# Patient Record
Sex: Female | Born: 1935 | Race: White | Hispanic: No | Smoking: Never smoker
Health system: Southern US, Community
[De-identification: ages and names within clinical notes are randomized; demographics above are authoritative.]

## PROBLEM LIST (undated history)

## (undated) DIAGNOSIS — D649 Anemia, unspecified: Secondary | ICD-10-CM

## (undated) DIAGNOSIS — J189 Pneumonia, unspecified organism: Secondary | ICD-10-CM

## (undated) DIAGNOSIS — N390 Urinary tract infection, site not specified: Secondary | ICD-10-CM

## (undated) DIAGNOSIS — F0280 Dementia in other diseases classified elsewhere without behavioral disturbance: Secondary | ICD-10-CM

## (undated) DIAGNOSIS — E785 Hyperlipidemia, unspecified: Secondary | ICD-10-CM

## (undated) DIAGNOSIS — I4821 Permanent atrial fibrillation: Secondary | ICD-10-CM

## (undated) DIAGNOSIS — K635 Polyp of colon: Secondary | ICD-10-CM

## (undated) DIAGNOSIS — I10 Essential (primary) hypertension: Secondary | ICD-10-CM

## (undated) DIAGNOSIS — J90 Pleural effusion, not elsewhere classified: Secondary | ICD-10-CM

## (undated) DIAGNOSIS — H269 Unspecified cataract: Secondary | ICD-10-CM

## (undated) DIAGNOSIS — I509 Heart failure, unspecified: Secondary | ICD-10-CM

## (undated) DIAGNOSIS — R413 Other amnesia: Secondary | ICD-10-CM

## (undated) DIAGNOSIS — I471 Supraventricular tachycardia: Secondary | ICD-10-CM

## (undated) DIAGNOSIS — I499 Cardiac arrhythmia, unspecified: Secondary | ICD-10-CM

## (undated) HISTORY — PX: UPPER GASTROINTESTINAL ENDOSCOPY: SHX188

## (undated) HISTORY — DX: Cardiac arrhythmia, unspecified: I49.9

## (undated) HISTORY — DX: Hyperlipidemia, unspecified: E78.5

## (undated) HISTORY — DX: Pneumonia, unspecified organism: J18.9

## (undated) HISTORY — DX: Dementia in other diseases classified elsewhere, unspecified severity, without behavioral disturbance, psychotic disturbance, mood disturbance, and anxiety: F02.80

## (undated) HISTORY — DX: Heart failure, unspecified: I50.9

## (undated) HISTORY — DX: Unspecified cataract: H26.9

## (undated) HISTORY — DX: Other amnesia: R41.3

## (undated) HISTORY — DX: Anemia, unspecified: D64.9

## (undated) HISTORY — DX: Supraventricular tachycardia: I47.1

## (undated) HISTORY — DX: Urinary tract infection, site not specified: N39.0

## (undated) HISTORY — DX: Essential (primary) hypertension: I10

## (undated) HISTORY — DX: Polyp of colon: K63.5

## (undated) HISTORY — PX: COLONOSCOPY: SHX174

## (undated) HISTORY — DX: Pleural effusion, not elsewhere classified: J90

---

## 1981-12-29 HISTORY — PX: APPENDECTOMY: SHX54

## 1981-12-29 HISTORY — PX: ABDOMINAL HYSTERECTOMY: SHX81

## 1998-09-26 ENCOUNTER — Ambulatory Visit (HOSPITAL_COMMUNITY): Admission: RE | Admit: 1998-09-26 | Discharge: 1998-09-26 | Payer: Self-pay | Admitting: Obstetrics & Gynecology

## 1999-04-22 ENCOUNTER — Other Ambulatory Visit: Admission: RE | Admit: 1999-04-22 | Discharge: 1999-04-22 | Payer: Self-pay | Admitting: Gynecology

## 2002-12-13 ENCOUNTER — Other Ambulatory Visit: Admission: RE | Admit: 2002-12-13 | Discharge: 2002-12-13 | Payer: Self-pay | Admitting: Gynecology

## 2003-04-10 ENCOUNTER — Encounter: Admission: RE | Admit: 2003-04-10 | Discharge: 2003-05-19 | Payer: Self-pay | Admitting: Orthopedic Surgery

## 2005-04-24 ENCOUNTER — Other Ambulatory Visit: Admission: RE | Admit: 2005-04-24 | Discharge: 2005-04-24 | Payer: Self-pay | Admitting: Gynecology

## 2009-12-29 HISTORY — PX: HEMIARTHROPLASTY HIP: SUR652

## 2010-12-11 ENCOUNTER — Inpatient Hospital Stay (HOSPITAL_COMMUNITY)
Admission: EM | Admit: 2010-12-11 | Discharge: 2010-12-16 | Payer: Self-pay | Source: Home / Self Care | Attending: Orthopedic Surgery | Admitting: Orthopedic Surgery

## 2011-03-10 LAB — PROTIME-INR
INR: 1.11 (ref 0.00–1.49)
INR: 1.19 (ref 0.00–1.49)
INR: 1.34 (ref 0.00–1.49)
INR: 1.35 (ref 0.00–1.49)
INR: 1.41 (ref 0.00–1.49)
Prothrombin Time: 14.5 seconds (ref 11.6–15.2)
Prothrombin Time: 15.3 seconds — ABNORMAL HIGH (ref 11.6–15.2)
Prothrombin Time: 16.8 seconds — ABNORMAL HIGH (ref 11.6–15.2)
Prothrombin Time: 16.9 seconds — ABNORMAL HIGH (ref 11.6–15.2)
Prothrombin Time: 17.5 seconds — ABNORMAL HIGH (ref 11.6–15.2)

## 2011-03-10 LAB — CBC
HCT: 28.3 % — ABNORMAL LOW (ref 36.0–46.0)
HCT: 29.6 % — ABNORMAL LOW (ref 36.0–46.0)
HCT: 30.5 % — ABNORMAL LOW (ref 36.0–46.0)
HCT: 32.3 % — ABNORMAL LOW (ref 36.0–46.0)
HCT: 33.9 % — ABNORMAL LOW (ref 36.0–46.0)
Hemoglobin: 10.5 g/dL — ABNORMAL LOW (ref 12.0–15.0)
Hemoglobin: 10.9 g/dL — ABNORMAL LOW (ref 12.0–15.0)
Hemoglobin: 9.2 g/dL — ABNORMAL LOW (ref 12.0–15.0)
Hemoglobin: 9.6 g/dL — ABNORMAL LOW (ref 12.0–15.0)
Hemoglobin: 9.8 g/dL — ABNORMAL LOW (ref 12.0–15.0)
MCH: 30.5 pg (ref 26.0–34.0)
MCH: 30.9 pg (ref 26.0–34.0)
MCH: 31 pg (ref 26.0–34.0)
MCH: 31.1 pg (ref 26.0–34.0)
MCH: 31.1 pg (ref 26.0–34.0)
MCHC: 32.1 g/dL (ref 30.0–36.0)
MCHC: 32.2 g/dL (ref 30.0–36.0)
MCHC: 32.4 g/dL (ref 30.0–36.0)
MCHC: 32.5 g/dL (ref 30.0–36.0)
MCHC: 32.5 g/dL (ref 30.0–36.0)
MCV: 95 fL (ref 78.0–100.0)
MCV: 95 fL (ref 78.0–100.0)
MCV: 95.3 fL (ref 78.0–100.0)
MCV: 95.8 fL (ref 78.0–100.0)
MCV: 96.6 fL (ref 78.0–100.0)
Platelets: 221 10*3/uL (ref 150–400)
Platelets: 256 10*3/uL (ref 150–400)
Platelets: 63 10*3/uL — ABNORMAL LOW (ref 150–400)
Platelets: 92 10*3/uL — ABNORMAL LOW (ref 150–400)
Platelets: ADEQUATE 10*3/uL (ref 150–400)
RBC: 2.98 MIL/uL — ABNORMAL LOW (ref 3.87–5.11)
RBC: 3.09 MIL/uL — ABNORMAL LOW (ref 3.87–5.11)
RBC: 3.21 MIL/uL — ABNORMAL LOW (ref 3.87–5.11)
RBC: 3.39 MIL/uL — ABNORMAL LOW (ref 3.87–5.11)
RBC: 3.51 MIL/uL — ABNORMAL LOW (ref 3.87–5.11)
RDW: 13 % (ref 11.5–15.5)
RDW: 13 % (ref 11.5–15.5)
RDW: 13.1 % (ref 11.5–15.5)
RDW: 13.1 % (ref 11.5–15.5)
RDW: 13.1 % (ref 11.5–15.5)
WBC: 11.9 10*3/uL — ABNORMAL HIGH (ref 4.0–10.5)
WBC: 13.2 10*3/uL — ABNORMAL HIGH (ref 4.0–10.5)
WBC: 8.4 10*3/uL (ref 4.0–10.5)
WBC: 9.8 10*3/uL (ref 4.0–10.5)
WBC: 9.9 10*3/uL (ref 4.0–10.5)

## 2011-03-10 LAB — BASIC METABOLIC PANEL
BUN: 6 mg/dL (ref 6–23)
BUN: 6 mg/dL (ref 6–23)
BUN: 8 mg/dL (ref 6–23)
CO2: 22 mEq/L (ref 19–32)
CO2: 23 mEq/L (ref 19–32)
CO2: 27 mEq/L (ref 19–32)
Calcium: 7.4 mg/dL — ABNORMAL LOW (ref 8.4–10.5)
Calcium: 7.5 mg/dL — ABNORMAL LOW (ref 8.4–10.5)
Calcium: 7.8 mg/dL — ABNORMAL LOW (ref 8.4–10.5)
Chloride: 106 mEq/L (ref 96–112)
Chloride: 108 mEq/L (ref 96–112)
Chloride: 111 mEq/L (ref 96–112)
Creatinine, Ser: 0.62 mg/dL (ref 0.4–1.2)
Creatinine, Ser: 0.66 mg/dL (ref 0.4–1.2)
Creatinine, Ser: 0.7 mg/dL (ref 0.4–1.2)
GFR calc Af Amer: >60 mL/min (ref >60)
GFR calc Af Amer: >60 mL/min (ref >60)
GFR calc Af Amer: >60 mL/min (ref >60)
GFR calc non Af Amer: >60 mL/min (ref >60)
GFR calc non Af Amer: >60 mL/min (ref >60)
GFR calc non Af Amer: >60 mL/min (ref >60)
Glucose, Bld: 109 mg/dL — ABNORMAL HIGH (ref 70–99)
Glucose, Bld: 113 mg/dL — ABNORMAL HIGH (ref 70–99)
Glucose, Bld: 114 mg/dL — ABNORMAL HIGH (ref 70–99)
Potassium: 3.7 mEq/L (ref 3.5–5.1)
Potassium: 3.9 mEq/L (ref 3.5–5.1)
Potassium: 4.1 mEq/L (ref 3.5–5.1)
Sodium: 137 mEq/L (ref 135–145)
Sodium: 139 mEq/L (ref 135–145)
Sodium: 140 mEq/L (ref 135–145)

## 2011-03-10 LAB — DIFFERENTIAL
Basophils Absolute: 0.1 10*3/uL (ref 0.0–0.1)
Basophils Relative: 1 % (ref 0–1)
Eosinophils Absolute: 0.4 10*3/uL (ref 0.0–0.7)
Eosinophils Relative: 5 % (ref 0–5)
Lymphocytes Relative: 19 % (ref 12–46)
Lymphs Abs: 1.6 10*3/uL (ref 0.7–4.0)
Monocytes Absolute: 0.6 10*3/uL (ref 0.1–1.0)
Monocytes Relative: 7 % (ref 3–12)
Neutro Abs: 5.7 10*3/uL (ref 1.7–7.7)
Neutrophils Relative %: 68 % (ref 43–77)

## 2011-03-10 LAB — SURGICAL PCR SCREEN
MRSA, PCR: NEGATIVE
Staphylococcus aureus: NEGATIVE

## 2011-03-10 LAB — CROSSMATCH
ABO/RH(D): B POS
Antibody Screen: NEGATIVE
Unit division: 0
Unit division: 0

## 2011-03-10 LAB — ABO/RH: ABO/RH(D): B POS

## 2011-03-11 LAB — CBC
HCT: 40.3 % (ref 36.0–46.0)
Hemoglobin: 13.3 g/dL (ref 12.0–15.0)
MCH: 31.3 pg (ref 26.0–34.0)
MCHC: 33 g/dL (ref 30.0–36.0)
MCV: 94.8 fL (ref 78.0–100.0)
RBC: 4.25 MIL/uL (ref 3.87–5.11)
RDW: 12.7 % (ref 11.5–15.5)
WBC: 20.4 10*3/uL — ABNORMAL HIGH (ref 4.0–10.5)

## 2011-03-11 LAB — DIFFERENTIAL
Basophils Absolute: 0 10*3/uL (ref 0.0–0.1)
Basophils Relative: 0 % (ref 0–1)
Eosinophils Absolute: 0 10*3/uL (ref 0.0–0.7)
Eosinophils Relative: 0 % (ref 0–5)
Lymphocytes Relative: 5 % — ABNORMAL LOW (ref 12–46)
Lymphs Abs: 1 10*3/uL (ref 0.7–4.0)
Monocytes Absolute: 0.8 10*3/uL (ref 0.1–1.0)
Monocytes Relative: 4 % (ref 3–12)
Neutro Abs: 18.6 10*3/uL — ABNORMAL HIGH (ref 1.7–7.7)
Neutrophils Relative %: 91 % — ABNORMAL HIGH (ref 43–77)
Smear Review: ADEQUATE

## 2011-03-11 LAB — COMPREHENSIVE METABOLIC PANEL
ALT: 21 U/L (ref 0–35)
AST: 27 U/L (ref 0–37)
Albumin: 3.8 g/dL (ref 3.5–5.2)
Alkaline Phosphatase: 55 U/L (ref 39–117)
BUN: 17 mg/dL (ref 6–23)
CO2: 26 mEq/L (ref 19–32)
Calcium: 8.9 mg/dL (ref 8.4–10.5)
Chloride: 104 mEq/L (ref 96–112)
Creatinine, Ser: 0.81 mg/dL (ref 0.4–1.2)
GFR calc Af Amer: >60 mL/min (ref >60)
GFR calc non Af Amer: >60 mL/min (ref >60)
Glucose, Bld: 141 mg/dL — ABNORMAL HIGH (ref 70–99)
Potassium: 4.3 mEq/L (ref 3.5–5.1)
Sodium: 139 mEq/L (ref 135–145)
Total Bilirubin: 0.7 mg/dL (ref 0.3–1.2)
Total Protein: 7.2 g/dL (ref 6.0–8.3)

## 2012-01-30 DIAGNOSIS — I471 Supraventricular tachycardia, unspecified: Secondary | ICD-10-CM

## 2012-01-30 DIAGNOSIS — J189 Pneumonia, unspecified organism: Secondary | ICD-10-CM

## 2012-01-30 DIAGNOSIS — J90 Pleural effusion, not elsewhere classified: Secondary | ICD-10-CM

## 2012-01-30 HISTORY — DX: Supraventricular tachycardia: I47.1

## 2012-01-30 HISTORY — DX: Pleural effusion, not elsewhere classified: J90

## 2012-01-30 HISTORY — DX: Pneumonia, unspecified organism: J18.9

## 2012-01-30 HISTORY — DX: Supraventricular tachycardia, unspecified: I47.10

## 2012-02-06 DIAGNOSIS — D649 Anemia, unspecified: Secondary | ICD-10-CM | POA: Diagnosis not present

## 2012-02-06 DIAGNOSIS — Z86010 Personal history of colonic polyps: Secondary | ICD-10-CM | POA: Diagnosis not present

## 2012-02-06 DIAGNOSIS — I739 Peripheral vascular disease, unspecified: Secondary | ICD-10-CM | POA: Diagnosis not present

## 2012-02-06 DIAGNOSIS — K625 Hemorrhage of anus and rectum: Secondary | ICD-10-CM | POA: Diagnosis not present

## 2012-02-08 DIAGNOSIS — J690 Pneumonitis due to inhalation of food and vomit: Secondary | ICD-10-CM | POA: Diagnosis not present

## 2012-02-08 DIAGNOSIS — I4891 Unspecified atrial fibrillation: Secondary | ICD-10-CM | POA: Diagnosis not present

## 2012-02-08 DIAGNOSIS — E7800 Pure hypercholesterolemia, unspecified: Secondary | ICD-10-CM | POA: Diagnosis not present

## 2012-02-08 DIAGNOSIS — M79609 Pain in unspecified limb: Secondary | ICD-10-CM | POA: Diagnosis not present

## 2012-02-08 DIAGNOSIS — R112 Nausea with vomiting, unspecified: Secondary | ICD-10-CM | POA: Diagnosis present

## 2012-02-08 DIAGNOSIS — R7402 Elevation of levels of lactic acid dehydrogenase (LDH): Secondary | ICD-10-CM | POA: Diagnosis not present

## 2012-02-08 DIAGNOSIS — I471 Supraventricular tachycardia, unspecified: Secondary | ICD-10-CM | POA: Diagnosis not present

## 2012-02-08 DIAGNOSIS — R197 Diarrhea, unspecified: Secondary | ICD-10-CM | POA: Diagnosis not present

## 2012-02-08 DIAGNOSIS — R079 Chest pain, unspecified: Secondary | ICD-10-CM | POA: Diagnosis not present

## 2012-02-08 DIAGNOSIS — I219 Acute myocardial infarction, unspecified: Secondary | ICD-10-CM | POA: Diagnosis not present

## 2012-02-08 DIAGNOSIS — J189 Pneumonia, unspecified organism: Secondary | ICD-10-CM | POA: Diagnosis not present

## 2012-02-08 DIAGNOSIS — K769 Liver disease, unspecified: Secondary | ICD-10-CM | POA: Diagnosis present

## 2012-02-08 DIAGNOSIS — I5033 Acute on chronic diastolic (congestive) heart failure: Secondary | ICD-10-CM | POA: Diagnosis not present

## 2012-02-08 DIAGNOSIS — I10 Essential (primary) hypertension: Secondary | ICD-10-CM | POA: Diagnosis not present

## 2012-02-08 DIAGNOSIS — I509 Heart failure, unspecified: Secondary | ICD-10-CM | POA: Diagnosis present

## 2012-02-08 DIAGNOSIS — D649 Anemia, unspecified: Secondary | ICD-10-CM | POA: Diagnosis not present

## 2012-02-08 DIAGNOSIS — Z8249 Family history of ischemic heart disease and other diseases of the circulatory system: Secondary | ICD-10-CM | POA: Diagnosis not present

## 2012-02-08 DIAGNOSIS — E871 Hypo-osmolality and hyponatremia: Secondary | ICD-10-CM | POA: Diagnosis not present

## 2012-02-08 DIAGNOSIS — K5289 Other specified noninfective gastroenteritis and colitis: Secondary | ICD-10-CM | POA: Diagnosis not present

## 2012-02-08 DIAGNOSIS — D509 Iron deficiency anemia, unspecified: Secondary | ICD-10-CM | POA: Diagnosis not present

## 2012-02-08 DIAGNOSIS — I959 Hypotension, unspecified: Secondary | ICD-10-CM | POA: Diagnosis not present

## 2012-02-08 DIAGNOSIS — I5031 Acute diastolic (congestive) heart failure: Secondary | ICD-10-CM | POA: Diagnosis not present

## 2012-02-08 DIAGNOSIS — J90 Pleural effusion, not elsewhere classified: Secondary | ICD-10-CM | POA: Diagnosis present

## 2012-02-08 DIAGNOSIS — R7401 Elevation of levels of liver transaminase levels: Secondary | ICD-10-CM | POA: Diagnosis not present

## 2012-02-08 DIAGNOSIS — R0602 Shortness of breath: Secondary | ICD-10-CM | POA: Diagnosis not present

## 2012-02-08 DIAGNOSIS — D638 Anemia in other chronic diseases classified elsewhere: Secondary | ICD-10-CM | POA: Diagnosis not present

## 2012-02-08 DIAGNOSIS — I501 Left ventricular failure: Secondary | ICD-10-CM | POA: Diagnosis not present

## 2012-02-08 DIAGNOSIS — J45909 Unspecified asthma, uncomplicated: Secondary | ICD-10-CM | POA: Diagnosis not present

## 2012-02-08 DIAGNOSIS — D72829 Elevated white blood cell count, unspecified: Secondary | ICD-10-CM | POA: Diagnosis not present

## 2012-02-08 DIAGNOSIS — E782 Mixed hyperlipidemia: Secondary | ICD-10-CM | POA: Diagnosis present

## 2012-02-08 DIAGNOSIS — R109 Unspecified abdominal pain: Secondary | ICD-10-CM | POA: Diagnosis not present

## 2012-02-08 DIAGNOSIS — K5730 Diverticulosis of large intestine without perforation or abscess without bleeding: Secondary | ICD-10-CM | POA: Diagnosis present

## 2012-02-08 DIAGNOSIS — R945 Abnormal results of liver function studies: Secondary | ICD-10-CM | POA: Diagnosis not present

## 2012-02-08 DIAGNOSIS — K625 Hemorrhage of anus and rectum: Secondary | ICD-10-CM | POA: Diagnosis present

## 2012-02-17 DIAGNOSIS — I509 Heart failure, unspecified: Secondary | ICD-10-CM | POA: Diagnosis not present

## 2012-02-17 DIAGNOSIS — J189 Pneumonia, unspecified organism: Secondary | ICD-10-CM | POA: Diagnosis not present

## 2012-02-23 DIAGNOSIS — I471 Supraventricular tachycardia: Secondary | ICD-10-CM | POA: Diagnosis not present

## 2012-02-26 DIAGNOSIS — I471 Supraventricular tachycardia: Secondary | ICD-10-CM | POA: Diagnosis not present

## 2012-02-26 DIAGNOSIS — I10 Essential (primary) hypertension: Secondary | ICD-10-CM | POA: Diagnosis not present

## 2012-02-26 DIAGNOSIS — I2510 Atherosclerotic heart disease of native coronary artery without angina pectoris: Secondary | ICD-10-CM | POA: Diagnosis not present

## 2012-02-27 DIAGNOSIS — I2510 Atherosclerotic heart disease of native coronary artery without angina pectoris: Secondary | ICD-10-CM | POA: Diagnosis not present

## 2012-03-02 DIAGNOSIS — K922 Gastrointestinal hemorrhage, unspecified: Secondary | ICD-10-CM | POA: Diagnosis not present

## 2012-03-02 DIAGNOSIS — I509 Heart failure, unspecified: Secondary | ICD-10-CM | POA: Diagnosis not present

## 2012-03-03 DIAGNOSIS — E785 Hyperlipidemia, unspecified: Secondary | ICD-10-CM | POA: Diagnosis not present

## 2012-03-03 DIAGNOSIS — R7989 Other specified abnormal findings of blood chemistry: Secondary | ICD-10-CM | POA: Diagnosis not present

## 2012-03-05 DIAGNOSIS — I10 Essential (primary) hypertension: Secondary | ICD-10-CM | POA: Diagnosis not present

## 2012-03-05 DIAGNOSIS — I2510 Atherosclerotic heart disease of native coronary artery without angina pectoris: Secondary | ICD-10-CM | POA: Diagnosis not present

## 2012-03-05 DIAGNOSIS — I471 Supraventricular tachycardia: Secondary | ICD-10-CM | POA: Diagnosis not present

## 2012-03-05 DIAGNOSIS — K922 Gastrointestinal hemorrhage, unspecified: Secondary | ICD-10-CM | POA: Diagnosis not present

## 2012-03-12 DIAGNOSIS — M5430 Sciatica, unspecified side: Secondary | ICD-10-CM | POA: Diagnosis not present

## 2012-03-16 DIAGNOSIS — J189 Pneumonia, unspecified organism: Secondary | ICD-10-CM | POA: Diagnosis not present

## 2012-04-05 ENCOUNTER — Encounter: Payer: Self-pay | Admitting: Internal Medicine

## 2012-04-05 DIAGNOSIS — Z1289 Encounter for screening for malignant neoplasm of other sites: Secondary | ICD-10-CM | POA: Diagnosis not present

## 2012-04-05 DIAGNOSIS — Z1212 Encounter for screening for malignant neoplasm of rectum: Secondary | ICD-10-CM | POA: Diagnosis not present

## 2012-04-13 DIAGNOSIS — I471 Supraventricular tachycardia: Secondary | ICD-10-CM | POA: Diagnosis not present

## 2012-04-13 DIAGNOSIS — M810 Age-related osteoporosis without current pathological fracture: Secondary | ICD-10-CM | POA: Diagnosis not present

## 2012-04-13 DIAGNOSIS — E785 Hyperlipidemia, unspecified: Secondary | ICD-10-CM | POA: Diagnosis not present

## 2012-04-13 DIAGNOSIS — I10 Essential (primary) hypertension: Secondary | ICD-10-CM | POA: Diagnosis not present

## 2012-04-27 ENCOUNTER — Encounter: Payer: Self-pay | Admitting: Internal Medicine

## 2012-04-27 ENCOUNTER — Ambulatory Visit (INDEPENDENT_AMBULATORY_CARE_PROVIDER_SITE_OTHER): Payer: Medicare Other | Admitting: Internal Medicine

## 2012-04-27 VITALS — BP 138/58 | HR 64 | Ht 64.0 in | Wt 136.0 lb

## 2012-04-27 DIAGNOSIS — K625 Hemorrhage of anus and rectum: Secondary | ICD-10-CM | POA: Diagnosis not present

## 2012-04-27 DIAGNOSIS — D649 Anemia, unspecified: Secondary | ICD-10-CM | POA: Diagnosis not present

## 2012-04-27 DIAGNOSIS — K648 Other hemorrhoids: Secondary | ICD-10-CM | POA: Diagnosis not present

## 2012-04-27 NOTE — Progress Notes (Addendum)
Subjective:    Patient ID: [name] Dyer, female    DOB: February 01, 1932, 78 y.o.   MRN: [id number] Referred by: Thressa Sheller, MD  HPI This is a pleasant, married elderly white woman who had problems with pneumonia and also had rectal bleeding while hospitalized in [place] in February.Hospitalized from February 10 -15 , [facility] in [place] She goes there for the winter. She actually noticed some intermittent right red blood on the toilet paper in in to the toilet along with a normal bowel movement before she was admitted to the hospital in February. . She says her bowel movements have not changed, she had not been having any straining or large bowel movements or constipation. She then developed a respiratory disturbance and was admitted and had a left lower lobe community-acquired pneumonia with a right sided pleural effusion status post thoracentesis 150 cc. There was some transient diarrhea and she had rectal bleeding while there. Her heparin was discontinued. She had supraventricular tachycardia and congestive heart failure problems while there. A GI physician saw her. I have reviewed hospital admission notes discharge summaries as well as consultation notes in labs. She had a hemoglobin of 12.4 that went to 10.7 and was stable. The bleeding stopped. She had iron studies and celiac studies, TTG antibody was negative. Her TIBC was 3:15 which is in the mid range, iron was 16 and saturation 5% with low, transferrin 225. B12 and folate were normal.  She has not had any bleeding since hospitalization she says she feels fine and is back to baseline. She is not having any abdominal pain.  She had previously had a colonoscopy because of melena, also had an EGD. This was in 2011, on March 10. I reviewed those records though I do not have pathology. She had a suspected healing ulcer in the antrum of the stomach and some cobblestoning and erythema in the body of the stomach then. A  colonoscopy demonstrated large internal and external hemorrhoids, small sessile polyps of the rectum and the descending colon that were removed. She also had left-sided diverticulosis.  All other GI review of systems negative.  Her son is a patient of mine.  Review of Systems This is positive for recent supraventricular tachycardia, pedal edema, urination increase and hot flashes. There's been some insomnia as well.    Objective:   Physical Exam General:  Well-developed, well-nourished and in no acute distress Eyes:  anicteric. ENT:   Mouth and posterior pharynx free of lesions.  Neck:   supple w/o thyromegaly or mass.  Lungs: Clear to auscultation bilaterally. Heart:  S1S2, no rubs, murmurs, gallops. Abdomen:  soft, non-tender, no hepatosplenomegaly, hernia, or mass and BS+.  Rectal: Female staff present - one tag, no mass or stool. Nontender. Anoscopy - swollen-inflamed hemorrhoids in anal canal Lymph:  no cervical or supraclavicular adenopathy. Extremities:   no edema Skin   no rash. Neuro:  A&O x 3.  Psych:  appropriate mood and  Affect.   Data Reviewed: Hospital records as outlined in the history of present illness. This includes admission notes, consult notes, discharge summaries, laboratory reports. She is also given the EGD and colonoscopy reports from 2011.       Assessment & Plan:   1. Rectal bleeding   2. Bleeding internal hemorrhoids   It certainly sounds like the rectal bleeding is from internal hemorrhoids. This is the most likely scenario, and has stopped. She is not inclined to pursue colonoscopy to evaluate this. She had  one in 2011 and I reviewed the images and the report. I am somewhat concerned about the anemia, it could be iron deficiency though there may be a mixed picture based upon the iron studies i.e. she could S. and chronic disease-type features. I want to see what her ferritin is. If it is low, she could need to have additional testing, make sure that  she hasn't had a missed neoplastic lesion causing the bleeding.   3. Anemia, unspecified   Normocytic, hemoglobin went from 12 to10 range while hospitalized. She had rectal bleeding. Iron studies show a low saturation with a TIBC that is in the middle range. I don't have a ferritin. She had labs checked with Dr. Noah Delaine recently so we will request those. If she is iron deficient, repeating an endoscopic workup is reasonable and I will discuss that with her.    CC: [name], MD  Did not have labs at April visit. Hgb 13 MCV 93 in 10/2011  Will order CBC and ferritin

## 2012-04-27 NOTE — Patient Instructions (Signed)
We have given you some information on hemorrhoids.  We will be obtaining your most recent labs from Dr. Noah Delaine.  If you have not heard back from [place] with an update within 2 weeks, please give [place] a call.

## 2012-05-03 ENCOUNTER — Telehealth: Payer: Self-pay

## 2012-05-03 ENCOUNTER — Other Ambulatory Visit (INDEPENDENT_AMBULATORY_CARE_PROVIDER_SITE_OTHER): Payer: Medicare Other

## 2012-05-03 DIAGNOSIS — D649 Anemia, unspecified: Secondary | ICD-10-CM | POA: Diagnosis not present

## 2012-05-03 LAB — CBC WITH DIFFERENTIAL/PLATELET
Basophils Absolute: 0.1 10*3/uL (ref 0.0–0.1)
Basophils Relative: 0.8 % (ref 0.0–3.0)
Eosinophils Absolute: 0.2 10*3/uL (ref 0.0–0.7)
Eosinophils Relative: 2.8 % (ref 0.0–5.0)
HCT: 38.3 % (ref 36.0–46.0)
Hemoglobin: 12.7 g/dL (ref 12.0–15.0)
Lymphocytes Relative: 26.3 % (ref 12.0–46.0)
Lymphs Abs: 1.8 10*3/uL (ref 0.7–4.0)
MCHC: 33.1 g/dL (ref 30.0–36.0)
MCV: 92.1 fl (ref 78.0–100.0)
Monocytes Absolute: 0.7 10*3/uL (ref 0.1–1.0)
Monocytes Relative: 10.5 % (ref 3.0–12.0)
Neutro Abs: 4 10*3/uL (ref 1.4–7.7)
Neutrophils Relative %: 59.6 % (ref 43.0–77.0)
Platelets: 398 10*3/uL (ref 150.0–400.0)
RBC: 4.16 Mil/uL (ref 3.87–5.11)
RDW: 14.6 % (ref 11.5–14.6)
WBC: 6.8 10*3/uL (ref 4.5–10.5)

## 2012-05-03 LAB — FERRITIN: Ferritin: 46.7 ng/mL (ref 10.0–291.0)

## 2012-05-03 NOTE — Telephone Encounter (Signed)
Message copied by [place], Rhina Kramme E on Mon May 03, 2012 10:03 AM ------      Message from: Gatha Mayer      Created: Fri Apr 30, 2012  5:15 PM      Regarding: needs labs       She needs a CBC and ferritin re: anemia 285.9      Tell her we received records and last labs at PCP were November

## 2012-05-04 NOTE — Progress Notes (Signed)
Quick Note:  Hemoglobin and iron levels are normal though the iron is in the low normal range. I don't think further testing is needed. Taking a daily iron supplement for the next 3 months is reasonable, ferrous sulfate 325 mg daily is recommended. Please notify the patient.  Please route a faxed copy to her PCP, [name], MD, MD when completed.  ______

## 2012-05-05 NOTE — Progress Notes (Signed)
Faxed CBC, Ferritin results to Dr. Thressa Sheller at [phone] from 05-03-12 per Dr. Celesta Aver  Instructions.

## 2012-08-26 DIAGNOSIS — M949 Disorder of cartilage, unspecified: Secondary | ICD-10-CM | POA: Diagnosis not present

## 2012-08-26 DIAGNOSIS — M899 Disorder of bone, unspecified: Secondary | ICD-10-CM | POA: Diagnosis not present

## 2012-08-26 DIAGNOSIS — Z803 Family history of malignant neoplasm of breast: Secondary | ICD-10-CM | POA: Diagnosis not present

## 2012-08-26 DIAGNOSIS — Z1231 Encounter for screening mammogram for malignant neoplasm of breast: Secondary | ICD-10-CM | POA: Diagnosis not present

## 2012-09-15 DIAGNOSIS — Z23 Encounter for immunization: Secondary | ICD-10-CM | POA: Diagnosis not present

## 2012-10-25 DIAGNOSIS — N8110 Cystocele, unspecified: Secondary | ICD-10-CM | POA: Diagnosis not present

## 2012-11-02 DIAGNOSIS — I471 Supraventricular tachycardia: Secondary | ICD-10-CM | POA: Diagnosis not present

## 2012-11-02 DIAGNOSIS — E785 Hyperlipidemia, unspecified: Secondary | ICD-10-CM | POA: Diagnosis not present

## 2012-11-02 DIAGNOSIS — I10 Essential (primary) hypertension: Secondary | ICD-10-CM | POA: Diagnosis not present

## 2012-11-02 DIAGNOSIS — Z0000 Encounter for general adult medical examination without abnormal findings: Secondary | ICD-10-CM | POA: Diagnosis not present

## 2012-11-02 DIAGNOSIS — M810 Age-related osteoporosis without current pathological fracture: Secondary | ICD-10-CM | POA: Diagnosis not present

## 2012-11-02 DIAGNOSIS — Z1331 Encounter for screening for depression: Secondary | ICD-10-CM | POA: Diagnosis not present

## 2012-11-09 DIAGNOSIS — J309 Allergic rhinitis, unspecified: Secondary | ICD-10-CM | POA: Diagnosis not present

## 2012-11-09 DIAGNOSIS — M810 Age-related osteoporosis without current pathological fracture: Secondary | ICD-10-CM | POA: Diagnosis not present

## 2012-11-09 DIAGNOSIS — E785 Hyperlipidemia, unspecified: Secondary | ICD-10-CM | POA: Diagnosis not present

## 2012-11-09 DIAGNOSIS — I10 Essential (primary) hypertension: Secondary | ICD-10-CM | POA: Diagnosis not present

## 2012-12-17 DIAGNOSIS — E785 Hyperlipidemia, unspecified: Secondary | ICD-10-CM | POA: Diagnosis not present

## 2012-12-17 DIAGNOSIS — I471 Supraventricular tachycardia: Secondary | ICD-10-CM | POA: Diagnosis not present

## 2012-12-17 DIAGNOSIS — I10 Essential (primary) hypertension: Secondary | ICD-10-CM | POA: Diagnosis not present

## 2012-12-17 DIAGNOSIS — J209 Acute bronchitis, unspecified: Secondary | ICD-10-CM | POA: Diagnosis not present

## 2013-04-26 DIAGNOSIS — H43819 Vitreous degeneration, unspecified eye: Secondary | ICD-10-CM | POA: Diagnosis not present

## 2013-04-26 DIAGNOSIS — H35369 Drusen (degenerative) of macula, unspecified eye: Secondary | ICD-10-CM | POA: Diagnosis not present

## 2013-04-26 DIAGNOSIS — H2510 Age-related nuclear cataract, unspecified eye: Secondary | ICD-10-CM | POA: Diagnosis not present

## 2013-05-02 DIAGNOSIS — E785 Hyperlipidemia, unspecified: Secondary | ICD-10-CM | POA: Diagnosis not present

## 2013-05-02 DIAGNOSIS — I10 Essential (primary) hypertension: Secondary | ICD-10-CM | POA: Diagnosis not present

## 2013-05-02 DIAGNOSIS — M810 Age-related osteoporosis without current pathological fracture: Secondary | ICD-10-CM | POA: Diagnosis not present

## 2013-05-04 DIAGNOSIS — D485 Neoplasm of uncertain behavior of skin: Secondary | ICD-10-CM | POA: Diagnosis not present

## 2013-05-04 DIAGNOSIS — D235 Other benign neoplasm of skin of trunk: Secondary | ICD-10-CM | POA: Diagnosis not present

## 2013-05-16 DIAGNOSIS — E785 Hyperlipidemia, unspecified: Secondary | ICD-10-CM | POA: Diagnosis not present

## 2013-05-16 DIAGNOSIS — M810 Age-related osteoporosis without current pathological fracture: Secondary | ICD-10-CM | POA: Diagnosis not present

## 2013-05-16 DIAGNOSIS — J309 Allergic rhinitis, unspecified: Secondary | ICD-10-CM | POA: Diagnosis not present

## 2013-05-16 DIAGNOSIS — I10 Essential (primary) hypertension: Secondary | ICD-10-CM | POA: Diagnosis not present

## 2013-07-18 DIAGNOSIS — N8110 Cystocele, unspecified: Secondary | ICD-10-CM | POA: Diagnosis not present

## 2013-07-18 DIAGNOSIS — N951 Menopausal and female climacteric states: Secondary | ICD-10-CM | POA: Diagnosis not present

## 2013-07-18 DIAGNOSIS — M810 Age-related osteoporosis without current pathological fracture: Secondary | ICD-10-CM | POA: Diagnosis not present

## 2013-07-18 DIAGNOSIS — Z1212 Encounter for screening for malignant neoplasm of rectum: Secondary | ICD-10-CM | POA: Diagnosis not present

## 2013-08-30 DIAGNOSIS — Z1231 Encounter for screening mammogram for malignant neoplasm of breast: Secondary | ICD-10-CM | POA: Diagnosis not present

## 2013-09-20 DIAGNOSIS — Z23 Encounter for immunization: Secondary | ICD-10-CM | POA: Diagnosis not present

## 2013-11-16 DIAGNOSIS — Z1331 Encounter for screening for depression: Secondary | ICD-10-CM | POA: Diagnosis not present

## 2013-11-16 DIAGNOSIS — M810 Age-related osteoporosis without current pathological fracture: Secondary | ICD-10-CM | POA: Diagnosis not present

## 2013-11-16 DIAGNOSIS — D509 Iron deficiency anemia, unspecified: Secondary | ICD-10-CM | POA: Diagnosis not present

## 2013-11-16 DIAGNOSIS — I10 Essential (primary) hypertension: Secondary | ICD-10-CM | POA: Diagnosis not present

## 2013-11-16 DIAGNOSIS — Z0000 Encounter for general adult medical examination without abnormal findings: Secondary | ICD-10-CM | POA: Diagnosis not present

## 2013-11-16 DIAGNOSIS — E785 Hyperlipidemia, unspecified: Secondary | ICD-10-CM | POA: Diagnosis not present

## 2013-11-23 DIAGNOSIS — D696 Thrombocytopenia, unspecified: Secondary | ICD-10-CM | POA: Diagnosis not present

## 2013-11-23 DIAGNOSIS — I10 Essential (primary) hypertension: Secondary | ICD-10-CM | POA: Diagnosis not present

## 2013-11-23 DIAGNOSIS — E785 Hyperlipidemia, unspecified: Secondary | ICD-10-CM | POA: Diagnosis not present

## 2013-11-23 DIAGNOSIS — M810 Age-related osteoporosis without current pathological fracture: Secondary | ICD-10-CM | POA: Diagnosis not present

## 2013-11-23 DIAGNOSIS — J309 Allergic rhinitis, unspecified: Secondary | ICD-10-CM | POA: Diagnosis not present

## 2014-05-08 DIAGNOSIS — D235 Other benign neoplasm of skin of trunk: Secondary | ICD-10-CM | POA: Diagnosis not present

## 2014-05-15 DIAGNOSIS — M810 Age-related osteoporosis without current pathological fracture: Secondary | ICD-10-CM | POA: Diagnosis not present

## 2014-05-15 DIAGNOSIS — I10 Essential (primary) hypertension: Secondary | ICD-10-CM | POA: Diagnosis not present

## 2014-05-29 DIAGNOSIS — D696 Thrombocytopenia, unspecified: Secondary | ICD-10-CM | POA: Diagnosis not present

## 2014-05-29 DIAGNOSIS — I10 Essential (primary) hypertension: Secondary | ICD-10-CM | POA: Diagnosis not present

## 2014-05-29 DIAGNOSIS — E785 Hyperlipidemia, unspecified: Secondary | ICD-10-CM | POA: Diagnosis not present

## 2014-05-29 DIAGNOSIS — I471 Supraventricular tachycardia: Secondary | ICD-10-CM | POA: Diagnosis not present

## 2014-05-29 DIAGNOSIS — M810 Age-related osteoporosis without current pathological fracture: Secondary | ICD-10-CM | POA: Diagnosis not present

## 2014-09-08 DIAGNOSIS — M949 Disorder of cartilage, unspecified: Secondary | ICD-10-CM | POA: Diagnosis not present

## 2014-09-08 DIAGNOSIS — Z803 Family history of malignant neoplasm of breast: Secondary | ICD-10-CM | POA: Diagnosis not present

## 2014-09-08 DIAGNOSIS — Z1231 Encounter for screening mammogram for malignant neoplasm of breast: Secondary | ICD-10-CM | POA: Diagnosis not present

## 2014-09-08 DIAGNOSIS — M899 Disorder of bone, unspecified: Secondary | ICD-10-CM | POA: Diagnosis not present

## 2014-09-28 DIAGNOSIS — Z1212 Encounter for screening for malignant neoplasm of rectum: Secondary | ICD-10-CM | POA: Diagnosis not present

## 2014-09-28 DIAGNOSIS — Z1289 Encounter for screening for malignant neoplasm of other sites: Secondary | ICD-10-CM | POA: Diagnosis not present

## 2014-10-19 DIAGNOSIS — Z23 Encounter for immunization: Secondary | ICD-10-CM | POA: Diagnosis not present

## 2014-11-14 DIAGNOSIS — H25813 Combined forms of age-related cataract, bilateral: Secondary | ICD-10-CM | POA: Diagnosis not present

## 2014-11-28 DIAGNOSIS — Z1389 Encounter for screening for other disorder: Secondary | ICD-10-CM | POA: Diagnosis not present

## 2014-11-28 DIAGNOSIS — I10 Essential (primary) hypertension: Secondary | ICD-10-CM | POA: Diagnosis not present

## 2014-11-28 DIAGNOSIS — M810 Age-related osteoporosis without current pathological fracture: Secondary | ICD-10-CM | POA: Diagnosis not present

## 2014-11-28 DIAGNOSIS — Z0000 Encounter for general adult medical examination without abnormal findings: Secondary | ICD-10-CM | POA: Diagnosis not present

## 2014-12-05 DIAGNOSIS — N390 Urinary tract infection, site not specified: Secondary | ICD-10-CM | POA: Diagnosis not present

## 2014-12-05 DIAGNOSIS — M810 Age-related osteoporosis without current pathological fracture: Secondary | ICD-10-CM | POA: Diagnosis not present

## 2014-12-05 DIAGNOSIS — E785 Hyperlipidemia, unspecified: Secondary | ICD-10-CM | POA: Diagnosis not present

## 2014-12-05 DIAGNOSIS — N3281 Overactive bladder: Secondary | ICD-10-CM | POA: Diagnosis not present

## 2014-12-05 DIAGNOSIS — I10 Essential (primary) hypertension: Secondary | ICD-10-CM | POA: Diagnosis not present

## 2014-12-05 DIAGNOSIS — I471 Supraventricular tachycardia: Secondary | ICD-10-CM | POA: Diagnosis not present

## 2014-12-16 ENCOUNTER — Ambulatory Visit (INDEPENDENT_AMBULATORY_CARE_PROVIDER_SITE_OTHER): Payer: Medicare Other | Admitting: Family Medicine

## 2014-12-16 VITALS — BP 106/32 | HR 71 | Temp 97.6°F | Resp 16 | Ht 64.25 in | Wt 133.6 lb

## 2014-12-16 DIAGNOSIS — T490X5A Adverse effect of local antifungal, anti-infective and anti-inflammatory drugs, initial encounter: Secondary | ICD-10-CM

## 2014-12-16 MED ORDER — METHYLPREDNISOLONE ACETATE 80 MG/ML IJ SUSP
80.0000 mg | Freq: Once | INTRAMUSCULAR | Status: AC
  Administered 2014-12-16: 80 mg via INTRAMUSCULAR

## 2014-12-16 MED ORDER — PREDNISONE 20 MG PO TABS: 40.0000 mg | ORAL_TABLET | Freq: Every day | ORAL | Status: DC

## 2014-12-16 NOTE — Progress Notes (Signed)
This is an 80 year old woman who just completed a seven-day course of Septra. Last night she developed some itching and by this morning she had a total body rash with a sore throat and ulcerations in her posterior pharynx. She thinks she is less itchy now.  Objective: Patient has ulcerations in her posterior pharynx but her conjunctiva are normal She has a total body maculopapular rash  [name] in no acute distress  Assessment: Acute allergic reaction bordering on a Stevens-Johnson reaction  Plan: Depo-Medrol 80 IM followed by prednisone 40 mg daily for 3 days  Signed, Carola Frost.D.

## 2014-12-16 NOTE — Patient Instructions (Signed)

## 2014-12-19 DIAGNOSIS — Z882 Allergy status to sulfonamides status: Secondary | ICD-10-CM | POA: Diagnosis not present

## 2014-12-19 DIAGNOSIS — R21 Rash and other nonspecific skin eruption: Secondary | ICD-10-CM | POA: Diagnosis not present

## 2014-12-19 DIAGNOSIS — L299 Pruritus, unspecified: Secondary | ICD-10-CM | POA: Diagnosis not present

## 2015-01-09 DIAGNOSIS — H25813 Combined forms of age-related cataract, bilateral: Secondary | ICD-10-CM | POA: Diagnosis not present

## 2015-01-15 DIAGNOSIS — H25812 Combined forms of age-related cataract, left eye: Secondary | ICD-10-CM | POA: Diagnosis not present

## 2015-01-15 DIAGNOSIS — H2512 Age-related nuclear cataract, left eye: Secondary | ICD-10-CM | POA: Diagnosis not present

## 2015-01-23 DIAGNOSIS — H25811 Combined forms of age-related cataract, right eye: Secondary | ICD-10-CM | POA: Diagnosis not present

## 2015-01-29 DIAGNOSIS — H2511 Age-related nuclear cataract, right eye: Secondary | ICD-10-CM | POA: Diagnosis not present

## 2015-01-29 DIAGNOSIS — H25811 Combined forms of age-related cataract, right eye: Secondary | ICD-10-CM | POA: Diagnosis not present

## 2015-02-19 DIAGNOSIS — N760 Acute vaginitis: Secondary | ICD-10-CM | POA: Diagnosis not present

## 2015-02-19 DIAGNOSIS — R8290 Unspecified abnormal findings in urine: Secondary | ICD-10-CM | POA: Diagnosis not present

## 2015-03-08 DIAGNOSIS — R635 Abnormal weight gain: Secondary | ICD-10-CM | POA: Diagnosis not present

## 2015-03-08 DIAGNOSIS — I10 Essential (primary) hypertension: Secondary | ICD-10-CM | POA: Diagnosis not present

## 2015-03-08 DIAGNOSIS — E785 Hyperlipidemia, unspecified: Secondary | ICD-10-CM | POA: Diagnosis not present

## 2015-03-08 DIAGNOSIS — R5383 Other fatigue: Secondary | ICD-10-CM | POA: Diagnosis not present

## 2015-03-08 DIAGNOSIS — R609 Edema, unspecified: Secondary | ICD-10-CM | POA: Diagnosis not present

## 2015-03-08 DIAGNOSIS — R0789 Other chest pain: Secondary | ICD-10-CM | POA: Diagnosis not present

## 2015-03-08 DIAGNOSIS — J811 Chronic pulmonary edema: Secondary | ICD-10-CM | POA: Diagnosis not present

## 2015-03-08 DIAGNOSIS — J90 Pleural effusion, not elsewhere classified: Secondary | ICD-10-CM | POA: Diagnosis not present

## 2015-03-12 ENCOUNTER — Other Ambulatory Visit: Payer: Self-pay | Admitting: Internal Medicine

## 2015-03-12 DIAGNOSIS — R7989 Other specified abnormal findings of blood chemistry: Secondary | ICD-10-CM | POA: Diagnosis not present

## 2015-03-12 DIAGNOSIS — I509 Heart failure, unspecified: Secondary | ICD-10-CM | POA: Diagnosis not present

## 2015-03-12 DIAGNOSIS — R609 Edema, unspecified: Secondary | ICD-10-CM | POA: Diagnosis not present

## 2015-03-12 DIAGNOSIS — R945 Abnormal results of liver function studies: Principal | ICD-10-CM

## 2015-03-14 ENCOUNTER — Encounter: Payer: Self-pay | Admitting: Internal Medicine

## 2015-03-16 ENCOUNTER — Ambulatory Visit (INDEPENDENT_AMBULATORY_CARE_PROVIDER_SITE_OTHER): Payer: Medicare Other | Admitting: Internal Medicine

## 2015-03-16 ENCOUNTER — Encounter: Payer: Self-pay | Admitting: Internal Medicine

## 2015-03-16 ENCOUNTER — Ambulatory Visit
Admission: RE | Admit: 2015-03-16 | Discharge: 2015-03-16 | Disposition: A | Discharge status: Home / Self Care | Payer: Medicare Other | Source: Ambulatory Visit | Attending: Internal Medicine | Admitting: Internal Medicine

## 2015-03-16 VITALS — BP 122/56 | HR 60 | Ht 64.25 in | Wt 132.4 lb

## 2015-03-16 DIAGNOSIS — Z8679 Personal history of other diseases of the circulatory system: Secondary | ICD-10-CM

## 2015-03-16 DIAGNOSIS — R7989 Other specified abnormal findings of blood chemistry: Secondary | ICD-10-CM | POA: Diagnosis not present

## 2015-03-16 DIAGNOSIS — R0602 Shortness of breath: Secondary | ICD-10-CM

## 2015-03-16 DIAGNOSIS — R609 Edema, unspecified: Secondary | ICD-10-CM | POA: Diagnosis not present

## 2015-03-16 DIAGNOSIS — R945 Abnormal results of liver function studies: Principal | ICD-10-CM

## 2015-03-16 LAB — CBC
HCT: 37.4 % (ref 36.0–46.0)
Hemoglobin: 12.4 g/dL (ref 12.0–15.0)
MCH: 30.3 pg (ref 26.0–34.0)
MCHC: 33.2 g/dL (ref 30.0–36.0)
MCV: 91.4 fL (ref 78.0–100.0)
MPV: 11.4 fL (ref 8.6–12.4)
Platelets: 187 10*3/uL (ref 150–400)
RBC: 4.09 MIL/uL (ref 3.87–5.11)
RDW: 14.6 % (ref 11.5–15.5)
WBC: 10 10*3/uL (ref 4.0–10.5)

## 2015-03-16 LAB — BASIC METABOLIC PANEL
BUN: 21 mg/dL (ref 6–23)
CO2: 27 mEq/L (ref 19–32)
Calcium: 8.9 mg/dL (ref 8.4–10.5)
Chloride: 102 mEq/L (ref 96–112)
Creat: 0.72 mg/dL (ref 0.50–1.10)
Glucose, Bld: 84 mg/dL (ref 70–99)
Potassium: 4 mEq/L (ref 3.5–5.3)
Sodium: 138 mEq/L (ref 135–145)

## 2015-03-16 LAB — BRAIN NATRIURETIC PEPTIDE: Brain Natriuretic Peptide: 201.3 pg/mL — ABNORMAL HIGH (ref 0.0–100.0)

## 2015-03-16 NOTE — Patient Instructions (Signed)
Your physician has requested that you have an echocardiogram. Echocardiography is a painless test that uses sound waves to create images of your heart. It provides your doctor with information about the size and shape of your heart and how well your heart's chambers and valves are working. This procedure takes approximately one hour. There are no restrictions for this procedure.  Your physician recommends that you return for lab work in: today (bnp, bmet, cbc)

## 2015-03-18 NOTE — Progress Notes (Signed)
Cardiology Office Note   Date:  03/18/2015   ID:  [name] Dyer, DOB 1932/10/13, MRN [id number]  PCP:  [name], MD  Cardiologist:   [name], MD   No chief complaint on file. Patient referred for SOB, edema    History of Present Illness: [name] Dyer is a 81 y.o. female with a history of CHF  She spends part of year in [place]  Was seen in [place] a few years ago  [place] for 1 week  Had rapid HR at time  (? SVT)  [name] says she had 1 spell heart racing similar to in past in Dec and one on 3/1   Pateint was seen on 3/0 by Dr Noah Delaine  Complained of chest tightness and LE swelling   Felt rattling in chest.  Said similar to how felt in [place]   CXR showed mild edema  Given Lasxix Patient  Has been doing better the last 2 to 3 days  No CP  breahtig OK  Sleeping OK  On edema.  Cough gone      Current Outpatient Prescriptions  Medication Sig Dispense Refill  . amLODipine (NORVASC) 5 MG tablet Take 2.5 mg by mouth daily.    Marland Kitchen aspirin 81 MG tablet Take 81 mg by mouth daily.    Marland Kitchen atenolol (TENORMIN) 25 MG tablet Take 25 mg by mouth daily.    . Calcium Carbonate 1500 MG TABS Take 2 tablets by mouth daily.     . Cholecalciferol (VITAMIN D3) 3000 UNITS TABS Take 1 capsule by mouth daily.    . Misc Natural Products (ALLERGY RELEAF SYSTEM PO) Take 1 capsule by mouth daily.    . Nutritional Supplements (ESTROVEN PO) Take 0.5 tablets by mouth every other day.    . simvastatin (ZOCOR) 20 MG tablet Take 20 mg by mouth daily.     No current facility-administered medications for this visit.    Allergies:   Sulfa antibiotics   Past Medical History  Diagnosis Date  . Anemia   . Arrhythmia   . Colon polyps   . Congestive heart failure   . Hyperlipidemia   . Hypertension   . UTI (lower urinary tract infection)   . Community acquired pneumonia Feb 2013    LLL  . Pleural effusion, right Feb 2013  . Osteoporosis   . SVT (supraventricular tachycardia) feb  2013  . Cataract     Past Surgical History  Procedure Laterality Date  . Abdominal hysterectomy  1983  . Appendectomy  1983  . Colonoscopy    . Upper gastrointestinal endoscopy    . Hemiarthroplasty hip  2011    Left femoral neck fracture     Social History:  The patient  reports that she has never smoked. She has never used smokeless tobacco. She reports that she does not drink alcohol or use illicit drugs.   Family History:  The patient's family history includes Colon polyps in her son; Stroke in her father.    ROS:  Please see the history of present illness. All other systems are reviewed and  Negative to the above problem except as noted.    PHYSICAL EXAM: VS:  BP 122/56 mmHg  Pulse 60  Ht 5' 4.25" (1.632 m)  Wt 132 lb 6.4 oz (60.056 kg)  BMI 22.55 kg/m2  GEN: Well nourished, well developed, in no acute distress HEENT: normal Neck: no JVD, carotid bruits, or masses Cardiac: RRR; no murmurs, rubs, or gallops,no edema  Respiratory:  clear to auscultation bilaterally, normal work of breathing GI: soft, nontender, nondistended, + BS  No hepatomegaly  MS: no deformity Moving all extremities   Skin: warm and dry, no rash Neuro:  Strength and sensation are intact Psych: euthymic mood, full affect   EKG:  EKG is ordered today.  SR 60 bpm     Lipid Panel No results found for: CHOL, TRIG, HDL, CHOLHDL, VLDL, LDLCALC, LDLDIRECT    Wt Readings from Last 3 Encounters:  03/16/15 132 lb 6.4 oz (60.056 kg)  12/16/14 133 lb 9.6 oz (60.601 kg)  04/27/12 136 lb (61.689 kg)      ASSESSMENT AND PLAN:  1.  Palpitations  Need t oget records from [facility]  Consider event monitor based on results or recurrence  2.  ? CHF  Will need to get records  Set up for echo       Current medicines are reviewed at length with the patient today.  The patient does not have concerns regarding medicines.  The following changes have been made:   Labs/ tests ordered today include: Orders  Placed This Encounter  Procedures  . B Nat Peptide  . CBC  . Basic Metabolic Panel (BMET)  . EKG 12-Lead  . 2D Echocardiogram without contrast     Disposition:   FU with  in   Signed, [name], MD  03/18/2015 1:09 PM    [address], [place],   [address] Phone: [phone]; Fax: [phone]

## 2015-03-20 ENCOUNTER — Ambulatory Visit (HOSPITAL_COMMUNITY): Payer: Medicare Other | Attending: Cardiology | Admitting: Radiology

## 2015-03-20 DIAGNOSIS — I10 Essential (primary) hypertension: Secondary | ICD-10-CM | POA: Insufficient documentation

## 2015-03-20 DIAGNOSIS — E785 Hyperlipidemia, unspecified: Secondary | ICD-10-CM | POA: Diagnosis not present

## 2015-03-20 DIAGNOSIS — Z8679 Personal history of other diseases of the circulatory system: Secondary | ICD-10-CM

## 2015-03-20 DIAGNOSIS — R0602 Shortness of breath: Secondary | ICD-10-CM | POA: Diagnosis not present

## 2015-03-20 NOTE — Progress Notes (Signed)
Echocardiogram performed.  

## 2015-03-22 ENCOUNTER — Telehealth: Payer: Self-pay | Admitting: *Deleted

## 2015-03-22 ENCOUNTER — Emergency Department (HOSPITAL_COMMUNITY): Payer: Medicare Other

## 2015-03-22 ENCOUNTER — Emergency Department (HOSPITAL_COMMUNITY)
Admission: EM | Admit: 2015-03-22 | Discharge: 2015-03-22 | Disposition: A | Discharge status: Home / Self Care | Payer: Medicare Other | Attending: Emergency Medicine | Admitting: Emergency Medicine

## 2015-03-22 ENCOUNTER — Encounter (HOSPITAL_COMMUNITY): Payer: Self-pay | Admitting: Emergency Medicine

## 2015-03-22 DIAGNOSIS — Z8701 Personal history of pneumonia (recurrent): Secondary | ICD-10-CM | POA: Insufficient documentation

## 2015-03-22 DIAGNOSIS — Z79899 Other long term (current) drug therapy: Secondary | ICD-10-CM | POA: Insufficient documentation

## 2015-03-22 DIAGNOSIS — A047 Enterocolitis due to Clostridium difficile: Secondary | ICD-10-CM | POA: Insufficient documentation

## 2015-03-22 DIAGNOSIS — R112 Nausea with vomiting, unspecified: Secondary | ICD-10-CM | POA: Diagnosis not present

## 2015-03-22 DIAGNOSIS — R10814 Left lower quadrant abdominal tenderness: Secondary | ICD-10-CM | POA: Diagnosis not present

## 2015-03-22 DIAGNOSIS — I10 Essential (primary) hypertension: Secondary | ICD-10-CM | POA: Diagnosis not present

## 2015-03-22 DIAGNOSIS — Z87440 Personal history of urinary (tract) infections: Secondary | ICD-10-CM | POA: Diagnosis not present

## 2015-03-22 DIAGNOSIS — M810 Age-related osteoporosis without current pathological fracture: Secondary | ICD-10-CM | POA: Diagnosis not present

## 2015-03-22 DIAGNOSIS — R1031 Right lower quadrant pain: Secondary | ICD-10-CM | POA: Diagnosis not present

## 2015-03-22 DIAGNOSIS — Z7982 Long term (current) use of aspirin: Secondary | ICD-10-CM | POA: Diagnosis not present

## 2015-03-22 DIAGNOSIS — K529 Noninfective gastroenteritis and colitis, unspecified: Secondary | ICD-10-CM

## 2015-03-22 DIAGNOSIS — K5669 Other intestinal obstruction: Secondary | ICD-10-CM | POA: Diagnosis not present

## 2015-03-22 DIAGNOSIS — Z8669 Personal history of other diseases of the nervous system and sense organs: Secondary | ICD-10-CM | POA: Diagnosis not present

## 2015-03-22 DIAGNOSIS — I509 Heart failure, unspecified: Secondary | ICD-10-CM | POA: Diagnosis not present

## 2015-03-22 DIAGNOSIS — Z86010 Personal history of colonic polyps: Secondary | ICD-10-CM | POA: Diagnosis not present

## 2015-03-22 DIAGNOSIS — E785 Hyperlipidemia, unspecified: Secondary | ICD-10-CM | POA: Diagnosis not present

## 2015-03-22 DIAGNOSIS — Z8709 Personal history of other diseases of the respiratory system: Secondary | ICD-10-CM | POA: Insufficient documentation

## 2015-03-22 DIAGNOSIS — R10813 Right lower quadrant abdominal tenderness: Secondary | ICD-10-CM | POA: Insufficient documentation

## 2015-03-22 DIAGNOSIS — N3289 Other specified disorders of bladder: Secondary | ICD-10-CM | POA: Diagnosis not present

## 2015-03-22 DIAGNOSIS — Z862 Personal history of diseases of the blood and blood-forming organs and certain disorders involving the immune mechanism: Secondary | ICD-10-CM | POA: Insufficient documentation

## 2015-03-22 DIAGNOSIS — A0472 Enterocolitis due to Clostridium difficile, not specified as recurrent: Secondary | ICD-10-CM

## 2015-03-22 DIAGNOSIS — R1032 Left lower quadrant pain: Secondary | ICD-10-CM | POA: Diagnosis not present

## 2015-03-22 DIAGNOSIS — R531 Weakness: Secondary | ICD-10-CM | POA: Diagnosis not present

## 2015-03-22 DIAGNOSIS — R5383 Other fatigue: Secondary | ICD-10-CM | POA: Diagnosis not present

## 2015-03-22 DIAGNOSIS — R197 Diarrhea, unspecified: Secondary | ICD-10-CM | POA: Diagnosis not present

## 2015-03-22 DIAGNOSIS — R1030 Lower abdominal pain, unspecified: Secondary | ICD-10-CM

## 2015-03-22 LAB — URINALYSIS, ROUTINE W REFLEX MICROSCOPIC
Bilirubin Urine: NEGATIVE
Glucose, UA: NEGATIVE mg/dL
Ketones, ur: NEGATIVE mg/dL
Nitrite: NEGATIVE
Protein, ur: NEGATIVE mg/dL
Specific Gravity, Urine: 1.01 (ref 1.005–1.030)
Urobilinogen, UA: 0.2 mg/dL (ref 0.0–1.0)
pH: 6 (ref 5.0–8.0)

## 2015-03-22 LAB — CBC WITH DIFFERENTIAL/PLATELET
Basophils Absolute: 0 10*3/uL (ref 0.0–0.1)
Basophils Relative: 0 % (ref 0–1)
Eosinophils Absolute: 0 10*3/uL (ref 0.0–0.7)
Eosinophils Relative: 0 % (ref 0–5)
HCT: 41.9 % (ref 36.0–46.0)
Hemoglobin: 13.7 g/dL (ref 12.0–15.0)
Lymphocytes Relative: 5 % — ABNORMAL LOW (ref 12–46)
Lymphs Abs: 0.7 10*3/uL (ref 0.7–4.0)
MCH: 30.2 pg (ref 26.0–34.0)
MCHC: 32.7 g/dL (ref 30.0–36.0)
MCV: 92.5 fL (ref 78.0–100.0)
Monocytes Absolute: 0.9 10*3/uL (ref 0.1–1.0)
Monocytes Relative: 6 % (ref 3–12)
Neutro Abs: 13.6 10*3/uL — ABNORMAL HIGH (ref 1.7–7.7)
Neutrophils Relative %: 89 % — ABNORMAL HIGH (ref 43–77)
Platelets: 127 10*3/uL — ABNORMAL LOW (ref 150–400)
RBC: 4.53 MIL/uL (ref 3.87–5.11)
RDW: 14 % (ref 11.5–15.5)
WBC: 15.2 10*3/uL — ABNORMAL HIGH (ref 4.0–10.5)

## 2015-03-22 LAB — URINE MICROSCOPIC-ADD ON

## 2015-03-22 LAB — COMPREHENSIVE METABOLIC PANEL
ALT: 22 U/L (ref 0–35)
AST: 32 U/L (ref 0–37)
Albumin: 3.8 g/dL (ref 3.5–5.2)
Alkaline Phosphatase: 85 U/L (ref 39–117)
Anion gap: 13 (ref 5–15)
BUN: 15 mg/dL (ref 6–23)
CO2: 26 mmol/L (ref 19–32)
Calcium: 8.8 mg/dL (ref 8.4–10.5)
Chloride: 92 mmol/L — ABNORMAL LOW (ref 96–112)
Creatinine, Ser: 0.9 mg/dL (ref 0.50–1.10)
GFR calc Af Amer: 68 mL/min — ABNORMAL LOW (ref >90)
GFR calc non Af Amer: 58 mL/min — ABNORMAL LOW (ref >90)
Glucose, Bld: 132 mg/dL — ABNORMAL HIGH (ref 70–99)
Potassium: 3.9 mmol/L (ref 3.5–5.1)
Sodium: 131 mmol/L — ABNORMAL LOW (ref 135–145)
Total Bilirubin: 1.1 mg/dL (ref 0.3–1.2)
Total Protein: 7.7 g/dL (ref 6.0–8.3)

## 2015-03-22 LAB — LIPASE, BLOOD: Lipase: 18 U/L (ref 11–59)

## 2015-03-22 MED ORDER — IOHEXOL 300 MG/ML  SOLN
100.0000 mL | Freq: Once | INTRAMUSCULAR | Status: AC | PRN
  Administered 2015-03-22: 100 mL via INTRAVENOUS

## 2015-03-22 MED ORDER — ONDANSETRON HCL 4 MG/2ML IJ SOLN
4.0000 mg | Freq: Once | INTRAMUSCULAR | Status: AC
  Administered 2015-03-22: 4 mg via INTRAVENOUS
  Filled 2015-03-22: qty 2

## 2015-03-22 MED ORDER — SODIUM CHLORIDE 0.9 % IV BOLUS (SEPSIS)
500.0000 mL | Freq: Once | INTRAVENOUS | Status: AC
  Administered 2015-03-22: 500 mL via INTRAVENOUS

## 2015-03-22 MED ORDER — METRONIDAZOLE 500 MG PO TABS: 500.0000 mg | ORAL_TABLET | Freq: Two times a day (BID) | ORAL | Status: DC

## 2015-03-22 MED ORDER — IOHEXOL 300 MG/ML  SOLN
50.0000 mL | Freq: Once | INTRAMUSCULAR | Status: AC | PRN
  Administered 2015-03-22: 50 mL via ORAL

## 2015-03-22 NOTE — ED Notes (Signed)
Bed: PN36 Expected date:  Expected time:  Means of arrival:  Comments: Case management with pt

## 2015-03-22 NOTE — Telephone Encounter (Signed)
Notes Recorded by [name], MD on 03/20/2015 at 11:09 PM Echo shows normal pumping function of the heart There does appear to be higher filling pressures in the heart, that the heart may be more stiff, not relax as well I would recomm 20 Lasix daily with 10 KCL  F?U BMET and BNP in 10 days.  Still waiting on records from [place]

## 2015-03-22 NOTE — ED Notes (Signed)
Pt c/o abdominal pain, nausea, emesis, and diarrhea x 3 weeks, was seen at [facility] today, sent here.

## 2015-03-22 NOTE — Telephone Encounter (Signed)
Left message for patient to call back. Records received today, dropped off by patient, placed on Dr. Alan Ripper desk for review.

## 2015-03-22 NOTE — Telephone Encounter (Signed)
-----  Message from [name] V, MD sent at 03/18/2015 11:48 PM EDT ----- CBC and electrolytes are normal  Kidney function is normal Fluid is up minimally  Await echo   No changes for now.

## 2015-03-22 NOTE — ED Provider Notes (Signed)
CSN: [id number]     Arrival date & time 03/22/15  1019 History   First MD Initiated Contact with Patient 03/22/15 1055     Chief Complaint  Patient presents with  . Diarrhea     (Consider location/radiation/quality/duration/timing/severity/associated sxs/prior Treatment) Patient is a 81 y.o. female presenting with diarrhea. The history is provided by the patient.  Diarrhea Quality:  Watery Severity:  Moderate Onset quality:  Gradual Number of episodes:  Numerous Duration:  3 weeks Timing:  Constant Progression:  Unchanged Relieved by:  Nothing Exacerbated by: eating. Ineffective treatments:  Change in diet (Probiotic) Associated symptoms: abdominal pain and vomiting   Associated symptoms: no fever   Associated symptoms comment:  Nausea Risk factors: recent antibiotic use   Risk factors: no sick contacts, no suspicious food intake and no travel to endemic areas   Risk factors comment:  Patient was taking amoxicillin when the diarrhea started   Past Medical History  Diagnosis Date  . Anemia   . Arrhythmia   . Colon polyps   . Congestive heart failure   . Hyperlipidemia   . Hypertension   . UTI (lower urinary tract infection)   . Community acquired pneumonia Feb 2013    LLL  . Pleural effusion, right Feb 2013  . Osteoporosis   . SVT (supraventricular tachycardia) feb 2013  . Cataract    Past Surgical History  Procedure Laterality Date  . Abdominal hysterectomy  1983  . Appendectomy  1983  . Colonoscopy    . Upper gastrointestinal endoscopy    . Hemiarthroplasty hip  2011    Left femoral neck fracture   Family History  Problem Relation Age of Onset  . Colon polyps Son   . Stroke Father    History  Substance Use Topics  . Smoking status: Never Smoker   . Smokeless tobacco: Never Used  . Alcohol Use: No   OB History    No data available     Review of Systems  Constitutional: Negative for fever.  Gastrointestinal: Positive for vomiting, abdominal pain  and diarrhea.  All other systems reviewed and are negative.     Allergies  Sulfa antibiotics  Home Medications   Prior to Admission medications   Medication Sig Start Date End Date Taking? Authorizing Provider  amLODipine (NORVASC) 5 MG tablet Take 2.5 mg by mouth daily.   Yes Historical Provider, MD  aspirin 81 MG tablet Take 81 mg by mouth daily.    Historical Provider, MD  atenolol (TENORMIN) 25 MG tablet Take 25 mg by mouth daily.    Historical Provider, MD  Calcium Carbonate 1500 MG TABS Take 2 tablets by mouth daily.     Historical Provider, MD  Cholecalciferol (VITAMIN D3) 3000 UNITS TABS Take 1 capsule by mouth daily.    Historical Provider, MD  Misc Natural Products (ALLERGY RELEAF SYSTEM PO) Take 1 capsule by mouth daily.    Historical Provider, MD  Nutritional Supplements (ESTROVEN PO) Take 0.5 tablets by mouth every other day.    Historical Provider, MD  simvastatin (ZOCOR) 20 MG tablet Take 20 mg by mouth daily.    Historical Provider, MD   BP 130/52 mmHg  Pulse 76  Temp(Src) 98 F (36.7 C) (Oral)  Resp 20  SpO2 98% Physical Exam  Constitutional: She is oriented to person, place, and time. She appears well-developed and well-nourished. No distress.  HENT:  Head: Normocephalic and atraumatic.  Mouth/Throat: Oropharynx is clear and moist.  Eyes: Conjunctivae and EOM are  normal. Pupils are equal, round, and reactive to light.  Neck: Normal range of motion. Neck supple.  Cardiovascular: Normal rate, regular rhythm and intact distal pulses.   No murmur heard. Pulmonary/Chest: Effort normal and breath sounds normal. No respiratory distress. She has no wheezes. She has no rales.  Abdominal: Soft. She exhibits no distension. There is tenderness in the right lower quadrant and left lower quadrant. There is no rebound and no guarding.    Musculoskeletal: Normal range of motion. She exhibits no edema or tenderness.  Neurological: She is alert and oriented to person, place,  and time.  Skin: Skin is warm and dry. No rash noted. No erythema.  Psychiatric: She has a normal mood and affect. Her behavior is normal.  Nursing note and vitals reviewed.   ED Course  Procedures (including critical care time) Labs Review Labs Reviewed  CBC WITH DIFFERENTIAL/PLATELET - Abnormal; Notable for the following:    WBC 15.2 (*)    Platelets 127 (*)    Neutrophils Relative % 89 (*)    Neutro Abs 13.6 (*)    Lymphocytes Relative 5 (*)    All other components within normal limits  COMPREHENSIVE METABOLIC PANEL - Abnormal; Notable for the following:    Sodium 131 (*)    Chloride 92 (*)    Glucose, Bld 132 (*)    GFR calc non Af Amer 58 (*)    GFR calc Af Amer 68 (*)    All other components within normal limits  CLOSTRIDIUM DIFFICILE BY PCR  LIPASE, BLOOD  URINALYSIS, ROUTINE W REFLEX MICROSCOPIC    Imaging Review Ct Abdomen Pelvis W Contrast  03/22/2015   CLINICAL DATA:  Abdominal pain, nausea, vomiting, diarrhea for 3 weeks, lower abdominal pain  EXAM: CT ABDOMEN AND PELVIS WITH CONTRAST  TECHNIQUE: Multidetector CT imaging of the abdomen and pelvis was performed using the standard protocol following bolus administration of intravenous contrast.  CONTRAST:  183m OMNIPAQUE IOHEXOL 300 MG/ML SOLN, 566mOMNIPAQUE IOHEXOL 300 MG/ML SOLN  COMPARISON:  None.  FINDINGS: The lung bases are unremarkable. Sagittal images of the spine shows degenerative changes lumbar spine. There is disc space flattening with endplate sclerotic changes and vacuum disc phenomenon at L2-L3-L3-L4 and L4-L5 level. Enhanced liver is unremarkable. The pancreas, spleen and adrenal glands are unremarkable. No calcified gallstones are noted within gallbladder.  Extensive atherosclerotic calcifications of abdominal aorta and iliac arteries. No aortic aneurysm.  No small bowel obstruction. Moderate stool noted within cecum. There is no pericecal inflammation. The patient is status post appendectomy. The terminal  ileum is unremarkable. There is some stool noted within transverse colon. There is mild thickening of the wall in distal transverse colon see axial image 46. There is thickening of the wall in splenic flexure of the colon and descending colon. Minimal stranding of pericolonic fat in left colon. Mild thickening of sigmoid colon wall. Findings are consistent with segmental colitis. There is no definite evidence of small bowel obstruction.  This is confirmed in coronal images 31 and 36.  Evaluation of the pelvis is limited by extensive metallic artifacts from left hip prosthesis.  Enhanced kidneys are symmetrical in size. No hydronephrosis or hydroureter. Enhanced pancreas, spleen and adrenal glands are unremarkable. Delayed renal images shows bilateral renal symmetrical excretion. There is a cyst in midpole of the left kidney measures 7.4 mm. Bilateral visualized proximal ureter is unremarkable.  There is a cystocele in posterior inferior aspect of the urinary bladder. Measures 2.3 cm. This is best  seen in sagittal image 69.  The patient is status post hysterectomy.  IMPRESSION: 1. There is abnormal mild thickening of colonic wall in distal transverse colon, splenic flexure of the colon descending colon and sigmoid colon. Subtle mild stranding of pericolonic fat. Findings are suspicious for long segment colitis. Clinical correlation is necessary. 2. No pericecal inflammation.  Status post appendectomy. 3. Status post hysterectomy. Evaluation of the pelvis is limited by metallic artifacts from left hip prosthesis. 4. There is a cystocele posterior inferior aspect of the urinary bladder measures 2.3 cm. This is best visualized in sagittal image 69. 5. No hydronephrosis or hydroureter. 6. No small bowel obstruction.   Electronically Signed   By: Lahoma Crocker M.D.   On: 03/22/2015 13:44     EKG Interpretation None      MDM   Final diagnoses:  Lower abdominal pain  Colitis  Clostridium difficile colitis    Patient presenting with diarrhea for 3 weeks that occurred when she was taking amoxicillin. She has seen her doctor twice for this with lab work that was unrevealing and an ultrasound that was normal. However given the persistent diarrhea nausea and inability she came here for further evaluation. On exam patient also has lower abdominal pain. She denies any respiratory or cardiac complaints at this time. She had negative done yesterday which was within normal limits. Concern for possible diverticulitis versus C. difficile colitis. CBC with a leukocytosis of 15,000, CMP without acute findings. Lipase and UA pending. CT abdomen and pelvis pending.  2:12 PM Labs consistent with a leukocytosis of 15,000 with normal CMP. CT scan shows long segment colitis which would be consistent with patient's symptoms. Concern for C. difficile colitis. Will treat patient with Flagyl. Gave pt the option to stay or go home and at this time pt chooses to go home with flagyl.  Blanchie Dessert, MD 03/22/15 1534

## 2015-03-22 NOTE — ED Notes (Signed)
Patient transported to CT 

## 2015-03-26 ENCOUNTER — Telehealth: Payer: Self-pay | Admitting: Internal Medicine

## 2015-03-26 ENCOUNTER — Telehealth: Payer: Self-pay | Admitting: *Deleted

## 2015-03-26 ENCOUNTER — Encounter: Payer: Self-pay | Admitting: *Deleted

## 2015-03-26 DIAGNOSIS — R0602 Shortness of breath: Secondary | ICD-10-CM

## 2015-03-26 DIAGNOSIS — Z8679 Personal history of other diseases of the circulatory system: Secondary | ICD-10-CM

## 2015-03-26 MED ORDER — FUROSEMIDE 20 MG PO TABS: 20.0000 mg | ORAL_TABLET | Freq: Every day | ORAL | Status: DC

## 2015-03-26 MED ORDER — POTASSIUM CHLORIDE ER 10 MEQ PO TBCR: 10.0000 meq | EXTENDED_RELEASE_TABLET | Freq: Every day | ORAL | Status: DC

## 2015-03-26 NOTE — Telephone Encounter (Signed)
Patient notified of Dr. [place] Challenger' review of her ECHO and advisement on Lasix 49m daily and KCl 161m daily. Ordered. Patient states the diarrhea has mostly resolved. She is still weak and recovering but states she feels "some better".

## 2015-03-26 NOTE — Telephone Encounter (Signed)
New message       Pt returning nurse call

## 2015-03-26 NOTE — Telephone Encounter (Signed)
Patient notified of Dr. [place] Challenger' review of her ECHO and labs and advisement on Lasix 82m daily and KCl 123m daily. Ordered. Patient states the diarrhea has mostly resolved. She is still weak and recovering but states she feels "some better".  Letter sent to remind her of upcoming labs due BMET and BNP.

## 2015-03-29 NOTE — Telephone Encounter (Signed)
Results provided by triage RN

## 2015-03-30 ENCOUNTER — Telehealth: Payer: Self-pay | Admitting: Internal Medicine

## 2015-03-30 NOTE — Telephone Encounter (Signed)
New message     Received a letter from Paula---pt needs lab work. She states she just had labs drawn.  Will she need more labs?

## 2015-03-30 NOTE — Telephone Encounter (Signed)
Pt will come in for lab work on Monday. She is still on antibiotics. Diarrhea imiproved, tolerating foods.  She would like her papers back ( if Dr. [place] Challenger has finished reviewing them) from [place]. If, so, she would like to pick those up Monday.   Forwarded to Dr. [place] Challenger & Job Founds RN

## 2015-04-02 ENCOUNTER — Other Ambulatory Visit (INDEPENDENT_AMBULATORY_CARE_PROVIDER_SITE_OTHER): Payer: Medicare Other | Admitting: *Deleted

## 2015-04-02 DIAGNOSIS — Z8679 Personal history of other diseases of the circulatory system: Secondary | ICD-10-CM

## 2015-04-02 DIAGNOSIS — R0602 Shortness of breath: Secondary | ICD-10-CM

## 2015-04-02 LAB — BASIC METABOLIC PANEL WITH GFR
BUN: 25 mg/dL — ABNORMAL HIGH (ref 6–23)
CO2: 27 meq/L (ref 19–32)
Calcium: 9 mg/dL (ref 8.4–10.5)
Chloride: 102 meq/L (ref 96–112)
Creatinine, Ser: 1.07 mg/dL (ref 0.40–1.20)
GFR: 52.3 mL/min — ABNORMAL LOW
Glucose, Bld: 100 mg/dL — ABNORMAL HIGH (ref 70–99)
Potassium: 4.3 meq/L (ref 3.5–5.1)
Sodium: 136 meq/L (ref 135–145)

## 2015-04-02 LAB — BRAIN NATRIURETIC PEPTIDE: Pro B Natriuretic peptide (BNP): 668 pg/mL — ABNORMAL HIGH (ref 0.0–100.0)

## 2015-04-04 ENCOUNTER — Telehealth: Payer: Self-pay | Admitting: Internal Medicine

## 2015-04-04 DIAGNOSIS — L821 Other seborrheic keratosis: Secondary | ICD-10-CM | POA: Diagnosis not present

## 2015-04-04 DIAGNOSIS — D485 Neoplasm of uncertain behavior of skin: Secondary | ICD-10-CM | POA: Diagnosis not present

## 2015-04-04 DIAGNOSIS — B351 Tinea unguium: Secondary | ICD-10-CM | POA: Diagnosis not present

## 2015-04-04 NOTE — Telephone Encounter (Signed)
Spoke with patient who states she has hx of CHF and she didn't know this stuff wouldn't go away; c/o bilateral ankle swelling.  Patient asks what she is supposed to do.  She denies SOB; states she wanted to call us before that happened.  Patient states she is taking Lasix 20 mg daily along with K+ supplement.  States she thinks her weight is creeping up but does not give me specific numbers so I cannot assess how much, if any, weight she has gained.  I advised her to record her daily weight tomorrow.  She states she does not think she is urinating more than she did before starting the Lasix - states she has also had to urinate frequently.  Patient states she is currently elevating her legs and I discussed making certain to keep them elevated above her heart; patient confirmed that she is doing this.  I discussed patient's diet with her and she admits to eating some canned soup and or meat.  I advised her to avoid canned and processed foods and that I will route message to Dr. [place] Challenger for additional advice.   I advised her someone would call her back tomorrow with Dr. Alan Ripper advice.  Patient verbalized understanding and agreement with plan of care.

## 2015-04-04 NOTE — Telephone Encounter (Signed)
Pt c/o swelling: STAT is pt has developed SOB within 24 hours  1. How long have you been experiencing swelling? 1 month ago  2. Where is the swelling located? Both ankles  3.  Are you currently taking a "fluid pill"?Yes  4.  Are you currently SOB? No  5.  Have you traveled recently?No

## 2015-04-05 ENCOUNTER — Telehealth: Payer: Self-pay | Admitting: *Deleted

## 2015-04-05 DIAGNOSIS — I10 Essential (primary) hypertension: Secondary | ICD-10-CM

## 2015-04-05 DIAGNOSIS — I499 Cardiac arrhythmia, unspecified: Secondary | ICD-10-CM

## 2015-04-05 DIAGNOSIS — I509 Heart failure, unspecified: Secondary | ICD-10-CM

## 2015-04-05 MED ORDER — FUROSEMIDE 20 MG PO TABS: ORAL_TABLET | ORAL | Status: DC

## 2015-04-05 MED ORDER — POTASSIUM CHLORIDE ER 10 MEQ PO TBCR: EXTENDED_RELEASE_TABLET | ORAL | Status: DC

## 2015-04-05 NOTE — Telephone Encounter (Signed)
Patient notified of lab results recommendations and medication changes by Dr. Harrington Challenger. Patient verbalized understanding and agreement to changes/advisements. She will come in next week for follow up lab work. Office will contact her regarding future appointment with Dr. Harrington Challenger once scheduled. Patient will start new Lasix/KCl regimen tomorrow on 4/8, as well as, tracking daily weights. See orders below from Dr. Harrington Challenger. Will route to Dr. Harrington Challenger to see if she prefers to see patient back prior to Richardson Dopp, [place], appointment on May 11th.   Result Note     Would increase lasix to 40 mg with 20 KCL every 3rd day.     BMET and BNP in 1 wk     Keep track of wts     Try to put in appt with me when I am in clinic again.     ----- Message -----     From: Thompson Grayer, RN     Sent: 04/04/2015  5:53 PM      To: [name], MD, Rodman Key, RN        Pt notified. She reports swelling in feet/ankles. No shortness of breath. 5 lb weight gain since 4/1. On 4/1 was 125 lbs. Today is 130 lbs. Follow up appt made with Richardson Dopp, PA on May 09, 2015 at 11:30. Will forward to Dr. Harrington Challenger regarding weight gain and swelling (also see phone note dated 04/04/15)

## 2015-04-05 NOTE — Telephone Encounter (Signed)
Notes Recorded by Thompson Grayer, RN on 04/04/2015 at 5:53 PM Pt notified. She reports swelling in feet/ankles. No shortness of breath. 5 lb weight gain since 4/1. On 4/1 was 125 lbs. Today is 130 lbs. Follow up appt made with Richardson Dopp, PA on May 09, 2015 at 11:30. Will forward to Dr. Harrington Challenger regarding weight gain and swelling (also see phone note dated 04/04/15)  Notes Recorded by [name], MD on 04/04/2015 at 9:04 AM Electrolytes are OK Fluid is up a little I would keep on same meds Should have f/u in several wks to f/u volume

## 2015-04-06 NOTE — Telephone Encounter (Signed)
Put her in on Monday.4/11

## 2015-04-06 NOTE — Telephone Encounter (Signed)
Patient scheduled to see Dr. Harrington Challenger on Monday, 4/11 _0 . Notified patient and she stated that she definitely would like that appointment. She has taken the extra Lasix and KCl today, as per our conversation yesterday. No worsening of sx this morning.

## 2015-04-09 ENCOUNTER — Encounter: Payer: Self-pay | Admitting: Internal Medicine

## 2015-04-09 ENCOUNTER — Ambulatory Visit (INDEPENDENT_AMBULATORY_CARE_PROVIDER_SITE_OTHER): Payer: Medicare Other | Admitting: Internal Medicine

## 2015-04-09 DIAGNOSIS — I499 Cardiac arrhythmia, unspecified: Secondary | ICD-10-CM

## 2015-04-09 DIAGNOSIS — I509 Heart failure, unspecified: Secondary | ICD-10-CM | POA: Diagnosis not present

## 2015-04-09 DIAGNOSIS — I10 Essential (primary) hypertension: Secondary | ICD-10-CM | POA: Diagnosis not present

## 2015-04-09 LAB — BASIC METABOLIC PANEL
BUN: 23 mg/dL (ref 6–23)
CO2: 30 mEq/L (ref 19–32)
Calcium: 9.9 mg/dL (ref 8.4–10.5)
Chloride: 100 mEq/L (ref 96–112)
Creatinine, Ser: 0.89 mg/dL (ref 0.40–1.20)
GFR: 64.68 mL/min (ref >60.00)
Glucose, Bld: 97 mg/dL (ref 70–99)
Potassium: 4.4 mEq/L (ref 3.5–5.1)
Sodium: 135 mEq/L (ref 135–145)

## 2015-04-09 LAB — BRAIN NATRIURETIC PEPTIDE: Pro B Natriuretic peptide (BNP): 559 pg/mL — ABNORMAL HIGH (ref 0.0–100.0)

## 2015-04-09 NOTE — Patient Instructions (Signed)
Your physician recommends that you return for lab work TODAY (BMET, BNP)  Your physician recommends that you continue on your current medications as directed. Please refer to the Current Medication list given to you today.  Your physician recommends that you schedule a follow-up appointment in: 2 [place].

## 2015-04-09 NOTE — Progress Notes (Signed)
Cardiology Office Note   Date:  04/09/2015   ID:  [name] Dyer, DOB 1932-11-20, MRN [id number]  PCP:  [name], MD  Cardiologist:   [name], MD   No chief complaint on file.     History of Present Illness: [name] Dyer is a 81 y.o. female with a history of reported CHF  I saw her earlier this spring  She had moved from [facility] a few years ago  She was referred by Dr Noah Delaine for mild edema on Xray  Treated with lasix    When I saw her I ordered and echo  This showed normal LVEF increased filling pressures  I recomm KCL and 20 mg lasix daily    Review of records received from [facility]:  IN 2013 the patiet was admitted with fever, pneumonia, diarrhea  HR elevated  Mild increase in trop felt due to high HR and low BP She had SVT that converted.  Echo done that showed mod MR, mild TR.  Normal LVEF Patient Has been doing better the last 2 to 3 days No CP breahtig OK Sleeping OK On edema. Cough gone   Since visit she was seen in ER for diarrhea once  She continues to have intermitt swelling  She is taking 20 mg lasix day with 40 mg on 3rd day  Wants to know why she needs lasix.    Denies CP Breahting is pretty good         Current Outpatient Prescriptions  Medication Sig Dispense Refill  . amLODipine (NORVASC) 5 MG tablet Take 2.5 mg by mouth daily.    Marland Kitchen aspirin 81 MG tablet Take 81 mg by mouth daily.    Marland Kitchen atenolol (TENORMIN) 25 MG tablet Take 25 mg by mouth daily.    . Calcium Carbonate 1500 MG TABS Take 2 tablets by mouth daily.     . Cholecalciferol (VITAMIN D3) 3000 UNITS TABS Take 5,000 mg by mouth daily.     . furosemide (LASIX) 20 MG tablet Furosemide 20 mg by mouth daily. In addition, every third (3rd) day add an extra 20 mg for a total daily dose of 40 mg. 120 tablet 3  . Misc Natural Products (ALLERGY RELEAF SYSTEM PO) Take 1 capsule by mouth daily.    . Nutritional Supplements (ESTROVEN PO) Take 0.5 tablets by mouth every other day.    . potassium  chloride (K-DUR) 10 MEQ tablet Potassium Chloride 10 meq by mouth daily. In addition, every third (3rd) day add an extra 10 meq for a total daily dose of 20 meq. This should be the same day that the extra dose of Furosemide is taken by patient. 120 tablet 3  . simvastatin (ZOCOR) 20 MG tablet Take 20 mg by mouth daily.     No current facility-administered medications for this visit.    Allergies:   Sulfa antibiotics   Past Medical History  Diagnosis Date  . Anemia   . Arrhythmia   . Colon polyps   . Congestive heart failure   . Hyperlipidemia   . Hypertension   . UTI (lower urinary tract infection)   . Community acquired pneumonia Feb 2013    LLL  . Pleural effusion, right Feb 2013  . Osteoporosis   . SVT (supraventricular tachycardia) feb 2013  . Cataract     Past Surgical History  Procedure Laterality Date  . Abdominal hysterectomy  1983  . Appendectomy  1983  . Colonoscopy    . Upper gastrointestinal  endoscopy    . Hemiarthroplasty hip  2011    Left femoral neck fracture     Social History:  The patient  reports that she has never smoked. She has never used smokeless tobacco. She reports that she does not drink alcohol or use illicit drugs.   Family History:  The patient's family history includes Colon polyps in her son; Stroke in her father.    ROS:  Please see the history of present illness. All other systems are reviewed and  Negative to the above problem except as noted.    PHYSICAL EXAM: VS:  BP 120/64 mmHg  Pulse 96  Ht _0  (1.626 m)  Wt 132 lb 6.4 oz (60.056 kg)  BMI 22.72 kg/m2  GEN: Well nourished, well developed, in no acute distress HEENT: normal Neck: no JVD, carotid bruits, or masses Cardiac: RRR; no murmurs, rubs, or gallops,no edema  Respiratory:  clear to auscultation bilaterally, normal work of breathing GI: soft, nontender, nondistended, + BS  No hepatomegaly  MS: no deformity Moving all extremities   Skin: warm and dry, no rash Neuro:   Strength and sensation are intact Psych: euthymic mood, full affect   EKG:  EKG is not ordered today.   Lipid Panel No results found for: CHOL, TRIG, HDL, CHOLHDL, VLDL, LDLCALC, LDLDIRECT    Wt Readings from Last 3 Encounters:  04/09/15 132 lb 6.4 oz (60.056 kg)  03/16/15 132 lb 6.4 oz (60.056 kg)  12/16/14 133 lb 9.6 oz (60.601 kg)      ASSESSMENT AND PLAN: 1.  Chronic diastolic CHF  Volume looks pretty good today  She says LE are edematous at times   I would recomm checking labs again today  I would keep on same regimen for now I am not convinced of ischemia  2.  HTN  Continue amlodipine.    3.  HL  On statin  4.  SVT  No definte recurrence   Signed, [name], MD  04/09/2015 2:58 PM    [address], [place], [place]  [address] Phone: [phone]; Fax: [phone]

## 2015-04-19 ENCOUNTER — Other Ambulatory Visit: Payer: Self-pay

## 2015-04-19 ENCOUNTER — Telehealth: Payer: Self-pay

## 2015-04-19 MED ORDER — POTASSIUM CHLORIDE ER 10 MEQ PO TBCR: EXTENDED_RELEASE_TABLET | ORAL | Status: DC

## 2015-04-21 NOTE — Telephone Encounter (Signed)
She can stop  Follow diarrhea  Could resume a few time sa week and see if recur   If still problem discuss with Dr Alyson Ingles

## 2015-04-23 ENCOUNTER — Telehealth: Payer: Self-pay

## 2015-04-23 DIAGNOSIS — E785 Hyperlipidemia, unspecified: Secondary | ICD-10-CM

## 2015-04-23 DIAGNOSIS — I5032 Chronic diastolic (congestive) heart failure: Secondary | ICD-10-CM

## 2015-04-23 DIAGNOSIS — I10 Essential (primary) hypertension: Secondary | ICD-10-CM

## 2015-04-23 NOTE — Telephone Encounter (Signed)
Pt calling Dr Harrington Challenger and nurse to inform them that within the past week or so, she has noticed some mild LEE.  Pt reports the edema is just in her lower extremities.  Pt states that her weight in early March was 137 lbs, and now her weight has significantly improved at 128 lbs. Pt states that one day her weight increased to 129 lbs, but has returned back to 128 lbs as of today.  Pt would like to keep her weight there, for she states "in March she felt horrible, due to fluid retention.  Pt states she is being compliant with taking all her meds prescribed, including her diuretic and K.  Pt denies any sob, DOE, cp, palpitations, dizziness, pre-syncopal or syncopal episodes at this time.  Pt states she has not been so compliant with her diet over the past week.  Pt states her sodium intake has increased, for she has been eating out a lot.  Pt reports her water intake is not so well either.  Pt just wanted to inform Dr Harrington Challenger of this, incase she had any other suggestions.  Informed the pt that Dr Harrington Challenger is here in the office, and I will forward this message to her for further review and recommendation and follow-up thereafter.  Advised the pt to keep her sodium intake to a healthy minimum.  Advised the pt to drink more water.  Advised the pt to elevated her legs when resting.  Advised the pt to wear her support stockings to reduce edema.  Advised the pt to continue current med regimen until further advised.  Advised the pt to continue monitoring and logging her weight daily.  Advised the pt to monitor her BP and HR daily, if she has a cuff.  Pt verbalized understanding and agrees with this plan.

## 2015-04-23 NOTE — Telephone Encounter (Signed)
Called patient to let her know what Dr Harrington Challenger said about the probiotic and to talk to Dr Alyson Ingles about it

## 2015-04-25 NOTE — Telephone Encounter (Signed)
I want to see lab results first before changing lasix dosing

## 2015-04-25 NOTE — Telephone Encounter (Signed)
Set patient up for BMET and BNP.   Needs to cut back on salt and prob ramp up on lasix.

## 2015-04-25 NOTE — Telephone Encounter (Signed)
Labs (BMET & BNP) ordered and scheduled to be done prior to 05/05/15. Attempted to call patient but line busy. Will try again this afternoon so that she can be informed of labs needed.

## 2015-04-25 NOTE — Telephone Encounter (Signed)
Patient has started checking her BP daily.  BP "okay - 118/81" and HR 107. She has been rechecking her HR and it has ranged from 99-117, at rest. She  121/75, HR 107.

## 2015-04-25 NOTE — Telephone Encounter (Signed)
Routed to Dr. Harrington Challenger to clarify new dosing for Lasix.

## 2015-04-25 NOTE — Telephone Encounter (Signed)
Unfinished note below due to computer issue.   Called patient. She has started checking her BP (daily) as of today. BP this morning "okay - 118/81, HR 105" She rechecked the HR several times and it ranged from 99-117, at rest. BP this afternoon 121/75, HR 107.  Patient concerned that the HR is over 100, as she states her normal HR is in the 60-70 range. States she feels "somewhat a little jittery at times". Denies CP, SOB or swelling. She states she doesn't feel any unusual heart rhythm changes. Encouraged her to continue taking her BP, HR daily and to call Friday if she is still experiencing elevated HR. Also encouraged her to call if she notes any other changes in her status or new symptoms. Informed her that I would route message to Dr. Harrington Challenger to review, however it is important to make sure she checks her BP and HR while at rest. She states she has been doing more activity outside, has recently been ill with a GI disturbance and tries to limit salt intake. Provided her with information on how to watch for extra salt in foods, importance of not missing medication doses and resting between periods of increased activity especially in the sun/warm weather. Patient verbalized understanding and appreciation for all the information. Also reviewed medications with her, including frequency. Patient will call back Friday to provide BP and HR numbers. Routing to Dr. Harrington Challenger.

## 2015-04-26 ENCOUNTER — Other Ambulatory Visit (INDEPENDENT_AMBULATORY_CARE_PROVIDER_SITE_OTHER): Payer: Medicare Other | Admitting: *Deleted

## 2015-04-26 ENCOUNTER — Ambulatory Visit (INDEPENDENT_AMBULATORY_CARE_PROVIDER_SITE_OTHER): Payer: Medicare Other | Admitting: *Deleted

## 2015-04-26 VITALS — BP 116/71 | HR 107 | Resp 18

## 2015-04-26 DIAGNOSIS — E785 Hyperlipidemia, unspecified: Secondary | ICD-10-CM | POA: Diagnosis not present

## 2015-04-26 DIAGNOSIS — I10 Essential (primary) hypertension: Secondary | ICD-10-CM

## 2015-04-26 DIAGNOSIS — I5032 Chronic diastolic (congestive) heart failure: Secondary | ICD-10-CM | POA: Diagnosis not present

## 2015-04-26 DIAGNOSIS — I499 Cardiac arrhythmia, unspecified: Secondary | ICD-10-CM | POA: Diagnosis not present

## 2015-04-26 LAB — BASIC METABOLIC PANEL
BUN: 22 mg/dL (ref 6–23)
CO2: 31 mEq/L (ref 19–32)
Calcium: 9.1 mg/dL (ref 8.4–10.5)
Chloride: 101 mEq/L (ref 96–112)
Creatinine, Ser: 0.91 mg/dL (ref 0.40–1.20)
GFR: 63.04 mL/min (ref >60.00)
Glucose, Bld: 97 mg/dL (ref 70–99)
Potassium: 4.3 mEq/L (ref 3.5–5.1)
Sodium: 136 mEq/L (ref 135–145)

## 2015-04-26 LAB — BRAIN NATRIURETIC PEPTIDE: Pro B Natriuretic peptide (BNP): 433 pg/mL — ABNORMAL HIGH (ref 0.0–100.0)

## 2015-04-26 MED ORDER — DILTIAZEM HCL ER COATED BEADS 120 MG PO CP24: 120.0000 mg | ORAL_CAPSULE | Freq: Every day | ORAL | Status: DC

## 2015-04-26 NOTE — Patient Instructions (Signed)
Your physician has recommended you make the following change in your medication:   1.) stop amlodipine 2.) start diltizem 120 mg daily  Dr. Harrington Challenger will call you later tonight to discuss plan of care/review your blood work results.

## 2015-04-26 NOTE — Addendum Note (Signed)
Addended by: Eulis Foster on: 04/26/2015 03:56 PM   Modules accepted: Orders

## 2015-04-26 NOTE — Telephone Encounter (Signed)
Have pt scheduled for BMET and BNP with HR up and swelling [name] should have BP and P taken   Please call with results of vitals

## 2015-04-26 NOTE — Addendum Note (Signed)
Addended by: Kalli Greenfield K on: 04/26/2015 04:29 PM   Modules accepted: Orders  

## 2015-04-26 NOTE — Progress Notes (Signed)
1.) Reason for visit: BP, HR check, patient has been getting pulse rates of 120s at home, feeling tired and c/o feet/ankles swelling  2.) Name of MD requesting visit: Dr. Harrington Challenger  3.)  H&P: taking lasix and potassium for swelling to feet/ankles, here for f/u bmet, bnp today, but reporting elevated heart rate for " a couple weeks"  3.) ROS related to problem: 1+pedal edema present, instructed patient on decreased sodium intake and support hose  4.) Assessment and plan per MD: ekg reviewed by Kerin Ransom (FLEX DOD ASSIST)--pt found to be in aflutter 107 BPM; obtain additional labs--TSH AND CBC, dc amlodipine; start diltiazem 120 mg daily             Dr. Harrington Challenger informed.  She will review and will contact patient at home later tonight.

## 2015-04-26 NOTE — Telephone Encounter (Signed)
Patient coming today for labs.  This am at home her BP was 135/92, HR was 123. Will check vitals when pt arrives.

## 2015-04-26 NOTE — Addendum Note (Signed)
Addended by: Eulis Foster on: 04/26/2015 04:29 PM   Modules accepted: Orders

## 2015-04-27 ENCOUNTER — Telehealth: Payer: Self-pay | Admitting: Internal Medicine

## 2015-04-27 ENCOUNTER — Telehealth: Payer: Self-pay | Admitting: *Deleted

## 2015-04-27 LAB — CBC
HCT: 38.2 % (ref 36.0–46.0)
Hemoglobin: 12.7 g/dL (ref 12.0–15.0)
MCHC: 33.2 g/dL (ref 30.0–36.0)
MCV: 91.1 fl (ref 78.0–100.0)
Platelets: 206 10*3/uL (ref 150.0–400.0)
RBC: 4.2 Mil/uL (ref 3.87–5.11)
RDW: 14.3 % (ref 11.5–15.5)
WBC: 7.4 10*3/uL (ref 4.0–10.5)

## 2015-04-27 LAB — TSH: TSH: 3.39 u[IU]/mL (ref 0.35–4.50)

## 2015-04-27 MED ORDER — APIXABAN 5 MG PO TABS
5.0000 mg | ORAL_TABLET | Freq: Two times a day (BID) | ORAL | Status: DC
Start: 2015-04-27 — End: 2015-06-08

## 2015-04-27 NOTE — Progress Notes (Signed)
REivewed EKG  [name] is in atrial fib/flutter  Should be on anticoagulation Would recomm Eliquis 5 mg po bid Stop aspirin.   See that patinet fhas f/u with me or PA next wk.

## 2015-04-27 NOTE — Telephone Encounter (Signed)
New message      Talk to [facility] regarding the appt your made; question about a new presc that was called in---do not know name of medication; and  Question about a xray taken on 03-22-15

## 2015-04-27 NOTE — Telephone Encounter (Signed)
dc'd asa, ordered Eliquis 5 mg bid Added to Dr. Alan Ripper schedule Monday 5/9 10:45 am appointment. Informed patient.  Verbalizes understanding.  She has not picked up the diltiazem yet.  Did not take any amlodipine this am. Advised her to go to pharmacy today to pick up new medications.

## 2015-04-27 NOTE — Telephone Encounter (Signed)
[name], MD at 04/27/2015 11:11 AM     Status: Signed       Expand All Collapse All   REivewed EKG [name] is in atrial fib/flutter Should be on anticoagulation Would recomm Eliquis 5 mg po bid Stop aspirin.  See that patinet fhas f/u with me or PA next wk.

## 2015-04-27 NOTE — Telephone Encounter (Signed)
Patient wanted to change her appointment on 5/9 due to dental appointment that day but I encouraged her to keep it and try to reschedule her dental cleaning due to limited availability of appointments with Dr. Harrington Challenger or physician extenders. --Dr. Harrington Challenger would like her to be seen next week.   She agreed.  Also, she said the pharmacy does not have both diltiazem and eliquis ready for her--they need more information before filling it.   [place]. Insurance requiring prior authorization. After numerous attempts and 1/2 hour of time spent--called patient and informed of need for prior auth. Samples and 30 day free trial card placed at front desk.  She will come today to pick this up.

## 2015-05-07 ENCOUNTER — Ambulatory Visit (INDEPENDENT_AMBULATORY_CARE_PROVIDER_SITE_OTHER): Payer: Medicare Other | Admitting: Internal Medicine

## 2015-05-07 ENCOUNTER — Encounter: Payer: Self-pay | Admitting: Internal Medicine

## 2015-05-07 VITALS — BP 120/50 | HR 88 | Ht 64.0 in | Wt 130.8 lb

## 2015-05-07 DIAGNOSIS — I509 Heart failure, unspecified: Secondary | ICD-10-CM

## 2015-05-07 LAB — BASIC METABOLIC PANEL
BUN: 27 mg/dL — ABNORMAL HIGH (ref 6–23)
CO2: 31 mEq/L (ref 19–32)
Calcium: 9.4 mg/dL (ref 8.4–10.5)
Chloride: 102 mEq/L (ref 96–112)
Creatinine, Ser: 0.83 mg/dL (ref 0.40–1.20)
GFR: 70.1 mL/min (ref >60.00)
Glucose, Bld: 94 mg/dL (ref 70–99)
Potassium: 4 mEq/L (ref 3.5–5.1)
Sodium: 138 mEq/L (ref 135–145)

## 2015-05-07 LAB — BRAIN NATRIURETIC PEPTIDE: Pro B Natriuretic peptide (BNP): 379 pg/mL — ABNORMAL HIGH (ref 0.0–100.0)

## 2015-05-07 NOTE — Patient Instructions (Signed)
Medication Instructions:  Your physician recommends that you continue on your current medications as directed. Please refer to the Current Medication list given to you today.   Labwork: Your physician recommends that you return for lab work in: today (bmet, bnp).   Testing/Procedures:   Follow-Up: Your physician recommends that you schedule a follow-up appointment in: August 2016 with Dr. Harrington Challenger.

## 2015-05-07 NOTE — Progress Notes (Signed)
Cardiology Office Note   Date:  05/07/2015   ID:  [name] Dyer, DOB 1932-08-17, MRN [id number]  PCP:  [name] Shelter, MD  Cardiologist:   [name] Carnes, MD   No chief complaint on file.     History of Present Illness: [name] Dyer is a 81 y.o. female with a history of [name] with afib   Dneis dizziness  No CP  NO SOB  No palpitations    Has BM every time she goes to bathroom  Not diarrhea       Current Outpatient Prescriptions  Medication Sig Dispense Refill  . apixaban (ELIQUIS) 5 MG TABS tablet Take 1 tablet (5 mg total) by mouth 2 (two) times daily. 60 tablet 11  . atenolol (TENORMIN) 25 MG tablet Take 25 mg by mouth daily.    . Calcium Carbonate 1500 MG TABS Take 2 tablets by mouth daily.     . Cholecalciferol (VITAMIN D3) 3000 UNITS TABS Take 5,000 mg by mouth daily.     Marland Kitchen diltiazem (CARDIZEM CD) 120 MG 24 hr capsule Take 1 capsule (120 mg total) by mouth daily. 30 capsule 6  . furosemide (LASIX) 20 MG tablet Furosemide 20 mg by mouth daily. In addition, every third (3rd) day add an extra 20 mg for a total daily dose of 40 mg. 120 tablet 3  . Misc Natural Products (ALLERGY RELEAF SYSTEM PO) Take 1 capsule by mouth daily.    . Nutritional Supplements (ESTROVEN PO) Take 0.5 tablets by mouth every other day.    . potassium chloride (K-DUR) 10 MEQ tablet Take one tablet by mouth daily, except on every third (3rd) day take two tablets for 20 meq. the same day that you take extra dose of Lasix 120 tablet 3  . simvastatin (ZOCOR) 20 MG tablet Take 20 mg by mouth daily. Pt. Currently not taking was told to hold until she she Dr. Noah Dyer     No current facility-administered medications for this visit.    Allergies:   Sulfa antibiotics   Past Medical History  Diagnosis Date  . Anemia   . Arrhythmia   . Colon polyps   . Congestive heart failure   . Hyperlipidemia   . Hypertension   . UTI (lower urinary tract infection)   . Community acquired pneumonia Feb  2013    LLL  . Pleural effusion, right Feb 2013  . Osteoporosis   . SVT (supraventricular tachycardia) feb 2013  . Cataract     Past Surgical History  Procedure Laterality Date  . Abdominal hysterectomy  1983  . Appendectomy  1983  . Colonoscopy    . Upper gastrointestinal endoscopy    . Hemiarthroplasty hip  2011    Left femoral neck fracture     Social History:  The patient  reports that she has never smoked. She has never used smokeless tobacco. She reports that she does not drink alcohol or use illicit drugs.   Family History:  The patient's family history includes Colon polyps in her son; Stroke in her father.    ROS:  Please see the history of present illness. All other systems are reviewed and  Negative to the above problem except as noted.    PHYSICAL EXAM: VS:  BP 120/50 mmHg  Pulse 88  Ht 5' 4" (1.626 m)  Wt 130 lb 12.8 oz (59.33 kg)  BMI 22.44 kg/m2  GEN: Well nourished, well developed, in no acute distress HEENT: normal Neck: no JVD, carotid bruits,  or masses Cardiac: Irreg  ; no murmurs, rubs, or gallops,no edema  Respiratory:  clear to auscultation bilaterally, normal work of breathing GI: soft, nontender, nondistended, + BS  No hepatomegaly  MS: no deformity Moving all extremities   Skin: warm and dry, no rash Neuro:  Strength and sensation are intact Psych: euthymic mood, full affect   EKG:  EKG is ordered today.  Afib  88 bpm     Lipid Panel No results found for: CHOL, TRIG, HDL, CHOLHDL, VLDL, LDLCALC, LDLDIRECT    Wt Readings from Last 3 Encounters:  05/07/15 130 lb 12.8 oz (59.33 kg)  04/09/15 132 lb 6.4 oz (60.056 kg)  03/16/15 132 lb 6.4 oz (60.056 kg)      ASSESSMENT AND PLAN: 1.  afib rates are good  Check BMET and BNP  Keep on meds  2.  HTn Good control  3  HL  Off of statin until talks to primary MD    3. GI  Discuss with primary      Signed, [name] Carnes, MD  05/07/2015 11:17 AM    [address], [place], [place]  [address] Phone: [phone]; Fax: [phone]

## 2015-05-09 ENCOUNTER — Ambulatory Visit: Payer: Medicare Other | Admitting: Physician Assistant

## 2015-05-10 ENCOUNTER — Telehealth: Payer: Self-pay

## 2015-05-14 DIAGNOSIS — H26493 Other secondary cataract, bilateral: Secondary | ICD-10-CM | POA: Diagnosis not present

## 2015-05-14 DIAGNOSIS — H26492 Other secondary cataract, left eye: Secondary | ICD-10-CM | POA: Diagnosis not present

## 2015-05-15 NOTE — Telephone Encounter (Signed)
She needs to stay on Eliquis not Plavix   Has atrial fibrillation   Please see if she can qualify fr tier coverage

## 2015-05-17 ENCOUNTER — Telehealth: Payer: Self-pay | Admitting: Internal Medicine

## 2015-05-17 NOTE — Telephone Encounter (Signed)
Pt called because she said the Eliquis 5 mg twice a day medication needs to be pre authorized before it is dispense by [facility] at [place].  Pt states the pharmacy sent the pre-authorization for to this office three weeks ago. Pt called today because he is running out of the samples. A 21 days/ 5 mg  Eliquis placed at the front desk.pt is aware, also she is aware  that I will let pre-authorized nurse to check into this.

## 2015-05-17 NOTE — Telephone Encounter (Signed)
New Message   Pt requesting to speak w/ Rn about alternative generic meds: possibly xarelto. Pt wanted to speak w/ RN abouit what to do going forward. Please call back and discuss.

## 2015-05-18 ENCOUNTER — Telehealth: Payer: Self-pay

## 2015-05-18 NOTE — Telephone Encounter (Signed)
Called and got a prior auth for patient eliquis it will cost 42.00 dollars for 30 day supply it was approved until 05/17/16  and patient states that is too high she would like a tier exceptation done

## 2015-06-01 NOTE — Telephone Encounter (Signed)
Faxed a request for a tier exception for Eliquis

## 2015-06-01 NOTE — Telephone Encounter (Signed)
[name] Burkitt Reiland, LPN at 05/03/6482 0:32 AM     Status: Signed       Expand All Collapse All   Faxed a request for a tier exception for Eliquis            [name] Dyer at 05/18/2015 1:05 PM     Status: Signed       Expand All Collapse All   Called and got a prior auth for patient eliquis it will cost 42.00 dollars for 30 day supply it was approved until 05/17/16 and patient states that is too high she would like a tier exceptation done

## 2015-06-04 ENCOUNTER — Ambulatory Visit: Payer: Medicare Other | Admitting: Internal Medicine

## 2015-06-04 DIAGNOSIS — I10 Essential (primary) hypertension: Secondary | ICD-10-CM | POA: Diagnosis not present

## 2015-06-04 DIAGNOSIS — M810 Age-related osteoporosis without current pathological fracture: Secondary | ICD-10-CM | POA: Diagnosis not present

## 2015-06-04 DIAGNOSIS — E785 Hyperlipidemia, unspecified: Secondary | ICD-10-CM | POA: Diagnosis not present

## 2015-06-06 ENCOUNTER — Telehealth: Payer: Self-pay | Admitting: Internal Medicine

## 2015-06-06 NOTE — Telephone Encounter (Signed)
Tried several times in a row to reach patient.  Number is busy. Called (W) [phone].--not in service.

## 2015-06-06 NOTE — Telephone Encounter (Signed)
New message       Talk to a nurse. Pt has several questions

## 2015-06-08 ENCOUNTER — Telehealth: Payer: Self-pay

## 2015-06-08 MED ORDER — APIXABAN 5 MG PO TABS: 5.0000 mg | ORAL_TABLET | Freq: Two times a day (BID) | ORAL | Status: DC

## 2015-06-08 NOTE — Telephone Encounter (Signed)
Samples of Eliquis 64m 2 boxes given to patient. Also sent new Rx to [facility] for [facility] days. Still working on Tier exception for her.

## 2015-06-11 DIAGNOSIS — E785 Hyperlipidemia, unspecified: Secondary | ICD-10-CM | POA: Diagnosis not present

## 2015-06-11 DIAGNOSIS — I10 Essential (primary) hypertension: Secondary | ICD-10-CM | POA: Diagnosis not present

## 2015-06-11 DIAGNOSIS — M810 Age-related osteoporosis without current pathological fracture: Secondary | ICD-10-CM | POA: Diagnosis not present

## 2015-06-11 DIAGNOSIS — N183 Chronic kidney disease, stage 3 (moderate): Secondary | ICD-10-CM | POA: Diagnosis not present

## 2015-06-12 ENCOUNTER — Telehealth: Payer: Self-pay | Admitting: Internal Medicine

## 2015-06-12 NOTE — Telephone Encounter (Signed)
New Message    Pt has questions :  What should she take for pain  She needs prescription for something to help her sleep  [facility]  She said she has been waiting for over a week for a call back, please call pt

## 2015-06-13 NOTE — Telephone Encounter (Signed)
[facility] called patient (automated) and she might have told them no she did not want the medication. Advised her to call back and speak with a representative to tell them she would like the eliquis sent to her. Adv she should also inquire about the cost due to the Tier exception process.  Advised her to use tylenol as needed for pain, no NSAIDs.  She verbalizes understanding.  Advised her Dr. Harrington Challenger does not prescribe sleep aids. She uses melatonin and it works but she does not want "to get hooked" on it. Her PCP advises her to exercise daily and she thinks that helps some.  Pt is satisfied with information provided. She is going out of town on Sunday for a week and states she has enough eliquis so that she will not run out or miss any doses while she is away.

## 2015-06-13 NOTE — Telephone Encounter (Signed)
Spoke with patient today.  See encounter 06/13/15 for details.

## 2015-06-13 NOTE — Telephone Encounter (Signed)
New Prob   Pt will be receiving an Eli[facility] soon. However, medication costs $105. Pt requested an appeal but is needing a letter from Dr. Harrington Challenger stating other medications have previously been attempted prior to going this Eliquis.   Fax to [phone] Attn: Appeal Department

## 2015-06-14 NOTE — Telephone Encounter (Signed)
Yes I am still working on it

## 2015-06-15 ENCOUNTER — Telehealth: Payer: Self-pay

## 2015-06-15 NOTE — Telephone Encounter (Signed)
Tier exception denied. It will cost her $150.00 per 90 days.

## 2015-06-15 NOTE — Telephone Encounter (Signed)
Spoke with [name] Dyer at [facility] about a tier reduction for Eliquis. Patient states she pays about $150.00 for a 90 day supply. No lower co-pay will be granted, and I will inform patient of this.

## 2015-06-15 NOTE — Telephone Encounter (Signed)
Follow Up    Pt is calling following up on previous mess. Notified pt that our office is still working on this per note below. States she would still like to speak to someone.

## 2015-06-20 ENCOUNTER — Telehealth: Payer: Self-pay

## 2015-06-20 NOTE — Telephone Encounter (Signed)
Left message for patient to call back.

## 2015-06-22 ENCOUNTER — Encounter: Payer: Self-pay | Admitting: *Deleted

## 2015-06-22 NOTE — Telephone Encounter (Signed)
Aware.  Will send letter

## 2015-06-22 NOTE — Telephone Encounter (Signed)
Letter saved in EPIC, hard copy to Guinevere Ferrari to forward.

## 2015-06-25 ENCOUNTER — Telehealth: Payer: Self-pay

## 2015-06-25 NOTE — Telephone Encounter (Signed)
Faxed an appeal request for a tier exception for hr eliquis

## 2015-07-09 IMAGING — CT CT ABD-PELV W/ CM
1 of 3 series · 13 of 32 positions shown, 18 images · IV contrast (OMNIPAQUE 300)
Comparison: None.

CLINICAL DATA: Abdominal pain, nausea, vomiting, diarrhea for 3
weeks, lower abdominal pain

EXAM:
CT ABDOMEN AND PELVIS WITH CONTRAST
TECHNIQUE: Multidetector CT imaging of the abdomen and pelvis was performed
using the standard protocol following bolus administration of
intravenous contrast.
CONTRAST:  100mL OMNIPAQUE IOHEXOL 300 MG/ML SOLN, 50mL OMNIPAQUE
IOHEXOL 300 MG/ML SOLN

[Series 2: abd/pel with · axial · 0.67mm/px · z∈[-491,-106]mm · 13 of 87 slices shown, 18 images]
[im 5/87  soft-tissue]
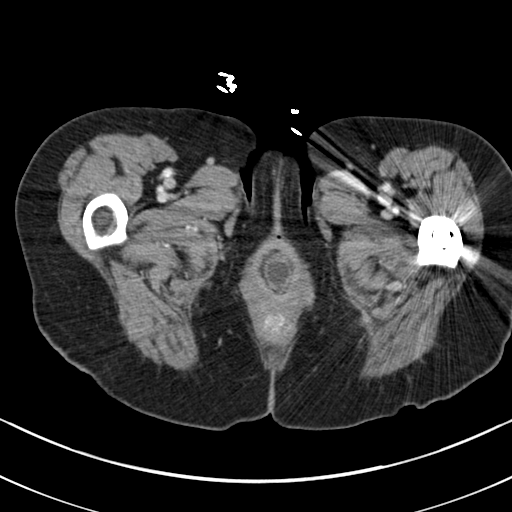
[im 5/87  bone]
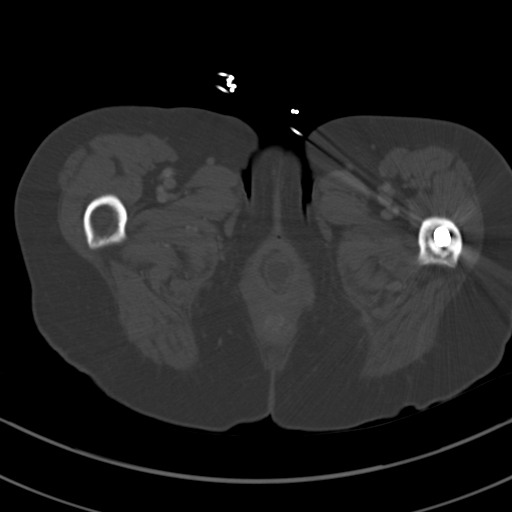
[im 13/87  soft-tissue]
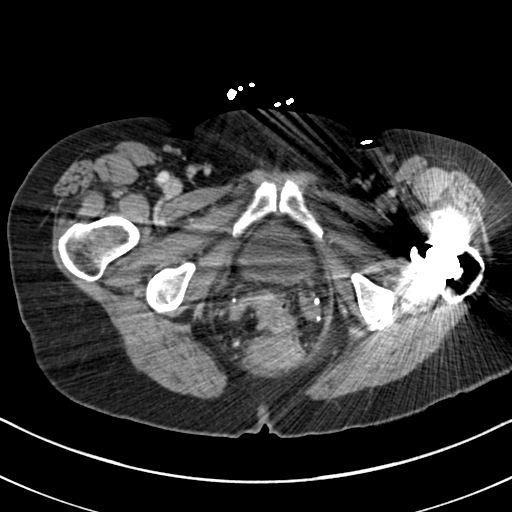
[im 18/87  soft-tissue]
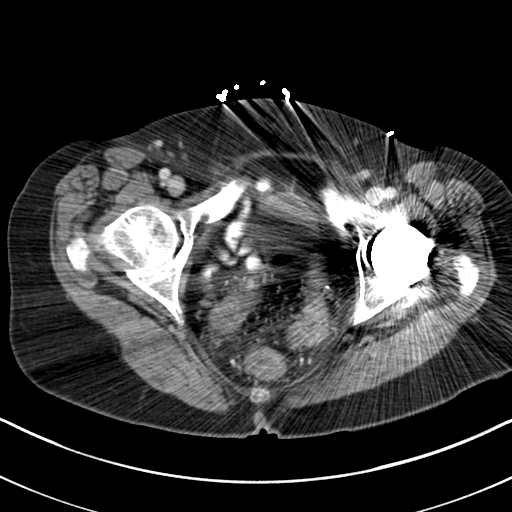
[im 26/87  soft-tissue]
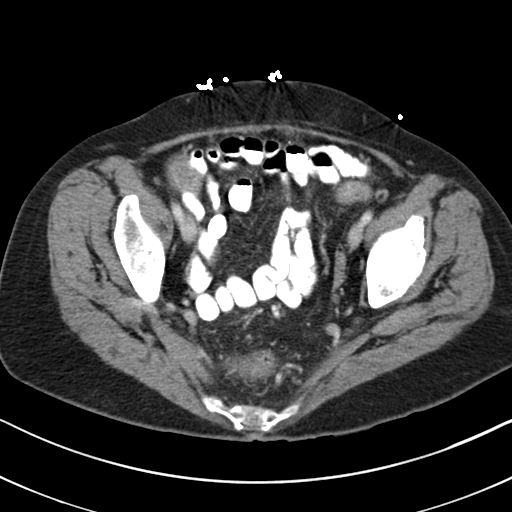
[im 35/87  soft-tissue]
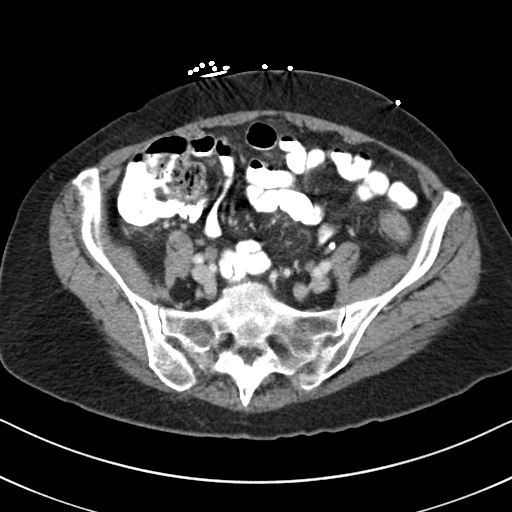
[im 39/87  soft-tissue]
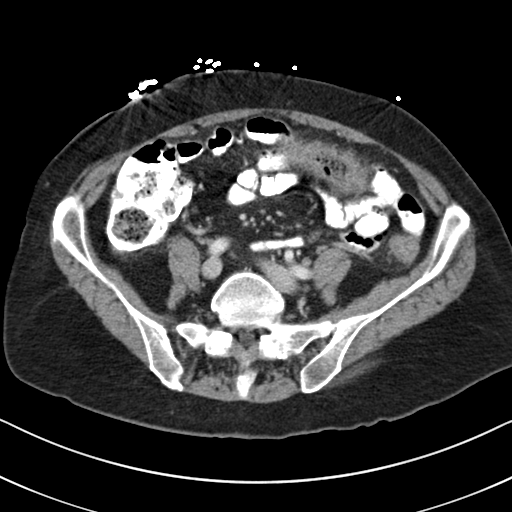
[im 48/87  soft-tissue]
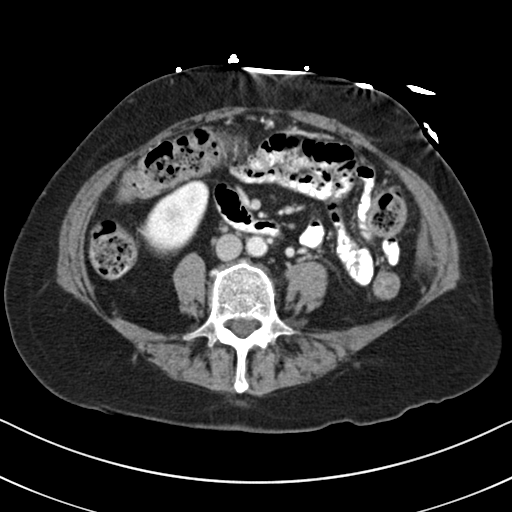
[im 52/87  soft-tissue]
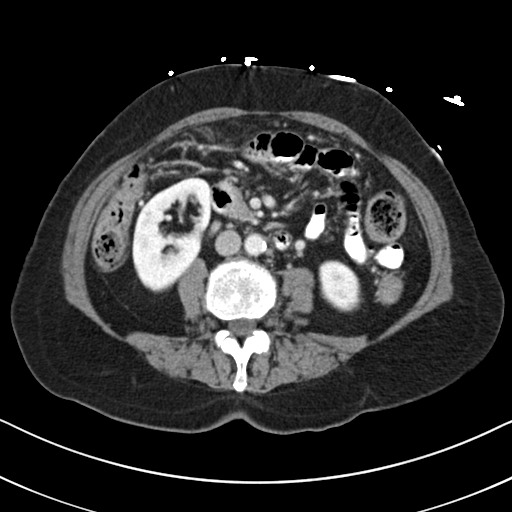
[im 61/87  soft-tissue]
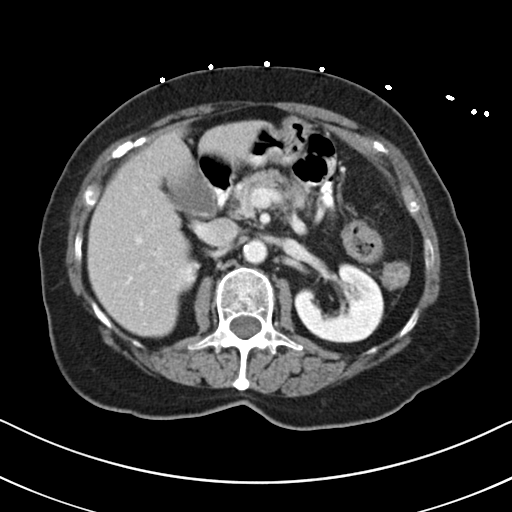
[im 61/87  bone]
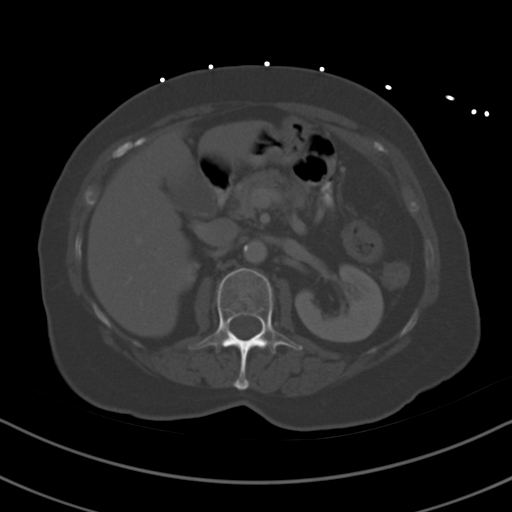
[im 69/87  soft-tissue]
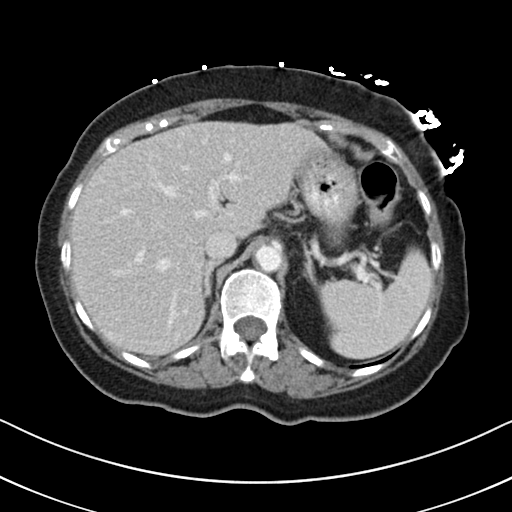
[im 69/87  lung]
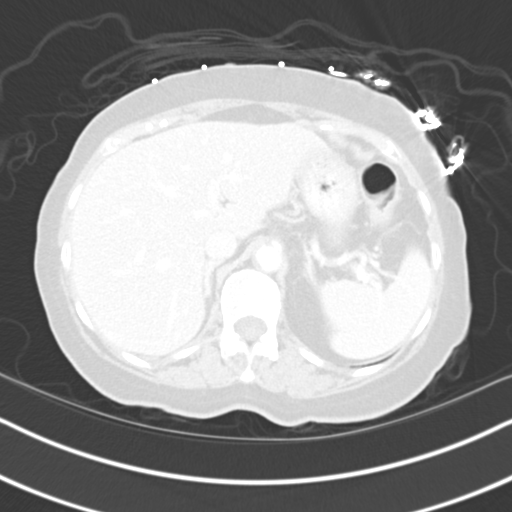
[im 74/87  soft-tissue]
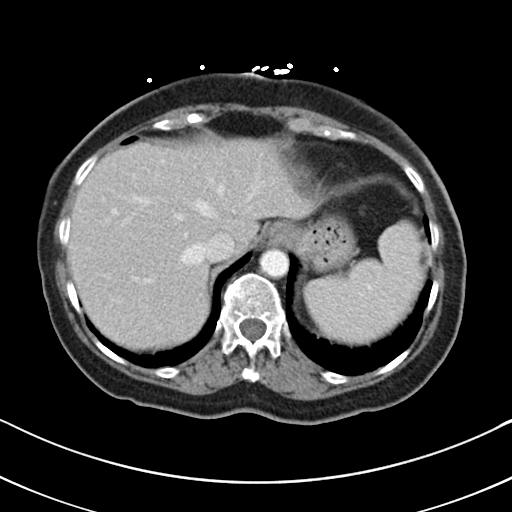
[im 74/87  lung]
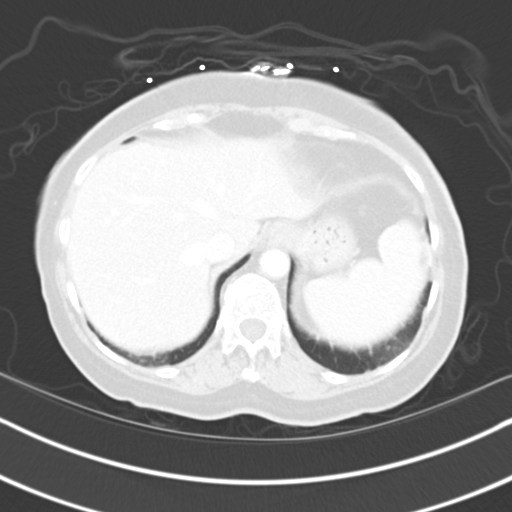
[im 78/87  lung]
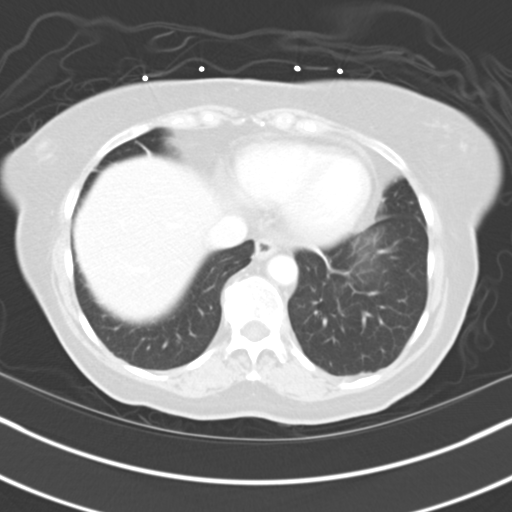
[im 82/87  soft-tissue]
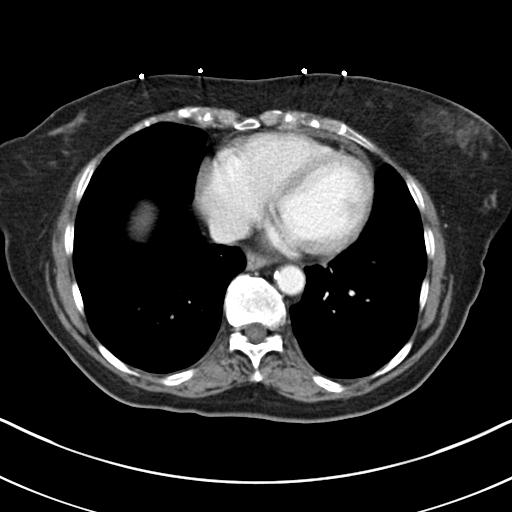
[im 82/87  lung]
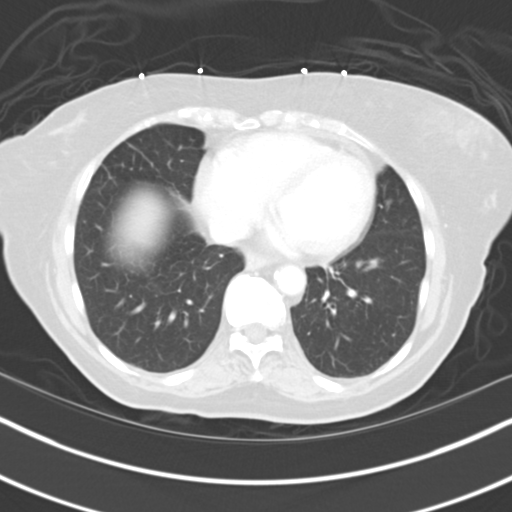

[13 of 32 positions shown; findings below may reference images not displayed]

FINDINGS: The lung bases are unremarkable. Sagittal images of the spine shows
degenerative changes lumbar spine. There is disc space flattening
with endplate sclerotic changes and vacuum disc phenomenon at
L2-L3-L3-L4 and L4-L5 level. Enhanced liver is unremarkable. The
pancreas, spleen and adrenal glands are unremarkable. No calcified
gallstones are noted within gallbladder.

Extensive atherosclerotic calcifications of abdominal aorta and
iliac arteries. No aortic aneurysm.

No small bowel obstruction. Moderate stool noted within cecum. There
is no pericecal inflammation. The patient is status post
appendectomy. The terminal ileum is unremarkable. There is some
stool noted within transverse colon. There is mild thickening of the
wall in distal transverse colon see axial image 46. There is
thickening of the wall in splenic flexure of the colon and
descending colon. Minimal stranding of pericolonic fat in left
colon. Mild thickening of sigmoid colon wall. Findings are
consistent with segmental colitis. There is no definite evidence of
small bowel obstruction.

This is confirmed in coronal images 31 and 36.

Evaluation of the pelvis is limited by extensive metallic artifacts
from left hip prosthesis.

Enhanced kidneys are symmetrical in size. No hydronephrosis or
hydroureter. Enhanced pancreas, spleen and adrenal glands are
unremarkable. Delayed renal images shows bilateral renal symmetrical
excretion. There is a cyst in midpole of the left kidney measures
7.4 mm. Bilateral visualized proximal ureter is unremarkable.

There is a cystocele in posterior inferior aspect of the urinary
bladder. Measures 2.3 cm. This is best seen in sagittal image 69.

The patient is status post hysterectomy.
IMPRESSION: 1. There is abnormal mild thickening of colonic wall in distal
transverse colon, splenic flexure of the colon descending colon and
sigmoid colon. Subtle mild stranding of pericolonic fat. Findings
are suspicious for long segment colitis. Clinical correlation is
necessary.
2. No pericecal inflammation.  Status post appendectomy.
3. Status post hysterectomy. Evaluation of the pelvis is limited by
metallic artifacts from left hip prosthesis.
4. There is a cystocele posterior inferior aspect of the urinary
bladder measures 2.3 cm. This is best visualized in sagittal image
69.
5. No hydronephrosis or hydroureter.
6. No small bowel obstruction.

## 2015-08-13 DIAGNOSIS — H02831 Dermatochalasis of right upper eyelid: Secondary | ICD-10-CM | POA: Diagnosis not present

## 2015-08-13 DIAGNOSIS — H02834 Dermatochalasis of left upper eyelid: Secondary | ICD-10-CM | POA: Diagnosis not present

## 2015-08-27 ENCOUNTER — Ambulatory Visit (INDEPENDENT_AMBULATORY_CARE_PROVIDER_SITE_OTHER): Payer: Medicare Other | Admitting: Internal Medicine

## 2015-08-27 ENCOUNTER — Encounter: Payer: Self-pay | Admitting: Internal Medicine

## 2015-08-27 VITALS — BP 110/60 | HR 89 | Ht 65.0 in | Wt 130.2 lb

## 2015-08-27 DIAGNOSIS — Z79899 Other long term (current) drug therapy: Secondary | ICD-10-CM | POA: Diagnosis not present

## 2015-08-27 DIAGNOSIS — R0602 Shortness of breath: Secondary | ICD-10-CM

## 2015-08-27 LAB — BASIC METABOLIC PANEL
BUN: 20 mg/dL (ref 6–23)
CO2: 28 mEq/L (ref 19–32)
Calcium: 9.4 mg/dL (ref 8.4–10.5)
Chloride: 105 mEq/L (ref 96–112)
Creatinine, Ser: 0.93 mg/dL (ref 0.40–1.20)
GFR: 61.43 mL/min (ref >60.00)
Glucose, Bld: 74 mg/dL (ref 70–99)
Potassium: 4.4 mEq/L (ref 3.5–5.1)
Sodium: 140 mEq/L (ref 135–145)

## 2015-08-27 LAB — CBC
HCT: 41.3 % (ref 36.0–46.0)
Hemoglobin: 13.5 g/dL (ref 12.0–15.0)
MCHC: 32.7 g/dL (ref 30.0–36.0)
MCV: 90.4 fl (ref 78.0–100.0)
Platelets: 177 10*3/uL (ref 150.0–400.0)
RBC: 4.57 Mil/uL (ref 3.87–5.11)
RDW: 15 % (ref 11.5–15.5)
WBC: 7.8 10*3/uL (ref 4.0–10.5)

## 2015-08-27 LAB — BRAIN NATRIURETIC PEPTIDE: Pro B Natriuretic peptide (BNP): 318 pg/mL — ABNORMAL HIGH (ref 0.0–100.0)

## 2015-08-27 NOTE — Patient Instructions (Signed)
Your physician wants you to follow-up in: 6 months with Dr Theressa Stamps will receive a reminder letter in the mail two months in advance. If you don't receive a letter, please call our office to schedule the follow-up appointment.   Lab work today     STOP Diltiazem ,then in 2 weeks wear 24 hr holter monitor to check heart rate   Your physician has recommended that you wear a holter monitor in 2 WEEKS for 24 hrs. Holter monitors are medical devices that record the heart's electrical activity. Doctors most often use these monitors to diagnose arrhythmias. Arrhythmias are problems with the speed or rhythm of the heartbeat. The monitor is a small, portable device. You can wear one while you do your normal daily activities. This is usually used to diagnose what is causing palpitations/syncope (passing out).

## 2015-08-27 NOTE — Progress Notes (Signed)
Cardiology Office Note   Date:  08/27/2015   ID:  AIVY AKTER, DOB 1932/05/03, MRN [id number]  PCP:  [name], MD  Cardiologist:   [name], MD   No chief complaint on file.  F/U of atrial fib     History of Present Illness: [name] Dyer is a 81 y.o. female with a history of atrial fib, HTN, HL  I saw her in [place]   Feels bad in morning  Tired  Not usually a tired person Feels good in afternoon   Cant do any work that is exertion because runs out of energy   Has done some exercise classes  Seems this may be giving her a little more energy     Current Outpatient Prescriptions  Medication Sig Dispense Refill  . apixaban (ELIQUIS) 5 MG TABS tablet Take 1 tablet (5 mg total) by mouth 2 (two) times daily. 180 tablet 3  . atenolol (TENORMIN) 25 MG tablet Take 25 mg by mouth daily.    . Calcium Carbonate 1500 MG TABS Take 2 tablets by mouth daily.     . Cholecalciferol (VITAMIN D3) 3000 UNITS TABS Take 5,000 mg by mouth daily.     Marland Kitchen diltiazem (CARDIZEM CD) 120 MG 24 hr capsule Take 1 capsule (120 mg total) by mouth daily. 30 capsule 6  . estradiol (ESTRACE) 1 MG tablet     . furosemide (LASIX) 20 MG tablet Furosemide 20 mg by mouth daily. In addition, every third (3rd) day add an extra 20 mg for a total daily dose of 40 mg. 120 tablet 3  . Glucosamine-Chondroit-Vit C-Mn (GLUCOSAMINE CHONDR 1500 COMPLX PO) Take 1 tablet by mouth 2 (two) times daily.    . Melatonin 5 MG CAPS Take 1 capsule by mouth daily.    . Misc Natural Products (ALLERGY RELEAF SYSTEM PO) Take 1 capsule by mouth daily.    . Nutritional Supplements (ESTROVEN PO) Take 0.5 tablets by mouth every other day.    . potassium chloride (K-DUR) 10 MEQ tablet Take one tablet by mouth daily, except on every third (3rd) day take two tablets for 20 meq. the same day that you take extra dose of Lasix 120 tablet 3  . simvastatin (ZOCOR) 20 MG tablet Take 20 mg by mouth daily. Pt. Currently not taking was told to  hold until she she Dr. Noah Delaine     No current facility-administered medications for this visit.    Allergies:   Sulfa antibiotics   Past Medical History  Diagnosis Date  . Anemia   . Arrhythmia   . Colon polyps   . Congestive heart failure   . Hyperlipidemia   . Hypertension   . UTI (lower urinary tract infection)   . Community acquired pneumonia Feb 2013    LLL  . Pleural effusion, right Feb 2013  . Osteoporosis   . SVT (supraventricular tachycardia) feb 2013  . Cataract     Past Surgical History  Procedure Laterality Date  . Abdominal hysterectomy  1983  . Appendectomy  1983  . Colonoscopy    . Upper gastrointestinal endoscopy    . Hemiarthroplasty hip  2011    Left femoral neck fracture     Social History:  The patient  reports that she has never smoked. She has never used smokeless tobacco. She reports that she does not drink alcohol or use illicit drugs.   Family History:  The patient's family history includes Colon polyps in her son; Stroke in  her father.    ROS:  Please see the history of present illness. All other systems are reviewed and  Negative to the above problem except as noted.    PHYSICAL EXAM: VS:  BP 110/60 mmHg  Pulse 89  Ht _0  (1.651 m)  Wt 130 lb 4 oz (59.081 kg)  BMI 21.67 kg/m2  SpO2 97%  GEN: Well nourished, well developed, in no acute distress HEENT: normal Neck: no JVD, carotid bruits, or masses Cardiac: Irreg irreg; no murmurs, rubs, or gallops,no edema  Respiratory:  clear to auscultation bilaterally, normal work of breathing GI: soft, nontender, nondistended, + BS  No hepatomegaly  MS: no deformity Moving all extremities   Skin: warm and dry, no rash Neuro:  Strength and sensation are intact Psych: euthymic mood, full affect   EKG:  EKG is not ordered today.   Lipid Panel No results found for: CHOL, TRIG, HDL, CHOLHDL, VLDL, LDLCALC, LDLDIRECT    Wt Readings from Last 3 Encounters:  08/27/15 130 lb 4 oz (59.081  kg)  05/07/15 130 lb 12.8 oz (59.33 kg)  04/09/15 132 lb 6.4 oz (60.056 kg)      ASSESSMENT AND PLAN:  1.  Atrial fib  WIll set up for 24 hour holter.  I am not convinced she would stay in SR or would feel different  Would stop cardiazem and continue attenolol  Set up for 24 hour holter.  Continue Eliquis.    2.  HTN  BP good  Check electrolytes.    3.  HL  Keep on statin.       Signed, [name], MD  08/27/2015 9:49 AM    [facility] [address], [place], [place]  [address] Phone: [phone]; Fax: [phone]

## 2015-08-29 ENCOUNTER — Other Ambulatory Visit: Payer: Self-pay | Admitting: *Deleted

## 2015-08-29 MED ORDER — FUROSEMIDE 20 MG PO TABS: 20.0000 mg | ORAL_TABLET | Freq: Every day | ORAL | Status: DC

## 2015-08-29 MED ORDER — POTASSIUM CHLORIDE ER 10 MEQ PO TBCR: 10.0000 meq | EXTENDED_RELEASE_TABLET | Freq: Every day | ORAL | Status: DC

## 2015-09-10 ENCOUNTER — Other Ambulatory Visit: Payer: Self-pay | Admitting: Internal Medicine

## 2015-09-10 ENCOUNTER — Ambulatory Visit (INDEPENDENT_AMBULATORY_CARE_PROVIDER_SITE_OTHER): Payer: Medicare Other

## 2015-09-10 DIAGNOSIS — I4891 Unspecified atrial fibrillation: Secondary | ICD-10-CM

## 2015-09-10 DIAGNOSIS — Z79899 Other long term (current) drug therapy: Secondary | ICD-10-CM

## 2015-09-12 ENCOUNTER — Telehealth: Payer: Self-pay | Admitting: Internal Medicine

## 2015-09-12 NOTE — Telephone Encounter (Signed)
Walk in pt form-paper dropped off for Dr.Ross-gave to [facility]

## 2015-09-13 DIAGNOSIS — Z23 Encounter for immunization: Secondary | ICD-10-CM | POA: Diagnosis not present

## 2015-09-18 ENCOUNTER — Telehealth: Payer: Self-pay | Admitting: *Deleted

## 2015-09-18 MED ORDER — ATENOLOL 25 MG PO TABS: 25.0000 mg | ORAL_TABLET | Freq: Two times a day (BID) | ORAL | Status: DC

## 2015-09-18 NOTE — Telephone Encounter (Signed)
PT AWARE NEW SCRIPT  SENT  TO [facility]  .[name] Dyer

## 2015-09-18 NOTE — Telephone Encounter (Signed)
-----  Message from [name] V, MD sent at 09/14/2015 11:10 AM EDT ----- Holter shows atrial fibrillation  Average HR is around 100  I would recomm taht she increase atenolol to 25 2x epr day for better control   Follow BP if can at hoome

## 2015-10-01 ENCOUNTER — Telehealth: Payer: Self-pay | Admitting: *Deleted

## 2015-10-01 NOTE — Telephone Encounter (Signed)
Received blood pressure readings patient dropped off. Pt recently started new medication, taking BID. Placed in folder for Dr. Harrington Challenger to review BP readings.

## 2015-10-10 ENCOUNTER — Telehealth: Payer: Self-pay | Admitting: *Deleted

## 2015-10-10 NOTE — Telephone Encounter (Signed)
Rodman Key, RN at 10/01/2015 1:56 PM     Status: Signed       Expand All Collapse All   Received blood pressure readings patient dropped off. Pt recently started new medication, taking BID. Placed in folder for Dr. Harrington Challenger to review BP readings       [name], MD   Sent: Fri October 05, 2015 1:00 PM    To: Rodman Key, RN        Message     BP readings are OK 100s to 120s  HR 70s to 100s  Call if problems with dizziness.

## 2015-10-10 NOTE — Telephone Encounter (Signed)
PATIENT INFORMED. SHE HAS NOT NOTICED ANY DIZZINESS. SHE DOES NOTE HER IRREGULAR HEARTBEAT MORE SO AT NIGHT WHEN SHE LIES DOWN.  ADVISED TO CHECK BP, HR AND CONTACT us IF SHE NOTICES DIZZINESS.

## 2015-10-16 DIAGNOSIS — J40 Bronchitis, not specified as acute or chronic: Secondary | ICD-10-CM | POA: Diagnosis not present

## 2015-10-24 ENCOUNTER — Telehealth: Payer: Self-pay | Admitting: *Deleted

## 2015-10-24 NOTE — Telephone Encounter (Signed)
Received request to hold eliquis for procedure from [facility], Dr. Dema Severin on 11/05/15. Placed in folder for Dr. Harrington Challenger to review.

## 2015-10-25 DIAGNOSIS — Z803 Family history of malignant neoplasm of breast: Secondary | ICD-10-CM | POA: Diagnosis not present

## 2015-10-25 DIAGNOSIS — Z1231 Encounter for screening mammogram for malignant neoplasm of breast: Secondary | ICD-10-CM | POA: Diagnosis not present

## 2015-10-26 ENCOUNTER — Telehealth: Payer: Self-pay | Admitting: *Deleted

## 2015-10-26 ENCOUNTER — Other Ambulatory Visit: Payer: Self-pay | Admitting: *Deleted

## 2015-10-26 MED ORDER — FUROSEMIDE 20 MG PO TABS: 20.0000 mg | ORAL_TABLET | Freq: Every day | ORAL | Status: DC

## 2015-10-26 MED ORDER — POTASSIUM CHLORIDE ER 10 MEQ PO TBCR: 10.0000 meq | EXTENDED_RELEASE_TABLET | Freq: Every day | ORAL | Status: DC

## 2015-10-26 NOTE — Telephone Encounter (Signed)
Follow Up   Pt is calling following up on call from earlier. Please call.

## 2015-10-26 NOTE — Telephone Encounter (Signed)
Was a no print script from 07/2015, has to redo the refill.

## 2015-10-26 NOTE — Telephone Encounter (Signed)
LVM FOR PT TO CALL BACK REGARDING SCRIPT REQUEST, WANTS SCRIPTS MOVED TO [facility], PT WILL HAVE TO GET [facility] TO REQUEST THE MOVE.

## 2015-10-26 NOTE — Telephone Encounter (Signed)
called to let [facility] know that [facility] have request the refills remaining on her medication. pt expressed understanding

## 2015-10-29 ENCOUNTER — Telehealth: Payer: Self-pay | Admitting: Internal Medicine

## 2015-10-29 NOTE — Telephone Encounter (Signed)
° ° °  FOLLOW UP     Request for surgical clearance:  1. What type of surgery is being performed? Eye lid surgery  2. When is this surgery scheduled? 11.7.16  3. Are there any medications that need to be held prior to surgery and how long?Eliquis 2-3 day  4. Name of physician performing surgery? Dr Dema Severin  5. What is your office phone and fax number? [phone] fax #.. Phone [phone]  Office stated they have faxed over numerous request and haven't gotten back a phone call. Please call.

## 2015-10-29 NOTE — Telephone Encounter (Signed)
Returned call to [place] with [facility] no answer.Left message on personal voice mail will send message to Dr.Ross for advice.

## 2015-10-29 NOTE — Telephone Encounter (Signed)
Please call patient back today.  She has surgery scheduled for 11/05/15 and she needs to know which meds to stop taking a week before.  She thinks it is Eliquis only??  Also she is very confused about her Lasix.  Please clarify how she should be taking it...only one a day  OR  one a day and an extra pill every 3 days.  Thank you!

## 2015-10-29 NOTE — Telephone Encounter (Signed)
Request for surgical clearance:  1. What type of surgery is being performed? Eye lid surgery  2. When is this surgery scheduled? 11.7.16  3. Are there any medications that need to be held prior to surgery and how long?Eliquis 2-3 day  4. Name of physician performing surgery? Dr Dema Severin   5. What is your office phone and fax number? [phone] fax #.. Phone [phone]  Office stated they have faxed over numerous request and haven't gotten back a phone call. Please call.

## 2015-10-29 NOTE — Telephone Encounter (Signed)
Will forward to Dr. Harrington Challenger for clearance.

## 2015-10-29 NOTE — Telephone Encounter (Signed)
Agree to hold lasix 2 days preceding surgery

## 2015-10-30 NOTE — Telephone Encounter (Signed)
Faxed signed request to hold Eliquis to [facility].  Called office and spoke with Elroy Channel there who will inform the patient.  Also called patient to clarify lasix dose. She has been taking 20 mg daily and that is the correct dose.  Advised the eye doctor will call to give directions on eliquis, she states they told her last week to hold starting next Saturday.

## 2015-11-02 ENCOUNTER — Other Ambulatory Visit: Payer: Self-pay

## 2015-11-02 MED ORDER — POTASSIUM CHLORIDE ER 10 MEQ PO TBCR: 10.0000 meq | EXTENDED_RELEASE_TABLET | Freq: Every day | ORAL | Status: DC

## 2015-11-02 MED ORDER — FUROSEMIDE 20 MG PO TABS: 20.0000 mg | ORAL_TABLET | Freq: Every day | ORAL | Status: DC

## 2015-11-02 MED ORDER — ATENOLOL 25 MG PO TABS: 25.0000 mg | ORAL_TABLET | Freq: Two times a day (BID) | ORAL | Status: DC

## 2015-11-05 DIAGNOSIS — H02831 Dermatochalasis of right upper eyelid: Secondary | ICD-10-CM | POA: Diagnosis not present

## 2015-11-05 DIAGNOSIS — H02834 Dermatochalasis of left upper eyelid: Secondary | ICD-10-CM | POA: Diagnosis not present

## 2015-12-04 DIAGNOSIS — N390 Urinary tract infection, site not specified: Secondary | ICD-10-CM | POA: Diagnosis not present

## 2015-12-04 DIAGNOSIS — I10 Essential (primary) hypertension: Secondary | ICD-10-CM | POA: Diagnosis not present

## 2015-12-04 DIAGNOSIS — E559 Vitamin D deficiency, unspecified: Secondary | ICD-10-CM | POA: Diagnosis not present

## 2015-12-04 DIAGNOSIS — M810 Age-related osteoporosis without current pathological fracture: Secondary | ICD-10-CM | POA: Diagnosis not present

## 2015-12-04 DIAGNOSIS — Z0000 Encounter for general adult medical examination without abnormal findings: Secondary | ICD-10-CM | POA: Diagnosis not present

## 2015-12-04 DIAGNOSIS — E785 Hyperlipidemia, unspecified: Secondary | ICD-10-CM | POA: Diagnosis not present

## 2015-12-04 DIAGNOSIS — Z23 Encounter for immunization: Secondary | ICD-10-CM | POA: Diagnosis not present

## 2015-12-04 DIAGNOSIS — Z1389 Encounter for screening for other disorder: Secondary | ICD-10-CM | POA: Diagnosis not present

## 2015-12-12 DIAGNOSIS — R300 Dysuria: Secondary | ICD-10-CM | POA: Diagnosis not present

## 2015-12-21 DIAGNOSIS — Z1272 Encounter for screening for malignant neoplasm of vagina: Secondary | ICD-10-CM | POA: Diagnosis not present

## 2016-02-20 DIAGNOSIS — I129 Hypertensive chronic kidney disease with stage 1 through stage 4 chronic kidney disease, or unspecified chronic kidney disease: Secondary | ICD-10-CM | POA: Diagnosis not present

## 2016-02-20 DIAGNOSIS — N183 Chronic kidney disease, stage 3 (moderate): Secondary | ICD-10-CM | POA: Diagnosis not present

## 2016-02-20 DIAGNOSIS — E785 Hyperlipidemia, unspecified: Secondary | ICD-10-CM | POA: Diagnosis not present

## 2016-02-20 DIAGNOSIS — M810 Age-related osteoporosis without current pathological fracture: Secondary | ICD-10-CM | POA: Diagnosis not present

## 2016-02-28 NOTE — Progress Notes (Signed)
Cardiology Office Note   Date:  02/29/2016   ID:  [name] Dyer, DOB August 15, 1932, MRN [id number]  PCP:  [name], MD  Cardiologist:   [name], MD   No chief complaint on file.  F/U of atrial fib and HTN     History of Present Illness: [name] Dyer is a 82 y.o. female with a history of atrial fib, HTN and HL  I saw her in Aug 2016  Set up for 24 hour holter  No SOB  Rare ededma in legs  No CP  Some palpitations  No dizziness    Outpatient Prescriptions Prior to Visit  Medication Sig Dispense Refill  . apixaban (ELIQUIS) 5 MG TABS tablet Take 1 tablet (5 mg total) by mouth 2 (two) times daily. 180 tablet 3  . atenolol (TENORMIN) 25 MG tablet Take 1 tablet (25 mg total) by mouth 2 (two) times daily. 60 tablet 11  . Calcium Carbonate 1500 MG TABS Take 2 tablets by mouth daily.     . Cholecalciferol (VITAMIN D3) 3000 UNITS TABS Take 5,000 mg by mouth every 30 (thirty) days.     Marland Kitchen estradiol (ESTRACE) 1 MG tablet Take 1 mg by mouth daily.     . furosemide (LASIX) 20 MG tablet Take 1 tablet (20 mg total) by mouth daily. . 90 tablet 3  . Glucosamine-Chondroit-Vit C-Mn (GLUCOSAMINE CHONDR 1500 COMPLX PO) Take 1 tablet by mouth 2 (two) times daily.    . Melatonin 5 MG CAPS Take 1 capsule by mouth daily.    . potassium chloride (K-DUR) 10 MEQ tablet Take 1 tablet (10 mEq total) by mouth daily. 90 tablet 3  . simvastatin (ZOCOR) 20 MG tablet Take 10 mg by mouth daily. Pt. Currently not taking was told to hold until she she Dr. Noah Delaine    . Misc Natural Products (ALLERGY RELEAF SYSTEM PO) Take 1 capsule by mouth daily. Reported on 02/29/2016    . Nutritional Supplements (ESTROVEN PO) Take 0.5 tablets by mouth every other day. Reported on 02/29/2016     No facility-administered medications prior to visit.     Allergies:   Sulfa antibiotics   Past Medical History  Diagnosis Date  . Anemia   . Arrhythmia   . Colon polyps   . Congestive heart failure ([place])   .  Hyperlipidemia   . Hypertension   . UTI (lower urinary tract infection)   . Community acquired pneumonia Feb 2013    LLL  . Pleural effusion, right Feb 2013  . Osteoporosis   . SVT (supraventricular tachycardia) ([place]) feb 2013  . Cataract     Past Surgical History  Procedure Laterality Date  . Abdominal hysterectomy  1983  . Appendectomy  1983  . Colonoscopy    . Upper gastrointestinal endoscopy    . Hemiarthroplasty hip  2011    Left femoral neck fracture     Social History:  The patient  reports that she has never smoked. She has never used smokeless tobacco. She reports that she does not drink alcohol or use illicit drugs.   Family History:  The patient's family history includes Colon polyps in her son; Stroke in her father.    ROS:  Please see the history of present illness. All other systems are reviewed and  Negative to the above problem except as noted.    PHYSICAL EXAM: VS:  BP 118/58 mmHg  Pulse 78  Ht 5' (1.524 m)  Wt 131 lb 12.8 oz (  59.784 kg)  BMI 25.74 kg/m2  SpO2 98%  GEN: Well nourished, well developed, in no acute distress HEENT: normal Neck: no JVD, carotid bruits, or masses Cardiac: RRR; no murmurs, rubs, or gallops,no edema  Respiratory:  clear to auscultation bilaterally, normal work of breathing GI: soft, nontender, nondistended, + BS  No hepatomegaly  MS: no deformity Moving all extremities   Skin: warm and dry, no rash Neuro:  Strength and sensation are intact Psych: euthymic mood, full affect   EKG:  EKG is not ordered today.   Lipid Panel No results found for: CHOL, TRIG, HDL, CHOLHDL, VLDL, LDLCALC, LDLDIRECT    Wt Readings from Last 3 Encounters:  02/29/16 131 lb 12.8 oz (59.784 kg)  08/27/15 130 lb 4 oz (59.081 kg)  05/07/15 130 lb 12.8 oz (59.33 kg)      ASSESSMENT AND PLAN:  1  Atrial fib  Keep on same meds  CBC and BMET are OK in December  2.  HTN  Good control  3.  HL  LDL is very good at 47  Keeep on simvistatin      Current medicines are reviewed at length with the patient today.  The patient does not have concerns regarding medicines.   Disposition:   FU with  In November/December   Signed, [name], MD  02/29/2016 8:22 AM    [facility] [address], [place], [place]  [address] Phone: [phone]; Fax: [phone]

## 2016-02-29 ENCOUNTER — Ambulatory Visit (INDEPENDENT_AMBULATORY_CARE_PROVIDER_SITE_OTHER): Payer: Medicare Other | Admitting: Internal Medicine

## 2016-02-29 ENCOUNTER — Encounter: Payer: Self-pay | Admitting: Internal Medicine

## 2016-02-29 VITALS — BP 118/58 | HR 78 | Ht 60.0 in | Wt 131.8 lb

## 2016-02-29 DIAGNOSIS — I480 Paroxysmal atrial fibrillation: Secondary | ICD-10-CM | POA: Diagnosis not present

## 2016-02-29 DIAGNOSIS — I10 Essential (primary) hypertension: Secondary | ICD-10-CM | POA: Diagnosis not present

## 2016-02-29 DIAGNOSIS — E785 Hyperlipidemia, unspecified: Secondary | ICD-10-CM

## 2016-02-29 NOTE — Patient Instructions (Signed)
Your physician recommends that you continue on your current medications as directed. Please refer to the Current Medication list given to you today. Your physician wants you to follow-up in: November, 2017 with Dr. Harrington Challenger.  You will receive a reminder letter in the mail two months in advance. If you don't receive a letter, please call our office to schedule the follow-up appointment.

## 2016-04-02 DIAGNOSIS — B351 Tinea unguium: Secondary | ICD-10-CM | POA: Diagnosis not present

## 2016-06-02 ENCOUNTER — Encounter (HOSPITAL_COMMUNITY): Payer: Self-pay | Admitting: Emergency Medicine

## 2016-06-02 ENCOUNTER — Emergency Department (HOSPITAL_COMMUNITY)
Admission: EM | Admit: 2016-06-02 | Discharge: 2016-06-03 | Disposition: A | Discharge status: Home / Self Care | Payer: Medicare Other | Attending: Emergency Medicine | Admitting: Emergency Medicine

## 2016-06-02 ENCOUNTER — Emergency Department (HOSPITAL_COMMUNITY): Payer: Medicare Other

## 2016-06-02 DIAGNOSIS — I509 Heart failure, unspecified: Secondary | ICD-10-CM | POA: Insufficient documentation

## 2016-06-02 DIAGNOSIS — I110 Hypertensive heart disease with heart failure: Secondary | ICD-10-CM | POA: Insufficient documentation

## 2016-06-02 DIAGNOSIS — K921 Melena: Secondary | ICD-10-CM | POA: Insufficient documentation

## 2016-06-02 DIAGNOSIS — Z7901 Long term (current) use of anticoagulants: Secondary | ICD-10-CM | POA: Insufficient documentation

## 2016-06-02 DIAGNOSIS — K922 Gastrointestinal hemorrhage, unspecified: Secondary | ICD-10-CM | POA: Diagnosis not present

## 2016-06-02 DIAGNOSIS — R109 Unspecified abdominal pain: Secondary | ICD-10-CM | POA: Diagnosis not present

## 2016-06-02 LAB — COMPREHENSIVE METABOLIC PANEL
ALT: 23 U/L (ref 14–54)
AST: 25 U/L (ref 15–41)
Albumin: 3.8 g/dL (ref 3.5–5.0)
Alkaline Phosphatase: 69 U/L (ref 38–126)
Anion gap: 6 (ref 5–15)
BUN: 13 mg/dL (ref 6–20)
CO2: 24 mmol/L (ref 22–32)
Calcium: 8.7 mg/dL — ABNORMAL LOW (ref 8.9–10.3)
Chloride: 106 mmol/L (ref 101–111)
Creatinine, Ser: 0.94 mg/dL (ref 0.44–1.00)
GFR calc Af Amer: >60 mL/min (ref >60)
GFR calc non Af Amer: 55 mL/min — ABNORMAL LOW (ref >60)
Glucose, Bld: 102 mg/dL — ABNORMAL HIGH (ref 65–99)
Potassium: 4.1 mmol/L (ref 3.5–5.1)
Sodium: 136 mmol/L (ref 135–145)
Total Bilirubin: 1.2 mg/dL (ref 0.3–1.2)
Total Protein: 6.8 g/dL (ref 6.5–8.1)

## 2016-06-02 LAB — CBC
HCT: 38.7 % (ref 36.0–46.0)
Hemoglobin: 12.9 g/dL (ref 12.0–15.0)
MCH: 29.8 pg (ref 26.0–34.0)
MCHC: 33.3 g/dL (ref 30.0–36.0)
MCV: 89.4 fL (ref 78.0–100.0)
Platelets: UNDETERMINED 10*3/uL (ref 150–400)
RBC: 4.33 MIL/uL (ref 3.87–5.11)
RDW: 14 % (ref 11.5–15.5)
WBC: 8.6 10*3/uL (ref 4.0–10.5)

## 2016-06-02 LAB — TYPE AND SCREEN
ABO/RH(D): B POS
Antibody Screen: NEGATIVE

## 2016-06-02 NOTE — ED Provider Notes (Signed)
CSN: [id number]     Arrival date & time 06/02/16  1827 History   First MD Initiated Contact with Patient 06/02/16 2201     Chief Complaint  Patient presents with  . Blood In Stools     (Consider location/radiation/quality/duration/timing/severity/associated sxs/prior Treatment) HPI Patient presents with concern of abdominal discomfort, diarrhea. Symptoms of been present for about one week, but notably, the patient has had diarrhea for about 10 hours, notes that over the past days her bowel movements have diminished after initially having multiple episodes each day, probably early in the morning. Patient has had a red content in stool. Patient claims of intermittent lower abdominal discomfort, denies nausea, vomiting, anorexia, fever. Patient has a notable history of atrial fibrillation, uses eliquis.  Last c-scope ~5y pta.   Past Medical History  Diagnosis Date  . Anemia   . Arrhythmia   . Colon polyps   . Congestive heart failure ([place])   . Hyperlipidemia   . Hypertension   . UTI (lower urinary tract infection)   . Community acquired pneumonia Feb 2013    LLL  . Pleural effusion, right Feb 2013  . Osteoporosis   . SVT (supraventricular tachycardia) ([place]) feb 2013  . Cataract    Past Surgical History  Procedure Laterality Date  . Abdominal hysterectomy  1983  . Appendectomy  1983  . Colonoscopy    . Upper gastrointestinal endoscopy    . Hemiarthroplasty hip  2011    Left femoral neck fracture   Family History  Problem Relation Age of Onset  . Colon polyps Son   . Stroke Father    Social History  Substance Use Topics  . Smoking status: Never Smoker   . Smokeless tobacco: Never Used  . Alcohol Use: No   OB History    No data available     Review of Systems  Constitutional:       Per HPI, otherwise negative  HENT:       Per HPI, otherwise negative  Respiratory:       Per HPI, otherwise negative  Cardiovascular:       Per HPI, otherwise negative   Gastrointestinal: Positive for abdominal pain and blood in stool. Negative for vomiting.  Endocrine:       Negative aside from HPI  Genitourinary:       Neg aside from HPI   Musculoskeletal:       Per HPI, otherwise negative  Skin: Negative.   Neurological: Negative for syncope and weakness.      Allergies  Sulfa antibiotics  Home Medications   Prior to Admission medications   Medication Sig Start Date End Date Taking? Authorizing Provider  apixaban (ELIQUIS) 5 MG TABS tablet Take 1 tablet (5 mg total) by mouth 2 (two) times daily. 06/08/15  Yes [name], MD  atenolol (TENORMIN) 25 MG tablet Take 1 tablet (25 mg total) by mouth 2 (two) times daily. 11/02/15  Yes [name], MD  calcium-vitamin D (OSCAL WITH D) 500-200 MG-UNIT tablet Take 1 tablet by mouth 2 (two) times daily.   Yes Historical Provider, MD  estradiol (ESTRACE) 1 MG tablet Take 0.5 mg by mouth every other day.  07/24/15  Yes Historical Provider, MD  furosemide (LASIX) 20 MG tablet Take 1 tablet (20 mg total) by mouth daily. . 11/02/15  Yes [name], MD  Glucosamine-Chondroit-Vit C-Mn (GLUCOSAMINE CHONDR 1500 COMPLX PO) Take 1 tablet by mouth 2 (two) times daily.   Yes Historical Provider,  MD  loperamide (IMODIUM A-D) 2 MG tablet Take 2 mg by mouth 4 (four) times daily as needed for diarrhea or loose stools.   Yes Historical Provider, MD  Melatonin 5 MG CAPS Take 1 capsule by mouth at bedtime.    Yes Historical Provider, MD  potassium chloride (K-DUR) 10 MEQ tablet Take 1 tablet (10 mEq total) by mouth daily. 11/02/15  Yes [name], MD  simvastatin (ZOCOR) 20 MG tablet Take 10 mg by mouth daily.    Yes Historical Provider, MD   BP 137/77 mmHg  Pulse 94  Temp(Src) 98.6 F (37 C) (Oral)  Resp 16  SpO2 96% Physical Exam  Constitutional: She is oriented to person, place, and time. She appears well-developed and well-nourished. No distress.  HENT:  Head: Normocephalic and atraumatic.  Eyes: Conjunctivae  and EOM are normal.  Cardiovascular: Normal rate and regular rhythm.   Pulmonary/Chest: Effort normal and breath sounds normal. No stridor. No respiratory distress.  Abdominal: She exhibits no distension.  Minimal lower abd pain, no guarding, rebound.  Genitourinary: Guaiac negative stool.  Musculoskeletal: She exhibits no edema.  Neurological: She is alert and oriented to person, place, and time. No cranial nerve deficit.  Skin: Skin is warm and dry.  Psychiatric: She has a normal mood and affect.  Nursing note and vitals reviewed.   ED Course  Procedures (including critical care time) Labs Review Labs Reviewed  COMPREHENSIVE METABOLIC PANEL - Abnormal; Notable for the following:    Glucose, Bld 102 (*)    Calcium 8.7 (*)    GFR calc non Af Amer 55 (*)    All other components within normal limits  CBC  POC OCCULT BLOOD, ED  TYPE AND SCREEN  ABO/RH    Imaging Review Dg Abd Acute W/chest  06/02/2016  CLINICAL DATA:  82 year old female with abdominal pain and diarrhea. EXAM: DG ABDOMEN ACUTE W/ 1V CHEST COMPARISON:  CT dated 03/22/2015 FINDINGS: The lungs are clear. There is no pleural effusion or pneumothorax. The cardiac silhouette is within normal limits. Moderate stool throughout the colon. There is no bowel dilatation or evidence of obstruction. No free air or radiopaque calculi. There is osteopenia with degenerative changes of spine. Left hip arthroplasty. No acute fracture. IMPRESSION: Negative abdominal radiographs.  No acute cardiopulmonary disease. Electronically Signed   By: Anner Crete M.D.   On: 06/02/2016 23:03   I have personally reviewed and evaluated these images and lab results as part of my medical decision-making.  12:24 AM Patient sitting upright, no ongoing complaints, no additional bowel movements. She is eating a sandwich. We discussed all findings, need to follow-up with both primary care and to GI.   MDM  Patient presents with bloody stool. Here  she is awake and alert, with mild abdominal pain, but no evidence for peritonitis, sepsis or bacteremia. [name] hemoglobin value, vital signs, and consistent with substantial hemorrhage. Patient may have had mild colitis, but no evidence for ongoing infectious pathology. Patient was tolerant of oral intake here, with no ongoing bowel movements, bleeding, she was discharged in stable condition with primary care and GI follow-up.  Carmin Muskrat, MD 06/03/16 Laureen Abrahams

## 2016-06-02 NOTE — ED Notes (Signed)
Pt states that she has had 1 week of diarrhea and it looks like 'red jello' but has not seen copious amounts of blood. Sent in for eval by doctor since she's on eliquis. Alert and oriented.

## 2016-06-02 NOTE — ED Notes (Signed)
Pt on Eliquis sent to ED for bloody, gelatinous diarrhea x 1 week, lower abdominal pain.

## 2016-06-03 ENCOUNTER — Telehealth: Payer: Self-pay | Admitting: Internal Medicine

## 2016-06-03 LAB — ABO/RH: ABO/RH(D): B POS

## 2016-06-03 NOTE — Telephone Encounter (Signed)
Post ED visit yesterday for rectal bleeding.  She will come in and see see Alonza Bogus, PA at 2:30 tomorrow.

## 2016-06-03 NOTE — Discharge Instructions (Signed)
As discussed, your evaluation today has been largely reassuring.  But, it is important that you monitor your condition carefully, and do not hesitate to return to the ED if you develop new, or concerning changes in your condition.  Otherwise, please follow-up with your physician for appropriate ongoing care.   Gastrointestinal Bleeding Gastrointestinal (GI) bleeding means there is bleeding somewhere along the digestive tract, between the mouth and anus. CAUSES  There are many different problems that can cause GI bleeding. Possible causes include:  Esophagitis. This is inflammation, irritation, or swelling of the esophagus.  Hemorrhoids.These are veins that are full of blood (engorged) in the rectum. They cause pain, inflammation, and may bleed.  Anal fissures.These are areas of painful tearing which may bleed. They are often caused by passing hard stool.  Diverticulosis.These are pouches that form on the colon over time, with age, and may bleed significantly.  Diverticulitis.This is inflammation in areas with diverticulosis. It can cause pain, fever, and bloody stools, although bleeding is rare.  Polyps and cancer. Colon cancer often starts out as precancerous polyps.  Gastritis and ulcers.Bleeding from the upper gastrointestinal tract (near the stomach) may travel through the intestines and produce black, sometimes tarry, often bad smelling stools. In certain cases, if the bleeding is fast enough, the stools may not be black, but red. This condition may be life-threatening. SYMPTOMS   Vomiting bright red blood or material that looks like coffee grounds.  Bloody, black, or tarry stools. DIAGNOSIS  Your caregiver may diagnose your condition by taking your history and performing a physical exam. More tests may be needed, including:  X-rays and other imaging tests.  Esophagogastroduodenoscopy (EGD). This test uses a flexible, lighted tube to look at your esophagus, stomach, and  small intestine.  Colonoscopy. This test uses a flexible, lighted tube to look at your colon. TREATMENT  Treatment depends on the cause of your bleeding.   For bleeding from the esophagus, stomach, small intestine, or colon, the caregiver doing your EGD or colonoscopy may be able to stop the bleeding as part of the procedure.  Inflammation or infection of the colon can be treated with medicines.  Many rectal problems can be treated with creams, suppositories, or warm baths.  Surgery is sometimes needed.  Blood transfusions are sometimes needed if you have lost a lot of blood. If bleeding is slow, you may be allowed to go home. If there is a lot of bleeding, you will need to stay in the hospital for observation. HOME CARE INSTRUCTIONS   Take any medicines exactly as prescribed.  Keep your stools soft by eating foods that are high in fiber. These foods include whole grains, legumes, fruits, and vegetables. Prunes (1 to 3 a day) work well for many people.  Drink enough fluids to keep your urine clear or pale yellow. SEEK IMMEDIATE MEDICAL CARE IF:   Your bleeding increases.  You feel lightheaded, weak, or you faint.  You have severe cramps in your back or abdomen.  You pass large blood clots in your stool.  Your problems are getting worse. MAKE SURE YOU:   Understand these instructions.  Will watch your condition.  Will get help right away if you are not doing well or get worse.   This information is not intended to replace advice given to you by your health care provider. Make sure you discuss any questions you have with your health care provider.   Document Released: 12/12/2000 Document Revised: 12/01/2012 Document Reviewed: 06/04/2015 Elsevier Interactive  Patient Education 2016 [employer].

## 2016-06-04 ENCOUNTER — Other Ambulatory Visit: Payer: Medicare Other

## 2016-06-04 ENCOUNTER — Encounter: Payer: Self-pay | Admitting: Gastroenterology

## 2016-06-04 ENCOUNTER — Ambulatory Visit (INDEPENDENT_AMBULATORY_CARE_PROVIDER_SITE_OTHER): Payer: Medicare Other | Admitting: Gastroenterology

## 2016-06-04 VITALS — BP 108/50 | HR 62 | Ht 60.0 in | Wt 131.0 lb

## 2016-06-04 DIAGNOSIS — R1084 Generalized abdominal pain: Secondary | ICD-10-CM | POA: Diagnosis not present

## 2016-06-04 DIAGNOSIS — R197 Diarrhea, unspecified: Secondary | ICD-10-CM | POA: Diagnosis not present

## 2016-06-04 MED ORDER — METRONIDAZOLE 500 MG PO TABS: 500.0000 mg | ORAL_TABLET | Freq: Two times a day (BID) | ORAL | Status: DC

## 2016-06-04 NOTE — Progress Notes (Signed)
06/04/2016 [name] Dyer [id number] 08/05/1932   HISTORY OF PRESENT ILLNESS:  This is a pleasant 82 year old female who is known to Dr. Carlean Purl in April 2013 for complaints of rectal bleeding, suspected to be from hemorrhoids. Her last colonoscopy was in March 2011 in [place] at which time she was found have diverticulosis, hemorrhoids, and a couple of polyps that were removed.  She comes to our office today with complaints of diarrhea and abdominal pain. She tells me that this began 2 weeks ago and prior to that she was feeling well and moving her bowels normally. She says that most days she is having 10-15 bowel movements a day.  She saw a small amount of bright red blood on the toilet paper on a couple of occasions. She has some diffuse lower abdominal discomfort. She's had nausea but no vomiting. She denies any recent travel or antibiotic use. She was in the emergency department 2 days ago where CBC and CMP were normal.  She felt better after some initial treatment in the emergency room so was discharged and told to follow up with GI. Interestingly, a year ago she had some "colitis" on CT scan when she presented with symptoms of diarrhea to the emergency department at that time. She was treated with a course of Flagyl empirically and never had GI follow-up. She tells me that apparently her symptoms resolved because she has not had any issues again until recently.  He was referred here by Dr. Vanita Panda, [place].   Past Medical History  Diagnosis Date  . Anemia   . Arrhythmia   . Colon polyps   . Congestive heart failure ([place])   . Hyperlipidemia   . Hypertension   . UTI (lower urinary tract infection)   . Community acquired pneumonia Feb 2013    LLL  . Pleural effusion, right Feb 2013  . Osteoporosis   . SVT (supraventricular tachycardia) ([place]) feb 2013  . Cataract    Past Surgical History  Procedure Laterality Date  . Abdominal hysterectomy  1983  . Appendectomy  1983  .  Colonoscopy    . Upper gastrointestinal endoscopy    . Hemiarthroplasty hip  2011    Left femoral neck fracture    reports that she has never smoked. She has never used smokeless tobacco. She reports that she does not drink alcohol or use illicit drugs. family history includes Colon polyps in her son; Stroke in her father. Allergies  Allergen Reactions  . Sulfa Antibiotics Hives    Severe Rash      Outpatient Encounter Prescriptions as of 06/04/2016  Medication Sig  . apixaban (ELIQUIS) 5 MG TABS tablet Take 1 tablet (5 mg total) by mouth 2 (two) times daily.  Marland Kitchen atenolol (TENORMIN) 25 MG tablet Take 1 tablet (25 mg total) by mouth 2 (two) times daily.  . calcium-vitamin D (OSCAL WITH D) 500-200 MG-UNIT tablet Take 1 tablet by mouth 2 (two) times daily.  Marland Kitchen estradiol (ESTRACE) 1 MG tablet Take 0.5 mg by mouth every other day.   . furosemide (LASIX) 20 MG tablet Take 1 tablet (20 mg total) by mouth daily. .  . Glucosamine-Chondroit-Vit C-Mn (GLUCOSAMINE CHONDR 1500 COMPLX PO) Take 1 tablet by mouth 2 (two) times daily.  . Melatonin 5 MG CAPS Take 1 capsule by mouth at bedtime.   . potassium chloride (K-DUR) 10 MEQ tablet Take 1 tablet (10 mEq total) by mouth daily.  . simvastatin (ZOCOR) 20 MG tablet Take 10 mg  by mouth daily.   Marland Kitchen loperamide (IMODIUM A-D) 2 MG tablet Take 2 mg by mouth 4 (four) times daily as needed for diarrhea or loose stools. Reported on 06/04/2016  . metroNIDAZOLE (FLAGYL) 500 MG tablet Take 1 tablet (500 mg total) by mouth 2 (two) times daily.   No facility-administered encounter medications on file as of 06/04/2016.     REVIEW OF SYSTEMS  : All other systems reviewed and negative except where noted in the History of Present Illness.   PHYSICAL EXAM: BP 108/50 mmHg  Pulse 62  Ht 5' (1.524 m)  Wt 131 lb (59.421 kg)  BMI 25.58 kg/m2 General: Well developed white female in no acute distress Head: Normocephalic and atraumatic Eyes:  Sclerae anicteric, conjunctiva  pink. Ears: Normal auditory acuity Lungs: Clear throughout to auscultation Heart: Regular rate and rhythm Abdomen: Soft, non-distended.  Normal bowel sounds.  Mild diffuse TTP. Musculoskeletal: Symmetrical with no gross deformities  Skin: No lesions on visible extremities Extremities: No edema  Neurological: Alert oriented x 4, grossly non-focal Psychological:  Alert and cooperative. Normal mood and affect  ASSESSMENT AND PLAN: -82 year old female with 2 weeks of diarrhea and abdominal pain:  I suspect that this is likely infectious in origin. Interestingly, she has a history of "colitis" on a CT scan 1 year ago that was treated empirically with Flagyl. Symptoms obviously resolved and she was feeling well until 2 weeks ago. We'll start by checking some stool studies, GI pathogen panel and O&P. I will empirically treat her with another course of Flagyl 500 mg twice a day for 10 days. I have advised her that even though this is considered an acute diarrhea that if her symptoms continue, we may have to consider colonoscopy especially in light of similar symptoms previously. She clearly is not a fan of performing colonoscopy unless necessary.  CC:  No ref. provider found

## 2016-06-04 NOTE — Patient Instructions (Signed)
We have sent the following medications to your pharmacy for you to pick up at your convenience: Metronidazole 522m   Your physician has requested that you go to the basement for the following lab work before leaving today: Stool studies

## 2016-06-06 ENCOUNTER — Telehealth: Payer: Self-pay | Admitting: *Deleted

## 2016-06-06 ENCOUNTER — Encounter (HOSPITAL_COMMUNITY): Payer: Self-pay | Admitting: *Deleted

## 2016-06-06 ENCOUNTER — Inpatient Hospital Stay (HOSPITAL_COMMUNITY)
Admission: EM | Admit: 2016-06-06 | Discharge: 2016-06-12 | DRG: 371 | Disposition: A | Discharge status: Home / Self Care | Payer: Medicare Other | Attending: Internal Medicine | Admitting: Internal Medicine

## 2016-06-06 ENCOUNTER — Other Ambulatory Visit: Payer: Medicare Other

## 2016-06-06 ENCOUNTER — Emergency Department (HOSPITAL_COMMUNITY): Payer: Medicare Other

## 2016-06-06 DIAGNOSIS — K529 Noninfective gastroenteritis and colitis, unspecified: Secondary | ICD-10-CM | POA: Diagnosis not present

## 2016-06-06 DIAGNOSIS — R1084 Generalized abdominal pain: Secondary | ICD-10-CM | POA: Diagnosis not present

## 2016-06-06 DIAGNOSIS — A047 Enterocolitis due to Clostridium difficile: Principal | ICD-10-CM | POA: Diagnosis present

## 2016-06-06 DIAGNOSIS — Z8371 Family history of colonic polyps: Secondary | ICD-10-CM

## 2016-06-06 DIAGNOSIS — E860 Dehydration: Secondary | ICD-10-CM | POA: Diagnosis present

## 2016-06-06 DIAGNOSIS — R109 Unspecified abdominal pain: Secondary | ICD-10-CM | POA: Diagnosis not present

## 2016-06-06 DIAGNOSIS — I272 Other secondary pulmonary hypertension: Secondary | ICD-10-CM | POA: Diagnosis not present

## 2016-06-06 DIAGNOSIS — G9340 Encephalopathy, unspecified: Secondary | ICD-10-CM | POA: Diagnosis not present

## 2016-06-06 DIAGNOSIS — K761 Chronic passive congestion of liver: Secondary | ICD-10-CM | POA: Diagnosis present

## 2016-06-06 DIAGNOSIS — D696 Thrombocytopenia, unspecified: Secondary | ICD-10-CM | POA: Diagnosis present

## 2016-06-06 DIAGNOSIS — E871 Hypo-osmolality and hyponatremia: Secondary | ICD-10-CM | POA: Diagnosis present

## 2016-06-06 DIAGNOSIS — Z789 Other specified health status: Secondary | ICD-10-CM

## 2016-06-06 DIAGNOSIS — A09 Infectious gastroenteritis and colitis, unspecified: Secondary | ICD-10-CM | POA: Diagnosis not present

## 2016-06-06 DIAGNOSIS — K648 Other hemorrhoids: Secondary | ICD-10-CM | POA: Diagnosis present

## 2016-06-06 DIAGNOSIS — Z90710 Acquired absence of both cervix and uterus: Secondary | ICD-10-CM

## 2016-06-06 DIAGNOSIS — M810 Age-related osteoporosis without current pathological fracture: Secondary | ICD-10-CM | POA: Diagnosis present

## 2016-06-06 DIAGNOSIS — I480 Paroxysmal atrial fibrillation: Secondary | ICD-10-CM | POA: Diagnosis present

## 2016-06-06 DIAGNOSIS — R197 Diarrhea, unspecified: Secondary | ICD-10-CM

## 2016-06-06 DIAGNOSIS — G9341 Metabolic encephalopathy: Secondary | ICD-10-CM | POA: Diagnosis not present

## 2016-06-06 DIAGNOSIS — I110 Hypertensive heart disease with heart failure: Secondary | ICD-10-CM | POA: Diagnosis present

## 2016-06-06 DIAGNOSIS — K644 Residual hemorrhoidal skin tags: Secondary | ICD-10-CM | POA: Diagnosis present

## 2016-06-06 DIAGNOSIS — Z882 Allergy status to sulfonamides status: Secondary | ICD-10-CM

## 2016-06-06 DIAGNOSIS — E876 Hypokalemia: Secondary | ICD-10-CM | POA: Diagnosis present

## 2016-06-06 DIAGNOSIS — I4821 Permanent atrial fibrillation: Secondary | ICD-10-CM | POA: Diagnosis present

## 2016-06-06 DIAGNOSIS — Z7901 Long term (current) use of anticoagulants: Secondary | ICD-10-CM

## 2016-06-06 DIAGNOSIS — D649 Anemia, unspecified: Secondary | ICD-10-CM | POA: Diagnosis present

## 2016-06-06 DIAGNOSIS — D122 Benign neoplasm of ascending colon: Secondary | ICD-10-CM | POA: Diagnosis present

## 2016-06-06 DIAGNOSIS — Z823 Family history of stroke: Secondary | ICD-10-CM

## 2016-06-06 DIAGNOSIS — Z96642 Presence of left artificial hip joint: Secondary | ICD-10-CM | POA: Diagnosis present

## 2016-06-06 DIAGNOSIS — I5032 Chronic diastolic (congestive) heart failure: Secondary | ICD-10-CM | POA: Diagnosis not present

## 2016-06-06 DIAGNOSIS — D123 Benign neoplasm of transverse colon: Secondary | ICD-10-CM | POA: Diagnosis present

## 2016-06-06 DIAGNOSIS — Z79899 Other long term (current) drug therapy: Secondary | ICD-10-CM

## 2016-06-06 DIAGNOSIS — K626 Ulcer of anus and rectum: Secondary | ICD-10-CM | POA: Diagnosis present

## 2016-06-06 DIAGNOSIS — K5730 Diverticulosis of large intestine without perforation or abscess without bleeding: Secondary | ICD-10-CM | POA: Diagnosis present

## 2016-06-06 DIAGNOSIS — E785 Hyperlipidemia, unspecified: Secondary | ICD-10-CM | POA: Diagnosis present

## 2016-06-06 DIAGNOSIS — I5033 Acute on chronic diastolic (congestive) heart failure: Secondary | ICD-10-CM | POA: Diagnosis present

## 2016-06-06 DIAGNOSIS — Z86010 Personal history of colonic polyps: Secondary | ICD-10-CM

## 2016-06-06 DIAGNOSIS — K635 Polyp of colon: Secondary | ICD-10-CM | POA: Insufficient documentation

## 2016-06-06 DIAGNOSIS — I10 Essential (primary) hypertension: Secondary | ICD-10-CM | POA: Diagnosis present

## 2016-06-06 LAB — URINE MICROSCOPIC-ADD ON

## 2016-06-06 LAB — URINALYSIS, ROUTINE W REFLEX MICROSCOPIC
Bilirubin Urine: NEGATIVE
Glucose, UA: NEGATIVE mg/dL
Ketones, ur: NEGATIVE mg/dL
Nitrite: NEGATIVE
Protein, ur: NEGATIVE mg/dL
Specific Gravity, Urine: 1.015 (ref 1.005–1.030)
pH: 5.5 (ref 5.0–8.0)

## 2016-06-06 LAB — CBC WITH DIFFERENTIAL/PLATELET
Basophils Absolute: 0 10*3/uL (ref 0.0–0.1)
Basophils Relative: 0 %
Eosinophils Absolute: 0.1 10*3/uL (ref 0.0–0.7)
Eosinophils Relative: 1 %
HCT: 39.9 % (ref 36.0–46.0)
Hemoglobin: 13.1 g/dL (ref 12.0–15.0)
Lymphocytes Relative: 12 %
Lymphs Abs: 1.3 10*3/uL (ref 0.7–4.0)
MCH: 29.2 pg (ref 26.0–34.0)
MCHC: 32.8 g/dL (ref 30.0–36.0)
MCV: 89.1 fL (ref 78.0–100.0)
Monocytes Absolute: 0.8 10*3/uL (ref 0.1–1.0)
Monocytes Relative: 7 %
Neutro Abs: 9 10*3/uL — ABNORMAL HIGH (ref 1.7–7.7)
Neutrophils Relative %: 80 %
Platelets: ADEQUATE 10*3/uL (ref 150–400)
RBC: 4.48 MIL/uL (ref 3.87–5.11)
RDW: 14 % (ref 11.5–15.5)
WBC: 11.2 10*3/uL — ABNORMAL HIGH (ref 4.0–10.5)

## 2016-06-06 LAB — COMPREHENSIVE METABOLIC PANEL
ALT: 23 U/L (ref 14–54)
AST: 27 U/L (ref 15–41)
Albumin: 3.6 g/dL (ref 3.5–5.0)
Alkaline Phosphatase: 65 U/L (ref 38–126)
Anion gap: 8 (ref 5–15)
BUN: 16 mg/dL (ref 6–20)
CO2: 26 mmol/L (ref 22–32)
Calcium: 8.3 mg/dL — ABNORMAL LOW (ref 8.9–10.3)
Chloride: 99 mmol/L — ABNORMAL LOW (ref 101–111)
Creatinine, Ser: 0.92 mg/dL (ref 0.44–1.00)
GFR calc Af Amer: >60 mL/min (ref >60)
GFR calc non Af Amer: 56 mL/min — ABNORMAL LOW (ref >60)
Glucose, Bld: 112 mg/dL — ABNORMAL HIGH (ref 65–99)
Potassium: 3.4 mmol/L — ABNORMAL LOW (ref 3.5–5.1)
Sodium: 133 mmol/L — ABNORMAL LOW (ref 135–145)
Total Bilirubin: 1.5 mg/dL — ABNORMAL HIGH (ref 0.3–1.2)
Total Protein: 6.7 g/dL (ref 6.5–8.1)

## 2016-06-06 LAB — PLATELET COUNT: Platelets: 193 10*3/uL (ref 150–400)

## 2016-06-06 LAB — AMMONIA: Ammonia: 20 umol/L (ref 9–35)

## 2016-06-06 LAB — MAGNESIUM: Magnesium: 1.8 mg/dL (ref 1.7–2.4)

## 2016-06-06 LAB — PROCALCITONIN: Procalcitonin: 0.66 ng/mL

## 2016-06-06 MED ORDER — SODIUM CHLORIDE 0.9 % IV SOLN
Freq: Once | INTRAVENOUS | Status: AC
  Administered 2016-06-06: 20:00:00 via INTRAVENOUS

## 2016-06-06 MED ORDER — IOPAMIDOL (ISOVUE-300) INJECTION 61%
100.0000 mL | Freq: Once | INTRAVENOUS | Status: AC | PRN
  Administered 2016-06-06: 100 mL via INTRAVENOUS

## 2016-06-06 MED ORDER — CALCIUM CARBONATE-VITAMIN D 500-200 MG-UNIT PO TABS
1.0000 | ORAL_TABLET | Freq: Two times a day (BID) | ORAL | Status: DC
  Administered 2016-06-06 – 2016-06-12 (×12): 1 via ORAL
  Filled 2016-06-06 (×13): qty 1

## 2016-06-06 MED ORDER — GLUCOSAMINE CHONDR 1500 COMPLX PO CAPS: 1.0000 | ORAL_CAPSULE | Freq: Every day | ORAL | Status: DC

## 2016-06-06 MED ORDER — APIXABAN 5 MG PO TABS
5.0000 mg | ORAL_TABLET | Freq: Two times a day (BID) | ORAL | Status: DC
  Administered 2016-06-06 – 2016-06-07 (×3): 5 mg via ORAL
  Filled 2016-06-06 (×5): qty 1

## 2016-06-06 MED ORDER — POTASSIUM CHLORIDE IN NACL 20-0.9 MEQ/L-% IV SOLN
INTRAVENOUS | Status: DC
  Administered 2016-06-06: 22:00:00 via INTRAVENOUS
  Filled 2016-06-06: qty 1000

## 2016-06-06 MED ORDER — ONDANSETRON HCL 4 MG/2ML IJ SOLN
4.0000 mg | Freq: Four times a day (QID) | INTRAMUSCULAR | Status: DC | PRN
  Administered 2016-06-07: 4 mg via INTRAVENOUS
  Filled 2016-06-06: qty 2

## 2016-06-06 MED ORDER — SIMVASTATIN 10 MG PO TABS
10.0000 mg | ORAL_TABLET | Freq: Every day | ORAL | Status: DC
  Administered 2016-06-06 – 2016-06-12 (×7): 10 mg via ORAL
  Filled 2016-06-06 (×7): qty 1

## 2016-06-06 MED ORDER — METRONIDAZOLE 500 MG PO TABS
500.0000 mg | ORAL_TABLET | Freq: Once | ORAL | Status: AC
  Administered 2016-06-06: 500 mg via ORAL
  Filled 2016-06-06: qty 1

## 2016-06-06 MED ORDER — CIPROFLOXACIN HCL 500 MG PO TABS
500.0000 mg | ORAL_TABLET | Freq: Once | ORAL | Status: AC
  Administered 2016-06-06: 500 mg via ORAL
  Filled 2016-06-06: qty 1

## 2016-06-06 MED ORDER — CIPROFLOXACIN IN D5W 400 MG/200ML IV SOLN
400.0000 mg | Freq: Two times a day (BID) | INTRAVENOUS | Status: DC
  Administered 2016-06-07 – 2016-06-08 (×3): 400 mg via INTRAVENOUS
  Filled 2016-06-06 (×3): qty 200

## 2016-06-06 MED ORDER — SODIUM CHLORIDE 0.9 % IV BOLUS (SEPSIS)
1000.0000 mL | Freq: Once | INTRAVENOUS | Status: AC
  Administered 2016-06-06: 1000 mL via INTRAVENOUS

## 2016-06-06 MED ORDER — ONDANSETRON HCL 4 MG PO TABS: 4.0000 mg | ORAL_TABLET | Freq: Four times a day (QID) | ORAL | Status: DC | PRN

## 2016-06-06 MED ORDER — METRONIDAZOLE IN NACL 5-0.79 MG/ML-% IV SOLN
500.0000 mg | Freq: Three times a day (TID) | INTRAVENOUS | Status: DC
  Administered 2016-06-07 – 2016-06-08 (×4): 500 mg via INTRAVENOUS
  Filled 2016-06-06 (×5): qty 100

## 2016-06-06 MED ORDER — ACETAMINOPHEN 650 MG RE SUPP: 650.0000 mg | Freq: Four times a day (QID) | RECTAL | Status: DC | PRN

## 2016-06-06 MED ORDER — HYDROCODONE-ACETAMINOPHEN 5-325 MG PO TABS: 1.0000 | ORAL_TABLET | ORAL | Status: DC | PRN

## 2016-06-06 MED ORDER — ACETAMINOPHEN 325 MG PO TABS: 650.0000 mg | ORAL_TABLET | Freq: Four times a day (QID) | ORAL | Status: DC | PRN

## 2016-06-06 MED ORDER — ATENOLOL 25 MG PO TABS
25.0000 mg | ORAL_TABLET | Freq: Two times a day (BID) | ORAL | Status: DC
  Administered 2016-06-06 – 2016-06-09 (×6): 25 mg via ORAL
  Filled 2016-06-06 (×7): qty 1

## 2016-06-06 NOTE — ED Provider Notes (Signed)
CSN: [id number]     Arrival date & time 06/06/16  1505 History   First MD Initiated Contact with Patient 06/06/16 1702     Chief Complaint  Patient presents with  . Diarrhea  . Medication Reaction   PT WAS HERE ON 6/5 WITH DIARRHEA AND BLOOD IN STOOLS.  THE PT WAS REFERRED TO [facility] WHOM SHE SAW ON 6/7.  THE PT WAS PLACED ON FLAGYL FOR POSSIBLE COLITIS.  SHE SAID THAT SHE IS STILL HAVING DIARRHEA AND FEELS CONFUSED.  SHE IS NOT SURE IF IT IS FROM THE FLAGYL OR NOT.  (Consider location/radiation/quality/duration/timing/severity/associated sxs/prior Treatment) Patient is a 82 y.o. female presenting with diarrhea. The history is provided by the patient.  Diarrhea Quality:  Bloody Severity:  Mild Onset quality:  Gradual Timing:  Intermittent Progression:  Unchanged   Past Medical History  Diagnosis Date  . Anemia   . Arrhythmia   . Colon polyps   . Congestive heart failure ([place])   . Hyperlipidemia   . Hypertension   . UTI (lower urinary tract infection)   . Community acquired pneumonia Feb 2013    LLL  . Pleural effusion, right Feb 2013  . Osteoporosis   . SVT (supraventricular tachycardia) ([place]) feb 2013  . Cataract    Past Surgical History  Procedure Laterality Date  . Abdominal hysterectomy  1983  . Appendectomy  1983  . Colonoscopy    . Upper gastrointestinal endoscopy    . Hemiarthroplasty hip  2011    Left femoral neck fracture   Family History  Problem Relation Age of Onset  . Colon polyps Son   . Stroke Father    Social History  Substance Use Topics  . Smoking status: Never Smoker   . Smokeless tobacco: Never Used  . Alcohol Use: No   OB History    No data available     Review of Systems  Gastrointestinal: Positive for diarrhea.  All other systems reviewed and are negative.     Allergies  Sulfa antibiotics  Home Medications   Prior to Admission medications   Medication Sig Start Date End Date Taking? Authorizing Provider  apixaban  (ELIQUIS) 5 MG TABS tablet Take 1 tablet (5 mg total) by mouth 2 (two) times daily. 06/08/15  Yes [name], MD  atenolol (TENORMIN) 25 MG tablet Take 1 tablet (25 mg total) by mouth 2 (two) times daily. 11/02/15  Yes [name], MD  calcium-vitamin D (OSCAL WITH D) 500-200 MG-UNIT tablet Take 1 tablet by mouth 2 (two) times daily.   Yes Historical Provider, MD  estradiol (ESTRACE) 1 MG tablet Take 0.5 mg by mouth every other day.  07/24/15  Yes Historical Provider, MD  furosemide (LASIX) 20 MG tablet Take 1 tablet (20 mg total) by mouth daily. . 11/02/15  Yes [name], MD  Glucosamine-Chondroit-Vit C-Mn (GLUCOSAMINE CHONDR 1500 COMPLX PO) Take 1 tablet by mouth 2 (two) times daily.   Yes Historical Provider, MD  loperamide (IMODIUM A-D) 2 MG tablet Take 2 mg by mouth 4 (four) times daily as needed for diarrhea or loose stools. Reported on 06/04/2016   Yes Historical Provider, MD  Melatonin 5 MG CAPS Take 1 capsule by mouth at bedtime.    Yes Historical Provider, MD  metroNIDAZOLE (FLAGYL) 500 MG tablet Take 1 tablet (500 mg total) by mouth 2 (two) times daily. 06/04/16  Yes Jessica D Zehr, PA-C  potassium chloride (K-DUR) 10 MEQ tablet Take 1 tablet (10 mEq  total) by mouth daily. 11/02/15  Yes [name], MD  simvastatin (ZOCOR) 20 MG tablet Take 10 mg by mouth daily.    Yes Historical Provider, MD   BP 121/70 mmHg  Pulse 96  Temp(Src) 97.9 F (36.6 C) (Oral)  Resp 16  SpO2 97% Physical Exam  Constitutional: She is oriented to person, place, and time. She appears well-developed and well-nourished.  HENT:  Head: Normocephalic and atraumatic.  Right Ear: External ear normal.  Left Ear: External ear normal.  Nose: Nose normal.  Mouth/Throat: Oropharynx is clear and moist.  Eyes: Conjunctivae and EOM are normal. Pupils are equal, round, and reactive to light.  Neck: Normal range of motion. Neck supple.  Cardiovascular: Normal rate, regular rhythm, normal heart sounds and intact distal  pulses.   Pulmonary/Chest: Effort normal and breath sounds normal.  Abdominal: Soft. Bowel sounds are normal.  Musculoskeletal: Normal range of motion.  Neurological: She is alert and oriented to person, place, and time.  Skin: Skin is warm and dry.  Psychiatric: She has a normal mood and affect. Her behavior is normal. Judgment and thought content normal.  Nursing note and vitals reviewed.   ED Course  Procedures (including critical care time) Labs Review Labs Reviewed  COMPREHENSIVE METABOLIC PANEL - Abnormal; Notable for the following:    Sodium 133 (*)    Potassium 3.4 (*)    Chloride 99 (*)    Glucose, Bld 112 (*)    Calcium 8.3 (*)    Total Bilirubin 1.5 (*)    GFR calc non Af Amer 56 (*)    All other components within normal limits  CBC WITH DIFFERENTIAL/PLATELET - Abnormal; Notable for the following:    WBC 11.2 (*)    Neutro Abs 9.0 (*)    All other components within normal limits  URINALYSIS, ROUTINE W REFLEX MICROSCOPIC (NOT AT [facility]) - Abnormal; Notable for the following:    APPearance CLOUDY (*)    Hgb urine dipstick MODERATE (*)    Leukocytes, UA LARGE (*)    All other components within normal limits  URINE MICROSCOPIC-ADD ON - Abnormal; Notable for the following:    Squamous Epithelial / LPF 6-30 (*)    Bacteria, UA FEW (*)    Casts HYALINE CASTS (*)    All other components within normal limits  AMMONIA  PLATELET COUNT  MAGNESIUM    Imaging Review Ct Abdomen Pelvis W Contrast  06/06/2016  CLINICAL DATA:  82 year old female with abdominal pain and diarrhea. EXAM: CT ABDOMEN AND PELVIS WITH CONTRAST TECHNIQUE: Multidetector CT imaging of the abdomen and pelvis was performed using the standard protocol following bolus administration of intravenous contrast. CONTRAST:  119m ISOVUE-300 IOPAMIDOL (ISOVUE-300) INJECTION 61% COMPARISON:  CT dated 03/22/2015 FINDINGS: The visualized lung bases are clear. There is dilatation of the right cardiac chambers primarily  involving the right atrium. Correlation with echocardiogram recommended. No intra-abdominal free air or free fluid identified There is mottled pattern of contrast enhancement of the liver In the arterial phase compatible with nutmeg liver and secondary to hepatic venous congestion possibly as a result of right cardiac dysfunction. The gallbladder pancreas, spleen, adrenal glands, kidneys, visualized ureters, and urinary bladder appear unremarkable. Hysterectomy. Evaluation of the pelvic structures is limited due to streak artifact caused by left hip arthroplasty. There is thickened appearance of the distal colon and rectosigmoid most compatible with colitis. Correlation with clinical exam and stool cultures recommended. There is no evidence of bowel obstruction. Appendectomy. There is mild  aortoiliac atherosclerotic disease. The origins of the celiac axis, SMA, and IMA appear patent. No portal venous gas identified. The SMV, splenic vein, and main portal veins are patent. There is no adenopathy. The abdominal wall soft tissues appear unremarkable. There is degenerative changes of the spine. Left hip arthroplasty. No acute fracture. IMPRESSION: Findings most compatible with colitis. Correlation with clinical exam and stool cultures recommended. No bowel obstruction. Mottled enhancement pattern of the liver compatible with hepatic venous congestion likely secondary to right cardiac dysfunction. Electronically Signed   By: Anner Crete M.D.   On: 06/06/2016 18:50   I have personally reviewed and evaluated these images and lab results as part of my medical decision-making.   EKG Interpretation None      MDM  THIS IS [name] 2ND VISIT TO THE ED THIS WEEK.  SHE HAS FAILED OUTPATIENT TREATMENT.  I SPOKE WITH DR. OPYD ([facility]) WHO WILL ADMIT FOR OBS. Final diagnoses:  Colitis  Failure of outpatient treatment       Isla Pence, MD 06/06/16 1940

## 2016-06-06 NOTE — H&P (Signed)
History and Physical    [name] Dyer ZOX:[id number] DOB: 31-Jul-1932 DOA: 06/06/2016  PCP: [name], MD   Patient coming from: Home   Chief Complaint: Diarrhea, confusion  HPI: [name] Dyer is a 82 y.o. female with medical history significant for paroxysmal atrial fibrillation on liquids, hypertension, hyperlipidemia, and chronic diastolic CHF who presents to the ED with 2 weeks of diarrhea and now confusion. Patient reports being in her usual state of health until approximately 2 days ago when she developed diarrhea with approximately 6 loose stools daily. She reports that on a couple of occasions, she has noted a small amount of blood on the toilet paper. She denies any fevers associated with this illness, but endorses some mild to moderate, intermittent, nonradiating pain across the lower abdomen. There is been no vomiting, recent long distance travel, or sick contacts. She was evaluated in the gastroenterology office 2 days ago and prescribed an empiric course of Flagyl. Unfortunately, there was no improvement in her diarrhea with this, and the patient has since developed confusion. She reports waking this morning uncertain where she was and describes feelings of dissociation. This was reported to her physician's office and she was directed to the emergency department for further evaluation.  ED Course: Upon arrival to the ED, patient is found to be afebrile, saturating well on room air, and with vital signs stable. Chemistry panel features a mild hyponatremia and hypokalemia, as well as elevation in total bilirubin 1.5. CBC is notable for a leukocytosis of 11,200 with toxic granulation and clumped platelets. CT of the abdomen and pelvis was obtained and findings are consistent with colitis. Also noted on CT is a mottled appearance to the liver suggestive of congestive hepatopathy. These liver findings were not present on CT abdomen from March 2016 and bilirubin had not been elevated  previously either. A 1 L bolus of normal saline was administered in the emergency department and empiric treatment with ciprofloxacin and Flagyl was given. Patient remained hemodynamically stable in the emergency department and will be admitted to the hospital for ongoing evaluation and management of colitis, worsening despite outpatient treatment and with the new development of confusion.  Review of Systems:  All other systems reviewed and apart from HPI, are negative.  Past Medical History  Diagnosis Date  . Anemia   . Arrhythmia   . Colon polyps   . Congestive heart failure ([place])   . Hyperlipidemia   . Hypertension   . UTI (lower urinary tract infection)   . Community acquired pneumonia Feb 2013    LLL  . Pleural effusion, right Feb 2013  . Osteoporosis   . SVT (supraventricular tachycardia[place] ([place] feb 2013  . Cataract     Past Surgical History  Procedure Laterality Date  . Abdominal hysterectomy  1983  . Appendectomy  1983  . Colonoscopy    . Upper gastrointestinal endoscopy    . Hemiarthroplasty hip  2011    Left femoral neck fracture     reports that she has never smoked. She has never used smokeless tobacco. She reports that she does not drink alcohol or use illicit drugs.  Allergies  Allergen Reactions  . Sulfa Antibiotics Hives    Severe Rash    Family History  Problem Relation Age of Onset  . Colon polyps Son   . Stroke Father      Prior to Admission medications   Medication Sig Start Date End Date Taking? Authorizing Provider  apixaban (ELIQUIS) 5 MG TABS  tablet Take 1 tablet (5 mg total) by mouth 2 (two) times daily. 06/08/15  Yes [name], MD  atenolol (TENORMIN) 25 MG tablet Take 1 tablet (25 mg total) by mouth 2 (two) times daily. 11/02/15  Yes [name], MD  calcium-vitamin D (OSCAL WITH D) 500-200 MG-UNIT tablet Take 1 tablet by mouth 2 (two) times daily.   Yes Historical Provider, MD  estradiol (ESTRACE) 1 MG tablet Take 0.5 mg by mouth every  other day.  07/24/15  Yes Historical Provider, MD  furosemide (LASIX) 20 MG tablet Take 1 tablet (20 mg total) by mouth daily. . 11/02/15  Yes [name], MD  Glucosamine-Chondroit-Vit C-Mn (GLUCOSAMINE CHONDR 1500 COMPLX PO) Take 1 tablet by mouth 2 (two) times daily.   Yes Historical Provider, MD  loperamide (IMODIUM A-D) 2 MG tablet Take 2 mg by mouth 4 (four) times daily as needed for diarrhea or loose stools. Reported on 06/04/2016   Yes Historical Provider, MD  Melatonin 5 MG CAPS Take 1 capsule by mouth at bedtime.    Yes Historical Provider, MD  metroNIDAZOLE (FLAGYL) 500 MG tablet Take 1 tablet (500 mg total) by mouth 2 (two) times daily. 06/04/16  Yes Jessica D Zehr, PA-C  potassium chloride (K-DUR) 10 MEQ tablet Take 1 tablet (10 mEq total) by mouth daily. 11/02/15  Yes [name], MD  simvastatin (ZOCOR) 20 MG tablet Take 10 mg by mouth daily.    Yes Historical Provider, MD    Physical Exam: Filed Vitals:   06/06/16 1514 06/06/16 1723  BP: 120/71 121/70  Pulse: 92 96  Temp: 98.6 F (37 C) 97.9 F (36.6 C)  TempSrc: Oral Oral  Resp: 18 16  SpO2: 100% 97%      Constitutional: NAD, calm, comfortable Eyes: PERTLA, lids and conjunctivae normal ENMT: Mucous membranes are moist. Posterior pharynx clear of any exudate or lesions.   Neck: normal, supple, no masses, no thyromegaly Respiratory: clear to auscultation bilaterally, no wheezing, no crackles. Normal respiratory effort.    Cardiovascular: S1 & S2 heard. 2+ pedal pulses. No carotid bruits. No significant JVD. Abdomen: No distension, mild tenderness across lower quadrants, no rebound pain or guarding, no masses palpated. Bowel sounds active.  Musculoskeletal: no clubbing / cyanosis. No joint deformity upper and lower extremities. Normal muscle tone.  Skin: no significant rashes, lesions, ulcers. Warm, dry, well-perfused. Neurologic: CN 2-12 grossly intact. Sensation intact, DTR normal. Strength 5/5 in all 4 limbs.    Psychiatric: Normal judgment and insight. Alert and oriented x 3. Normal mood and affect.     Labs on Admission: I have personally reviewed following labs and imaging studies  CBC:  Recent Labs Lab 06/02/16 1956 06/06/16 1716  WBC 8.6 11.2*  NEUTROABS  --  9.0*  HGB 12.9 13.1  HCT 38.7 39.9  MCV 89.4 89.1  PLT PLATELET CLUMPS NOTED ON SMEAR, UNABLE TO ESTIMATE PLATELET CLUMPS NOTED ON SMEAR, COUNT APPEARS ADEQUATE   Basic Metabolic Panel:  Recent Labs Lab 06/02/16 1956 06/06/16 1716 06/06/16 2017  NA 136 133*  --   K 4.1 3.4*  --   CL 106 99*  --   CO2 24 26  --   GLUCOSE 102* 112*  --   BUN 13 16  --   CREATININE 0.94 0.92  --   CALCIUM 8.7* 8.3*  --   MG  --   --  1.8   GFR: Estimated Creatinine Clearance: 38 mL/min (by C-G formula based on  Cr of 0.92). Liver Function Tests:  Recent Labs Lab 06/02/16 1956 06/06/16 1716  AST 25 27  ALT 23 23  ALKPHOS 69 65  BILITOT 1.2 1.5*  PROT 6.8 6.7  ALBUMIN 3.8 3.6   No results for input(s): LIPASE, AMYLASE in the last 168 hours. No results for input(s): AMMONIA in the last 168 hours. Coagulation Profile: No results for input(s): INR, PROTIME in the last 168 hours. Cardiac Enzymes: No results for input(s): CKTOTAL, CKMB, CKMBINDEX, TROPONINI in the last 168 hours. BNP (last 3 results)  Recent Labs  08/27/15 1021  PROBNP 318.0*   HbA1C: No results for input(s): HGBA1C in the last 72 hours. CBG: No results for input(s): GLUCAP in the last 168 hours. Lipid Profile: No results for input(s): CHOL, HDL, LDLCALC, TRIG, CHOLHDL, LDLDIRECT in the last 72 hours. Thyroid Function Tests: No results for input(s): TSH, T4TOTAL, FREET4, T3FREE, THYROIDAB in the last 72 hours. Anemia Panel: No results for input(s): VITAMINB12, FOLATE, FERRITIN, TIBC, IRON, RETICCTPCT in the last 72 hours. Urine analysis:    Component Value Date/Time   COLORURINE YELLOW 06/06/2016 1810   APPEARANCEUR CLOUDY* 06/06/2016 1810    LABSPEC 1.015 06/06/2016 1810   PHURINE 5.5 06/06/2016 1810   GLUCOSEU NEGATIVE 06/06/2016 1810   HGBUR MODERATE* 06/06/2016 1810   BILIRUBINUR NEGATIVE 06/06/2016 1810   KETONESUR NEGATIVE 06/06/2016 1810   PROTEINUR NEGATIVE 06/06/2016 1810   UROBILINOGEN 0.2 03/22/2015 1302   NITRITE NEGATIVE 06/06/2016 1810   LEUKOCYTESUR LARGE* 06/06/2016 1810   Sepsis Labs: _0 (procalcitonin:4,lacticidven:4) )No results found for this or any previous visit (from the past 240 hour(s)).   Radiological Exams on Admission: Ct Abdomen Pelvis W Contrast  06/06/2016  CLINICAL DATA:  82 year old female with abdominal pain and diarrhea. EXAM: CT ABDOMEN AND PELVIS WITH CONTRAST TECHNIQUE: Multidetector CT imaging of the abdomen and pelvis was performed using the standard protocol following bolus administration of intravenous contrast. CONTRAST:  183m ISOVUE-300 IOPAMIDOL (ISOVUE-300) INJECTION 61% COMPARISON:  CT dated 03/22/2015 FINDINGS: The visualized lung bases are clear. There is dilatation of the right cardiac chambers primarily involving the right atrium. Correlation with echocardiogram recommended. No intra-abdominal free air or free fluid identified There is mottled pattern of contrast enhancement of the liver In the arterial phase compatible with nutmeg liver and secondary to hepatic venous congestion possibly as a result of right cardiac dysfunction. The gallbladder pancreas, spleen, adrenal glands, kidneys, visualized ureters, and urinary bladder appear unremarkable. Hysterectomy. Evaluation of the pelvic structures is limited due to streak artifact caused by left hip arthroplasty. There is thickened appearance of the distal colon and rectosigmoid most compatible with colitis. Correlation with clinical exam and stool cultures recommended. There is no evidence of bowel obstruction. Appendectomy. There is mild aortoiliac atherosclerotic disease. The origins of the celiac axis, SMA, and IMA appear  patent. No portal venous gas identified. The SMV, splenic vein, and main portal veins are patent. There is no adenopathy. The abdominal wall soft tissues appear unremarkable. There is degenerative changes of the spine. Left hip arthroplasty. No acute fracture. IMPRESSION: Findings most compatible with colitis. Correlation with clinical exam and stool cultures recommended. No bowel obstruction. Mottled enhancement pattern of the liver compatible with hepatic venous congestion likely secondary to right cardiac dysfunction. Electronically Signed   By: AAnner CreteM.D.   On: 06/06/2016 18:50    EKG: Not performed, will obtain as appropriate  Assessment/Plan  1. Colitis, presumed infectious  - CT findings suggest colitis, presumed infectious with leukocytosis and  reported fever at home  - GI pathogen panel pending  - On empiric Cipro and Flagyl for now  - Infection-control measures with enteric precautions   2. Acute encephalopathy  - Resolved by time of admission  - Likely toxic-metabolic in setting of acute infection   3. Chronic diastolic CHF  - No suggestion of acute decompensation  - TTE (03/20/15) with EF 76-72%, diastolic dysfunction (not quantified), mild-mod LAE, mild AR, mod elevation in pulmonary pressures  - Managed with Lasix at home, currently holding given poor PO intake and mild dehydration on admission  - Follow I/Os, daily wts - Resume diuretic as appropriate  - Update echocardiogram as below   4. Congestive hepatopathy  - Incidental notation made of hepatic venous congestion on CT, not seen on abd CT from March 2016  - Bilirubin is mildly elevated, 1.5, which appears to be new  - Concerning for interval development of right heart failure  - Plan to further evaluate with TTE    5. Paroxysmal atrial fibrillation  - In atrial fibrillation with good rate-control on admission  - CHADS-VASc is 21 (age x2, gender, HTN, CHF)  - Anticoagulated with Eliquis, will continue  -  Continue atenolol    6. Hypertension - Managed with atenolol and Lasix at home  - Holding Lasix as above while hydrating - Continue atenolol   7. Hyperlipidemia  - Managed with Zocor, will continue    8. Hyponatremia, hypokalemia  - Mild; serum sodium 133 and potassium 3.5 at time of admission  - Replacing with NS+K  - Mag level wnl  - Repeat chem panel in am    DVT prophylaxis: On Eliquis  Code Status: Full  Family Communication: Updated children at bedside   Disposition Plan: Observe on med/surg  Consults called: None   Admission status: Observation     [name], MD [facility] Pager [phone]  If 7PM-7AM, please contact night-coverage [email or web address] Password [facility]  06/06/2016, 8:56 PM

## 2016-06-06 NOTE — ED Notes (Signed)
Pt complains of confusion since taking flagyl on Tuesday. Pt states "my house doesn't feel like my house". Pt was seen at [facility] for diarrhea on Tuesday and prescribed flagyl. Pt states her diarrhea has not resolved and still has 5-6 bowel movements a day.

## 2016-06-06 NOTE — Telephone Encounter (Signed)
Patient arrived in waiting room asking for nurse. She reports she brought her stool studies back today. She states she got up during the night and "my refrigerator was on the wrong side of the kitchen." States she was not sure where she was when she woke up either. She is still having stools and unable to eat. She thinks the Flagyl is causing her symptoms. Patient states she did have vomiting x 1 today. She is aware of self, time and place. Patient instructed to go to ED or urgent care for evaluation.

## 2016-06-06 NOTE — Progress Notes (Signed)
PHARMACIST - PHYSICIAN ORDER COMMUNICATION  CONCERNING: P&T Medication Policy on Herbal Medications  DESCRIPTION:  This patient's order for:  Glucosamine  has been noted.  This product(s) is classified as an "herbal" or natural product. Due to a lack of definitive safety studies or FDA approval, nonstandard manufacturing practices, plus the potential risk of unknown drug-drug interactions while on inpatient medications, the Pharmacy and Therapeutics Committee does not permit the use of "herbal" or natural products of this type within [facility].   ACTION TAKEN: The pharmacy department is unable to verify this order at this time and your patient has been informed of this safety policy. Please reevaluate patient's clinical condition at discharge and address if the herbal or natural product(s) should be resumed at that time.  Dia Sitter, PharmD, BCPS 06/06/2016 9:35 PM

## 2016-06-07 ENCOUNTER — Observation Stay (HOSPITAL_BASED_OUTPATIENT_CLINIC_OR_DEPARTMENT_OTHER): Payer: Medicare Other

## 2016-06-07 DIAGNOSIS — A09 Infectious gastroenteritis and colitis, unspecified: Secondary | ICD-10-CM | POA: Diagnosis not present

## 2016-06-07 DIAGNOSIS — R0600 Dyspnea, unspecified: Secondary | ICD-10-CM

## 2016-06-07 DIAGNOSIS — Z79899 Other long term (current) drug therapy: Secondary | ICD-10-CM | POA: Diagnosis not present

## 2016-06-07 DIAGNOSIS — Z90710 Acquired absence of both cervix and uterus: Secondary | ICD-10-CM | POA: Diagnosis not present

## 2016-06-07 DIAGNOSIS — K5190 Ulcerative colitis, unspecified, without complications: Secondary | ICD-10-CM | POA: Diagnosis not present

## 2016-06-07 DIAGNOSIS — G9340 Encephalopathy, unspecified: Secondary | ICD-10-CM | POA: Diagnosis not present

## 2016-06-07 DIAGNOSIS — Z96642 Presence of left artificial hip joint: Secondary | ICD-10-CM | POA: Diagnosis present

## 2016-06-07 DIAGNOSIS — Z86010 Personal history of colonic polyps: Secondary | ICD-10-CM | POA: Diagnosis not present

## 2016-06-07 DIAGNOSIS — K626 Ulcer of anus and rectum: Secondary | ICD-10-CM | POA: Diagnosis present

## 2016-06-07 DIAGNOSIS — I110 Hypertensive heart disease with heart failure: Secondary | ICD-10-CM | POA: Diagnosis present

## 2016-06-07 DIAGNOSIS — Z882 Allergy status to sulfonamides status: Secondary | ICD-10-CM | POA: Diagnosis not present

## 2016-06-07 DIAGNOSIS — Z823 Family history of stroke: Secondary | ICD-10-CM | POA: Diagnosis not present

## 2016-06-07 DIAGNOSIS — D649 Anemia, unspecified: Secondary | ICD-10-CM | POA: Diagnosis present

## 2016-06-07 DIAGNOSIS — I272 Other secondary pulmonary hypertension: Secondary | ICD-10-CM | POA: Diagnosis not present

## 2016-06-07 DIAGNOSIS — I10 Essential (primary) hypertension: Secondary | ICD-10-CM | POA: Diagnosis not present

## 2016-06-07 DIAGNOSIS — K5730 Diverticulosis of large intestine without perforation or abscess without bleeding: Secondary | ICD-10-CM | POA: Diagnosis present

## 2016-06-07 DIAGNOSIS — D122 Benign neoplasm of ascending colon: Secondary | ICD-10-CM | POA: Diagnosis present

## 2016-06-07 DIAGNOSIS — M810 Age-related osteoporosis without current pathological fracture: Secondary | ICD-10-CM | POA: Diagnosis present

## 2016-06-07 DIAGNOSIS — E860 Dehydration: Secondary | ICD-10-CM | POA: Diagnosis present

## 2016-06-07 DIAGNOSIS — Z7901 Long term (current) use of anticoagulants: Secondary | ICD-10-CM | POA: Diagnosis not present

## 2016-06-07 DIAGNOSIS — G9341 Metabolic encephalopathy: Secondary | ICD-10-CM | POA: Diagnosis not present

## 2016-06-07 DIAGNOSIS — A047 Enterocolitis due to Clostridium difficile: Secondary | ICD-10-CM | POA: Diagnosis not present

## 2016-06-07 DIAGNOSIS — K635 Polyp of colon: Secondary | ICD-10-CM | POA: Diagnosis not present

## 2016-06-07 DIAGNOSIS — K644 Residual hemorrhoidal skin tags: Secondary | ICD-10-CM | POA: Diagnosis present

## 2016-06-07 DIAGNOSIS — Z8371 Family history of colonic polyps: Secondary | ICD-10-CM | POA: Diagnosis not present

## 2016-06-07 DIAGNOSIS — K529 Noninfective gastroenteritis and colitis, unspecified: Secondary | ICD-10-CM

## 2016-06-07 DIAGNOSIS — I480 Paroxysmal atrial fibrillation: Secondary | ICD-10-CM | POA: Diagnosis not present

## 2016-06-07 DIAGNOSIS — K648 Other hemorrhoids: Secondary | ICD-10-CM | POA: Diagnosis present

## 2016-06-07 DIAGNOSIS — K921 Melena: Secondary | ICD-10-CM | POA: Diagnosis not present

## 2016-06-07 DIAGNOSIS — K761 Chronic passive congestion of liver: Secondary | ICD-10-CM | POA: Diagnosis present

## 2016-06-07 DIAGNOSIS — E871 Hypo-osmolality and hyponatremia: Secondary | ICD-10-CM | POA: Diagnosis present

## 2016-06-07 DIAGNOSIS — D696 Thrombocytopenia, unspecified: Secondary | ICD-10-CM | POA: Diagnosis not present

## 2016-06-07 DIAGNOSIS — D123 Benign neoplasm of transverse colon: Secondary | ICD-10-CM | POA: Diagnosis present

## 2016-06-07 DIAGNOSIS — E876 Hypokalemia: Secondary | ICD-10-CM | POA: Diagnosis present

## 2016-06-07 DIAGNOSIS — E785 Hyperlipidemia, unspecified: Secondary | ICD-10-CM | POA: Diagnosis present

## 2016-06-07 DIAGNOSIS — I5032 Chronic diastolic (congestive) heart failure: Secondary | ICD-10-CM | POA: Diagnosis not present

## 2016-06-07 DIAGNOSIS — R197 Diarrhea, unspecified: Secondary | ICD-10-CM | POA: Diagnosis not present

## 2016-06-07 LAB — GASTROINTESTINAL PANEL BY PCR, STOOL (REPLACES STOOL CULTURE)

## 2016-06-07 LAB — COMPREHENSIVE METABOLIC PANEL
ALT: 18 U/L (ref 14–54)
AST: 24 U/L (ref 15–41)
Albumin: 2.7 g/dL — ABNORMAL LOW (ref 3.5–5.0)
Alkaline Phosphatase: 53 U/L (ref 38–126)
Anion gap: 6 (ref 5–15)
BUN: 16 mg/dL (ref 6–20)
CO2: 22 mmol/L (ref 22–32)
Calcium: 7.5 mg/dL — ABNORMAL LOW (ref 8.9–10.3)
Chloride: 108 mmol/L (ref 101–111)
Creatinine, Ser: 0.8 mg/dL (ref 0.44–1.00)
GFR calc Af Amer: >60 mL/min (ref >60)
GFR calc non Af Amer: >60 mL/min (ref >60)
Glucose, Bld: 126 mg/dL — ABNORMAL HIGH (ref 65–99)
Potassium: 3.5 mmol/L (ref 3.5–5.1)
Sodium: 136 mmol/L (ref 135–145)
Total Bilirubin: 0.7 mg/dL (ref 0.3–1.2)
Total Protein: 5.3 g/dL — ABNORMAL LOW (ref 6.5–8.1)

## 2016-06-07 LAB — ECHOCARDIOGRAM COMPLETE
E decel time: 137 msec
E/e' ratio: 15.24
FS: 31 % (ref 28–44)
IVS/LV PW RATIO, ED: 0.89
LA ID, A-P, ES: 40 mm
LA diam end sys: 40 mm
LA vol A4C: 45.2 ml
LA vol: 56.1 mL
LV E/e' medial: 15.24
LV E/e'average: 15.24
LV PW d: 9.55 mm — AB (ref 0.6–1.1)
LV e' LATERAL: 7.94 cm/s
LVOT area: 3.14 cm2
LVOT diameter: 20 mm
MV Dec: 137
MV Peak grad: 6 mmHg
MV pk E vel: 121 m/s
TDI e' lateral: 7.94
TDI e' medial: 6.31

## 2016-06-07 LAB — C DIFFICILE QUICK SCREEN W PCR REFLEX
C Diff antigen: NEGATIVE
C Diff interpretation: NEGATIVE
C Diff toxin: NEGATIVE

## 2016-06-07 MED ORDER — DIPHENHYDRAMINE HCL 25 MG PO CAPS
ORAL_CAPSULE | ORAL | Status: AC
  Filled 2016-06-07: qty 1

## 2016-06-07 MED ORDER — LOPERAMIDE HCL 2 MG PO CAPS
2.0000 mg | ORAL_CAPSULE | ORAL | Status: DC | PRN
  Administered 2016-06-07: 2 mg via ORAL
  Filled 2016-06-07: qty 1

## 2016-06-07 MED ORDER — SODIUM CHLORIDE 0.9 % IV SOLN
INTRAVENOUS | Status: DC
  Administered 2016-06-07 – 2016-06-08 (×2): via INTRAVENOUS
  Administered 2016-06-09: 1000 mL via INTRAVENOUS
  Administered 2016-06-09 – 2016-06-10 (×2): via INTRAVENOUS

## 2016-06-07 MED ORDER — DIPHENHYDRAMINE HCL 25 MG PO CAPS
25.0000 mg | ORAL_CAPSULE | Freq: Once | ORAL | Status: AC
  Administered 2016-06-07: 25 mg via ORAL

## 2016-06-07 NOTE — Progress Notes (Signed)
PROGRESS NOTE        PATIENT DETAILS Name: [name] Dyer Age: 82 y.o. Sex: female Date of Birth: 1932/10/20 Admit Date: 06/06/2016 Admitting Physician [name], MD [id number], MD  Brief Narrative: Patient is a 82 y.o. female with history of PAF on anticoagulation, chronic diastolic heart failure, hypertension who presented with 2 week history of diarrhea and confusion. Had seen GI M.D. a few days prior to this admission, and was empirically started on Flagyl.  Subjective: Continues to have loose watery stools (7-8 episodes in the past 24 hours). Confusion has resolved-she is awake and alert and seems very frustrated.  Assessment/Plan: Principal Problem: Colitis presumed infectious: Continues to have diarrhea, stool GI pathogen panel negative, C. difficile PCR pending. Continue empiric ciprofloxacin Flagyl. A stool workup continues to be negative, we can try a trial of Imodium. She's apparently had a few episodes of diarrhea in the past, may need a updated colonoscopy at some point.  Active Problems: Acute encephalopathy: Suspect secondary to dehydration due to significant amount of diarrhea.Resolved with IV fluids.  Chronic diastolic CHF (congestive heart failure): Clinically compensated, decrease IV fluids-follow volume status closely.  Paroxysmal atrial fibrillation: Rate controlled with atenolol, continue anticoagulation with Eliquis.CHADS2VASC of atleast 3  Hypertension: Controlled with atenolol, resume Lasix in the next 1-2 days  Dyslipidemia: Continue Zocor  Hyponatremia: Secondary to dehydration, resolved with IV fluids.  Hypokalemia:Repleted, likely secondary to diarrhea.  DVT Prophylaxis: Full dose anticoagulation with Eliquis  Code Status: Full code   Family Communication: None at bedside-patient is completely awake and alert, and understanding of above noted plan.  Disposition Plan: Remain inpatient-home tomorrow or  day after tomorrow  Antimicrobial agents: IV ciprofloxacin 6/9>> IV Flagyl 6/9>>  Procedures: None  CONSULTS:  None  Time spent: 25 minutes-Greater than 50% of this time was spent in counseling, explanation of diagnosis, planning of further management, and coordination of care.  MEDICATIONS: Anti-infectives    Start     Dose/Rate Route Frequency Ordered Stop   06/07/16 0800  ciprofloxacin (CIPRO) IVPB 400 mg     400 mg 200 mL/hr over 60 Minutes Intravenous Every 12 hours 06/06/16 2056     06/07/16 0400  metroNIDAZOLE (FLAGYL) IVPB 500 mg     500 mg 100 mL/hr over 60 Minutes Intravenous Every 8 hours 06/06/16 2056     06/06/16 1930  ciprofloxacin (CIPRO) tablet 500 mg     500 mg Oral  Once 06/06/16 1924 06/06/16 2018   06/06/16 1930  metroNIDAZOLE (FLAGYL) tablet 500 mg     500 mg Oral  Once 06/06/16 1924 06/06/16 2018      Scheduled Meds: . apixaban  5 mg Oral BID  . atenolol  25 mg Oral BID  . calcium-vitamin D  1 tablet Oral BID  . ciprofloxacin  400 mg Intravenous Q12H  . metronidazole  500 mg Intravenous Q8H  . simvastatin  10 mg Oral Daily   Continuous Infusions: . sodium chloride 75 mL/hr at 06/07/16 1154   PRN Meds:.acetaminophen **OR** acetaminophen, HYDROcodone-acetaminophen, ondansetron **OR** ondansetron (ZOFRAN) IV   PHYSICAL EXAM: Vital signs: Filed Vitals:   06/06/16 1723 06/06/16 2108 06/06/16 2132 06/07/16 0609  BP: 121/70 104/62 117/103 120/58  Pulse: 96 90 94 94  Temp: 97.9 F (36.6 C)  97.6 F (36.4 C) 99.1 F (37.3 C)  TempSrc: Oral  Oral  Resp: _0 SpO2: 97% 99% 100% 100%   There were no vitals filed for this visit. There is no weight on file to calculate BMI.   Gen Exam: Awake and alert with clear speech. Not in any distress  Neck: Supple, No JVD.  Chest: B/L Clear.   CVS: S1 S2 Regular, no murmurs. Abdomen: soft, BS +, non tender, non distended.  Extremities: no edema, lower extremities warm to touch. Neurologic: Non  Focal.   Skin: No Rash or lesions   Wounds: N/A.    LABORATORY DATA: CBC:  Recent Labs Lab 06/02/16 1956 06/06/16 1716 06/06/16 2017  WBC 8.6 11.2*  --   NEUTROABS  --  9.0*  --   HGB 12.9 13.1  --   HCT 38.7 39.9  --   MCV 89.4 89.1  --   PLT PLATELET CLUMPS NOTED ON SMEAR, UNABLE TO ESTIMATE PLATELET CLUMPS NOTED ON SMEAR, COUNT APPEARS ADEQUATE 448    Basic Metabolic Panel:  Recent Labs Lab 06/02/16 1956 06/06/16 1716 06/06/16 2017 06/07/16 0529  NA 136 133*  --  136  K 4.1 3.4*  --  3.5  CL 106 99*  --  108  CO2 24 26  --  22  GLUCOSE 102* 112*  --  126*  BUN 13 16  --  16  CREATININE 0.94 0.92  --  0.80  CALCIUM 8.7* 8.3*  --  7.5*  MG  --   --  1.8  --     GFR: Estimated Creatinine Clearance: 43.7 mL/min (by C-G formula based on Cr of 0.8).  Liver Function Tests:  Recent Labs Lab 06/02/16 1956 06/06/16 1716 06/07/16 0529  AST _1 ALT _2 ALKPHOS 69 65 53  BILITOT 1.2 1.5* 0.7  PROT 6.8 6.7 5.3*  ALBUMIN 3.8 3.6 2.7*   No results for input(s): LIPASE, AMYLASE in the last 168 hours.  Recent Labs Lab 06/06/16 2017  AMMONIA 20    Coagulation Profile: No results for input(s): INR, PROTIME in the last 168 hours.  Cardiac Enzymes: No results for input(s): CKTOTAL, CKMB, CKMBINDEX, TROPONINI in the last 168 hours.  BNP (last 3 results)  Recent Labs  08/27/15 1021  PROBNP 318.0*    HbA1C: No results for input(s): HGBA1C in the last 72 hours.  CBG: No results for input(s): GLUCAP in the last 168 hours.  Lipid Profile: No results for input(s): CHOL, HDL, LDLCALC, TRIG, CHOLHDL, LDLDIRECT in the last 72 hours.  Thyroid Function Tests: No results for input(s): TSH, T4TOTAL, FREET4, T3FREE, THYROIDAB in the last 72 hours.  Anemia Panel: No results for input(s): VITAMINB12, FOLATE, FERRITIN, TIBC, IRON, RETICCTPCT in the last 72 hours.  Urine analysis:    Component Value Date/Time   COLORURINE YELLOW 06/06/2016 1810    APPEARANCEUR CLOUDY* 06/06/2016 1810   LABSPEC 1.015 06/06/2016 1810   PHURINE 5.5 06/06/2016 1810   GLUCOSEU NEGATIVE 06/06/2016 1810   HGBUR MODERATE* 06/06/2016 1810   BILIRUBINUR NEGATIVE 06/06/2016 1810   KETONESUR NEGATIVE 06/06/2016 1810   PROTEINUR NEGATIVE 06/06/2016 1810   UROBILINOGEN 0.2 03/22/2015 1302   NITRITE NEGATIVE 06/06/2016 1810   LEUKOCYTESUR LARGE* 06/06/2016 1810    Sepsis Labs: Lactic Acid, Venous No results found for: LATICACIDVEN  MICROBIOLOGY: Recent Results (from the past 240 hour(s))  Gastrointestinal Panel by PCR , Stool     Status: None   Collection Time: 06/07/16  1:06 AM  Result Value Ref Range  Status   Campylobacter species NOT DETECTED NOT DETECTED Final   Plesimonas shigelloides NOT DETECTED NOT DETECTED Final   Salmonella species NOT DETECTED NOT DETECTED Final   Yersinia enterocolitica NOT DETECTED NOT DETECTED Final   Vibrio species NOT DETECTED NOT DETECTED Final   Vibrio cholerae NOT DETECTED NOT DETECTED Final   Enteroaggregative E coli (EAEC) NOT DETECTED NOT DETECTED Final   Enteropathogenic E coli (EPEC) NOT DETECTED NOT DETECTED Final   Enterotoxigenic E coli (ETEC) NOT DETECTED NOT DETECTED Final   Shiga like toxin producing E coli (STEC) NOT DETECTED NOT DETECTED Final   E. coli O157 NOT DETECTED NOT DETECTED Final   Shigella/Enteroinvasive E coli (EIEC) NOT DETECTED NOT DETECTED Final   Cryptosporidium NOT DETECTED NOT DETECTED Final   Cyclospora cayetanensis NOT DETECTED NOT DETECTED Final   Entamoeba histolytica NOT DETECTED NOT DETECTED Final   Giardia lamblia NOT DETECTED NOT DETECTED Final   Adenovirus F40/41 NOT DETECTED NOT DETECTED Final   Astrovirus NOT DETECTED NOT DETECTED Final   Norovirus GI/GII NOT DETECTED NOT DETECTED Final   Rotavirus A NOT DETECTED NOT DETECTED Final   Sapovirus (I, II, IV, and V) NOT DETECTED NOT DETECTED Final    RADIOLOGY STUDIES/RESULTS: Ct Abdomen Pelvis W Contrast  06/06/2016   CLINICAL DATA:  82 year old female with abdominal pain and diarrhea. EXAM: CT ABDOMEN AND PELVIS WITH CONTRAST TECHNIQUE: Multidetector CT imaging of the abdomen and pelvis was performed using the standard protocol following bolus administration of intravenous contrast. CONTRAST:  119m ISOVUE-300 IOPAMIDOL (ISOVUE-300) INJECTION 61% COMPARISON:  CT dated 03/22/2015 FINDINGS: The visualized lung bases are clear. There is dilatation of the right cardiac chambers primarily involving the right atrium. Correlation with echocardiogram recommended. No intra-abdominal free air or free fluid identified There is mottled pattern of contrast enhancement of the liver In the arterial phase compatible with nutmeg liver and secondary to hepatic venous congestion possibly as a result of right cardiac dysfunction. The gallbladder pancreas, spleen, adrenal glands, kidneys, visualized ureters, and urinary bladder appear unremarkable. Hysterectomy. Evaluation of the pelvic structures is limited due to streak artifact caused by left hip arthroplasty. There is thickened appearance of the distal colon and rectosigmoid most compatible with colitis. Correlation with clinical exam and stool cultures recommended. There is no evidence of bowel obstruction. Appendectomy. There is mild aortoiliac atherosclerotic disease. The origins of the celiac axis, SMA, and IMA appear patent. No portal venous gas identified. The SMV, splenic vein, and main portal veins are patent. There is no adenopathy. The abdominal wall soft tissues appear unremarkable. There is degenerative changes of the spine. Left hip arthroplasty. No acute fracture. IMPRESSION: Findings most compatible with colitis. Correlation with clinical exam and stool cultures recommended. No bowel obstruction. Mottled enhancement pattern of the liver compatible with hepatic venous congestion likely secondary to right cardiac dysfunction. Electronically Signed   By: AAnner CreteM.D.   On:  06/06/2016 18:50   Dg Abd Acute W/chest  06/02/2016  CLINICAL DATA:  [age]year old female with abdominal pain and diarrhea. EXAM: DG ABDOMEN ACUTE W/ 1V CHEST COMPARISON:  CT dated 03/22/2015 FINDINGS: The lungs are clear. There is no pleural effusion or pneumothorax. The cardiac silhouette is within normal limits. Moderate stool throughout the colon. There is no bowel dilatation or evidence of obstruction. No free air or radiopaque calculi. There is osteopenia with degenerative changes of spine. Left hip arthroplasty. No acute fracture. IMPRESSION: Negative abdominal radiographs.  No acute cardiopulmonary disease. Electronically Signed   By: AMilas Hock  Radparvar M.D.   On: 06/02/2016 23:03       Oren Binet, MD  [facility] Pager:[phone]  If 7PM-7AM, please contact night-coverage [email or web address] Password [email or web address] 06/07/2016, 12:16 PM

## 2016-06-07 NOTE — Progress Notes (Signed)
  Echocardiogram 2D Echocardiogram has been performed.  Tresa Res 06/07/2016, 11:05 AM

## 2016-06-07 NOTE — Progress Notes (Signed)
Pt states that she needs something to help her sleep. Requests antihistamine. Message sent to on call. Will continue to monitor.

## 2016-06-08 LAB — BASIC METABOLIC PANEL
Anion gap: 6 (ref 5–15)
BUN: 9 mg/dL (ref 6–20)
CO2: 21 mmol/L — ABNORMAL LOW (ref 22–32)
Calcium: 8 mg/dL — ABNORMAL LOW (ref 8.9–10.3)
Chloride: 112 mmol/L — ABNORMAL HIGH (ref 101–111)
Creatinine, Ser: 0.77 mg/dL (ref 0.44–1.00)
GFR calc Af Amer: >60 mL/min (ref >60)
GFR calc non Af Amer: >60 mL/min (ref >60)
Glucose, Bld: 107 mg/dL — ABNORMAL HIGH (ref 65–99)
Potassium: 3.6 mmol/L (ref 3.5–5.1)
Sodium: 139 mmol/L (ref 135–145)

## 2016-06-08 LAB — HEPARIN LEVEL (UNFRACTIONATED): Heparin Unfractionated: >2.2 IU/mL — ABNORMAL HIGH (ref 0.30–0.70)

## 2016-06-08 LAB — PROCALCITONIN: Procalcitonin: 0.38 ng/mL

## 2016-06-08 LAB — APTT
aPTT: 38 seconds — ABNORMAL HIGH (ref 24–37)
aPTT: 69 seconds — ABNORMAL HIGH (ref 24–37)

## 2016-06-08 MED ORDER — HEPARIN (PORCINE) IN NACL 100-0.45 UNIT/ML-% IJ SOLN
700.0000 [IU]/h | INTRAMUSCULAR | Status: DC
  Administered 2016-06-08: 700 [IU]/h via INTRAVENOUS
  Filled 2016-06-08: qty 250

## 2016-06-08 MED ORDER — DIPHENOXYLATE-ATROPINE 2.5-0.025 MG PO TABS
1.0000 | ORAL_TABLET | Freq: Three times a day (TID) | ORAL | Status: DC
  Administered 2016-06-08 – 2016-06-09 (×4): 1 via ORAL
  Filled 2016-06-08 (×4): qty 1

## 2016-06-08 NOTE — Progress Notes (Signed)
ANTICOAGULATION CONSULT NOTE - Initial Consult  Pharmacy Consult for Heparin Indication: atrial fibrillation  Allergies  Allergen Reactions  . Sulfa Antibiotics Hives    Severe Rash    Patient Measurements: Height: 5' 5" (165.1 cm) Weight: 130 lb (58.968 kg) IBW/kg (Calculated) : 57  Vital Signs: Temp: 97.6 F (36.4 C) (06/11 1450) Temp Source: Oral (06/11 1450) BP: 115/69 mmHg (06/11 1450) Pulse Rate: 92 (06/11 1450)  Labs:  Recent Labs  06/06/16 1716 06/06/16 2017 06/07/16 0529 06/08/16 0554 06/08/16 1029 06/08/16 2010  HGB 13.1  --   --   --   --   --   HCT 39.9  --   --   --   --   --   PLT PLATELET CLUMPS NOTED ON SMEAR, COUNT APPEARS ADEQUATE 193  --   --   --   --   APTT  --   --   --   --  38* 69*  HEPARINUNFRC  --   --   --   --  >2.20*  --   CREATININE 0.92  --  0.80 0.77  --   --     Estimated Creatinine Clearance: 48.8 mL/min (by C-G formula based on Cr of 0.77).   Medical History: Past Medical History  Diagnosis Date  . Anemia   . Arrhythmia   . Colon polyps   . Congestive heart failure ([place])   . Hyperlipidemia   . Hypertension   . UTI (lower urinary tract infection)   . Community acquired pneumonia Feb 2013    LLL  . Pleural effusion, right Feb 2013  . Osteoporosis   . SVT (supraventricular tachycardia) ([place]) feb 2013  . Cataract     Medications:  . sodium chloride 50 mL/hr at 06/08/16 0952  . heparin 700 Units/hr (06/08/16 1155)   Assessment: 82 yo F on chronic Eliquis for Afib.  She was admitted with persistent diarrhea and plans for colonoscopy.   Pharmacy is now consulted to change to IV heparin peri-procedure.  Last dose Eliquis @ 2141 on 06/07/16.   Baseline heparin level elevated (>2.2) due to administration of Eliquis.  APTT =38sec at baseline which as at upper end of normal.  Will monitor heparin using aPTT levels until Eliquis no longer influencing heparin levels.    No bleeding noted at baseline.  CBC wnl.  Goal of  Therapy:  APTT 66-102 sec  Heparin level 0.3-0.7 units/ml Monitor platelets by anticoagulation protocol: Yes   Plan:  Start heparin 700 units/hr (no bolus) Check 8hr aPTT Daily aPTT, heparin level, CBC while on heparin F/U plans for procedure  Netta Cedars, PharmD, BCPS Pager: [phone] 06/08/2016,8:44 PM  Initial aPTT therapeutic (=69sec). Continue heparin 700 units/hr F/U am aPTT, HL, CBC  Netta Cedars, PharmD, BCPS Pager: [phone] 06/08/2016_0 :45 PM

## 2016-06-08 NOTE — Progress Notes (Signed)
ANTICOAGULATION CONSULT NOTE - Initial Consult  Pharmacy Consult for Heparin Indication: atrial fibrillation  Allergies  Allergen Reactions  . Sulfa Antibiotics Hives    Severe Rash    Patient Measurements: Height: _0  (165.1 cm) Weight: 130 lb (58.968 kg) IBW/kg (Calculated) : 57  Vital Signs: Temp: 97.8 F (36.6 C) (06/11 0613) Temp Source: Oral (06/11 9861) BP: 131/63 mmHg (06/11 0613) Pulse Rate: 86 (06/11 0613)  Labs:  Recent Labs  06/06/16 1716 06/06/16 2017 06/07/16 0529 06/08/16 0554  HGB 13.1  --   --   --   HCT 39.9  --   --   --   PLT PLATELET CLUMPS NOTED ON SMEAR, COUNT APPEARS ADEQUATE 193  --   --   CREATININE 0.92  --  0.80 0.77    Estimated Creatinine Clearance: 48.8 mL/min (by C-G formula based on Cr of 0.77).   Medical History: Past Medical History  Diagnosis Date  . Anemia   . Arrhythmia   . Colon polyps   . Congestive heart failure ([place])   . Hyperlipidemia   . Hypertension   . UTI (lower urinary tract infection)   . Community acquired pneumonia Feb 2013    LLL  . Pleural effusion, right Feb 2013  . Osteoporosis   . SVT (supraventricular tachycardia) ([place]) feb 2013  . Cataract     Medications:  . sodium chloride 50 mL/hr at 06/08/16 4830   Assessment: 82 yo F on chronic Eliquis for Afib.  She was admitted with persistent diarrhea and plans for colonoscopy.   Pharmacy is now consulted to change to IV heparin peri-procedure.  Last dose Eliquis @ 2141 on 06/07/16.   Baseline heparin level elevated (>2.2) due to administration of Eliquis.  APTT =38sec at baseline which as at upper end of normal.  Will monitor heparin using aPTT levels until Eliquis no longer influencing heparin levels.    No bleeding noted at baseline.  CBC wnl.  Goal of Therapy:  APTT 66-102 sec  Heparin level 0.3-0.7 units/ml Monitor platelets by anticoagulation protocol: Yes   Plan:  Start heparin 700 units/hr (no bolus) Check 8hr aPTT Daily aPTT, heparin  level, CBC while on heparin F/U plans for procedure  Netta Cedars, PharmD, BCPS Pager: [phone] 06/08/2016,10:09 AM

## 2016-06-08 NOTE — Progress Notes (Addendum)
PROGRESS NOTE        PATIENT DETAILS Name: [name] Dyer Age: 82 y.o. Sex: female Date of Birth: [birth year] Admit Date: 06/06/2016 Admitting Physician [name], MD PTE:[email or web address],DUPBDH B, MD  Brief Narrative: Patient is a 82 y.o. female with history of PAF on anticoagulation, chronic diastolic heart failure, hypertension who presented with 2 week history of diarrhea and confusion. Had seen GI M.D. a few days prior to this admission, and was empirically started on Flagyl. CT scan on admission was suggestive of colitis involving the distal colon, and rectosigmoid area. Stool GI pathogen panel, and C. difficile PCR are negative. She interestingly had similar symptoms in March 2016, CT scan abdomen and then also showed colitis involving the descending colon and sigmoid colon. Hospital course has been complicated by persistent diarrhea, GI consultation has been obtained-GI will formally see patient on 6/12.  Subjective: Continues to have loose watery stools (close to 10 episodes in the past 24 hours). Confusion has resolved-she is awake and alert   Assessment/Plan: Principal Problem: Colitis: Initially presumed to be infectious and empirically started on Cipro/Flagyl, however stool GI pathogen panel and C. difficile PCR are negative. Will discontinue Cipro/Flagyl negative, start a trial of Lomotil. I suspect she needs a colonoscopy with biopsy to rule out collagenous colitis and other etiologies. She continues to have significant amount of diarrhea, and is at risk for dehydration at discharge with this much of diarrhea. Hence will obtain a inpatient GI consultation.  She is on Eliquis, per Dr. Collene Mares she needs to be off this for Eliquis least 24-48 hours before proceeding with a colonoscopy. Her last dose was 6/10 PM.  Active Problems: Acute encephalopathy: Suspect secondary to dehydration due to significant amount of diarrhea.Resolved with IV fluids.  Chronic  diastolic CHF (congestive heart failure): Clinically compensated, decrease IV fluids-follow volume status closely.  Paroxysmal atrial fibrillation: Rate controlled with atenolol, will hold Eliquis-and placed on IV heparin until colonoscopy and other procedures can be completed. CHADS2VASC of atleast 3  Hypertension: Controlled with atenolol, resume Lasix once diarrhea is better.  Dyslipidemia: Continue Zocor  Hyponatremia: Secondary to dehydration, resolved with IV fluids.  Hypokalemia:Repleted, likely secondary to diarrhea.  DVT Prophylaxis: Full dose anticoagulation with Eliquis-now on IV heparin.   Code Status: Full code   Family Communication: [name] pastor at bedside-spoke with patient's permission- patient is completely awake and alert, and understanding of above noted plan.  Disposition Plan: Remain home once GI evaluation is complete  Antimicrobial agents: IV ciprofloxacin 6/9>>6/11 IV Flagyl 6/9>>6/11  Procedures: None  CONSULTS:  None  Time spent: 25 minutes-Greater than 50% of this time was spent in counseling, explanation of diagnosis, planning of further management, and coordination of care.  MEDICATIONS: Anti-infectives    Start     Dose/Rate Route Frequency Ordered Stop   06/07/16 0800  ciprofloxacin (CIPRO) IVPB 400 mg  Status:  Discontinued     400 mg 200 mL/hr over 60 Minutes Intravenous Every 12 hours 06/06/16 2056 06/08/16 0848   06/07/16 0400  metroNIDAZOLE (FLAGYL) IVPB 500 mg  Status:  Discontinued     500 mg 100 mL/hr over 60 Minutes Intravenous Every 8 hours 06/06/16 2056 06/08/16 0848   06/06/16 1930  ciprofloxacin (CIPRO) tablet 500 mg     500 mg Oral  Once 06/06/16 1924 06/06/16 2018   06/06/16 1930  metroNIDAZOLE (  FLAGYL) tablet 500 mg     500 mg Oral  Once 06/06/16 1924 06/06/16 2018      Scheduled Meds: . apixaban  5 mg Oral BID  . atenolol  25 mg Oral BID  . calcium-vitamin D  1 tablet Oral BID  . diphenoxylate-atropine  1  tablet Oral TID  . simvastatin  10 mg Oral Daily   Continuous Infusions: . sodium chloride 50 mL/hr at 06/07/16 1233   PRN Meds:.acetaminophen **OR** acetaminophen, HYDROcodone-acetaminophen, ondansetron **OR** ondansetron (ZOFRAN) IV   PHYSICAL EXAM: Vital signs: Filed Vitals:   06/07/16 2019 06/07/16 2141 06/07/16 2307 06/08/16 0613  BP:  116/66 125/65 131/63  Pulse:  82 83 86  Temp:   98.4 F (36.9 C) 97.8 F (36.6 C)  TempSrc:   Oral Oral  Resp:   19 20  Height: 5' 5" (1.651 m)     Weight: 58.968 kg (130 lb)     SpO2:   98% 100%   Filed Weights   06/07/16 2019  Weight: 58.968 kg (130 lb)   Body mass index is 21.63 kg/(m^2).   Gen Exam: Awake and alert with clear speech. Not in any distress  Neck: Supple, No JVD.  Chest: B/L Clear.   CVS: S1 S2 Regular, no murmurs. Abdomen: soft, BS +, non tender, non distended.  Extremities: no edema, lower extremities warm to touch. Neurologic: Non Focal.   Skin: No Rash or lesions   Wounds: N/A.    LABORATORY DATA: CBC:  Recent Labs Lab 06/02/16 1956 06/06/16 1716 06/06/16 2017  WBC 8.6 11.2*  --   NEUTROABS  --  9.0*  --   HGB 12.9 13.1  --   HCT 38.7 39.9  --   MCV 89.4 89.1  --   PLT PLATELET CLUMPS NOTED ON SMEAR, UNABLE TO ESTIMATE PLATELET CLUMPS NOTED ON SMEAR, COUNT APPEARS ADEQUATE 282    Basic Metabolic Panel:  Recent Labs Lab 06/02/16 1956 06/06/16 1716 06/06/16 2017 06/07/16 0529 06/08/16 0554  NA 136 133*  --  136 139  K 4.1 3.4*  --  3.5 3.6  CL 106 99*  --  108 112*  CO2 24 26  --  22 21*  GLUCOSE 102* 112*  --  126* 107*  BUN 13 16  --  16 9  CREATININE 0.94 0.92  --  0.80 0.77  CALCIUM 8.7* 8.3*  --  7.5* 8.0*  MG  --   --  1.8  --   --     GFR: Estimated Creatinine Clearance: 48.8 mL/min (by C-G formula based on Cr of 0.77).  Liver Function Tests:  Recent Labs Lab 06/02/16 1956 06/06/16 1716 06/07/16 0529  AST _0 ALT _1 ALKPHOS 69 65 53  BILITOT 1.2 1.5* 0.7   PROT 6.8 6.7 5.3*  ALBUMIN 3.8 3.6 2.7*   No results for input(s): LIPASE, AMYLASE in the last 168 hours.  Recent Labs Lab 06/06/16 2017  AMMONIA 20    Coagulation Profile: No results for input(s): INR, PROTIME in the last 168 hours.  Cardiac Enzymes: No results for input(s): CKTOTAL, CKMB, CKMBINDEX, TROPONINI in the last 168 hours.  BNP (last 3 results)  Recent Labs  08/27/15 1021  PROBNP 318.0*    HbA1C: No results for input(s): HGBA1C in the last 72 hours.  CBG: No results for input(s): GLUCAP in the last 168 hours.  Lipid Profile: No results for input(s): CHOL, HDL, LDLCALC, TRIG, CHOLHDL, LDLDIRECT in  the last 72 hours.  Thyroid Function Tests: No results for input(s): TSH, T4TOTAL, FREET4, T3FREE, THYROIDAB in the last 72 hours.  Anemia Panel: No results for input(s): VITAMINB12, FOLATE, FERRITIN, TIBC, IRON, RETICCTPCT in the last 72 hours.  Urine analysis:    Component Value Date/Time   COLORURINE YELLOW 06/06/2016 1810   APPEARANCEUR CLOUDY* 06/06/2016 1810   LABSPEC 1.015 06/06/2016 1810   PHURINE 5.5 06/06/2016 1810   GLUCOSEU NEGATIVE 06/06/2016 1810   HGBUR MODERATE* 06/06/2016 1810   BILIRUBINUR NEGATIVE 06/06/2016 1810   KETONESUR NEGATIVE 06/06/2016 1810   PROTEINUR NEGATIVE 06/06/2016 1810   UROBILINOGEN 0.2 03/22/2015 1302   NITRITE NEGATIVE 06/06/2016 1810   LEUKOCYTESUR LARGE* 06/06/2016 1810    Sepsis Labs: Lactic Acid, Venous No results found for: LATICACIDVEN  MICROBIOLOGY: Recent Results (from the past 240 hour(s))  Gastrointestinal Panel by PCR , Stool     Status: None   Collection Time: 06/07/16  1:06 AM  Result Value Ref Range Status   Campylobacter species NOT DETECTED NOT DETECTED Final   Plesimonas shigelloides NOT DETECTED NOT DETECTED Final   Salmonella species NOT DETECTED NOT DETECTED Final   Yersinia enterocolitica NOT DETECTED NOT DETECTED Final   Vibrio species NOT DETECTED NOT DETECTED Final   Vibrio  cholerae NOT DETECTED NOT DETECTED Final   Enteroaggregative E coli (EAEC) NOT DETECTED NOT DETECTED Final   Enteropathogenic E coli (EPEC) NOT DETECTED NOT DETECTED Final   Enterotoxigenic E coli (ETEC) NOT DETECTED NOT DETECTED Final   Shiga like toxin producing E coli (STEC) NOT DETECTED NOT DETECTED Final   E. coli O157 NOT DETECTED NOT DETECTED Final   Shigella/Enteroinvasive E coli (EIEC) NOT DETECTED NOT DETECTED Final   Cryptosporidium NOT DETECTED NOT DETECTED Final   Cyclospora cayetanensis NOT DETECTED NOT DETECTED Final   Entamoeba histolytica NOT DETECTED NOT DETECTED Final   Giardia lamblia NOT DETECTED NOT DETECTED Final   Adenovirus F40/41 NOT DETECTED NOT DETECTED Final   Astrovirus NOT DETECTED NOT DETECTED Final   Norovirus GI/GII NOT DETECTED NOT DETECTED Final   Rotavirus A NOT DETECTED NOT DETECTED Final   Sapovirus (I, II, IV, and V) NOT DETECTED NOT DETECTED Final  C difficile quick scan w PCR reflex     Status: None   Collection Time: 06/07/16  3:30 PM  Result Value Ref Range Status   C Diff antigen NEGATIVE NEGATIVE Final   C Diff toxin NEGATIVE NEGATIVE Final   C Diff interpretation Negative for toxigenic C. difficile  Final    RADIOLOGY STUDIES/RESULTS: Ct Abdomen Pelvis W Contrast  06/06/2016  CLINICAL DATA:  82 year old female with abdominal pain and diarrhea. EXAM: CT ABDOMEN AND PELVIS WITH CONTRAST TECHNIQUE: Multidetector CT imaging of the abdomen and pelvis was performed using the standard protocol following bolus administration of intravenous contrast. CONTRAST:  133m ISOVUE-300 IOPAMIDOL (ISOVUE-300) INJECTION 61% COMPARISON:  CT dated 03/22/2015 FINDINGS: The visualized lung bases are clear. There is dilatation of the right cardiac chambers primarily involving the right atrium. Correlation with echocardiogram recommended. No intra-abdominal free air or free fluid identified There is mottled pattern of contrast enhancement of the liver In the arterial  phase compatible with nutmeg liver and secondary to hepatic venous congestion possibly as a result of right cardiac dysfunction. The gallbladder pancreas, spleen, adrenal glands, kidneys, visualized ureters, and urinary bladder appear unremarkable. Hysterectomy. Evaluation of the pelvic structures is limited due to streak artifact caused by left hip arthroplasty. There is thickened appearance of the distal colon  and rectosigmoid most compatible with colitis. Correlation with clinical exam and stool cultures recommended. There is no evidence of bowel obstruction. Appendectomy. There is mild aortoiliac atherosclerotic disease. The origins of the celiac axis, SMA, and IMA appear patent. No portal venous gas identified. The SMV, splenic vein, and main portal veins are patent. There is no adenopathy. The abdominal wall soft tissues appear unremarkable. There is degenerative changes of the spine. Left hip arthroplasty. No acute fracture. IMPRESSION: Findings most compatible with colitis. Correlation with clinical exam and stool cultures recommended. No bowel obstruction. Mottled enhancement pattern of the liver compatible with hepatic venous congestion likely secondary to right cardiac dysfunction. Electronically Signed   By: Anner Crete M.D.   On: 06/06/2016 18:50   Dg Abd Acute W/chest  06/02/2016  CLINICAL DATA:  82 year old female with abdominal pain and diarrhea. EXAM: DG ABDOMEN ACUTE W/ 1V CHEST COMPARISON:  CT dated 03/22/2015 FINDINGS: The lungs are clear. There is no pleural effusion or pneumothorax. The cardiac silhouette is within normal limits. Moderate stool throughout the colon. There is no bowel dilatation or evidence of obstruction. No free air or radiopaque calculi. There is osteopenia with degenerative changes of spine. Left hip arthroplasty. No acute fracture. IMPRESSION: Negative abdominal radiographs.  No acute cardiopulmonary disease. Electronically Signed   By: Anner Crete M.D.   On:  06/02/2016 23:03     LOS: 1 day   Oren Binet, MD  [facility] Pager:[phone]  If 7PM-7AM, please contact night-coverage [email or web address] Password [facility] 06/08/2016, 9:49 AM

## 2016-06-09 DIAGNOSIS — R197 Diarrhea, unspecified: Secondary | ICD-10-CM

## 2016-06-09 LAB — CBC
HCT: 34.8 % — ABNORMAL LOW (ref 36.0–46.0)
HCT: 33.6 % — ABNORMAL LOW (ref 36.0–46.0)
Hemoglobin: 11.3 g/dL — ABNORMAL LOW (ref 12.0–15.0)
Hemoglobin: 10.8 g/dL — ABNORMAL LOW (ref 12.0–15.0)
MCH: 28.5 pg (ref 26.0–34.0)
MCH: 28.3 pg (ref 26.0–34.0)
MCHC: 32.5 g/dL (ref 30.0–36.0)
MCHC: 32.1 g/dL (ref 30.0–36.0)
MCV: 87.7 fL (ref 78.0–100.0)
MCV: 88.2 fL (ref 78.0–100.0)
Platelets: UNDETERMINED 10*3/uL (ref 150–400)
Platelets: 60 10*3/uL — ABNORMAL LOW (ref 150–400)
RBC: 3.97 MIL/uL (ref 3.87–5.11)
RBC: 3.81 MIL/uL — ABNORMAL LOW (ref 3.87–5.11)
RDW: 14.4 % (ref 11.5–15.5)
RDW: 14.6 % (ref 11.5–15.5)
WBC: 7.1 10*3/uL (ref 4.0–10.5)
WBC: 7.4 10*3/uL (ref 4.0–10.5)

## 2016-06-09 LAB — GASTROINTESTINAL PATHOGEN PANEL PCR
C. difficile Tox A/B, PCR: DETECTED — CR
Campylobacter, PCR: NOT DETECTED
Cryptosporidium, PCR: NOT DETECTED
E coli (ETEC) LT/ST PCR: NOT DETECTED
E coli (STEC) stx1/stx2, PCR: NOT DETECTED
E coli 0157, PCR: NOT DETECTED
Giardia lamblia, PCR: NOT DETECTED
Norovirus, PCR: NOT DETECTED
Rotavirus A, PCR: NOT DETECTED
Salmonella, PCR: NOT DETECTED
Shigella, PCR: NOT DETECTED

## 2016-06-09 LAB — HEPARIN LEVEL (UNFRACTIONATED): Heparin Unfractionated: 2 IU/mL — ABNORMAL HIGH (ref 0.30–0.70)

## 2016-06-09 LAB — APTT: aPTT: 89 seconds — ABNORMAL HIGH (ref 24–37)

## 2016-06-09 MED ORDER — ATENOLOL 25 MG PO TABS
25.0000 mg | ORAL_TABLET | Freq: Two times a day (BID) | ORAL | Status: DC
  Administered 2016-06-09 – 2016-06-12 (×6): 25 mg via ORAL
  Filled 2016-06-09 (×7): qty 1

## 2016-06-09 MED ORDER — PEG-KCL-NACL-NASULF-NA ASC-C 100 G PO SOLR
0.5000 | Freq: Once | ORAL | Status: AC
  Administered 2016-06-10: 100 g via ORAL

## 2016-06-09 MED ORDER — PEG-KCL-NACL-NASULF-NA ASC-C 100 G PO SOLR
0.5000 | Freq: Once | ORAL | Status: AC
  Administered 2016-06-09: 100 g via ORAL
  Filled 2016-06-09: qty 1

## 2016-06-09 MED ORDER — PEG-KCL-NACL-NASULF-NA ASC-C 100 G PO SOLR: 1.0000 | Freq: Once | ORAL | Status: DC

## 2016-06-09 MED ORDER — PANTOPRAZOLE SODIUM 40 MG IV SOLR
40.0000 mg | Freq: Two times a day (BID) | INTRAVENOUS | Status: DC
  Administered 2016-06-09 – 2016-06-11 (×4): 40 mg via INTRAVENOUS
  Filled 2016-06-09 (×5): qty 40

## 2016-06-09 NOTE — Consult Note (Signed)
Consultation  Referring Provider:    Dr. Sloan Leiter  Primary Care Physician:  [name], MD Primary Gastroenterologist:  Dr. Carlean Purl       Reason for Consultation:  Diarrhea            HPI:   [name] Dyer is a 82 y.o. Caucasian female with a history of paroxysmal A. fib on Eliquis, hypertension, hyperlipidemia and chronic diastolic CHF who presented to the ED on 06/06/16 with a history of 2 weeks of diarrhea and new onset of confusion. At time of interview today, the patient explains that she was recently seen in our outpatient clinic on 06/04/2016 and started on Metronidazole for a 2 week history of diarrhea. She expresses that she had a similar episode a year ago, which did resolve after Metronidazole therapy. The patient notes that the night she started taking the antibiotic she became confused, "I woke up and didn't know where the bathroom was in my own house". This apparently continued for the next few days until she was admitted to the ER. Per patient and hospitalist report the patient did have a "black stool" this morning which was liquid in form. The patient is somewhat confused regarding how frequent her stools are,, but says it is "too many to count". She is also unable to tell me if it has improved at all since taking the antibiotic as she "became confused". Associated symptoms included a decrease in appetite, lower abdominal pain better after a bowel movement and occasional nausea.  Per hospitalist patient's Eliquis was stopped on Friday, 06/06/16 and she was placed on heparin which was discharged this morning after her black stool.  Patient denies fever, chills, heartburn, reflux, use of NSAIDs or epigastric pain.  EGD course: While in the ER patient had a CBC notable for leukocytosis of 11,200 with toxic granulation relation and clumped platelets. CT of the abdomen and pelvis showed thickened appearance of the distal colon and rectosigmoid most compatible with colitis. There  is no evidence of bowel obstruction. Patient also had mottled enhancement of the liver compatible with hepatic venous congestion likely secondary to right cardiac dysfunction.  Recent GI procedures: 03/07/10-colonoscopy-performed in [place]:  findings of colon polyps in the descending colon and rectum, descending and sigmoid diverticulosis and large external/internal hemorrhoids.  Past Medical History  Diagnosis Date  . Anemia   . Arrhythmia   . Colon polyps   . Congestive heart failure ([place])   . Hyperlipidemia   . Hypertension   . UTI (lower urinary tract infection)   . Community acquired pneumonia Feb 2013    LLL  . Pleural effusion, right Feb 2013  . Osteoporosis   . SVT (supraventricular tachycardia) ([place]) feb 2013  . Cataract     Past Surgical History  Procedure Laterality Date  . Abdominal hysterectomy  1983  . Appendectomy  1983  . Colonoscopy    . Upper gastrointestinal endoscopy    . Hemiarthroplasty hip  2011    Left femoral neck fracture    Family History  Problem Relation Age of Onset  . Colon polyps Son   . Stroke Father     Social History  Substance Use Topics  . Smoking status: Never Smoker   . Smokeless tobacco: Never Used  . Alcohol Use: No    Prior to Admission medications   Medication Sig Start Date End Date Taking? Authorizing Provider  apixaban (ELIQUIS) 5 MG TABS tablet Take 1 tablet (5 mg total) by mouth 2 (  two) times daily. 06/08/15  Yes [name], MD  atenolol (TENORMIN) 25 MG tablet Take 1 tablet (25 mg total) by mouth 2 (two) times daily. 11/02/15  Yes [name], MD  calcium-vitamin D (OSCAL WITH D) 500-200 MG-UNIT tablet Take 1 tablet by mouth 2 (two) times daily.   Yes Historical Provider, MD  estradiol (ESTRACE) 1 MG tablet Take 0.5 mg by mouth every other day.  07/24/15  Yes Historical Provider, MD  furosemide (LASIX) 20 MG tablet Take 1 tablet (20 mg total) by mouth daily. . 11/02/15  Yes [name], MD  Glucosamine-Chondroit-Vit  C-Mn (GLUCOSAMINE CHONDR 1500 COMPLX PO) Take 1 tablet by mouth 2 (two) times daily.   Yes Historical Provider, MD  loperamide (IMODIUM A-D) 2 MG tablet Take 2 mg by mouth 4 (four) times daily as needed for diarrhea or loose stools. Reported on 06/04/2016   Yes Historical Provider, MD  Melatonin 5 MG CAPS Take 1 capsule by mouth at bedtime.    Yes Historical Provider, MD  metroNIDAZOLE (FLAGYL) 500 MG tablet Take 1 tablet (500 mg total) by mouth 2 (two) times daily. 06/04/16  Yes Jessica D Zehr, PA-C  potassium chloride (K-DUR) 10 MEQ tablet Take 1 tablet (10 mEq total) by mouth daily. 11/02/15  Yes [name], MD  simvastatin (ZOCOR) 20 MG tablet Take 10 mg by mouth daily.    Yes Historical Provider, MD    Current Facility-Administered Medications  Medication Dose Route Frequency Provider Last Rate Last Dose  . 0.9 %  sodium chloride infusion   Intravenous Continuous Jonetta Osgood, MD 50 mL/hr at 06/09/16 0409    . acetaminophen (TYLENOL) tablet 650 mg  650 mg Oral Q6H PRN [name], MD       Or  . acetaminophen (TYLENOL) suppository 650 mg  650 mg Rectal Q6H PRN [name], MD      . atenolol (TENORMIN) tablet 25 mg  25 mg Oral BID Shanker Kristeen Mans, MD      . calcium-vitamin D (OSCAL WITH D) 500-200 MG-UNIT per tablet 1 tablet  1 tablet Oral BID [name], MD   1 tablet at 06/09/16 1045  . HYDROcodone-acetaminophen (NORCO/VICODIN) 5-325 MG per tablet 1-2 tablet  1-2 tablet Oral Q4H PRN Ilene Qua Opyd, MD      . ondansetron (ZOFRAN) tablet 4 mg  4 mg Oral Q6H PRN [name], MD       Or  . ondansetron (ZOFRAN) injection 4 mg  4 mg Intravenous Q6H PRN [name], MD   4 mg at 06/07/16 0407  . simvastatin (ZOCOR) tablet 10 mg  10 mg Oral Daily [name], MD   10 mg at 06/09/16 1046    Allergies as of 06/06/2016 - Review Complete 06/06/2016  Allergen Reaction Noted  . Sulfa antibiotics Hives 12/16/2014     Review of Systems:    Constitutional: Patient reports  feeling cold No weight loss, fever, chills, weakness or fatigue HEENT: Eyes: No visual loss, blurred vision, double vision or yellow sclerae               Ears, Nose, Throat:  No hearing loss, sneezing. Congestion, runny nose or sore throat Skin: No rash or itching Cardiovascular: No chest pain, chest pressure or chest discomfort. No palpitations or  edema Respiratory: No SOB, cough or sputum Gastrointestinal: See HPI and otherwise negative Genitourinary: No dysuria or change in urinary frequency Neurological: No headache, dizziness, syncope, numbness or tingling in the extremities Musculoskeletal: No muscle, back pain, joint pain or stiffness. Hematologic: No anemia, bleeding or bruising Lymphatics: No enlarged lymph nodes or history of splenectomy Psychiatric: Patient reports confusion over the past 5-6 days No history of depression or anxiety Endocrinologic: No reports of sweating, cold or heat intolerance, poyluria or polydypsia Allergies: No history of asthma, hives or eczema    Physical Exam:  Vital signs in last 24 hours: Temp:  [97.6 F (36.4 C)-97.8 F (36.6 C)] 97.7 F (36.5 C) (06/12 0504) Pulse Rate:  [75-92] 75 (06/12 1044) Resp:  [18-19] 18 (06/12 0504) BP: (96-115)/(51-78) 115/51 mmHg (06/12 1044) SpO2:  [98 %-99 %] 99 % (06/12 0504) Weight:  [130 lb (58.968 kg)] 130 lb (58.968 kg) (06/12 0500) Last BM Date: 06/09/16 General:   Pleasant Caucasian female appears to be in NAD, Well developed, Well nourished, alert and cooperative Head:  Normocephalic and atraumatic. Eyes:   PEERL, EOMI. No icterus. Conjunctiva pink. Ears:  Normal auditory acuity. Neck:  Supple Throat: Oral cavity and pharynx without inflammation, swelling or lesion.  Lungs: Respirations even and unlabored. Lungs clear to auscultation bilaterally.   No wheezes, crackles, or rhonchi.  Heart: Normal S1, S2. No MRG. Regular rate and rhythm. No peripheral edema, cyanosis or  pallor. Abdomen:  Soft, mild TTP generalized, nondistended. No rebound or guarding. Normal bowel sounds. No appreciable masses or hepatomegaly.  Rectal:  Not performed.  Msk:  Symmetrical without gross deformities. Peripheral pulses intact.  Extremities:  Without edema, no deformity or joint abnormality. Normal ROM, normal sensation. Neurologic:  Alert and  oriented x4;  grossly normal neurologically.  Skin:   Dry and intact without significant lesions or rashes. Psychiatric: Oriented to person, place and time. Demonstrates good judgement and reason without abnormal affect or behaviors.  LAB RESULTS:  Recent Labs  06/06/16 1716 06/06/16 2017 06/09/16 0540  WBC 11.2*  --  7.1  HGB 13.1  --  11.3*  HCT 39.9  --  34.8*  PLT PLATELET CLUMPS NOTED ON SMEAR, COUNT APPEARS ADEQUATE 193 PLATELET CLUMPS NOTED ON SMEAR, UNABLE TO ESTIMATE   BMET  Recent Labs  06/06/16 1716 06/07/16 0529 06/08/16 0554  NA 133* 136 139  K 3.4* 3.5 3.6  CL 99* 108 112*  CO2 26 22 21*  GLUCOSE 112* 126* 107*  BUN _0 CREATININE 0.92 0.80 0.77  CALCIUM 8.3* 7.5* 8.0*   LFT  Recent Labs  06/07/16 0529  PROT 5.3*  ALBUMIN 2.7*  AST 24  ALT 18  ALKPHOS 53  BILITOT 0.7   PT/INR No results for input(s): LABPROT, INR in the last 72 hours.  STUDIES: Gastrointestinal Panel by PCR , Stool  Status: Finalresult Visible to patient:  Not Released Nextappt: None         Ref Range 2d ago    Campylobacter species NOT DETECTED  NOT DETECTED   Plesimonas shigelloides NOT DETECTED  NOT DETECTED   Salmonella species NOT DETECTED  NOT DETECTED   Yersinia enterocolitica NOT DETECTED  NOT DETECTED   Vibrio species NOT DETECTED  NOT DETECTED   Vibrio cholerae NOT DETECTED  NOT DETECTED   Enteroaggregative E coli (EAEC) NOT DETECTED  NOT DETECTED   Enteropathogenic E coli (EPEC) NOT DETECTED  NOT DETECTED   Enterotoxigenic E coli (ETEC) NOT DETECTED  NOT DETECTED   Shiga  like toxin producing E coli (STEC) NOT DETECTED  NOT DETECTED   E. coli O157 NOT DETECTED  NOT DETECTED   Shigella/Enteroinvasive E coli (EIEC) NOT DETECTED  NOT DETECTED   Cryptosporidium NOT DETECTED  NOT DETECTED   Cyclospora cayetanensis NOT DETECTED  NOT DETECTED   Entamoeba histolytica NOT DETECTED  NOT DETECTED   Giardia lamblia NOT DETECTED  NOT DETECTED   Adenovirus F40/41 NOT DETECTED  NOT DETECTED   Astrovirus NOT DETECTED  NOT DETECTED   Norovirus GI/GII NOT DETECTED  NOT DETECTED   Rotavirus A NOT DETECTED  NOT DETECTED   Sapovirus (I, II, IV, and V) NOT DETECTED  NOT DETECTED        C.Diff: Negative   Impression / Plan:  Impression: 1. Diarrhea:Patient reports 2 weeks of uncontrollable diarrhea, apparently no better after initiation of empiric metronidazole outpatient or addition of Cipro since patient has been admitted; no response to scheduled Lomotil ;CT with question of colitis, see report above for; last colonoscopy in 2011-consider microscopic colitis versus IBD versus other 2. Abnormal CT of abdomen: See above, question of colitis 3. Acute encephalopathy: Resolved some time of admission 4. Paroxysmal A. fib: Hospitalist following, patient is maintained on Delacruz which was Free [place]il on Friday, heparin DC this morning 5. Melena: Reported to have started 06/08/16 afternoon; Hgb 13.1 06/06/16 down to 11.3 this morning; question dilution versus GI bleed  Plan: 1. Continue supportive measures 2. Discussed colonoscopy and EGD with the patient during today's visit. Reviewed risks benefits and limitations. These are scheduled for tomorrow, 06/10/16 with Dr. Havery Moros at 12:15. 3. Monitor CBC with transfusion as necessary 4. Continue scheduled Lomotil for now 5. Agree with holding anticoagulation 6. NPO after midnight, clears rest of today-Moviprep tonight 7. Please await further recommendations from Dr. Havery Moros  Thank you for your kind consultation, we will  continue to follow.  Lavone Nian [facility]  06/09/2016, 11:36 AM

## 2016-06-09 NOTE — Progress Notes (Addendum)
ANTICOAGULATION CONSULT NOTE - Initial Consult  Pharmacy Consult for Heparin Indication: atrial fibrillation, chronic Apixaban  Allergies  Allergen Reactions  . Sulfa Antibiotics Hives    Severe Rash   Patient Measurements: Height: _0  (165.1 cm) Weight: 130 lb (58.968 kg) IBW/kg (Calculated) : 57  Vital Signs: Temp: 97.7 F (36.5 C) (06/12 0504) Temp Source: Oral (06/12 0504) BP: 96/78 mmHg (06/12 0504) Pulse Rate: 85 (06/12 0504)  Labs:  Recent Labs  06/06/16 1716 06/06/16 2017 06/07/16 0529 06/08/16 0554 06/08/16 1029 06/08/16 2010 06/09/16 0540  HGB 13.1  --   --   --   --   --  11.3*  HCT 39.9  --   --   --   --   --  34.8*  PLT PLATELET CLUMPS NOTED ON SMEAR, COUNT APPEARS ADEQUATE 193  --   --   --   --  PLATELET CLUMPS NOTED ON SMEAR, UNABLE TO ESTIMATE  APTT  --   --   --   --  38* 69* 89*  HEPARINUNFRC  --   --   --   --  >2.20*  --  2.00*  CREATININE 0.92  --  0.80 0.77  --   --   --    Estimated Creatinine Clearance: 48.8 mL/min (by C-G formula based on Cr of 0.77).  Medications:  . sodium chloride 50 mL/hr at 06/09/16 0409  . heparin 700 Units/hr (06/08/16 1155)   Assessment: 82 yo F on chronic Eliquis for Afib.  She was admitted with persistent diarrhea and plans for colonoscopy.   Pharmacy is now consulted to change to IV heparin peri-procedure.  Last dose Eliquis @ 2141 on 06/07/16.   Baseline heparin level elevated (>2.2) due to administration of Eliquis.  APTT =38sec at baseline which as at upper end of normal.  Will monitor heparin using aPTT levels until Eliquis no longer influencing heparin levels.    No bleeding noted at baseline.  CBC wnl.  Today: 06/09/2016 APTT 89 sec, Heparin level artificially elevated at 2.0  Goal of Therapy:  APTT 66-102 sec  Heparin level 0.3-0.7 units/ml Monitor platelets by anticoagulation protocol: Yes   Plan:  Continue Heparin 700 units/hr (no bolus) Continue to check 8hr aPTT until HL and aPTT  correlate Daily aPTT, heparin level, CBC while on heparin F/U plans for procedure  Minda Ditto PharmD Pager [phone] 06/09/2016, 8:17 AM   1000: Heparin stopped for Melena, will still continue daily HL, aPTT to monitor Apixaban effect on HL  Minda Ditto PharmD Pager [phone] 06/09/2016, 10:20 AM

## 2016-06-09 NOTE — Progress Notes (Signed)
PROGRESS NOTE        PATIENT DETAILS Name: [name] Dyer Age: 82 y.o. Sex: female Date of Birth: 10-24-1932 Admit Date: 06/06/2016 Admitting Physician [name], MD NWG:[id number],MVHQIO B, MD  Brief Narrative: Patient is a 82 y.o. female with history of PAF on anticoagulation, chronic diastolic heart failure, hypertension who presented with 2 week history of diarrhea and confusion. Had seen GI M.D. a few days prior to this admission, and was empirically started on Flagyl. CT scan on admission was suggestive of colitis involving the distal colon, and rectosigmoid area. Stool GI pathogen panel, and C. difficile PCR are negative. She interestingly had similar symptoms in March 2016, CT scan abdomen and then also showed colitis involving the descending colon and sigmoid colon. Hospital course has been complicated by persistent diarrhea and melena (since 6/11 afternoon). Awaiting GI consultation.   Subjective: Claims she started having melanotic stools since yesterday afternoon, and was lightheaded on exam today. She continues to have loose stools close to 10 episodes in the past 24 hours in spite of scheduled Lomotil.   Assessment/Plan: Principal Problem: Colitis: Initially presumed to be infectious and empirically started on Cipro/Flagyl, however stool GI pathogen panel and C. difficile PCR are negative. Cipro/Flagyl has been discontinued, no response to a trial of scheduled Lomotil. Awaiting GI evaluation for endoscopic evaluation. Her last dose of Eliquis was saturday evening.  Active Problems: Acute encephalopathy: Suspect secondary to dehydration due to significant amount of diarrhea.Resolved with IV fluids.  ? Melena: Claims to have had coffee-ground stools since yesterday afternoon. Also felt lightheaded. Has some mild drop in hemoglobin, hence will discontinue IV heparin and just monitor off anticoagulation. Start PPI, await GI evaluation.  Chronic  diastolic CHF (congestive heart failure): Clinically compensated, decrease IV fluids-follow volume status closely.  Paroxysmal atrial fibrillation: Rate controlled with atenolol, last dose  Her last dose of Eliquis was saturday evening, she was placed on IV heparin-however with melena and lightheadedness with drop in hemoglobin will discontinue all anticoagulation for now. CHADS2VASC of atleast 3  Hypertension: Controlled with atenolol, resume Lasix once diarrhea is better.  Dyslipidemia: Continue Zocor  Hyponatremia: Secondary to dehydration, resolved with IV fluids.  Hypokalemia:Repleted, likely secondary to diarrhea.  DVT Prophylaxis: Anticoagulation held due to possible melena  Code Status: Full code   Family Communication: No family at bedside, but patient is completely awake and alert, and understanding of above noted plan.  Disposition Plan: Remain home once GI evaluation is complete  Antimicrobial agents: IV ciprofloxacin 6/9>>6/11 IV Flagyl 6/9>>6/11  Procedures: None  CONSULTS:  None  Time spent: 25 minutes-Greater than 50% of this time was spent in counseling, explanation of diagnosis, planning of further management, and coordination of care.  MEDICATIONS: Anti-infectives    Start     Dose/Rate Route Frequency Ordered Stop   06/07/16 0800  ciprofloxacin (CIPRO) IVPB 400 mg  Status:  Discontinued     400 mg 200 mL/hr over 60 Minutes Intravenous Every 12 hours 06/06/16 2056 06/08/16 0848   06/07/16 0400  metroNIDAZOLE (FLAGYL) IVPB 500 mg  Status:  Discontinued     500 mg 100 mL/hr over 60 Minutes Intravenous Every 8 hours 06/06/16 2056 06/08/16 0848   06/06/16 1930  ciprofloxacin (CIPRO) tablet 500 mg     500 mg Oral  Once 06/06/16 1924 06/06/16 2018   06/06/16 1930  metroNIDAZOLE (FLAGYL) tablet 500 mg     500 mg Oral  Once 06/06/16 1924 06/06/16 2018      Scheduled Meds: . atenolol  25 mg Oral BID  . calcium-vitamin D  1 tablet Oral BID  .  diphenoxylate-atropine  1 tablet Oral TID  . simvastatin  10 mg Oral Daily   Continuous Infusions: . sodium chloride 50 mL/hr at 06/09/16 0409   PRN Meds:.acetaminophen **OR** acetaminophen, HYDROcodone-acetaminophen, ondansetron **OR** ondansetron (ZOFRAN) IV   PHYSICAL EXAM: Vital signs: Filed Vitals:   06/08/16 2300 06/09/16 0500 06/09/16 0504 06/09/16 1044  BP: 115/61  96/78 115/51  Pulse: 75  85 75  Temp: 97.8 F (36.6 C)  97.7 F (36.5 C)   TempSrc: Oral  Oral   Resp: 19  18   Height:      Weight:  58.968 kg (130 lb)    SpO2: 98%  99%    Filed Weights   06/07/16 2019 06/09/16 0500  Weight: 58.968 kg (130 lb) 58.968 kg (130 lb)   Body mass index is 21.63 kg/(m^2).   Gen Exam: Awake and alert with clear speech. Not in any distress  Neck: Supple, No JVD.  Chest: B/L Clear.   CVS: S1 S2 Regular, no murmurs. Abdomen: soft, BS +, non tender, non distended.  Extremities: no edema, lower extremities warm to touch. Neurologic: Non Focal.   Skin: No Rash or lesions   Wounds: N/A.    LABORATORY DATA: CBC:  Recent Labs Lab 06/02/16 1956 06/06/16 1716 06/06/16 2017 06/09/16 0540  WBC 8.6 11.2*  --  7.1  NEUTROABS  --  9.0*  --   --   HGB 12.9 13.1  --  11.3*  HCT 38.7 39.9  --  34.8*  MCV 89.4 89.1  --  87.7  PLT PLATELET CLUMPS NOTED ON SMEAR, UNABLE TO ESTIMATE PLATELET CLUMPS NOTED ON SMEAR, COUNT APPEARS ADEQUATE 193 PLATELET CLUMPS NOTED ON SMEAR, UNABLE TO ESTIMATE    Basic Metabolic Panel:  Recent Labs Lab 06/02/16 1956 06/06/16 1716 06/06/16 2017 06/07/16 0529 06/08/16 0554  NA 136 133*  --  136 139  K 4.1 3.4*  --  3.5 3.6  CL 106 99*  --  108 112*  CO2 24 26  --  22 21*  GLUCOSE 102* 112*  --  126* 107*  BUN 13 16  --  16 9  CREATININE 0.94 0.92  --  0.80 0.77  CALCIUM 8.7* 8.3*  --  7.5* 8.0*  MG  --   --  1.8  --   --     GFR: Estimated Creatinine Clearance: 48.8 mL/min (by C-G formula based on Cr of 0.77).  Liver Function  Tests:  Recent Labs Lab 06/02/16 1956 06/06/16 1716 06/07/16 0529  AST _0 ALT _1 ALKPHOS 69 65 53  BILITOT 1.2 1.5* 0.7  PROT 6.8 6.7 5.3*  ALBUMIN 3.8 3.6 2.7*   No results for input(s): LIPASE, AMYLASE in the last 168 hours.  Recent Labs Lab 06/06/16 2017  AMMONIA 20    Coagulation Profile: No results for input(s): INR, PROTIME in the last 168 hours.  Cardiac Enzymes: No results for input(s): CKTOTAL, CKMB, CKMBINDEX, TROPONINI in the last 168 hours.  BNP (last 3 results)  Recent Labs  08/27/15 1021  PROBNP 318.0*    HbA1C: No results for input(s): HGBA1C in the last 72 hours.  CBG: No results for input(s): GLUCAP in the last 168 hours.  Lipid Profile: No results for input(s): CHOL, HDL, LDLCALC, TRIG, CHOLHDL, LDLDIRECT in the last 72 hours.  Thyroid Function Tests: No results for input(s): TSH, T4TOTAL, FREET4, T3FREE, THYROIDAB in the last 72 hours.  Anemia Panel: No results for input(s): VITAMINB12, FOLATE, FERRITIN, TIBC, IRON, RETICCTPCT in the last 72 hours.  Urine analysis:    Component Value Date/Time   COLORURINE YELLOW 06/06/2016 1810   APPEARANCEUR CLOUDY* 06/06/2016 1810   LABSPEC 1.015 06/06/2016 1810   PHURINE 5.5 06/06/2016 1810   GLUCOSEU NEGATIVE 06/06/2016 1810   HGBUR MODERATE* 06/06/2016 1810   BILIRUBINUR NEGATIVE 06/06/2016 1810   KETONESUR NEGATIVE 06/06/2016 1810   PROTEINUR NEGATIVE 06/06/2016 1810   UROBILINOGEN 0.2 03/22/2015 1302   NITRITE NEGATIVE 06/06/2016 1810   LEUKOCYTESUR LARGE* 06/06/2016 1810    Sepsis Labs: Lactic Acid, Venous No results found for: LATICACIDVEN  MICROBIOLOGY: Recent Results (from the past 240 hour(s))  Gastrointestinal Panel by PCR , Stool     Status: None   Collection Time: 06/07/16  1:06 AM  Result Value Ref Range Status   Campylobacter species NOT DETECTED NOT DETECTED Final   Plesimonas shigelloides NOT DETECTED NOT DETECTED Final   Salmonella species NOT DETECTED  NOT DETECTED Final   Yersinia enterocolitica NOT DETECTED NOT DETECTED Final   Vibrio species NOT DETECTED NOT DETECTED Final   Vibrio cholerae NOT DETECTED NOT DETECTED Final   Enteroaggregative E coli (EAEC) NOT DETECTED NOT DETECTED Final   Enteropathogenic E coli (EPEC) NOT DETECTED NOT DETECTED Final   Enterotoxigenic E coli (ETEC) NOT DETECTED NOT DETECTED Final   Shiga like toxin producing E coli (STEC) NOT DETECTED NOT DETECTED Final   E. coli O157 NOT DETECTED NOT DETECTED Final   Shigella/Enteroinvasive E coli (EIEC) NOT DETECTED NOT DETECTED Final   Cryptosporidium NOT DETECTED NOT DETECTED Final   Cyclospora cayetanensis NOT DETECTED NOT DETECTED Final   Entamoeba histolytica NOT DETECTED NOT DETECTED Final   Giardia lamblia NOT DETECTED NOT DETECTED Final   Adenovirus F40/41 NOT DETECTED NOT DETECTED Final   Astrovirus NOT DETECTED NOT DETECTED Final   Norovirus GI/GII NOT DETECTED NOT DETECTED Final   Rotavirus A NOT DETECTED NOT DETECTED Final   Sapovirus (I, II, IV, and V) NOT DETECTED NOT DETECTED Final  C difficile quick scan w PCR reflex     Status: None   Collection Time: 06/07/16  3:30 PM  Result Value Ref Range Status   C Diff antigen NEGATIVE NEGATIVE Final   C Diff toxin NEGATIVE NEGATIVE Final   C Diff interpretation Negative for toxigenic C. difficile  Final    RADIOLOGY STUDIES/RESULTS: Ct Abdomen Pelvis W Contrast  06/06/2016  CLINICAL DATA:  81 year old female with abdominal pain and diarrhea. EXAM: CT ABDOMEN AND PELVIS WITH CONTRAST TECHNIQUE: Multidetector CT imaging of the abdomen and pelvis was performed using the standard protocol following bolus administration of intravenous contrast. CONTRAST:  12m ISOVUE-300 IOPAMIDOL (ISOVUE-300) INJECTION 61% COMPARISON:  CT dated 03/22/2015 FINDINGS: The visualized lung bases are clear. There is dilatation of the right cardiac chambers primarily involving the right atrium. Correlation with echocardiogram  recommended. No intra-abdominal free air or free fluid identified There is mottled pattern of contrast enhancement of the liver In the arterial phase compatible with nutmeg liver and secondary to hepatic venous congestion possibly as a result of right cardiac dysfunction. The gallbladder pancreas, spleen, adrenal glands, kidneys, visualized ureters, and urinary bladder appear unremarkable. Hysterectomy. Evaluation of the pelvic structures is limited due to streak artifact  caused by left hip arthroplasty. There is thickened appearance of the distal colon and rectosigmoid most compatible with colitis. Correlation with clinical exam and stool cultures recommended. There is no evidence of bowel obstruction. Appendectomy. There is mild aortoiliac atherosclerotic disease. The origins of the celiac axis, SMA, and IMA appear patent. No portal venous gas identified. The SMV, splenic vein, and main portal veins are patent. There is no adenopathy. The abdominal wall soft tissues appear unremarkable. There is degenerative changes of the spine. Left hip arthroplasty. No acute fracture. IMPRESSION: Findings most compatible with colitis. Correlation with clinical exam and stool cultures recommended. No bowel obstruction. Mottled enhancement pattern of the liver compatible with hepatic venous congestion likely secondary to right cardiac dysfunction. Electronically Signed   By: Anner Crete M.D.   On: 06/06/2016 18:50   Dg Abd Acute W/chest  06/02/2016  CLINICAL DATA:  82 year old female with abdominal pain and diarrhea. EXAM: DG ABDOMEN ACUTE W/ 1V CHEST COMPARISON:  CT dated 03/22/2015 FINDINGS: The lungs are clear. There is no pleural effusion or pneumothorax. The cardiac silhouette is within normal limits. Moderate stool throughout the colon. There is no bowel dilatation or evidence of obstruction. No free air or radiopaque calculi. There is osteopenia with degenerative changes of spine. Left hip arthroplasty. No acute  fracture. IMPRESSION: Negative abdominal radiographs.  No acute cardiopulmonary disease. Electronically Signed   By: Anner Crete M.D.   On: 06/02/2016 23:03     LOS: 2 days   Oren Binet, MD  [facility] Pager:[phone]  If 7PM-7AM, please contact night-coverage [email or web address] Password [facility] 06/09/2016, 10:52 AM

## 2016-06-10 ENCOUNTER — Encounter (HOSPITAL_COMMUNITY): Payer: Self-pay | Admitting: Certified Registered"

## 2016-06-10 ENCOUNTER — Encounter (HOSPITAL_COMMUNITY)
Admission: EM | Disposition: A | Discharge status: Home / Self Care | Payer: Self-pay | Source: Home / Self Care | Attending: Internal Medicine

## 2016-06-10 ENCOUNTER — Inpatient Hospital Stay (HOSPITAL_COMMUNITY): Payer: Medicare Other | Admitting: Certified Registered"

## 2016-06-10 DIAGNOSIS — K635 Polyp of colon: Secondary | ICD-10-CM | POA: Insufficient documentation

## 2016-06-10 HISTORY — PX: ESOPHAGOGASTRODUODENOSCOPY: SHX5428

## 2016-06-10 HISTORY — PX: COLONOSCOPY: SHX5424

## 2016-06-10 LAB — APTT: aPTT: 33 seconds (ref 24–37)

## 2016-06-10 LAB — BASIC METABOLIC PANEL
Anion gap: 8 (ref 5–15)
BUN: 13 mg/dL (ref 6–20)
CO2: 18 mmol/L — ABNORMAL LOW (ref 22–32)
Calcium: 8.6 mg/dL — ABNORMAL LOW (ref 8.9–10.3)
Chloride: 113 mmol/L — ABNORMAL HIGH (ref 101–111)
Creatinine, Ser: 0.83 mg/dL (ref 0.44–1.00)
GFR calc Af Amer: >60 mL/min (ref >60)
GFR calc non Af Amer: >60 mL/min (ref >60)
Glucose, Bld: 88 mg/dL (ref 65–99)
Potassium: 3.9 mmol/L (ref 3.5–5.1)
Sodium: 139 mmol/L (ref 135–145)

## 2016-06-10 LAB — CBC
HCT: 38.6 % (ref 36.0–46.0)
Hemoglobin: 12.3 g/dL (ref 12.0–15.0)
MCH: 28.7 pg (ref 26.0–34.0)
MCHC: 31.9 g/dL (ref 30.0–36.0)
MCV: 90 fL (ref 78.0–100.0)
Platelets: ADEQUATE 10*3/uL (ref 150–400)
RBC: 4.29 MIL/uL (ref 3.87–5.11)
RDW: 14.9 % (ref 11.5–15.5)
WBC: 8.5 10*3/uL (ref 4.0–10.5)

## 2016-06-10 LAB — PROCALCITONIN: Procalcitonin: 0.13 ng/mL

## 2016-06-10 LAB — HEPARIN LEVEL (UNFRACTIONATED): Heparin Unfractionated: 0.35 IU/mL (ref 0.30–0.70)

## 2016-06-10 SURGERY — COLONOSCOPY
Anesthesia: Monitor Anesthesia Care

## 2016-06-10 MED ORDER — PROPOFOL 10 MG/ML IV BOLUS
INTRAVENOUS | Status: AC
  Filled 2016-06-10: qty 40

## 2016-06-10 MED ORDER — LACTATED RINGERS IV SOLN
INTRAVENOUS | Status: DC | PRN
  Administered 2016-06-10: 12:00:00 via INTRAVENOUS

## 2016-06-10 MED ORDER — PROPOFOL 10 MG/ML IV BOLUS
INTRAVENOUS | Status: DC | PRN
  Administered 2016-06-10 (×15): 20 mg via INTRAVENOUS
  Administered 2016-06-10: 10 mg via INTRAVENOUS
  Administered 2016-06-10 (×2): 20 mg via INTRAVENOUS

## 2016-06-10 MED ORDER — MESALAMINE 1.2 G PO TBEC
4.8000 g | DELAYED_RELEASE_TABLET | Freq: Every day | ORAL | Status: DC
  Administered 2016-06-10 – 2016-06-12 (×3): 4.8 g via ORAL
  Filled 2016-06-10 (×4): qty 4

## 2016-06-10 MED ORDER — MENTHOL 3 MG MT LOZG
1.0000 | LOZENGE | OROMUCOSAL | Status: DC | PRN
  Filled 2016-06-10: qty 9

## 2016-06-10 MED ORDER — VANCOMYCIN 50 MG/ML ORAL SOLUTION
125.0000 mg | Freq: Four times a day (QID) | ORAL | Status: DC
  Administered 2016-06-10 – 2016-06-12 (×9): 125 mg via ORAL
  Filled 2016-06-10 (×11): qty 2.5

## 2016-06-10 NOTE — H&P (View-Only) (Signed)
Consultation  Referring Provider:    Dr. Sloan Leiter  Primary Care Physician:  [name], MD Primary Gastroenterologist:  Dr. Carlean Purl       Reason for Consultation:  Diarrhea            HPI:   [name] Dyer is a 82 y.o. Caucasian female with a history of paroxysmal A. fib on Eliquis, hypertension, hyperlipidemia and chronic diastolic CHF who presented to the ED on 06/06/16 with a history of 2 weeks of diarrhea and new onset of confusion. At time of interview today, the patient explains that she was recently seen in our outpatient clinic on 06/04/2016 and started on Metronidazole for a 2 week history of diarrhea. She expresses that she had a similar episode a year ago, which did resolve after Metronidazole therapy. The patient notes that the night she started taking the antibiotic she became confused, "I woke up and didn't know where the bathroom was in my own house". This apparently continued for the next few days until she was admitted to the ER. Per patient and hospitalist report the patient did have a "black stool" this morning which was liquid in form. The patient is somewhat confused regarding how frequent her stools are,, but says it is "too many to count". She is also unable to tell me if it has improved at all since taking the antibiotic as she "became confused". Associated symptoms included a decrease in appetite, lower abdominal pain better after a bowel movement and occasional nausea.  Per hospitalist patient's Eliquis was stopped on Friday, 06/06/16 and she was placed on heparin which was discharged this morning after her black stool.  Patient denies fever, chills, heartburn, reflux, use of NSAIDs or epigastric pain.  EGD course: While in the ER patient had a CBC notable for leukocytosis of 11,200 with toxic granulation relation and clumped platelets. CT of the abdomen and pelvis showed thickened appearance of the distal colon and rectosigmoid most compatible with colitis. There  is no evidence of bowel obstruction. Patient also had mottled enhancement of the liver compatible with hepatic venous congestion likely secondary to right cardiac dysfunction.  Recent GI procedures: 03/07/10-colonoscopy-performed in [place]:  findings of colon polyps in the descending colon and rectum, descending and sigmoid diverticulosis and large external/internal hemorrhoids.  Past Medical History  Diagnosis Date  . Anemia   . Arrhythmia   . Colon polyps   . Congestive heart failure ([place])   . Hyperlipidemia   . Hypertension   . UTI (lower urinary tract infection)   . Community acquired pneumonia Feb 2013    LLL  . Pleural effusion, right Feb 2013  . Osteoporosis   . SVT (supraventricular tachycardia) ([place]) feb 2013  . Cataract     Past Surgical History  Procedure Laterality Date  . Abdominal hysterectomy  1983  . Appendectomy  1983  . Colonoscopy    . Upper gastrointestinal endoscopy    . Hemiarthroplasty hip  2011    Left femoral neck fracture    Family History  Problem Relation Age of Onset  . Colon polyps Son   . Stroke Father     Social History  Substance Use Topics  . Smoking status: Never Smoker   . Smokeless tobacco: Never Used  . Alcohol Use: No    Prior to Admission medications   Medication Sig Start Date End Date Taking? Authorizing Provider  apixaban (ELIQUIS) 5 MG TABS tablet Take 1 tablet (5 mg total) by mouth 2 (  two) times daily. 06/08/15  Yes [name], MD  atenolol (TENORMIN) 25 MG tablet Take 1 tablet (25 mg total) by mouth 2 (two) times daily. 11/02/15  Yes [name], MD  calcium-vitamin D (OSCAL WITH D) 500-200 MG-UNIT tablet Take 1 tablet by mouth 2 (two) times daily.   Yes Historical Provider, MD  estradiol (ESTRACE) 1 MG tablet Take 0.5 mg by mouth every other day.  07/24/15  Yes Historical Provider, MD  furosemide (LASIX) 20 MG tablet Take 1 tablet (20 mg total) by mouth daily. . 11/02/15  Yes [name], MD  Glucosamine-Chondroit-Vit  C-Mn (GLUCOSAMINE CHONDR 1500 COMPLX PO) Take 1 tablet by mouth 2 (two) times daily.   Yes Historical Provider, MD  loperamide (IMODIUM A-D) 2 MG tablet Take 2 mg by mouth 4 (four) times daily as needed for diarrhea or loose stools. Reported on 06/04/2016   Yes Historical Provider, MD  Melatonin 5 MG CAPS Take 1 capsule by mouth at bedtime.    Yes Historical Provider, MD  metroNIDAZOLE (FLAGYL) 500 MG tablet Take 1 tablet (500 mg total) by mouth 2 (two) times daily. 06/04/16  Yes Jessica D Zehr, PA-C  potassium chloride (K-DUR) 10 MEQ tablet Take 1 tablet (10 mEq total) by mouth daily. 11/02/15  Yes [name], MD  simvastatin (ZOCOR) 20 MG tablet Take 10 mg by mouth daily.    Yes Historical Provider, MD    Current Facility-Administered Medications  Medication Dose Route Frequency Provider Last Rate Last Dose  . 0.9 %  sodium chloride infusion   Intravenous Continuous Jonetta Osgood, MD 50 mL/hr at 06/09/16 0409    . acetaminophen (TYLENOL) tablet 650 mg  650 mg Oral Q6H PRN [name], MD       Or  . acetaminophen (TYLENOL) suppository 650 mg  650 mg Rectal Q6H PRN [name], MD      . atenolol (TENORMIN) tablet 25 mg  25 mg Oral BID Shanker Kristeen Mans, MD      . calcium-vitamin D (OSCAL WITH D) 500-200 MG-UNIT per tablet 1 tablet  1 tablet Oral BID [name], MD   1 tablet at 06/09/16 1045  . HYDROcodone-acetaminophen (NORCO/VICODIN) 5-325 MG per tablet 1-2 tablet  1-2 tablet Oral Q4H PRN Ilene Qua Opyd, MD      . ondansetron (ZOFRAN) tablet 4 mg  4 mg Oral Q6H PRN [name], MD       Or  . ondansetron (ZOFRAN) injection 4 mg  4 mg Intravenous Q6H PRN [name], MD   4 mg at 06/07/16 0407  . simvastatin (ZOCOR) tablet 10 mg  10 mg Oral Daily [name], MD   10 mg at 06/09/16 1046    Allergies as of 06/06/2016 - Review Complete 06/06/2016  Allergen Reaction Noted  . Sulfa antibiotics Hives 12/16/2014     Review of Systems:    Constitutional: Patient reports  feeling cold No weight loss, fever, chills, weakness or fatigue HEENT: Eyes: No visual loss, blurred vision, double vision or yellow sclerae               Ears, Nose, Throat:  No hearing loss, sneezing. Congestion, runny nose or sore throat Skin: No rash or itching Cardiovascular: No chest pain, chest pressure or chest discomfort. No palpitations or  edema Respiratory: No SOB, cough or sputum Gastrointestinal: See HPI and otherwise negative Genitourinary: No dysuria or change in urinary frequency Neurological: No headache, dizziness, syncope, numbness or tingling in the extremities Musculoskeletal: No muscle, back pain, joint pain or stiffness. Hematologic: No anemia, bleeding or bruising Lymphatics: No enlarged lymph nodes or history of splenectomy Psychiatric: Patient reports confusion over the past 5-6 days No history of depression or anxiety Endocrinologic: No reports of sweating, cold or heat intolerance, poyluria or polydypsia Allergies: No history of asthma, hives or eczema    Physical Exam:  Vital signs in last 24 hours: Temp:  [97.6 F (36.4 C)-97.8 F (36.6 C)] 97.7 F (36.5 C) (06/12 0504) Pulse Rate:  [75-92] 75 (06/12 1044) Resp:  [18-19] 18 (06/12 0504) BP: (96-115)/(51-78) 115/51 mmHg (06/12 1044) SpO2:  [98 %-99 %] 99 % (06/12 0504) Weight:  [130 lb (58.968 kg)] 130 lb (58.968 kg) (06/12 0500) Last BM Date: 06/09/16 General:   Pleasant Caucasian female appears to be in NAD, Well developed, Well nourished, alert and cooperative Head:  Normocephalic and atraumatic. Eyes:   PEERL, EOMI. No icterus. Conjunctiva pink. Ears:  Normal auditory acuity. Neck:  Supple Throat: Oral cavity and pharynx without inflammation, swelling or lesion.  Lungs: Respirations even and unlabored. Lungs clear to auscultation bilaterally.   No wheezes, crackles, or rhonchi.  Heart: Normal S1, S2. No MRG. Regular rate and rhythm. No peripheral edema, cyanosis or  pallor. Abdomen:  Soft, mild TTP generalized, nondistended. No rebound or guarding. Normal bowel sounds. No appreciable masses or hepatomegaly.  Rectal:  Not performed.  Msk:  Symmetrical without gross deformities. Peripheral pulses intact.  Extremities:  Without edema, no deformity or joint abnormality. Normal ROM, normal sensation. Neurologic:  Alert and  oriented x4;  grossly normal neurologically.  Skin:   Dry and intact without significant lesions or rashes. Psychiatric: Oriented to person, place and time. Demonstrates good judgement and reason without abnormal affect or behaviors.  LAB RESULTS:  Recent Labs  06/06/16 1716 06/06/16 2017 06/09/16 0540  WBC 11.2*  --  7.1  HGB 13.1  --  11.3*  HCT 39.9  --  34.8*  PLT PLATELET CLUMPS NOTED ON SMEAR, COUNT APPEARS ADEQUATE 193 PLATELET CLUMPS NOTED ON SMEAR, UNABLE TO ESTIMATE   BMET  Recent Labs  06/06/16 1716 06/07/16 0529 06/08/16 0554  NA 133* 136 139  K 3.4* 3.5 3.6  CL 99* 108 112*  CO2 26 22 21*  GLUCOSE 112* 126* 107*  BUN _0 CREATININE 0.92 0.80 0.77  CALCIUM 8.3* 7.5* 8.0*   LFT  Recent Labs  06/07/16 0529  PROT 5.3*  ALBUMIN 2.7*  AST 24  ALT 18  ALKPHOS 53  BILITOT 0.7   PT/INR No results for input(s): LABPROT, INR in the last 72 hours.  STUDIES: Gastrointestinal Panel by PCR , Stool  Status: Finalresult Visible to patient:  Not Released Nextappt: None         Ref Range 2d ago    Campylobacter species NOT DETECTED  NOT DETECTED   Plesimonas shigelloides NOT DETECTED  NOT DETECTED   Salmonella species NOT DETECTED  NOT DETECTED   Yersinia enterocolitica NOT DETECTED  NOT DETECTED   Vibrio species NOT DETECTED  NOT DETECTED   Vibrio cholerae NOT DETECTED  NOT DETECTED   Enteroaggregative E coli (EAEC) NOT DETECTED  NOT DETECTED   Enteropathogenic E coli (EPEC) NOT DETECTED  NOT DETECTED   Enterotoxigenic E coli (ETEC) NOT DETECTED  NOT DETECTED   Shiga  like toxin producing E coli (STEC) NOT DETECTED  NOT DETECTED   E. coli O157 NOT DETECTED  NOT DETECTED   Shigella/Enteroinvasive E coli (EIEC) NOT DETECTED  NOT DETECTED   Cryptosporidium NOT DETECTED  NOT DETECTED   Cyclospora cayetanensis NOT DETECTED  NOT DETECTED   Entamoeba histolytica NOT DETECTED  NOT DETECTED   Giardia lamblia NOT DETECTED  NOT DETECTED   Adenovirus F40/41 NOT DETECTED  NOT DETECTED   Astrovirus NOT DETECTED  NOT DETECTED   Norovirus GI/GII NOT DETECTED  NOT DETECTED   Rotavirus A NOT DETECTED  NOT DETECTED   Sapovirus (I, II, IV, and V) NOT DETECTED  NOT DETECTED        C.Diff: Negative   Impression / Plan:  Impression: 1. Diarrhea:Patient reports 2 weeks of uncontrollable diarrhea, apparently no better after initiation of empiric metronidazole outpatient or addition of Cipro since patient has been admitted; no response to scheduled Lomotil ;CT with question of colitis, see report above for; last colonoscopy in 2011-consider microscopic colitis versus IBD versus other 2. Abnormal CT of abdomen: See above, question of colitis 3. Acute encephalopathy: Resolved some time of admission 4. Paroxysmal A. fib: Hospitalist following, patient is maintained on Delacruz which was [place] on Friday, heparin DC this morning 5. Melena: Reported to have started 06/08/16 afternoon; Hgb 13.1 06/06/16 down to 11.3 this morning; question dilution versus GI bleed  Plan: 1. Continue supportive measures 2. Discussed colonoscopy and EGD with the patient during today's visit. Reviewed risks benefits and limitations. These are scheduled for tomorrow, 06/10/16 with Dr. Havery Moros at 12:15. 3. Monitor CBC with transfusion as necessary 4. Continue scheduled Lomotil for now 5. Agree with holding anticoagulation 6. NPO after midnight, clears rest of today-Moviprep tonight 7. Please await further recommendations from Dr. Havery Moros  Thank you for your kind consultation, we will  continue to follow.  Lavone Nian [facility]  06/09/2016, 11:36 AM

## 2016-06-10 NOTE — Interval H&P Note (Signed)
History and Physical Interval Note:  06/10/2016 12:20 PM  [name] Dyer  has presented today for surgery, with the diagnosis of melena, diarrhea  The various methods of treatment have been discussed with the patient and family. After consideration of risks, benefits and other options for treatment, the patient has consented to  Procedure(s): COLONOSCOPY (N/A) ESOPHAGOGASTRODUODENOSCOPY (EGD) (N/A) as a surgical intervention .  The patient's history has been reviewed, patient examined, no change in status, stable for surgery.  I have reviewed the patient's chart and labs.  Questions were answered to the patient's satisfaction.     Renelda Loma Bradie Sangiovanni

## 2016-06-10 NOTE — Op Note (Signed)
[facility] Patient Name: [name] Dyer Procedure Date: 06/10/2016 MRN: [id number] Attending MD: Carlota Raspberry. Havery Moros , MD Date of Birth: 06-23-1932 CSN: [id number] Age: 82 Admit Type: Inpatient Procedure:                Colonoscopy Indications:              Clinically significant diarrhea of unexplained                            origin Providers:                Carlota Raspberry. Havery Moros, MD, Cleda Daub, RN, [facility] Tech, Technician, Glenis Smoker, CRNA Referring MD:              Medicines:                Monitored Anesthesia Care Complications:            No immediate complications. Estimated blood loss:                            Minimal. Estimated Blood Loss:     Estimated blood loss was minimal. Procedure:                Pre-Anesthesia Assessment:                           - Prior to the procedure, a History and Physical                            was performed, and patient medications and                            allergies were reviewed. The patient's tolerance of                            previous anesthesia was also reviewed. The risks                            and benefits of the procedure and the sedation                            options and risks were discussed with the patient.                            All questions were answered, and informed consent                            was obtained. Prior Anticoagulants: The patient has                            taken heparin, last dose was 1 day prior to                            procedure.  ASA Grade Assessment: III - A patient                            with severe systemic disease. After reviewing the                            risks and benefits, the patient was deemed in                            satisfactory condition to undergo the procedure.                           After obtaining informed consent, the colonoscope                            was passed under  direct vision. Throughout the                            procedure, the patient's blood pressure, pulse, and                            oxygen saturations were monitored continuously. The                            EC-[id number] ([id number]) scope was introduced through                            the anus and advanced to the the terminal ileum,                            with identification of the appendiceal orifice and                            IC valve. The colonoscopy was technically difficult                            and complex due to a tortuous colon. The patient                            tolerated the procedure well. The quality of the                            bowel preparation was adequate. The terminal ileum,                            ileocecal valve, appendiceal orifice, and rectum                            were photographed. Scope In: 12:36:07 PM Scope Out: 1:08:01 PM Scope Withdrawal Time: 0 hours 22 minutes 18 seconds  Total Procedure Duration: 0 hours 31 minutes 54 seconds  Findings:      The perianal exam findings include non-thrombosed external hemorrhoids.      A 7 mm polyp was found  in the mid transverse colon. The polyp was       pedunculated. The polyp was removed with a hot snare. Resection and       retrieval were complete. To prevent bleeding after the polypectomy, one       hemostatic clip was successfully placed. There was no bleeding during,       or at the end, of the procedure.      A 7 mm polyp was found in the ascending colon. The polyp was sessile.       The polyp was removed with a cold snare. Resection and retrieval were       complete. To prevent bleeding after the polypectomy, one hemostatic clip       was successfully placed. There was no bleeding during, or at the end, of       the procedure.      The terminal ileum appeared normal.      A few medium-mouthed diverticula were found in the left colon.      Patchy mild inflammation characterized by  loss of vascularity and a few       aphthous ulcerations was found in the rectum, in the sigmoid colon and       in the descending colon. Biopsies were taken with a cold forceps for       histology.      Non-bleeding internal hemorrhoids were found during retroflexion.      The exam was otherwise without abnormality. Biopsies taken of the right       and transverse colon as well to assess for microscopic colitis. Impression:               - Non-thrombosed external hemorrhoids found on                            perianal exam.                           - One 7 mm polyp in the mid transverse colon,                            removed with a hot snare. Resected and retrieved.                            Clip was placed.                           - One 7 mm polyp in the ascending colon, removed                            with a cold snare. Resected and retrieved. Clip was                            placed.                           - The examined portion of the ileum was normal.                           - Diverticulosis in the left colon.                           -  Patchy mild inflammation was found in the rectum,                            in the sigmoid colon and in the descending colon.                            Biopsied.                           - Non-bleeding internal hemorrhoids.                           - The examination was otherwise normal. Random                            biopsies taken of right and transverse colon.                           Overall, mild nonspecific inflammatory changes of                            the left colon (loss of vascularity and a few                            apthous ulcerations), could be postinfectious vs.                            mild C diff, overt IBD seems less likely but                            possible, biopsies also taken to rule out                            microscopic colitis. Level if inflammation (mild)                             does not correlate with patient's severe symptoms,                            unless microscopic colitis noted.                           Hemostasis clips placed post polypectomy to prevent                            bleeding given patient's need for anticoagulation. Moderate Sedation:      N/A- Per Anesthesia Care Recommendation:           - Return patient to hospital ward for ongoing care.                           - Resume previous diet.                           - Continue present medications. Hold off on  anticoagulation today given polyps removed,                            prophylactic clips placed if absolutely needed to                            resume today                           - Await pathology results, hopefully returns within                            24-48 hours.                           - Start oral vancomycin given conflicting C diff                            stool studies and mild inflammatory changes noted                           - Trial of Lialda 4.8gm daily while biopsies pending                           - GI service will continue to follow Procedure Code(s):        --- Professional ---                           [phone], Colonoscopy, flexible; with removal of                            tumor(s), polyp(s), or other lesion(s) by snare                            technique                           45380, 59, Colonoscopy, flexible; with biopsy,                            single or multiple Diagnosis Code(s):        --- Professional ---                           D12.3, Benign neoplasm of transverse colon (hepatic                            flexure or splenic flexure)                           D12.2, Benign neoplasm of ascending colon                           K64.4, Residual hemorrhoidal skin tags                           K64.8,  Other hemorrhoids                           K62.89, Other specified diseases of anus and rectum                            K52.9, Noninfective gastroenteritis and colitis,                            unspecified                           R19.7, Diarrhea, unspecified                           K57.30, Diverticulosis of large intestine without                            perforation or abscess without bleeding CPT copyright 2016 American [employer]. All rights reserved. The codes documented in this report are preliminary and upon coder review may  be revised to meet current compliance requirements. Remo Lipps P. Kashif Pooler, MD 06/10/2016 1:25:16 PM This report has been signed electronically. Number of Addenda: 0

## 2016-06-10 NOTE — Anesthesia Postprocedure Evaluation (Signed)
Anesthesia Post Note  Patient: [name] Dyer  Procedure(s) Performed: Procedure(s) (LRB): COLONOSCOPY (N/A) ESOPHAGOGASTRODUODENOSCOPY (EGD) (N/A)  Patient location during evaluation: PACU Anesthesia Type: MAC Level of consciousness: awake and alert Pain management: pain level controlled Vital Signs Assessment: post-procedure vital signs reviewed and stable Respiratory status: spontaneous breathing, nonlabored ventilation, respiratory function stable and patient connected to nasal cannula oxygen Cardiovascular status: stable and blood pressure returned to baseline Anesthetic complications: no    Last Vitals:  Filed Vitals:   06/10/16 1340 06/10/16 1402  BP: 133/58 143/96  Pulse: 61 80  Temp:  36.3 C  Resp: 15 16    Last Pain:  Filed Vitals:   06/10/16 1403  PainSc: 0-No pain                 Joal Eakle DAVID

## 2016-06-10 NOTE — Progress Notes (Addendum)
PROGRESS NOTE                                                                                                                                                                                                             Patient Demographics:    [name] Dyer, is a 82 y.o. female, DOB - 1932/04/01, PPI:[id number]  Admit date - 06/06/2016   Admitting Physician [name], MD  Outpatient Primary MD for the patient is [name], MD  LOS - 3  Chief Complaint  Patient presents with  . Diarrhea  . Medication Reaction       Brief Narrative   Patient is a 82 y.o. female with history of PAF on anticoagulation, chronic diastolic heart failure, hypertension who presented with 2 week history of diarrhea and confusion. Had seen GI M.D. a few days prior to this admission, and was empirically started on Flagyl. CT scan on admission was suggestive of colitis involving the distal colon, and rectosigmoid area. Stool GI pathogen panel, and C. difficile PCR are negative. She interestingly had similar symptoms in March 2016, CT scan abdomen and then also showed colitis involving the descending colon and sigmoid colon. Hospital course has been complicated by persistent diarrhea and melena (since 6/11 afternoon). GI consultanted and scheduled EGD and colonoscopy today.    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    Assessment  & Plan :   Principal Problem: Colitis: Initially presumed to be infectious and empirically started on Cipro/Flagyl, however stool GI pathogen panel and C. difficile PCR are negative. Cipro/Flagyl has been discontinued, no response to a trial of scheduled Lomotil. GI preforming EGD and colonoscopy today. Her last dose of Eliquis was 6/9. Will continue to monitor symptoms and endoscopic results.  Addendum: Initial  GI pathogen panel was negative-but today-C. difficile toxin is positive. A separate stool sample was negative for C. difficile PCR and toxin. No pseudomembranes seen on colonoscopy, some mild inflammation seen. Gastroenterology has started patient on mesalamine and oral vancomycin. Would plan on at least 10 days of oral vancomycin.  Active Problems: Acute encephalopathy: Suspect secondary to dehydration due to significant amount of diarrhea.Resolved with IV fluids.  ? Melena: Claims to have had coffee-ground stools since 6/11. Yesterday felt lightheaded  with mild drop in hemoglobin, discontinued IV heparin and monitoring off anticoagulation. Started PPI. Today, GI is preforming endoscopy and will await results. Continue to monitor hemoglobin.   Thrombocytopenia: Suspect secondary to platelet clumping, repeat CBC tomorrow.  Chronic diastolic CHF (congestive heart failure): Clinically compensated, decreased IV fluids- continue to follow volume status closely.  Paroxysmal atrial fibrillation: Rate controlled with atenolol, last dose 6/12. Her last dose of Eliquis was 6/9 ,she was placed on IV heparin-however with melena and lightheadedness with drop in hemoglobin was discontinued on all anticoagulation for now. CHADS2VASC of atleast 3  Addendum: Patient is status post polypectomy 2-will likely be able to resume anticoagulation tomorrow if hemoglobin is stable.  Hypertension: Controlled with atenolol, resume Lasix once diarrhea is better.  Dyslipidemia: Continue Zocor  Hyponatremia: Secondary to dehydration, resolved with IV fluids.  Hypokalemia:Repleted, likely secondary to diarrhea.  Code Status :   Full code  Family Communication  :   None at bedside  Disposition Plan  :   Stay inpatient- duration dependent on GI findings on EGD/colonoscopy  Consults  :   GI  Procedures  :   EGD and Colonoscopy today pending results  DVT Prophylaxis  :   Held due to possible melena  Lab  Results  Component Value Date   PLT  06/10/2016    PLATELET CLUMPS NOTED ON SMEAR, COUNT APPEARS ADEQUATE    Inpatient Medications  Scheduled Meds: . [MAR Hold] atenolol  25 mg Oral BID  . [MAR Hold] calcium-vitamin D  1 tablet Oral BID  . [MAR Hold] pantoprazole (PROTONIX) IV  40 mg Intravenous Q12H  . [MAR Hold] simvastatin  10 mg Oral Daily   Continuous Infusions: . sodium chloride 1,000 mL (06/09/16 2359)   PRN Meds:.[MAR Hold] acetaminophen **OR** [MAR Hold] acetaminophen, [MAR Hold] HYDROcodone-acetaminophen, [MAR Hold] ondansetron **OR** [MAR Hold] ondansetron (ZOFRAN) IV  Antibiotics  :    Anti-infectives    Start     Dose/Rate Route Frequency Ordered Stop   06/07/16 0800  ciprofloxacin (CIPRO) IVPB 400 mg  Status:  Discontinued     400 mg 200 mL/hr over 60 Minutes Intravenous Every 12 hours 06/06/16 2056 06/08/16 0848   06/07/16 0400  metroNIDAZOLE (FLAGYL) IVPB 500 mg  Status:  Discontinued     500 mg 100 mL/hr over 60 Minutes Intravenous Every 8 hours 06/06/16 2056 06/08/16 0848   06/06/16 1930  ciprofloxacin (CIPRO) tablet 500 mg     500 mg Oral  Once 06/06/16 1924 06/06/16 2018   06/06/16 1930  metroNIDAZOLE (FLAGYL) tablet 500 mg     500 mg Oral  Once 06/06/16 1924 06/06/16 2018         Objective:   Filed Vitals:   06/10/16 0500 06/10/16 0945 06/10/16 1139 06/10/16 1321  BP: 131/86  169/87 114/84  Pulse: 73  85   Temp: 97.5 F (36.4 C)  98.1 F (36.7 C)   TempSrc: Oral  Oral   Resp: _0 Height:   _1  (1.651 m)   Weight:  63 kg (138 lb 14.2 oz) 67.586 kg (149 lb)   SpO2: 100%  100% 100%    Wt Readings from Last 3 Encounters:  06/10/16 67.586 kg (149 lb)  06/04/16 59.421 kg (131 lb)  02/29/16 59.784 kg (131 lb 12.8 oz)     Intake/Output Summary (Last 24 hours) at 06/10/16 1328 Last data filed at 06/10/16 1304  Gross per 24 hour  Intake   2052 ml  Output      0 ml  Net   2052 ml     Physical Exam  Awake Alert, Oriented X 3, No  new F.N deficits, Normal affect [place].AT,PERRAL Supple Neck,No JVD, No cervical lymphadenopathy appriciated.  Symmetrical Chest wall movement, Good air movement bilaterally, CTAB RRR,No Gallops,Rubs or new Murmurs, No Parasternal Heave +ve B.Sounds, Abd Soft, diffuse mild abdominal  tenderness, No organomegaly appriciated, No rebound - guarding or rigidity. No Cyanosis, Clubbing or edema , No new Rash or bruise     Data Review:    CBC  Recent Labs Lab 06/06/16 1716 06/06/16 2017 06/09/16 0540 06/09/16 1552 06/10/16 0655  WBC 11.2*  --  7.1 7.4 8.5  HGB 13.1  --  11.3* 10.8* 12.3  HCT 39.9  --  34.8* 33.6* 38.6  PLT PLATELET CLUMPS NOTED ON SMEAR, COUNT APPEARS ADEQUATE 193 PLATELET CLUMPS NOTED ON SMEAR, UNABLE TO ESTIMATE 60* PLATELET CLUMPS NOTED ON SMEAR, COUNT APPEARS ADEQUATE  MCV 89.1  --  87.7 88.2 90.0  MCH 29.2  --  28.5 28.3 28.7  MCHC 32.8  --  32.5 32.1 31.9  RDW 14.0  --  14.4 14.6 14.9  LYMPHSABS 1.3  --   --   --   --   MONOABS 0.8  --   --   --   --   EOSABS 0.1  --   --   --   --   BASOSABS 0.0  --   --   --   --     Chemistries   Recent Labs Lab 06/06/16 1716 06/06/16 2017 06/07/16 0529 06/08/16 0554 06/10/16 0655  NA 133*  --  136 139 139  K 3.4*  --  3.5 3.6 3.9  CL 99*  --  108 112* 113*  CO2 26  --  22 21* 18*  GLUCOSE 112*  --  126* 107* 88  BUN 16  --  _0 CREATININE 0.92  --  0.80 0.77 0.83  CALCIUM 8.3*  --  7.5* 8.0* 8.6*  MG  --  1.8  --   --   --   AST 27  --  24  --   --   ALT 23  --  18  --   --   ALKPHOS 65  --  53  --   --   BILITOT 1.5*  --  0.7  --   --    ------------------------------------------------------------------------------------------------------------------ No results for input(s): CHOL, HDL, LDLCALC, TRIG, CHOLHDL, LDLDIRECT in the last 72 hours.  No results found for: HGBA1C ------------------------------------------------------------------------------------------------------------------ No results for  input(s): TSH, T4TOTAL, T3FREE, THYROIDAB in the last 72 hours.  Invalid input(s): FREET3 ------------------------------------------------------------------------------------------------------------------ No results for input(s): VITAMINB12, FOLATE, FERRITIN, TIBC, IRON, RETICCTPCT in the last 72 hours.  Coagulation profile No results for input(s): INR, PROTIME in the last 168 hours.  No results for input(s): DDIMER in the last 72 hours.  Cardiac Enzymes No results for input(s): CKMB, TROPONINI, MYOGLOBIN in the last 168 hours.  Invalid input(s): CK ------------------------------------------------------------------------------------------------------------------    Component Value Date/Time   BNP 201.3* 03/16/2015 1717    Micro Results Recent Results (from the past 240 hour(s))  Gastrointestinal Panel by PCR , Stool     Status: None   Collection Time: 06/07/16  1:06 AM  Result Value Ref Range Status   Campylobacter species NOT DETECTED NOT DETECTED Final   Plesimonas shigelloides NOT DETECTED NOT DETECTED Final   Salmonella species NOT DETECTED  NOT DETECTED Final   Yersinia enterocolitica NOT DETECTED NOT DETECTED Final   Vibrio species NOT DETECTED NOT DETECTED Final   Vibrio cholerae NOT DETECTED NOT DETECTED Final   Enteroaggregative E coli (EAEC) NOT DETECTED NOT DETECTED Final   Enteropathogenic E coli (EPEC) NOT DETECTED NOT DETECTED Final   Enterotoxigenic E coli (ETEC) NOT DETECTED NOT DETECTED Final   Shiga like toxin producing E coli (STEC) NOT DETECTED NOT DETECTED Final   E. coli O157 NOT DETECTED NOT DETECTED Final   Shigella/Enteroinvasive E coli (EIEC) NOT DETECTED NOT DETECTED Final   Cryptosporidium NOT DETECTED NOT DETECTED Final   Cyclospora cayetanensis NOT DETECTED NOT DETECTED Final   Entamoeba histolytica NOT DETECTED NOT DETECTED Final   Giardia lamblia NOT DETECTED NOT DETECTED Final   Adenovirus F40/41 NOT DETECTED NOT DETECTED Final   Astrovirus  NOT DETECTED NOT DETECTED Final   Norovirus GI/GII NOT DETECTED NOT DETECTED Final   Rotavirus A NOT DETECTED NOT DETECTED Final   Sapovirus (I, II, IV, and V) NOT DETECTED NOT DETECTED Final  C difficile quick scan w PCR reflex     Status: None   Collection Time: 06/07/16  3:30 PM  Result Value Ref Range Status   C Diff antigen NEGATIVE NEGATIVE Final   C Diff toxin NEGATIVE NEGATIVE Final   C Diff interpretation Negative for toxigenic C. difficile  Final    Radiology Reports Ct Abdomen Pelvis W Contrast  06/06/2016  CLINICAL DATA:  82 year old female with abdominal pain and diarrhea. EXAM: CT ABDOMEN AND PELVIS WITH CONTRAST TECHNIQUE: Multidetector CT imaging of the abdomen and pelvis was performed using the standard protocol following bolus administration of intravenous contrast. CONTRAST:  131m ISOVUE-300 IOPAMIDOL (ISOVUE-300) INJECTION 61% COMPARISON:  CT dated 03/22/2015 FINDINGS: The visualized lung bases are clear. There is dilatation of the right cardiac chambers primarily involving the right atrium. Correlation with echocardiogram recommended. No intra-abdominal free air or free fluid identified There is mottled pattern of contrast enhancement of the liver In the arterial phase compatible with nutmeg liver and secondary to hepatic venous congestion possibly as a result of right cardiac dysfunction. The gallbladder pancreas, spleen, adrenal glands, kidneys, visualized ureters, and urinary bladder appear unremarkable. Hysterectomy. Evaluation of the pelvic structures is limited due to streak artifact caused by left hip arthroplasty. There is thickened appearance of the distal colon and rectosigmoid most compatible with colitis. Correlation with clinical exam and stool cultures recommended. There is no evidence of bowel obstruction. Appendectomy. There is mild aortoiliac atherosclerotic disease. The origins of the celiac axis, SMA, and IMA appear patent. No portal venous gas identified. The  SMV, splenic vein, and main portal veins are patent. There is no adenopathy. The abdominal wall soft tissues appear unremarkable. There is degenerative changes of the spine. Left hip arthroplasty. No acute fracture. IMPRESSION: Findings most compatible with colitis. Correlation with clinical exam and stool cultures recommended. No bowel obstruction. Mottled enhancement pattern of the liver compatible with hepatic venous congestion likely secondary to right cardiac dysfunction. Electronically Signed   By: AAnner CreteM.D.   On: 06/06/2016 18:50   Dg Abd Acute W/chest  06/02/2016  CLINICAL DATA:  82year old female with abdominal pain and diarrhea. EXAM: DG ABDOMEN ACUTE W/ 1V CHEST COMPARISON:  CT dated 03/22/2015 FINDINGS: The lungs are clear. There is no pleural effusion or pneumothorax. The cardiac silhouette is within normal limits. Moderate stool throughout the colon. There is no bowel dilatation or evidence of obstruction. No free air or  radiopaque calculi. There is osteopenia with degenerative changes of spine. Left hip arthroplasty. No acute fracture. IMPRESSION: Negative abdominal radiographs.  No acute cardiopulmonary disease. Electronically Signed   By: Anner Crete M.D.   On: 06/02/2016 23:03    Time Spent in minutes  30   Grayland Ormond PA-S on 06/10/2016 at 1:28 PM   Attending MD note  Patient was seen, examined,treatment plan was discussed with the PA-S.  I have personally reviewed the clinical findings, lab, imaging studies and management of this patient in detail. I agree with the documentation, as recorded by the PA-S.   Patient is doing well, she just came back from her colonoscopy which did show some mild inflammation on the left side. Her initial GI pathogen panel was negative, but today C. difficile toxin has come back positive. A separate stool sample for C. difficile PCR and toxin was negative. Has started  mesalamine and oral vancomycin  On Exam: Gen. exam: Awake,  alert, not in any distress Chest: Good air entry bilaterally, no rhonchi or rales CVS: S1-S2 regular, no murmurs Abdomen: Soft, nontender and nondistended Neurology: Non-focal Skin: No rash or lesions  Plan Plan 10 days of oral vancomycin Continue mesalamine ? Resume anticoagulation tomorrow if no further melena  Rest as above  [facility]

## 2016-06-10 NOTE — Op Note (Signed)
[facility] Patient Name: [name] Dyer Procedure Date: 06/10/2016 MRN: [id number] Attending MD: Carlota Raspberry. Havery Moros , MD Date of Birth: 01-23-1932 CSN: [id number] Age: 82 Admit Type: Inpatient Procedure:                Upper GI endoscopy Indications:              Diarrhea, report of "black stools" Providers:                Carlota Raspberry. Havery Moros, MD, Cleda Daub, RN, [facility] Tech, Technician, Glenis Smoker, CRNA Referring MD:              Medicines:                Monitored Anesthesia Care Complications:            No immediate complications. Estimated blood loss:                            Minimal. Estimated Blood Loss:     Estimated blood loss was minimal. Procedure:                Pre-Anesthesia Assessment:                           - Prior to the procedure, a History and Physical                            was performed, and patient medications and                            allergies were reviewed. The patient's tolerance of                            previous anesthesia was also reviewed. The risks                            and benefits of the procedure and the sedation                            options and risks were discussed with the patient.                            All questions were answered, and informed consent                            was obtained. Prior Anticoagulants: The patient has                            taken heparin, last dose was 1 day prior to                            procedure. ASA Grade Assessment: III - A patient  with severe systemic disease. After reviewing the                            risks and benefits, the patient was deemed in                            satisfactory condition to undergo the procedure.                           - Prior to the procedure, a History and Physical                            was performed, and patient medications and            allergies were reviewed. The patient's tolerance of                            previous anesthesia was also reviewed. The risks                            and benefits of the procedure and the sedation                            options and risks were discussed with the patient.                            All questions were answered, and informed consent                            was obtained. Prior Anticoagulants: The patient has                            taken heparin, last dose was 1 day prior to                            procedure. ASA Grade Assessment: III - A patient                            with severe systemic disease. After reviewing the                            risks and benefits, the patient was deemed in                            satisfactory condition to undergo the procedure.                           After obtaining informed consent, the endoscope was                            passed under direct vision. Throughout the                            procedure,  the patient's blood pressure, pulse, and                            oxygen saturations were monitored continuously. The                            [id number] ([id number]) scope was introduced through the                            mouth, and advanced to the second part of duodenum.                            The upper GI endoscopy was accomplished without                            difficulty. The patient tolerated the procedure                            well. Scope In: Scope Out: Findings:      Esophagogastric landmarks were identified: the Z-line was found at 40       cm, the gastroesophageal junction was found at 40 cm and the upper       extent of the gastric folds was found at 40 cm from the incisors.      The exam of the esophagus was otherwise normal.      The entire examined stomach was normal.      The duodenal bulb and second portion of the duodenum were normal.       Biopsies for histology were  taken with a cold forceps for evaluation of       celiac disease. Impression:               - Esophagogastric landmarks identified.                           - Normal stomach.                           - Normal duodenal bulb and second portion of the                            duodenum. Biopsied.                           Overall, no overt pathology noted to cause                            diarrhea, nor any pathology noted which could have                            caused dark stools. Moderate Sedation:      N/A- Per Anesthesia Care Recommendation:           - Return patient to hospital ward for ongoing care.                           - Resume previous diet.                           -  Continue present medications.                           - Await pathology results.                           - See colonoscopy report for further                            recommendations. Procedure Code(s):        --- Professional ---                           [phone], Esophagogastroduodenoscopy, flexible,                            transoral; with biopsy, single or multiple Diagnosis Code(s):        --- Professional ---                           R19.7, Diarrhea, unspecified CPT copyright 2016 [employer]. All rights reserved. The codes documented in this report are preliminary and upon coder review may  be revised to meet current compliance requirements. Remo Lipps P. Armbruster, MD 06/10/2016 1:28:52 PM This report has been signed electronically. Number of Addenda: 0

## 2016-06-10 NOTE — Anesthesia Preprocedure Evaluation (Addendum)
Anesthesia Evaluation  Patient identified by MRN, date of birth, ID band Patient awake    Reviewed: Allergy & Precautions, NPO status , [name] Chart, lab work & pertinent test results  Airway Mallampati: I  TM Distance: >3 FB Neck ROM: Full    Dental  (+) Dental Advisory Given, Chipped   Pulmonary    Pulmonary exam normal        Cardiovascular hypertension, Pt. on medications Normal cardiovascular exam+ dysrhythmias      Neuro/Psych    GI/Hepatic   Endo/Other    Renal/GU      Musculoskeletal   Abdominal   Peds  Hematology   Anesthesia Other Findings   Reproductive/Obstetrics                            Anesthesia Physical Anesthesia Plan  ASA: III  Anesthesia Plan: MAC   Post-op Pain Management:    Induction: Intravenous  Airway Management Planned: Simple Face Mask  Additional Equipment:   Intra-op Plan:   Post-operative Plan:   Informed Consent: I have reviewed the patients History and Physical, chart, labs and discussed the procedure including the risks, benefits and alternatives for the proposed anesthesia with the patient or authorized representative who has indicated his/her understanding and acceptance.     Plan Discussed with: CRNA and Surgeon  Anesthesia Plan Comments:         Anesthesia Quick Evaluation

## 2016-06-10 NOTE — Transfer of Care (Signed)
Immediate Anesthesia Transfer of Care Note  Patient: [name] Dyer  Procedure(s) Performed: Procedure(s): COLONOSCOPY (N/A) ESOPHAGOGASTRODUODENOSCOPY (EGD) (N/A)  Patient Location: PACU and Endoscopy Unit  Anesthesia Type:MAC  Level of Consciousness: sedated  Airway & Oxygen Therapy: Patient Spontanous Breathing and Patient connected to nasal cannula oxygen  Post-op Assessment: Report given to RN and Post -op Vital signs reviewed and stable  Post vital signs: Reviewed and stable  Last Vitals:  Filed Vitals:   06/10/16 0500 06/10/16 1139  BP: 131/86 169/87  Pulse: 73 85  Temp: 36.4 C 36.7 C  Resp: 18 21    Last Pain:  Filed Vitals:   06/10/16 1143  PainSc: 0-No pain         Complications: No apparent anesthesia complications

## 2016-06-10 NOTE — Progress Notes (Signed)
Progress Note   Subjective  [name] Dyer is an 82 year old Caucasian female who was admitted to the hospital on 06/06/16 for continued diarrhea. Patient is scheduled for EGD and colonoscopy today for further evaluation of melenic stools seen yesterday and diarrhea. C. difficile PCR did return positive from 06/16/2016 though was negative on recheck in the hospital on 06/07/2016. Today the patient tells me that she did drink all of her prep. She does ask some questions regarding the procedures and denies any new complaints.   Objective   Vital signs in last 24 hours: Temp:  [97.5 F (36.4 C)-97.8 F (36.6 C)] 97.5 F (36.4 C) (06/13 0500) Pulse Rate:  [73-95] 73 (06/13 0500) Resp:  [16-18] 18 (06/13 0500) BP: (114-131)/(51-86) 131/86 mmHg (06/13 0500) SpO2:  [99 %-100 %] 100 % (06/13 0500) Weight:  [138 lb 14.2 oz (63 kg)] 138 lb 14.2 oz (63 kg) (06/13 0945) Last BM Date: 06/09/16 General: [place] female in NAD Heart:  Regular rate and rhythm; no murmurs Lungs: Respirations even and unlabored, lungs CTA bilaterally Abdomen:  Soft, mild generalized TTP and nondistended. Normal bowel sounds. Extremities:  Without edema. Neurologic:  Alert and oriented,  grossly normal neurologically. Psych:  Cooperative. Normal mood and affect.  Intake/Output from previous day: 06/12 0701 - 06/13 0700 In: 1999 [P.O.:707; I.V.:1292] Out: -   Lab Results:  Recent Labs  06/09/16 0540 06/09/16 1552 06/10/16 0655  WBC 7.1 7.4 8.5  HGB 11.3* 10.8* 12.3  HCT 34.8* 33.6* 38.6  PLT PLATELET CLUMPS NOTED ON SMEAR, UNABLE TO ESTIMATE 60* PLATELET CLUMPS NOTED ON SMEAR, COUNT APPEARS ADEQUATE   BMET  Recent Labs  06/08/16 0554 06/10/16 0655  NA 139 139  K 3.6 3.9  CL 112* 113*  CO2 21* 18*  GLUCOSE 107* 88  BUN 9 13  CREATININE 0.77 0.83  CALCIUM 8.0* 8.6*   LFT No results for input(s): PROT, ALBUMIN, AST, ALT, ALKPHOS, BILITOT, BILIDIR, IBILI in the last 72 hours. PT/INR No  results for input(s): LABPROT, INR in the last 72 hours.  Studies/Results: No results found.     Assessment / Plan:   Impression: 1. Diarrhea:Patient reports 2 weeks of uncontrollable diarrhea, apparently no better after initiation of empiric metronidazole outpatient or addition of Cipro since patient has been admitted; no response to scheduled Lomotil ;CT with question of colitis, see report above for; last colonoscopy in recently C. difficile PCR returned positive from 06/06/2016-colonoscopy scheduled today for further evaluation 2. Abnormal CT of abdomen: See above, question of colitis 3. Acute encephalopathy: Resolved some time of admission 4. Paroxysmal A. fib: Hospitalist following, patient is maintained on Eliquis which was DC'd on Friday, heparin DC this morning 5. Melena: Reported to have started 06/08/16 afternoon. No further report reports of melena from 6/12 to 6/13 EGD scheduled today for further evaluation of possible gastritis versus PUD versus other  Plan: 1. Continue supportive measures 2. Patient to remain nothing by mouth until after her colonoscopy today. 3. Place patient on enteric and contact precautions due to positive C. difficile as outpatient on 06/06/2016 4. Colonoscopy and EGD scheduled Dr. Havery Moros today at 12:15. 5. Will discuss above with Dr. Havery Moros, please await any further recommendations after time of procedures today  Thank you for your kind consultation, we will continue to follow.  Principal Problem:   Colitis presumed infectious Active Problems:   Acute encephalopathy   Chronic diastolic CHF (congestive heart failure) (HCC)   Paroxysmal atrial fibrillation (HCC)   Hypertension  Hyperlipidemia   Hepatic congestion   Colitis     LOS: 3 days   Levin Erp  06/10/2016, 10:33 AM

## 2016-06-11 ENCOUNTER — Encounter (HOSPITAL_COMMUNITY): Payer: Self-pay | Admitting: Gastroenterology

## 2016-06-11 DIAGNOSIS — A09 Infectious gastroenteritis and colitis, unspecified: Secondary | ICD-10-CM

## 2016-06-11 DIAGNOSIS — I480 Paroxysmal atrial fibrillation: Secondary | ICD-10-CM

## 2016-06-11 LAB — BASIC METABOLIC PANEL
Anion gap: 7 (ref 5–15)
BUN: 12 mg/dL (ref 6–20)
CO2: 20 mmol/L — ABNORMAL LOW (ref 22–32)
Calcium: 8.3 mg/dL — ABNORMAL LOW (ref 8.9–10.3)
Chloride: 114 mmol/L — ABNORMAL HIGH (ref 101–111)
Creatinine, Ser: 0.86 mg/dL (ref 0.44–1.00)
GFR calc Af Amer: >60 mL/min (ref >60)
GFR calc non Af Amer: >60 mL/min (ref >60)
Glucose, Bld: 95 mg/dL (ref 65–99)
Potassium: 3.7 mmol/L (ref 3.5–5.1)
Sodium: 141 mmol/L (ref 135–145)

## 2016-06-11 LAB — CBC
HCT: 35.3 % — ABNORMAL LOW (ref 36.0–46.0)
Hemoglobin: 11.4 g/dL — ABNORMAL LOW (ref 12.0–15.0)
MCH: 28.9 pg (ref 26.0–34.0)
MCHC: 32.3 g/dL (ref 30.0–36.0)
MCV: 89.6 fL (ref 78.0–100.0)
Platelets: ADEQUATE 10*3/uL (ref 150–400)
RBC: 3.94 MIL/uL (ref 3.87–5.11)
RDW: 14.8 % (ref 11.5–15.5)
WBC: 8.9 10*3/uL (ref 4.0–10.5)

## 2016-06-11 MED ORDER — APIXABAN 5 MG PO TABS
5.0000 mg | ORAL_TABLET | Freq: Two times a day (BID) | ORAL | Status: DC
  Administered 2016-06-11 – 2016-06-12 (×2): 5 mg via ORAL
  Filled 2016-06-11 (×4): qty 1

## 2016-06-11 MED ORDER — OXYMETAZOLINE HCL 0.05 % NA SOLN
1.0000 | Freq: Two times a day (BID) | NASAL | Status: DC
  Administered 2016-06-11 – 2016-06-12 (×3): 1 via NASAL
  Filled 2016-06-11: qty 15

## 2016-06-11 MED ORDER — PANTOPRAZOLE SODIUM 40 MG PO TBEC
40.0000 mg | DELAYED_RELEASE_TABLET | Freq: Two times a day (BID) | ORAL | Status: DC
  Administered 2016-06-11 – 2016-06-12 (×2): 40 mg via ORAL
  Filled 2016-06-11 (×3): qty 1

## 2016-06-11 NOTE — Progress Notes (Signed)
[place] with Lucio Edward regarding mothers status re GI standpoint. Answered questions. Ellouise Newer, PA-C

## 2016-06-11 NOTE — Progress Notes (Signed)
PROGRESS NOTE                                                                                                                                                                                                             Patient Demographics:    [name] Dyer, is a 82 y.o. female, DOB - 08-07-1932, GEZ:[id number]  Admit date - 06/06/2016   Admitting Physician [name], MD  Outpatient Primary MD for the patient is [name], MD  LOS - 4  Chief Complaint  Patient presents with  . Diarrhea  . Medication Reaction       Brief Narrative   Patient is a 82 y.o. female with history of PAF on anticoagulation, chronic diastolic heart failure, hypertension who presented with 2 week history of diarrhea and confusion. Had seen GI M.D. a few days prior to this admission, and was empirically started on Flagyl. CT scan on admission was suggestive of colitis involving the distal colon, and rectosigmoid area. Stool GI pathogen panel, and C. difficile PCR are negative. She interestingly had similar symptoms in March 2016, CT scan abdomen and then also showed colitis involving the descending colon and sigmoid colon. Hospital course has been complicated by persistent diarrhea and melena (since 6/11 afternoon). GI consultanted and scheduled EGD and colonoscopy today.    Subjective:    [name] Dyer has not had much diarrhea today. No vomiting or abdominal pain.    Assessment  & Plan :   Principal Problem: Colitis: Initially presumed to be infectious and empirically started on Cipro/Flagyl - Initial GI pathogen panel was negative- repeat C. difficile toxin is positive. A separate stool sample was negative for C. difficile PCR and toxin.  - colonoscopy>> mild inflammation seen descending colon, sigmoid and rectum. - post infectious vs mild C diff ? IBD - Gastroenterology has started patient on mesalamine and oral vancomycin. Would  plan on at least 10 days of oral vancomycin.  Active Problems: Acute encephalopathy: Suspect secondary to dehydration due to significant amount of diarrhea.Resolved with IV fluids.  ? Melena: Claims to have had coffee-ground stools since 6/11.  Normal EGD  Thrombocytopenia: Suspect secondary to platelet clumping, repeat CBC tomorrow.  Chronic diastolic CHF (congestive heart failure): Clinically compensated- stop IV fluids- continue to follow volume status closely.  Paroxysmal atrial fibrillation: Rate controlled with atenolol, last dose  6/12. Her last dose of Eliquis was 6/9 ,she was placed on IV heparin-however with melena and lightheadedness with drop in hemoglobin  - anticoagulation held - CHADS2VASC of atleast 3 - resume Eliquis today  Hypertension: Controlled with atenolol, resume Lasix once diarrhea is better.  Dyslipidemia: Continue Zocor  Hyponatremia: Secondary to dehydration, resolved with IV fluids.  Hypokalemia:Repleted, likely secondary to diarrhea.  Code Status :   Full code  Family Communication  :   None at bedside  Disposition Plan  :    home tomorrow  Consults  :   GI  Procedures  :   EGD and Colonoscopy    DVT Prophylaxis  :   Held due to possible melena  Lab Results  Component Value Date   PLT  06/11/2016    PLATELET CLUMPS NOTED ON SMEAR, COUNT APPEARS ADEQUATE    Inpatient Medications  Scheduled Meds: . apixaban  5 mg Oral BID  . atenolol  25 mg Oral BID  . calcium-vitamin D  1 tablet Oral BID  . mesalamine  4.8 g Oral Q breakfast  . oxymetazoline  1 spray Each Nare BID  . pantoprazole  40 mg Oral BID  . simvastatin  10 mg Oral Daily  . vancomycin  125 mg Oral Q6H   Continuous Infusions:   PRN Meds:.acetaminophen **OR** acetaminophen, HYDROcodone-acetaminophen, menthol-cetylpyridinium, ondansetron **OR** ondansetron (ZOFRAN) IV  Antibiotics  :    Anti-infectives    Start     Dose/Rate Route Frequency Ordered Stop   06/10/16  1700  vancomycin (VANCOCIN) 50 mg/mL oral solution 125 mg     125 mg Oral Every 6 hours 06/10/16 1340     06/07/16 0800  ciprofloxacin (CIPRO) IVPB 400 mg  Status:  Discontinued     400 mg 200 mL/hr over 60 Minutes Intravenous Every 12 hours 06/06/16 2056 06/08/16 0848   06/07/16 0400  metroNIDAZOLE (FLAGYL) IVPB 500 mg  Status:  Discontinued     500 mg 100 mL/hr over 60 Minutes Intravenous Every 8 hours 06/06/16 2056 06/08/16 0848   06/06/16 1930  ciprofloxacin (CIPRO) tablet 500 mg     500 mg Oral  Once 06/06/16 1924 06/06/16 2018   06/06/16 1930  metroNIDAZOLE (FLAGYL) tablet 500 mg     500 mg Oral  Once 06/06/16 1924 06/06/16 2018         Objective:   Filed Vitals:   06/10/16 1340 06/10/16 1402 06/10/16 2228 06/11/16 0531  BP: 133/58 143/96 134/89 146/79  Pulse: 61 80 85 73  Temp:  97.3 F (36.3 C) 97.6 F (36.4 C) 97.7 F (36.5 C)  TempSrc:  Oral Axillary Oral  Resp: _0 Height:      Weight:    62.143 kg (137 lb)  SpO2: 98% 100% 100% 96%    Wt Readings from Last 3 Encounters:  06/11/16 62.143 kg (137 lb)  06/04/16 59.421 kg (131 lb)  02/29/16 59.784 kg (131 lb 12.8 oz)     Intake/Output Summary (Last 24 hours) at 06/11/16 1506 Last data filed at 06/10/16 1900  Gross per 24 hour  Intake    960 ml  Output      0 ml  Net    960 ml     Physical Exam  Awake Alert, Oriented X 3, No new F.N deficits, Normal affect [place].AT,PERRAL Supple Neck,No JVD, No cervical lymphadenopathy appriciated.  Symmetrical Chest wall movement, Good air movement bilaterally, CTAB RRR,No Gallops,Rubs or new Murmurs, No Parasternal Heave +  ve B.Sounds, Abd Soft, diffuse mild abdominal  tenderness, No organomegaly appriciated, No rebound - guarding or rigidity. No Cyanosis, Clubbing or edema , No new Rash or bruise     Data Review:    CBC  Recent Labs Lab 06/06/16 1716 06/06/16 2017 06/09/16 0540 06/09/16 1552 06/10/16 0655 06/11/16 0527  WBC 11.2*  --  7.1 7.4 8.5  8.9  HGB 13.1  --  11.3* 10.8* 12.3 11.4*  HCT 39.9  --  34.8* 33.6* 38.6 35.3*  PLT PLATELET CLUMPS NOTED ON SMEAR, COUNT APPEARS ADEQUATE 193 PLATELET CLUMPS NOTED ON SMEAR, UNABLE TO ESTIMATE 60* PLATELET CLUMPS NOTED ON SMEAR, COUNT APPEARS ADEQUATE PLATELET CLUMPS NOTED ON SMEAR, COUNT APPEARS ADEQUATE  MCV 89.1  --  87.7 88.2 90.0 89.6  MCH 29.2  --  28.5 28.3 28.7 28.9  MCHC 32.8  --  32.5 32.1 31.9 32.3  RDW 14.0  --  14.4 14.6 14.9 14.8  LYMPHSABS 1.3  --   --   --   --   --   MONOABS 0.8  --   --   --   --   --   EOSABS 0.1  --   --   --   --   --   BASOSABS 0.0  --   --   --   --   --     Chemistries   Recent Labs Lab 06/06/16 1716 06/06/16 2017 06/07/16 0529 06/08/16 0554 06/10/16 0655 06/11/16 0527  NA 133*  --  136 139 139 141  K 3.4*  --  3.5 3.6 3.9 3.7  CL 99*  --  108 112* 113* 114*  CO2 26  --  22 21* 18* 20*  GLUCOSE 112*  --  126* 107* 88 95  BUN 16  --  _0 CREATININE 0.92  --  0.80 0.77 0.83 0.86  CALCIUM 8.3*  --  7.5* 8.0* 8.6* 8.3*  MG  --  1.8  --   --   --   --   AST 27  --  24  --   --   --   ALT 23  --  18  --   --   --   ALKPHOS 65  --  53  --   --   --   BILITOT 1.5*  --  0.7  --   --   --    ------------------------------------------------------------------------------------------------------------------ No results for input(s): CHOL, HDL, LDLCALC, TRIG, CHOLHDL, LDLDIRECT in the last 72 hours.  No results found for: HGBA1C ------------------------------------------------------------------------------------------------------------------ No results for input(s): TSH, T4TOTAL, T3FREE, THYROIDAB in the last 72 hours.  Invalid input(s): FREET3 ------------------------------------------------------------------------------------------------------------------ No results for input(s): VITAMINB12, FOLATE, FERRITIN, TIBC, IRON, RETICCTPCT in the last 72 hours.  Coagulation profile No results for input(s): INR, PROTIME in the last 168  hours.  No results for input(s): DDIMER in the last 72 hours.  Cardiac Enzymes No results for input(s): CKMB, TROPONINI, MYOGLOBIN in the last 168 hours.  Invalid input(s): CK ------------------------------------------------------------------------------------------------------------------    Component Value Date/Time   BNP 201.3* 03/16/2015 1717    Micro Results Recent Results (from the past 240 hour(s))  Gastrointestinal Panel by PCR , Stool     Status: None   Collection Time: 06/07/16  1:06 AM  Result Value Ref Range Status   Campylobacter species NOT DETECTED NOT DETECTED Final   Plesimonas shigelloides NOT DETECTED NOT DETECTED Final   Salmonella species NOT DETECTED NOT DETECTED Final  Yersinia enterocolitica NOT DETECTED NOT DETECTED Final   Vibrio species NOT DETECTED NOT DETECTED Final   Vibrio cholerae NOT DETECTED NOT DETECTED Final   Enteroaggregative E coli (EAEC) NOT DETECTED NOT DETECTED Final   Enteropathogenic E coli (EPEC) NOT DETECTED NOT DETECTED Final   Enterotoxigenic E coli (ETEC) NOT DETECTED NOT DETECTED Final   Shiga like toxin producing E coli (STEC) NOT DETECTED NOT DETECTED Final   E. coli O157 NOT DETECTED NOT DETECTED Final   Shigella/Enteroinvasive E coli (EIEC) NOT DETECTED NOT DETECTED Final   Cryptosporidium NOT DETECTED NOT DETECTED Final   Cyclospora cayetanensis NOT DETECTED NOT DETECTED Final   Entamoeba histolytica NOT DETECTED NOT DETECTED Final   Giardia lamblia NOT DETECTED NOT DETECTED Final   Adenovirus F40/41 NOT DETECTED NOT DETECTED Final   Astrovirus NOT DETECTED NOT DETECTED Final   Norovirus GI/GII NOT DETECTED NOT DETECTED Final   Rotavirus A NOT DETECTED NOT DETECTED Final   Sapovirus (I, II, IV, and V) NOT DETECTED NOT DETECTED Final  C difficile quick scan w PCR reflex     Status: None   Collection Time: 06/07/16  3:30 PM  Result Value Ref Range Status   C Diff antigen NEGATIVE NEGATIVE Final   C Diff toxin NEGATIVE  NEGATIVE Final   C Diff interpretation Negative for toxigenic C. difficile  Final    Radiology Reports Ct Abdomen Pelvis W Contrast  06/06/2016  CLINICAL DATA:  82 year old female with abdominal pain and diarrhea. EXAM: CT ABDOMEN AND PELVIS WITH CONTRAST TECHNIQUE: Multidetector CT imaging of the abdomen and pelvis was performed using the standard protocol following bolus administration of intravenous contrast. CONTRAST:  119m ISOVUE-300 IOPAMIDOL (ISOVUE-300) INJECTION 61% COMPARISON:  CT dated 03/22/2015 FINDINGS: The visualized lung bases are clear. There is dilatation of the right cardiac chambers primarily involving the right atrium. Correlation with echocardiogram recommended. No intra-abdominal free air or free fluid identified There is mottled pattern of contrast enhancement of the liver In the arterial phase compatible with nutmeg liver and secondary to hepatic venous congestion possibly as a result of right cardiac dysfunction. The gallbladder pancreas, spleen, adrenal glands, kidneys, visualized ureters, and urinary bladder appear unremarkable. Hysterectomy. Evaluation of the pelvic structures is limited due to streak artifact caused by left hip arthroplasty. There is thickened appearance of the distal colon and rectosigmoid most compatible with colitis. Correlation with clinical exam and stool cultures recommended. There is no evidence of bowel obstruction. Appendectomy. There is mild aortoiliac atherosclerotic disease. The origins of the celiac axis, SMA, and IMA appear patent. No portal venous gas identified. The SMV, splenic vein, and main portal veins are patent. There is no adenopathy. The abdominal wall soft tissues appear unremarkable. There is degenerative changes of the spine. Left hip arthroplasty. No acute fracture. IMPRESSION: Findings most compatible with colitis. Correlation with clinical exam and stool cultures recommended. No bowel obstruction. Mottled enhancement pattern of the  liver compatible with hepatic venous congestion likely secondary to right cardiac dysfunction. Electronically Signed   By: AAnner CreteM.D.   On: 06/06/2016 18:50   Dg Abd Acute W/chest  06/02/2016  CLINICAL DATA:  82year old female with abdominal pain and diarrhea. EXAM: DG ABDOMEN ACUTE W/ 1V CHEST COMPARISON:  CT dated 03/22/2015 FINDINGS: The lungs are clear. There is no pleural effusion or pneumothorax. The cardiac silhouette is within normal limits. Moderate stool throughout the colon. There is no bowel dilatation or evidence of obstruction. No free air or radiopaque calculi. There is osteopenia  with degenerative changes of spine. Left hip arthroplasty. No acute fracture. IMPRESSION: Negative abdominal radiographs.  No acute cardiopulmonary disease. Electronically Signed   By: Anner Crete M.D.   On: 06/02/2016 23:03    Time Spent in minutes  36   [place] , MD  06/11/2016 at 3:06 PM

## 2016-06-11 NOTE — Care Management Important Message (Signed)
Important Message  Patient Details  Name: [name] Dyer MRN: [id number] Date of Birth: [birth year]   Medicare Important Message Given:  Yes    Camillo Flaming 06/11/2016, 10:10 AMImportant Message  Patient Details  Name: [name] Dyer MRN: [id number] Date of Birth: Apr 16, 1932   Medicare Important Message Given:  Yes    Camillo Flaming 06/11/2016, 10:10 AM

## 2016-06-11 NOTE — Progress Notes (Signed)
1415-Spoke with son Akesha Uresti regarding his mother and plans from GI standpoint. Answered questions. Ellouise Newer, PA-C

## 2016-06-11 NOTE — Progress Notes (Signed)
Progress Note   Subjective  [name] Dyer is an 82 year old Caucasian female who was admitted to the hospital on 06/06/2016 for diarrhea. The patient did undergo EGD and colonoscopy yesterday. EGD was normal with duodenal biopsies pending. Colonoscopy showed mild inflammatory changes in the left colon. The patient was started on Vancomycin due to positive C. difficile test and empiric trial of Lialda 4.8 g per day yesterday.  At time of interview this morning the patient tells me that she did have a bowel movement yesterday which was a "very small amount and somewhat more formed". She is somewhat uncertain though and is not a good historian. Per nursing she did have a small stool yesterday, though this was flushed before they could see it. Patient denies any diarrhea today. She denies any abdominal pain. She does tell me that she has developed a cough and thinks that her "bronchitis his back". The patient reminds me that she is scheduled to go to the [facility] next Monday and would like to be home in time to pack.   Objective   Vital signs in last 24 hours: Temp:  [97.3 F (36.3 C)-98.1 F (36.7 C)] 97.7 F (36.5 C) (06/14 0531) Pulse Rate:  [61-98] 73 (06/14 0531) Resp:  [15-24] 17 (06/14 0531) BP: (114-169)/(58-96) 146/79 mmHg (06/14 0531) SpO2:  [95 %-100 %] 96 % (06/14 0531) Weight:  [137 lb (62.143 kg)-149 lb (67.586 kg)] 137 lb (62.143 kg) (06/14 0531) Last BM Date: 06/10/16 General: [place] female in NAD Heart: Regular rate and rhythm; no murmurs Lungs: Respirations even and unlabored, lungs CTA bilaterally Abdomen: Soft,nontender and nondistended. Normal bowel sounds. Extremities: Without edema. Neurologic: Alert and oriented, grossly normal neurologically. Psych: Cooperative. Normal mood and affect  Intake/Output from previous day: 06/13 0701 - 06/14 0700 In: 1360 [P.O.:360; I.V.:1000] Out: 0   Lab Results:  Recent Labs  06/09/16 1552 06/10/16 0655  06/11/16 0527  WBC 7.4 8.5 8.9  HGB 10.8* 12.3 11.4*  HCT 33.6* 38.6 35.3*  PLT 60* PLATELET CLUMPS NOTED ON SMEAR, COUNT APPEARS ADEQUATE PLATELET CLUMPS NOTED ON SMEAR, COUNT APPEARS ADEQUATE   BMET  Recent Labs  06/10/16 0655 06/11/16 0527  NA 139 141  K 3.9 3.7  CL 113* 114*  CO2 18* 20*  GLUCOSE 88 95  BUN 13 12  CREATININE 0.83 0.86  CALCIUM 8.6* 8.3*   LFT No results for input(s): PROT, ALBUMIN, AST, ALT, ALKPHOS, BILITOT, BILIDIR, IBILI in the last 72 hours. PT/INR No results for input(s): LABPROT, INR in the last 72 hours.  Studies/Results: 06/10/16-Dr. Armbruster-colonoscopy: Non-thrombosed external hemorrhoids found on perianal exam, one 7 mm polyp in the mid transverse colon which was removed, one 7 mm polyp in the descending colon which was removed, examined portion of the ileum was normal, diverticulosis of the left colon, patchy mild inflammation found in the rectum, sigmoid colon and descending colon, nonbleeding internal hemorrhoids in an otherwise normal exam. Overall mild nonspecific inflammatory changes of the left colon thought to be postinfectious versus mild C. Difficile.  06/10/16-Dr. Armbruster-EGD: Esophagus ago gastric landmarks identified, normal stomach, normal duodenal bulb and second portion of the duodenum which was biopsied.   Assessment / Plan:   Impression: 1. Diarrhea:Patient reports 2 weeks of uncontrollable diarrhea, apparently no better after initiation of empiric metronidazole outpatient or addition of Cipro since patient has been admitted; no response to scheduled Lomotil ;CT with question of colitis, see report above for;  C. difficile PCR returned positive from 06/06/2016-colonoscopy Showed mild  inflammation, patient started on vancomycin and emperic Lialda 4.8g 06/10/16-symptoms do seem to be improving 2. Abnormal CT of abdomen: See above 3. Acute encephalopathy: Resolved since time of admission 4. Paroxysmal A. fib: Hospitalist following,  patient is maintained on Eliquis which was DC'd on Friday, heparin DC 06/09/16 5. Melena: Reported to have started 06/08/16 afternoon. No further report reports of melena from 6/12 to 6/13; EGD 06/10/16 normal  Plan: 1. Continue supportive measures 2. Continue Lialda 4.8 g per day 3. Continue Vancomycin 125 mg 4 times daily for 10 days 4. Reviewed colonoscopy and EGD findings with the patient today and answered questions 5. Did ask nursing to observe patient's stool today, likely if continues to improve will discuss discharge tomorrow 6. Discussed above with Dr. Havery Moros, please await any further recommendations.  Thank you for your kind consultation, we will continue to follow.  Principal Problem:   Colitis presumed infectious Active Problems:   Acute encephalopathy   Chronic diastolic CHF (congestive heart failure) (HCC)   Paroxysmal atrial fibrillation (HCC)   Hypertension   Hyperlipidemia   Hepatic congestion   Colitis   Colon polyp     LOS: 4 days   Levin Erp  06/11/2016, 10:20 AM

## 2016-06-12 ENCOUNTER — Telehealth: Payer: Self-pay | Admitting: Internal Medicine

## 2016-06-12 DIAGNOSIS — I10 Essential (primary) hypertension: Secondary | ICD-10-CM

## 2016-06-12 DIAGNOSIS — I5032 Chronic diastolic (congestive) heart failure: Secondary | ICD-10-CM

## 2016-06-12 DIAGNOSIS — E785 Hyperlipidemia, unspecified: Secondary | ICD-10-CM

## 2016-06-12 DIAGNOSIS — K635 Polyp of colon: Secondary | ICD-10-CM

## 2016-06-12 DIAGNOSIS — G9340 Encephalopathy, unspecified: Secondary | ICD-10-CM

## 2016-06-12 LAB — BLOOD GAS, ARTERIAL
Acid-base deficit: 4 mmol/L — ABNORMAL HIGH (ref 0.0–2.0)
Bicarbonate: 19.1 mEq/L — ABNORMAL LOW (ref 20.0–24.0)
Drawn by: 257701
FIO2: 0.21
O2 Saturation: 96 %
Patient temperature: 98.6
TCO2: 17.3 mmol/L (ref 0–100)
pCO2 arterial: 30.2 mmHg — ABNORMAL LOW (ref 35.0–45.0)
pH, Arterial: 7.417 (ref 7.350–7.450)
pO2, Arterial: 83.3 mmHg (ref 80.0–100.0)

## 2016-06-12 LAB — BASIC METABOLIC PANEL
Anion gap: 7 (ref 5–15)
BUN: 14 mg/dL (ref 6–20)
CO2: 19 mmol/L — ABNORMAL LOW (ref 22–32)
Calcium: 8.5 mg/dL — ABNORMAL LOW (ref 8.9–10.3)
Chloride: 114 mmol/L — ABNORMAL HIGH (ref 101–111)
Creatinine, Ser: 0.75 mg/dL (ref 0.44–1.00)
GFR calc Af Amer: >60 mL/min (ref >60)
GFR calc non Af Amer: >60 mL/min (ref >60)
Glucose, Bld: 103 mg/dL — ABNORMAL HIGH (ref 65–99)
Potassium: 3.9 mmol/L (ref 3.5–5.1)
Sodium: 140 mmol/L (ref 135–145)

## 2016-06-12 LAB — CBC
HCT: 35.8 % — ABNORMAL LOW (ref 36.0–46.0)
Hemoglobin: 11.6 g/dL — ABNORMAL LOW (ref 12.0–15.0)
MCH: 28.2 pg (ref 26.0–34.0)
MCHC: 32.4 g/dL (ref 30.0–36.0)
MCV: 87.1 fL (ref 78.0–100.0)
Platelets: ADEQUATE 10*3/uL (ref 150–400)
RBC: 4.11 MIL/uL (ref 3.87–5.11)
RDW: 14.8 % (ref 11.5–15.5)
WBC: 10.8 10*3/uL — ABNORMAL HIGH (ref 4.0–10.5)

## 2016-06-12 MED ORDER — POTASSIUM CHLORIDE 10 MEQ/100ML IV SOLN
10.0000 meq | INTRAVENOUS | Status: DC
Start: 2016-06-12 — End: 2016-06-12
  Filled 2016-06-12 (×4): qty 100

## 2016-06-12 MED ORDER — MESALAMINE 1.2 G PO TBEC: 2.4000 g | DELAYED_RELEASE_TABLET | Freq: Every day | ORAL | Status: DC

## 2016-06-12 MED ORDER — VANCOMYCIN 50 MG/ML ORAL SOLUTION: ORAL | Status: DC

## 2016-06-12 MED ORDER — FUROSEMIDE 10 MG/ML IJ SOLN
20.0000 mg | Freq: Once | INTRAMUSCULAR | Status: AC
  Administered 2016-06-12: 20 mg via INTRAVENOUS
  Filled 2016-06-12: qty 2

## 2016-06-12 MED ORDER — POTASSIUM CHLORIDE CRYS ER 20 MEQ PO TBCR
40.0000 meq | EXTENDED_RELEASE_TABLET | Freq: Once | ORAL | Status: AC
  Administered 2016-06-12: 40 meq via ORAL
  Filled 2016-06-12: qty 2

## 2016-06-12 MED ORDER — MESALAMINE 1.2 G PO TBEC
2.4000 g | DELAYED_RELEASE_TABLET | Freq: Every day | ORAL | Status: DC
  Filled 2016-06-12: qty 2

## 2016-06-12 NOTE — Discharge Instructions (Signed)
Low-Fiber Diet Fiber is found in fruits, vegetables, and whole grains. A low-fiber diet restricts fibrous foods that are not digested in the small intestine. A diet containing about 10-15 grams of fiber per day is considered low fiber. Low-fiber diets may be used to:  Promote healing and rest the bowel during intestinal flare-ups.  Prevent blockage of a partially obstructed or narrowed gastrointestinal tract.  Reduce fecal weight and volume.  Slow the movement of feces. You may be on a low-fiber diet as a transitional diet following surgery, after an injury (trauma), or because of a short (acute) or lifelong (chronic) illness. Your health care provider will determine the length of time you need to stay on this diet.  WHAT DO I NEED TO KNOW ABOUT A LOW-FIBER DIET? Always check the fiber content on the packaging's Nutrition Facts label, especially on foods from the grains list. Ask your dietitian if you have questions about specific foods that are related to your condition, especially if the food is not listed below. In general, a low-fiber food will have less than 2 g of fiber. WHAT FOODS CAN I EAT? Grains All breads and crackers made with white flour. Sweet rolls, doughnuts, waffles, pancakes, [place] toast, bagels. Pretzels, Melba toast, zwieback. Well-cooked cereals, such as cornmeal, farina, or cream cereals. Dry cereals that do not contain whole grains, fruit, or nuts, such as refined corn, wheat, rice, and oat cereals. Potatoes prepared any way without skins, plain pastas and noodles, refined white rice. Use white flour for baking and making sauces. Use allowed list of grains for casseroles, dumplings, and puddings.  Vegetables Strained tomato and vegetable juices. Fresh lettuce, cucumber, spinach. Well-cooked (no skin or pulp) or canned vegetables, such as asparagus, bean sprouts, beets, carrots, green beans, mushrooms, potatoes, pumpkin, spinach, yellow squash, tomato sauce/puree, turnips,  yams, and zucchini. Keep servings limited to  cup.  Fruits All fruit juices except prune juice. Cooked or canned fruits without skin and seeds, such as applesauce, apricots, cherries, fruit cocktail, grapefruit, grapes, mandarin oranges, melons, peaches, pears, pineapple, and plums. Fresh fruits without skin, such as apricots, avocados, bananas, melons, pineapple, nectarines, and peaches. Keep servings limited to  cup or 1 piece.  Meat and Other Protein Sources Ground or well-cooked tender beef, ham, veal, lamb, pork, or poultry. Eggs, plain cheese. Fish, oysters, shrimp, lobster, and other seafood. Liver, organ meats. Smooth nut butters. Dairy All milk products and alternative dairy substitutes, such as soy, rice, almond, and coconut, not containing added whole nuts, seeds, or added fruit. Beverages Decaf coffee, fruit, and vegetable juices or smoothies (small amounts, with no pulp or skins, and with fruits from allowed list), sports drinks, herbal tea. Condiments Ketchup, mustard, vinegar, cream sauce, cheese sauce, cocoa powder. Spices in moderation, such as allspice, basil, bay leaves, celery powder or leaves, cinnamon, cumin powder, curry powder, ginger, mace, marjoram, onion or garlic powder, oregano, paprika, parsley flakes, ground pepper, rosemary, sage, savory, tarragon, thyme, and turmeric. Sweets and Desserts Plain cakes and cookies, pie made with allowed fruit, pudding, custard, cream pie. Gelatin, fruit, ice, sherbet, frozen ice pops. Ice cream, ice milk without nuts. Plain hard candy, honey, jelly, molasses, syrup, sugar, chocolate syrup, gumdrops, marshmallows. Limit overall sugar intake.  Fats and Oil Margarine, butter, cream, mayonnaise, salad oils, plain salad dressings made from allowed foods. Choose healthy fats such as olive oil, canola oil, and omega-3 fatty acids (such as found in salmon or tuna) when possible.  Other Bouillon, broth, or cream soups made  from allowed foods.  Any strained soup. Casseroles or mixed dishes made with allowed foods. The items listed above may not be a complete list of recommended foods or beverages. Contact your dietitian for more options.  WHAT FOODS ARE NOT RECOMMENDED? Grains All whole wheat and whole grain breads and crackers. Multigrains, rye, bran seeds, nuts, or coconut. Cereals containing whole grains, multigrains, bran, coconut, nuts, raisins. Cooked or dry oatmeal, steel-cut oats. Coarse wheat cereals, granola. Cereals advertised as high fiber. Potato skins. Whole grain pasta, wild or brown rice. Popcorn. Coconut flour. Bran, buckwheat, corn bread, multigrains, rye, wheat germ.  Vegetables Fresh, cooked or canned vegetables, such as artichokes, asparagus, beet greens, broccoli, Brussels sprouts, cabbage, celery, cauliflower, corn, eggplant, kale, legumes or beans, okra, peas, and tomatoes. Avoid large servings of any vegetables, especially raw vegetables.  Fruits Fresh fruits, such as apples with or without skin, berries, cherries, figs, grapes, grapefruit, guavas, kiwis, mangoes, oranges, papayas, pears, persimmons, pineapple, and pomegranate. Prune juice and juices with pulp, stewed or dried prunes. Dried fruits, dates, raisins. Fruit seeds or skins. Avoid large servings of all fresh fruits. Meats and Other Protein Sources Tough, fibrous meats with gristle. Chunky nut butter. Cheese made with seeds, nuts, or other foods not recommended. Nuts, seeds, legumes (beans, including baked beans), dried peas, beans, lentils.  Dairy Yogurt or cheese that contains nuts, seeds, or added fruit.  Beverages Fruit juices with high pulp, prune juice. Caffeinated coffee and teas.  Condiments Coconut, maple syrup, pickles, olives. Sweets and Desserts Desserts, cookies, or candies that contain nuts or coconut, chunky peanut butter, dried fruits. Jams, preserves with seeds, marmalade. Large amounts of sugar and sweets. Any other dessert made with  fruits from the not recommended list.  Other Soups made from vegetables that are not recommended or that contain other foods not recommended.  The items listed above may not be a complete list of foods and beverages to avoid. Contact your dietitian for more information.   This information is not intended to replace advice given to you by your health care provider. Make sure you discuss any questions you have with your health care provider.   Document Released: 06/06/2002 Document Revised: 12/20/2013 Document Reviewed: 11/07/2013 Elsevier Interactive Patient Education [employer].

## 2016-06-12 NOTE — Progress Notes (Addendum)
Patient c/o shortness of breath, wheezing and BLE edema. Fine crackles noted at lung bases. MD notified. New orders placed including walking O2 saturation, ABG, lasix & potassium. [name] O2 while ambulating up and down hall remained at 99% on room air. After lasix administration and urinating approx. 5 to 7 times, patient no longer c/o shortness of breath and wheezing. She also states that the swelling is reduced in her legs/feet. MD notified. Dr. Wynelle Cleveland stated it was okay for patient to be discharged tonight.

## 2016-06-12 NOTE — Discharge Summary (Addendum)
Physician Discharge Summary  [name] Dyer MOQ:[id number] DOB: 05/07/1932 DOA: 06/06/2016  PCP: [name], MD  Admit date: 06/06/2016 Discharge date: 06/12/2016  Time spent: 55 minutes  Recommendations for Outpatient Follow-up:  1. Follow-up with GI  Discharge Condition: Stable    Discharge Diagnoses:  Principal Problem:   Colitis presumed infectious Active Problems:   Acute encephalopathy   Chronic diastolic CHF (congestive heart failure) (HCC)   Paroxysmal atrial fibrillation (HCC)   Hypertension   Hyperlipidemia   Colon polyp   History of present illness:  Patient is a 82 y.o. female with history of PAF on anticoagulation, chronic diastolic heart failure, hypertension who presented with 2 week history of diarrhea and confusion. Had seen GI M.D. a few days prior to this admission, and was empirically started on Flagyl. CT scan on admission was suggestive of colitis involving the distal colon, and rectosigmoid area. Stool GI pathogen panel, and C. difficile PCR are negative. She interestingly had similar symptoms in March 2016, CT scan abdomen and then also showed colitis involving the descending colon and sigmoid colon.  Hospital Course:  Colitis: - Started initially on ciprofloxacin and Flagyl-this was discontinued-GI pathogen panel was negative -C. difficile testing done initially toxin was detected on 6/9-repeat testing on 6/10 showed that both toxin and antigen were negative - colonoscopy>> mild inflammation seen in descending colon, sigmoid and rectum -GI suspects post infectious vs mild C diff versus IBD - Gastroenterology has started patient on mesalamine and oral vancomycin and has recommended at least 10 days of oral vancomycin. -Now having one to 2 bowel movements a day-stool slightly loose-no abdominal pain -GI will follow up in clinic  Active Problems: Acute encephalopathy:  - Suspect secondary to dehydration due to significant amount of diarrhea.Resolved  with IV fluids.  ? Melena:  - Claims to have had black stools since 6/11. 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 Her last dose of Eliquis was 6/9 ,she was placed on IV heparin-however with melena and lightheadedness with drop in hemoglobin - anticoagulation held - CHADS2VASC of atleast 3 - resumed Eliquis on 6/14-no blood noted and stool  Hypertension:   -Controlled with atenolol, resume Lasix once diarrhea is better.  Dyslipidemia:  -Continue Zocor  Hyponatremia:  -Secondary to dehydration, resolved with IV fluids.  Hypokalemia: -Repleted, likely secondary to diarrhea.  Procedures:  Colonoscopy  Consultations:  GI  Discharge Exam: Filed Weights   06/10/16 1139 06/11/16 0531 06/12/16 0515  Weight: 67.586 kg (149 lb) 62.143 kg (137 lb) 66.225 kg (146 lb)   Filed Vitals:   06/11/16 2046 06/12/16 0515  BP: 147/79 150/77  Pulse: 95 85  Temp: 97.5 F (36.4 C) 98 F (36.7 C)  Resp: 18 18    General: AAO x 3, no distress Cardiovascular: RRR, no murmurs  Respiratory: clear to auscultation bilaterally GI: soft, non-tender, non-distended, bowel sound positive  Discharge Instructions You were cared for by a hospitalist during your hospital stay. If you have any questions about your discharge medications or the care you received while you were in the hospital after you are discharged, you can call the unit and asked to speak with the hospitalist on call if the hospitalist that took care of you is not available. Once  you are discharged, your primary care physician will handle any further medical issues. Please note that NO REFILLS for any discharge medications will be authorized once you are  discharged, as it is imperative that you return to your primary care physician (or establish a relationship with a primary care physician if you do not have one) for your aftercare needs so that they can reassess your need for medications and monitor your lab values.      Discharge Instructions    Discharge instructions    Complete by:  As directed   Low fiber diet for a minimal of 2 weeks Continue low-salt heart healthy diet as well     Increase activity slowly    Complete by:  As directed             Medication List    STOP taking these medications        metroNIDAZOLE 500 MG tablet  Commonly known as:  FLAGYL      TAKE these medications        apixaban 5 MG Tabs tablet  Commonly known as:  ELIQUIS  Take 1 tablet (5 mg total) by mouth 2 (two) times daily.     atenolol 25 MG tablet  Commonly known as:  TENORMIN  Take 1 tablet (25 mg total) by mouth 2 (two) times daily.     calcium-vitamin D 500-200 MG-UNIT tablet  Commonly known as:  OSCAL WITH D  Take 1 tablet by mouth 2 (two) times daily.     estradiol 1 MG tablet  Commonly known as:  ESTRACE  Take 0.5 mg by mouth every other day.     furosemide 20 MG tablet  Commonly known as:  LASIX  Take 1 tablet (20 mg total) by mouth daily. Marland Kitchen     GLUCOSAMINE CHONDR 1500 COMPLX PO  Take 1 tablet by mouth 2 (two) times daily.     loperamide 2 MG tablet  Commonly known as:  IMODIUM A-D  Take 2 mg by mouth 4 (four) times daily as needed for diarrhea or loose stools. Reported on 06/04/2016     Melatonin 5 MG Caps  Take 1 capsule by mouth at bedtime.     mesalamine 1.2 g EC tablet  Commonly known as:  LIALDA  Take 2 tablets (2.4 g total) by mouth daily with breakfast.  Start taking on:  06/13/2016     potassium chloride 10 MEQ tablet  Commonly known as:  K-DUR  Take 1 tablet (10 mEq total) by mouth daily.     simvastatin 20 MG tablet  Commonly known as:  ZOCOR  Take 10 mg by mouth daily.     vancomycin 50 mg/mL oral  solution  Commonly known as:  VANCOCIN  125 mg QID x 10 days-       Allergies  Allergen Reactions  . Sulfa Antibiotics Hives    Severe Rash   Follow-up Information    Follow up with Silvano Rusk, MD. Go on 07/18/2016.   Specialty:  Gastroenterology   Why:  Arrive at 2:45 for a 3:00 appt for follow-up of diarrhea   Contact information:   [address] [name] [address] [phone]        The results of significant diagnostics from this hospitalization (including imaging, microbiology, ancillary and laboratory) are listed below for reference.    Significant Diagnostic Studies: Ct Abdomen Pelvis W Contrast  06/06/2016  CLINICAL DATA:  82 year old female with abdominal pain and diarrhea. EXAM:  CT ABDOMEN AND PELVIS WITH CONTRAST TECHNIQUE: Multidetector CT imaging of the abdomen and pelvis was performed using the standard protocol following bolus administration of intravenous contrast. CONTRAST:  140m ISOVUE-300 IOPAMIDOL (ISOVUE-300) INJECTION 61% COMPARISON:  CT dated 03/22/2015 FINDINGS: The visualized lung bases are clear. There is dilatation of the right cardiac chambers primarily involving the right atrium. Correlation with echocardiogram recommended. No intra-abdominal free air or free fluid identified There is mottled pattern of contrast enhancement of the liver In the arterial phase compatible with nutmeg liver and secondary to hepatic venous congestion possibly as a result of right cardiac dysfunction. The gallbladder pancreas, spleen, adrenal glands, kidneys, visualized ureters, and urinary bladder appear unremarkable. Hysterectomy. Evaluation of the pelvic structures is limited due to streak artifact caused by left hip arthroplasty. There is thickened appearance of the distal colon and rectosigmoid most compatible with colitis. Correlation with clinical exam and stool cultures recommended. There is no evidence of bowel obstruction. Appendectomy. There is mild aortoiliac  atherosclerotic disease. The origins of the celiac axis, SMA, and IMA appear patent. No portal venous gas identified. The SMV, splenic vein, and main portal veins are patent. There is no adenopathy. The abdominal wall soft tissues appear unremarkable. There is degenerative changes of the spine. Left hip arthroplasty. No acute fracture. IMPRESSION: Findings most compatible with colitis. Correlation with clinical exam and stool cultures recommended. No bowel obstruction. Mottled enhancement pattern of the liver compatible with hepatic venous congestion likely secondary to right cardiac dysfunction. Electronically Signed   By: AAnner CreteM.D.   On: 06/06/2016 18:50   Dg Abd Acute W/chest  06/02/2016  CLINICAL DATA:  82year old female with abdominal pain and diarrhea. EXAM: DG ABDOMEN ACUTE W/ 1V CHEST COMPARISON:  CT dated 03/22/2015 FINDINGS: The lungs are clear. There is no pleural effusion or pneumothorax. The cardiac silhouette is within normal limits. Moderate stool throughout the colon. There is no bowel dilatation or evidence of obstruction. No free air or radiopaque calculi. There is osteopenia with degenerative changes of spine. Left hip arthroplasty. No acute fracture. IMPRESSION: Negative abdominal radiographs.  No acute cardiopulmonary disease. Electronically Signed   By: AAnner CreteM.D.   On: 06/02/2016 23:03    Microbiology: Recent Results (from the past 240 hour(s))  Gastrointestinal Panel by PCR , Stool     Status: None   Collection Time: 06/07/16  1:06 AM  Result Value Ref Range Status   Campylobacter species NOT DETECTED NOT DETECTED Final   Plesimonas shigelloides NOT DETECTED NOT DETECTED Final   Salmonella species NOT DETECTED NOT DETECTED Final   Yersinia enterocolitica NOT DETECTED NOT DETECTED Final   Vibrio species NOT DETECTED NOT DETECTED Final   Vibrio cholerae NOT DETECTED NOT DETECTED Final   Enteroaggregative E coli (EAEC) NOT DETECTED NOT DETECTED Final    Enteropathogenic E coli (EPEC) NOT DETECTED NOT DETECTED Final   Enterotoxigenic E coli (ETEC) NOT DETECTED NOT DETECTED Final   Shiga like toxin producing E coli (STEC) NOT DETECTED NOT DETECTED Final   E. coli O157 NOT DETECTED NOT DETECTED Final   Shigella/Enteroinvasive E coli (EIEC) NOT DETECTED NOT DETECTED Final   Cryptosporidium NOT DETECTED NOT DETECTED Final   Cyclospora cayetanensis NOT DETECTED NOT DETECTED Final   Entamoeba histolytica NOT DETECTED NOT DETECTED Final   Giardia lamblia NOT DETECTED NOT DETECTED Final   Adenovirus F40/41 NOT DETECTED NOT DETECTED Final   Astrovirus NOT DETECTED NOT DETECTED Final   Norovirus GI/GII NOT DETECTED NOT DETECTED Final  Rotavirus A NOT DETECTED NOT DETECTED Final   Sapovirus (I, II, IV, and V) NOT DETECTED NOT DETECTED Final  C difficile quick scan w PCR reflex     Status: None   Collection Time: 06/07/16  3:30 PM  Result Value Ref Range Status   C Diff antigen NEGATIVE NEGATIVE Final   C Diff toxin NEGATIVE NEGATIVE Final   C Diff interpretation Negative for toxigenic C. difficile  Final     Labs: Basic Metabolic Panel:  Recent Labs Lab 06/06/16 2017 06/07/16 0529 06/08/16 0554 06/10/16 0655 06/11/16 0527 06/12/16 0523  NA  --  136 139 139 141 140  K  --  3.5 3.6 3.9 3.7 3.9  CL  --  108 112* 113* 114* 114*  CO2  --  22 21* 18* 20* 19*  GLUCOSE  --  126* 107* 88 95 103*  BUN  --  _0 CREATININE  --  0.80 0.77 0.83 0.86 0.75  CALCIUM  --  7.5* 8.0* 8.6* 8.3* 8.5*  MG 1.8  --   --   --   --   --    Liver Function Tests:  Recent Labs Lab 06/06/16 1716 06/07/16 0529  AST 27 24  ALT 23 18  ALKPHOS 65 53  BILITOT 1.5* 0.7  PROT 6.7 5.3*  ALBUMIN 3.6 2.7*   No results for input(s): LIPASE, AMYLASE in the last 168 hours.  Recent Labs Lab 06/06/16 2017  AMMONIA 20   CBC:  Recent Labs Lab 06/06/16 1716  06/09/16 0540 06/09/16 1552 06/10/16 0655 06/11/16 0527 06/12/16 0523  WBC 11.2*  --   7.1 7.4 8.5 8.9 10.8*  NEUTROABS 9.0*  --   --   --   --   --   --   HGB 13.1  --  11.3* 10.8* 12.3 11.4* 11.6*  HCT 39.9  --  34.8* 33.6* 38.6 35.3* 35.8*  MCV 89.1  --  87.7 88.2 90.0 89.6 87.1  PLT PLATELET CLUMPS NOTED ON SMEAR, COUNT APPEARS ADEQUATE  < > PLATELET CLUMPS NOTED ON SMEAR, UNABLE TO ESTIMATE 60* PLATELET CLUMPS NOTED ON SMEAR, COUNT APPEARS ADEQUATE PLATELET CLUMPS NOTED ON SMEAR, COUNT APPEARS ADEQUATE PLATELET CLUMPS NOTED ON SMEAR, COUNT APPEARS ADEQUATE  < > = values in this interval not displayed. Cardiac Enzymes: No results for input(s): CKTOTAL, CKMB, CKMBINDEX, TROPONINI in the last 168 hours. BNP: BNP (last 3 results) No results for input(s): BNP in the last 8760 hours.  ProBNP (last 3 results)  Recent Labs  08/27/15 1021  PROBNP 318.0*    CBG: No results for input(s): GLUCAP in the last 168 hours.     SignedDebbe Odea, MD [facility] 06/12/2016, 2:32 PM

## 2016-06-12 NOTE — Progress Notes (Signed)
Pt to dc home on PO Vancomycin.  Pt has Medicare insurance. Pt encouraged to call her regular pharmacy to see if they carry the generic Vancomycin, if not she was informed to go to [facility] in the [facility].  Pt states that she will do this.  No other Cm needs communicated. Marney Doctor RN,BSN,NCM [phone]

## 2016-06-12 NOTE — Progress Notes (Signed)
Progress Note   Subjective  [name] is an 82 year old Caucasian female who is admitted to the hospital on 06/06/16 for two-week history of diarrhea. The patient was found to be positive for C. difficile and had a colonoscopy showing mild inflammatory changes in the left colon. She was started on Lialda 4.8 g per day on 06/10/16 and Vancomycin 125 mg 4 times daily.  At time of interview this morning the patient tells me that she has continued to improve. She tells me she only had 3 small somewhat formed stools yesterday. She does complain of wheezing and a cough as well as a runny nose, but overall feels better from a "GI point of view".   Objective   Vital signs in last 24 hours: Temp:  [97.5 F (36.4 C)-98 F (36.7 C)] 98 F (36.7 C) (06/15 0515) Pulse Rate:  [85-103] 85 (06/15 0515) Resp:  [17-18] 18 (06/15 0515) BP: (147-155)/(77-88) 150/77 mmHg (06/15 0515) SpO2:  [98 %-99 %] 98 % (06/15 0515) Weight:  [146 lb (66.225 kg)] 146 lb (66.225 kg) (06/15 0515) Last BM Date: 06/11/16 General: [place] female in NAD Heart: Regular rate and rhythm; no murmurs Lungs: Respirations even and unlabored, lungs CTA bilaterally Abdomen: Soft,nontender and nondistended. Normal bowel sounds. Extremities: Without edema. Neurologic: Alert and oriented, grossly normal neurologically. Psych: Cooperative. Normal mood and affect   Lab Results:  Recent Labs  06/10/16 0655 06/11/16 0527 06/12/16 0523  WBC 8.5 8.9 10.8*  HGB 12.3 11.4* 11.6*  HCT 38.6 35.3* 35.8*  PLT PLATELET CLUMPS NOTED ON SMEAR, COUNT APPEARS ADEQUATE PLATELET CLUMPS NOTED ON SMEAR, COUNT APPEARS ADEQUATE PLATELET CLUMPS NOTED ON SMEAR, COUNT APPEARS ADEQUATE   BMET  Recent Labs  06/10/16 0655 06/11/16 0527 06/12/16 0523  NA 139 141 140  K 3.9 3.7 3.9  CL 113* 114* 114*  CO2 18* 20* 19*  GLUCOSE 88 95 103*  BUN _0 CREATININE 0.83 0.86 0.75  CALCIUM 8.6* 8.3* 8.5*   LFT No results for  input(s): PROT, ALBUMIN, AST, ALT, ALKPHOS, BILITOT, BILIDIR, IBILI in the last 72 hours. PT/INR No results for input(s): LABPROT, INR in the last 72 hours.  Studies/Results: No results found.     Assessment / Plan:   Impression: 1. Diarrhea:Patient reports 2 weeks of uncontrollable diarrhea, apparently no better after initiation of empiric metronidazole outpatient or addition of Cipro since patient has been admitted; no response to scheduled Lomotil ;CT with question of colitis, see report above for; C. difficile PCR returned positive from 06/06/2016-colonoscopy Showed mild inflammation, patient started on vancomycin and emperic Lialda 4.8g 06/10/16-symptoms continue to improve 2. Abnormal CT of abdomen: See above 3. Acute encephalopathy: Resolved since time of admission 4. Paroxysmal A. fib: Hospitalist following, patient is maintained on Eliquis which was DC'd on Friday, heparin DC 06/09/16 5. Melena: Reported to have started 06/08/16 afternoon. No further report reports of melena from 6/12 to 6/13; EGD 06/10/16 normal  Plan: 1. Continue supportive measures 2. Will decrease Lialda to 2.4 G per day, this should be continued outpatient 3. Continue Vancomycin 125 mg 4 times daily for total of 10 day course 4. Recommended low fiber/low residue diet while pt is on Vancomycin course, she may then resume regular diet. 5. Follow up with Dr. Carlean Purl on July 21st at 3:00pm in our outpatient clinic 6. Will discuss above with Dr. Havery Moros, please await any further recommendations  Thank you for your kind consultation, we will sign off.  Principal Problem:  Colitis presumed infectious Active Problems:   Acute encephalopathy   Chronic diastolic CHF (congestive heart failure) (HCC)   Paroxysmal atrial fibrillation ([place])   Hypertension   Hyperlipidemia   Hepatic congestion   Colitis   Colon polyp     LOS: 5 days   Levin Erp  06/12/2016, 9:21 AM  Pager # [phone]

## 2016-06-15 NOTE — Progress Notes (Signed)
Agree with Ms. Zehr's management.  Timeka Goette E. Phynix Horton, MD, FACG  

## 2016-06-16 ENCOUNTER — Telehealth: Payer: Self-pay | Admitting: Internal Medicine

## 2016-06-16 NOTE — Telephone Encounter (Signed)
Pt calling c/o going to [facility] for SOB-coughing-both feet and legs swelling-wants to know if what she is doing is right-- [phone]

## 2016-06-16 NOTE — Telephone Encounter (Signed)
Pt was in hospital last week with confusion and diarrhea. She stated on day of discharge she was given lasix in her IV for SOB, wheezing, swelling, which helped.  She was instructed to start back on home lasix and potassium, which she has done. Her swelling is improving slowly, she sleeps with her head elevated.  I asked her to weigh herself and keep track to watch for trend.  She will start doing this.  Patient states that she is still feeling confused.  "I walked in my house and feel like somebody moved all my furniture around.  I can't find anything" She stated this was caused from the previous antibiotic she was taking.  Pt had questions regarding vancomycin dose.  Reviewed. Directions say 2.5 ml four times daily.  She will take at 8-12-4-8.  Has f/u in GI 07/18/16. Advised am forwarding to Dr. Harrington Challenger to make her aware and if there are any new recommendations from cardiology we will call her back.  Pt verbalizes understanding and agreement.

## 2016-06-17 DIAGNOSIS — R410 Disorientation, unspecified: Secondary | ICD-10-CM | POA: Diagnosis not present

## 2016-06-17 DIAGNOSIS — A09 Infectious gastroenteritis and colitis, unspecified: Secondary | ICD-10-CM | POA: Diagnosis not present

## 2016-06-17 NOTE — Telephone Encounter (Signed)
Called patient who states she is feeling better with her breathing and has her weight is down 2 lbs since yesterday.  She states she left a message for PCP yesterday but has not heard back yet regarding her confusion. Today she states she is supposed to drive her son's car to the beach in the AM and she went out and "practiced a little bit yesterday". And when she came back into her neighborhood she hardly recognized any of the streets.  Pt states that she will have to wait until she gets off "all this medication" before we will know what the cause is. I told her I disagree and urged her to contact PCP again today.  She verbalizes understanding and agreement.

## 2016-06-17 NOTE — Telephone Encounter (Signed)
Antibiotic dosing needs to be addressed by primary MD

## 2016-06-23 ENCOUNTER — Other Ambulatory Visit: Payer: Self-pay | Admitting: Internal Medicine

## 2016-06-23 NOTE — Telephone Encounter (Signed)
Close encounter 

## 2016-06-26 ENCOUNTER — Telehealth: Payer: Self-pay

## 2016-06-26 NOTE — Telephone Encounter (Signed)
Prior auth for Eliquis 5 mg sent to [facility].

## 2016-07-15 DIAGNOSIS — F419 Anxiety disorder, unspecified: Secondary | ICD-10-CM | POA: Diagnosis not present

## 2016-07-15 DIAGNOSIS — R21 Rash and other nonspecific skin eruption: Secondary | ICD-10-CM | POA: Diagnosis not present

## 2016-07-18 ENCOUNTER — Encounter: Payer: Self-pay | Admitting: Internal Medicine

## 2016-07-18 ENCOUNTER — Ambulatory Visit (INDEPENDENT_AMBULATORY_CARE_PROVIDER_SITE_OTHER): Payer: Medicare Other | Admitting: Internal Medicine

## 2016-07-18 VITALS — BP 118/60 | HR 66 | Ht 63.0 in | Wt 127.4 lb

## 2016-07-18 DIAGNOSIS — K529 Noninfective gastroenteritis and colitis, unspecified: Secondary | ICD-10-CM

## 2016-07-18 DIAGNOSIS — A09 Infectious gastroenteritis and colitis, unspecified: Secondary | ICD-10-CM | POA: Diagnosis not present

## 2016-07-18 DIAGNOSIS — R413 Other amnesia: Secondary | ICD-10-CM

## 2016-07-18 NOTE — Progress Notes (Signed)
Subjective:  Chief complaint: Follow-up colitis/diarrhea  Patient ID: [name] Dyer, female    DOB: [birth year], 82 y.o.   MRN: [id number]  HPI This is a nice lady was admitted to the hospital with a diarrheal illness in June, she was evaluated with upper endoscopy and colonoscopy and had some nonspecific colitis-like changes on biopsies suggestive of ischemia more than anything. She was treated with antibiotics and empiric mesalamine and she has resolved with respect to that. She reports that prior to the hospitalization she had some mild memory problems, she would sometimes get lost when driving and have to wait to remember where she was. During and after the hospitalization she was confused and out of sorts and though that is gone and she is still not quite right she thinks. She still has some of these memory issues. She had a neurology appointment offered and scheduled but she decided not to follow-up with that. She is due to see her primary care physician next month for a routine follow-up. She had some sort of a mild rash was treated with Depo Medrol shot by the PA at Dr. [facility] practice a few weeks ago. The rash is better. She is concerned that she feels somewhat fatigued and doesn't have the energy she used to have. Allergies  Allergen Reactions  . Sulfa Antibiotics Hives    Severe Rash   Outpatient Prescriptions Prior to Visit  Medication Sig Dispense Refill  . atenolol (TENORMIN) 25 MG tablet Take 1 tablet (25 mg total) by mouth 2 (two) times daily. 60 tablet 11  . calcium-vitamin D (OSCAL WITH D) 500-200 MG-UNIT tablet Take 1 tablet by mouth 2 (two) times daily.    Marland Kitchen ELIQUIS 5 MG TABS tablet TAKE 1 TABLET TWICE A DAY 180 tablet 2  . estradiol (ESTRACE) 1 MG tablet Take 0.5 mg by mouth every other day.     . furosemide (LASIX) 20 MG tablet Take 1 tablet (20 mg total) by mouth daily. . 90 tablet 3  . Melatonin 5 MG CAPS Take 1 capsule by mouth at bedtime.     . potassium  chloride (K-DUR) 10 MEQ tablet Take 1 tablet (10 mEq total) by mouth daily. 90 tablet 3  . simvastatin (ZOCOR) 20 MG tablet Take 10 mg by mouth daily.     . Glucosamine-Chondroit-Vit C-Mn (GLUCOSAMINE CHONDR 1500 COMPLX PO) Take 1 tablet by mouth 2 (two) times daily. Reported on 07/18/2016    . loperamide (IMODIUM A-D) 2 MG tablet Take 2 mg by mouth 4 (four) times daily as needed for diarrhea or loose stools. Reported on 07/18/2016    . mesalamine (LIALDA) 1.2 g EC tablet Take 2 tablets (2.4 g total) by mouth daily with breakfast. (Patient not taking: Reported on 07/18/2016) 30 tablet 0  . vancomycin (VANCOCIN) 50 mg/mL oral solution 125 mg QID x 10 days- (Patient not taking: Reported on 07/18/2016) 5000 mL 0   No facility-administered medications prior to visit.   Past Medical History  Diagnosis Date  . Anemia   . Arrhythmia   . Colon polyps   . Congestive heart failure ([place])   . Hyperlipidemia   . Hypertension   . UTI (lower urinary tract infection)   . Community acquired pneumonia Feb 2013    LLL  . Pleural effusion, right Feb 2013  . Osteoporosis   . SVT (supraventricular tachycardia) ([place]) feb 2013  . Cataract    Past Surgical History  Procedure Laterality Date  . Abdominal hysterectomy  1983  . Appendectomy  1983  . Colonoscopy    . Upper gastrointestinal endoscopy    . Hemiarthroplasty hip  2011    Left femoral neck fracture  . Colonoscopy N/A 06/10/2016    Procedure: COLONOSCOPY;  Surgeon: Manus Gunning, MD;  Location: [facility];  Service: Gastroenterology;  Laterality: N/A;  . Esophagogastroduodenoscopy N/A 06/10/2016    Procedure: ESOPHAGOGASTRODUODENOSCOPY (EGD);  Surgeon: Manus Gunning, MD;  Location: [facility];  Service: Gastroenterology;  Laterality: N/A;   Social History   Social History  . Marital Status: Single    Spouse Name: N/A  . Number of Children: 2  . Years of Education: N/A   Occupational History  . Retired    Social History  Main Topics  . Smoking status: Never Smoker   . Smokeless tobacco: Never Used  . Alcohol Use: No  . Drug Use: No  . Sexual Activity: Not Asked   Other Topics Concern  . None   Social History Narrative   Married, retired, 2 sons. Then the winter in [place].   Family History  Problem Relation Age of Onset  . Colon polyps Son   . Stroke Father    Review of Systems As per history of present illness    Objective:   Physical Exam BP 118/60 mmHg  Pulse 66  Ht _0  (1.6 m)  Wt 127 lb 6.4 oz (57.788 kg)  BMI 22.57 kg/m2  SpO2 98%  Supine 110/60 P 60 standingBlood pressure and pulse are the same     Assessment & Plan:   1. Colitis presumed infectious   2. Memory difficulties      F/u GI prn Resume reg diet Probiotic x 1 month Follow-up with primary care and/or neurology regarding her concerns about her memory and her head not feeling right. 15 minutes time spent with patient > half in counseling coordination of care I appreciate the opportunity to care for this patient. CC: Thressa Sheller, MD

## 2016-07-18 NOTE — Patient Instructions (Signed)
You may go off your low fiber diet.   Stop your probiotic after taking it for a month.    Follow up with Dr Carlean Purl as needed.     I appreciate the opportunity to care for you. Silvano Rusk, MD, [facility]

## 2016-07-18 NOTE — Assessment & Plan Note (Signed)
resolved

## 2016-08-07 DIAGNOSIS — E785 Hyperlipidemia, unspecified: Secondary | ICD-10-CM | POA: Diagnosis not present

## 2016-08-07 DIAGNOSIS — I129 Hypertensive chronic kidney disease with stage 1 through stage 4 chronic kidney disease, or unspecified chronic kidney disease: Secondary | ICD-10-CM | POA: Diagnosis not present

## 2016-08-07 DIAGNOSIS — M810 Age-related osteoporosis without current pathological fracture: Secondary | ICD-10-CM | POA: Diagnosis not present

## 2016-08-21 DIAGNOSIS — I10 Essential (primary) hypertension: Secondary | ICD-10-CM | POA: Diagnosis not present

## 2016-08-21 DIAGNOSIS — E785 Hyperlipidemia, unspecified: Secondary | ICD-10-CM | POA: Diagnosis not present

## 2016-08-21 DIAGNOSIS — I4891 Unspecified atrial fibrillation: Secondary | ICD-10-CM | POA: Diagnosis not present

## 2016-08-21 DIAGNOSIS — N183 Chronic kidney disease, stage 3 (moderate): Secondary | ICD-10-CM | POA: Diagnosis not present

## 2016-08-22 ENCOUNTER — Other Ambulatory Visit: Payer: Self-pay

## 2016-09-23 DIAGNOSIS — M810 Age-related osteoporosis without current pathological fracture: Secondary | ICD-10-CM | POA: Diagnosis not present

## 2016-09-29 DIAGNOSIS — M810 Age-related osteoporosis without current pathological fracture: Secondary | ICD-10-CM | POA: Diagnosis not present

## 2016-09-29 DIAGNOSIS — Z23 Encounter for immunization: Secondary | ICD-10-CM | POA: Diagnosis not present

## 2016-10-01 ENCOUNTER — Encounter: Payer: Self-pay | Admitting: Neurology

## 2016-10-01 ENCOUNTER — Telehealth: Payer: Self-pay | Admitting: Neurology

## 2016-10-01 ENCOUNTER — Ambulatory Visit (INDEPENDENT_AMBULATORY_CARE_PROVIDER_SITE_OTHER): Payer: Medicare Other | Admitting: Neurology

## 2016-10-01 VITALS — BP 134/69 | HR 70 | Ht 63.5 in | Wt 128.0 lb

## 2016-10-01 DIAGNOSIS — E538 Deficiency of other specified B group vitamins: Secondary | ICD-10-CM

## 2016-10-01 DIAGNOSIS — R413 Other amnesia: Secondary | ICD-10-CM

## 2016-10-01 HISTORY — DX: Other amnesia: R41.3

## 2016-10-01 MED ORDER — DONEPEZIL HCL 5 MG PO TABS: 5.0000 mg | ORAL_TABLET | Freq: Every day | ORAL | 3 refills | Status: DC

## 2016-10-01 NOTE — Telephone Encounter (Signed)
Pt called in confused about her appt today. She wants to know if she needs to start taking b12 for memory loss. Please call [phone]

## 2016-10-01 NOTE — Progress Notes (Signed)
Reason for visit: Altered mental status  Referring physician: Dr. [name] is a 82 y.o. female  History of present illness:  [name] is an 82 year old right-handed white female with a history of atrial fibrillation and pulmonary hypertension. The patient is on anticoagulant therapy. The patient went into the hospital around 06/06/2016 with diarrhea associated with colitis. She has had some problems with diarrhea 2 or 3 weeks prior to admission. The patient was placed on Cipro during that admission, she was found to have some confusion but responded to IV fluid therapy. The patient has reported a several year history of some problems with getting turned around with driving, and some difficulty with remembering names and word finding problems. She has had episodes of confusion where her house looked inverted where furniture she thought should have been in another area of the house. This was associated with the use of Percocet. She has improved with the confusion coming off the medication. The patient lives with her husband who also has significant problems with memory. The patient is able to keep up with her medications and appointments, she is able to manage the finances on her own. She denies any numbness or weakness of the face, arms, or legs with exception that she had an episode of numbness and shocking sensations in her right hand one week ago that has resolved. She reports some mild balance issues, but no falls. She denies issues controlling the bowels or the bladder. She is able to rest well at night. She is sent to this office for an evaluation.  Past Medical History:  Diagnosis Date  . Anemia   . Arrhythmia   . Cataract   . Colon polyps   . Community acquired pneumonia Feb 2013   LLL  . Congestive heart failure ([place])   . Hyperlipidemia   . Hypertension   . Osteoporosis   . Pleural effusion, right Feb 2013  . SVT (supraventricular tachycardia) ([place]) feb  2013  . UTI (lower urinary tract infection)     Past Surgical History:  Procedure Laterality Date  . ABDOMINAL HYSTERECTOMY  1983  . APPENDECTOMY  1983  . COLONOSCOPY    . COLONOSCOPY N/A 06/10/2016   Procedure: COLONOSCOPY;  Surgeon: Manus Gunning, MD;  Location: [facility];  Service: Gastroenterology;  Laterality: N/A;  . ESOPHAGOGASTRODUODENOSCOPY N/A 06/10/2016   Procedure: ESOPHAGOGASTRODUODENOSCOPY (EGD);  Surgeon: Manus Gunning, MD;  Location: [facility];  Service: Gastroenterology;  Laterality: N/A;  . HEMIARTHROPLASTY HIP  2011   Left femoral neck fracture  . UPPER GASTROINTESTINAL ENDOSCOPY      Family History  Problem Relation Age of Onset  . Colon polyps Son   . Stroke Father     Social history:  reports that she has never smoked. She has never used smokeless tobacco. She reports that she does not drink alcohol or use drugs.  Medications:  Prior to Admission medications   Medication Sig Start Date End Date Taking? Authorizing Provider  alendronate (FOSAMAX) 70 MG tablet  09/30/16  Yes Historical Provider, MD  atenolol (TENORMIN) 25 MG tablet Take 1 tablet (25 mg total) by mouth 2 (two) times daily. 11/02/15  Yes [name], MD  calcium-vitamin D (OSCAL WITH D) 500-200 MG-UNIT tablet Take 1 tablet by mouth 2 (two) times daily.   Yes Historical Provider, MD  ELIQUIS 5 MG TABS tablet TAKE 1 TABLET TWICE A DAY 06/24/16  Yes [name], MD  estradiol (ESTRACE) 1  MG tablet Take 0.5 mg by mouth every other day.  07/24/15  Yes Historical Provider, MD  furosemide (LASIX) 20 MG tablet Take 1 tablet (20 mg total) by mouth daily. . 11/02/15  Yes [name], MD  Melatonin 5 MG CAPS Take 1 capsule by mouth at bedtime.    Yes Historical Provider, MD  potassium chloride (K-DUR) 10 MEQ tablet Take 1 tablet (10 mEq total) by mouth daily. 11/02/15  Yes [name], MD  simvastatin (ZOCOR) 20 MG tablet Take 10 mg by mouth daily.    Yes Historical Provider, MD        Allergies  Allergen Reactions  . Sulfa Antibiotics Hives    Severe Rash    ROS:  Out of a complete 14 system review of symptoms, the patient complains only of the following symptoms, and all other reviewed systems are negative.  Memory loss, confusion Not enough sleep, decreased energy, disinterest in activities Insomnia, sleepiness  Blood pressure 134/69, pulse 70, height 5' 3.5" (1.613 m), weight 128 lb (58.1 kg).  Physical Exam  General: The patient is alert and cooperative at the time of the examination.  Eyes: Pupils are equal, round, and reactive to light. Discs are flat bilaterally.  Neck: The neck is supple, no carotid bruits are noted.  Respiratory: The respiratory examination is clear.  Cardiovascular: The cardiovascular examination reveals a regular rate and rhythm, no obvious murmurs or rubs are noted.  Skin: Extremities are without significant edema.  Neurologic Exam  Mental status: The patient is alert and oriented x 3 at the time of the examination. The patient has apparent normal recent and remote memory, with an apparently normal attention span and concentration ability. Mini-Mental Status Examination done today shows a total score 28/30.  Cranial nerves: Facial symmetry is present. There is good sensation of the face to pinprick and soft touch bilaterally. The strength of the facial muscles and the muscles to head turning and shoulder shrug are normal bilaterally. Speech is well enunciated, no aphasia or dysarthria is noted. Extraocular movements are full. Visual fields are full. The tongue is midline, and the patient has symmetric elevation of the soft palate. No obvious hearing deficits are noted.  Motor: The motor testing reveals 5 over 5 strength of all 4 extremities. Good symmetric motor tone is noted throughout.  Sensory: Sensory testing is intact to pinprick, soft touch, vibration sensation, and position sense on all 4 extremities. No evidence of  extinction is noted.  Coordination: Cerebellar testing reveals good finger-nose-finger and heel-to-shin bilaterally.  Gait and station: Gait is normal. Tandem gait is slightly unsteady. Romberg is negative. No drift is seen.  Reflexes: Deep tendon reflexes are symmetric and normal bilaterally. Toes are downgoing bilaterally.   Assessment/Plan:  1. Mild memory disturbance  The patient has had episodes of confusion associated with medications or intercurrent illness. The patient appears to have a baseline mild memory issue that is making her more sensitive to side effects from medication. The patient will be set up for MRI of the brain, she will have blood work today. A prescription for low-dose Aricept was given. She needs to watch out for diarrhea on the medication. She will follow-up in 6 months.  Jill Alexanders MD 10/01/2016 9:38 AM  [facility] [address] [place], [place] [address]  Phone [phone] Fax [phone]

## 2016-10-01 NOTE — Patient Instructions (Signed)
   Begin Aricept (donepezil) at 5 mg at night for one month. If this medication is well-tolerated, please call our office and we will call in a prescription for the 10 mg tablets. Look out for side effects that may include nausea, diarrhea, weight loss, or stomach cramps. This medication will also cause a runny nose, therefore there is no need for allergy medications for this purpose.

## 2016-10-01 NOTE — Telephone Encounter (Signed)
Returned pt TC. Let her know that her labs that were drawn today included a B12 level. We will call her back w/ results when available and give any recommendations for supplementation at that time. She did agree to go ahead and start Aricept that was prescribed today for memory. Also awaiting call to schedule MRI. She voiced understanding and appreciation for call.

## 2016-10-03 ENCOUNTER — Telehealth: Payer: Self-pay

## 2016-10-03 LAB — VITAMIN B12: Vitamin B-12: 495 pg/mL (ref 211–946)

## 2016-10-03 LAB — METHYLMALONIC ACID, SERUM: Methylmalonic Acid: 186 nmol/L (ref 0–378)

## 2016-10-03 LAB — RPR: RPR Ser Ql: NONREACTIVE

## 2016-10-03 NOTE — Telephone Encounter (Signed)
-----  Message from [name], MD sent at 10/03/2016 11:43 AM EDT -----   The blood work results are unremarkable. Please call the patient.  ----- Message ----- From: Lavone Neri Lab Results In Sent: 10/02/2016   7:44 AM To: [name], MD

## 2016-10-03 NOTE — Telephone Encounter (Signed)
Called w/ unremarkable lab results. May call back w/ additional questions/concerns.

## 2016-10-15 ENCOUNTER — Telehealth: Payer: Self-pay | Admitting: *Deleted

## 2016-10-16 NOTE — Telephone Encounter (Signed)
She just started donepezil 32m three days ago.  Per last note, Dr. WJannifer Franklinwould like her to take it for one month.  If it is well tolerated, she is to call our office and he will increase it to 116mdaily.  She verbalized understanding.

## 2016-10-29 DIAGNOSIS — Z1231 Encounter for screening mammogram for malignant neoplasm of breast: Secondary | ICD-10-CM | POA: Diagnosis not present

## 2016-10-29 DIAGNOSIS — Z803 Family history of malignant neoplasm of breast: Secondary | ICD-10-CM | POA: Diagnosis not present

## 2016-11-17 ENCOUNTER — Other Ambulatory Visit: Payer: Self-pay | Admitting: Internal Medicine

## 2016-12-01 ENCOUNTER — Telehealth: Payer: Self-pay | Admitting: Nurse Practitioner

## 2016-12-01 NOTE — Telephone Encounter (Signed)
Spoke to pt and she is having increase in her bowel movements (not diarrhea).  ?whether could be fosamax or donepezil that she started at the same time.  She has taken the fosamax before, but its been years.   I told her that GI issues are a common SE for donezepil.  She is hesitant to stop (she is taking 61m daily) and feels she has noted an improvement with her memory.  She will stop the fosamax for another week, and see if problem (since SE are similar).    If no change will then stop donepezil and see how she does. She does not have to taper off since she is on low dose.  She verbalized understanding. Will let u[place]ore[place]now how she does.  Has f/u 04/08/2017.

## 2016-12-01 NOTE — Telephone Encounter (Signed)
Patient called and requested to speak with someone regarding some of her medication. She has some questions about her aricept. She also states that she is having a lot of pain in her muscles and she has spoken with another doctor about some of her medications, this doctor has advised her to stop taking some of her medications and she wants to make sure this is ok with carolyn. Please call and advise.

## 2016-12-12 ENCOUNTER — Encounter: Payer: Self-pay | Admitting: Internal Medicine

## 2016-12-12 ENCOUNTER — Ambulatory Visit (INDEPENDENT_AMBULATORY_CARE_PROVIDER_SITE_OTHER): Payer: Medicare Other | Admitting: Internal Medicine

## 2016-12-12 VITALS — BP 122/70 | HR 70 | Ht 63.5 in | Wt 130.8 lb

## 2016-12-12 DIAGNOSIS — I482 Chronic atrial fibrillation: Secondary | ICD-10-CM | POA: Diagnosis not present

## 2016-12-12 DIAGNOSIS — E785 Hyperlipidemia, unspecified: Secondary | ICD-10-CM | POA: Diagnosis not present

## 2016-12-12 DIAGNOSIS — I5032 Chronic diastolic (congestive) heart failure: Secondary | ICD-10-CM | POA: Diagnosis not present

## 2016-12-12 DIAGNOSIS — I10 Essential (primary) hypertension: Secondary | ICD-10-CM

## 2016-12-12 DIAGNOSIS — I4821 Permanent atrial fibrillation: Secondary | ICD-10-CM

## 2016-12-12 MED ORDER — ATENOLOL 25 MG PO TABS: 25.0000 mg | ORAL_TABLET | Freq: Two times a day (BID) | ORAL | 11 refills | Status: DC

## 2016-12-12 NOTE — Patient Instructions (Signed)
Your physician recommends that you continue on your current medications as directed. Please refer to the Current Medication list given to you today. --take ATENOLOL at [facility] and BEDTIME  Your physician wants you to follow-up in: 9 months with Dr. Harrington Challenger.  You will receive a reminder letter in the mail two months in advance. If you don't receive a letter, please call our office to schedule the follow-up appointment.

## 2016-12-12 NOTE — Progress Notes (Signed)
Cardiology Office Note   Date:  12/12/2016   ID:  [name], [name] [birth year], MRN [id number]  PCP:  Thressa Sheller, MD  Cardiologist:   [name], MD   F?U of atrial fib      History of Present Illness: [name] Dyer is a 82 y.o. female with a history of atrial fib, HTN and HL  I saw her in March 2017    Pt having problems getting to sleep  Using melatonin   Needs probiotic because appetite down   Somach upset  Has questions about fosamax  Does say in [name] morning after meds she feels draggy  No signif dizziness    Outpatient Medications Prior to Visit  Medication Sig Dispense Refill  . alendronate (FOSAMAX) 70 MG tablet Take 70 mg by mouth once a week.     Marland Kitchen atenolol (TENORMIN) 25 MG tablet Take 1 tablet (25 mg total) by mouth 2 (two) times daily. 60 tablet 11  . calcium-vitamin D (OSCAL WITH D) 500-200 MG-UNIT tablet Take 1 tablet by mouth 2 (two) times daily.    Marland Kitchen donepezil (ARICEPT) 5 MG tablet Take 1 tablet (5 mg total) by mouth at bedtime. 30 tablet 3  . ELIQUIS 5 MG TABS tablet TAKE 1 TABLET TWICE A DAY 180 tablet 2  . furosemide (LASIX) 20 MG tablet Take 1 tablet (20 mg total) by mouth daily. . 90 tablet 3  . Melatonin 5 MG CAPS Take 1 capsule by mouth at bedtime.     . potassium chloride (K-DUR) 10 MEQ tablet Take 1 tablet (10 mEq total) by mouth daily. 90 tablet 0  . simvastatin (ZOCOR) 20 MG tablet Take 10 mg by mouth daily.     Marland Kitchen estradiol (ESTRACE) 1 MG tablet Take 0.5 mg by mouth every other day.      No facility-administered medications prior to visit.      Allergies:   Sulfa antibiotics   Past Medical History:  Diagnosis Date  . Anemia   . Arrhythmia   . Cataract   . Colon polyps   . Community acquired pneumonia Feb 2013   LLL  . Congestive heart failure ([place])   . Hyperlipidemia   . Hypertension   . Memory difficulty 10/01/2016  . Osteoporosis   . Pleural effusion, right Feb 2013  . SVT (supraventricular tachycardia) ([place]) feb  2013  . UTI (lower urinary tract infection)     Past Surgical History:  Procedure Laterality Date  . ABDOMINAL HYSTERECTOMY  1983  . APPENDECTOMY  1983  . COLONOSCOPY    . COLONOSCOPY N/A 06/10/2016   Procedure: COLONOSCOPY;  Surgeon: Manus Gunning, MD;  Location: [facility];  Service: Gastroenterology;  Laterality: N/A;  . ESOPHAGOGASTRODUODENOSCOPY N/A 06/10/2016   Procedure: ESOPHAGOGASTRODUODENOSCOPY (EGD);  Surgeon: Manus Gunning, MD;  Location: [facility];  Service: Gastroenterology;  Laterality: N/A;  . HEMIARTHROPLASTY HIP  2011   Left femoral neck fracture  . UPPER GASTROINTESTINAL ENDOSCOPY       Social History:  [name] patient  reports that she has never smoked. She has never used smokeless tobacco. She reports that she does not drink alcohol or use drugs.   Family History:  [name] patient's family history includes Colon polyps in her son; Stroke in her father.    ROS:  Please see [name] history of present illness. All other systems are reviewed and  Negative to [name] above problem except as noted.    PHYSICAL EXAM: VS:  BP 122/70  Pulse 70   Ht 5' 3.5" (1.613 m)   Wt 130 lb 12.8 oz (59.3 kg)   BMI 22.81 kg/m   GEN: Well nourished, well developed, in no acute distress  HEENT: normal  Neck: no JVD, carotid bruits, or masses Cardiac: RRR; no murmurs, rubs, or gallops,no edema  Respiratory:  clear to auscultation bilaterally, normal work of breathing GI: soft, nontender, nondistended, + BS  No hepatomegaly  MS: no deformity Moving all extremities   Skin: warm and dry, no rash Neuro:  Strength and sensation are intact Psych: euthymic mood, full affect   EKG:  EKG is ordered today.  Atrail fib  70     Lipid Panel No results found for: CHOL, TRIG, HDL, CHOLHDL, VLDL, LDLCALC, LDLDIRECT    Wt Readings from Last 3 Encounters:  12/12/16 130 lb 12.8 oz (59.3 kg)  10/01/16 128 lb (58.1 kg)  07/18/16 127 lb 6.4 oz (57.8 kg)      ASSESSMENT AND  PLAN:  1  Atrial fib  Permanent   Rates controlled  Keep on same meds  Asymptomatic  Check labs     2  AM sluggishness  Recomm she take atenolol at [place].  3  HTN  Continue current meds    Pt with multiple questions re sleep and fosamax and aricept today  Also interested in probiiotic   Had diarrhea on one   Primary MD [place] and   [place]    F/U in 9 months    Signed, [name], MD  12/12/2016 9:[address], [place], [place]  [address] Phone: [phone]; Fax: [phone]

## 2017-01-08 DIAGNOSIS — G629 Polyneuropathy, unspecified: Secondary | ICD-10-CM | POA: Diagnosis not present

## 2017-01-28 ENCOUNTER — Other Ambulatory Visit: Payer: Self-pay | Admitting: Internal Medicine

## 2017-02-23 DIAGNOSIS — Z0000 Encounter for general adult medical examination without abnormal findings: Secondary | ICD-10-CM | POA: Diagnosis not present

## 2017-02-23 DIAGNOSIS — I10 Essential (primary) hypertension: Secondary | ICD-10-CM | POA: Diagnosis not present

## 2017-02-23 DIAGNOSIS — E559 Vitamin D deficiency, unspecified: Secondary | ICD-10-CM | POA: Diagnosis not present

## 2017-02-23 DIAGNOSIS — E785 Hyperlipidemia, unspecified: Secondary | ICD-10-CM | POA: Diagnosis not present

## 2017-02-25 ENCOUNTER — Telehealth: Payer: Self-pay | Admitting: Internal Medicine

## 2017-02-25 NOTE — Telephone Encounter (Signed)
New message    Pt c/o swelling: STAT is pt has developed SOB within 24 hours  1. How long have you been experiencing swelling? Last week  2. Where is the swelling located? legs  3.  Are you currently taking a "fluid pill"? yes  4.  Are you currently SOB? Just some-she says it happens most when she bends down to pick something up  5.  Have you traveled recently? Yes, last week. Pt traveled to [place], and states she may have missed a dose or two.   Pt states someone told her to take one and a half lasix for two days-she says she feels lightheaded. She also says she has sores in her mouth.

## 2017-02-25 NOTE — Telephone Encounter (Signed)
Patient states that she has had some slight swelling in her legs and SOB when she bends down to pick something up for the past week. She states that she has been a little light headed and dizzy.The patient states that she went to [place] this last week and forgot to take her furosemide a few times while she was gone. She is back home now. The patient takes 20 mg of furosemide daily.  She states that she was at another doctors office the other day and they told her to take an additional 10 mg (1/2 pill) a day. The patient said that she did this the past two days and today she is taking her normal dose. The patient states that she feels like she is getting better and just wanted to let us know.   The patient also states that the last 2-3 days she has developed sores in her mouth that are on her gums, the side and roof of her mouth. The patient was advised to follow up with her PCP regarding this issue.   The patient was notified that I would forward the message to Dr. Harrington Challenger for review. Patient advised to contact us if her SOB worsens. Patient verbalized understanding and was in agreement with this plan.

## 2017-02-26 NOTE — Telephone Encounter (Signed)
If breathing does not get back to baseline by next week  Call back  May need labs then and eval

## 2017-02-26 NOTE — Telephone Encounter (Signed)
Informed patient.  She is improving.  She has gone back to taking atenolol in the morning because she cant remember to take it at noon.  Discussed ways to try to remember to take medications as they are important to control her symptoms.  She will call back next week if she is not back to baseline.

## 2017-03-05 ENCOUNTER — Other Ambulatory Visit: Payer: Self-pay | Admitting: Internal Medicine

## 2017-03-05 MED ORDER — APIXABAN 2.5 MG PO TABS: 2.5000 mg | ORAL_TABLET | Freq: Two times a day (BID) | ORAL | 1 refills | Status: DC

## 2017-03-05 NOTE — Telephone Encounter (Signed)
Pt last seen by Dr Harrington Challenger 12/17. Weight 59.3Kg, age 29yr, SCr 0.75 05/2016. She is currently on 576mBID but with the specified criteria she should be on 2.74m63mID. Thus telephoned pt and discussed this She will start taking 1/2 tablet of the 74mg19mD until her new Rx for 2.74mg 574m received. Pt verbalizes understanding that her dosage should be 2.74mg B174m

## 2017-03-16 DIAGNOSIS — I129 Hypertensive chronic kidney disease with stage 1 through stage 4 chronic kidney disease, or unspecified chronic kidney disease: Secondary | ICD-10-CM | POA: Diagnosis not present

## 2017-03-16 DIAGNOSIS — R7989 Other specified abnormal findings of blood chemistry: Secondary | ICD-10-CM | POA: Diagnosis not present

## 2017-03-16 DIAGNOSIS — M810 Age-related osteoporosis without current pathological fracture: Secondary | ICD-10-CM | POA: Diagnosis not present

## 2017-03-16 DIAGNOSIS — R945 Abnormal results of liver function studies: Secondary | ICD-10-CM | POA: Diagnosis not present

## 2017-03-16 DIAGNOSIS — R5383 Other fatigue: Secondary | ICD-10-CM | POA: Diagnosis not present

## 2017-03-16 DIAGNOSIS — I4891 Unspecified atrial fibrillation: Secondary | ICD-10-CM | POA: Diagnosis not present

## 2017-03-16 DIAGNOSIS — N183 Chronic kidney disease, stage 3 (moderate): Secondary | ICD-10-CM | POA: Diagnosis not present

## 2017-03-17 DIAGNOSIS — E785 Hyperlipidemia, unspecified: Secondary | ICD-10-CM | POA: Diagnosis not present

## 2017-03-23 ENCOUNTER — Telehealth: Payer: Self-pay | Admitting: Neurology

## 2017-03-23 MED ORDER — DONEPEZIL HCL 10 MG PO TABS: 10.0000 mg | ORAL_TABLET | Freq: Every day | ORAL | 3 refills | Status: DC

## 2017-03-23 NOTE — Telephone Encounter (Signed)
Aricept will be increased to 10 mg at night.

## 2017-03-23 NOTE — Telephone Encounter (Signed)
Called and spoke with pt. She is doing well on 86m tablet aricept. Having no side effects. Advised this medication helps slow down process of memory loss, does not improve memory. She is willing to increase to 147mtablet. Advised most common side effects GI upset. She will call if she experiences any SE. She would like rx [facility] She only has 2 tablets aricept 11m32mablet left.

## 2017-03-23 NOTE — Telephone Encounter (Signed)
Patient called office requesting refill for donepezil (ARICEPT) 5 MG tablet. [place].

## 2017-03-23 NOTE — Addendum Note (Signed)
Addended by: [name] on: 03/23/2017 06:17 PM   Modules accepted: Orders

## 2017-04-08 ENCOUNTER — Encounter (INDEPENDENT_AMBULATORY_CARE_PROVIDER_SITE_OTHER): Payer: Self-pay

## 2017-04-08 ENCOUNTER — Encounter: Payer: Self-pay | Admitting: Nurse Practitioner

## 2017-04-08 ENCOUNTER — Telehealth: Payer: Self-pay | Admitting: Neurology

## 2017-04-08 ENCOUNTER — Ambulatory Visit (INDEPENDENT_AMBULATORY_CARE_PROVIDER_SITE_OTHER): Payer: Medicare Other | Admitting: Nurse Practitioner

## 2017-04-08 VITALS — BP 127/69 | HR 70 | Wt 132.2 lb

## 2017-04-08 DIAGNOSIS — R413 Other amnesia: Secondary | ICD-10-CM | POA: Diagnosis not present

## 2017-04-08 MED ORDER — ALPRAZOLAM 0.5 MG PO TABS: ORAL_TABLET | ORAL | 0 refills | Status: DC

## 2017-04-08 NOTE — Telephone Encounter (Signed)
Spoke with patient and advised her that Xanax prescription has been sent to [facility]. She asked about how to take. Advised she follow directions on prescription and gave her verbal instructions.   Advised her she cannot drive when taking this medication. Patient stated she has no other means; her husband cannot drive. She has no friends , neighbors that can take her for MRI. She stated her son may be able to. She stated she will have to change her MRI appointment so her son can take her. This RN stated message will have to be sent back to [facility] to call patient and reschedule. Patient verbalized understanding.

## 2017-04-08 NOTE — Patient Instructions (Addendum)
Will set up for MRI of the brain order already in the system Continue Aricept for memory Labs for treatable causes of memory loss returned normal.  Memory score is stable Follow up 6 months

## 2017-04-08 NOTE — Telephone Encounter (Signed)
The patient is claustrophic and may need meds to calm her.  She is scheduled to have her MRI tomorrow 04/09/17 at [place] at 3:15 pm.

## 2017-04-08 NOTE — Telephone Encounter (Signed)
I spoke to the patient and informed her that I don't reschedule [facility] appointment and I gave her their phone number before she left here. She is going to call them and reschedule for a time her son can go with her.Marland Kitchen

## 2017-04-08 NOTE — Telephone Encounter (Signed)
Noted

## 2017-04-08 NOTE — Progress Notes (Signed)
[facility]  PATIENT: [name] Dyer DOB: [birth year]   REASON FOR VISIT: Follow-up for memory loss,  HISTORY FROM: Patient    HISTORY OF PRESENT ILLNESS: [name] Dyer, 83 year old female returns for follow-up with history of memory loss. Labs for B12 and RPR returned normal. She did not obtain MRI of the brain as requested. She has a 2-3 year history of problems getting around and driving and difficulty remembering names. She denies any numbness or weakness of the face arms or legs. She reports some mild balance issues but she has not had any falls. She is not having problems controlling her bowel or bladder. She was started on Aricept and is currently tolerating 10 mg daily without side effects and she feels her memory is better. She returns for reevaluation   10/01/16 [name]. Record is an 83 year old right-handed white female with a history of atrial fibrillation and pulmonary hypertension. The patient is on anticoagulant therapy. The patient went into the hospital around 06/06/2016 with diarrhea associated with colitis. She has had some problems with diarrhea 2 or 3 weeks prior to admission. The patient was placed on Cipro during that admission, she was found to have some confusion but responded to IV fluid therapy. The patient has reported a several year history of some problems with getting turned around with driving, and some difficulty with remembering names and word finding problems. She has had episodes of confusion where her house looked inverted where furniture she thought should have been in another area of the house. This was associated with the use of Percocet. She has improved with the confusion coming off the medication. The patient lives with her husband who also has significant problems with memory. The patient is able to keep up with her medications and appointments, she is able to manage the finances on her own. She denies any numbness or weakness of the  face, arms, or legs with exception that she had an episode of numbness and shocking sensations in her right hand one week ago that has resolved. She reports some mild balance issues, but no falls. She denies issues controlling the bowels or the bladder. She is able to rest well at night. She is sent to this office for an evaluation.   REVIEW OF SYSTEMS: Full 14 system review of systems performed and notable only for those listed, all others are neg:  Constitutional: neg  Cardiovascular: neg Ear/Nose/Throat: neg  Skin: neg Eyes: Blurred vision Respiratory: neg Gastroitestinal: neg  Hematology/Lymphatic: neg  Endocrine: neg Musculoskeletal: Joint pain Allergy/Immunology: neg Neurological: Memory loss Psychiatric: neg Sleep : neg   ALLERGIES: Allergies  Allergen Reactions  . Sulfa Antibiotics Hives    Severe Rash    HOME MEDICATIONS: Outpatient Medications Prior to Visit  Medication Sig Dispense Refill  . alendronate (FOSAMAX) 70 MG tablet Take 70 mg by mouth once a week.     Marland Kitchen apixaban (ELIQUIS) 2.5 MG TABS tablet Take 1 tablet (2.5 mg total) by mouth 2 (two) times daily. 180 tablet 1  . atenolol (TENORMIN) 25 MG tablet Take 1 tablet (25 mg total) by mouth 2 (two) times daily. Noon and bedtime 60 tablet 11  . B Complex Vitamins (VITAMIN B COMPLEX PO) Take 1 tablet by mouth daily.    . calcium-vitamin D (OSCAL WITH D) 500-200 MG-UNIT tablet Take 1 tablet by mouth 2 (two) times daily.    Marland Kitchen donepezil (ARICEPT) 10 MG tablet Take 1 tablet (10 mg total) by mouth at bedtime. 90 tablet  3  . furosemide (LASIX) 20 MG tablet Take 1 tablet (20 mg total) by mouth daily. . 90 tablet 3  . Melatonin 5 MG CAPS Take 1 capsule by mouth at bedtime.     . potassium chloride (K-DUR,KLOR-CON) 10 MEQ tablet TAKE 1 TABLET DAILY 90 tablet 3  . simvastatin (ZOCOR) 20 MG tablet Take 10 mg by mouth daily.      No facility-administered medications prior to visit.     PAST MEDICAL HISTORY: Past Medical  History:  Diagnosis Date  . Anemia   . Arrhythmia   . Cataract   . Colon polyps   . Community acquired pneumonia Feb 2013   LLL  . Congestive heart failure ([place])   . Hyperlipidemia   . Hypertension   . Memory difficulty 10/01/2016  . Osteoporosis   . Pleural effusion, right Feb 2013  . SVT (supraventricular tachycardia) ([place]) feb 2013  . UTI (lower urinary tract infection)     PAST SURGICAL HISTORY: Past Surgical History:  Procedure Laterality Date  . ABDOMINAL HYSTERECTOMY  1983  . APPENDECTOMY  1983  . COLONOSCOPY    . COLONOSCOPY N/A 06/10/2016   Procedure: COLONOSCOPY;  Surgeon: Manus Gunning, MD;  Location: [facility];  Service: Gastroenterology;  Laterality: N/A;  . ESOPHAGOGASTRODUODENOSCOPY N/A 06/10/2016   Procedure: ESOPHAGOGASTRODUODENOSCOPY (EGD);  Surgeon: Manus Gunning, MD;  Location: [facility];  Service: Gastroenterology;  Laterality: N/A;  . HEMIARTHROPLASTY HIP  2011   Left femoral neck fracture  . UPPER GASTROINTESTINAL ENDOSCOPY      FAMILY HISTORY: Family History  Problem Relation Age of Onset  . Colon polyps Son   . Stroke Father     SOCIAL HISTORY: Social History   Social History  . Marital status: Single    Spouse name: N/A  . Number of children: 2  . Years of education: 12   Occupational History  . Retired    Social History Main Topics  . Smoking status: Never Smoker  . Smokeless tobacco: Never Used  . Alcohol use No  . Drug use: No  . Sexual activity: Not on file   Other Topics Concern  . Not on file   Social History Narrative   Married, retired, 2 sons. Husband is disabled, non-ambulatory. Spends the the winters in [place].   Right-handed   Caffeine: Cheerwine or hot chocolate occasionally but otherwise none     PHYSICAL EXAM  Vitals:   04/08/17 1038  BP: 127/69  Pulse: 70  Weight: 132 lb 3.2 oz (60 kg)   Body mass index is 23.05 kg/m.  Generalized: Well developed, in no acute distress    Head: normocephalic and atraumatic,. Oropharynx benign  Neck: Supple, no carotid bruits  Cardiac: Regular rate rhythm, no murmur  Musculoskeletal: No deformity   Neurological examination   Mentation: Alert oriented to time, place, history taking.AFT 10. Clock drawing 4/4 MMSE - Mini Mental State Exam 04/08/2017 10/01/2016  Orientation to time 5 5  Orientation to Place 5 5  Registration 3 3  Attention/ Calculation 5 3  Recall 3 3  Language- name 2 objects 2 2  Language- repeat 0 1  Language- follow 3 step command 2 3  Language- read & follow direction 1 1  Write a sentence 1 1  Copy design 0 1  Total score 27 28  Follows all commands speech and language fluent.   Cranial nerve II-XII: .Pupils were equal round reactive to light extraocular movements were full, visual field were full  on confrontational test. Facial sensation and strength were normal. hearing was intact to finger rubbing bilaterally. Uvula tongue midline. head turning and shoulder shrug were normal and symmetric.Tongue protrusion into cheek strength was normal. Motor: normal bulk and tone, full strength in the BUE, BLE, fine finger movements normal, no pronator drift. No focal weakness Sensory: normal and symmetric to light touch, pinprick, and  Vibration, In the upper and lower extremities Coordination: finger-nose-finger, heel-to-shin bilaterally, no dysmetria Reflexes: Brachioradialis 2/2, biceps 2/2, triceps 2/2, patellar 2/2, Achilles 2/2, plantar responses were flexor bilaterally. Gait and Station: Rising up from seated position without assistance, normal stance,  moderate stride, good arm swing, smooth turning, able to perform tiptoe, and heel walking without difficulty. Tandem gait is mildly unsteady  DIAGNOSTIC DATA (LABS, IMAGING, TESTING) - I reviewed patient records, labs, notes, testing and imaging myself where available.  Lab Results  Component Value Date   WBC 10.8 (H) 06/12/2016   HGB 11.6 (L)  06/12/2016   HCT 35.8 (L) 06/12/2016   MCV 87.1 06/12/2016   PLT  06/12/2016    PLATELET CLUMPS NOTED ON SMEAR, COUNT APPEARS ADEQUATE      Component Value Date/Time   NA 140 06/12/2016 0523   K 3.9 06/12/2016 0523   CL 114 (H) 06/12/2016 0523   CO2 19 (L) 06/12/2016 0523   GLUCOSE 103 (H) 06/12/2016 0523   BUN 14 06/12/2016 0523   CREATININE 0.75 06/12/2016 0523   CREATININE 0.72 03/16/2015 1717   CALCIUM 8.5 (L) 06/12/2016 0523   PROT 5.3 (L) 06/07/2016 0529   ALBUMIN 2.7 (L) 06/07/2016 0529   AST 24 06/07/2016 0529   ALT 18 06/07/2016 0529   ALKPHOS 53 06/07/2016 0529   BILITOT 0.7 06/07/2016 0529   GFRNONAA >60 06/12/2016 0523   GFRAA >60 06/12/2016 0523    Lab Results  Component Value Date   VITAMINB12 495 10/01/2016   Lab Results  Component Value Date   TSH 3.39 04/26/2015      ASSESSMENT AND PLAN  83 y.o. year old female  has a past medical history of Anemia; Arrhythmia;  Hyperlipidemia; Hypertension; Memory difficulty (10/01/2016); here to follow up for memory loss   Will set up for MRI of the brain order already in the system to assess for dementia We will give Xanax  prior to procedure Continue Aricept for memory at 10 mg daily Labs for treatable causes of memory loss returned normal. Memory score is stable  Follow up 6 months I spent 25 min in total face to face time with the patient more than 50% of which was spent counseling and coordination of care, reviewing test results reviewing medications and discussing and reviewing the diagnosis of memory loss and the reasoning behind getting MRI to document her disease process and further treatment options and additional medications that can be added at a later date.  Dennie Bible, [facility], [facility], APRN  [facility] [address] [place], [place] [address] [phone]

## 2017-04-08 NOTE — Progress Notes (Signed)
I have read the note, and I agree with the clinical assessment and plan.  Trelyn Vanderlinde KEITH   

## 2017-04-09 ENCOUNTER — Other Ambulatory Visit: Payer: Medicare Other

## 2017-04-19 ENCOUNTER — Other Ambulatory Visit: Payer: Medicare Other

## 2017-04-20 ENCOUNTER — Ambulatory Visit
Admission: RE | Admit: 2017-04-20 | Discharge: 2017-04-20 | Disposition: A | Discharge status: Home / Self Care | Payer: Medicare Other | Source: Ambulatory Visit | Attending: Neurology | Admitting: Neurology

## 2017-04-20 DIAGNOSIS — R413 Other amnesia: Secondary | ICD-10-CM | POA: Diagnosis not present

## 2017-04-22 ENCOUNTER — Telehealth: Payer: Self-pay | Admitting: Neurology

## 2017-04-22 NOTE — Telephone Encounter (Signed)
I called patient. MRI the brain does show a moderate level of small vessel ischemic changes. This may be enough to contribute some to the memory issues, the patient is on anticoagulant therapy, and she is on Aricept.  MRI brain 04/22/17:  IMPRESSION:  Abnormal MRI brain (without) demonstrating: 1. Moderate periventricular and subcortical chronic small vessel ischemic disease. 2. 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 3. Mild diffuse and moderate perisylvian atrophy. 4. No acute findings.

## 2017-04-29 NOTE — Telephone Encounter (Signed)
I called the patient. I went over the results of the MRI the brain with her again. She claims that she is not feeling well at times, it may be related to her gabapentin dosing, she is to cut back to taking 1 tablet in the evening, not take the morning dose. She will call me if this does not help.

## 2017-04-29 NOTE — Telephone Encounter (Signed)
Pt would like a call back explaining in greater detail the findings of the MRI and what to expect

## 2017-04-30 DIAGNOSIS — M1990 Unspecified osteoarthritis, unspecified site: Secondary | ICD-10-CM | POA: Diagnosis not present

## 2017-04-30 DIAGNOSIS — M25562 Pain in left knee: Secondary | ICD-10-CM | POA: Diagnosis not present

## 2017-04-30 DIAGNOSIS — M25561 Pain in right knee: Secondary | ICD-10-CM | POA: Diagnosis not present

## 2017-04-30 DIAGNOSIS — M810 Age-related osteoporosis without current pathological fracture: Secondary | ICD-10-CM | POA: Diagnosis not present

## 2017-04-30 DIAGNOSIS — N189 Chronic kidney disease, unspecified: Secondary | ICD-10-CM | POA: Diagnosis not present

## 2017-05-18 ENCOUNTER — Telehealth: Payer: Self-pay | Admitting: Neurology

## 2017-05-18 ENCOUNTER — Other Ambulatory Visit: Payer: Self-pay | Admitting: *Deleted

## 2017-05-18 ENCOUNTER — Telehealth: Payer: Self-pay | Admitting: *Deleted

## 2017-05-18 MED ORDER — GABAPENTIN 300 MG PO CAPS
300.0000 mg | ORAL_CAPSULE | Freq: Every day | ORAL | 2 refills | Status: DC
Start: 2017-05-18 — End: 2018-02-25

## 2017-05-18 MED ORDER — DONEPEZIL HCL 10 MG PO TABS: 10.0000 mg | ORAL_TABLET | Freq: Every day | ORAL | 1 refills | Status: DC

## 2017-05-18 NOTE — Telephone Encounter (Signed)
A refill for gabapentin was given taking 1 capsule at night.

## 2017-05-18 NOTE — Telephone Encounter (Signed)
Patient called and requested a refill on rx [facility] She states the pharmacy is waiting on our office to send the script over to them .

## 2017-05-18 NOTE — Telephone Encounter (Signed)
E-scribed refills to patient pharmacy as requested. Patient has f/u on 10/08/17.

## 2017-05-18 NOTE — Telephone Encounter (Signed)
Received refill request for rx gabapentin 323m capsules from [facility].

## 2017-05-22 NOTE — Telephone Encounter (Signed)
Tried calling patient back. Need some clarification. Got busy signal. Will try again later.

## 2017-05-22 NOTE — Telephone Encounter (Signed)
[facility] They stated rx shipped to patient already. Unsure of what patient was referring to.

## 2017-05-22 NOTE — Telephone Encounter (Signed)
Patient called office in reference to receiving a call from [place] last night questioning if patient is going to be taking Gabapentin (name brand) or Neurontin (generic) Please call [facility] at [phone] to verify.

## 2017-06-05 DIAGNOSIS — M5136 Other intervertebral disc degeneration, lumbar region: Secondary | ICD-10-CM | POA: Diagnosis not present

## 2017-06-05 DIAGNOSIS — M1990 Unspecified osteoarthritis, unspecified site: Secondary | ICD-10-CM | POA: Diagnosis not present

## 2017-06-05 DIAGNOSIS — M545 Low back pain: Secondary | ICD-10-CM | POA: Diagnosis not present

## 2017-06-05 DIAGNOSIS — M25561 Pain in right knee: Secondary | ICD-10-CM | POA: Diagnosis not present

## 2017-07-08 DIAGNOSIS — H26491 Other secondary cataract, right eye: Secondary | ICD-10-CM | POA: Diagnosis not present

## 2017-07-08 DIAGNOSIS — Z961 Presence of intraocular lens: Secondary | ICD-10-CM | POA: Diagnosis not present

## 2017-07-08 DIAGNOSIS — H35363 Drusen (degenerative) of macula, bilateral: Secondary | ICD-10-CM | POA: Diagnosis not present

## 2017-07-08 DIAGNOSIS — H35033 Hypertensive retinopathy, bilateral: Secondary | ICD-10-CM | POA: Diagnosis not present

## 2017-08-03 DIAGNOSIS — E785 Hyperlipidemia, unspecified: Secondary | ICD-10-CM | POA: Diagnosis not present

## 2017-08-03 DIAGNOSIS — H269 Unspecified cataract: Secondary | ICD-10-CM | POA: Diagnosis not present

## 2017-08-03 DIAGNOSIS — N183 Chronic kidney disease, stage 3 (moderate): Secondary | ICD-10-CM | POA: Diagnosis not present

## 2017-08-03 DIAGNOSIS — M179 Osteoarthritis of knee, unspecified: Secondary | ICD-10-CM | POA: Diagnosis not present

## 2017-08-26 ENCOUNTER — Telehealth: Payer: Self-pay | Admitting: *Deleted

## 2017-08-26 NOTE — Telephone Encounter (Signed)
Prior authorization for patient ELIQUIS completed and will await signature from Dr Harrington Challenger, she works tomorrow I will place in her [place].

## 2017-09-01 NOTE — Telephone Encounter (Signed)
I have faxed Eliquis PA to [facility].

## 2017-09-02 NOTE — Telephone Encounter (Signed)
Received an approval letter via fax from [employer] stating that the pts Eliquis PA has been approved from 08/02/17 until 09/01/18. Case [id number]

## 2017-09-09 DIAGNOSIS — E559 Vitamin D deficiency, unspecified: Secondary | ICD-10-CM | POA: Diagnosis not present

## 2017-09-09 DIAGNOSIS — N390 Urinary tract infection, site not specified: Secondary | ICD-10-CM | POA: Diagnosis not present

## 2017-09-09 DIAGNOSIS — I10 Essential (primary) hypertension: Secondary | ICD-10-CM | POA: Diagnosis not present

## 2017-09-09 DIAGNOSIS — E7800 Pure hypercholesterolemia, unspecified: Secondary | ICD-10-CM | POA: Diagnosis not present

## 2017-09-16 DIAGNOSIS — I4891 Unspecified atrial fibrillation: Secondary | ICD-10-CM | POA: Diagnosis not present

## 2017-09-16 DIAGNOSIS — M810 Age-related osteoporosis without current pathological fracture: Secondary | ICD-10-CM | POA: Diagnosis not present

## 2017-09-16 DIAGNOSIS — N183 Chronic kidney disease, stage 3 (moderate): Secondary | ICD-10-CM | POA: Diagnosis not present

## 2017-09-16 DIAGNOSIS — I10 Essential (primary) hypertension: Secondary | ICD-10-CM | POA: Diagnosis not present

## 2017-09-16 DIAGNOSIS — E785 Hyperlipidemia, unspecified: Secondary | ICD-10-CM | POA: Diagnosis not present

## 2017-09-16 DIAGNOSIS — Z23 Encounter for immunization: Secondary | ICD-10-CM | POA: Diagnosis not present

## 2017-09-21 DIAGNOSIS — H6122 Impacted cerumen, left ear: Secondary | ICD-10-CM | POA: Diagnosis not present

## 2017-10-08 ENCOUNTER — Ambulatory Visit: Payer: Medicare Other | Admitting: Nurse Practitioner

## 2017-10-13 ENCOUNTER — Telehealth: Payer: Self-pay | Admitting: Internal Medicine

## 2017-10-13 NOTE — Telephone Encounter (Signed)
New message    Pt is calling asking for a call back.   Pt c/o swelling: STAT is pt has developed SOB within 24 hours  1) How much weight have you gained and in what time span? Maybe a pound or so per pt.  2) If swelling, where is the swelling located? Legs-pt states that her legs are tight.  3) Are you currently taking a fluid pill? Yes-pt states that she upped her lasix.  4) Are you currently SOB? No   5) Do you have a log of your daily weights (if so, list)? She said she logs it but it's up and down.  6) Have you gained 3 pounds in a day or 5 pounds in a week? No   7) Have you traveled recently? No

## 2017-10-13 NOTE — Telephone Encounter (Signed)
Patient caring for husband who is ill, they run to many doctor appointments. He can't be left alone. Ankles swollen for past 3 days. No SOB, weight stable: 125 lbs.  Legs feel tight. Advised to continue daily weights.  Aware to call if weight changes 3#overnight, 5#in a week. She took lasix 40 mg today (and 28mq potassium) and had more urine output than normal. Advised to take double of lasix and potassium again tomorrow and elevate legs whenever possible, choose foods low in salt.  Today she ate BBQ sandwich yesterday Ramen noodles. Aware I am forwarding to Dr. RHarrington Challengerfor further recommendation.  Advised patient to call back if swelling gets worse. Has f/u OV 11/5.

## 2017-10-15 NOTE — Telephone Encounter (Signed)
Swelling got better, legs less tight.  Advised to watch salt.  She has trouble trying to eat on the go and so sill has to work on reducing sodium.

## 2017-10-15 NOTE — Telephone Encounter (Signed)
I agree with recommendations. Go back to regular dosing after today Watch salt. Keep appt on 11/5

## 2017-10-16 ENCOUNTER — Encounter: Payer: Self-pay | Admitting: Internal Medicine

## 2017-10-25 ENCOUNTER — Emergency Department (HOSPITAL_COMMUNITY): Payer: Medicare Other

## 2017-10-25 ENCOUNTER — Emergency Department (HOSPITAL_COMMUNITY)
Admission: EM | Admit: 2017-10-25 | Discharge: 2017-10-25 | Disposition: A | Discharge status: Home / Self Care | Payer: Medicare Other | Attending: Emergency Medicine | Admitting: Emergency Medicine

## 2017-10-25 ENCOUNTER — Encounter (HOSPITAL_COMMUNITY): Payer: Self-pay

## 2017-10-25 DIAGNOSIS — I482 Chronic atrial fibrillation: Secondary | ICD-10-CM | POA: Diagnosis not present

## 2017-10-25 DIAGNOSIS — G479 Sleep disorder, unspecified: Secondary | ICD-10-CM

## 2017-10-25 DIAGNOSIS — I5032 Chronic diastolic (congestive) heart failure: Secondary | ICD-10-CM | POA: Diagnosis not present

## 2017-10-25 DIAGNOSIS — Z79899 Other long term (current) drug therapy: Secondary | ICD-10-CM | POA: Insufficient documentation

## 2017-10-25 DIAGNOSIS — I110 Hypertensive heart disease with heart failure: Secondary | ICD-10-CM | POA: Insufficient documentation

## 2017-10-25 DIAGNOSIS — R002 Palpitations: Secondary | ICD-10-CM | POA: Insufficient documentation

## 2017-10-25 DIAGNOSIS — G4700 Insomnia, unspecified: Secondary | ICD-10-CM | POA: Insufficient documentation

## 2017-10-25 DIAGNOSIS — J90 Pleural effusion, not elsewhere classified: Secondary | ICD-10-CM | POA: Diagnosis not present

## 2017-10-25 DIAGNOSIS — Z7901 Long term (current) use of anticoagulants: Secondary | ICD-10-CM | POA: Diagnosis not present

## 2017-10-25 LAB — BASIC METABOLIC PANEL
Anion gap: 8 (ref 5–15)
BUN: 19 mg/dL (ref 6–20)
CO2: 25 mmol/L (ref 22–32)
Calcium: 8.8 mg/dL — ABNORMAL LOW (ref 8.9–10.3)
Chloride: 108 mmol/L (ref 101–111)
Creatinine, Ser: 0.82 mg/dL (ref 0.44–1.00)
GFR calc Af Amer: >60 mL/min (ref >60)
GFR calc non Af Amer: >60 mL/min (ref >60)
Glucose, Bld: 96 mg/dL (ref 65–99)
Potassium: 4.5 mmol/L (ref 3.5–5.1)
Sodium: 141 mmol/L (ref 135–145)

## 2017-10-25 LAB — CBC
HCT: 39.5 % (ref 36.0–46.0)
Hemoglobin: 12.9 g/dL (ref 12.0–15.0)
MCH: 31.2 pg (ref 26.0–34.0)
MCHC: 32.7 g/dL (ref 30.0–36.0)
MCV: 95.6 fL (ref 78.0–100.0)
Platelets: UNDETERMINED 10*3/uL (ref 150–400)
RBC: 4.13 MIL/uL (ref 3.87–5.11)
RDW: 14.5 % (ref 11.5–15.5)
WBC: 6.5 10*3/uL (ref 4.0–10.5)

## 2017-10-25 LAB — TROPONIN I: Troponin I: <0.03 ng/mL (ref <0.03)

## 2017-10-25 NOTE — ED Provider Notes (Signed)
[facility] Provider Note   CSN: [id number] Arrival date & time: 10/25/17  7425     History   Chief Complaint Chief Complaint  Patient presents with  . Palpitations    HPI [name] Dyer is a 83 y.o. female.  HPI Patient presents with difficulty sleeping last night.  Reportedly was up most of the night.  States she could just not fall asleep.  States she took some warm milk in a bath and did not help.  States she has been under a lot of stress due to caring for a family member.  States he has been getting worse.  No other recent change in medications.  Has previously been on melatonin but did not take it last night.  States she had been off it for about a month because she had been sleeping well.  Has felt her heart "flip flop".  Has a history of atrial fibrillation that appears to be permanent.  She is on Eliquis and takes atenolol.  She is also on Lasix.  States her weight has gone a little lower.  States she ate some heavy soup last night and worries if this could have caused it.  She does not drink caffeine. Past Medical History:  Diagnosis Date  . Anemia   . Arrhythmia   . Cataract   . Colon polyps   . Community acquired pneumonia Feb 2013   LLL  . Congestive heart failure ([place])   . Hyperlipidemia   . Hypertension   . Memory difficulty 10/01/2016  . Osteoporosis   . Pleural effusion, right Feb 2013  . SVT (supraventricular tachycardia) ([place]) feb 2013  . UTI (lower urinary tract infection)     Patient Active Problem List   Diagnosis Date Noted  . Memory difficulty 10/01/2016  . Colitis presumed infectious 06/06/2016  . Chronic diastolic CHF (congestive heart failure) ([place]) 06/06/2016  . Paroxysmal atrial fibrillation ([place]) 06/06/2016  . Hypertension 06/06/2016  . Hyperlipidemia 06/06/2016  . Diarrhea 06/04/2016  . Generalized abdominal pain 06/04/2016  . Anemia, unspecified 04/27/2012    Past Surgical History:    Procedure Laterality Date  . ABDOMINAL HYSTERECTOMY  1983  . APPENDECTOMY  1983  . COLONOSCOPY    . COLONOSCOPY N/A 06/10/2016   Procedure: COLONOSCOPY;  Surgeon: Manus Gunning, MD;  Location: [facility];  Service: Gastroenterology;  Laterality: N/A;  . ESOPHAGOGASTRODUODENOSCOPY N/A 06/10/2016   Procedure: ESOPHAGOGASTRODUODENOSCOPY (EGD);  Surgeon: Manus Gunning, MD;  Location: [facility];  Service: Gastroenterology;  Laterality: N/A;  . HEMIARTHROPLASTY HIP  2011   Left femoral neck fracture  . UPPER GASTROINTESTINAL ENDOSCOPY      OB History    No data available       Home Medications    Prior to Admission medications   Medication Sig Start Date End Date Taking? Authorizing Provider  alendronate (FOSAMAX) 70 MG tablet Take 70 mg by mouth once a week.  09/30/16   [provider]  ALPRAZolam Duanne Moron) 0.5 MG tablet 1 tab 1 hour before procedure and second tab if needed at MRI scanner 04/08/17   Dennie Bible, NP  apixaban (ELIQUIS) 2.5 MG TABS tablet Take 1 tablet (2.5 mg total) by mouth 2 (two) times daily. 03/05/17   [name], MD  atenolol (TENORMIN) 25 MG tablet Take 1 tablet (25 mg total) by mouth 2 (two) times daily. Noon and bedtime 12/12/16   [name], MD  B Complex Vitamins (VITAMIN B  COMPLEX PO) Take 1 tablet by mouth daily.    [provider]  calcium-vitamin D (OSCAL WITH D) 500-200 MG-UNIT tablet Take 1 tablet by mouth 2 (two) times daily.    [provider]  donepezil (ARICEPT) 10 MG tablet Take 1 tablet (10 mg total) by mouth at bedtime. 05/18/17   Dennie Bible, NP  furosemide (LASIX) 20 MG tablet Take 1 tablet (20 mg total) by mouth daily. . 11/02/15   [name], MD  gabapentin (NEURONTIN) 300 MG capsule Take 1 capsule (300 mg total) by mouth at bedtime. 05/18/17   [name], MD  Melatonin 5 MG CAPS Take 1 capsule by mouth at bedtime.     [provider]  potassium chloride  (K-DUR,KLOR-CON) 10 MEQ tablet TAKE 1 TABLET DAILY 01/29/17   [name], MD  simvastatin (ZOCOR) 20 MG tablet Take 10 mg by mouth daily.     [provider]    Family History Family History  Problem Relation Age of Onset  . Colon polyps Son   . Stroke Father     Social History Social History  Substance Use Topics  . Smoking status: Never Smoker  . Smokeless tobacco: Never Used  . Alcohol use No     Allergies   Sulfa antibiotics   Review of Systems Review of Systems  Constitutional: Negative for appetite change and fever.  HENT: Negative for congestion.   Respiratory: Negative for chest tightness and stridor.   Cardiovascular: Positive for palpitations. Negative for leg swelling.  Gastrointestinal: Negative for abdominal pain.  Genitourinary: Negative for flank pain.  Musculoskeletal: Negative for back pain.  Neurological: Negative for speech difficulty.  Psychiatric/Behavioral: The patient is nervous/anxious.        Difficulty sleeping     Physical Exam Updated Vital Signs BP (!) 154/93 (BP Location: Right Arm)   Pulse 74   Temp (!) 97.5 F (36.4 C) (Oral)   Resp 16   SpO2 97%   Physical Exam  Constitutional: She appears well-developed.  HENT:  Head: Normocephalic.  Eyes: EOM are normal.  Cardiovascular: Normal rate.   Irregular rhythm  Pulmonary/Chest: She has no wheezes.  Abdominal: Soft. There is no tenderness.  Musculoskeletal: She exhibits no edema.  Neurological: She is alert.  Skin: Skin is warm. Capillary refill takes less than 2 seconds.  Psychiatric: She has a normal mood and affect.     ED Treatments / Results  Labs (all labs ordered are listed, but only abnormal results are displayed) Labs Reviewed  BASIC METABOLIC PANEL - Abnormal; Notable for the following:       Result Value   Calcium 8.8 (*)    All other components within normal limits  CBC  TROPONIN I  I-STAT TROPONIN, ED    EKG  EKG  Interpretation  Date/Time:  Sunday October 25 2017 08:26:14 EDT Ventricular Rate:  93 PR Interval:    QRS Duration: 86 QT Interval:  376 QTC Calculation: 467 R Axis:   52 Text Interpretation:  Atrial fibrillation Low voltage QRS Abnormal ECG Confirmed by Davonna Belling [id number]) on 10/25/2017 8:54:36 AM       Radiology Dg Chest 2 View  Result Date: 10/25/2017 CLINICAL DATA:  Left leg swelling for several days, restlessness. History of CHF, arrhythmia, pleural effusion. EXAM: CHEST  2 VIEW COMPARISON:  Chest x-ray dated 06/02/2016. FINDINGS: Heart size is within normal limits, stable. Overall cardiomediastinal silhouette is stable. Atherosclerotic change noted at the aortic arch. Lungs  are at least mildly hyperexpanded suggesting COPD. Coarse lung markings noted bilaterally, suggesting chronic interstitial lung disease and/or bronchiectasis. No confluent opacity to suggest a developing pneumonia. No pleural effusion or pneumothorax seen. Mild levoscoliosis of the lower thoracic spine. Minimal degenerative spurring. No acute or suspicious osseous finding. IMPRESSION: 1. No active cardiopulmonary disease. No evidence of pneumonia or pulmonary edema. 2. Lungs are hyperexpanded suggesting COPD. Probable chronic interstitial lung disease and/or bronchiectasis. 3. Aortic atherosclerosis. Electronically Signed   By: Franki Cabot M.D.   On: 10/25/2017 08:56    Procedures Procedures (including critical care time)  Medications Ordered in ED Medications - No data to display   Initial Impression / Assessment and Plan / ED Course  I have reviewed the triage vital signs and the nursing notes.  Pertinent labs & imaging results that were available during my care of the patient were reviewed by me and considered in my medical decision making (see chart for details).     Patient with difficulty sleeping.  Likely an anxiety or stress component.  EKG shows chronic A. fib.  Labs reassuring.  Has a  sick husband at home and has been requiring more care.  Has not been taking her melatonin.  Will discharge home.  I discussed with the patient and her older son. Final Clinical Impressions(s) / ED Diagnoses   Final diagnoses:  Heart palpitations  Difficulty sleeping    New Prescriptions New Prescriptions   No medications on file     Davonna Belling, MD 10/25/17 1151

## 2017-10-25 NOTE — Discharge Instructions (Signed)
Take your melatonin to help with the sleeping.  You may need more help at home with your husband.

## 2017-10-25 NOTE — ED Notes (Signed)
Patient given something to drink per edp approval  

## 2017-10-25 NOTE — ED Triage Notes (Signed)
Patient complains of being restless last night and left leg swelling for a few days. States that she feels her heart "flip/flopping". Denies CP, no SOB, alert and oriented

## 2017-10-28 ENCOUNTER — Telehealth: Payer: Self-pay | Admitting: Internal Medicine

## 2017-10-28 ENCOUNTER — Other Ambulatory Visit: Payer: Self-pay | Admitting: Nurse Practitioner

## 2017-10-28 NOTE — Telephone Encounter (Signed)
Patient calling back and states that she gave the wrong # to call back (# below). Patient calling and states that she has had increased swelling in her legs and feet. She denies having any SOB. She states that she has not been able to weigh herself daily or elevate her legs because she has been back and forth taking care of her sick husband. Explained the importance of daily weights and made her aware that they should be done at the same time every day with the same amount of clothes on. Also explained to the patient that she should do her best to elevate her legs that this will help with swelling. When inquiring about the patient's salt intake the patient states that she knows that she can do better with her diet and states that she does not add salt but she does eat foods that are canned and already contain a high sodium content. Advised patient to try and avoid these foods as they do not help with her swelling. Patient advised that she can take double lasix (40 mg total) and double potassium (20 meq total) x 1 day. Patient scheduled to see Dr. Harrington Challenger on 11/5. Advised patient to keep appointment. Patient verbalized understanding and thanked me for the call.

## 2017-10-28 NOTE — Telephone Encounter (Signed)
Called patient at [phone], but there was no answer. Left message for patient to call back.

## 2017-10-28 NOTE — Telephone Encounter (Signed)
Left message for patient to call back.

## 2017-10-28 NOTE — Telephone Encounter (Signed)
Mrs. Sparkman is calling because she is having fluid in her ankles and they are swollen and its not getting any better . Please call at [phone]

## 2017-11-02 ENCOUNTER — Encounter: Payer: Self-pay | Admitting: Internal Medicine

## 2017-11-02 ENCOUNTER — Ambulatory Visit (INDEPENDENT_AMBULATORY_CARE_PROVIDER_SITE_OTHER): Payer: Medicare Other | Admitting: Internal Medicine

## 2017-11-02 VITALS — BP 140/80 | HR 83 | Ht 63.5 in | Wt 127.0 lb

## 2017-11-02 DIAGNOSIS — I482 Chronic atrial fibrillation: Secondary | ICD-10-CM

## 2017-11-02 DIAGNOSIS — I10 Essential (primary) hypertension: Secondary | ICD-10-CM | POA: Diagnosis not present

## 2017-11-02 DIAGNOSIS — I4821 Permanent atrial fibrillation: Secondary | ICD-10-CM

## 2017-11-02 MED ORDER — FUROSEMIDE 20 MG PO TABS: ORAL_TABLET | ORAL | 3 refills | Status: DC

## 2017-11-02 MED ORDER — POTASSIUM CHLORIDE CRYS ER 10 MEQ PO TBCR: EXTENDED_RELEASE_TABLET | ORAL | 3 refills | Status: DC

## 2017-11-02 NOTE — Progress Notes (Signed)
Cardiology Office Note   Date:  11/02/2017   ID:  CANNON QUINTON, DOB [birth year], MRN [id number]  PCP:  Thressa Sheller, MD  Cardiologist:   [name], MD   F?U of atrial fib      History of Present Illness: [name] CHAPDELAINE is a 83 y.o. female with a history of atrial fib, HTN and HL  I saw her in December 2017   Feet swelling for the past 2 to 3 wks  Breathing is stable  She says so/so.   No dizziness.  No palpitations      Outpatient Medications Prior to Visit  Medication Sig Dispense Refill  . alendronate (FOSAMAX) 70 MG tablet Take 70 mg by mouth once a week.     Marland Kitchen amoxicillin (AMOXIL) 500 MG capsule Take 500 mg every 6 (six) hours by mouth.    Marland Kitchen apixaban (ELIQUIS) 2.5 MG TABS tablet Take 1 tablet (2.5 mg total) by mouth 2 (two) times daily. 180 tablet 1  . atenolol (TENORMIN) 25 MG tablet Take 1 tablet (25 mg total) by mouth 2 (two) times daily. Noon and bedtime 60 tablet 11  . B Complex Vitamins (VITAMIN B COMPLEX PO) Take 1 tablet by mouth daily.    . calcium-vitamin D (OSCAL WITH D) 500-200 MG-UNIT tablet Take 1 tablet by mouth 2 (two) times daily.    Marland Kitchen donepezil (ARICEPT) 10 MG tablet TAKE 1 TABLET AT BEDTIME 90 tablet 1  . estradiol (ESTRACE) 1 MG tablet Take 1 mg daily by mouth.    . furosemide (LASIX) 20 MG tablet Take 1 tablet (20 mg total) by mouth daily. . 90 tablet 3  . gabapentin (NEURONTIN) 300 MG capsule Take 1 capsule (300 mg total) by mouth at bedtime. 90 capsule 2  . Melatonin 5 MG CAPS Take 1 capsule by mouth at bedtime.     . potassium chloride (K-DUR,KLOR-CON) 10 MEQ tablet TAKE 1 TABLET DAILY 90 tablet 3  . simvastatin (ZOCOR) 20 MG tablet Take 10 mg by mouth daily.     Marland Kitchen ALPRAZolam (XANAX) 0.5 MG tablet 1 tab 1 hour before procedure and second tab if needed at MRI scanner (Patient not taking: Reported on 11/02/2017) 2 tablet 0   No facility-administered medications prior to visit.      Allergies:   Ciprofloxacin and Sulfa antibiotics   Past  Medical History:  Diagnosis Date  . Anemia   . Arrhythmia   . Cataract   . Colon polyps   . Community acquired pneumonia Feb 2013   LLL  . Congestive heart failure ([place])   . Hyperlipidemia   . Hypertension   . Memory difficulty 10/01/2016  . Osteoporosis   . Pleural effusion, right Feb 2013  . SVT (supraventricular tachycardia) ([place]) feb 2013  . UTI (lower urinary tract infection)     Past Surgical History:  Procedure Laterality Date  . ABDOMINAL HYSTERECTOMY  1983  . APPENDECTOMY  1983  . COLONOSCOPY    . HEMIARTHROPLASTY HIP  2011   Left femoral neck fracture  . UPPER GASTROINTESTINAL ENDOSCOPY       Social History:  The patient  reports that  has never smoked. she has never used smokeless tobacco. She reports that she does not drink alcohol or use drugs.   Family History:  The patient's family history includes Colon polyps in her son; Stroke in her father.    ROS:  Please see the history of present illness. All other systems are reviewed and  Negative to the above problem except as noted.    PHYSICAL EXAM: VS:  BP 140/80   Pulse 83   Ht 5' 3.5" (1.613 m)   Wt 127 lb (57.6 kg)   SpO2 97%   BMI 22.14 kg/m   GEN: Well nourished, well developed, in no acute distress  HEENT: normal  Neck: no JVD, carotid bruits, or masses Cardiac:  Irreg Irreg  ; no murmurs, rubs, or gallops,Trace  edema  Respiratory:  clear to auscultation bilaterally, normal work of breathing GI: soft, nontender, nondistended, + BS  No hepatomegaly  MS: no deformity Moving all extremities   Skin: warm and dry, no rash Neuro:  Strength and sensation are intact Psych: euthymic mood, full affect   EKG:  EKG is ordered today.  [place]rail fib  70     Lipid Panel No results found for: CHOL, TRIG, HDL, CHOLHDL, VLDL, LDLCALC, LDLDIRECT    Wt Readings from Last 3 Encounters:  11/02/17 127 lb (57.6 kg)  04/08/17 132 lb 3.2 oz (60 kg)  12/12/16 130 lb 12.8 oz (59.3 kg)      ASSESSMENT AND  PLAN:  1  Atrial fib  Permanent   Continue on rate control  Check BMET    2    HTN   BP is OK  Continue current meds    3  LE edema  Overall volume appears up mildly  Says she doesnot notice much with lasix. I would CHeck BMET  Take lasix 40 alt with 20  Take 20 KCL alt with 10 mg   F/U in March.    Pt with multiple questions re sleep and fosamax and aricept today  Also interested in probiiotic   Had diarrhea on one   Primary MD [place] and   [place]    F/U in 9 months    Signed, [name], MD  11/02/2017 4:28 PM    [address], [place], [place]  [address] Phone: [phone]; Fax: [phone]

## 2017-11-02 NOTE — Patient Instructions (Signed)
Your physician has recommended you make the following change in your medication:  1.) lasix--40 mg daily alternating with 20 mg daily 2.) potassium chloride--20 meq (2 tablets) daily alternating with 10 meq daily  We will call you next week to check on how your doing.  Your physician wants you to follow-up in: 6 months with Dr. Harrington Challenger.  You will receive a reminder letter in the mail two months in advance. If you don't receive a letter, please call our office to schedule the follow-up appointment.

## 2017-11-11 ENCOUNTER — Telehealth: Payer: Self-pay | Admitting: Internal Medicine

## 2017-11-11 DIAGNOSIS — Z803 Family history of malignant neoplasm of breast: Secondary | ICD-10-CM | POA: Diagnosis not present

## 2017-11-11 DIAGNOSIS — Z1231 Encounter for screening mammogram for malignant neoplasm of breast: Secondary | ICD-10-CM | POA: Diagnosis not present

## 2017-11-11 NOTE — Telephone Encounter (Signed)
New message     Patient came in this morning asking for a call back from [place]. She said it is about her swelling. She said she was supposed to get a call on Monday but didn't receive one.

## 2017-11-11 NOTE — Telephone Encounter (Signed)
Called patient back.  Her weight is stable(within 1 pound) and the swelling is better taking lasix 40 daily alternating with 20 mg daily.   She wanted to know how long she would need to continue this dose.  Advised this is her new dose, as long as it continues to be effective.  She will continue to monitor weight and swelling and call with further concerns.  She has been advised to minimize salt in her diet but finds it difficult due to her husband being in a facility and needing to care for him.

## 2017-11-13 ENCOUNTER — Telehealth: Payer: Self-pay | Admitting: Internal Medicine

## 2017-11-13 NOTE — Telephone Encounter (Signed)
Advised patient that her lasix increase is not causing vaginal bleeding.  She started the hormone replacement 2-3 months ago.  I advised she call the doctor that prescribed that medication.    Then pt describes the episode of bleeding as discoloration on the paper when she wiped that was a red/orange color and it hurt when she finished her urine stream.  She denies any low back pain or fever.  Advised to contact PCP for possibly having a UTI and needing urine collection.  Pt is in agreement with this.  Advised if she doesn't hear back from PCP that she could utilize urgent care clinic as well.

## 2017-11-13 NOTE — Telephone Encounter (Signed)
[name] Dyer is calling because she was asked to double her lasix and she started bleeding from her vagina and wants to know if the lasix could have caused it . Also she is taking  Estradiol  52m  ( take one tablet once a day ) and is not sure if that could cause the bleeding and  she is taking Eliquis. Please call on the home number , lost her cell phone on yesterday .

## 2017-11-23 ENCOUNTER — Other Ambulatory Visit: Payer: Self-pay | Admitting: Internal Medicine

## 2017-11-27 NOTE — Progress Notes (Signed)
[facility]  PATIENT: [name] Dyer DOB: 01/18/1932   REASON FOR VISIT: Follow-up for memory loss, neuropathy HISTORY FROM: Patient    HISTORY OF PRESENT ILLNESS: UPDATE 12/3/2018CM [name] Dyer, 83 year old female returns for follow-up with history of memory loss and for peripheral neuropathy in her feet.  Due to the increased confusion her gabapentin was decreased to 300 mg at night only and she has tolerated that dose well.  04/08/17 Abnormal MRI brain (without) demonstrating: 1. Moderate periventricular and subcortical chronic small vessel ischemic disease. 2. 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 3. Mild diffuse and moderate perisylvian atrophy. 4. No acute findings. Patient feels her memory loss is better with Aricept.  She goes to the nursing home every day to see her husband.  She denies any difficulty with driving.  She denies any difficulty with ADLs.  She reports mild balance issues but she denies any falls.  She denies any problems controlling her bowel or bladder.  She was treated recently for urinary tract infection with antibiotics.  She has a history of atrial fibrillation and is on Eliquis.  She returns for reevaluation  [name] Dyer, 83 year old female returns for follow-up with history of memory loss. Labs for B12 and RPR returned normal. She did not obtain MRI of the brain as requested. She has a 2-3 year history of problems getting around and driving and difficulty remembering names. She denies any numbness or weakness of the face arms or legs. She reports some mild balance issues but she has not had any falls. She is not having problems controlling her bowel or bladder. She was started on Aricept and is currently tolerating 10 mg daily without side effects and she feels her memory is better. She returns for reevaluation   10/01/16 [name]. [name] Dyer is an 83 year old right-handed white female with a history of  atrial fibrillation and pulmonary hypertension. The patient is on anticoagulant therapy. The patient went into the hospital around 06/06/2016 with diarrhea associated with colitis. She has had some problems with diarrhea 2 or 3 weeks prior to admission. The patient was placed on Cipro during that admission, she was found to have some confusion but responded to IV fluid therapy. The patient has reported a several year history of some problems with getting turned around with driving, and some difficulty with remembering names and word finding problems. She has had episodes of confusion where her house looked inverted where furniture she thought should have been in another area of the house. This was associated with the use of Percocet. She has improved with the confusion coming off the medication. The patient lives with her husband who also has significant problems with memory. The patient is able to keep up with her medications and appointments, she is able to manage the finances on her own. She denies any numbness or weakness of the face, arms, or legs with exception that she had an episode of numbness and shocking sensations in her right hand one week ago that has resolved. She reports some mild balance issues, but no falls. She denies issues controlling the bowels or the bladder. She is able to rest well at night. She is sent to this office for an evaluation.   REVIEW OF SYSTEMS: Full 14 system review of systems performed and notable only for those listed, all others are neg:  Constitutional: Fatigue Cardiovascular: neg Ear/Nose/Throat: neg  Skin: neg Eyes: Blurred vision Respiratory: neg Gastroitestinal: neg  Hematology/Lymphatic: Easy bruising Endocrine: neg Musculoskeletal:  Joint pain Allergy/Immunology: neg Neurological: Memory loss Psychiatric: neg Sleep : neg   ALLERGIES: Allergies  Allergen Reactions  . Ciprofloxacin Diarrhea  . Sulfa Antibiotics Hives and Rash    Severe Rash     HOME MEDICATIONS: Outpatient Medications Prior to Visit  Medication Sig Dispense Refill  . acetaminophen (TYLENOL) 500 MG tablet Take 500 mg by mouth every 6 (six) hours as needed.    Marland Kitchen alendronate (FOSAMAX) 70 MG tablet Take 70 mg by mouth once a week.     Marland Kitchen atenolol (TENORMIN) 25 MG tablet Take 1 tablet (25 mg total) by mouth 2 (two) times daily. Noon and bedtime 60 tablet 11  . B Complex Vitamins (VITAMIN B COMPLEX PO) Take 1 tablet by mouth daily.    . calcium-vitamin D (OSCAL WITH D) 500-200 MG-UNIT tablet Take 1 tablet by mouth 2 (two) times daily.    Marland Kitchen donepezil (ARICEPT) 10 MG tablet TAKE 1 TABLET AT BEDTIME 90 tablet 1  . ELIQUIS 2.5 MG TABS tablet TAKE 1 TABLET TWICE A DAY 180 tablet 1  . estradiol (ESTRACE) 1 MG tablet Take 1 mg daily by mouth.    . furosemide (LASIX) 20 MG tablet Take 40 mg daily alternating with 20 mg daily. 135 tablet 3  . gabapentin (NEURONTIN) 300 MG capsule Take 1 capsule (300 mg total) by mouth at bedtime. 90 capsule 2  . Melatonin 5 MG CAPS Take 1 capsule by mouth at bedtime.     . potassium chloride (K-DUR,KLOR-CON) 10 MEQ tablet Take 20 meq (2 tablets) daily alternating with 10 meq (1 tablet) daily 135 tablet 3  . simvastatin (ZOCOR) 20 MG tablet Take 10 mg by mouth daily.     Marland Kitchen amoxicillin (AMOXIL) 500 MG capsule Take 500 mg every 6 (six) hours by mouth.     No facility-administered medications prior to visit.     PAST MEDICAL HISTORY: Past Medical History:  Diagnosis Date  . Anemia   . Arrhythmia   . Cataract   . Colon polyps   . Community acquired pneumonia Feb 2013   LLL  . Congestive heart failure ([place])   . Hyperlipidemia   . Hypertension   . Memory difficulty 10/01/2016  . Osteoporosis   . Pleural effusion, right Feb 2013  . SVT (supraventricular tachycardia) ([place]) feb 2013  . UTI (lower urinary tract infection)     PAST SURGICAL HISTORY: Past Surgical History:  Procedure Laterality Date  . ABDOMINAL HYSTERECTOMY  1983  .  APPENDECTOMY  1983  . COLONOSCOPY    . COLONOSCOPY N/A 06/10/2016   Procedure: COLONOSCOPY;  Surgeon: Manus Gunning, MD;  Location: [facility];  Service: Gastroenterology;  Laterality: N/A;  . ESOPHAGOGASTRODUODENOSCOPY N/A 06/10/2016   Procedure: ESOPHAGOGASTRODUODENOSCOPY (EGD);  Surgeon: Manus Gunning, MD;  Location: [facility];  Service: Gastroenterology;  Laterality: N/A;  . HEMIARTHROPLASTY HIP  2011   Left femoral neck fracture  . UPPER GASTROINTESTINAL ENDOSCOPY      FAMILY HISTORY: Family History  Problem Relation Age of Onset  . Colon polyps Son   . Stroke Father     SOCIAL HISTORY: Social History   Socioeconomic History  . Marital status: Single    Spouse name: Not on file  . Number of children: 2  . Years of education: 50  . Highest education level: Not on file  Social Needs  . Financial resource strain: Not on file  . Food insecurity - worry: Not on file  . Food insecurity - inability:  Not on file  . Transportation needs - medical: Not on file  . Transportation needs - non-medical: Not on file  Occupational History  . Occupation: Retired  Tobacco Use  . Smoking status: Never Smoker  . Smokeless tobacco: Never Used  Substance and Sexual Activity  . Alcohol use: No  . Drug use: No  . Sexual activity: Not on file  Other Topics Concern  . Not on file  Social History Narrative   Married, retired, 2 sons. Husband is disabled, non-ambulatory. Spends the the winters in [place].   Right-handed   Caffeine: Cheerwine or hot chocolate occasionally but otherwise none     PHYSICAL EXAM  Vitals:   11/30/17 0954  BP: 128/64  Pulse: 84  Weight: 127 lb (57.6 kg)  Height: 5' 3.5" (1.613 m)   Body mass index is 22.14 kg/m.  Generalized: Well developed, in no acute distress  Head: normocephalic and atraumatic,. Oropharynx benign  Neck: Supple, no carotid bruits  Cardiac: Regular rate rhythm, no murmur  Musculoskeletal: No deformity    Neurological examination   Mentation: Alert .AFT 7. Clock drawing 3/4. MMSE - Mini Mental State Exam 11/30/2017 04/08/2017 10/01/2016  Orientation to time _0 Orientation to Place _1 Registration _2 Attention/ Calculation _3 Recall _4 Language- name 2 objects _5 Language- repeat 1 0 1  Language- follow 3 step command _6 Language- read & follow direction _7 Write a sentence _8 Copy design 1 0 1  Total score _9 Follows all commands speech and language fluent.   Cranial nerve II-XII: .Pupils were equal round reactive to light extraocular movements were full, visual field were full on confrontational test. Facial sensation and strength were normal. hearing was intact to finger rubbing bilaterally. Uvula tongue midline. head turning and shoulder shrug were normal and symmetric.Tongue protrusion into cheek strength was normal. Motor: normal bulk and tone, full strength in the BUE, BLE, fine finger movements normal, no pronator drift. No focal weakness Sensory: normal and symmetric to light touch, on the face arms and legs Coordination: finger-nose-finger, heel-to-shin bilaterally, no dysmetria Reflexes: Symmetric upper and lower, plantar responses were flexor bilaterally. Gait and Station: Rising up from seated position without assistance, normal stance,  moderate stride, good arm swing, smooth turning, able to perform tiptoe, and heel walking without difficulty. Tandem gait is mildly unsteady.  No assistive device  DIAGNOSTIC DATA (LABS, IMAGING, TESTING) - I reviewed patient records, labs, notes, testing and imaging myself where available.  Lab Results  Component Value Date   WBC 6.5 10/25/2017   HGB 12.9 10/25/2017   HCT 39.5 10/25/2017   MCV 95.6 10/25/2017   PLT PLATELET CLUMPS NOTED ON SMEAR, UNABLE TO ESTIMATE 10/25/2017      Component Value Date/Time   NA 141 10/25/2017 0907   K 4.5 10/25/2017 0907   CL 108 10/25/2017 0907   CO2 25  10/25/2017 0907   GLUCOSE 96 10/25/2017 0907   BUN 19 10/25/2017 0907   CREATININE 0.82 10/25/2017 0907   CREATININE 0.72 03/16/2015 1717   CALCIUM 8.8 (L) 10/25/2017 0907   PROT 5.3 (L) 06/07/2016 0529   ALBUMIN 2.7 (L) 06/07/2016 0529   AST 24 06/07/2016 0529   ALT 18 06/07/2016 0529   ALKPHOS 53 06/07/2016 0529   BILITOT 0.7 06/07/2016 0529   GFRNONAA >60 10/25/2017 7622  GFRAA >60 10/25/2017 0907    ASSESSMENT AND PLAN  83 y.o. year old female  has a past medical history of Anemia; Arrhythmia;  Hyperlipidemia; Hypertension; Memory difficulty (10/01/2016); here to follow up for memory loss   Continue Aricept for memory at 10 mg daily Labs for treatable causes of memory loss returned normal. Memory score is stable  Continue gabapentin 300 mg at night for neuropathy Follow up 6 months next with Dr. Jannifer Franklin  I spent 25 min in total face to face time with the patient more than 50% of which was spent counseling and coordination of care, reviewing test results reviewing medications and discussing and reviewing the diagnosis of memory loss and  MRI after last visit  to document her disease process , comparison of memory scores , etc. Patient is not getting lost when driving, no safety needs identified at this time Dennie Bible, [facility], [facility], [facility] [address] [place], [place] [address] [phone]

## 2017-11-30 ENCOUNTER — Encounter: Payer: Self-pay | Admitting: Nurse Practitioner

## 2017-11-30 ENCOUNTER — Ambulatory Visit (INDEPENDENT_AMBULATORY_CARE_PROVIDER_SITE_OTHER): Payer: Medicare Other | Admitting: Nurse Practitioner

## 2017-11-30 VITALS — BP 128/64 | HR 84 | Ht 63.5 in | Wt 127.0 lb

## 2017-11-30 DIAGNOSIS — R413 Other amnesia: Secondary | ICD-10-CM

## 2017-11-30 NOTE — Progress Notes (Signed)
I have read the note, and I agree with the clinical assessment and plan.  [name]

## 2017-11-30 NOTE — Patient Instructions (Addendum)
Continue Aricept for memory at 10 mg daily Labs for treatable causes of memory loss returned normal. Memory score is stable  Continue gabapentin 300 mg at night Follow up 6 months next with Dr. Jannifer Franklin

## 2017-12-23 DIAGNOSIS — N8110 Cystocele, unspecified: Secondary | ICD-10-CM | POA: Diagnosis not present

## 2017-12-23 DIAGNOSIS — N951 Menopausal and female climacteric states: Secondary | ICD-10-CM | POA: Diagnosis not present

## 2017-12-25 DIAGNOSIS — E785 Hyperlipidemia, unspecified: Secondary | ICD-10-CM | POA: Diagnosis not present

## 2017-12-25 DIAGNOSIS — I4891 Unspecified atrial fibrillation: Secondary | ICD-10-CM | POA: Diagnosis not present

## 2017-12-25 DIAGNOSIS — I129 Hypertensive chronic kidney disease with stage 1 through stage 4 chronic kidney disease, or unspecified chronic kidney disease: Secondary | ICD-10-CM | POA: Diagnosis not present

## 2017-12-25 DIAGNOSIS — M810 Age-related osteoporosis without current pathological fracture: Secondary | ICD-10-CM | POA: Diagnosis not present

## 2017-12-25 DIAGNOSIS — H269 Unspecified cataract: Secondary | ICD-10-CM | POA: Diagnosis not present

## 2017-12-25 DIAGNOSIS — M179 Osteoarthritis of knee, unspecified: Secondary | ICD-10-CM | POA: Diagnosis not present

## 2017-12-25 DIAGNOSIS — N183 Chronic kidney disease, stage 3 (moderate): Secondary | ICD-10-CM | POA: Diagnosis not present

## 2018-01-26 ENCOUNTER — Other Ambulatory Visit: Payer: Self-pay | Admitting: Internal Medicine

## 2018-02-24 ENCOUNTER — Telehealth: Payer: Self-pay | Admitting: Nurse Practitioner

## 2018-02-24 NOTE — Telephone Encounter (Signed)
Pt said she feels "dopey" taking gabapentin (NEURONTIN) 300 MG capsule. She is taking 1 tab at night. Pt is wanting to decrease to 1/2 tab at night. [facility] She is complaining of pain in the feet and legs, ache more so in the morning. Bottom of feet feels "thin", toes hurt most of the time. She cannot stand for the sheet to touch her toes, she takes a tylenol and rubs them with bengay and this seems to help at night to go to sleep. She is having difficulty wearing shoes and wants advice on what type she should get or where she could get them. Pt said she is almost out of and needs refill for donepezil (ARICEPT) 10 MG tablet sent to [facility] Please call to advise.

## 2018-02-25 MED ORDER — GABAPENTIN 100 MG PO CAPS: 200.0000 mg | ORAL_CAPSULE | Freq: Every day | ORAL | 0 refills | Status: DC

## 2018-02-25 MED ORDER — DONEPEZIL HCL 10 MG PO TABS: 10.0000 mg | ORAL_TABLET | Freq: Every day | ORAL | 1 refills | Status: DC

## 2018-02-25 NOTE — Telephone Encounter (Signed)
I spoke to pt.  She would like to take a lower dose of her gabapentin due to feeling groggy.  Taking 358m po qhs.  She needs refill but would like to get 1056mpo caps and take a lower dose to see if this would help with grogginess.  I relayed that this may not help with the neuropathy pain as well, but she is wanting to do this and can go up as needed with taking the 1001maps.  Would like 30 day supply [facility] [address].  I told her that she needs to pick out her own shoes, (fit well).  [facility].

## 2018-02-25 NOTE — Telephone Encounter (Signed)
Okay to decrease gabapentin

## 2018-02-25 NOTE — Telephone Encounter (Signed)
Placed order for 2-3 caps daily at bedtime of 119m caps of gabapentin.

## 2018-04-01 ENCOUNTER — Other Ambulatory Visit: Payer: Self-pay | Admitting: Nurse Practitioner

## 2018-04-08 DIAGNOSIS — M810 Age-related osteoporosis without current pathological fracture: Secondary | ICD-10-CM | POA: Insufficient documentation

## 2018-04-08 DIAGNOSIS — G4700 Insomnia, unspecified: Secondary | ICD-10-CM | POA: Insufficient documentation

## 2018-04-08 DIAGNOSIS — G309 Alzheimer's disease, unspecified: Secondary | ICD-10-CM | POA: Insufficient documentation

## 2018-04-08 DIAGNOSIS — F0280 Dementia in other diseases classified elsewhere without behavioral disturbance: Secondary | ICD-10-CM | POA: Insufficient documentation

## 2018-05-04 ENCOUNTER — Telehealth: Payer: Self-pay | Admitting: Internal Medicine

## 2018-05-04 NOTE — Telephone Encounter (Signed)
Pt c/o Shortness Of Breath: STAT if SOB developed within the last 24 hours or pt is noticeably SOB on the phone  1. Are you currently SOB (can you hear that pt is SOB on the phone)? Yes, yes 2. How long have you been experiencing SOB? Unknown   3. Are you SOB when sitting or when up moving around? Moving around   4. Are you currently experiencing any other symptoms? Jerking cant breathe deep, no she said she is refusing the ER because she dont want to sit for 14 hours.

## 2018-05-04 NOTE — Telephone Encounter (Signed)
Pt calls today with c/o SOB. She is audibly SOB. States she has "doubled up on her lasix yesterday and today." She states she has a 3lb weight gain. Currently her husband is in the hospital and she is needing to stop and rest when walking to and from his room. She does not know her current HR or BP. Pt seems anxious and irritated which may be exacerbating her symptoms. I have encouraged her to calm down and take slow, deep breaths. She asked to see Dr Harrington Challenger but first available for yearly follow up is Sept. I was able to obtain an appt for 5/8 with Cecilie Kicks, NP for further evaluation. Pt states she may not be able to make it that early in the morning, but I encouraged her to keep her appointment since she is so symptomatic. She agreed.

## 2018-05-05 ENCOUNTER — Ambulatory Visit
Admission: RE | Admit: 2018-05-05 | Discharge: 2018-05-05 | Disposition: A | Discharge status: Home / Self Care | Payer: Medicare Other | Source: Ambulatory Visit | Attending: Cardiology | Admitting: Cardiology

## 2018-05-05 ENCOUNTER — Ambulatory Visit (INDEPENDENT_AMBULATORY_CARE_PROVIDER_SITE_OTHER): Payer: Medicare Other | Admitting: Cardiology

## 2018-05-05 ENCOUNTER — Encounter: Payer: Self-pay | Admitting: Cardiology

## 2018-05-05 VITALS — BP 148/84 | HR 74 | Ht 65.0 in | Wt 128.0 lb

## 2018-05-05 DIAGNOSIS — R0600 Dyspnea, unspecified: Secondary | ICD-10-CM

## 2018-05-05 DIAGNOSIS — Z79899 Other long term (current) drug therapy: Secondary | ICD-10-CM | POA: Diagnosis not present

## 2018-05-05 DIAGNOSIS — R5383 Other fatigue: Secondary | ICD-10-CM | POA: Diagnosis not present

## 2018-05-05 DIAGNOSIS — R0602 Shortness of breath: Secondary | ICD-10-CM

## 2018-05-05 DIAGNOSIS — I482 Chronic atrial fibrillation: Secondary | ICD-10-CM | POA: Diagnosis not present

## 2018-05-05 DIAGNOSIS — I4821 Permanent atrial fibrillation: Secondary | ICD-10-CM

## 2018-05-05 NOTE — Progress Notes (Signed)
Cardiology Office Note   Date:  05/05/2018   ID:  [name] Dyer, DOB May 30, 1932, MRN [id number]  PCP:  [name] Cuff, MD  Cardiologist:  Dr. Harrington Dyer    Chief Complaint  Patient presents with  . Shortness of Breath      History of Present Illness: [name] Dyer is a 84 y.o. female who presents for SOB.   Pt last seen by Dr. Harrington Dyer 11/02/17  She has a history of Permanent atrial fib, HTN and HL and lower ext edema.   Last echo 06/07/16 with normal EF 60-65%  pk PA pressure 50 mmHg-moderate pulmonary HTN.  She had bronchitis a month ago and was treated with antibiotics and steroids.  She is now off these meds.  She has been SOB over last few days and has taken lasix 40 mg every day with some improvement.  Her wt was up to 127 and now back to 125.  Still SOB but not consistently, today walking to BR she was stable. No chest pain.  Denies blood in her stool or urine.  She has been under stress in caring for her husband, he has been hospitalized and is now back at home.    She is dizzy today she believes is the Neurontin.  Neurology has been adjusting. She has been taking small steps with walking when this comes on.  She also feels jittery.     Past Medical History:  Diagnosis Date  . Anemia   . Arrhythmia   . Cataract   . Colon polyps   . Community acquired pneumonia Feb 2013   LLL  . Congestive heart failure ([place])   . Hyperlipidemia   . Hypertension   . Memory difficulty 10/01/2016  . Osteoporosis   . Pleural effusion, right Feb 2013  . SVT (supraventricular tachycardia) ([place]) feb 2013  . UTI (lower urinary tract infection)     Past Surgical History:  Procedure Laterality Date  . ABDOMINAL HYSTERECTOMY  1983  . APPENDECTOMY  1983  . COLONOSCOPY    . COLONOSCOPY N/A 06/10/2016   Procedure: COLONOSCOPY;  Surgeon: [name] Gunning, MD;  Location: [facility];  Service: Gastroenterology;  Laterality: N/A;  . ESOPHAGOGASTRODUODENOSCOPY N/A 06/10/2016   Procedure:  ESOPHAGOGASTRODUODENOSCOPY (EGD);  Surgeon: [name] Gunning, MD;  Location: [facility];  Service: Gastroenterology;  Laterality: N/A;  . HEMIARTHROPLASTY HIP  2011   Left femoral neck fracture  . UPPER GASTROINTESTINAL ENDOSCOPY       Current Outpatient Medications  Medication Sig Dispense Refill  . acetaminophen (TYLENOL) 500 MG tablet Take 500 mg by mouth every 6 (six) hours as needed.    Marland Kitchen alendronate (FOSAMAX) 70 MG tablet Take 70 mg by mouth once a week.     Marland Kitchen atenolol (TENORMIN) 25 MG tablet Take 1 tablet (25 mg total) by mouth 2 (two) times daily. Noon and bedtime 60 tablet 11  . B Complex Vitamins (VITAMIN B COMPLEX PO) Take 1 tablet by mouth daily.    . calcium-vitamin D (OSCAL WITH D) 500-200 MG-UNIT tablet Take 1 tablet by mouth 2 (two) times daily.    Marland Kitchen donepezil (ARICEPT) 10 MG tablet Take 1 tablet (10 mg total) by mouth at bedtime. 90 tablet 1  . ELIQUIS 2.5 MG TABS tablet TAKE 1 TABLET TWICE A DAY 180 tablet 1  . estradiol (ESTRACE) 0.5 MG tablet as directed.    Marland Kitchen estradiol (ESTRACE) 1 MG tablet Take 1 mg daily by mouth.    . furosemide (LASIX)  20 MG tablet Take 40 mg daily alternating with 20 mg daily. 135 tablet 3  . gabapentin (NEURONTIN) 100 MG capsule TAKE 2 TO 3 CAPSULES BY MOUTH AT BEDTIME **DIFFERENT  DOSAGE  ** 90 capsule 1  . Melatonin 5 MG CAPS Take 1 capsule by mouth at bedtime.     . potassium chloride (K-DUR,KLOR-CON) 10 MEQ tablet TAKE 1 TABLET DAILY 90 tablet 2  . simvastatin (ZOCOR) 10 MG tablet Take 1 tablet by mouth daily.     No current facility-administered medications for this visit.     Allergies:   Ciprofloxacin and Sulfa antibiotics    Social History:  The patient  reports that she has never smoked. She has never used smokeless tobacco. She reports that she does not drink alcohol or use drugs.   Family History:  The patient's family history includes Colon polyps in her son; Stroke in her father.    ROS:  General:no colds or fevers,  no weight changes Skin:no rashes or ulcers HEENT:no blurred vision, no congestion CV:see HPI PUL:see HPI GI:no diarrhea constipation or melena, no indigestion GU:no hematuria, no dysuria MS:no joint pain, no claudication Neuro:no syncope, + lightheadedness Endo:no diabetes, no thyroid disease \ Wt Readings from Last 3 Encounters:  05/05/18 128 lb (58.1 kg)  11/30/17 127 lb (57.6 kg)  11/02/17 127 lb (57.6 kg)     PHYSICAL EXAM: VS:  BP (!) 148/84   Pulse 74   Ht _0  (1.651 m)   Wt 128 lb (58.1 kg)   SpO2 99%   BMI 21.30 kg/m  , BMI Body mass index is 21.3 kg/m. General:Pleasant affect, NAD Skin:Warm and dry, brisk capillary refill HEENT:normocephalic, sclera clear, mucus membranes moist, some dizziness with movement Neck:supple, mild JVD, no bruits  Heart:irreg irreg without murmur, gallup, rub or click Lungs:clear without rales, rhonchi, or wheezes WSF:KCLE, non tender, + BS, do not palpate liver spleen or masses Ext:no lower ext edema, 2+ pedal pulses, 2+ radial pulses Neuro:alert and oriented X 3, MAE, follows commands, + facial symmetry    EKG:  EKG is NOT ordered today.    Recent Labs: 10/25/2017: BUN 19; Creatinine, Ser 0.82; Hemoglobin 12.9; Platelets PLATELET CLUMPS NOTED ON SMEAR, UNABLE TO ESTIMATE; Potassium 4.5; Sodium 141    Lipid Panel No results found for: CHOL, TRIG, HDL, CHOLHDL, VLDL, LDLCALC, LDLDIRECT     Other studies Reviewed: Additional studies/ records that were reviewed today include: . Echo 06/07/16  Study Conclusions  - Left ventricle: The cavity size was normal. Systolic function was   normal. The estimated ejection fraction was in the range of 60%   to 65%. Wall motion was normal; there were no regional wall   motion abnormalities. Doppler parameters are consistent with high   ventricular filling pressure. - Aortic valve: Trileaflet; mildly thickened, mildly calcified   leaflets. There was trivial regurgitation. - Mitral  valve: Calcified annulus. Mild focal calcification of the   anterior leaflet (medial segment(s)). There was mild   regurgitation. - Right atrium: The atrium was mildly dilated. - Tricuspid valve: There was mild regurgitation. - Pulmonic valve: There was trivial regurgitation. - Pulmonary arteries: PA peak pressure: 50 mm Hg (S).  Impressions:  - The right ventricular systolic pressure was increased consistent   with moderate pulmonary hypertension.   ASSESSMENT AND PLAN:  1.  Dyspnea has improved with increasing her lasix, she was on sterid after bronchitis that she has now completed.  Will have her take lasix 40 daily for  the rest of this week. And then return to alternating with 20 mg and 40 mg like she normally does.  Will check labs today and 2 V CXR to eval for PNA with recent bronchitis.  Will check echo as well.  No chest pain.  She will follow up in 4-6 weeks with myself or Dr. Harrington Dyer and depending on results of test.    2.   SOB will check BNP  3.    Chronic a fib rate controlled.  On Eliquis for anticoagulation will check CBC, she denies any bleeding.    4.     HLD on statin followed by PCP.   5.     Fatigue with dyspnea will check TSH   Current medicines are reviewed with the patient today.  The patient Has no concerns regarding medicines.  The following changes have been made:  See above Labs/ tests ordered today include:see above  Disposition:   FU:  see above  Signed, Cecilie Kicks, NP  05/05/2018 8:18 AM    [place], [place], [place], [name] Phone: [phone]; Fax: [phone]

## 2018-05-05 NOTE — Patient Instructions (Addendum)
Medication Instructions:   TAKE LASIX 40 MG  WED. THURS.  AND FRI   THEN RESUME TAKING LASIX  20 MG  DAILY ALTERNATING WITH EVERY OTHER DAY  40 MG  DAILY   If you need a refill on your cardiac medications before your next appointment, please call your pharmacy.  Labwork:  CBC BMET TSH AND BNP TODAY    Testing/Procedures:  Your physician has requested that you have an echocardiogram. Echocardiography is a painless test that uses sound waves to create images of your heart. It provides your doctor with information about the size and shape of your heart and how well your heart's chambers and valves are working. This procedure takes approximately one hour. There are no restrictions for this procedure.  A chest x-ray takes a picture of the organs and structures inside the chest, including the heart, lungs, and blood vessels. This test can show several things, including, whether the heart is enlarges; whether fluid is building up in the lungs; and whether pacemaker / defibrillator leads are still in place.    Follow-Up: IN 4 TO 6 WEEKS  DR ROSS OR  INGOLD    Any Other Special Instructions Will Be Listed Below (If Applicable).  MAKE SURE  YOU CALL CLINIC BACK WITH NAME OF SLEEPING PILL TO ADD TO MEDICATION LIST

## 2018-05-06 ENCOUNTER — Telehealth: Payer: Self-pay

## 2018-05-06 LAB — BASIC METABOLIC PANEL
BUN/Creatinine Ratio: 20 (ref 12–28)
BUN: 19 mg/dL (ref 8–27)
CO2: 25 mmol/L (ref 20–29)
Calcium: 9.1 mg/dL (ref 8.7–10.3)
Chloride: 103 mmol/L (ref 96–106)
Creatinine, Ser: 0.93 mg/dL (ref 0.57–1.00)
GFR calc Af Amer: 65 mL/min/{1.73_m2} (ref >59)
GFR calc non Af Amer: 57 mL/min/{1.73_m2} — ABNORMAL LOW (ref >59)
Glucose: 75 mg/dL (ref 65–99)
Potassium: 4.7 mmol/L (ref 3.5–5.2)
Sodium: 144 mmol/L (ref 134–144)

## 2018-05-06 LAB — CBC
Hematocrit: 38.8 % (ref 34.0–46.6)
Hemoglobin: 12.8 g/dL (ref 11.1–15.9)
MCH: 30.6 pg (ref 26.6–33.0)
MCHC: 33 g/dL (ref 31.5–35.7)
MCV: 93 fL (ref 79–97)
RBC: 4.18 x10E6/uL (ref 3.77–5.28)
RDW: 15.1 % (ref 12.3–15.4)
WBC: 9.1 10*3/uL (ref 3.4–10.8)

## 2018-05-06 LAB — TSH: TSH: 4.2 u[IU]/mL (ref 0.450–4.500)

## 2018-05-06 LAB — PRO B NATRIURETIC PEPTIDE: NT-Pro BNP: 1709 pg/mL — ABNORMAL HIGH (ref 0–738)

## 2018-05-06 NOTE — Telephone Encounter (Signed)
-----  Message from Isaiah Serge, NP sent at 05/05/2018  3:55 PM EDT ----- No fluid on CXR, no pneumonia.  Looks good.

## 2018-05-06 NOTE — Telephone Encounter (Signed)
Pt is aware and agreeable to normal results. She wanted to update our medication records that she is prescribed Trazodone 50 mg PRN at night for sleep. She also wanted to update our records that she is prescribed estradiol 0.5 mg and takes a 0.5 tablet every other day. She does not take the 1 mg estradiol tablet at all. I will update med rec.  She is aware and agreeable to echo appt and OV appt with Pecolia Ades 6/12.

## 2018-05-22 ENCOUNTER — Other Ambulatory Visit: Payer: Self-pay | Admitting: Internal Medicine

## 2018-05-25 NOTE — Telephone Encounter (Signed)
Eliquis 2.17m refill request received; pt is 84yrs old, wt-58.1kg, Crea-0.93 on 05/05/18, last seen by LCecilie Kickson 05/05/18; will send in refill to requested pharmacy.

## 2018-06-02 ENCOUNTER — Other Ambulatory Visit: Payer: Self-pay

## 2018-06-02 ENCOUNTER — Ambulatory Visit (INDEPENDENT_AMBULATORY_CARE_PROVIDER_SITE_OTHER): Payer: Medicare Other | Admitting: Neurology

## 2018-06-02 ENCOUNTER — Encounter: Payer: Self-pay | Admitting: Neurology

## 2018-06-02 VITALS — BP 134/67 | HR 76 | Ht 65.0 in | Wt 127.0 lb

## 2018-06-02 DIAGNOSIS — R413 Other amnesia: Secondary | ICD-10-CM

## 2018-06-02 MED ORDER — DONEPEZIL HCL 5 MG PO TABS: 5.0000 mg | ORAL_TABLET | Freq: Every day | ORAL | 3 refills | Status: DC

## 2018-06-02 NOTE — Progress Notes (Signed)
Reason for visit: Memory disturbance, peripheral neuropathy  [name] Dyer is an 84 y.o. female  History of present illness:  [name] Dyer is an 84 year old right-handed white female with a history of a memory disturbance.  The patient is on Aricept taking 10 mg at night which she complains of vivid dreams at night and she has a significant problem with runny nose.  The patient reports for 5 episodes of diarrhea daily in the morning, this goes away by the afternoon.  The patient has a peripheral neuropathy, she takes gabapentin 300 mg at night with some benefit.  She is able to get to sleep with this medication.  She returns for an evaluation.  She has had a cough over the last 2 to 3 weeks, she is not running fevers.  Past Medical History:  Diagnosis Date  . Anemia   . Arrhythmia   . Cataract   . Colon polyps   . Community acquired pneumonia Feb 2013   LLL  . Congestive heart failure ([place])   . Hyperlipidemia   . Hypertension   . Memory difficulty 10/01/2016  . Osteoporosis   . Pleural effusion, right Feb 2013  . SVT (supraventricular tachycardia) ([place]) feb 2013  . UTI (lower urinary tract infection)     Past Surgical History:  Procedure Laterality Date  . ABDOMINAL HYSTERECTOMY  1983  . APPENDECTOMY  1983  . COLONOSCOPY    . COLONOSCOPY N/A 06/10/2016   Procedure: COLONOSCOPY;  Surgeon: Manus Gunning, MD;  Location: [facility];  Service: Gastroenterology;  Laterality: N/A;  . ESOPHAGOGASTRODUODENOSCOPY N/A 06/10/2016   Procedure: ESOPHAGOGASTRODUODENOSCOPY (EGD);  Surgeon: Manus Gunning, MD;  Location: [facility];  Service: Gastroenterology;  Laterality: N/A;  . HEMIARTHROPLASTY HIP  2011   Left femoral neck fracture  . UPPER GASTROINTESTINAL ENDOSCOPY      Family History  Problem Relation Age of Onset  . Colon polyps Son   . Stroke Father     Social history:  reports that she has never smoked. She has never used smokeless tobacco. She  reports that she does not drink alcohol or use drugs.    Allergies  Allergen Reactions  . Ciprofloxacin Diarrhea  . Sulfa Antibiotics Hives and Rash    Severe Rash    Medications:  Prior to Admission medications   Medication Sig Start Date End Date Taking? Authorizing Provider  acetaminophen (TYLENOL) 500 MG tablet Take 500 mg by mouth every 6 (six) hours as needed.    [provider]  alendronate (FOSAMAX) 70 MG tablet Take 70 mg by mouth once a week.  09/30/16   [provider]  atenolol (TENORMIN) 25 MG tablet Take 1 tablet (25 mg total) by mouth 2 (two) times daily. Noon and bedtime 12/12/16   [name], MD  B Complex Vitamins (VITAMIN B COMPLEX PO) Take 1 tablet by mouth daily.    [provider]  calcium-vitamin D (OSCAL WITH D) 500-200 MG-UNIT tablet Take 1 tablet by mouth 2 (two) times daily.    [provider]  donepezil (ARICEPT) 5 MG tablet Take 1 tablet (5 mg total) by mouth at bedtime. 06/02/18   [name], MD  ELIQUIS 2.5 MG TABS tablet TAKE 1 TABLET TWICE A DAY 05/25/18   [name], MD  estradiol (ESTRACE) 0.5 MG tablet Take 0.5 tablets by mouth every other day. 02/26/18   [provider]  furosemide (LASIX) 20 MG tablet Take 40 mg daily alternating with  20 mg daily. 11/02/17   [name], MD  gabapentin (NEURONTIN) 100 MG capsule TAKE 2 TO 3 CAPSULES BY MOUTH AT BEDTIME **DIFFERENT  DOSAGE  ** 04/01/18   Dennie Bible, NP  Melatonin 5 MG CAPS Take 1 capsule by mouth at bedtime.     [provider]  potassium chloride (K-DUR,KLOR-CON) 10 MEQ tablet TAKE 1 TABLET DAILY 01/26/18   [name], MD  simvastatin (ZOCOR) 10 MG tablet Take 1 tablet by mouth daily. 02/15/18   [provider]  traZODone (DESYREL) 50 MG tablet Take 1 tablet (50 mg total) by mouth at bedtime as needed for sleep. 05/06/18   Isaiah Serge, NP    ROS:  Out of a complete 14 system review of symptoms, the patient complains  only of the following symptoms, and all other reviewed systems are negative.  Decreased appetite, fatigue Ear pain, runny nose Eye itching Cough, shortness of breath, chest tightness Leg swelling Diarrhea Insomnia Joint pain, aching muscles, muscle cramps, walking difficulty Itching Memory loss Confusion  Blood pressure 134/67, pulse 76, height _0  (1.651 m), weight 127 lb (57.6 kg).  Physical Exam  General: The patient is alert and cooperative at the time of the examination.  Skin: No significant peripheral edema is noted.   Neurologic Exam  Mental status: The patient is alert and oriented x 3 at the time of the examination. The patient has apparent normal recent and remote memory, with an apparently normal attention span and concentration ability.  Mini-Mental status examination done today shows a total score 30/30.   Cranial nerves: Facial symmetry is present. Speech is normal, no aphasia or dysarthria is noted. Extraocular movements are full. Visual fields are full.  Motor: The patient has good strength in all 4 extremities.  Sensory examination: Soft touch sensation is symmetric on the face, arms, and legs.  Coordination: The patient has good finger-nose-finger and heel-to-shin bilaterally.  Gait and station: The patient has a slightly wide-based gait, slightly unsteady.  Tandem gait is unsteady.  Romberg is negative. No drift is seen.  Reflexes: Deep tendon reflexes are symmetric.   Assessment/Plan:  1.  Memory disturbance  2.  Peripheral neuropathy  The patient will be reduced on the Aricept dose to 5 mg at night.  She is having vivid dreams at night and having diarrhea the next morning.  The patient will continue on the gabapentin at 300 mg at night, she may wish to go higher on the dose in the future.  She will follow-up through this office in about 6 months.  Jill Alexanders MD 06/02/2018 12:47 PM  [facility] [address] [place], [place] [address]  Phone [phone] Fax [phone]

## 2018-06-02 NOTE — Patient Instructions (Signed)
We will reduce the dose of the Aricept (donepezil) to 5 mg daily.

## 2018-06-09 ENCOUNTER — Ambulatory Visit (INDEPENDENT_AMBULATORY_CARE_PROVIDER_SITE_OTHER): Payer: Medicare Other | Admitting: Cardiology

## 2018-06-09 ENCOUNTER — Ambulatory Visit (HOSPITAL_COMMUNITY): Payer: Medicare Other | Attending: Cardiology

## 2018-06-09 ENCOUNTER — Encounter (INDEPENDENT_AMBULATORY_CARE_PROVIDER_SITE_OTHER): Payer: Self-pay

## 2018-06-09 ENCOUNTER — Encounter: Payer: Self-pay | Admitting: Cardiology

## 2018-06-09 ENCOUNTER — Other Ambulatory Visit: Payer: Self-pay

## 2018-06-09 VITALS — BP 150/72 | HR 87 | Ht 65.0 in | Wt 128.0 lb

## 2018-06-09 DIAGNOSIS — I5032 Chronic diastolic (congestive) heart failure: Secondary | ICD-10-CM | POA: Diagnosis not present

## 2018-06-09 DIAGNOSIS — E785 Hyperlipidemia, unspecified: Secondary | ICD-10-CM

## 2018-06-09 DIAGNOSIS — J90 Pleural effusion, not elsewhere classified: Secondary | ICD-10-CM | POA: Insufficient documentation

## 2018-06-09 DIAGNOSIS — I4891 Unspecified atrial fibrillation: Secondary | ICD-10-CM | POA: Insufficient documentation

## 2018-06-09 DIAGNOSIS — I471 Supraventricular tachycardia: Secondary | ICD-10-CM | POA: Diagnosis not present

## 2018-06-09 DIAGNOSIS — I482 Chronic atrial fibrillation: Secondary | ICD-10-CM

## 2018-06-09 DIAGNOSIS — I081 Rheumatic disorders of both mitral and tricuspid valves: Secondary | ICD-10-CM | POA: Insufficient documentation

## 2018-06-09 DIAGNOSIS — I4821 Permanent atrial fibrillation: Secondary | ICD-10-CM

## 2018-06-09 DIAGNOSIS — I10 Essential (primary) hypertension: Secondary | ICD-10-CM

## 2018-06-09 DIAGNOSIS — I119 Hypertensive heart disease without heart failure: Secondary | ICD-10-CM | POA: Insufficient documentation

## 2018-06-09 DIAGNOSIS — R5383 Other fatigue: Secondary | ICD-10-CM | POA: Insufficient documentation

## 2018-06-09 DIAGNOSIS — Z79899 Other long term (current) drug therapy: Secondary | ICD-10-CM

## 2018-06-09 DIAGNOSIS — R0602 Shortness of breath: Secondary | ICD-10-CM | POA: Insufficient documentation

## 2018-06-09 MED ORDER — FUROSEMIDE 20 MG PO TABS: 20.0000 mg | ORAL_TABLET | Freq: Every day | ORAL | 3 refills | Status: DC

## 2018-06-09 MED ORDER — ATENOLOL 25 MG PO TABS: 37.5000 mg | ORAL_TABLET | Freq: Two times a day (BID) | ORAL | 11 refills | Status: DC

## 2018-06-09 NOTE — Patient Instructions (Addendum)
Medication Instructions: Your physician has recommended you make the following change in your medication:  INCREASE: Atenolol to 37.5 mg 2 times daily (Take 1 and a half tablet 2 times per day)   Start taking Lasix 20 mg by mouth daily ( You may take 1 extra tablet if you have increased weight gain, Swelling or shortness of breath)    Labwork: TODAY: BMET  Procedures/Testing: None   Follow-Up:Your physician recommends that you schedule a follow-up appointment in: 4 months with Dr.Ross     Any Additional Special Instructions Will Be Listed Below (If Applicable).   Low-Sodium Eating Plan Sodium, which is an element that makes up salt, helps you maintain a healthy balance of fluids in your body. Too much sodium can increase your blood pressure and cause fluid and waste to be held in your body. Your health care provider or dietitian may recommend following this plan if you have high blood pressure (hypertension), kidney disease, liver disease, or heart failure. Eating less sodium can help lower your blood pressure, reduce swelling, and protect your heart, liver, and kidneys. What are tips for following this plan? General guidelines  Most people on this plan should limit their sodium intake to 1,500-2,000 mg (milligrams) of sodium each day. Reading food labels  The Nutrition Facts label lists the amount of sodium in one serving of the food. If you eat more than one serving, you must multiply the listed amount of sodium by the number of servings.  Choose foods with less than 140 mg of sodium per serving.  Avoid foods with 300 mg of sodium or more per serving. Shopping  Look for lower-sodium products, often labeled as "low-sodium" or "no salt added."  Always check the sodium content even if foods are labeled as "unsalted" or "no salt added".  Buy fresh foods. ? Avoid canned foods and premade or frozen meals. ? Avoid canned, cured, or processed meats  Buy breads that have less  than 80 mg of sodium per slice. Cooking  Eat more home-cooked food and less restaurant, buffet, and fast food.  Avoid adding salt when cooking. Use salt-free seasonings or herbs instead of table salt or sea salt. Check with your health care provider or pharmacist before using salt substitutes.  Cook with plant-based oils, such as canola, sunflower, or olive oil. Meal planning  When eating at a restaurant, ask that your food be prepared with less salt or no salt, if possible.  Avoid foods that contain MSG (monosodium glutamate). MSG is sometimes added to [place] food, bouillon, and some canned foods. What foods are recommended? The items listed may not be a complete list. Talk with your dietitian about what dietary choices are best for you. Grains Low-sodium cereals, including oats, puffed wheat and rice, and shredded wheat. Low-sodium crackers. Unsalted rice. Unsalted pasta. Low-sodium bread. Whole-grain breads and whole-grain pasta. Vegetables Fresh or frozen vegetables. "No salt added" canned vegetables. "No salt added" tomato sauce and paste. Low-sodium or reduced-sodium tomato and vegetable juice. Fruits Fresh, frozen, or canned fruit. Fruit juice. Meats and other protein foods Fresh or frozen (no salt added) meat, poultry, seafood, and fish. Low-sodium canned tuna and salmon. Unsalted nuts. Dried peas, beans, and lentils without added salt. Unsalted canned beans. Eggs. Unsalted nut butters. Dairy Milk. Soy milk. Cheese that is naturally low in sodium, such as ricotta cheese, fresh mozzarella, or Swiss cheese Low-sodium or reduced-sodium cheese. Cream cheese. Yogurt. Fats and oils Unsalted butter. Unsalted margarine with no trans fat. Vegetable oils  such as canola or olive oils. Seasonings and other foods Fresh and dried herbs and spices. Salt-free seasonings. Low-sodium mustard and ketchup. Sodium-free salad dressing. Sodium-free light mayonnaise. Fresh or refrigerated horseradish.  Lemon juice. Vinegar. Homemade, reduced-sodium, or low-sodium soups. Unsalted popcorn and pretzels. Low-salt or salt-free chips. What foods are not recommended? The items listed may not be a complete list. Talk with your dietitian about what dietary choices are best for you. Grains Instant hot cereals. Bread stuffing, pancake, and biscuit mixes. Croutons. Seasoned rice or pasta mixes. Noodle soup cups. Boxed or frozen macaroni and cheese. Regular salted crackers. Self-rising flour. Vegetables Sauerkraut, pickled vegetables, and relishes. Olives. [place] fries. Onion rings. Regular canned vegetables (not low-sodium or reduced-sodium). Regular canned tomato sauce and paste (not low-sodium or reduced-sodium). Regular tomato and vegetable juice (not low-sodium or reduced-sodium). Frozen vegetables in sauces. Meats and other protein foods Meat or fish that is salted, canned, smoked, spiced, or pickled. Bacon, ham, sausage, hotdogs, corned beef, chipped beef, packaged lunch meats, salt pork, jerky, pickled herring, anchovies, regular canned tuna, sardines, salted nuts. Dairy Processed cheese and cheese spreads. Cheese curds. Blue cheese. Feta cheese. String cheese. Regular cottage cheese. Buttermilk. Canned milk. Fats and oils Salted butter. Regular margarine. Ghee. Bacon fat. Seasonings and other foods Onion salt, garlic salt, seasoned salt, table salt, and sea salt. Canned and packaged gravies. Worcestershire sauce. Tartar sauce. Barbecue sauce. Teriyaki sauce. Soy sauce, including reduced-sodium. Steak sauce. Fish sauce. Oyster sauce. Cocktail sauce. Horseradish that you find on the shelf. Regular ketchup and mustard. Meat flavorings and tenderizers. Bouillon cubes. Hot sauce and Tabasco sauce. Premade or packaged marinades. Premade or packaged taco seasonings. Relishes. Regular salad dressings. Salsa. Potato and tortilla chips. Corn chips and puffs. Salted popcorn and pretzels. Canned or dried soups.  Pizza. Frozen entrees and pot pies. Summary  Eating less sodium can help lower your blood pressure, reduce swelling, and protect your heart, liver, and kidneys.  Most people on this plan should limit their sodium intake to 1,500-2,000 mg (milligrams) of sodium each day.  Canned, boxed, and frozen foods are high in sodium. Restaurant foods, fast foods, and pizza are also very high in sodium. You also get sodium by adding salt to food.  Try to cook at home, eat more fresh fruits and vegetables, and eat less fast food, canned, processed, or prepared foods. This information is not intended to replace advice given to you by your health care provider. Make sure you discuss any questions you have with your health care provider. Document Released: 06/06/2002 Document Revised: 12/08/2016 Document Reviewed: 12/08/2016   If you need a refill on your cardiac medications before your next appointment, please call your pharmacy.

## 2018-06-09 NOTE — Progress Notes (Signed)
Cardiology Office Note:    Date:  06/09/2018   ID:  [name] Dyer, DOB Jun 29, 1932, MRN [id number]  PCP:  [name], MD  Cardiologist:  [name], MD  Referring MD: [name], MD   Chief Complaint  Patient presents with  . Follow-up    shortness of breath    History of Present Illness:    [name] Dyer is a 84 y.o. female with a past medical history significant for Permanent atrial fib on Eliquis, HTN and HL and lower ext edema.  Last echo 06/07/16 with normal EF 60-65%  pk PA pressure 50 mmHg-moderate pulmonary HTN.  [name] Dyer was seen by Cecilie Kicks, NP on 05/05/2018 with complaints of shortness of breath.  She had had bronchitis about a month prior and was treated with antibiotics and steroids.  The patient was noted to have taken Lasix 40 mg every day with some improvement in her symptoms.  Her weight have been up to 127 pounds and came down to 125 pounds but she was continuing to be intermittently short of breath.  She was also having some dizziness that she attributed to her Neurontin and neurology was adjusting her medication.  She was instructed to increase her Lasix to 40 mg for the rest of that week and then alternating 40 mg with 20 mg daily as per her usual.  A chest x-ray was done on 05/05/2018 that showed no acute abnormalities.  A proBNP was checked and was 1709 which is at the upper end of normal for her age.  She was scheduled for an echocardiogram which will be done today, as she did not want to make 2 trips to our office.  [name] Dyer is here today alone. She is feeling much better than at her last appointment. She ended up taking the lasix 40 mg every day for a few more days than advised due to continued swelling and shortness of breath. She is now having no shortness of breath with walking for up to a half a block. She is still not walking fast or pushing herself. She is taking care of her husband and has no time for exercise. She has occ brief  palpitations when she is stressed. She has occ lightheadedness in the mornings. She has been stressed with her husband who is chronically ill having been hospitalized 3 times in the last year.   She has trouble limiting her sodium intake as she eats all prepared foods and does not make any of her own. She eats a lot of canned foods and has cheese/peanut butter nabs with her today.    Past Medical History:  Diagnosis Date  . Anemia   . Arrhythmia   . Cataract   . Colon polyps   . Community acquired pneumonia Feb 2013   LLL  . Congestive heart failure ([place])   . Hyperlipidemia   . Hypertension   . Memory difficulty 10/01/2016  . Osteoporosis   . Pleural effusion, right Feb 2013  . SVT (supraventricular tachycardia) ([place]) feb 2013  . UTI (lower urinary tract infection)     Past Surgical History:  Procedure Laterality Date  . ABDOMINAL HYSTERECTOMY  1983  . APPENDECTOMY  1983  . COLONOSCOPY    . COLONOSCOPY N/A 06/10/2016   Procedure: COLONOSCOPY;  Surgeon: Manus Gunning, MD;  Location: [facility];  Service: Gastroenterology;  Laterality: N/A;  . ESOPHAGOGASTRODUODENOSCOPY N/A 06/10/2016   Procedure: ESOPHAGOGASTRODUODENOSCOPY (EGD);  Surgeon: Manus Gunning, MD;  Location: [facility];  Service: Gastroenterology;  Laterality: N/A;  . HEMIARTHROPLASTY HIP  2011   Left femoral neck fracture  . UPPER GASTROINTESTINAL ENDOSCOPY      Current Medications: Current Meds  Medication Sig  . acetaminophen (TYLENOL) 500 MG tablet Take 500 mg by mouth every 6 (six) hours as needed.  Marland Kitchen alendronate (FOSAMAX) 70 MG tablet Take 70 mg by mouth once a week.   Marland Kitchen atenolol (TENORMIN) 25 MG tablet Take 1 tablet (25 mg total) by mouth 2 (two) times daily. Noon and bedtime  . B Complex Vitamins (VITAMIN B COMPLEX PO) Take 1 tablet by mouth daily.  . calcium-vitamin D (OSCAL WITH D) 500-200 MG-UNIT tablet Take 1 tablet by mouth 2 (two) times daily.  Marland Kitchen donepezil (ARICEPT) 5 MG tablet  Take 1 tablet (5 mg total) by mouth at bedtime.  Marland Kitchen ELIQUIS 2.5 MG TABS tablet TAKE 1 TABLET TWICE A DAY  . estradiol (ESTRACE) 0.5 MG tablet Take 0.5 tablets by mouth every other day.  . furosemide (LASIX) 20 MG tablet Take 40 mg daily alternating with 20 mg daily.  Marland Kitchen gabapentin (NEURONTIN) 100 MG capsule TAKE 2 TO 3 CAPSULES BY MOUTH AT BEDTIME **DIFFERENT  DOSAGE  **  . Melatonin 5 MG CAPS Take 1 capsule by mouth at bedtime.   . potassium chloride (K-DUR,KLOR-CON) 10 MEQ tablet TAKE 1 TABLET DAILY  . simvastatin (ZOCOR) 10 MG tablet Take 1 tablet by mouth daily.  . traZODone (DESYREL) 50 MG tablet Take 1 tablet (50 mg total) by mouth at bedtime as needed for sleep.     Allergies:   Ciprofloxacin and Sulfa antibiotics   Social History   Socioeconomic History  . Marital status: Single    Spouse name: Not on file  . Number of children: 2  . Years of education: 55  . Highest education level: Not on file  Occupational History  . Occupation: Retired  Scientific laboratory technician  . Financial resource strain: Not on file  . Food insecurity:    Worry: Not on file    Inability: Not on file  . Transportation needs:    Medical: Not on file    Non-medical: Not on file  Tobacco Use  . Smoking status: Never Smoker  . Smokeless tobacco: Never Used  Substance and Sexual Activity  . Alcohol use: No  . Drug use: No  . Sexual activity: Not on file  Lifestyle  . Physical activity:    Days per week: Not on file    Minutes per session: Not on file  . Stress: Not on file  Relationships  . Social connections:    Talks on phone: Not on file    Gets together: Not on file    Attends religious service: Not on file    Active member of club or organization: Not on file    Attends meetings of clubs or organizations: Not on file    Relationship status: Not on file  Other Topics Concern  . Not on file  Social History Narrative   Married, retired, 2 sons. Husband is disabled, non-ambulatory. Spends the the  winters in [place].   Right-handed   Caffeine: Cheerwine or hot chocolate occasionally but otherwise none     Family History: The patient's family history includes Colon polyps in her son; Stroke in her father. ROS:   Please see the history of present illness.     All other systems reviewed and are negative.  EKGs/Labs/Other Studies Reviewed:    The following studies were  reviewed today:  Echocardiogram 06/07/16 Study Conclusions - Left ventricle: The cavity size was normal. Systolic function was   normal. The estimated ejection fraction was in the range of 60%   to 65%. Wall motion was normal; there were no regional wall   motion abnormalities. Doppler parameters are consistent with high   ventricular filling pressure. - Aortic valve: Trileaflet; mildly thickened, mildly calcified   leaflets. There was trivial regurgitation. - Mitral valve: Calcified annulus. Mild focal calcification of the   anterior leaflet (medial segment(s)). There was mild   regurgitation. - Right atrium: The atrium was mildly dilated. - Tricuspid valve: There was mild regurgitation. - Pulmonic valve: There was trivial regurgitation. - Pulmonary arteries: PA peak pressure: 50 mm Hg (S).  Impressions: - The right ventricular systolic pressure was increased consistent   with moderate pulmonary hypertension.  EKG:  EKG is not ordered today.    Recent Labs: 05/05/2018: BUN 19; Creatinine, Ser 0.93; Hemoglobin 12.8; NT-Pro BNP 1,709; Platelets CANCELED; Potassium 4.7; Sodium 144; TSH 4.200   Recent Lipid Panel No results found for: CHOL, TRIG, HDL, CHOLHDL, VLDL, LDLCALC, LDLDIRECT  Physical Exam:    VS:  BP (!) 150/72   Pulse 87   Ht _0  (1.651 m)   Wt 128 lb (58.1 kg)   SpO2 98%   BMI 21.30 kg/m     Wt Readings from Last 3 Encounters:  06/09/18 128 lb (58.1 kg)  06/02/18 127 lb (57.6 kg)  05/05/18 128 lb (58.1 kg)     Physical Exam  Constitutional: She is oriented to person, place, and  time. She appears well-developed and well-nourished. No distress.  HENT:  Head: Normocephalic and atraumatic.  Neck: Normal range of motion. Neck supple. No JVD present.  Cardiovascular: Normal rate, normal heart sounds and intact distal pulses. An irregularly irregular rhythm present. Exam reveals no gallop and no friction rub.  No murmur heard. Pulmonary/Chest: Effort normal and breath sounds normal. No respiratory distress. She has no wheezes. She has no rales.  Abdominal: Soft. Bowel sounds are normal.  Musculoskeletal: Normal range of motion. She exhibits no edema.  Neurological: She is alert and oriented to person, place, and time.  Skin: Skin is warm and dry.  Psychiatric: She has a normal mood and affect. Her behavior is normal. Thought content normal.     ASSESSMENT:    1. Chronic diastolic CHF (congestive heart failure) ([place])   2. Permanent atrial fibrillation ([place])   3. Hyperlipidemia, unspecified hyperlipidemia type   4. Essential hypertension    PLAN:    In order of problems listed above:  Chronic diastolic HF: Dyspnea is improved after gentle diuresis. She is under stress with caring for husband who was hospitalized, likely contributed to her symptoms. She is reluctant to continue the lasix 40 mg alt with 20 mg as she thinks that she doesn't need it now and doesn't like to take medications. She will take 20 mg daily and increase to 40 mg if her wt increases or edema or shortness of breath increases.   Permanent atrial fibrillation: Rate controlled on atenolol. Has occasional palpitations, not bothersome.  Patient is on Eliquis for stroke risk reduction. No unusual bleeding.  Hyperlipidemia: On statin, followed by PCP  Hypertension: BP has been mildly elevated for the last several visits with SBP >130. Will increase atneolol to 37.5 mg bid from 25 mg bid which may also help her occ palpitations. She admits to eating too much salt as she is not  cooking due to caring for  her husband and his recent hospitalizations. We discussed ways to decrease sodium intake, reading labels, etc.   Medication Adjustments/Labs and Tests Ordered: Current medicines are reviewed at length with the patient today.  Concerns regarding medicines are outlined above. Labs and tests ordered and medication changes are outlined in the patient instructions below:  Patient Instructions  Medication Instructions: Your physician has recommended you make the following change in your medication:   Start taking Lasix 20 mg by mouth daily ( You may take 1 extra tablet if you have increased weight gain, Swelling or shortness of breath)    Labwork: TODAY: BMET  Procedures/Testing: None   Follow-Up:Your physician recommends that you schedule a follow-up appointment in: 4 months with Dr.Ross     Any Additional Special Instructions Will Be Listed Below (If Applicable).   Low-Sodium Eating Plan Sodium, which is an element that makes up salt, helps you maintain a healthy balance of fluids in your body. Too much sodium can increase your blood pressure and cause fluid and waste to be held in your body. Your health care provider or dietitian may recommend following this plan if you have high blood pressure (hypertension), kidney disease, liver disease, or heart failure. Eating less sodium can help lower your blood pressure, reduce swelling, and protect your heart, liver, and kidneys. What are tips for following this plan? General guidelines  Most people on this plan should limit their sodium intake to 1,500-2,000 mg (milligrams) of sodium each day. Reading food labels  The Nutrition Facts label lists the amount of sodium in one serving of the food. If you eat more than one serving, you must multiply the listed amount of sodium by the number of servings.  Choose foods with less than 140 mg of sodium per serving.  Avoid foods with 300 mg of sodium or more per serving. Shopping  Look for  lower-sodium products, often labeled as "low-sodium" or "no salt added."  Always check the sodium content even if foods are labeled as "unsalted" or "no salt added".  Buy fresh foods. ? Avoid canned foods and premade or frozen meals. ? Avoid canned, cured, or processed meats  Buy breads that have less than 80 mg of sodium per slice. Cooking  Eat more home-cooked food and less restaurant, buffet, and fast food.  Avoid adding salt when cooking. Use salt-free seasonings or herbs instead of table salt or sea salt. Check with your health care provider or pharmacist before using salt substitutes.  Cook with plant-based oils, such as canola, sunflower, or olive oil. Meal planning  When eating at a restaurant, ask that your food be prepared with less salt or no salt, if possible.  Avoid foods that contain MSG (monosodium glutamate). MSG is sometimes added to [place] food, bouillon, and some canned foods. What foods are recommended? The items listed may not be a complete list. Talk with your dietitian about what dietary choices are best for you. Grains Low-sodium cereals, including oats, puffed wheat and rice, and shredded wheat. Low-sodium crackers. Unsalted rice. Unsalted pasta. Low-sodium bread. Whole-grain breads and whole-grain pasta. Vegetables Fresh or frozen vegetables. "No salt added" canned vegetables. "No salt added" tomato sauce and paste. Low-sodium or reduced-sodium tomato and vegetable juice. Fruits Fresh, frozen, or canned fruit. Fruit juice. Meats and other protein foods Fresh or frozen (no salt added) meat, poultry, seafood, and fish. Low-sodium canned tuna and salmon. Unsalted nuts. Dried peas, beans, and lentils without added salt. Unsalted canned  beans. Eggs. Unsalted nut butters. Dairy Milk. Soy milk. Cheese that is naturally low in sodium, such as ricotta cheese, fresh mozzarella, or Swiss cheese Low-sodium or reduced-sodium cheese. Cream cheese. Yogurt. Fats and  oils Unsalted butter. Unsalted margarine with no trans fat. Vegetable oils such as canola or olive oils. Seasonings and other foods Fresh and dried herbs and spices. Salt-free seasonings. Low-sodium mustard and ketchup. Sodium-free salad dressing. Sodium-free light mayonnaise. Fresh or refrigerated horseradish. Lemon juice. Vinegar. Homemade, reduced-sodium, or low-sodium soups. Unsalted popcorn and pretzels. Low-salt or salt-free chips. What foods are not recommended? The items listed may not be a complete list. Talk with your dietitian about what dietary choices are best for you. Grains Instant hot cereals. Bread stuffing, pancake, and biscuit mixes. Croutons. Seasoned rice or pasta mixes. Noodle soup cups. Boxed or frozen macaroni and cheese. Regular salted crackers. Self-rising flour. Vegetables Sauerkraut, pickled vegetables, and relishes. Olives. [place] fries. Onion rings. Regular canned vegetables (not low-sodium or reduced-sodium). Regular canned tomato sauce and paste (not low-sodium or reduced-sodium). Regular tomato and vegetable juice (not low-sodium or reduced-sodium). Frozen vegetables in sauces. Meats and other protein foods Meat or fish that is salted, canned, smoked, spiced, or pickled. Bacon, ham, sausage, hotdogs, corned beef, chipped beef, packaged lunch meats, salt pork, jerky, pickled herring, anchovies, regular canned tuna, sardines, salted nuts. Dairy Processed cheese and cheese spreads. Cheese curds. Blue cheese. Feta cheese. String cheese. Regular cottage cheese. Buttermilk. Canned milk. Fats and oils Salted butter. Regular margarine. Ghee. Bacon fat. Seasonings and other foods Onion salt, garlic salt, seasoned salt, table salt, and sea salt. Canned and packaged gravies. Worcestershire sauce. Tartar sauce. Barbecue sauce. Teriyaki sauce. Soy sauce, including reduced-sodium. Steak sauce. Fish sauce. Oyster sauce. Cocktail sauce. Horseradish that you find on the shelf.  Regular ketchup and mustard. Meat flavorings and tenderizers. Bouillon cubes. Hot sauce and Tabasco sauce. Premade or packaged marinades. Premade or packaged taco seasonings. Relishes. Regular salad dressings. Salsa. Potato and tortilla chips. Corn chips and puffs. Salted popcorn and pretzels. Canned or dried soups. Pizza. Frozen entrees and pot pies. Summary  Eating less sodium can help lower your blood pressure, reduce swelling, and protect your heart, liver, and kidneys.  Most people on this plan should limit their sodium intake to 1,500-2,000 mg (milligrams) of sodium each day.  Canned, boxed, and frozen foods are high in sodium. Restaurant foods, fast foods, and pizza are also very high in sodium. You also get sodium by adding salt to food.  Try to cook at home, eat more fresh fruits and vegetables, and eat less fast food, canned, processed, or prepared foods. This information is not intended to replace advice given to you by your health care provider. Make sure you discuss any questions you have with your health care provider. Document Released: 06/06/2002 Document Revised: 12/08/2016 Document Reviewed: 12/08/2016   If you need a refill on your cardiac medications before your next appointment, please call your pharmacy.       Signed, Daune Perch, NP  06/09/2018 12:14 PM    [facility]

## 2018-06-14 ENCOUNTER — Telehealth: Payer: Self-pay | Admitting: Cardiology

## 2018-06-14 DIAGNOSIS — I5032 Chronic diastolic (congestive) heart failure: Secondary | ICD-10-CM

## 2018-06-14 MED ORDER — FUROSEMIDE 20 MG PO TABS: 40.0000 mg | ORAL_TABLET | Freq: Every day | ORAL | 3 refills | Status: DC

## 2018-06-14 NOTE — Telephone Encounter (Signed)
-----  Message from Isaiah Serge, NP sent at 06/13/2018 10:13 PM EDT ----- Her echo has extra fluid, reviewed with Dr. Harrington Challenger and would like lasix 40 mg daily and check BMP Thursday. Needs follow up with Dr. Harrington Challenger or myself in 2-3 weeks.

## 2018-06-14 NOTE — Telephone Encounter (Signed)
New Message    Patient is calling for echo results and medication changes

## 2018-06-14 NOTE — Telephone Encounter (Signed)
Called and made patient aware of echo results and recommendations to increase lasix to 40 mg QD and check BMET on Thursday. Lab appointment made for 6/20. Also made patient aware she will need f/u appointment in 2-3 weeks. Appointment made for patient to see Cecilie Kicks, NP on 7/2 at 11:30 AM. Patient verbalized understanding. Patient also had a question regarding her atenolol dose. Made patient aware that when she saw Pecolia Ades, NP on 6/12 that she was instructed to increase her atenolol to 37.5 mg BID. Patient states that she has 25 mg tablets and 50 mg tablets. Instructed the patient to use the 25 mg tablets and take 1 1/2 tablets twice a day. Patient verbalized understanding and thanked me for the call.

## 2018-06-17 ENCOUNTER — Telehealth: Payer: Self-pay | Admitting: Cardiology

## 2018-06-17 ENCOUNTER — Other Ambulatory Visit: Payer: Medicare Other

## 2018-06-17 NOTE — Telephone Encounter (Signed)
Advised pt to continue take Lasix daily until her follow up next week. Patient verbalized understanding and agreeable to plan.

## 2018-06-17 NOTE — Telephone Encounter (Signed)
New Message:    Pt wants to know how long she need to continue taking her Lasix the way she was instructed on her last ov ? Her last ov it was increased.

## 2018-06-21 ENCOUNTER — Telehealth: Payer: Self-pay | Admitting: Internal Medicine

## 2018-06-21 ENCOUNTER — Other Ambulatory Visit: Payer: Medicare Other | Admitting: *Deleted

## 2018-06-21 DIAGNOSIS — I5032 Chronic diastolic (congestive) heart failure: Secondary | ICD-10-CM

## 2018-06-21 NOTE — Telephone Encounter (Signed)
New Message    Patient wants to know if she needs to keep taking the double lasik

## 2018-06-21 NOTE — Telephone Encounter (Signed)
Spoke with pt. I advised her to continue her Lasix at 29m qd, as ordered, until her lab work comes back and/or until she is evaluated during her [place]. She verbalized understanding and had no additional questions.

## 2018-06-22 LAB — BASIC METABOLIC PANEL
BUN/Creatinine Ratio: 29 — ABNORMAL HIGH (ref 12–28)
BUN: 25 mg/dL (ref 8–27)
CO2: 22 mmol/L (ref 20–29)
Calcium: 9 mg/dL (ref 8.7–10.3)
Chloride: 109 mmol/L — ABNORMAL HIGH (ref 96–106)
Creatinine, Ser: 0.87 mg/dL (ref 0.57–1.00)
GFR calc Af Amer: 71 mL/min/{1.73_m2} (ref >59)
GFR calc non Af Amer: 61 mL/min/{1.73_m2} (ref >59)
Glucose: 88 mg/dL (ref 65–99)
Potassium: 4.2 mmol/L (ref 3.5–5.2)
Sodium: 136 mmol/L (ref 134–144)

## 2018-06-24 ENCOUNTER — Ambulatory Visit (INDEPENDENT_AMBULATORY_CARE_PROVIDER_SITE_OTHER): Payer: Medicare Other | Admitting: Cardiology

## 2018-06-24 ENCOUNTER — Encounter: Payer: Self-pay | Admitting: Cardiology

## 2018-06-24 DIAGNOSIS — I5032 Chronic diastolic (congestive) heart failure: Secondary | ICD-10-CM

## 2018-06-24 DIAGNOSIS — Z7901 Long term (current) use of anticoagulants: Secondary | ICD-10-CM | POA: Diagnosis not present

## 2018-06-24 DIAGNOSIS — I10 Essential (primary) hypertension: Secondary | ICD-10-CM

## 2018-06-24 DIAGNOSIS — G629 Polyneuropathy, unspecified: Secondary | ICD-10-CM | POA: Insufficient documentation

## 2018-06-24 DIAGNOSIS — I482 Chronic atrial fibrillation: Secondary | ICD-10-CM | POA: Diagnosis not present

## 2018-06-24 DIAGNOSIS — I4821 Permanent atrial fibrillation: Secondary | ICD-10-CM

## 2018-06-24 MED ORDER — ATENOLOL 25 MG PO TABS: ORAL_TABLET | ORAL | 3 refills | Status: DC

## 2018-06-24 MED ORDER — POTASSIUM CHLORIDE CRYS ER 10 MEQ PO TBCR: 10.0000 meq | EXTENDED_RELEASE_TABLET | Freq: Every day | ORAL | 2 refills | Status: DC

## 2018-06-24 MED ORDER — FUROSEMIDE 20 MG PO TABS: 20.0000 mg | ORAL_TABLET | Freq: Every day | ORAL | 3 refills | Status: DC

## 2018-06-24 MED ORDER — ATENOLOL 50 MG PO TABS: ORAL_TABLET | ORAL | 3 refills | Status: DC

## 2018-06-24 NOTE — Progress Notes (Signed)
06/24/2018 Len Blalock Puerta   11/12/1932  [id number]  Primary Physician [name], MD Primary Cardiologist: Dr Harrington Challenger  HPI:  Pleasant 84 y/o female with a history of diastolic CHF. She cares for her husband at home. She has an aid during the day then her son comes in the evening. The pt herself has a memory disorder and is followed by Dr Jannifer Franklin. Recently she had LE edema. Her pro BNP was elevated- 1709. Her weight then was 128 lbs.  Her Lasix was increased. She was unable to keep her follow up with Mickel Baas at [facility] and was placed on my schedule today.  Echo showed normal LVF with diastolic dysfunction.  BMP showed BUN 25. SCr 0.87.  Symptomatically she appears improved. She denies any orthopnea. Her weight is down 4 lbs, she has no edema on exam.    Current Outpatient Medications  Medication Sig Dispense Refill  . acetaminophen (TYLENOL) 500 MG tablet Take 500 mg by mouth every 6 (six) hours as needed.    Marland Kitchen alendronate (FOSAMAX) 70 MG tablet Take 70 mg by mouth once a week.     Marland Kitchen atenolol (TENORMIN) 25 MG tablet Take 1 tablet in the evening 90 tablet 3  . B Complex Vitamins (VITAMIN B COMPLEX PO) Take 1 tablet by mouth daily.    . calcium-vitamin D (OSCAL WITH D) 500-200 MG-UNIT tablet Take 1 tablet by mouth 2 (two) times daily.    Marland Kitchen donepezil (ARICEPT) 5 MG tablet Take 1 tablet (5 mg total) by mouth at bedtime. 90 tablet 3  . ELIQUIS 2.5 MG TABS tablet TAKE 1 TABLET TWICE A DAY 180 tablet 3  . estradiol (ESTRACE) 0.5 MG tablet Take 0.5 tablets by mouth every other day.    . furosemide (LASIX) 20 MG tablet Take 1 tablet (20 mg total) by mouth daily. 60 tablet 3  . gabapentin (NEURONTIN) 100 MG capsule TAKE 2 TO 3 CAPSULES BY MOUTH AT BEDTIME **DIFFERENT  DOSAGE  ** 90 capsule 1  . Melatonin 5 MG CAPS Take 1 capsule by mouth at bedtime.     . potassium chloride (K-DUR,KLOR-CON) 10 MEQ tablet TAKE 1 TABLET DAILY 90 tablet 2  . simvastatin (ZOCOR) 10 MG tablet Take 1 tablet by mouth  daily.    . traZODone (DESYREL) 50 MG tablet Take 1 tablet (50 mg total) by mouth at bedtime as needed for sleep.    Marland Kitchen atenolol (TENORMIN) 50 MG tablet Take 1 tablet every morning 90 tablet 3   No current facility-administered medications for this visit.     Allergies  Allergen Reactions  . Ciprofloxacin Diarrhea  . Sulfa Antibiotics Hives and Rash    Severe Rash    Past Medical History:  Diagnosis Date  . Anemia   . Arrhythmia   . Cataract   . Colon polyps   . Community acquired pneumonia Feb 2013   LLL  . Congestive heart failure ([place])   . Hyperlipidemia   . Hypertension   . Memory difficulty 10/01/2016  . Osteoporosis   . Pleural effusion, right Feb 2013  . SVT (supraventricular tachycardia) ([place]) feb 2013  . UTI (lower urinary tract infection)     Social History   Socioeconomic History  . Marital status: Single    Spouse name: Not on file  . Number of children: 2  . Years of education: 40  . Highest education level: Not on file  Occupational History  . Occupation: Retired  Scientific laboratory technician  .  Financial resource strain: Not on file  . Food insecurity:    Worry: Not on file    Inability: Not on file  . Transportation needs:    Medical: Not on file    Non-medical: Not on file  Tobacco Use  . Smoking status: Never Smoker  . Smokeless tobacco: Never Used  Substance and Sexual Activity  . Alcohol use: No  . Drug use: No  . Sexual activity: Not on file  Lifestyle  . Physical activity:    Days per week: Not on file    Minutes per session: Not on file  . Stress: Not on file  Relationships  . Social connections:    Talks on phone: Not on file    Gets together: Not on file    Attends religious service: Not on file    Active member of club or organization: Not on file    Attends meetings of clubs or organizations: Not on file    Relationship status: Not on file  . Intimate partner violence:    Fear of current or ex partner: Not on file    Emotionally abused:  Not on file    Physically abused: Not on file    Forced sexual activity: Not on file  Other Topics Concern  . Not on file  Social History Narrative   Married, retired, 2 sons. Husband is disabled, non-ambulatory. Spends the the winters in [place].   Right-handed   Caffeine: Cheerwine or hot chocolate occasionally but otherwise none     Family History  Problem Relation Age of Onset  . Colon polyps Son   . Stroke Father      Review of Systems: General: negative for chills, fever, night sweats or weight changes.  Cardiovascular: negative for chest pain, dyspnea on exertion, edema, orthopnea, palpitations, paroxysmal nocturnal dyspnea or shortness of breath Dermatological: negative for rash Respiratory: negative for cough or wheezing Urologic: negative for hematuria Abdominal: negative for nausea, vomiting, diarrhea, bright red blood per rectum, melena, or hematemesis Neurologic: negative for visual changes, syncope, or dizziness All other systems reviewed and are otherwise negative except as noted above.    Blood pressure (!) 148/72, pulse 79, height 5' 4.5" (1.638 m), weight 124 lb 6.4 oz (56.4 kg), SpO2 99 %.  General appearance: alert, cooperative, appears stated age and no distress Neck: no carotid bruit and no JVD Lungs: clear to auscultation bilaterally Heart: irregularly irregular rhythm Extremities: no edema Skin: Skin color, texture, turgor normal. No rashes or lesions Neurologic: Grossly normal   ASSESSMENT AND PLAN:   Chronic diastolic CHF (congestive heart failure) ([place]) Recently had her Lasix adjusted for edema. Echo shows normal LVF with diastolic dysfunction. Her weight is down 4 lbs, edema resolved  Hypertension Controlled  Permanent atrial fibrillation (HCC) Rate controlled. She doesn't like splitting Atenolol (37.5 mg BID)  Chronic anticoagulation CHADS VASC=5   PLAN  I decreased her Lasix to 20 mg daily.  She weighs herself daily and I suggested  she take 40 mg of Lasix daily if her weight gets back up to 128 lbs, then cut back to 20 mg daily when she is back to 124 lbs. I also changed her Atenolol to 50 mg in the morning and 25 mg in the afternoon- she is having trouble splitting her pills. F/U Dr Harrington Challenger in Sept.    Kerin Ransom PA-C 06/24/2018 11:02 AM

## 2018-06-24 NOTE — Assessment & Plan Note (Addendum)
Controlled

## 2018-06-24 NOTE — Assessment & Plan Note (Signed)
Rate controlled. She doesn't like splitting Atenolol (37.5 mg BID)

## 2018-06-24 NOTE — Assessment & Plan Note (Signed)
CHADS VASC=5

## 2018-06-24 NOTE — Patient Instructions (Addendum)
Medication Instructions:   CHANGE ATENOLOL TO 50 MG IN THE MORNING AND 25 MG IN THE EVENING  CHANGE FUROSEMIDE TO 20 MG ONCE DAILY=IF WEIGHT GETS TO 128 LBS INCREASE FUROSEMIDE TO 40 MG ONCE DAILY UNTIL WEIGHT GETS DOWN TO 124 LB  Follow-Up:  Your physician recommends that you schedule a follow-up appointment in: September [place]   If you need a refill on your cardiac medications before your next appointment, please call your pharmacy.

## 2018-06-24 NOTE — Assessment & Plan Note (Signed)
Recently had her Lasix adjusted for edema. Echo shows normal LVF with diastolic dysfunction. Her weight is down 4 lbs, edema resolved

## 2018-06-29 ENCOUNTER — Ambulatory Visit: Payer: Medicare Other | Admitting: Cardiology

## 2018-07-29 ENCOUNTER — Ambulatory Visit: Payer: Medicare Other | Admitting: Cardiology

## 2018-09-20 ENCOUNTER — Encounter: Payer: Self-pay | Admitting: Internal Medicine

## 2018-09-20 ENCOUNTER — Encounter: Payer: Self-pay | Admitting: *Deleted

## 2018-09-20 ENCOUNTER — Encounter (INDEPENDENT_AMBULATORY_CARE_PROVIDER_SITE_OTHER): Payer: Self-pay

## 2018-09-20 ENCOUNTER — Ambulatory Visit (INDEPENDENT_AMBULATORY_CARE_PROVIDER_SITE_OTHER): Payer: Medicare Other | Admitting: Internal Medicine

## 2018-09-20 VITALS — BP 132/64 | HR 89 | Ht 64.5 in | Wt 126.4 lb

## 2018-09-20 DIAGNOSIS — R0602 Shortness of breath: Secondary | ICD-10-CM | POA: Diagnosis not present

## 2018-09-20 DIAGNOSIS — I5032 Chronic diastolic (congestive) heart failure: Secondary | ICD-10-CM

## 2018-09-20 NOTE — Patient Instructions (Signed)
Your physician recommends that you continue on your current medications as directed. Please refer to the Current Medication list given to you today.  Your physician has requested that you have a lexiscan myoview. For further information please visit [email or web address]. Please follow instruction sheet, as given.  Your physician has recommended that you wear a holter monitor. Holter monitors are medical devices that record the heart's electrical activity. Doctors most often use these monitors to diagnose arrhythmias. Arrhythmias are problems with the speed or rhythm of the heartbeat. The monitor is a small, portable device. You can wear one while you do your normal daily activities. This is usually used to diagnose what is causing palpitations/syncope (passing out).  Your physician recommends that you return for lab work in: today (bmet, cbc, tsh)  Your physician wants you to follow-up in: February, 2020 with Dr. Harrington Challenger.  You will receive a reminder letter in the mail two months in advance. If you don't receive a letter, please call our office to schedule the follow-up appointment.

## 2018-09-20 NOTE — Progress Notes (Signed)
09/20/2018 [name] Dyer   22-Mar-1932  [id number]  Primary Cardiologist: Dr Harrington Challenger  HPI:  Pleasant 84 y/o female with a history of diastolic CHF, HTN, permanent atrial fib , dementia She cares for her husband at home.  I saw her in [place] back in [place]   She has been seen by PAs since lasat seen by L Kilroy in Jne 2019    I sched cardiac CT in spring   Cancelled due to afib The pt says she feels rotten in AM    Before or after meds    Energy down    Has SOB with activity    She deneis CP   No palpitations      Current Outpatient Medications  Medication Sig Dispense Refill  . acetaminophen (TYLENOL) 500 MG tablet Take 500 mg by mouth every 6 (six) hours as needed.    Marland Kitchen alendronate (FOSAMAX) 70 MG tablet Take 70 mg by mouth once a week.     Marland Kitchen atenolol (TENORMIN) 25 MG tablet Take 1 tablet in the evening 90 tablet 3  . atenolol (TENORMIN) 50 MG tablet Take 1 tablet every morning 90 tablet 3  . B Complex Vitamins (VITAMIN B COMPLEX PO) Take 1 tablet by mouth daily.    . calcium-vitamin D (OSCAL WITH D) 500-200 MG-UNIT tablet Take 1 tablet by mouth 2 (two) times daily.    Marland Kitchen donepezil (ARICEPT) 5 MG tablet Take 1 tablet (5 mg total) by mouth at bedtime. 90 tablet 3  . ELIQUIS 2.5 MG TABS tablet TAKE 1 TABLET TWICE A DAY 180 tablet 3  . estradiol (ESTRACE) 0.5 MG tablet Take 0.5 tablets by mouth every other day.    . furosemide (LASIX) 20 MG tablet Take 1 tablet (20 mg total) by mouth daily. 60 tablet 3  . gabapentin (NEURONTIN) 100 MG capsule Take 400 mg by mouth daily.    . Melatonin 10 MG CAPS Take by mouth.    . potassium chloride (K-DUR,KLOR-CON) 10 MEQ tablet Take 1 tablet (10 mEq total) by mouth daily. 90 tablet 2  . simvastatin (ZOCOR) 10 MG tablet Take 1 tablet by mouth daily.     No current facility-administered medications for this visit.     Allergies  Allergen Reactions  . Ciprofloxacin Diarrhea  . Sulfa Antibiotics Hives and Rash    Severe Rash    Past  Medical History:  Diagnosis Date  . Anemia   . Arrhythmia   . Cataract   . Colon polyps   . Community acquired pneumonia Feb 2013   LLL  . Congestive heart failure ([place])   . Hyperlipidemia   . Hypertension   . Memory difficulty 10/01/2016  . Osteoporosis   . Pleural effusion, right Feb 2013  . SVT (supraventricular tachycardia) ([place]) feb 2013  . UTI (lower urinary tract infection)     Social History   Socioeconomic History  . Marital status: Single    Spouse name: Not on file  . Number of children: 2  . Years of education: 38  . Highest education level: Not on file  Occupational History  . Occupation: Retired  Scientific laboratory technician  . Financial resource strain: Not on file  . Food insecurity:    Worry: Not on file    Inability: Not on file  . Transportation needs:    Medical: Not on file    Non-medical: Not on file  Tobacco Use  . Smoking status: Never Smoker  . Smokeless tobacco:  Never Used  Substance and Sexual Activity  . Alcohol use: No  . Drug use: No  . Sexual activity: Not on file  Lifestyle  . Physical activity:    Days per week: Not on file    Minutes per session: Not on file  . Stress: Not on file  Relationships  . Social connections:    Talks on phone: Not on file    Gets together: Not on file    Attends religious service: Not on file    Active member of club or organization: Not on file    Attends meetings of clubs or organizations: Not on file    Relationship status: Not on file  . Intimate partner violence:    Fear of current or ex partner: Not on file    Emotionally abused: Not on file    Physically abused: Not on file    Forced sexual activity: Not on file  Other Topics Concern  . Not on file  Social History Narrative   Married, retired, 2 sons. Husband is disabled, non-ambulatory. Spends the the winters in [place].   Right-handed   Caffeine: Cheerwine or hot chocolate occasionally but otherwise none     Family History  Problem Relation Age  of Onset  . Colon polyps Son   . Stroke Father      Review of Systems: All other systems reviewed and are otherwise negative except as noted above.    Blood pressure 132/64, pulse 89, height 5' 4.5" (1.638 m), weight 126 lb 6.4 oz (57.3 kg), SpO2 97 %.  Gen:   Pt is in NAD Neck:   JVP is normal   NO bruits Cardiac Irreg rate, rhythm   No siginf murmurs   No S3 ABd is benign.  No hepatomegaty Ext    Triv edema bilaterally  Musculoskel:   Moving all extremities    ASSESSMENT AND PLAN:   1  Dyspnea   / fatigue     Concerning   May rep her diastolic heart failure but volume is not bad With afib not a good candidate for CT scan Would set up for Lexiscan myovue to r/o large areas of ischemia  2   Chronic diastolic CHF   Volume is not bad on exam today   Check BMET  3  Permanent atrial fibrillation   Set up for 24 hour holter    F/U tentative for Feb unless test results dictate earlier     [name] PA-C 09/20/2018 10:55 AM

## 2018-09-21 LAB — BASIC METABOLIC PANEL
BUN/Creatinine Ratio: 19 (ref 12–28)
BUN: 20 mg/dL (ref 8–27)
CO2: 25 mmol/L (ref 20–29)
Calcium: 9.6 mg/dL (ref 8.7–10.3)
Chloride: 102 mmol/L (ref 96–106)
Creatinine, Ser: 1.04 mg/dL — ABNORMAL HIGH (ref 0.57–1.00)
GFR calc Af Amer: 57 mL/min/{1.73_m2} — ABNORMAL LOW (ref >59)
GFR calc non Af Amer: 49 mL/min/{1.73_m2} — ABNORMAL LOW (ref >59)
Glucose: 99 mg/dL (ref 65–99)
Potassium: 4.2 mmol/L (ref 3.5–5.2)
Sodium: 143 mmol/L (ref 134–144)

## 2018-09-21 LAB — CBC
Hematocrit: 40.3 % (ref 34.0–46.6)
Hemoglobin: 13.2 g/dL (ref 11.1–15.9)
MCH: 30.8 pg (ref 26.6–33.0)
MCHC: 32.8 g/dL (ref 31.5–35.7)
MCV: 94 fL (ref 79–97)
RBC: 4.28 x10E6/uL (ref 3.77–5.28)
RDW: 14.9 % (ref 12.3–15.4)
WBC: 6.8 10*3/uL (ref 3.4–10.8)

## 2018-09-21 LAB — TSH: TSH: 2.62 u[IU]/mL (ref 0.450–4.500)

## 2018-09-27 ENCOUNTER — Telehealth (HOSPITAL_COMMUNITY): Payer: Self-pay

## 2018-09-27 NOTE — Telephone Encounter (Signed)
Attempted to reach the pt to give her instructions for her Nuclear Stress Test, the line was busy. Will attempt again later. S.Americus Scheurich EMTP

## 2018-09-29 ENCOUNTER — Other Ambulatory Visit: Payer: Self-pay | Admitting: Internal Medicine

## 2018-09-29 ENCOUNTER — Ambulatory Visit (INDEPENDENT_AMBULATORY_CARE_PROVIDER_SITE_OTHER): Payer: Medicare Other

## 2018-09-29 ENCOUNTER — Encounter (HOSPITAL_COMMUNITY): Payer: Medicare Other

## 2018-09-29 DIAGNOSIS — I5032 Chronic diastolic (congestive) heart failure: Secondary | ICD-10-CM

## 2018-09-29 DIAGNOSIS — R0602 Shortness of breath: Secondary | ICD-10-CM

## 2018-09-29 DIAGNOSIS — I4821 Permanent atrial fibrillation: Secondary | ICD-10-CM | POA: Diagnosis not present

## 2018-09-30 ENCOUNTER — Ambulatory Visit (HOSPITAL_COMMUNITY): Payer: Medicare Other | Attending: Cardiovascular Disease

## 2018-09-30 DIAGNOSIS — I5032 Chronic diastolic (congestive) heart failure: Secondary | ICD-10-CM

## 2018-09-30 DIAGNOSIS — R0602 Shortness of breath: Secondary | ICD-10-CM | POA: Diagnosis not present

## 2018-09-30 MED ORDER — REGADENOSON 0.4 MG/5ML IV SOLN
0.4000 mg | Freq: Once | INTRAVENOUS | Status: AC
  Administered 2018-09-30: 0.4 mg via INTRAVENOUS

## 2018-09-30 MED ORDER — TECHNETIUM TC 99M TETROFOSMIN IV KIT
31.8000 | PACK | Freq: Once | INTRAVENOUS | Status: AC | PRN
  Administered 2018-09-30: 31.8 via INTRAVENOUS
  Filled 2018-09-30: qty 32

## 2018-09-30 MED ORDER — TECHNETIUM TC 99M TETROFOSMIN IV KIT
10.1000 | PACK | Freq: Once | INTRAVENOUS | Status: AC | PRN
  Administered 2018-09-30: 10.1 via INTRAVENOUS
  Filled 2018-09-30: qty 11

## 2018-10-01 LAB — MYOCARDIAL PERFUSION IMAGING
LV dias vol: 52 mL (ref 46–106)
LV sys vol: 18 mL
Peak HR: 121 {beats}/min
Rest HR: 84 {beats}/min
SDS: 1
SRS: 0
SSS: 1
TID: 0.99

## 2018-11-22 ENCOUNTER — Telehealth: Payer: Self-pay

## 2018-11-22 NOTE — Telephone Encounter (Signed)
**Note De-Identified  Obfuscation** We received a PA request form from [facility] [employer] to fill out for the pts Eliquis. I have completed the form and placed it in Dr Alan Ripper mail bin awaiting her signature.

## 2018-11-23 NOTE — Telephone Encounter (Signed)
**Note De-Identified  Obfuscation** Dr Harrington Challenger has signed the form and I have faxed it to [facility].

## 2018-11-30 NOTE — Telephone Encounter (Signed)
Letter received from [facility] stating that they have approved the pts Eliquis PA. Approval good from 10/24/2018 until 11/23/2019.  I have faxed this approval letter to the pts pharmacy.

## 2018-12-15 ENCOUNTER — Ambulatory Visit: Payer: Medicare Other | Admitting: Nurse Practitioner

## 2019-03-22 ENCOUNTER — Telehealth: Payer: Self-pay | Admitting: *Deleted

## 2019-03-22 NOTE — Telephone Encounter (Signed)
On 3/18 pt cancelled her yearly visit scheduled for 3/27. Will route to COVID r/s pool

## 2019-03-25 ENCOUNTER — Ambulatory Visit: Payer: Medicare Other | Admitting: Internal Medicine

## 2019-04-18 ENCOUNTER — Encounter: Payer: Self-pay | Admitting: Cardiology

## 2019-04-18 ENCOUNTER — Telehealth (INDEPENDENT_AMBULATORY_CARE_PROVIDER_SITE_OTHER): Payer: Medicare Other | Admitting: Cardiology

## 2019-04-18 ENCOUNTER — Other Ambulatory Visit: Payer: Self-pay

## 2019-04-18 VITALS — BP 133/82 | HR 82 | Ht 64.5 in | Wt 126.0 lb

## 2019-04-18 DIAGNOSIS — I4821 Permanent atrial fibrillation: Secondary | ICD-10-CM

## 2019-04-18 DIAGNOSIS — R0609 Other forms of dyspnea: Secondary | ICD-10-CM | POA: Diagnosis not present

## 2019-04-18 DIAGNOSIS — I5032 Chronic diastolic (congestive) heart failure: Secondary | ICD-10-CM

## 2019-04-18 DIAGNOSIS — G629 Polyneuropathy, unspecified: Secondary | ICD-10-CM

## 2019-04-18 DIAGNOSIS — Z7901 Long term (current) use of anticoagulants: Secondary | ICD-10-CM

## 2019-04-18 DIAGNOSIS — R0600 Dyspnea, unspecified: Secondary | ICD-10-CM

## 2019-04-18 NOTE — Patient Instructions (Signed)
Medication Instructions:  Your physician recommends that you continue on your current medications as directed. Please refer to the Current Medication list given to you today.  If you need a refill on your cardiac medications before your next appointment, please call your pharmacy.   Lab work: None ordered  If you have labs (blood work) drawn today and your tests are completely normal, you will receive your results only by: Marland Kitchen MyChart Message (if you have MyChart) OR . A paper copy in the mail If you have any lab test that is abnormal or we need to change your treatment, we will call you to review the results.  Testing/Procedures: None ordered   Follow-Up: At [name], you and your health needs are our priority.  As part of our continuing mission to provide you with exceptional heart care, we have created designated Provider Care Teams.  These Care Teams include your primary Cardiologist (physician) and Advanced Practice Providers (APPs -  Physician Assistants and Nurse Practitioners) who all work together to provide you with the care you need, when you need it. You will need a follow up appointment in:  4 months.  Please call our office 2 months in advance to schedule this appointment.  You may see [name], MD or one of the following Advanced Practice Providers on your designated Care Team: Richardson Dopp, PA-C [place], [place] . Daune Perch, NP  Any Other Special Instructions Will Be Listed Below (If Applicable).

## 2019-04-18 NOTE — Progress Notes (Signed)
Virtual Visit via Telephone Note   This visit type was conducted due to national recommendations for restrictions regarding the COVID-19 Pandemic (e.g. social distancing) in an effort to limit this patient's exposure and mitigate transmission in our community.  Due to her co-morbid illnesses, this patient is at least at moderate risk for complications without adequate follow up.  This format is felt to be most appropriate for this patient at this time.  The patient did not have access to video technology/had technical difficulties with video requiring transitioning to audio format only (telephone).  All issues noted in this document were discussed and addressed.  No physical exam could be performed with this format.  Please refer to the patient's chart for her  consent to telehealth for [facility].   Evaluation Performed:  Follow-up visit  Date:  04/18/2019   ID:  [name] Dyer, DOB 11-13-1932, MRN [id number]  Patient Location: Home Provider Location: Office  PCP:  [name], MD  Cardiologist:  [name], MD  Electrophysiologist:  None   Chief Complaint:  SOB   History of Present Illness:    [name] Dyer is a 85 y.o. female with history of Permanent atrial fib on Eliquis, HTN and HL and lower ext edema, dementia.   Last echo 06/07/16 with normal EF 60-65%  pk PA pressure 50 mmHg-moderate pulmonary HTN.  She has hx of bronchitis was treated with antibiotics and steroids.  She is now off these meds.   She has been under stress in caring for her husband, he has been hospitalized and is now back at home.    She has had dizzy she believes is the Neurontin.  Neurology has been adjusting. She has been taking small steps with walking when this comes on.  She also feels jittery.    Pt last seen by Dr. Harrington Challenger 09/20/18 and with dyspnea and fatigue lexiscan ordered.  And 24 hour holter for her a fib.  The nuc was low risk, normal study with EF 66%.  Holter with heart rate  controlled with a fib.    Today she continues with DOE waking up hill from her mailbox -stated she was panting when she gets in the house.  This has been going on for months and she believes it is the same on last visit and we did stress test and monitor.  She also has had BP up to 162/76 but on rare occurrence.  Other times 133/82, 124/77, 141/64 mostly in 992E systolic.  We discussed her diet and she admits to low salt chips at times.  She also has pruritis and her son has brought her an antihistamine to take.  It is her hair and eyebrows mostly.  I asked her to try olive oil in her hair,  She stated she did not have any and they did not have anyone to go to store for them, though she admitted her daughter in law cooks for them.  She has help in the house for housework and her sons help with her husband.  She is stressed with caring for him.    For her neuropathy she is on gabapentin but still with breakthrough pain and uses bio freeze which does help.    With COVID she is only out of the house to go to [employer].  She did complain of a runny nose but has had for months.  No fevers.  No cough.  The patient does not have symptoms concerning for COVID-19 infection (fever, chills,  cough, or new shortness of breath).    Past Medical History:  Diagnosis Date  . Anemia   . Arrhythmia   . Cataract   . Colon polyps   . Community acquired pneumonia Feb 2013   LLL  . Congestive heart failure ([place])   . Hyperlipidemia   . Hypertension   . Memory difficulty 10/01/2016  . Osteoporosis   . Pleural effusion, right Feb 2013  . SVT (supraventricular tachycardia) ([place]) feb 2013  . UTI (lower urinary tract infection)    Past Surgical History:  Procedure Laterality Date  . ABDOMINAL HYSTERECTOMY  1983  . APPENDECTOMY  1983  . COLONOSCOPY    . COLONOSCOPY N/A 06/10/2016   Procedure: COLONOSCOPY;  Surgeon: Manus Gunning, MD;  Location: [facility];  Service: Gastroenterology;  Laterality: N/A;  .  ESOPHAGOGASTRODUODENOSCOPY N/A 06/10/2016   Procedure: ESOPHAGOGASTRODUODENOSCOPY (EGD);  Surgeon: Manus Gunning, MD;  Location: [facility];  Service: Gastroenterology;  Laterality: N/A;  . HEMIARTHROPLASTY HIP  2011   Left femoral neck fracture  . UPPER GASTROINTESTINAL ENDOSCOPY       Current Meds  Medication Sig  . acetaminophen (TYLENOL) 500 MG tablet Take 500 mg by mouth every 6 (six) hours as needed.  Marland Kitchen alendronate (FOSAMAX) 70 MG tablet Take 70 mg by mouth once a week.   . Alpha-Lipoic Acid 200 MG CAPS Take 1 capsule by mouth daily.  Marland Kitchen atenolol (TENORMIN) 25 MG tablet Take 1 tablet in the evening  . atenolol (TENORMIN) 50 MG tablet Take 1 tablet every morning  . B Complex Vitamins (VITAMIN B COMPLEX PO) Take 1 tablet by mouth daily.  . calcium-vitamin D (OSCAL WITH D) 500-200 MG-UNIT tablet Take 1 tablet by mouth 2 (two) times daily.  Marland Kitchen donepezil (ARICEPT) 5 MG tablet Take 1 tablet (5 mg total) by mouth at bedtime.  Marland Kitchen ELIQUIS 2.5 MG TABS tablet TAKE 1 TABLET TWICE A DAY  . estradiol (ESTRACE) 0.5 MG tablet Take 0.5 tablets by mouth every other day.  . furosemide (LASIX) 20 MG tablet Take 1 tablet (20 mg total) by mouth daily.  Marland Kitchen gabapentin (NEURONTIN) 100 MG capsule Take 400 mg by mouth daily.  . Melatonin 10 MG CAPS Take by mouth.  . potassium chloride (K-DUR,KLOR-CON) 10 MEQ tablet Take 1 tablet (10 mEq total) by mouth daily.  . simvastatin (ZOCOR) 10 MG tablet Take 1 tablet by mouth daily.  . traZODone (DESYREL) 50 MG tablet Take 50 mg by mouth at bedtime as needed for sleep (1/2 tab as needed).     Allergies:   Ciprofloxacin and Sulfa antibiotics   Social History   Tobacco Use  . Smoking status: Never Smoker  . Smokeless tobacco: Never Used  Substance Use Topics  . Alcohol use: No  . Drug use: No     Family Hx: The patient's family history includes Colon polyps in her son; Stroke in her father.  ROS:   Please see the history of present illness.     General:no colds or fevers, no weight changes Skin:no rashes or ulcers, + pruritis  HEENT:no blurred vision, no congestion CV:see HPI PUL:see HPI GI:no diarrhea constipation or melena, no indigestion GU:no hematuria, no dysuria MS:no joint pain, no claudication Neuro:no syncope, no lightheadedness, + neuropathy Endo:no diabetes, no thyroid disease  All other systems reviewed and are negative.   Prior CV studies:   The following studies were reviewed today:  24 hour holter 09/29/18 Atrial fibrillatoin   Rates 45 to 126 bpm  Average HR 79 bpm  Occasional PVC Longest pause less than 2 seconds  lexiscan stress 09/30/18 Study Highlights    There was no ST segment deviation noted during stress.  The study is normal.  Nuclear stress EF: 66%. The left ventricular ejection fraction is hyperdynamic (>65%).  This is a low risk study. No evidence of ischemia or previous infarction.       Labs/Other Tests and Data Reviewed:    EKG:  An ECG dated 10/26/18 was personally reviewed today and demonstrated:  a fib rate 93 low QRS voltage but no acute changes.  Recent Labs: 05/05/2018: NT-Pro BNP 1,709 09/20/2018: BUN 20; Creatinine, Ser 1.04; Hemoglobin 13.2; Platelets CANCELED; Potassium 4.2; Sodium 143; TSH 2.620   Recent Lipid Panel No results found for: CHOL, TRIG, HDL, CHOLHDL, LDLCALC, LDLDIRECT  Wt Readings from Last 3 Encounters:  04/18/19 126 lb (57.2 kg)  09/30/18 126 lb (57.2 kg)  09/20/18 126 lb 6.4 oz (57.3 kg)     Objective:    Vital Signs:  BP 133/82   Pulse 82   Ht 5' 4.5" (1.638 m)   Wt 126 lb (57.2 kg)   BMI 21.29 kg/m    VITAL SIGNS:  reviewed  Neuro alert and oriented X 3 follows commands Lungs, can talk in full sentences without SOB or wheezing Psych:  pleasant affect  ASSESSMENT & PLAN:    1. DOE continues but similar to visit with Dr. Harrington Challenger and stress test and holter were stable.  2. Chronic diastolic HF stable, no edema.   3. Permanent a fib  stable 4. Chronic anticoagulation on eliquis, has not missed any doses and no bleeding  5. Neuropathy per PCP 6. Pruritis per PCP  COVID-19 Education: The signs and symptoms of COVID-19 were discussed with the patient and how to seek care for testing (follow up with PCP or arrange E-visit).  The importance of social distancing was discussed today.  Time:   Today, I have spent 20 minutes with the patient with telehealth technology discussing the above problems.     Medication Adjustments/Labs and Tests Ordered: Current medicines are reviewed at length with the patient today.  Concerns regarding medicines are outlined above.   Tests Ordered: No orders of the defined types were placed in this encounter.   Medication Changes: No orders of the defined types were placed in this encounter.   Disposition:  Follow up in 3 month(s)  Signed, Cecilie Kicks, NP  04/18/2019 5:23 PM    [facility]

## 2019-05-20 ENCOUNTER — Other Ambulatory Visit: Payer: Self-pay | Admitting: Internal Medicine

## 2019-05-20 NOTE — Telephone Encounter (Signed)
Eliquis 2.3m refill request received; pt is 85yrs old, Wt-57.3kg, Crea-1.04 on 09/20/2018, last seen by LCecilie Kickson 04/18/2019; will send in refill.

## 2019-07-16 ENCOUNTER — Encounter

## 2019-08-06 ENCOUNTER — Other Ambulatory Visit: Payer: Self-pay | Admitting: Neurology

## 2019-08-11 ENCOUNTER — Telehealth: Payer: Self-pay | Admitting: Internal Medicine

## 2019-08-11 MED ORDER — ATENOLOL 50 MG PO TABS: ORAL_TABLET | ORAL | 3 refills | Status: DC

## 2019-08-11 MED ORDER — ATENOLOL 25 MG PO TABS: ORAL_TABLET | ORAL | 3 refills | Status: DC

## 2019-08-11 NOTE — Telephone Encounter (Signed)
New Message       *STAT* If patient is at the pharmacy, call can be transferred to refill team.   1. Which medications need to be refilled? (please list name of each medication and dose if known) Atenolol 50 mg and Atenolol  25 mg She needs both medications refilled   2. Which pharmacy/location (including street and city if local pharmacy) is [facility]   3. Do they need a 30 day or 90 day supply? 90    She needs the 50 mg to say take 1 in the morning  And the 25 mg To say take 1 in the evening   Pt needs a nurse to call her because she needs to make sure that is how she takes the medication

## 2019-08-11 NOTE — Telephone Encounter (Signed)
Called pt to confirm that she is taking her medication right and that her medications were sent to her pharmacy as requested. If she has any other problems, questions or concerns to give our office a call back. Pt verbalized understanding.

## 2019-08-22 ENCOUNTER — Other Ambulatory Visit: Payer: Self-pay

## 2019-08-22 DIAGNOSIS — Z20822 Contact with and (suspected) exposure to covid-19: Secondary | ICD-10-CM

## 2019-08-23 LAB — NOVEL CORONAVIRUS, NAA: SARS-CoV-2, NAA: NOT DETECTED

## 2019-09-12 ENCOUNTER — Telehealth: Payer: Self-pay | Admitting: Internal Medicine

## 2019-09-12 NOTE — Telephone Encounter (Signed)
Called home number x4 to reach patient.  Line is busy.

## 2019-09-12 NOTE — Telephone Encounter (Signed)
New Message:   Pt says she have been taking Lasix for years. She is now losing a pound or more a day. Pt wants to know if she can back on her Lasix?

## 2019-09-14 ENCOUNTER — Other Ambulatory Visit: Payer: Self-pay

## 2019-09-14 ENCOUNTER — Encounter: Payer: Self-pay | Admitting: Allergy and Immunology

## 2019-09-14 ENCOUNTER — Ambulatory Visit (INDEPENDENT_AMBULATORY_CARE_PROVIDER_SITE_OTHER): Payer: Medicare Other | Admitting: Allergy and Immunology

## 2019-09-14 VITALS — BP 120/60 | HR 70 | Temp 97.3°F | Resp 18 | Ht 60.0 in | Wt 128.0 lb

## 2019-09-14 DIAGNOSIS — L308 Other specified dermatitis: Secondary | ICD-10-CM | POA: Diagnosis not present

## 2019-09-14 DIAGNOSIS — J3089 Other allergic rhinitis: Secondary | ICD-10-CM

## 2019-09-14 DIAGNOSIS — J3489 Other specified disorders of nose and nasal sinuses: Secondary | ICD-10-CM | POA: Diagnosis not present

## 2019-09-14 DIAGNOSIS — L989 Disorder of the skin and subcutaneous tissue, unspecified: Secondary | ICD-10-CM

## 2019-09-14 MED ORDER — FLUOCINOLONE ACETONIDE SCALP 0.01 % EX OIL: TOPICAL_OIL | CUTANEOUS | 5 refills | Status: DC

## 2019-09-14 MED ORDER — IPRATROPIUM BROMIDE 0.06 % NA SOLN: NASAL | 5 refills | Status: DC

## 2019-09-14 NOTE — Progress Notes (Signed)
[place] - [place] - [place] - [place] - [place]   Dear Dr. Daphene Jaeger,  Thank you for referring [name] Dyer to the [facility] on 09/14/2019.   Below is a summation of this patient's evaluation and recommendations.  Thank you for your referral. I will keep you informed about this patient's response to treatment.   If you have any questions please do not hesitate to contact me.   Sincerely,  Jiles Prows, MD Allergy / Immunology [place]   ______________________________________________________________________    NEW PATIENT NOTE  Referring Provider: [name], MD Primary Provider: [name], MD Date of office visit: 09/14/2019    Subjective:   Chief Complaint:  [name] Dyer (DOB: 03-19-1932) is a 85 y.o. female who presents to the clinic on 09/14/2019 with a chief complaint of Pruritis .     HPI: [name] Dyer presents to this clinic in evaluation of 2 main issues.  First, she has constant runny nose.  She thinks that more material runs out of her left nostril than her right nostril.  When she wakes up in the morning she has lots of drainage in her throat and throat clearing and she needs to cough for a few minutes to clear this out.  She cannot smell at all.  She does not have a history of ugly nasal discharge or headaches or significant sneezing.  She has tried multiple agents to control this issue but she has never found something to help with her runny nose.  She thinks that she has used a nasal spray in the past, possibly Flonase, which did not help her.  Second, she has itchiness of her scalp.  She has seen a dermatologist who is treated her with tar shampoo and clobetasol solution 0.05% which may help but she has a difficult time applying enough clobetasol across her scalp to stop the itching.  She has been given hydroxyzine which has not helped.  She has  tried olive oil in her hair which has not helped.  Past Medical History:  Diagnosis Date  . Anemia   . Arrhythmia   . Cataract   . Colon polyps   . Community acquired pneumonia Feb 2013   LLL  . Congestive heart failure ([place])   . Hyperlipidemia   . Hypertension   . Memory difficulty 10/01/2016  . Osteoporosis   . Pleural effusion, right Feb 2013  . SVT (supraventricular tachycardia) ([place]) feb 2013  . UTI (lower urinary tract infection)     Past Surgical History:  Procedure Laterality Date  . ABDOMINAL HYSTERECTOMY  1983  . APPENDECTOMY  1983  . COLONOSCOPY    . COLONOSCOPY N/A 06/10/2016   Procedure: COLONOSCOPY;  Surgeon: Manus Gunning, MD;  Location: [facility];  Service: Gastroenterology;  Laterality: N/A;  . ESOPHAGOGASTRODUODENOSCOPY N/A 06/10/2016   Procedure: ESOPHAGOGASTRODUODENOSCOPY (EGD);  Surgeon: Manus Gunning, MD;  Location: [facility];  Service: Gastroenterology;  Laterality: N/A;  . HEMIARTHROPLASTY HIP  2011   Left femoral neck fracture  . UPPER GASTROINTESTINAL ENDOSCOPY      Allergies as of 09/14/2019      Reactions   Ciprofloxacin Diarrhea   Sulfa Antibiotics Hives, Rash   Severe Rash      Medication List    acetaminophen 500 MG tablet Commonly known as: TYLENOL Take 500 mg by mouth every 6 (six) hours as needed.   alendronate 70 MG tablet Commonly  known as: FOSAMAX Take 70 mg by mouth once a week.   atenolol 50 MG tablet Commonly known as: TENORMIN Take 1 tablet every morning   atenolol 25 MG tablet Commonly known as: TENORMIN Take 1 tablet in the evening   calcium-vitamin D 500-200 MG-UNIT tablet Commonly known as: OSCAL WITH D Take 1 tablet by mouth 2 (two) times daily.   donepezil 5 MG tablet Commonly known as: ARICEPT TAKE 1 TABLET AT BEDTIME   Eliquis 2.5 MG Tabs tablet Generic drug: apixaban TAKE 1 TABLET TWICE A DAY   estradiol 0.5 MG tablet Commonly known as: ESTRACE Take 0.5 tablets by mouth every  other day.   furosemide 20 MG tablet Commonly known as: LASIX Take 1 tablet (20 mg total) by mouth daily.   gabapentin 100 MG capsule Commonly known as: NEURONTIN Take 400 mg by mouth daily.   Melatonin 10 MG Caps Take by mouth.   potassium chloride 10 MEQ tablet Commonly known as: K-DUR Take 1 tablet (10 mEq total) by mouth daily.   simvastatin 10 MG tablet Commonly known as: ZOCOR Take 1 tablet by mouth daily.   traZODone 50 MG tablet Commonly known as: DESYREL Take 50 mg by mouth at bedtime as needed for sleep (1/2 tab as needed).   VITAMIN B COMPLEX PO Take 1 tablet by mouth daily.       Review of systems negative except as noted in HPI / PMHx or noted below:  Review of Systems  Constitutional: Negative.   HENT: Negative.   Eyes: Negative.   Respiratory: Negative.   Cardiovascular: Negative.   Gastrointestinal: Negative.   Genitourinary: Negative.   Musculoskeletal: Negative.   Skin: Negative.   Neurological: Negative.   Endo/Heme/Allergies: Negative.   Psychiatric/Behavioral: Negative.     Family History  Problem Relation Age of Onset  . Colon polyps Son   . Stroke Father   . Cancer Grandchild     Social History   Socioeconomic History  . Marital status: Single    Spouse name: Not on file  . Number of children: 2  . Years of education: 25  . Highest education level: Not on file  Occupational History  . Occupation: Retired  Scientific laboratory technician  . Financial resource strain: Not on file  . Food insecurity    Worry: Not on file    Inability: Not on file  . Transportation needs    Medical: Not on file    Non-medical: Not on file  Tobacco Use  . Smoking status: Never Smoker  . Smokeless tobacco: Never Used  Substance and Sexual Activity  . Alcohol use: No  . Drug use: No  . Sexual activity: Not on file  Lifestyle  . Physical activity    Days per week: Not on file    Minutes per session: Not on file  . Stress: Not on file  Relationships  .  Social Herbalist on phone: Not on file    Gets together: Not on file    Attends religious service: Not on file    Active member of club or organization: Not on file    Attends meetings of clubs or organizations: Not on file    Relationship status: Not on file  . Intimate partner violence    Fear of current or ex partner: Not on file    Emotionally abused: Not on file    Physically abused: Not on file    Forced sexual activity: Not on file  Other Topics Concern  . Not on file  Social History Narrative   Married, retired, 2 sons. Husband is disabled, non-ambulatory. Spends the the winters in [place].   Right-handed   Caffeine: Cheerwine or hot chocolate occasionally but otherwise none    Environmental and Social history  Lives in a house with a dry environment, no animals located inside the household, no carpet in the bedroom, no plastic on the bed, no plastic on the pillow, and no smoking ongoing with inside the household.  Objective:   Vitals:   09/14/19 1528  BP: 120/60  Pulse: 70  Resp: 18  Temp: (!) 97.3 F (36.3 C)  SpO2: 98%   Height: 5' (152.4 cm) Weight: 128 lb (58.1 kg)  Physical Exam Constitutional:      Appearance: She is not diaphoretic.  HENT:     Head: Normocephalic.     Right Ear: Tympanic membrane, ear canal and external ear normal.     Left Ear: Tympanic membrane, ear canal and external ear normal.     Nose: Nose normal. No mucosal edema or rhinorrhea.     Mouth/Throat:     Pharynx: Uvula midline. No oropharyngeal exudate.  Eyes:     Conjunctiva/sclera: Conjunctivae normal.  Neck:     Thyroid: No thyromegaly.     Trachea: Trachea normal. No tracheal tenderness or tracheal deviation.  Cardiovascular:     Rate and Rhythm: Normal rate and regular rhythm.     Heart sounds: Normal heart sounds, S1 normal and S2 normal. No murmur.  Pulmonary:     Effort: No respiratory distress.     Breath sounds: Normal breath sounds. No stridor. No  wheezing or rales.  Lymphadenopathy:     Head:     Right side of head: No tonsillar adenopathy.     Left side of head: No tonsillar adenopathy.     Cervical: No cervical adenopathy.  Skin:    Findings: No erythema or rash.     Nails: There is no clubbing.   Neurological:     Mental Status: She is alert.     Diagnostics: Allergy skin tests were performed.  She demonstrated hypersensitivity to house dust mite  Results of a chest x-ray obtained 05 May 2018 identifies the following:  Enlargement of cardiac silhouette. Mediastinal contours and pulmonary vascularity normal. Lungs clear. No pleural effusion or pneumothorax. Bones demineralized.  Results of a MRI of brain obtained 20 April 2017 notes the following:  The orbits and their contents, paranasal sinuses and calvarium are notable for bilateral lens extractions.  Results of an echocardiogram obtained 09 June 2018 identifies the following:  - Left ventricle: The cavity size was normal. Systolic function was   normal. The estimated ejection fraction was in the range of 55%   to 60%. Wall motion was normal; there were no regional wall   motion abnormalities. Doppler parameters are consistent with   restrictive physiology, indicative of decreased left ventricular   diastolic compliance and/or increased left atrial pressure.   Doppler parameters are consistent with elevated ventricular   end-diastolic filling pressure. - Aortic valve: There was trivial regurgitation. - Mitral valve: Calcified annulus. Mildly thickened leaflets .   There was moderate regurgitation. - Left atrium: The atrium was mildly dilated. - Right ventricle: The cavity size was moderately dilated. Wall   thickness was normal. Systolic function was moderately reduced. - Right atrium: The atrium was moderately dilated. - Tricuspid valve: There was moderate regurgitation. - Pulmonic valve: There was  mild regurgitation. - Pulmonary arteries: Systolic  pressure was moderately increased.   PA peak pressure: 44 mm Hg (S). - Inferior vena cava: The vessel was normal in size. The   respirophasic diameter changes were in the normal range (= 50%),   consistent with normal central venous pressure. - Pericardium, extracardiac: A trivial pericardial effusion was   identified posterior to the heart. Features were not consistent   with tamponade physiology.   Assessment and Plan:    1. Perennial allergic rhinitis   2. Rhinorrhea   3. Inflammatory dermatosis     1.  Allergen avoidance measures - Dust mite  2.  Nasal ipratropium 0.06% -2 sprays each nostril every 6 hours if needed to dry nose  3.  Derma-Smoothe scalp oil -apply to scalp after shower/bath 1-7 times a week  4.  Further evaluation and treatment?  5.  Return to clinic in 4 weeks or earlier if problem  6.  Obtain Fall flu vaccine (and COVID vaccine)  [name] Dyer appears to have some inflammation of her upper airway and chronic rhinorrhea which should respond to a combination of allergen avoidance measures directed against dust mite and the use of nasal ipratropium.  She also has an inflammatory dermatosis of her scalp and she can try Derma-Smoothe scalp oil.  I will regroup with her in 4 weeks to make a determination about further evaluation and treatment based upon her response to this approach.  Jiles Prows, MD Allergy / Immunology [facility]

## 2019-09-14 NOTE — Patient Instructions (Addendum)
  1.  Allergen avoidance measures - Dust mite  2.  Nasal ipratropium 0.06% -2 sprays each nostril every 6 hours if needed to dry nose  3.  Derma-Smoothe scalp oil -apply to scalp after shower/bath 1-7 times a week  4.  Further evaluation and treatment?  5.  Return to clinic in 4 weeks or earlier if problem  6.  Obtain Fall flu vaccine (and COVID vaccine)

## 2019-09-15 ENCOUNTER — Encounter: Payer: Self-pay | Admitting: Allergy and Immunology

## 2019-09-21 ENCOUNTER — Telehealth: Payer: Self-pay | Admitting: Allergy and Immunology

## 2019-09-21 NOTE — Telephone Encounter (Signed)
Please inform patient that it does sound as though she is using it the right way.  Usually about 3 times per week works well.  He can usually stay in overnight.  It may be that she is having some form of side effects secondary to the use of this agent but it is so hard to tell without actually seeing her scalp.  Hopefully she can get transportation arranged so we can take a look at her scalp.

## 2019-09-21 NOTE — Telephone Encounter (Signed)
[name] Dyer cannot drive and it's very difficult for her to get transportation. Is there another option?   She states that this afternoon her scalp is a still sore but is feeling a little better. It was feeling very raw but now it's not quite as bad.

## 2019-09-21 NOTE — Telephone Encounter (Signed)
Patient called and stated that she started the Derma-Smoothe Scalp Oil and has used it twice. The first time she applied it to scalp and left in for 4 hours per instructions. The second time she applied it and left it in all night per instructions. She stated that her scalp is now sore. Patient is wondering how she should be using this, the instructions are not very clear per patient. Please advise.

## 2019-09-21 NOTE — Telephone Encounter (Signed)
Lets have her run by the office and I will take a look at her scalp to see if there is any evidence of inflammation.

## 2019-09-22 NOTE — Telephone Encounter (Signed)
Spoke with patient and schedule an appointment for Tuesday in [place] for Dr. Neldon Mc to look at her scalp. Patient agreed with plan.

## 2019-09-26 NOTE — Telephone Encounter (Signed)
Patient is scheduled with Dr. Harrington Challenger this week. Will bring all medicine bottle with her. Takes lasix once daily and she may skip occasionally when she has to go out somewhere.  She states there are times her memory is bad and she may need to bring her son with her to help with this.

## 2019-09-27 ENCOUNTER — Ambulatory Visit: Payer: Medicare Other | Admitting: Allergy and Immunology

## 2019-09-27 ENCOUNTER — Other Ambulatory Visit: Payer: Self-pay

## 2019-09-27 ENCOUNTER — Encounter: Payer: Self-pay | Admitting: Allergy and Immunology

## 2019-09-27 VITALS — BP 130/80 | HR 102 | Temp 97.9°F | Resp 16

## 2019-09-27 DIAGNOSIS — L308 Other specified dermatitis: Secondary | ICD-10-CM

## 2019-09-27 DIAGNOSIS — L989 Disorder of the skin and subcutaneous tissue, unspecified: Secondary | ICD-10-CM

## 2019-09-27 NOTE — Progress Notes (Signed)
[name] Dyer returns to this clinic to have her scalp examined.  I saw her in this clinic on 14 September 2019 at which point in time we gave her Derma-Smoothe scalp oil.  She contacted me by telephone and I could not really understand the issue associated with whether or not Derma-Smoothe actually did help her or did not help her.  She states today that when she uses the Derma-Smoothe it does help but it just does not help enough.  She has only used it 3 times.  I asked her to consistently use the Derma-Smoothe every day for at least 10 to 14 days and see what type of effect she gets while utilizing this medication on a consistent basis.  She has an appointment to see me in several weeks and will discuss this issue when she returns to this clinic.

## 2019-09-28 ENCOUNTER — Encounter: Payer: Self-pay | Admitting: Allergy and Immunology

## 2019-09-28 NOTE — Progress Notes (Signed)
\    09/29/2019 [name] Dyer   1932-08-10  [id number]  Primary Cardiologist: Dr Harrington Challenger  HPI:  Pt is an 85 y/o female with a history of diastolic CHF, HTN, permanent atrial fib , dementia She cares for her husband at home.  She had a myovue in 09/20/18   It was  low risk   Also a holter montor showed afib that was rate controlled  She was seen by L ingold in April 2020 as televsit  Still complaining of DOE   BP was up   Has SOB with activity     The pt denies CP   Does say he pants at times   Unsteady on feet    Current Outpatient Medications  Medication Sig Dispense Refill  . alendronate (FOSAMAX) 70 MG tablet Take 70 mg by mouth once a week.     Marland Kitchen atenolol (TENORMIN) 25 MG tablet Take 1 tablet in the evening 90 tablet 3  . atenolol (TENORMIN) 50 MG tablet Take 1 tablet every morning 90 tablet 3  . Calcium Carb-Cholecalciferol (CALCIUM + D3 PO) Take by mouth.    . cholecalciferol (VITAMIN D3) 25 MCG (1000 UT) tablet Take 2,000 Units by mouth daily.    . clobetasol (TEMOVATE) 0.05 % external solution APPLY SOLUTION TOPICALLY TO AFFECTED AREA UP TO TWICE DAILY AS NEEDED. DO NOT APPLY TO FACE GROIN OR AXILLA.    Marland Kitchen Cyanocobalamin (VITAMIN B-12) 5000 MCG TBDP Take by mouth.    . donepezil (ARICEPT) 5 MG tablet TAKE 1 TABLET AT BEDTIME 90 tablet 3  . ELIQUIS 2.5 MG TABS tablet TAKE 1 TABLET TWICE A DAY 180 tablet 1  . estradiol (ESTRACE) 0.5 MG tablet Take 0.5 tablets by mouth every other day.    . Fluocinolone Acetonide Scalp 0.01 % OIL Can apply to scalp once daily after bathing as needed. 118.28 mL 5  . furosemide (LASIX) 20 MG tablet Take 1 tablet (20 mg total) by mouth daily. 60 tablet 3  . gabapentin (NEURONTIN) 100 MG capsule Take 400 mg by mouth daily.    Marland Kitchen ipratropium (ATROVENT) 0.06 % nasal spray Can use two sprays in each nostril every six hours if needed to dry up runny nose. 15 mL 5  . Melatonin 10 MG CAPS Take by mouth.    . potassium chloride (K-DUR,KLOR-CON) 10 MEQ  tablet Take 1 tablet (10 mEq total) by mouth daily. 90 tablet 2  . simvastatin (ZOCOR) 10 MG tablet Take 1 tablet by mouth daily.    Marland Kitchen acetaminophen (TYLENOL) 500 MG tablet Take 500 mg by mouth every 6 (six) hours as needed.     No current facility-administered medications for this visit.     Allergies  Allergen Reactions  . Ciprofloxacin Diarrhea  . Sulfa Antibiotics Hives and Rash    Severe Rash  . Succinylsulphathiazole     Past Medical History:  Diagnosis Date  . Anemia   . Arrhythmia   . Cataract   . Colon polyps   . Community acquired pneumonia Feb 2013   LLL  . Congestive heart failure ([place])   . Hyperlipidemia   . Hypertension   . Memory difficulty 10/01/2016  . Osteoporosis   . Pleural effusion, right Feb 2013  . SVT (supraventricular tachycardia) ([place]) feb 2013  . UTI (lower urinary tract infection)     Social History   Socioeconomic History  . Marital status: Single    Spouse name: Not on file  . Number of children:  2  . Years of education: 71  . Highest education level: Not on file  Occupational History  . Occupation: Retired  Scientific laboratory technician  . Financial resource strain: Not on file  . Food insecurity    Worry: Not on file    Inability: Not on file  . Transportation needs    Medical: Not on file    Non-medical: Not on file  Tobacco Use  . Smoking status: Never Smoker  . Smokeless tobacco: Never Used  Substance and Sexual Activity  . Alcohol use: No  . Drug use: No  . Sexual activity: Not on file  Lifestyle  . Physical activity    Days per week: Not on file    Minutes per session: Not on file  . Stress: Not on file  Relationships  . Social Herbalist on phone: Not on file    Gets together: Not on file    Attends religious service: Not on file    Active member of club or organization: Not on file    Attends meetings of clubs or organizations: Not on file    Relationship status: Not on file  . Intimate partner violence    Fear of  current or ex partner: Not on file    Emotionally abused: Not on file    Physically abused: Not on file    Forced sexual activity: Not on file  Other Topics Concern  . Not on file  Social History Narrative   Married, retired, 2 sons. Husband is disabled, non-ambulatory. Spends the the winters in [place].   Right-handed   Caffeine: Cheerwine or hot chocolate occasionally but otherwise none     Family History  Problem Relation Age of Onset  . Colon polyps Son   . Stroke Father   . Cancer Grandchild      Review of Systems: All other systems reviewed and are otherwise negative except as noted above. Blood pressure 130/60, pulse 74, height 5' (1.524 m), weight 125 lb 9.6 oz (57 kg). Sats walking   96 to 97% wawlking    Gen:   Pt is in NAD Neck:   JVP is normal   NO bruits Cardiac Irreg rate and rhythm   Normal S1, S2   ABd is benign.  No hepatomegaty Ext    Triv edema bilaterally  Musculoskel:   Moving all extremities  EKG   Atrial fibrillation  74 bpm  R1R' V1    ASSESSMENT AND PLAN:   1  Dyspnea Pt moving slow   I am not ocnvinced of angina   Volume is not bad   WIll check labs    2   Chronic diastolic CHF   Volume is not bad  Will check BMET atne BNP today    3  Permanent atrial fibrillation   Keep on same regimen   Check labs        [name] MD

## 2019-09-29 ENCOUNTER — Other Ambulatory Visit: Payer: Self-pay

## 2019-09-29 ENCOUNTER — Encounter: Payer: Self-pay | Admitting: Internal Medicine

## 2019-09-29 ENCOUNTER — Ambulatory Visit (INDEPENDENT_AMBULATORY_CARE_PROVIDER_SITE_OTHER): Payer: Medicare Other | Admitting: Internal Medicine

## 2019-09-29 VITALS — BP 130/60 | HR 74 | Ht 60.0 in | Wt 125.6 lb

## 2019-09-29 DIAGNOSIS — E785 Hyperlipidemia, unspecified: Secondary | ICD-10-CM | POA: Diagnosis not present

## 2019-09-29 DIAGNOSIS — I4821 Permanent atrial fibrillation: Secondary | ICD-10-CM | POA: Diagnosis not present

## 2019-09-29 DIAGNOSIS — R0600 Dyspnea, unspecified: Secondary | ICD-10-CM

## 2019-09-29 DIAGNOSIS — R0609 Other forms of dyspnea: Secondary | ICD-10-CM

## 2019-09-29 DIAGNOSIS — I5032 Chronic diastolic (congestive) heart failure: Secondary | ICD-10-CM | POA: Diagnosis not present

## 2019-09-29 DIAGNOSIS — Z7901 Long term (current) use of anticoagulants: Secondary | ICD-10-CM

## 2019-09-29 NOTE — Patient Instructions (Signed)
Medication Instructions:  No changes If you need a refill on your cardiac medications before your next appointment, please call your pharmacy.   Lab work: Today:  CBC, BMET, LIPIDS, TSH, BNP If you have labs (blood work) drawn today and your tests are completely normal, you will receive your results only by: Marland Kitchen MyChart Message (if you have MyChart) OR . A paper copy in the mail If you have any lab test that is abnormal or we need to change your treatment, we will call you to review the results.  Testing/Procedures: none  Follow-Up: At [facility], you and your health needs are our priority.  As part of our continuing mission to provide you with exceptional heart care, we have created designated Provider Care Teams.  These Care Teams include your primary Cardiologist (physician) and Advanced Practice Providers (APPs -  Physician Assistants and Nurse Practitioners) who all work together to provide you with the care you need, when you need it. You will need a follow up appointment in:  6 months.  Please call our office 2 months in advance to schedule this appointment.  You may see [name], MD or one of the following Advanced Practice Providers on your designated Care Team: Richardson Dopp, PA-C [place], [place] . Daune Perch, NP  Any Other Special Instructions Will Be Listed Below (If Applicable).

## 2019-10-01 LAB — CBC
Hematocrit: 41.4 % (ref 34.0–46.6)
Hemoglobin: 13.6 g/dL (ref 11.1–15.9)
MCH: 29.4 pg (ref 26.6–33.0)
MCHC: 32.9 g/dL (ref 31.5–35.7)
MCV: 89 fL (ref 79–97)
RBC: 4.63 x10E6/uL (ref 3.77–5.28)
RDW: 13.4 % (ref 11.7–15.4)
WBC: 12.8 10*3/uL — ABNORMAL HIGH (ref 3.4–10.8)

## 2019-10-01 LAB — LIPID PANEL
Chol/HDL Ratio: 2.9 ratio (ref 0.0–4.4)
Cholesterol, Total: 154 mg/dL (ref 100–199)
HDL: 53 mg/dL (ref >39)
LDL Chol Calc (NIH): 89 mg/dL (ref 0–99)
Triglycerides: 61 mg/dL (ref 0–149)
VLDL Cholesterol Cal: 12 mg/dL (ref 5–40)

## 2019-10-01 LAB — TSH: TSH: 2.44 u[IU]/mL (ref 0.450–4.500)

## 2019-10-01 LAB — BASIC METABOLIC PANEL
BUN/Creatinine Ratio: 31 — ABNORMAL HIGH (ref 12–28)
BUN: 27 mg/dL (ref 8–27)
CO2: 20 mmol/L (ref 20–29)
Calcium: 9.9 mg/dL (ref 8.7–10.3)
Chloride: 104 mmol/L (ref 96–106)
Creatinine, Ser: 0.87 mg/dL (ref 0.57–1.00)
GFR calc Af Amer: 70 mL/min/{1.73_m2} (ref >59)
GFR calc non Af Amer: 61 mL/min/{1.73_m2} (ref >59)
Glucose: 105 mg/dL — ABNORMAL HIGH (ref 65–99)
Potassium: 4.3 mmol/L (ref 3.5–5.2)
Sodium: 141 mmol/L (ref 134–144)

## 2019-10-01 LAB — PRO B NATRIURETIC PEPTIDE: NT-Pro BNP: 3303 pg/mL — ABNORMAL HIGH (ref 0–738)

## 2019-10-03 ENCOUNTER — Telehealth: Payer: Self-pay | Admitting: *Deleted

## 2019-10-03 DIAGNOSIS — I4821 Permanent atrial fibrillation: Secondary | ICD-10-CM

## 2019-10-03 DIAGNOSIS — R0600 Dyspnea, unspecified: Secondary | ICD-10-CM

## 2019-10-03 DIAGNOSIS — R5383 Other fatigue: Secondary | ICD-10-CM

## 2019-10-03 DIAGNOSIS — I10 Essential (primary) hypertension: Secondary | ICD-10-CM

## 2019-10-03 DIAGNOSIS — I5032 Chronic diastolic (congestive) heart failure: Secondary | ICD-10-CM

## 2019-10-03 DIAGNOSIS — E785 Hyperlipidemia, unspecified: Secondary | ICD-10-CM

## 2019-10-03 DIAGNOSIS — Z79899 Other long term (current) drug therapy: Secondary | ICD-10-CM

## 2019-10-03 DIAGNOSIS — R0609 Other forms of dyspnea: Secondary | ICD-10-CM

## 2019-10-03 NOTE — Telephone Encounter (Signed)
-----  Message from [name], MD sent at 10/03/2019  4:16 PM EDT ----- CBC is normal  Thyroid function is normal    Electrolytes and kidney function  Lipids are good   Fluid does appear up some from previous  Would recomm doubling lasix (40 mg )  Every other day   20 mg on odd days F/U in 10 days with BMET and BNP

## 2019-10-03 NOTE — Telephone Encounter (Signed)
Spoke with patient and informed of results/review and recommendations from Dr. Harrington Challenger. She wrote the directions down and wrote her lab appointment down. She verbalizes understanding. She denies a high sodium diet. Rarely eats out, only on Sunday.

## 2019-10-04 MED ORDER — FUROSEMIDE 20 MG PO TABS: ORAL_TABLET | ORAL | 3 refills | Status: DC

## 2019-10-04 NOTE — Telephone Encounter (Signed)
Spoke with the pt and she needed to be sure that she is to continue with her K... she also needed to move her lab appt to 10/14/19.

## 2019-10-04 NOTE — Telephone Encounter (Signed)
Follow Up  Patient is calling to follow up about yesterday's conversation. Patient is wanting a call back.

## 2019-10-05 ENCOUNTER — Telehealth: Payer: Self-pay | Admitting: Allergy and Immunology

## 2019-10-05 NOTE — Telephone Encounter (Signed)
Please inform patient that she should start OTC Nasacort-1 spray each nostril 1 time per day.  It will probably take 2 to 3 weeks to work before her congestion and runny nose starts to resolve.  We will discuss this further when she returns for her RV.

## 2019-10-05 NOTE — Telephone Encounter (Signed)
Isabela called in and states her nose is really "clogged"  She states that when she sprays the nasal spray in her nostrils that she doesn't think any of it is making it through.  She she had also dried drops and she is still getting no relief.  She feels "blocked" but her nose is running and she is constantly sneezing.  [name] Dyer would like to know if there is anything that can be done, and is she doing something wrong?  Please advise.

## 2019-10-06 NOTE — Telephone Encounter (Signed)
Spoke with patient and informed her of Dr. Bruna Potter suggestions. She stated that her nose felt like she could "sneeze" and stated her nose bleeds sometimes when she blows it so I suggested that she use some nasal saline to keep her nose from being so dry and irritated. Patient stated she would have her son pick up Nasacort for her and that she would let us know if this didn't help.

## 2019-10-11 ENCOUNTER — Telehealth: Payer: Self-pay

## 2019-10-11 NOTE — Telephone Encounter (Signed)
I spoke to the patient who is calling because she is scheduled for labs on 10/16.  She has developed a sinus infection, but does get these often and wanted to know if she should reschedule.  I told her to continue monitoring and update [place] if symptoms worsen.  She verbalized understanding.

## 2019-10-12 ENCOUNTER — Other Ambulatory Visit: Payer: Medicare Other

## 2019-10-12 ENCOUNTER — Telehealth: Payer: Self-pay

## 2019-10-12 NOTE — Telephone Encounter (Signed)
She verbalized that she has been using it daily since her appointment on 9/16.

## 2019-10-12 NOTE — Telephone Encounter (Signed)
Please inform [name] Dyer that if she has used the scalp oil for 2 weeks on a pretty consistent basis and it has not helped her then it is probably not going to help.  What is the duration of use ?

## 2019-10-12 NOTE — Telephone Encounter (Signed)
Patient called with a concern about her Fluocinolone scalp oil. She is concerned that she has been using it daily and is still experiencing scalp itch and feels like her scalp is on fire. She wants to know if you would like her to continue to use it before she orders another bottle. Please advise.

## 2019-10-13 ENCOUNTER — Other Ambulatory Visit: Payer: Medicare Other

## 2019-10-14 ENCOUNTER — Other Ambulatory Visit: Payer: Self-pay

## 2019-10-14 ENCOUNTER — Other Ambulatory Visit: Payer: Medicare Other | Admitting: *Deleted

## 2019-10-14 ENCOUNTER — Encounter (INDEPENDENT_AMBULATORY_CARE_PROVIDER_SITE_OTHER): Payer: Self-pay

## 2019-10-14 DIAGNOSIS — I4821 Permanent atrial fibrillation: Secondary | ICD-10-CM

## 2019-10-14 DIAGNOSIS — Z79899 Other long term (current) drug therapy: Secondary | ICD-10-CM

## 2019-10-14 DIAGNOSIS — I10 Essential (primary) hypertension: Secondary | ICD-10-CM

## 2019-10-14 DIAGNOSIS — I5032 Chronic diastolic (congestive) heart failure: Secondary | ICD-10-CM

## 2019-10-14 DIAGNOSIS — E785 Hyperlipidemia, unspecified: Secondary | ICD-10-CM

## 2019-10-14 DIAGNOSIS — R0609 Other forms of dyspnea: Secondary | ICD-10-CM

## 2019-10-14 DIAGNOSIS — R0600 Dyspnea, unspecified: Secondary | ICD-10-CM

## 2019-10-14 DIAGNOSIS — R5383 Other fatigue: Secondary | ICD-10-CM

## 2019-10-14 LAB — BASIC METABOLIC PANEL
BUN/Creatinine Ratio: 26 (ref 12–28)
BUN: 24 mg/dL (ref 8–27)
CO2: 22 mmol/L (ref 20–29)
Calcium: 9.2 mg/dL (ref 8.7–10.3)
Chloride: 102 mmol/L (ref 96–106)
Creatinine, Ser: 0.92 mg/dL (ref 0.57–1.00)
GFR calc Af Amer: 66 mL/min/{1.73_m2} (ref >59)
GFR calc non Af Amer: 57 mL/min/{1.73_m2} — ABNORMAL LOW (ref >59)
Glucose: 81 mg/dL (ref 65–99)
Potassium: 4 mmol/L (ref 3.5–5.2)
Sodium: 141 mmol/L (ref 134–144)

## 2019-10-14 LAB — PRO B NATRIURETIC PEPTIDE: NT-Pro BNP: 2724 pg/mL — ABNORMAL HIGH (ref 0–738)

## 2019-10-18 ENCOUNTER — Other Ambulatory Visit: Payer: Self-pay | Admitting: *Deleted

## 2019-10-18 MED ORDER — IPRATROPIUM BROMIDE 0.06 % NA SOLN: NASAL | 3 refills | Status: DC

## 2019-10-18 NOTE — Telephone Encounter (Signed)
Please inform patient that we will to further discuss this during RV.

## 2019-10-18 NOTE — Telephone Encounter (Signed)
Please refill ipratropium nose spray

## 2019-10-18 NOTE — Telephone Encounter (Signed)
Rx sent.

## 2019-10-18 NOTE — Telephone Encounter (Signed)
Informed patient. [name] Dyer states that she ordered a new bottle of the scalp oil and will continue to use it until her RV next week. Also, she is almost out of her ipratropium bromide nasal spray and is wondering if she is supposed to contiue to use this. She feels like it does help. If so, she'll need a refill sent to [facility] on [address].

## 2019-10-25 ENCOUNTER — Ambulatory Visit: Payer: Medicare Other | Admitting: Allergy and Immunology

## 2019-10-26 ENCOUNTER — Encounter: Payer: Self-pay | Admitting: Allergy and Immunology

## 2019-10-26 ENCOUNTER — Telehealth: Payer: Self-pay

## 2019-10-26 ENCOUNTER — Ambulatory Visit (INDEPENDENT_AMBULATORY_CARE_PROVIDER_SITE_OTHER): Payer: Medicare Other | Admitting: Allergy and Immunology

## 2019-10-26 ENCOUNTER — Other Ambulatory Visit: Payer: Self-pay

## 2019-10-26 VITALS — BP 118/72 | HR 80 | Temp 98.0°F | Resp 16

## 2019-10-26 DIAGNOSIS — G500 Trigeminal neuralgia: Secondary | ICD-10-CM

## 2019-10-26 DIAGNOSIS — L989 Disorder of the skin and subcutaneous tissue, unspecified: Secondary | ICD-10-CM

## 2019-10-26 DIAGNOSIS — L308 Other specified dermatitis: Secondary | ICD-10-CM | POA: Diagnosis not present

## 2019-10-26 DIAGNOSIS — J3489 Other specified disorders of nose and nasal sinuses: Secondary | ICD-10-CM

## 2019-10-26 DIAGNOSIS — H6983 Other specified disorders of Eustachian tube, bilateral: Secondary | ICD-10-CM

## 2019-10-26 DIAGNOSIS — J3089 Other allergic rhinitis: Secondary | ICD-10-CM | POA: Diagnosis not present

## 2019-10-26 MED ORDER — FLUOCINOLONE ACETONIDE SCALP 0.01 % EX OIL: TOPICAL_OIL | CUTANEOUS | 5 refills | Status: DC

## 2019-10-26 NOTE — Patient Instructions (Signed)
  1.   Only use OTC shampoo 2 times per week. Other days only use water.  2.  Nasal ipratropium 0.06% -2 sprays each nostril every 6 hours if needed to dry nose  3.  Derma-Smoothe scalp oil -apply to scalp and neck after shower/bath 1-7 times a week  4.  Visit with [facility] ENT for ear and face pain issue.  5.  Return to clinic in December 2020 or earlier if problem  6.  Obtain Fall flu vaccine (and COVID vaccine)

## 2019-10-26 NOTE — Progress Notes (Signed)
[facility] [place] [facility]   Follow-up Note  Referring Provider: [name], MD Primary Provider: [name], MD Date of Office Visit: 10/26/2019  Subjective:   [name] Dyer (DOB: 15-Oct-1932) is a 85 y.o. female who returns to the [facility] on 10/26/2019 in re-evaluation of the following:  HPI: [name] Dyer returns to this clinic in evaluation of her chronic rhinorrhea and itchiness of her scalp.  She was last seen in this clinic during her initial evaluation of 14 September 2019.  Her chronic rhinorrhea is adequately treated with nasal ipratropium twice a day.  She does have some issues occasionally with her nose burning and she develops this left-sided facial pain on occasion that appears to be relatively acute and can last several hours to a day and is not associated with any systemic or constitutional symptoms.  She did try some Nasacort and this may or may not have helped this issue.  In addition, she complains of her right ear popping and she needs to move her ear around to try and clear out that popping and overall there is just a really bad feeling in her right ear.  Regarding her scalp itchiness, this is somewhat better while using her topical Derma-Smoothe on her scalp just about every day.  Around 4 PM in the afternoon she places Derma-Smoothe on her scalp and then around 10 PM at night she takes a shower and uses shampoo.  She has noticed that she has developed a little bit of itchiness around her neck and she has been excoriating this area.  Allergies as of 10/26/2019      Reactions   Ciprofloxacin Diarrhea   Sulfa Antibiotics Hives, Rash   Severe Rash   Succinylsulphathiazole       Medication List      acetaminophen 500 MG tablet Commonly known as: TYLENOL Take 500 mg by mouth every 6 (six) hours as needed.   alendronate 70 MG tablet Commonly known as: FOSAMAX Take 70 mg by mouth once a week.   atenolol 50  MG tablet Commonly known as: TENORMIN Take 1 tablet every morning   atenolol 25 MG tablet Commonly known as: TENORMIN Take 1 tablet in the evening   CALCIUM + D3 PO Take by mouth.   cholecalciferol 25 MCG (1000 UT) tablet Commonly known as: VITAMIN D3 Take 2,000 Units by mouth daily.   clobetasol 0.05 % external solution Commonly known as: TEMOVATE APPLY SOLUTION TOPICALLY TO AFFECTED AREA UP TO TWICE DAILY AS NEEDED. DO NOT APPLY TO FACE GROIN OR AXILLA.   donepezil 5 MG tablet Commonly known as: ARICEPT TAKE 1 TABLET AT BEDTIME   Eliquis 2.5 MG Tabs tablet Generic drug: apixaban TAKE 1 TABLET TWICE A DAY   estradiol 0.5 MG tablet Commonly known as: ESTRACE Take 0.5 tablets by mouth every other day.   Fluocinolone Acetonide Scalp 0.01 % Oil Apply to scalp and neck after shower/bath 1-7 times a week. What changed: additional instructions Changed by: ERIC Kevan Rosebush, MD   furosemide 20 MG tablet Commonly known as: LASIX Take one tablet (20 mg) by mouth every other day alternating with two tablets (40 mg) on the opposite days.   gabapentin 100 MG capsule Commonly known as: NEURONTIN Take 400 mg by mouth daily.   ipratropium 0.06 % nasal spray Commonly known as: ATROVENT Can use two sprays in each nostril every six hours if needed to dry up runny nose.   Melatonin  10 MG Caps Take by mouth.   potassium chloride 10 MEQ tablet Commonly known as: KLOR-CON Take 1 tablet (10 mEq total) by mouth daily.   simvastatin 10 MG tablet Commonly known as: ZOCOR Take 1 tablet by mouth daily.   Vitamin B-12 5000 MCG Tbdp Take by mouth.       Past Medical History:  Diagnosis Date  . Anemia   . Arrhythmia   . Cataract   . Colon polyps   . Community acquired pneumonia Feb 2013   LLL  . Congestive heart failure ([place])   . Hyperlipidemia   . Hypertension   . Memory difficulty 10/01/2016  . Osteoporosis   . Pleural effusion, right Feb 2013  . SVT (supraventricular  tachycardia) ([place]) feb 2013  . UTI (lower urinary tract infection)     Past Surgical History:  Procedure Laterality Date  . ABDOMINAL HYSTERECTOMY  1983  . APPENDECTOMY  1983  . COLONOSCOPY    . COLONOSCOPY N/A 06/10/2016   Procedure: COLONOSCOPY;  Surgeon: Manus Gunning, MD;  Location: [facility];  Service: Gastroenterology;  Laterality: N/A;  . ESOPHAGOGASTRODUODENOSCOPY N/A 06/10/2016   Procedure: ESOPHAGOGASTRODUODENOSCOPY (EGD);  Surgeon: Manus Gunning, MD;  Location: [facility];  Service: Gastroenterology;  Laterality: N/A;  . HEMIARTHROPLASTY HIP  2011   Left femoral neck fracture  . UPPER GASTROINTESTINAL ENDOSCOPY      Review of systems negative except as noted in HPI / PMHx or noted below:  Review of Systems  Constitutional: Negative.   HENT: Negative.   Eyes: Negative.   Respiratory: Negative.   Cardiovascular: Negative.   Gastrointestinal: Negative.   Genitourinary: Negative.   Musculoskeletal: Negative.   Skin: Negative.   Neurological: Negative.   Endo/Heme/Allergies: Negative.   Psychiatric/Behavioral: Negative.      Objective:   Vitals:   10/26/19 1126  BP: 118/72  Pulse: 80  Resp: 16  Temp: 98 F (36.7 C)  SpO2: 98%          Physical Exam Constitutional:      Appearance: She is not diaphoretic.  HENT:     Head: Normocephalic.     Right Ear: Tympanic membrane, ear canal and external ear normal. There is impacted cerumen.     Left Ear: Tympanic membrane, ear canal and external ear normal. There is impacted cerumen.     Nose: Nose normal. No mucosal edema or rhinorrhea.     Mouth/Throat:     Pharynx: Uvula midline. No oropharyngeal exudate.  Eyes:     Conjunctiva/sclera: Conjunctivae normal.  Neck:     Thyroid: No thyromegaly.     Trachea: Trachea normal. No tracheal tenderness or tracheal deviation.  Cardiovascular:     Rate and Rhythm: Normal rate and regular rhythm.     Heart sounds: Normal heart sounds, S1 normal  and S2 normal. No murmur.  Pulmonary:     Effort: No respiratory distress.     Breath sounds: Normal breath sounds. No stridor. No wheezing or rales.  Lymphadenopathy:     Head:     Right side of head: No tonsillar adenopathy.     Left side of head: No tonsillar adenopathy.     Cervical: No cervical adenopathy.  Skin:    Findings: Rash (Patchy erythema right subclavicular area with evidence of excoriation.) present. No erythema.     Nails: There is no clubbing.   Neurological:     Mental Status: She is alert.     Diagnostics: none  Assessment and  Plan:   1. Inflammatory dermatosis   2. Perennial allergic rhinitis   3. Rhinorrhea   4. Facial pain syndrome   5. Dysfunction of both eustachian tubes     1.  Only use OTC shampoo 2 times per week. Other days only use water.  2.  Nasal ipratropium 0.06% -2 sprays each nostril every 6 hours if needed to dry nose  3.  Derma-Smoothe scalp oil -apply to scalp and neck after shower/bath 1-7 times a week  4.  Visit with [facility] ENT for ear and face pain issue.  5.  Return to clinic in December 2020 or earlier if problem  6.  Obtain Fall flu vaccine (and COVID vaccine)  Quinci seems to be somewhat better regarding her scalp but I would like for her to minimize use of over-the-counter shampoo to just twice a week and be a little bit more consistent about using Derma-Smoothe scalp oil and she can apply some of the scalp oil around her neck to see if this helps her neck issue as well.  I would like for her to see ENT regarding her ear and face pain issue.  I will see her back in this clinic in December 2020 or earlier if there is a problem.  Allena Katz, MD Allergy / Immunology [place]

## 2019-10-26 NOTE — Telephone Encounter (Signed)
Order placed for ambulatory referral to ent due to ear/face pain. This was sent to [facility]NT.

## 2019-10-27 ENCOUNTER — Encounter: Payer: Self-pay | Admitting: Allergy and Immunology

## 2019-10-27 NOTE — Telephone Encounter (Signed)
Referral placed to [facility] ENT.  Will follow back up with their office in a few days.   Thanks

## 2019-11-03 ENCOUNTER — Telehealth: Payer: Self-pay | Admitting: Internal Medicine

## 2019-11-03 NOTE — Telephone Encounter (Signed)
Pt c/o swelling: STAT is pt has developed SOB within 24 hours  1) How much weight have you gained and in what time span? Patient hasn't weighed herself   2) If swelling, where is the swelling located? Ankles and feet  3) Are you currently taking a fluid pill? yes  4) Are you currently SOB? No   5) Do you have a log of your daily weights (if so, list)? No   6) Have you gained 3 pounds in a day or 5 pounds in a week? no  7) Have you traveled recently? No  Patient states she takes double lasix every other day. She wants to know what more she can do. He swelling has gotten worse she states.

## 2019-11-03 NOTE — Telephone Encounter (Signed)
Swelling in legs was doing good alternating lasix 20 mg daily with 40 mg every other day.  Now legs are getting "fatter and fatter". Pt denies SOB and chest pain.  Is not weighing daily. Trying to elevate legs when she can and doesn't see much effect.  Doesn't rest much caring for husband. She is wearing compression stockings every day.  Eating meals that daughter in law prepares.  Doesn't use salt. She doesn't give clear response on whether urine output is better on days she takes the 40 mg dose of lasix.   Adv patient to increase lasix to 40 mg daily for next 3 days and then Monday go back to 20 mg alt with 40 mg daily. Adv to call back on Monday if not any better.

## 2019-11-09 ENCOUNTER — Telehealth: Payer: Self-pay | Admitting: Internal Medicine

## 2019-11-09 DIAGNOSIS — M316 Other giant cell arteritis: Secondary | ICD-10-CM | POA: Insufficient documentation

## 2019-11-09 DIAGNOSIS — R21 Rash and other nonspecific skin eruption: Secondary | ICD-10-CM | POA: Insufficient documentation

## 2019-11-09 DIAGNOSIS — J310 Chronic rhinitis: Secondary | ICD-10-CM | POA: Insufficient documentation

## 2019-11-09 DIAGNOSIS — G500 Trigeminal neuralgia: Secondary | ICD-10-CM | POA: Insufficient documentation

## 2019-11-09 NOTE — Telephone Encounter (Signed)
Called pt to inform her that her medication was already at her pharmacy and if she has any other problems, questions or concerns to call the office back. Pt verbalized understanding.

## 2019-11-09 NOTE — Telephone Encounter (Signed)
New message    *STAT* If patient is at the pharmacy, call can be transferred to refill team.   1. Which medications need to be refilled? (please list name of each medication and dose if known) furosemide (LASIX) 20 MG tablet  2. Which pharmacy/location (including street and city if local pharmacy) is medication to be sent to?[place], [place].  3. Do they need a 30 day or 90 day supply? [place]

## 2019-11-14 NOTE — Telephone Encounter (Signed)
Follow Up  Patient is calling in stating that the correct prescription is not at the pharmacy. Patient states that she was instructed to take double what she usually takes and needs the prescription called into [place]. Please assist.

## 2019-11-14 NOTE — Telephone Encounter (Signed)
Called pt and called pt's pharmacy to see what was going on with pt's Rx. The pharmacy stated that they do have the prescription, but pt's insurance will not cover it because pt got an old prescription on 09/29/19, I explained to the pharmacy that the pt's medication Furosemide was increased and sent in on 10/04/19 and the pharmacist stated that he would call the insurance to try and get this approved. I called the pt and explained this to the pt. Pt verbalized understanding.

## 2019-11-16 ENCOUNTER — Other Ambulatory Visit: Payer: Self-pay | Admitting: Internal Medicine

## 2019-11-16 NOTE — Telephone Encounter (Signed)
Eliquis 2.76m refill request received. Pt is 85yrold, weight-57kg, Crea-0.92 on 10/14/2019, Diagnosis-Afib and last seen by Dr. RoHarrington Challengern 09/29/2019. Dose is appropriate based on dosing criteria. Will send in refill to requested pharmacy.

## 2019-12-22 ENCOUNTER — Telehealth: Payer: Self-pay | Admitting: Internal Medicine

## 2019-12-22 DIAGNOSIS — R609 Edema, unspecified: Secondary | ICD-10-CM

## 2019-12-22 DIAGNOSIS — Z79899 Other long term (current) drug therapy: Secondary | ICD-10-CM

## 2019-12-22 NOTE — Telephone Encounter (Signed)
Agree with plan for lasix increase  Watch salt/limit

## 2019-12-22 NOTE — Telephone Encounter (Signed)
I spoke with pt. She reports swelling in her lower legs which does not go away. She is trying to keep legs elevated but has trouble doing this as she is caring for her husband.  Swelling causes pain in feet/ankles.  Is watching salt intake.  Has shortness of breath with exertion but this has not worsened.  Current lasix dose is 20 mg alternating with 40 mg.  She confirms she is taking this. She started weighing daily around November 10. Baseline weight seems to be 124-125 lbs.  Pt states this is the first time she has weighed 128 lbs. In previous phone note from 11/5  pt had swelling and was instructed to increase lasix to 40 mg daily for 3 days and then go back to normal dose.  I instructed pt to do this again. She will call early next week with an update on swelling and weight.

## 2019-12-22 NOTE — Telephone Encounter (Signed)
  Pt c/o swelling: STAT is pt has developed SOB within 24 hours  1) How much weight have you gained and in what time span? Not sure, it has been steadily increasing.  2) If swelling, where is the swelling located? legs  3) Are you currently taking a fluid pill? yes  4) Are you currently SOB? no  5) Do you have a log of your daily weights (if so, list)?   12/24 128, 12/23 127.8, 12/22 127.6, 12/21 126.6, 12/20 127.8  6) Have you gained 3 pounds in a day or 5 pounds in a week? No  7) Have you traveled recently? No  Patient calling stating her feet and legs are still swelling and her weight has been steadily increasing. She says her legs and ankle are very sore. She would like a nurse to call her back.

## 2019-12-28 ENCOUNTER — Other Ambulatory Visit: Payer: Self-pay

## 2019-12-28 ENCOUNTER — Ambulatory Visit (INDEPENDENT_AMBULATORY_CARE_PROVIDER_SITE_OTHER): Payer: Medicare Other | Admitting: Allergy and Immunology

## 2019-12-28 VITALS — BP 132/68 | HR 80 | Resp 22

## 2019-12-28 DIAGNOSIS — L299 Pruritus, unspecified: Secondary | ICD-10-CM | POA: Diagnosis not present

## 2019-12-28 DIAGNOSIS — L308 Other specified dermatitis: Secondary | ICD-10-CM

## 2019-12-28 DIAGNOSIS — L989 Disorder of the skin and subcutaneous tissue, unspecified: Secondary | ICD-10-CM

## 2019-12-28 DIAGNOSIS — G500 Trigeminal neuralgia: Secondary | ICD-10-CM

## 2019-12-28 DIAGNOSIS — J3089 Other allergic rhinitis: Secondary | ICD-10-CM

## 2019-12-28 MED ORDER — HALOBETASOL PROPIONATE 0.05 % EX CREA: TOPICAL_CREAM | CUTANEOUS | 0 refills | Status: DC

## 2019-12-28 NOTE — Patient Instructions (Addendum)
  1.   Only use OTC shampoo 2 times per week. Other days only use water.  2.  Nasal ipratropium 0.06% -2 sprays each nostril every 6 hours if needed to dry nose  3.  Derma-Smoothe scalp oil -apply to scalp and neck after shower/bath 1-7 times a week  4. Can apply Ultravate cream to red scaly areas 0-3 times a week during skin flareup.  Attempt to minimize use to lowest dose required  5. Return to clinic in 6 months or earlier if problem

## 2019-12-28 NOTE — Progress Notes (Signed)
[facility] [place] [facility] [place]   Follow-up Note  Referring Provider: [name], MD Primary Provider: [name], MD Date of Office Visit: 12/28/2019  Subjective:   [name] Dyer (DOB: 03/04/1932) is a 85 y.o. female who returns to the [facility] on 12/28/2019 in re-evaluation of the following:  HPI: [name] Dyer returns to this clinic in evaluation of her pruritic disorder, inflammatory dermatosis, and chronic rhinorrhea.  She was last seen in this clinic on 26 October 2019.  The use of Derma-Smoothe scalp oil has helped her somewhat yet she still develops these intermittent patches of itchy red skin.  She has 1 behind her right ear at this point in time.  Her nasal ipratropium works quite well for her chronic rhinorrhea.  She apparently did have a ENT evaluation for an issue with ETD and right ear popping and facial pain syndrome and it does not sound as though much has helped from that evaluation.  Apparently she had a CT scan of her head which was normal.  She did receive the flu vaccine.  Allergies as of 12/28/2019      Reactions   Ciprofloxacin Diarrhea   Sulfa Antibiotics Hives, Rash   Severe Rash   Succinylsulphathiazole       Medication List      acetaminophen 500 MG tablet Commonly known as: TYLENOL Take 500 mg by mouth every 6 (six) hours as needed.   alendronate 70 MG tablet Commonly known as: FOSAMAX Take 70 mg by mouth once a week.   atenolol 50 MG tablet Commonly known as: TENORMIN Take 1 tablet every morning   atenolol 25 MG tablet Commonly known as: TENORMIN Take 1 tablet in the evening   CALCIUM + D3 PO Take by mouth.   cholecalciferol 25 MCG (1000 UT) tablet Commonly known as: VITAMIN D3 Take 2,000 Units by mouth daily.   clobetasol 0.05 % external solution Commonly known as: TEMOVATE APPLY SOLUTION TOPICALLY TO AFFECTED AREA UP TO TWICE DAILY AS NEEDED. DO NOT APPLY TO FACE GROIN OR  AXILLA.   donepezil 5 MG tablet Commonly known as: ARICEPT TAKE 1 TABLET AT BEDTIME   Eliquis 2.5 MG Tabs tablet Generic drug: apixaban TAKE 1 TABLET TWICE A DAY   estradiol 0.5 MG tablet Commonly known as: ESTRACE Take 0.5 tablets by mouth every other day.   Fluocinolone Acetonide Scalp 0.01 % Oil Apply to scalp and neck after shower/bath 1-7 times a week.   furosemide 20 MG tablet Commonly known as: LASIX Take one tablet (20 mg) by mouth every other day alternating with two tablets (40 mg) on the opposite days.   gabapentin 100 MG capsule Commonly known as: NEURONTIN Take 400 mg by mouth daily.   halobetasol 0.05 % cream Commonly known as: ULTRAVATE Can apply thin layer to red scaly areas as directed up to three times a week as needed. Started by: Jiles Prows, MD   ipratropium 0.06 % nasal spray Commonly known as: ATROVENT Can use two sprays in each nostril every six hours if needed to dry up runny nose.   Melatonin 10 MG Caps Take by mouth.   potassium chloride 10 MEQ tablet Commonly known as: KLOR-CON Take 1 tablet (10 mEq total) by mouth daily.   simvastatin 10 MG tablet Commonly known as: ZOCOR Take 1 tablet by mouth daily.   Vitamin B-12 5000 MCG Tbdp Take by mouth.       Past Medical History:  Diagnosis Date  . Anemia   . Arrhythmia   . Cataract   . Colon polyps   . Community acquired pneumonia Feb 2013   LLL  . Congestive heart failure ([place])   . Hyperlipidemia   . Hypertension   . Memory difficulty 10/01/2016  . Osteoporosis   . Pleural effusion, right Feb 2013  . SVT (supraventricular tachycardia) ([place]) feb 2013  . UTI (lower urinary tract infection)     Past Surgical History:  Procedure Laterality Date  . ABDOMINAL HYSTERECTOMY  1983  . APPENDECTOMY  1983  . COLONOSCOPY    . COLONOSCOPY N/A 06/10/2016   Procedure: COLONOSCOPY;  Surgeon: Manus Gunning, MD;  Location: [facility];  Service: Gastroenterology;  Laterality:  N/A;  . ESOPHAGOGASTRODUODENOSCOPY N/A 06/10/2016   Procedure: ESOPHAGOGASTRODUODENOSCOPY (EGD);  Surgeon: Manus Gunning, MD;  Location: [facility];  Service: Gastroenterology;  Laterality: N/A;  . HEMIARTHROPLASTY HIP  2011   Left femoral neck fracture  . UPPER GASTROINTESTINAL ENDOSCOPY      Review of systems negative except as noted in HPI / PMHx or noted below:  Review of Systems  Constitutional: Negative.   HENT: Negative.   Eyes: Negative.   Respiratory: Negative.   Cardiovascular: Negative.   Gastrointestinal: Negative.   Genitourinary: Negative.   Musculoskeletal: Negative.   Skin: Negative.   Neurological: Negative.   Endo/Heme/Allergies: Negative.   Psychiatric/Behavioral: Negative.      Objective:   Vitals:   12/28/19 1209  BP: 132/68  Pulse: 80  Resp: (!) 22          Physical Exam Constitutional:      Appearance: She is not diaphoretic.  HENT:     Head: Normocephalic.     Right Ear: Tympanic membrane, ear canal and external ear normal.     Left Ear: Tympanic membrane, ear canal and external ear normal.     Nose: Nose normal. No mucosal edema or rhinorrhea.     Mouth/Throat:     Pharynx: Uvula midline. No oropharyngeal exudate.  Eyes:     Conjunctiva/sclera: Conjunctivae normal.  Neck:     Thyroid: No thyromegaly.     Trachea: Trachea normal. No tracheal tenderness or tracheal deviation.  Cardiovascular:     Rate and Rhythm: Normal rate and regular rhythm.     Heart sounds: Normal heart sounds, S1 normal and S2 normal. No murmur.  Pulmonary:     Effort: No respiratory distress.     Breath sounds: Normal breath sounds. No stridor. No wheezing or rales.  Lymphadenopathy:     Head:     Right side of head: No tonsillar adenopathy.     Left side of head: No tonsillar adenopathy.     Cervical: No cervical adenopathy.  Skin:    Findings: No erythema or rash.     Nails: There is no clubbing.  Neurological:     Mental Status: She is  alert.     Diagnostics:    Results of a sinus CT scan obtained 09 November 2019 identified the following:  Paranasal sinuses:  Frontal: Trace mucosal thickening in the left frontal sinus. Frontalrecesses are patent.  Ethmoid: Normally aerated.  Maxillary: Normally aerated.  Sphenoid: Normally aerated. Patent sphenoethmoidal recesses.  Right ostiomeatal unit: Patent.  Left ostiomeatal unit: Patent.  Nasal passages: Patent. Intact nasal septum is mildly deviated tothe right.    Assessment and Plan:   1. Pruritic disorder   2. Inflammatory dermatosis   3. Perennial allergic rhinitis  4. Facial pain syndrome     1.   Only use OTC shampoo 2 times per week. Other days only use water.  2.  Nasal ipratropium 0.06% -2 sprays each nostril every 6 hours if needed to dry nose  3.  Derma-Smoothe scalp oil -apply to scalp and neck after shower/bath 1-7 times a week  4. Can apply Ultravate cream to red scaly areas 0-3 times a week during skin flareup.  Attempt to minimize use to lowest dose required  5. Return to clinic in 6 months or earlier if problem  I have given Novice a high potency topical steroid to be used very infrequently to areas of inflamed skin that pop up while she continues on intermittent dose of Derma-Smoothe scalp oil.  She will keep in contact with me noting her response to this approach.  She can continue nasal ipratropium which has worked very well for her chronic rhinorrhea.  I will see her back in this clinic in 6 months or earlier if there is a problem.  Allena Katz, MD Allergy / Immunology [place]

## 2019-12-29 ENCOUNTER — Encounter: Payer: Self-pay | Admitting: Allergy and Immunology

## 2019-12-29 NOTE — Telephone Encounter (Signed)
The patient is calling because she continues to have swelling in her feet and ankles.  She is not SOB or has not had any significant weight gain.  She tried Lasix 40 mg for 3 days and this seemed to improve her swelling, but was told to then restart 20 mg Daily.    She does not intake any salt.  She will try Lasix 40 mg Daily through the weekend and call us on Monday with an update.  She will keep feet elevated and monitor closely.

## 2019-12-29 NOTE — Telephone Encounter (Signed)
   Pt c/o swelling: STAT is pt has developed SOB within 24 hours  1) How much weight have you gained and in what time span? Not sure.   2) If swelling, where is the swelling located? Legs and feet  3) Are you currently taking a fluid pill? yes  4) Are you currently SOB? She is not SOB per say, but it is different than normal  5) Do you have a log of your daily weights (if so, list)?   12-31: 127.8  12-30: 127.6  12-29: 126.8  12-28: 126.2  12-27: 127.2  12-26: 127.0  12-25: 127.6  12-24: 128  6) Have you gained 3 pounds in a day or 5 pounds in a week? No  7) Have you traveled recently? No  Patient called and says her legs are still swelling and they have become painful. She called last week and was told to adjust her Lasix then go back down to normal dose. She has followed those instructions. She took two lasix yesterday and  has already taken one lasix today.  She is concerned about the swelling and the pain in her legs. She does have neuropathy as well.

## 2020-01-01 NOTE — Telephone Encounter (Signed)
Pt should have BMET and BNP early in week.  May end up on 40 alternating with 20 mg daily  Need labs

## 2020-01-02 NOTE — Telephone Encounter (Signed)
I spoke to the patient who will try to come by Thursday 1/7 for labs (BNP/BMET).  Her swelling has improved somewhat, but still there.

## 2020-01-05 ENCOUNTER — Other Ambulatory Visit: Payer: Medicare Other

## 2020-01-09 ENCOUNTER — Ambulatory Visit: Payer: Medicare Other | Attending: Internal Medicine

## 2020-01-09 DIAGNOSIS — Z20822 Contact with and (suspected) exposure to covid-19: Secondary | ICD-10-CM

## 2020-01-10 LAB — NOVEL CORONAVIRUS, NAA: SARS-CoV-2, NAA: NOT DETECTED

## 2020-01-12 ENCOUNTER — Telehealth: Payer: Self-pay

## 2020-01-12 NOTE — Telephone Encounter (Signed)
Pt notified of negative COVID-19 results. Understanding verbalized.  [place]

## 2020-01-16 ENCOUNTER — Telehealth: Payer: Self-pay | Admitting: Internal Medicine

## 2020-01-16 NOTE — Telephone Encounter (Signed)
Patient reporting that her wieght is 130 (up 2/3 pounds above normal).  She gets SOB with activities around the house.  Feels heart pounding. Was due for labs but missed appt due to possible covid exposure. She will come Wednesday for BMET, BNP. Continues lasix 20 mg alt with 40 mg every other day. I adv to take 40 mg tomorrow and Wed.  Took 40 today.  Limiting salt Elevating legs when resting. Will record BP/HR and weight and bring list with her to drop off on Wed and await results/recommendations from Dr. Harrington Challenger.

## 2020-01-16 NOTE — Telephone Encounter (Signed)
Agree with recommendations.  

## 2020-01-16 NOTE — Telephone Encounter (Signed)
New message  Pt c/o swelling: STAT is pt has developed SOB within 24 hours  1) How much weight have you gained and in what time span? Does not know   2) If swelling, where is the swelling located?knees and all the way down to her feet   3) Are you currently taking a fluid pill? Yes   4) Are you currently SOB? Does not know   5) Do you have a log of your daily weights (if so, list)? No   6) Have you gained 3 pounds in a day or 5 pounds in a week? Does not know   7) Have you traveled recently? No

## 2020-01-18 ENCOUNTER — Other Ambulatory Visit: Payer: Self-pay

## 2020-01-18 ENCOUNTER — Other Ambulatory Visit: Payer: Medicare Other | Admitting: *Deleted

## 2020-01-18 DIAGNOSIS — Z79899 Other long term (current) drug therapy: Secondary | ICD-10-CM

## 2020-01-18 DIAGNOSIS — R609 Edema, unspecified: Secondary | ICD-10-CM

## 2020-01-19 LAB — BASIC METABOLIC PANEL
BUN/Creatinine Ratio: 24 (ref 12–28)
BUN: 23 mg/dL (ref 8–27)
CO2: 24 mmol/L (ref 20–29)
Calcium: 9 mg/dL (ref 8.7–10.3)
Chloride: 103 mmol/L (ref 96–106)
Creatinine, Ser: 0.96 mg/dL (ref 0.57–1.00)
GFR calc Af Amer: 62 mL/min/{1.73_m2} (ref >59)
GFR calc non Af Amer: 54 mL/min/{1.73_m2} — ABNORMAL LOW (ref >59)
Glucose: 80 mg/dL (ref 65–99)
Potassium: 4.3 mmol/L (ref 3.5–5.2)
Sodium: 141 mmol/L (ref 134–144)

## 2020-01-19 LAB — PRO B NATRIURETIC PEPTIDE: NT-Pro BNP: 2725 pg/mL — ABNORMAL HIGH (ref 0–738)

## 2020-01-24 ENCOUNTER — Telehealth: Payer: Self-pay | Admitting: *Deleted

## 2020-01-24 DIAGNOSIS — I10 Essential (primary) hypertension: Secondary | ICD-10-CM

## 2020-01-24 DIAGNOSIS — R0600 Dyspnea, unspecified: Secondary | ICD-10-CM

## 2020-01-24 DIAGNOSIS — R0609 Other forms of dyspnea: Secondary | ICD-10-CM

## 2020-01-24 DIAGNOSIS — Z79899 Other long term (current) drug therapy: Secondary | ICD-10-CM

## 2020-01-24 MED ORDER — POTASSIUM CHLORIDE ER 10 MEQ PO TBCR: 10.0000 meq | EXTENDED_RELEASE_TABLET | Freq: Two times a day (BID) | ORAL | 3 refills | Status: DC

## 2020-01-24 MED ORDER — FUROSEMIDE 20 MG PO TABS: 40.0000 mg | ORAL_TABLET | Freq: Every day | ORAL | 3 refills | Status: DC

## 2020-01-24 NOTE — Telephone Encounter (Signed)
-----  Message from [name] V, MD sent at 01/22/2020  5:15 PM EST ----- Electrolytesa and kidney function are normal FLuid is still up I would recomm increasing lasix to 40 mg daily   Double up on K[facility] supplemtns BMET and BNP in 2 weeks

## 2020-01-25 ENCOUNTER — Other Ambulatory Visit: Payer: Self-pay | Admitting: *Deleted

## 2020-01-30 ENCOUNTER — Ambulatory Visit (INDEPENDENT_AMBULATORY_CARE_PROVIDER_SITE_OTHER): Payer: Medicare Other | Admitting: Internal Medicine

## 2020-01-30 ENCOUNTER — Telehealth: Payer: Self-pay | Admitting: Internal Medicine

## 2020-01-30 ENCOUNTER — Other Ambulatory Visit: Payer: Self-pay

## 2020-01-30 ENCOUNTER — Inpatient Hospital Stay (HOSPITAL_COMMUNITY)
Admission: AD | Admit: 2020-01-30 | Discharge: 2020-02-02 | DRG: 292 | Disposition: A | Discharge status: Home Health | Payer: Medicare Other | Source: Ambulatory Visit | Attending: Cardiology | Admitting: Cardiology

## 2020-01-30 VITALS — BP 110/66 | HR 101 | Ht 60.0 in | Wt 137.2 lb

## 2020-01-30 DIAGNOSIS — I5033 Acute on chronic diastolic (congestive) heart failure: Secondary | ICD-10-CM | POA: Diagnosis present

## 2020-01-30 DIAGNOSIS — R609 Edema, unspecified: Secondary | ICD-10-CM

## 2020-01-30 DIAGNOSIS — I509 Heart failure, unspecified: Secondary | ICD-10-CM | POA: Diagnosis not present

## 2020-01-30 DIAGNOSIS — E785 Hyperlipidemia, unspecified: Secondary | ICD-10-CM | POA: Diagnosis present

## 2020-01-30 DIAGNOSIS — Z20822 Contact with and (suspected) exposure to covid-19: Secondary | ICD-10-CM | POA: Diagnosis present

## 2020-01-30 DIAGNOSIS — Z7901 Long term (current) use of anticoagulants: Secondary | ICD-10-CM

## 2020-01-30 DIAGNOSIS — I5032 Chronic diastolic (congestive) heart failure: Secondary | ICD-10-CM

## 2020-01-30 DIAGNOSIS — I4821 Permanent atrial fibrillation: Secondary | ICD-10-CM | POA: Diagnosis present

## 2020-01-30 DIAGNOSIS — I482 Chronic atrial fibrillation, unspecified: Secondary | ICD-10-CM | POA: Diagnosis not present

## 2020-01-30 DIAGNOSIS — I110 Hypertensive heart disease with heart failure: Principal | ICD-10-CM | POA: Diagnosis present

## 2020-01-30 DIAGNOSIS — Z7951 Long term (current) use of inhaled steroids: Secondary | ICD-10-CM

## 2020-01-30 DIAGNOSIS — F0390 Unspecified dementia without behavioral disturbance: Secondary | ICD-10-CM | POA: Diagnosis present

## 2020-01-30 DIAGNOSIS — R252 Cramp and spasm: Secondary | ICD-10-CM | POA: Diagnosis present

## 2020-01-30 DIAGNOSIS — Z90710 Acquired absence of both cervix and uterus: Secondary | ICD-10-CM

## 2020-01-30 DIAGNOSIS — M810 Age-related osteoporosis without current pathological fracture: Secondary | ICD-10-CM | POA: Diagnosis present

## 2020-01-30 DIAGNOSIS — Z79899 Other long term (current) drug therapy: Secondary | ICD-10-CM

## 2020-01-30 LAB — CBC WITH DIFFERENTIAL/PLATELET
Abs Immature Granulocytes: 0.03 10*3/uL (ref 0.00–0.07)
Basophils Absolute: 0.1 10*3/uL (ref 0.0–0.1)
Basophils Relative: 1 %
Eosinophils Absolute: 0.2 10*3/uL (ref 0.0–0.5)
Eosinophils Relative: 2 %
HCT: 43 % (ref 36.0–46.0)
Hemoglobin: 13.2 g/dL (ref 12.0–15.0)
Immature Granulocytes: 0 %
Lymphocytes Relative: 12 %
Lymphs Abs: 0.9 10*3/uL (ref 0.7–4.0)
MCH: 29.2 pg (ref 26.0–34.0)
MCHC: 30.7 g/dL (ref 30.0–36.0)
MCV: 95.1 fL (ref 80.0–100.0)
Monocytes Absolute: 0.6 10*3/uL (ref 0.1–1.0)
Monocytes Relative: 9 %
Neutro Abs: 5.6 10*3/uL (ref 1.7–7.7)
Neutrophils Relative %: 76 %
Platelets: UNDETERMINED 10*3/uL (ref 150–400)
RBC: 4.52 MIL/uL (ref 3.87–5.11)
RDW: 14.6 % (ref 11.5–15.5)
WBC: 7.4 10*3/uL (ref 4.0–10.5)
nRBC: 0 % (ref 0.0–0.2)

## 2020-01-30 LAB — COMPREHENSIVE METABOLIC PANEL
ALT: 25 U/L (ref 0–44)
AST: 40 U/L (ref 15–41)
Albumin: 3.9 g/dL (ref 3.5–5.0)
Alkaline Phosphatase: 107 U/L (ref 38–126)
Anion gap: 11 (ref 5–15)
BUN: 21 mg/dL (ref 8–23)
CO2: 26 mmol/L (ref 22–32)
Calcium: 9.6 mg/dL (ref 8.9–10.3)
Chloride: 103 mmol/L (ref 98–111)
Creatinine, Ser: 1.03 mg/dL — ABNORMAL HIGH (ref 0.44–1.00)
GFR calc Af Amer: 57 mL/min — ABNORMAL LOW (ref >60)
GFR calc non Af Amer: 50 mL/min — ABNORMAL LOW (ref >60)
Glucose, Bld: 100 mg/dL — ABNORMAL HIGH (ref 70–99)
Potassium: 4.2 mmol/L (ref 3.5–5.1)
Sodium: 140 mmol/L (ref 135–145)
Total Bilirubin: 1.5 mg/dL — ABNORMAL HIGH (ref 0.3–1.2)
Total Protein: 7 g/dL (ref 6.5–8.1)

## 2020-01-30 LAB — BRAIN NATRIURETIC PEPTIDE: B Natriuretic Peptide: 617.8 pg/mL — ABNORMAL HIGH (ref 0.0–100.0)

## 2020-01-30 LAB — TSH: TSH: 4.314 u[IU]/mL (ref 0.350–4.500)

## 2020-01-30 MED ORDER — APIXABAN 2.5 MG PO TABS
2.5000 mg | ORAL_TABLET | Freq: Two times a day (BID) | ORAL | Status: DC
  Administered 2020-01-30 – 2020-02-02 (×6): 2.5 mg via ORAL
  Filled 2020-01-30 (×6): qty 1

## 2020-01-30 MED ORDER — SODIUM CHLORIDE 0.9% FLUSH
3.0000 mL | Freq: Two times a day (BID) | INTRAVENOUS | Status: DC
  Administered 2020-01-30 – 2020-02-02 (×7): 3 mL via INTRAVENOUS

## 2020-01-30 MED ORDER — ONDANSETRON HCL 4 MG/2ML IJ SOLN: 4.0000 mg | Freq: Four times a day (QID) | INTRAMUSCULAR | Status: DC | PRN

## 2020-01-30 MED ORDER — GABAPENTIN 300 MG PO CAPS
300.0000 mg | ORAL_CAPSULE | Freq: Three times a day (TID) | ORAL | Status: DC
  Administered 2020-01-30 – 2020-02-02 (×8): 300 mg via ORAL
  Filled 2020-01-30 (×8): qty 1

## 2020-01-30 MED ORDER — SIMVASTATIN 20 MG PO TABS
10.0000 mg | ORAL_TABLET | Freq: Every day | ORAL | Status: DC
  Administered 2020-01-30 – 2020-02-02 (×4): 10 mg via ORAL
  Filled 2020-01-30 (×4): qty 1

## 2020-01-30 MED ORDER — DONEPEZIL HCL 5 MG PO TABS: 5.0000 mg | ORAL_TABLET | Freq: Every day | ORAL | Status: DC

## 2020-01-30 MED ORDER — SODIUM CHLORIDE 0.9% FLUSH: 3.0000 mL | INTRAVENOUS | Status: DC | PRN

## 2020-01-30 MED ORDER — ESTRADIOL 0.5 MG PO TABS: 0.2500 mg | ORAL_TABLET | ORAL | Status: DC

## 2020-01-30 MED ORDER — ACETAMINOPHEN 500 MG PO TABS: 500.0000 mg | ORAL_TABLET | Freq: Four times a day (QID) | ORAL | Status: DC | PRN

## 2020-01-30 MED ORDER — CLOBETASOL PROPIONATE 0.05 % EX SOLN: Freq: Two times a day (BID) | CUTANEOUS | Status: DC

## 2020-01-30 MED ORDER — FUROSEMIDE 10 MG/ML IJ SOLN
80.0000 mg | Freq: Two times a day (BID) | INTRAMUSCULAR | Status: DC
  Administered 2020-01-30 – 2020-02-02 (×5): 80 mg via INTRAVENOUS
  Filled 2020-01-30 (×5): qty 8

## 2020-01-30 MED ORDER — ATENOLOL 25 MG PO TABS
25.0000 mg | ORAL_TABLET | Freq: Every day | ORAL | Status: DC
  Administered 2020-01-31 – 2020-02-01 (×2): 25 mg via ORAL
  Filled 2020-01-30 (×3): qty 1

## 2020-01-30 MED ORDER — ACETAMINOPHEN 325 MG PO TABS
650.0000 mg | ORAL_TABLET | ORAL | Status: DC | PRN
  Administered 2020-01-31 – 2020-02-01 (×3): 650 mg via ORAL
  Filled 2020-01-30 (×3): qty 2

## 2020-01-30 MED ORDER — FUROSEMIDE 10 MG/ML IJ SOLN: 40.0000 mg | Freq: Two times a day (BID) | INTRAMUSCULAR | Status: DC

## 2020-01-30 MED ORDER — DONEPEZIL HCL 10 MG PO TABS
10.0000 mg | ORAL_TABLET | Freq: Every day | ORAL | Status: DC
  Administered 2020-01-30 – 2020-02-01 (×3): 10 mg via ORAL
  Filled 2020-01-30 (×3): qty 1

## 2020-01-30 MED ORDER — IPRATROPIUM BROMIDE 0.06 % NA SOLN
2.0000 | Freq: Four times a day (QID) | NASAL | Status: DC | PRN
  Filled 2020-01-30: qty 15

## 2020-01-30 MED ORDER — ALENDRONATE SODIUM 70 MG PO TABS: 70.0000 mg | ORAL_TABLET | ORAL | Status: DC

## 2020-01-30 MED ORDER — GABAPENTIN 400 MG PO CAPS: 400.0000 mg | ORAL_CAPSULE | Freq: Every day | ORAL | Status: DC

## 2020-01-30 MED ORDER — VITAMIN D 25 MCG (1000 UNIT) PO TABS
2000.0000 [IU] | ORAL_TABLET | Freq: Every day | ORAL | Status: DC
  Administered 2020-01-30 – 2020-02-02 (×4): 2000 [IU] via ORAL
  Filled 2020-01-30 (×4): qty 2

## 2020-01-30 MED ORDER — SODIUM CHLORIDE 0.9 % IV SOLN: 250.0000 mL | INTRAVENOUS | Status: DC | PRN

## 2020-01-30 MED ORDER — POTASSIUM CHLORIDE CRYS ER 20 MEQ PO TBCR
20.0000 meq | EXTENDED_RELEASE_TABLET | Freq: Two times a day (BID) | ORAL | Status: DC
  Administered 2020-01-30 – 2020-02-02 (×6): 20 meq via ORAL
  Filled 2020-01-30 (×6): qty 1

## 2020-01-30 MED ORDER — ATENOLOL 50 MG PO TABS
50.0000 mg | ORAL_TABLET | Freq: Every day | ORAL | Status: DC
  Administered 2020-01-31 – 2020-02-02 (×3): 50 mg via ORAL
  Filled 2020-01-30 (×4): qty 1

## 2020-01-30 NOTE — Telephone Encounter (Signed)
New Message  Pt c/o swelling: STAT is pt has developed SOB within 24 hours  1) How much weight have you gained and in what time span? 1 lb   2) If swelling, where is the swelling located? Feet, ankles, legs, knees  3) Are you currently taking a fluid pill? furosemide (LASIX) 20 MG tablet; potassium chloride (KLOR-CON) 10 MEQ tablet; been taking double  4) Are you currently SOB? Yes   5) Do you have a log of your daily weights (if so, list)? Yes  6) Have you gained 3 pounds in a day or 5 pounds in a week? No  7) Have you traveled recently? No

## 2020-01-30 NOTE — Telephone Encounter (Signed)
Spoke with pt a couple of times re message Per pt recent increase in Furosemide has not seemed to help with symptoms Per pt finally was able to rest early this am in recliner was unable to sleep in bed due to SOB  Also pt notes wheezing and legs are tight Discussed with Dr Harrington Challenger Per Dr Harrington Challenger pt needs an appt. Appt made with Dr Harrington Challenger after much convincing for this afternoon at 1:40 pm.[name] husband is being discharged to from hospital and pending on release may affect pt coming in for appt Pt will keep [place] posted .Adonis Housekeeper

## 2020-01-30 NOTE — Patient Instructions (Addendum)
Dr. Harrington Challenger recommends that you go to the hospital today for fluid overload.  They are expecting you at [facility].  Please arrive to admitting to be registered and admitted to your room.

## 2020-01-30 NOTE — Progress Notes (Signed)
\  01/30/2020 [name] Dyer   [birth year]  [id number]   See Note section for full H and P.  Pt admitted to [facility] from clinic   [facility]

## 2020-01-30 NOTE — H&P (Signed)
Cardiology Admission History and Physical:   Patient ID: [name] Dyer MRN: [id number]; DOB: December 09, 1932   Admission date: (Not on file)  Primary Care Provider: [name], MD Primary Cardiologist: [name], MD   Chief Complaint: Patient being admitted for complains of SOB, CHF    Patient Profile:   [name] Dyer is a 86 y.o. female with with a history of permanent atrial fibrillaiotn, diastolic CHF   History of Present Illness:   [name] Dyer is a 86 year old woman with a history of diastolic CHF, hypertension, permanent atrial fibrillation and dementia.  I follow her in cardiology clinic.  Patient had a Myoview in September 2019 that was low risk.  Holter monitor at the time showed atrial fibrillation that was rate controlled.  I saw the patient back in October in clinic her volume status was not bad at that time, up mildly.  I recommended doubling up on the Lasix every other day (40/20) patient wrote back that this was good but then it recurred in November recommended advancing for 3 days and then backing down to the same 4020 in December on Christmas Eve she called back again complaining of edema no shortness of breath no weight gain again she had tried the 40 for 3 days and they seem to improve was told to start 20 again again it was recommended she increase her Lasix.  She called back on 118 weight was up 2 to 3 pounds above normal and she complained of shortness of breath and feeling her heart pound labs on 120 her BNP was 2725.  Lasix was increased to 40 mg daily.  Repeat labs were recommended but she did not come back.  The patient called in today complaining of continued shortness of breath and edema.  She was added into the clinic schedule to come in.  The patient says she is short of breath with minimal activity feels foggy when she does things very weak.  Last night was not able to sleep tossed and turned.  Again swelling of not gone down.  Patient lives with her  husband who is just getting back from the hospital today.  She they have a caregiver for 6 hours a day for him.  Sons live nearby (2)  Heart Pathway Score:     Past Medical History:  Diagnosis Date  . Anemia   . Arrhythmia   . Cataract   . Colon polyps   . Community acquired pneumonia Feb 2013   LLL  . Congestive heart failure ([place])   . Hyperlipidemia   . Hypertension   . Memory difficulty 10/01/2016  . Osteoporosis   . Pleural effusion, right Feb 2013  . SVT (supraventricular tachycardia) ([place]) feb 2013  . UTI (lower urinary tract infection)     Past Surgical History:  Procedure Laterality Date  . ABDOMINAL HYSTERECTOMY  1983  . APPENDECTOMY  1983  . COLONOSCOPY    . COLONOSCOPY N/A 06/10/2016   Procedure: COLONOSCOPY;  Surgeon: Manus Gunning, MD;  Location: [facility];  Service: Gastroenterology;  Laterality: N/A;  . ESOPHAGOGASTRODUODENOSCOPY N/A 06/10/2016   Procedure: ESOPHAGOGASTRODUODENOSCOPY (EGD);  Surgeon: Manus Gunning, MD;  Location: [facility];  Service: Gastroenterology;  Laterality: N/A;  . HEMIARTHROPLASTY HIP  2011   Left femoral neck fracture  . UPPER GASTROINTESTINAL ENDOSCOPY       Medications Prior to Admission: Prior to Admission medications   Medication Sig Start Date End Date Taking? Authorizing Provider  acetaminophen (TYLENOL) 500 MG tablet Take  500 mg by mouth every 6 (six) hours as needed.    [provider]  alendronate (FOSAMAX) 70 MG tablet Take 70 mg by mouth once a week.  09/30/16   [provider]  atenolol (TENORMIN) 25 MG tablet Take 1 tablet in the evening 08/11/19   [name], MD  atenolol (TENORMIN) 50 MG tablet Take 1 tablet every morning 08/11/19   [name], MD  Calcium Carb-Cholecalciferol (CALCIUM + D3 PO) Take by mouth.    [provider]  cholecalciferol (VITAMIN D3) 25 MCG (1000 UT) tablet Take 2,000 Units by mouth daily.    [provider]  clobetasol (TEMOVATE) 0.05  % external solution APPLY SOLUTION TOPICALLY TO AFFECTED AREA UP TO TWICE DAILY AS NEEDED. DO NOT APPLY TO FACE GROIN OR AXILLA. 08/01/19   [provider]  Cyanocobalamin (VITAMIN B-12) 5000 MCG TBDP Take by mouth.    [provider]  donepezil (ARICEPT) 5 MG tablet TAKE 1 TABLET AT BEDTIME 08/08/19   [name], MD  ELIQUIS 2.5 MG TABS tablet TAKE 1 TABLET TWICE A DAY 11/16/19   [name], MD  estradiol (ESTRACE) 0.5 MG tablet Take 0.5 tablets by mouth every other day. 02/26/18   [provider]  Fluocinolone Acetonide Scalp 0.01 % OIL Apply to scalp and neck after shower/bath 1-7 times a week. 10/26/19   Kozlow, Donnamarie Poag, MD  furosemide (LASIX) 20 MG tablet Take 2 tablets (40 mg total) by mouth daily. 01/24/20 04/23/20  [name], MD  gabapentin (NEURONTIN) 100 MG capsule Take 400 mg by mouth daily.    [provider]  halobetasol (ULTRAVATE) 0.05 % cream Can apply thin layer to red scaly areas as directed up to three times a week as needed. 12/28/19   Kozlow, Donnamarie Poag, MD  ipratropium (ATROVENT) 0.06 % nasal spray Can use two sprays in each nostril every six hours if needed to dry up runny nose. 10/18/19   Kennith Gain, MD  Melatonin 10 MG CAPS Take by mouth.    [provider]  mupirocin ointment (BACTROBAN) 2 % APPLY OINTMENT TOPICALLY TO AFFECTED AREA THREE TIMES DAILY 12/08/19   [provider]  potassium chloride (KLOR-CON) 10 MEQ tablet Take 1 tablet (10 mEq total) by mouth 2 (two) times daily. 01/24/20 04/23/20  [name], MD  simvastatin (ZOCOR) 10 MG tablet Take 1 tablet by mouth daily. 02/15/18   [provider]     Allergies:    Allergies  Allergen Reactions  . Ciprofloxacin Diarrhea  . Sulfa Antibiotics Hives and Rash    Severe Rash  . Succinylsulphathiazole   . Sulfamethoxazole Rash    Social History:   Social History   Socioeconomic History  . Marital status: Single    Spouse name: Not on  file  . Number of children: 2  . Years of education: 26  . Highest education level: Not on file  Occupational History  . Occupation: Retired  Tobacco Use  . Smoking status: Never Smoker  . Smokeless tobacco: Never Used  Substance and Sexual Activity  . Alcohol use: No  . Drug use: No  . Sexual activity: Not on file  Other Topics Concern  . Not on file  Social History Narrative   Married, retired, 2 sons. Husband is disabled, non-ambulatory. Spends the the winters in [place].   Right-handed   Caffeine: Cheerwine or hot chocolate occasionally but otherwise none   Social Determinants of Health  Financial Resource Strain:   . Difficulty of Paying Living Expenses: Not on file  Food Insecurity:   . Worried About Charity fundraiser in the Last Year: Not on file  . Ran Out of Food in the Last Year: Not on file  Transportation Needs:   . Lack of Transportation (Medical): Not on file  . Lack of Transportation (Non-Medical): Not on file  Physical Activity:   . Days of Exercise per Week: Not on file  . Minutes of Exercise per Session: Not on file  Stress:   . Feeling of Stress : Not on file  Social Connections:   . Frequency of Communication with Friends and Family: Not on file  . Frequency of Social Gatherings with Friends and Family: Not on file  . Attends Religious Services: Not on file  . Active Member of Clubs or Organizations: Not on file  . Attends Archivist Meetings: Not on file  . Marital Status: Not on file  Intimate Partner Violence:   . Fear of Current or Ex-Partner: Not on file  . Emotionally Abused: Not on file  . Physically Abused: Not on file  . Sexually Abused: Not on file    Family History:   The patient's family history includes Cancer in her grandchild; Colon polyps in her son; Stroke in her father.    ROS:  Please see the history of present illness.  All other ROS reviewed and negative.     Physical Exam/Data:  There were no vitals filed  for this visit. No intake or output data in the 24 hours ending 01/30/20 1434 Last 3 Weights 01/30/2020 09/29/2019 09/14/2019  Weight (lbs) 137 lb 3.2 oz 125 lb 9.6 oz 128 lb  Weight (kg) 62.234 kg 56.972 kg 58.06 kg     There is no height or weight on file to calculate BMI.  General: Frail appearing 86 year old who is dyspneic with minimal exertion. HEENT: normal Lymph: no adenopathy Neck: JVP is elevated to below the jaw sitting.  No bruits Cardiac: Irregularly irregular.  Tachycardia.  S1, S2.  Grade 2/6 systolic murmur sternal border. Lungs:  clear to auscultation bilaterally, no wheezing, rhonchi or rales  Abd: soft, nontender, no hepatomegaly  Ext: 1-2+ lower extremity edema.  Legs tender Musculoskeletal:  No deformities, BUE and BLE strength normal and equal Skin: warm and dry  Neuro:  CNs 2-12 intact, no focal abnormalities noted Psych:  Normal affect    EKG:  The ECG that was done today was personally reviewed and demonstrates atrial fibrillation.  86 bpm.  Nonspecific ST-T wave changes  Relevant CV Studies:  ECHO 06/09/18  -------------------------------------------------------------------  Study Conclusions   - Left ventricle: The cavity size was normal. Systolic function was  normal. The estimated ejection fraction was in the range of 55%  to 60%. Wall motion was normal; there were no regional wall  motion abnormalities. Doppler parameters are consistent with  restrictive physiology, indicative of decreased left ventricular  diastolic compliance and/or increased left atrial pressure.  Doppler parameters are consistent with elevated ventricular  end-diastolic filling pressure.  - Aortic valve: There was trivial regurgitation.  - Mitral valve: Calcified annulus. Mildly thickened leaflets .  There was moderate regurgitation.  - Left atrium: The atrium was mildly dilated.  - Right ventricle: The cavity size was moderately dilated. Wall  thickness was  normal. Systolic function was moderately reduced.  - Right atrium: The atrium was moderately dilated.  - Tricuspid valve: There was moderate regurgitation.  -  Pulmonic valve: There was mild regurgitation.  - Pulmonary arteries: Systolic pressure was moderately increased.  PA peak pressure: 44 mm Hg (S).  - Inferior vena cava: The vessel was normal in size. The  respirophasic diameter changes were in the normal range (= 50%),  consistent with normal central venous pressure.  - Pericardium, extracardiac: A trivial pericardial effusion was  identified posterior to the heart. Features were not consistent  with tamponade physiology.  Laboratory Data:  High Sensitivity Troponin:  No results for input(s): TROPONINIHS in the last 720 hours.    ChemistryNo results for input(s): NA, K, CL, CO2, GLUCOSE, BUN, CREATININE, CALCIUM, GFRNONAA, GFRAA, ANIONGAP in the last 168 hours.  No results for input(s): PROT, ALBUMIN, AST, ALT, ALKPHOS, BILITOT in the last 168 hours. HematologyNo results for input(s): WBC, RBC, HGB, HCT, MCV, MCH, MCHC, RDW, PLT in the last 168 hours. BNPNo results for input(s): BNP, PROBNP in the last 168 hours.  DDimer No results for input(s): DDIMER in the last 168 hours.   Radiology/Studies:  No results found. {  Assessment and Plan:   1. Acute on chronic CHF.  The patient comes in today with complaints of edema and shortness of breath.  Indeed she is called in erratically over the past few months with edema and occasional shortness of breath but things have seemed of gotten worse.  I am not sure if they ever were fully normal at home during that time period of October to now.  Still today she appears quite uncomfortable.  She is very winded just getting from the chair to the exam table.  I am worried she will fall she is spending quite a bit of energy.  On examination today she has definite volume overload.  I would recommend admitting for a course of IV Lasix.  I  think this would be the safest and most expeditious way to get her volume under control.  Will be on telemetry at that time and be able to follow renal function.Consider repeat echo when she is fully diuresed to reevaluate LV function 2. Chronic atrial fibrillation.  Patient has permanent atrial fibrillation her rates were controlled on monitor but they may be elevated now she did go fast just again moving from the chair to the table but quickly came down.  We will continue to follow on telemetry.  Would follow on anticoagulation as well. 3.  Disposition.  The patient lives at home now with her husband.  He is just getting back from the hospital.  Her sons live about 0.5 miles away the 1 son I spoke with says his mother works too hard she is on her feet 7 hours at a time.  Will rest.  They do have a care person for the husband is that she is there 6 hours/day during the day.  Question if they need more.  Would like to have social work evaluate.    For questions or updates, please contact [place] Please consult [email or web address] for contact info under        Signed, [name], MD  01/30/2020 2:34 PM

## 2020-01-31 ENCOUNTER — Observation Stay (HOSPITAL_BASED_OUTPATIENT_CLINIC_OR_DEPARTMENT_OTHER): Payer: Medicare Other

## 2020-01-31 DIAGNOSIS — R252 Cramp and spasm: Secondary | ICD-10-CM | POA: Diagnosis present

## 2020-01-31 DIAGNOSIS — I341 Nonrheumatic mitral (valve) prolapse: Secondary | ICD-10-CM

## 2020-01-31 DIAGNOSIS — Z90710 Acquired absence of both cervix and uterus: Secondary | ICD-10-CM | POA: Diagnosis not present

## 2020-01-31 DIAGNOSIS — F0390 Unspecified dementia without behavioral disturbance: Secondary | ICD-10-CM | POA: Diagnosis present

## 2020-01-31 DIAGNOSIS — I5033 Acute on chronic diastolic (congestive) heart failure: Secondary | ICD-10-CM | POA: Diagnosis present

## 2020-01-31 DIAGNOSIS — M810 Age-related osteoporosis without current pathological fracture: Secondary | ICD-10-CM | POA: Diagnosis present

## 2020-01-31 DIAGNOSIS — Z7901 Long term (current) use of anticoagulants: Secondary | ICD-10-CM | POA: Diagnosis not present

## 2020-01-31 DIAGNOSIS — I4821 Permanent atrial fibrillation: Secondary | ICD-10-CM

## 2020-01-31 DIAGNOSIS — E785 Hyperlipidemia, unspecified: Secondary | ICD-10-CM | POA: Diagnosis present

## 2020-01-31 DIAGNOSIS — Z7951 Long term (current) use of inhaled steroids: Secondary | ICD-10-CM | POA: Diagnosis not present

## 2020-01-31 DIAGNOSIS — I340 Nonrheumatic mitral (valve) insufficiency: Secondary | ICD-10-CM

## 2020-01-31 DIAGNOSIS — Z20822 Contact with and (suspected) exposure to covid-19: Secondary | ICD-10-CM | POA: Diagnosis present

## 2020-01-31 DIAGNOSIS — I110 Hypertensive heart disease with heart failure: Secondary | ICD-10-CM | POA: Diagnosis present

## 2020-01-31 DIAGNOSIS — Z79899 Other long term (current) drug therapy: Secondary | ICD-10-CM | POA: Diagnosis not present

## 2020-01-31 LAB — BASIC METABOLIC PANEL
Anion gap: 13 (ref 5–15)
BUN: 20 mg/dL (ref 8–23)
CO2: 28 mmol/L (ref 22–32)
Calcium: 9.1 mg/dL (ref 8.9–10.3)
Chloride: 102 mmol/L (ref 98–111)
Creatinine, Ser: 0.96 mg/dL (ref 0.44–1.00)
GFR calc Af Amer: >60 mL/min (ref >60)
GFR calc non Af Amer: 54 mL/min — ABNORMAL LOW (ref >60)
Glucose, Bld: 94 mg/dL (ref 70–99)
Potassium: 3.6 mmol/L (ref 3.5–5.1)
Sodium: 143 mmol/L (ref 135–145)

## 2020-01-31 LAB — ECHOCARDIOGRAM COMPLETE
Height: 65 in
Weight: 2054.4 oz

## 2020-01-31 LAB — SARS CORONAVIRUS 2 (TAT 6-24 HRS): SARS Coronavirus 2: NEGATIVE

## 2020-01-31 MED ORDER — MELATONIN 3 MG PO TABS
4.5000 mg | ORAL_TABLET | Freq: Once | ORAL | Status: DC
  Filled 2020-01-31: qty 1.5

## 2020-01-31 MED ORDER — NON FORMULARY: 5.0000 mg | Freq: Once | Status: DC

## 2020-01-31 NOTE — Progress Notes (Signed)
  ReDS Clip Diuretic Study Pt study # [id number]  Your patient has been enrolled in the ReDS Clip Diuretic Study   Changes to prescribed diuretics recommended:  No changes to diuretic at this time Provider contacted: Dr. Harrell Gave Recommendation was accepted by provider.   REDS Clip  READING= 34%  CHEST RULER = 22 cm Clip Station = A  Orthodema score = 1 Signs/Symptoms Score   Mild edema, no orthopnea 0 No congestion  Moderate edema, no orthopnea 1 Low-grade orthodema/congestion  Severe edema OR orthopnea 2   Moderate edema and orthopnea 3 High-grade orthodema/congestion  Severe edema AND orthopnea 4     Vertis Kelch, PharmD, [facility] PGY2 Cardiology Pharmacy Resident Phone [phone] 01/31/2020       9:38 AM  Please check [email or web address] for unit-specific pharmacist phone numbers

## 2020-01-31 NOTE — NC FL2 (Signed)
[place] MEDICAID FL2 LEVEL OF CARE SCREENING TOOL     IDENTIFICATION  Patient Name: [name] Dyer Birthdate: [birth year] Sex: female Admission Date (Current Location): 01/30/2020  [facility] and [place] Number:  Herbalist and Address:  [facility], [place] [address], [place], [place] [address]      Provider Number: [id number]  Attending Physician Name and Address:  Buford Dresser, *  Relative Name and Phone Number:       Current Level of Care: Hospital Recommended Level of Care: [place] Prior Approval Number:    Date Approved/Denied:   PASRR Number: [id number]  Discharge Plan: SNF    Current Diagnoses: Patient Active Problem List   Diagnosis Date Noted  . CHF (congestive heart failure) ([place]) 01/30/2020  . Chronic anticoagulation 06/24/2018  . Peripheral neuropathy 06/24/2018  . Memory difficulty 10/01/2016  . Colitis presumed infectious 06/06/2016  . Chronic diastolic CHF (congestive heart failure) ([place]) 06/06/2016  . Permanent atrial fibrillation ([place]) 06/06/2016  . Hypertension 06/06/2016  . Hyperlipidemia 06/06/2016  . Diarrhea 06/04/2016  . Generalized abdominal pain 06/04/2016  . Anemia, unspecified 04/27/2012    Orientation RESPIRATION BLADDER Height & Weight     Self, Time, Situation, Place  Normal Continent Weight: 128 lb 6.4 oz (58.2 kg)(scale a) Height:  _0  (165.1 cm)  BEHAVIORAL SYMPTOMS/MOOD NEUROLOGICAL BOWEL NUTRITION STATUS      Continent    AMBULATORY STATUS COMMUNICATION OF NEEDS Skin   Limited Assist Verbally Normal                       Personal Care Assistance Level of Assistance  Bathing, Dressing Bathing Assistance: Limited assistance   Dressing Assistance: Limited assistance     Functional Limitations Info  Sight          SPECIAL CARE FACTORS FREQUENCY  PT (By licensed PT), OT (By licensed OT)                    Contractures Contractures Info: Not  present    Additional Factors Info  Code Status Code Status Info: FULL CODE             Current Medications (01/31/2020):  This is the current hospital active medication list Current Facility-Administered Medications  Medication Dose Route Frequency Provider Last Rate Last Admin  . 0.9 %  sodium chloride infusion  250 mL Intravenous PRN [name], MD      . acetaminophen (TYLENOL) tablet 650 mg  650 mg Oral Q4H PRN [name], MD   650 mg at 01/31/20 0033  . apixaban (ELIQUIS) tablet 2.5 mg  2.5 mg Oral BID [name], MD   2.5 mg at 01/31/20 1049  . atenolol (TENORMIN) tablet 25 mg  25 mg Oral QHS [name], MD      . atenolol (TENORMIN) tablet 50 mg  50 mg Oral Daily [name], MD   50 mg at 01/31/20 1051  . cholecalciferol (VITAMIN D3) tablet 2,000 Units  2,000 Units Oral Daily [name], MD   2,000 Units at 01/31/20 1050  . donepezil (ARICEPT) tablet 10 mg  10 mg Oral QHS [name], MD   10 mg at 01/30/20 2252  . furosemide (LASIX) injection 80 mg  80 mg Intravenous Q12H [name], MD   80 mg at 01/31/20 9470  . gabapentin (NEURONTIN) capsule 300 mg  300 mg Oral TID  [name], MD   300 mg at 01/31/20 1049  . ipratropium (ATROVENT) 0.06 % nasal spray 2 spray  2 spray Each Nare Q6H PRN [name], MD      . Melatonin TABS 4.5 mg  4.5 mg Oral Once [name], MD      . ondansetron [facility]) injection 4 mg  4 mg Intravenous Q6H PRN [name], MD      . potassium chloride SA (KLOR-CON) CR tablet 20 mEq  20 mEq Oral BID [name], MD   20 mEq at 01/31/20 1051  . simvastatin (ZOCOR) tablet 10 mg  10 mg Oral Daily [name], MD   10 mg at 01/31/20 1050  . sodium chloride flush (NS) 0.9 % injection 3 mL  3 mL Intravenous Q12H [name], MD   3 mL at 01/31/20 1055  . sodium chloride flush (NS) 0.9 % injection 3 mL  3 mL Intravenous PRN [name], MD         Discharge Medications: Please see discharge summary for a list of discharge  medications.  Relevant Imaging Results:  Relevant Lab Results:   Additional Information SS# [id number]  Amador Cunas, [place]

## 2020-01-31 NOTE — Progress Notes (Signed)
  Echocardiogram 2D Echocardiogram has been performed.  Jennette Dubin 01/31/2020, 10:26 AM

## 2020-01-31 NOTE — TOC Initial Note (Signed)
Transition of Care [facility]) - Initial/Assessment Note    Patient Details  Name: [name] Dyer MRN: [id number] Date of Birth: 11-02-1932  [facility] CM/SW Contact:    Amador Cunas, [place] Phone Number: 01/31/2020, 12:19 PM  Clinical Narrative:  Spoke with pt re [facility] consult for SNF. Pt willing to consider SNF placement for STR, but leaning towards returning home with [facility]. Pt is caregiver for husband who was recently dc from hospital, paid caregiver in place for 6 hours daily and sons assist as needed. No PT/OT evals ordered at this time. SNF search started in event pt becomes agreeable.   Wandra Feinstein, MSW, LCSW [phone] (coverage)                  Expected Discharge Plan: [place] Barriers to Discharge: Continued Medical Work up   Patient Goals and CMS Choice        Expected Discharge Plan and Services Expected Discharge Plan: [place]                                              Prior Living Arrangements/Services   Lives with:: Spouse   Do you feel safe going back to the place where you live?: Yes      Need for Family Participation in Patient Care: Yes (Comment) Care giver support system in place?: Yes (comment) Current home services: [facility] Criminal Activity/Legal Involvement Pertinent to Current Situation/Hospitalization: No - Comment as needed  Activities of Daily Living Home Assistive Devices/Equipment: Blood pressure cuff, Eyeglasses ADL Screening (condition at time of admission) [name] cognitive ability adequate to safely complete daily activities?: Yes Is the patient deaf or have difficulty hearing?: No Does the patient have difficulty seeing, even when wearing glasses/contacts?: Yes Does the patient have difficulty concentrating, remembering, or making decisions?: No Patient able to express need for assistance with ADLs?: Yes Does the patient have difficulty dressing or bathing?:  No Independently performs ADLs?: Yes (appropriate for developmental age) Does the patient have difficulty walking or climbing stairs?: No Weakness of Legs: Both Weakness of Arms/Hands: None  Permission Sought/Granted Permission sought to share information with : Chartered certified accountant granted to share information with : Yes, Verbal Permission Granted              Emotional Assessment Appearance:: Appears older than stated age     Orientation: : Oriented to Self, Oriented to Place, Oriented to  Time, Oriented to Situation Alcohol / Substance Use: Never Used Psych Involvement: No (comment)  Admission diagnosis:  CHF (congestive heart failure) ([place]) [I50.9] Patient Active Problem List   Diagnosis Date Noted  . CHF (congestive heart failure) ([place]) 01/30/2020  . Chronic anticoagulation 06/24/2018  . Peripheral neuropathy 06/24/2018  . Memory difficulty 10/01/2016  . Colitis presumed infectious 06/06/2016  . Chronic diastolic CHF (congestive heart failure) ([place]) 06/06/2016  . Permanent atrial fibrillation ([place]) 06/06/2016  . Hypertension 06/06/2016  . Hyperlipidemia 06/06/2016  . Diarrhea 06/04/2016  . Generalized abdominal pain 06/04/2016  . Anemia, unspecified 04/27/2012   PCP:  [name], MD Pharmacy:   [facility] - [address] [place] [address] [place] [address] Phone: [phone] Fax: [phone]  [id number] [place] Phone: [phone] Fax: [phone]     Social Determinants of Health (SDOH) Interventions    Readmission Risk Interventions No flowsheet data found.

## 2020-01-31 NOTE — Progress Notes (Signed)
Progress Note  Patient Name: [name] Dyer Date of Encounter: 01/31/2020  Primary Cardiologist: [name], MD   Subjective   Had muscle cramping intermittently, still having somewhat but improved. ReDs vest is 34%. She feels better than yesterday, breathing improved. No chest pain.  Inpatient Medications    Scheduled Meds: . apixaban  2.5 mg Oral BID  . atenolol  25 mg Oral QHS  . atenolol  50 mg Oral Daily  . cholecalciferol  2,000 Units Oral Daily  . donepezil  10 mg Oral QHS  . furosemide  80 mg Intravenous Q12H  . gabapentin  300 mg Oral TID  . Melatonin  4.5 mg Oral Once  . potassium chloride  20 mEq Oral BID  . simvastatin  10 mg Oral Daily  . sodium chloride flush  3 mL Intravenous Q12H   Continuous Infusions: . sodium chloride     PRN Meds: sodium chloride, acetaminophen, ipratropium, ondansetron (ZOFRAN) IV, sodium chloride flush   Vital Signs    Vitals:   01/31/20 0454 01/31/20 0504 01/31/20 0735 01/31/20 1137  BP: 125/66  119/76 127/73  Pulse: 68  92 81  Resp: _0 Temp: 97.6 F (36.4 C)  98 F (36.7 C) 98.5 F (36.9 C)  TempSrc: Oral   Oral  SpO2: 90%  92% 94%  Weight:  58.2 kg    Height:        Intake/Output Summary (Last 24 hours) at 01/31/2020 1151 Last data filed at 01/31/2020 1054 Gross per 24 hour  Intake 483 ml  Output 3050 ml  Net -2567 ml   Last 3 Weights 01/31/2020 01/30/2020 01/30/2020  Weight (lbs) 128 lb 6.4 oz 134 lb 4.8 oz 137 lb 3.2 oz  Weight (kg) 58.242 kg 60.918 kg 62.234 kg      Telemetry    Atrial fibrillation - Personally Reviewed  ECG    Atrial fibrillation at 86 bpm - Personally Reviewed  Physical Exam   GEN: No acute distress.  Frail appearing elderly woman. On room air. Neck: JVD to earlobe sitting upright Cardiac: irregularly irregular rhythm, no rubs, or gallops. 2/6 SEM. Respiratory: Clear to auscultation bilaterally except for fine crackles at bilateral bases GI: Soft, nontender, non-distended    MS: Trace edema; No deformity. Neuro:  Nonfocal  Psych: Normal affect   Labs    High Sensitivity Troponin:  No results for input(s): TROPONINIHS in the last 720 hours.    Chemistry Recent Labs  Lab 01/30/20 1555 01/31/20 0358  NA 140 143  K 4.2 3.6  CL 103 102  CO2 26 28  GLUCOSE 100* 94  BUN 21 20  CREATININE 1.03* 0.96  CALCIUM 9.6 9.1  PROT 7.0  --   ALBUMIN 3.9  --   AST 40  --   ALT 25  --   ALKPHOS 107  --   BILITOT 1.5*  --   GFRNONAA 50* 54*  GFRAA 57* >60  ANIONGAP 11 13     Hematology Recent Labs  Lab 01/30/20 1555  WBC 7.4  RBC 4.52  HGB 13.2  HCT 43.0  MCV 95.1  MCH 29.2  MCHC 30.7  RDW 14.6  PLT PLATELET CLUMPS NOTED ON SMEAR, UNABLE TO ESTIMATE    BNP Recent Labs  Lab 01/30/20 1555  BNP 617.8*     DDimer No results for input(s): DDIMER in the last 168 hours.   Radiology    ECHOCARDIOGRAM COMPLETE  Result Date: 01/31/2020   ECHOCARDIOGRAM  REPORT   Patient Name:   [name] Dyer Date of Exam: 01/31/2020 Medical Rec #:  [id number]         Height:       65.0 in Accession #:    [id number]        Weight:       128.4 lb Date of Birth:  30-Jun-1932         BSA:          1.64 m Patient Age:    86 years          BP:           119/76 mmHg Patient Gender: F                 HR:           92 bpm. Exam Location:  Inpatient Procedure: 2D Echo Indications:    CHF- Acute Diastolic Z30.07  History:        Patient has prior history of Echocardiogram examinations, most                 recent 06/09/2018. CHF; Risk Factors:Hypertension and                 Dyslipidemia.  Sonographer:    Mikki Santee RDCS (AE) Referring Phys: [id number] PAULA V ROSS IMPRESSIONS  1. Left ventricular ejection fraction, by visual estimation, is 55 to 60%. The left ventricle has normal function. There is no left ventricular hypertrophy.  2. Left ventricular diastolic function could not be evaluated due to atrial fibrillation.  3. The left ventricle has no regional wall motion abnormalities.   4. Global right ventricle has moderately reduced systolic function.The right ventricular size is mildly enlarged. No increase in right ventricular wall thickness.  5. Left atrial size was normal.  6. Right atrial size was severely dilated.  7. The mitral valve is normal in structure. Mild mitral valve prolapse of the anterior leaflet. There is mild to moderate mitral valve regurgitation eccentrically directed towards the lateral wall. No evidence of mitral stenosis.  8. The tricuspid valve is normal in structure. Tricuspid valve regurgitation is mild.  9. The aortic valve is tricuspid. Aortic valve regurgitation is trivial. No evidence of aortic valve sclerosis or stenosis. 10. The pulmonic valve was normal in structure. Pulmonic valve regurgitation is not visualized. 11. Mildly elevated pulmonary artery systolic pressure. 12. The inferior vena cava is dilated in size with <50% respiratory variability, suggesting right atrial pressure of 15 mmHg. 13. Trivial pericardial effusion is present. 14. The pericardial effusion is posterior to the left ventricle. FINDINGS  Left Ventricle: Left ventricular ejection fraction, by visual estimation, is 55 to 60%. The left ventricle has normal function. The left ventricle has no regional wall motion abnormalities. There is no left ventricular hypertrophy. The left ventricular diastology could not be evaluated due to atrial fibrillation. Left ventricular diastolic function could not be evaluated. Indeterminate filling pressures. Right Ventricle: No increase in right ventricular wall thickness. Global RV systolic function is has moderately reduced systolic function. The tricuspid regurgitant velocity is 2.26 m/s, and with an assumed right atrial pressure of 15 mmHg, the estimated  right ventricular systolic pressure is mildly elevated at 35.5 mmHg. Left Atrium: Left atrial size was normal in size. Right Atrium: Right atrial size was severely dilated Pericardium: Trivial  pericardial effusion is present. The pericardial effusion is posterior to the left ventricle. Mitral Valve: The mitral valve is normal in structure. There is mild  late systolic prolapse of of the mitral valve. Mild to moderate mitral valve regurgitation, with eccentric laterally directed jet. No evidence of mitral valve stenosis by observation. Tricuspid Valve: The tricuspid valve is normal in structure. Tricuspid valve regurgitation is mild. Aortic Valve: The aortic valve is tricuspid. Aortic valve regurgitation is trivial. The aortic valve is structurally normal, with no evidence of sclerosis or stenosis. Pulmonic Valve: The pulmonic valve was normal in structure. Pulmonic valve regurgitation is not visualized. Pulmonic regurgitation is not visualized. Aorta: The aortic root, ascending aorta and aortic arch are all structurally normal, with no evidence of dilitation or obstruction. Venous: The inferior vena cava is dilated in size with less than 50% respiratory variability, suggesting right atrial pressure of 15 mmHg. IAS/Shunts: No atrial level shunt detected by color flow Doppler. There is no evidence of a patent foramen ovale. No ventricular septal defect is seen or detected. There is no evidence of an atrial septal defect.  LEFT VENTRICLE PLAX 2D LVIDd:         4.20 cm  Diastology LVIDs:         2.90 cm  LV e' lateral: 5.68 cm/s LV PW:         0.90 cm  LV e' medial:  4.54 cm/s LV IVS:        0.70 cm LVOT diam:     1.90 cm LV SV:         46 ml LV SV Index:   28.36 LVOT Area:     2.84 cm  RIGHT VENTRICLE RV S prime:     5.76 cm/s TAPSE (M-mode): 1.1 cm LEFT ATRIUM             Index       RIGHT ATRIUM           Index LA diam:        4.20 cm 2.56 cm/m  RA Area:     20.70 cm LA Vol (A2C):   52.2 ml 31.86 ml/m RA Volume:   65.20 ml  39.79 ml/m LA Vol (A4C):   48.0 ml 29.29 ml/m LA Biplane Vol: 53.1 ml 32.41 ml/m   AORTA Ao Root diam: 2.80 cm MR Peak grad: 81.7 mmHg   TRICUSPID VALVE MR Vmax:      452.00 cm/s  TV Peak grad:   31.6 mmHg                           TV Vmax:        2.81 m/s                           TR Peak grad:   20.5 mmHg                           TR Vmax:        251.00 cm/s                            SHUNTS                           Systemic Diam: 1.90 cm  Fransico Him MD Electronically signed by Fransico Him MD Signature Date/Time: 01/31/2020/11:24:29 AM    Final     Cardiac Studies   Echo as above  Patient Profile  86 y.o. female admitted from the office for acute on chronic diastolic heart failure  Assessment & Plan    Acute on chronic diastolic heart failure: -reds vest 34%, but has JVD to earlobe and crackles at bases. No significant LE edema however. -Reports good urine output. Measured as net negative 2.5 L, wt 60.9 kg on admission, today 58.2 -given that she still has JVD and crackles, will continue IV today and tomorrow AM. If doing well at that time, will change to oral. -echo done today with preserved EF (not yet euvolemic) -was on furosemide 40 mg daily at home. May need increased dose at discharge.  Atrial fibrillation, permanent -rate controlled on atenolol -CHA2DS2/VAS Stroke Risk Points= 5 -based on age, weight she is on appropriate dose of apixaban 2.5 mg BID  Muscle cramps: unclear etiology -has been on simvastatin for many years for hyperlipidemia -potassium has ranged 3.6-4.3. Supplementing as needed  Hypertension: well controlled  Disposition:  She reports she ambulates and cares for her husband (just out of the hospital) at home. She has help at home with her husband set up and sons who assist as well. Dr. Harrington Challenger asked that social work see if she qualifies for additional assistance at home.  For questions or updates, please contact [place] Please consult [email or web address] for contact info under     Signed, Buford Dresser, MD  01/31/2020, 11:51 AM

## 2020-02-01 ENCOUNTER — Encounter (HOSPITAL_COMMUNITY): Payer: Self-pay | Admitting: Cardiology

## 2020-02-01 LAB — BASIC METABOLIC PANEL
Anion gap: 16 — ABNORMAL HIGH (ref 5–15)
BUN: 23 mg/dL (ref 8–23)
CO2: 28 mmol/L (ref 22–32)
Calcium: 9.2 mg/dL (ref 8.9–10.3)
Chloride: 100 mmol/L (ref 98–111)
Creatinine, Ser: 1.04 mg/dL — ABNORMAL HIGH (ref 0.44–1.00)
GFR calc Af Amer: 57 mL/min — ABNORMAL LOW (ref >60)
GFR calc non Af Amer: 49 mL/min — ABNORMAL LOW (ref >60)
Glucose, Bld: 91 mg/dL (ref 70–99)
Potassium: 4 mmol/L (ref 3.5–5.1)
Sodium: 144 mmol/L (ref 135–145)

## 2020-02-01 MED ORDER — ZOLPIDEM TARTRATE 5 MG PO TABS
5.0000 mg | ORAL_TABLET | Freq: Once | ORAL | Status: AC
  Administered 2020-02-01: 01:00:00 5 mg via ORAL
  Filled 2020-02-01: qty 1

## 2020-02-01 NOTE — Progress Notes (Signed)
Progress Note  Patient Name: [name] Dyer Date of Encounter: 02/01/2020  Primary Cardiologist: [name], MD   Subjective   Continues to have cramping pains in her legs intermittently. Was able to sleep well last night. Breathing is improved, but still quite dyspneic with minor movements in bed. No chest pain.   Inpatient Medications    Scheduled Meds: . apixaban  2.5 mg Oral BID  . atenolol  25 mg Oral QHS  . atenolol  50 mg Oral Daily  . cholecalciferol  2,000 Units Oral Daily  . donepezil  10 mg Oral QHS  . furosemide  80 mg Intravenous Q12H  . gabapentin  300 mg Oral TID  . Melatonin  4.5 mg Oral Once  . potassium chloride  20 mEq Oral BID  . simvastatin  10 mg Oral Daily  . sodium chloride flush  3 mL Intravenous Q12H   Continuous Infusions: . sodium chloride     PRN Meds: sodium chloride, acetaminophen, ipratropium, ondansetron (ZOFRAN) IV, sodium chloride flush   Vital Signs    Vitals:   01/31/20 1948 02/01/20 0017 02/01/20 0518 02/01/20 0523  BP: (!) 107/56 121/63 125/69   Pulse: 80 96 78   Resp: _0 Temp: 98.1 F (36.7 C) 98 F (36.7 C) 98.5 F (36.9 C)   TempSrc: Oral Oral Oral   SpO2: 95% 93% 91%   Weight:    56.4 kg  Height:        Intake/Output Summary (Last 24 hours) at 02/01/2020 0815 Last data filed at 02/01/2020 0523 Gross per 24 hour  Intake 240 ml  Output 2250 ml  Net -2010 ml   Last 3 Weights 02/01/2020 01/31/2020 01/30/2020  Weight (lbs) 124 lb 4.8 oz 128 lb 6.4 oz 134 lb 4.8 oz  Weight (kg) 56.382 kg 58.242 kg 60.918 kg      Telemetry    Afib, rate controlled - Personally Reviewed  ECG    Afib - Personally Reviewed  Physical Exam   GEN: No acute distress.   Neck: JVD to mandible Cardiac: irregularly irregular, SEM; no rubs or gallops.  Respiratory: Fine crackles at right base, otherwise clear to auscultation bilaterally. GI: Soft, nontender, non-distended  MS: No edema; No deformity. Neuro:  Nonfocal  Psych:  Normal affect   Labs    High Sensitivity Troponin:  No results for input(s): TROPONINIHS in the last 720 hours.    Chemistry Recent Labs  Lab 01/30/20 1555 01/31/20 0358 02/01/20 0532  NA 140 143 144  K 4.2 3.6 4.0  CL 103 102 100  CO2 _1 GLUCOSE 100* 94 91  BUN _2 CREATININE 1.03* 0.96 1.04*  CALCIUM 9.6 9.1 9.2  PROT 7.0  --   --   ALBUMIN 3.9  --   --   AST 40  --   --   ALT 25  --   --   ALKPHOS 107  --   --   BILITOT 1.5*  --   --   GFRNONAA 50* 54* 49*  GFRAA 57* >60 57*  ANIONGAP 11 13 16*     Hematology Recent Labs  Lab 01/30/20 1555  WBC 7.4  RBC 4.52  HGB 13.2  HCT 43.0  MCV 95.1  MCH 29.2  MCHC 30.7  RDW 14.6  PLT PLATELET CLUMPS NOTED ON SMEAR, UNABLE TO ESTIMATE    BNP Recent Labs  Lab 01/30/20 1555  BNP 617.8*  DDimer No results for input(s): DDIMER in the last 168 hours.   Radiology    ECHOCARDIOGRAM COMPLETE  Result Date: 01/31/2020   ECHOCARDIOGRAM REPORT   Patient Name:   [name] Dyer Date of Exam: 01/31/2020 Medical Rec #:  [id number]         Height:       65.0 in Accession #:    [id number]        Weight:       128.4 lb Date of Birth:  1932-02-13         BSA:          1.64 m Patient Age:    86 years          BP:           119/76 mmHg Patient Gender: F                 HR:           92 bpm. Exam Location:  Inpatient Procedure: 2D Echo Indications:    CHF- Acute Diastolic Q22.97  History:        Patient has prior history of Echocardiogram examinations, most                 recent 06/09/2018. CHF; Risk Factors:Hypertension and                 Dyslipidemia.  Sonographer:    Mikki Santee RDCS (AE) Referring Phys: [id number] PAULA V ROSS IMPRESSIONS  1. Left ventricular ejection fraction, by visual estimation, is 55 to 60%. The left ventricle has normal function. There is no left ventricular hypertrophy.  2. Left ventricular diastolic function could not be evaluated due to atrial fibrillation.  3. The left ventricle has no  regional wall motion abnormalities.  4. Global right ventricle has moderately reduced systolic function.The right ventricular size is mildly enlarged. No increase in right ventricular wall thickness.  5. Left atrial size was normal.  6. Right atrial size was severely dilated.  7. The mitral valve is normal in structure. Mild mitral valve prolapse of the anterior leaflet. There is mild to moderate mitral valve regurgitation eccentrically directed towards the lateral wall. No evidence of mitral stenosis.  8. The tricuspid valve is normal in structure. Tricuspid valve regurgitation is mild.  9. The aortic valve is tricuspid. Aortic valve regurgitation is trivial. No evidence of aortic valve sclerosis or stenosis. 10. The pulmonic valve was normal in structure. Pulmonic valve regurgitation is not visualized. 11. Mildly elevated pulmonary artery systolic pressure. 12. The inferior vena cava is dilated in size with <50% respiratory variability, suggesting right atrial pressure of 15 mmHg. 13. Trivial pericardial effusion is present. 14. The pericardial effusion is posterior to the left ventricle. FINDINGS  Left Ventricle: Left ventricular ejection fraction, by visual estimation, is 55 to 60%. The left ventricle has normal function. The left ventricle has no regional wall motion abnormalities. There is no left ventricular hypertrophy. The left ventricular diastology could not be evaluated due to atrial fibrillation. Left ventricular diastolic function could not be evaluated. Indeterminate filling pressures. Right Ventricle: No increase in right ventricular wall thickness. Global RV systolic function is has moderately reduced systolic function. The tricuspid regurgitant velocity is 2.26 m/s, and with an assumed right atrial pressure of 15 mmHg, the estimated  right ventricular systolic pressure is mildly elevated at 35.5 mmHg. Left Atrium: Left atrial size was normal in size. Right Atrium: Right atrial size was severely  dilated Pericardium:  Trivial pericardial effusion is present. The pericardial effusion is posterior to the left ventricle. Mitral Valve: The mitral valve is normal in structure. There is mild late systolic prolapse of of the mitral valve. Mild to moderate mitral valve regurgitation, with eccentric laterally directed jet. No evidence of mitral valve stenosis by observation. Tricuspid Valve: The tricuspid valve is normal in structure. Tricuspid valve regurgitation is mild. Aortic Valve: The aortic valve is tricuspid. Aortic valve regurgitation is trivial. The aortic valve is structurally normal, with no evidence of sclerosis or stenosis. Pulmonic Valve: The pulmonic valve was normal in structure. Pulmonic valve regurgitation is not visualized. Pulmonic regurgitation is not visualized. Aorta: The aortic root, ascending aorta and aortic arch are all structurally normal, with no evidence of dilitation or obstruction. Venous: The inferior vena cava is dilated in size with less than 50% respiratory variability, suggesting right atrial pressure of 15 mmHg. IAS/Shunts: No atrial level shunt detected by color flow Doppler. There is no evidence of a patent foramen ovale. No ventricular septal defect is seen or detected. There is no evidence of an atrial septal defect.  LEFT VENTRICLE PLAX 2D LVIDd:         4.20 cm  Diastology LVIDs:         2.90 cm  LV e' lateral: 5.68 cm/s LV PW:         0.90 cm  LV e' medial:  4.54 cm/s LV IVS:        0.70 cm LVOT diam:     1.90 cm LV SV:         46 ml LV SV Index:   28.36 LVOT Area:     2.84 cm  RIGHT VENTRICLE RV S prime:     5.76 cm/s TAPSE (M-mode): 1.1 cm LEFT ATRIUM             Index       RIGHT ATRIUM           Index LA diam:        4.20 cm 2.56 cm/m  RA Area:     20.70 cm LA Vol (A2C):   52.2 ml 31.86 ml/m RA Volume:   65.20 ml  39.79 ml/m LA Vol (A4C):   48.0 ml 29.29 ml/m LA Biplane Vol: 53.1 ml 32.41 ml/m   AORTA Ao Root diam: 2.80 cm MR Peak grad: 81.7 mmHg   TRICUSPID  VALVE MR Vmax:      452.00 cm/s TV Peak grad:   31.6 mmHg                           TV Vmax:        2.81 m/s                           TR Peak grad:   20.5 mmHg                           TR Vmax:        251.00 cm/s                            SHUNTS                           Systemic Diam: 1.90 cm  Fransico Him MD Electronically signed  by Fransico Him MD Signature Date/Time: 01/31/2020/11:24:29 AM    Final     Cardiac Studies   Echo as above   Patient Profile     86 y.o. female admitted from the office with acute on chronic diastolic heart failure.   Assessment & Plan    Acute on chronic diastolic heart failure: -echo done yesterday with preserved EF -resting symptoms have improved, but still short of breath with any type of exertion. -diuresing well with net negative of 2.3L. Weight is down another 2 kg  -given she is still volume up on exam and continues to have symptoms, would benefit form another day of IV diuresing. Can look at transitioning to oral tomorrow.  -was on furosemide 40 mg daily at home. Will likely need increased dose at discharge.  Atrial fibrillation, permanent -rate controlled on atenolol -CHA2DS2/VAS Stroke Risk Points= 5 -based on age, weight she is on appropriate dose of apixaban 2.5 mg BID  Muscle cramps: unclear etiology -has been on simvastatin for many years for hyperlipidemia -will continue to monitor potassium and supplement as needed -could consider adding low dose gabapentin at night if continues to be symptomatic    Hypertension: well controlled  For questions or updates, please contact [place] Please consult [email or web address] for contact info under        Signed, Delice Bison, DO  02/01/2020, 8:15 AM

## 2020-02-01 NOTE — Progress Notes (Addendum)
  ReDS Clip Diuretic Study Pt study # [id number]  Your patient has been enrolled in the ReDS Clip Diuretic Study   Changes to prescribed diuretics recommended:  Increase furosemide to 80 mg IV TID Provider contacted: Dr. Harrell Gave Recommendation was not accepted by provider  Continue furosemide 80 mg IV q12h   REDS Clip  READING= 42%  CHEST RULER = 22 Clip Station = A  Orthodema score = 1 Signs/Symptoms Score   Mild edema, no orthopnea 0 No congestion  Moderate edema, no orthopnea 1 Low-grade orthodema/congestion  Severe edema OR orthopnea 2   Moderate edema and orthopnea 3 High-grade orthodema/congestion  Severe edema AND orthopnea 4

## 2020-02-01 NOTE — Evaluation (Signed)
Physical Therapy Evaluation Patient Details Name: [name] Dyer MRN: [id number] DOB: 1932/01/01 Today's Date: 02/01/2020   History of Present Illness  Pt is an 86 yo female with a PMH sig for afib, CHF, HTN, and dementia. Pt presented for worsening SOB with edema due to diastolic HF exacerbation.  Clinical Impression  The pt was in bed upon arrival of PT, agreeable to PT evaluation this morning. The pt presents with limitations in functional mobility, stability, and activity tolerance compared to her prior level of function and independence due to above dx. The pt's course is further complicated by cog deficits from her dementia at baseline which limit her problem-solving and safety awareness at this time. The pt was able to demonstrate good bed mobility, but required modA of 1 to stand from EOB and transfer to the recliner in the room. The pt voiced preference of going home with family and HHPT but was unable to give clear answer on availability of sons/family to provide supervision and assist as needed for her upon returning home. PT will continue to follow acutely.      Follow Up Recommendations Other (comment);Supervision/Assistance - 24 hour(Short Term SNF for rehab prior to d/c home if supervision from family cannot be arranged. Pt reports she prefers home with HHPT, but does not appear to be aware of her deficits/safety at this time)    Equipment Recommendations  Rolling walker with 5" wheels    Recommendations for Other Services       Precautions / Restrictions Precautions Precautions: Fall Precaution Comments: pt with incontinence issues Restrictions Weight Bearing Restrictions: No      Mobility  Bed Mobility Overal bed mobility: Needs Assistance Bed Mobility: Supine to Sit     Supine to sit: Supervision     General bed mobility comments: pt used bed rails, but needed no assist to come to sitting EOB. Supervision for management of lines and  purewick  Transfers Overall transfer level: Needs assistance Equipment used: 1 person hand held assist;Rolling walker (2 wheeled) Transfers: Sit to/from Stand Sit to Stand: Min assist;Mod assist         General transfer comment: modA of 1 to stand with HHA due to pt difficulty with functional use of strength in LE. Pt struggles with momentum and strength to power up. Slightly improved with positioning of RW, still requires min/modA of 1 to steady RW and assist with power up.  Ambulation/Gait Ambulation/Gait assistance: Min assist Gait Distance (Feet): 5 Feet Assistive device: 1 person hand held assist Gait Pattern/deviations: Step-through pattern;Shuffle;Decreased stride length;Narrow base of support   Gait velocity interpretation: <1.8 ft/sec, indicate of risk for recurrent falls General Gait Details: pt with small shuffle steps to ambulate to recliner in room. minA through HHA to steady and maintain upright. no LOB  Stairs            Wheelchair Mobility    Modified Rankin (Stroke Patients Only)       Balance Overall balance assessment: Needs assistance Sitting-balance support: Single extremity supported;Feet supported Sitting balance-Leahy Scale: Fair Sitting balance - Comments: able to sit without UE support, supervision     Standing balance-Leahy Scale: Poor Standing balance comment: requires UE support to stabilize for static and dynamic standing activities                             Pertinent Vitals/Pain Pain Assessment: Faces Faces Pain Scale: Hurts a little bit Pain Location: L big  toe, pr reports she has an ingrown toenail Pain Descriptors / Indicators: Sore Pain Intervention(s): Limited activity within patient's tolerance;Monitored during session    Home Living Family/patient expects to be discharged to:: Private residence Living Arrangements: Spouse/significant other Available Help at Discharge: Family;Available PRN/intermittently(Pt  reports her husband has caregiver 6 hrs/day and that sons live nearby, but she is unsure how often they can come around for assistance) Type of Home: House Home Access: Stairs to enter;Ramped entrance Entrance Stairs-Rails: None Entrance Stairs-Number of Steps: pt reports presence of both a ramp and 2 stairs in garage. unclear Home Layout: One level Home Equipment: Grab bars - tub/shower;Hand held shower head;Cane - single point Additional Comments: Pt is unreliable historian. Reports her husband has RW and WC for his use, but did not clarify if there was any equipment she uses on a regular basis other than a cane she uses "for protection from wild animals" when going to the mailbox.    Prior Function Level of Independence: Needs assistance      ADL's / Homemaking Assistance Needed: Pt reports she does some cooking for breakfast and assisting husband, but that caretaker (9-3pm daily) mostly helps with caring for her husband. Pt reports she is not driving and therefore others assist with running errands as needed.  Comments: Pt is unreliable historian, answered most questions about her daily activities and assistance needed by discussing how much assist her husband needs. Reported difficulty with dressing and bathing, but reports she does not currently need assist.     Hand Dominance   Dominant Hand: Right    Extremity/Trunk Assessment   Upper Extremity Assessment Upper Extremity Assessment: Generalized weakness    Lower Extremity Assessment Lower Extremity Assessment: Generalized weakness(pt with generally 4-/5 in BLE, but poor functional use of strength.)    Cervical / Trunk Assessment Cervical / Trunk Assessment: Kyphotic  Communication   Communication: No difficulties  Cognition Arousal/Alertness: Awake/alert Behavior During Therapy: WFL for tasks assessed/performed Overall Cognitive Status: No family/caregiver present to determine baseline cognitive functioning Area of  Impairment: Attention;Memory;Following commands;Safety/judgement;Problem solving                   Current Attention Level: Focused Memory: Decreased recall of precautions Following Commands: Follows one step commands with increased time Safety/Judgement: Decreased awareness of safety;Decreased awareness of deficits   Problem Solving: Slow processing;Decreased initiation;Difficulty sequencing;Requires verbal cues General Comments: Pt with sig deficits noted during session regarding problem-solving and safety awareness. Unable to determine if this is her baseline, but the pt seemed unaware of any deficits and responded to all questions about safety planning at home with information about how she and family care for her husband.      General Comments      Exercises General Exercises - Lower Extremity Ankle Circles/Pumps: AROM;Both;10 reps;Seated Long Arc Quad: AROM;Both;10 reps;Seated Hip Flexion/Marching: AROM;Both;10 reps;Seated   Assessment/Plan    PT Assessment Patient needs continued PT services  PT Problem List Decreased strength;Decreased mobility;Decreased safety awareness;Decreased coordination;Decreased knowledge of precautions;Decreased activity tolerance;Decreased cognition;Decreased balance       PT Treatment Interventions DME instruction;Therapeutic exercise;Gait training;Balance training;Stair training;Functional mobility training;Therapeutic activities;Cognitive remediation;Patient/family education    PT Goals (Current goals can be found in the Care Plan section)  Acute Rehab PT Goals Patient Stated Goal: return home to be with husband PT Goal Formulation: With patient Time For Goal Achievement: 02/15/20 Potential to Achieve Goals: Fair    Frequency Min 3X/week   Barriers to discharge Decreased caregiver support Pt  reports caregiver for husband 6hrs/day as well as son's nearby, but states no one is able to provide 24/7 supervision or even supervision for  mobility. The pt also reports that the caregiver is for the husband, and therefore is not there to help her.    Co-evaluation               AM-PAC PT "6 Clicks" Mobility  Outcome Measure Help needed turning from your back to your side while in a flat bed without using bedrails?: A Little Help needed moving from lying on your back to sitting on the side of a flat bed without using bedrails?: A Little Help needed moving to and from a bed to a chair (including a wheelchair)?: A Lot Help needed standing up from a chair using your arms (e.g., wheelchair or bedside chair)?: A Lot Help needed to walk in hospital room?: A Little Help needed climbing 3-5 steps with a railing? : A Lot 6 Click Score: 15    End of Session Equipment Utilized During Treatment: Gait belt Activity Tolerance: Patient tolerated treatment well;Other (comment)(limited by incontinence) Patient left: in chair;with chair alarm set;with call bell/phone within reach Nurse Communication: Mobility status(cognition) PT Visit Diagnosis: Other abnormalities of gait and mobility (R26.89);Muscle weakness (generalized) (M62.81);Difficulty in walking, not elsewhere classified (R26.2)    Time: 9030-0923 PT Time Calculation (min) (ACUTE ONLY): 58 min   Charges:   PT Evaluation $PT Eval Moderate Complexity: 1 Mod PT Treatments $Gait Training: 8-22 mins $Therapeutic Exercise: 8-22 mins $Therapeutic Activity: 8-22 mins        Karma Ganja, PT, DPT   Acute Rehabilitation Department Pager #: [phone]  Otho Bellows 02/01/2020, 11:17 AM

## 2020-02-01 NOTE — Progress Notes (Signed)
To the best of my knowledge, the student's charting is accurate.  

## 2020-02-01 NOTE — Progress Notes (Signed)
[name] son, [name], currently at bedside, requested an update. Nurse provided update regarding plan of care. All questions and concerns were answered. [name] also requests to be update by MD if anything changes with patient discharge plan. Education was given to [name] and patient on 2 gram low sodium diet. Patient states she does not want to be discharged to SNF once discharged, but agreeable to [facility]

## 2020-02-01 NOTE — Plan of Care (Signed)
  Problem: Education: Goal: Ability to verbalize understanding of medication therapies will improve Outcome: Progressing   Problem: Nutrition: Goal: Adequate nutrition will be maintained Outcome: Progressing   Problem: Activity: Goal: Risk for activity intolerance will decrease Outcome: Progressing

## 2020-02-02 LAB — BASIC METABOLIC PANEL
Anion gap: 12 (ref 5–15)
BUN: 30 mg/dL — ABNORMAL HIGH (ref 8–23)
CO2: 27 mmol/L (ref 22–32)
Calcium: 8.9 mg/dL (ref 8.9–10.3)
Chloride: 101 mmol/L (ref 98–111)
Creatinine, Ser: 1.04 mg/dL — ABNORMAL HIGH (ref 0.44–1.00)
GFR calc Af Amer: 57 mL/min — ABNORMAL LOW (ref >60)
GFR calc non Af Amer: 49 mL/min — ABNORMAL LOW (ref >60)
Glucose, Bld: 95 mg/dL (ref 70–99)
Potassium: 3.9 mmol/L (ref 3.5–5.1)
Sodium: 140 mmol/L (ref 135–145)

## 2020-02-02 MED ORDER — FUROSEMIDE 40 MG PO TABS: 40.0000 mg | ORAL_TABLET | Freq: Every day | ORAL | 0 refills | Status: DC

## 2020-02-02 MED ORDER — FUROSEMIDE 40 MG PO TABS
40.0000 mg | ORAL_TABLET | Freq: Every day | ORAL | Status: DC
  Administered 2020-02-02: 40 mg via ORAL

## 2020-02-02 NOTE — Progress Notes (Signed)
Progress Note  Patient Name: [name] Dyer Date of Encounter: 02/02/2020  Primary Cardiologist: [name], MD   Subjective   Feels much better today. Was able to ambulate with staff around the room. Had a good BM. Breathing has improved. Denies chest pain.   Inpatient Medications    Scheduled Meds: . apixaban  2.5 mg Oral BID  . atenolol  25 mg Oral QHS  . atenolol  50 mg Oral Daily  . cholecalciferol  2,000 Units Oral Daily  . donepezil  10 mg Oral QHS  . furosemide  80 mg Intravenous Q12H  . gabapentin  300 mg Oral TID  . Melatonin  4.5 mg Oral Once  . potassium chloride  20 mEq Oral BID  . simvastatin  10 mg Oral Daily  . sodium chloride flush  3 mL Intravenous Q12H   Continuous Infusions: . sodium chloride     PRN Meds: sodium chloride, acetaminophen, ipratropium, ondansetron (ZOFRAN) IV, sodium chloride flush   Vital Signs    Vitals:   02/01/20 0903 02/01/20 1415 02/01/20 2007 02/02/20 0408  BP: 118/66 108/64 134/89 113/63  Pulse: 76 77 89 81  Resp: _0 Temp: 97.7 F (36.5 C) 97.8 F (36.6 C) 97.8 F (36.6 C) (!) 97 F (36.1 C)  TempSrc: Oral Oral Oral Oral  SpO2: 96% 100% 95% 93%  Weight:    55.6 kg  Height:        Intake/Output Summary (Last 24 hours) at 02/02/2020 0752 Last data filed at 02/02/2020 0432 Gross per 24 hour  Intake 1438 ml  Output 2500 ml  Net -1062 ml   Last 3 Weights 02/02/2020 02/01/2020 01/31/2020  Weight (lbs) 122 lb 9.6 oz 124 lb 4.8 oz 128 lb 6.4 oz  Weight (kg) 55.611 kg 56.382 kg 58.242 kg      Telemetry    Afib, rate controlled - Personally Reviewed  ECG    Afib - Personally Reviewed  Physical Exam   GEN: No acute distress.   Neck: No JVD Cardiac: irregularly irregular, SEM. No rubs or gallops.  Respiratory: Clear to auscultation bilaterally. GI: Soft, nontender, non-distended  MS: No edema; No deformity. Neuro:  Nonfocal  Psych: Normal affect   Labs    High Sensitivity Troponin:  No results for  input(s): TROPONINIHS in the last 720 hours.    Chemistry Recent Labs  Lab 01/30/20 1555 01/30/20 1555 01/31/20 0358 02/01/20 0532 02/02/20 0338  NA 140   < > 143 144 140  K 4.2   < > 3.6 4.0 3.9  CL 103   < > 102 100 101  CO2 26   < > _1 GLUCOSE 100*   < > 94 91 95  BUN 21   < > 20 23 30*  CREATININE 1.03*   < > 0.96 1.04* 1.04*  CALCIUM 9.6   < > 9.1 9.2 8.9  PROT 7.0  --   --   --   --   ALBUMIN 3.9  --   --   --   --   AST 40  --   --   --   --   ALT 25  --   --   --   --   ALKPHOS 107  --   --   --   --   BILITOT 1.5*  --   --   --   --   GFRNONAA 50*   < > 54*  49* 49*  GFRAA 57*   < > >60 57* 57*  ANIONGAP 11   < > 13 16* 12   < > = values in this interval not displayed.     Hematology Recent Labs  Lab 01/30/20 1555  WBC 7.4  RBC 4.52  HGB 13.2  HCT 43.0  MCV 95.1  MCH 29.2  MCHC 30.7  RDW 14.6  PLT PLATELET CLUMPS NOTED ON SMEAR, UNABLE TO ESTIMATE    BNP Recent Labs  Lab 01/30/20 1555  BNP 617.8*     DDimer No results for input(s): DDIMER in the last 168 hours.   Radiology    ECHOCARDIOGRAM COMPLETE  Result Date: 01/31/2020   ECHOCARDIOGRAM REPORT   Patient Name:   [name] Dyer Date of Exam: 01/31/2020 Medical Rec #:  [id number]         Height:       65.0 in Accession #:    [id number]        Weight:       128.4 lb Date of Birth:  [birth year]         BSA:          1.64 m Patient Age:    11 years          BP:           119/76 mmHg Patient Gender: F                 HR:           92 bpm. Exam Location:  Inpatient Procedure: 2D Echo Indications:    CHF- Acute Diastolic L87.56  History:        Patient has prior history of Echocardiogram examinations, most                 recent 06/09/2018. CHF; Risk Factors:Hypertension and                 Dyslipidemia.  Sonographer:    Mikki Santee RDCS (AE) Referring Phys: [id number] PAULA V ROSS IMPRESSIONS  1. Left ventricular ejection fraction, by visual estimation, is 55 to 60%. The left ventricle has normal  function. There is no left ventricular hypertrophy.  2. Left ventricular diastolic function could not be evaluated due to atrial fibrillation.  3. The left ventricle has no regional wall motion abnormalities.  4. Global right ventricle has moderately reduced systolic function.The right ventricular size is mildly enlarged. No increase in right ventricular wall thickness.  5. Left atrial size was normal.  6. Right atrial size was severely dilated.  7. The mitral valve is normal in structure. Mild mitral valve prolapse of the anterior leaflet. There is mild to moderate mitral valve regurgitation eccentrically directed towards the lateral wall. No evidence of mitral stenosis.  8. The tricuspid valve is normal in structure. Tricuspid valve regurgitation is mild.  9. The aortic valve is tricuspid. Aortic valve regurgitation is trivial. No evidence of aortic valve sclerosis or stenosis. 10. The pulmonic valve was normal in structure. Pulmonic valve regurgitation is not visualized. 11. Mildly elevated pulmonary artery systolic pressure. 12. The inferior vena cava is dilated in size with <50% respiratory variability, suggesting right atrial pressure of 15 mmHg. 13. Trivial pericardial effusion is present. 14. The pericardial effusion is posterior to the left ventricle. FINDINGS  Left Ventricle: Left ventricular ejection fraction, by visual estimation, is 55 to 60%. The left ventricle has normal function. The left ventricle has no regional wall motion abnormalities. There is  no left ventricular hypertrophy. The left ventricular diastology could not be evaluated due to atrial fibrillation. Left ventricular diastolic function could not be evaluated. Indeterminate filling pressures. Right Ventricle: No increase in right ventricular wall thickness. Global RV systolic function is has moderately reduced systolic function. The tricuspid regurgitant velocity is 2.26 m/s, and with an assumed right atrial pressure of 15 mmHg, the  estimated  right ventricular systolic pressure is mildly elevated at 35.5 mmHg. Left Atrium: Left atrial size was normal in size. Right Atrium: Right atrial size was severely dilated Pericardium: Trivial pericardial effusion is present. The pericardial effusion is posterior to the left ventricle. Mitral Valve: The mitral valve is normal in structure. There is mild late systolic prolapse of of the mitral valve. Mild to moderate mitral valve regurgitation, with eccentric laterally directed jet. No evidence of mitral valve stenosis by observation. Tricuspid Valve: The tricuspid valve is normal in structure. Tricuspid valve regurgitation is mild. Aortic Valve: The aortic valve is tricuspid. Aortic valve regurgitation is trivial. The aortic valve is structurally normal, with no evidence of sclerosis or stenosis. Pulmonic Valve: The pulmonic valve was normal in structure. Pulmonic valve regurgitation is not visualized. Pulmonic regurgitation is not visualized. Aorta: The aortic root, ascending aorta and aortic arch are all structurally normal, with no evidence of dilitation or obstruction. Venous: The inferior vena cava is dilated in size with less than 50% respiratory variability, suggesting right atrial pressure of 15 mmHg. IAS/Shunts: No atrial level shunt detected by color flow Doppler. There is no evidence of a patent foramen ovale. No ventricular septal defect is seen or detected. There is no evidence of an atrial septal defect.  LEFT VENTRICLE PLAX 2D LVIDd:         4.20 cm  Diastology LVIDs:         2.90 cm  LV e' lateral: 5.68 cm/s LV PW:         0.90 cm  LV e' medial:  4.54 cm/s LV IVS:        0.70 cm LVOT diam:     1.90 cm LV SV:         46 ml LV SV Index:   28.36 LVOT Area:     2.84 cm  RIGHT VENTRICLE RV S prime:     5.76 cm/s TAPSE (M-mode): 1.1 cm LEFT ATRIUM             Index       RIGHT ATRIUM           Index LA diam:        4.20 cm 2.56 cm/m  RA Area:     20.70 cm LA Vol (A2C):   52.2 ml 31.86 ml/m  RA Volume:   65.20 ml  39.79 ml/m LA Vol (A4C):   48.0 ml 29.29 ml/m LA Biplane Vol: 53.1 ml 32.41 ml/m   AORTA Ao Root diam: 2.80 cm MR Peak grad: 81.7 mmHg   TRICUSPID VALVE MR Vmax:      452.00 cm/s TV Peak grad:   31.6 mmHg                           TV Vmax:        2.81 m/s                           TR Peak grad:   20.5 mmHg  TR Vmax:        251.00 cm/s                            SHUNTS                           Systemic Diam: 1.90 cm  Fransico Him MD Electronically signed by Fransico Him MD Signature Date/Time: 01/31/2020/11:24:29 AM    Final     Cardiac Studies   Echo as above   Patient Profile     86 y.o. female admitted from the office with acute on chronic diastolic heart failure  Assessment & Plan    Acute on chronic diastolic heart failure: -echo done 2/2 with preserved EF -resting symptoms have improved, but still short of breath with any type of exertion. -net negative another liter with IV diuretic yesterday. Weight is down 1 kg  -appears nearly euvolemic and symptomatically improved. Will transition to oral Lasix today.   Atrial fibrillation, permanent -rate controlled on atenolol -CHA2DS2/VAS Stroke Risk Points=5 -based on age, weight she is on appropriate dose of apixaban 2.5 mg BID  Muscle cramps: unclear etiology  -resolved -will continue to monitor potassium and supplement as needed  Hypertension: well controlled  For questions or updates, please contact [place] Please consult [email or web address] for contact info under        Signed, Delice Bison, DO  02/02/2020, 7:52 AM

## 2020-02-02 NOTE — Progress Notes (Signed)
  ReDS Clip Diuretic Study Pt study # [id number]  Your patient has been enrolled in the ReDS Clip Diuretic Study   Changes to prescribed diuretics recommended:  Transition to PO diuretics Provider contacted: Dr. Harrell Gave Recommendation was accepted by provider   REDS Clip  READING= 33%  CHEST RULER = 24 Clip Station = A  Orthodema score = 0 Signs/Symptoms Score   Mild edema, no orthopnea 0 No congestion  Moderate edema, no orthopnea 1 Low-grade orthodema/congestion  Severe edema OR orthopnea 2   Moderate edema and orthopnea 3 High-grade orthodema/congestion  Severe edema AND orthopnea 4

## 2020-02-02 NOTE — TOC Transition Note (Addendum)
Transition of Care [facility]) - CM/SW Discharge Note   Patient Details  Name: [name] Dyer MRN: [id number] Date of Birth: September 25, 1932  [facility] CM/SW Contact:  Zenon Mayo, RN Phone Number: 02/02/2020, 11:12 AM   Clinical Narrative:    NCM spoke with patient offered choice, she states she has [facility], NCM contacted Cassie with [facility] to confirm.  Awaiting call back.  NCM made referral to [facility] with [employer] for rolling walker, he will bring to patient room. Cassie states she is not active but she can take patient for HHPT.  Soc will begin 24 to 48 hrs post dc. Patient states she has a Therapist, [facility] with [facility].    Final next level of care: [place] Barriers to Discharge: No Barriers Identified   Patient Goals and CMS Choice Patient states their goals for this hospitalization and ongoing recovery are:: get better CMS Medicare.gov Compare Post Acute Care list provided to:: Patient Choice offered to / list presented to : Patient  Discharge Placement                       Discharge Plan and Services                DME Arranged: Walker rolling DME Agency: [employer] Date DME Agency Contacted: 02/02/20 Time DME Agency Contacted: [phone] Representative spoke with at DME Agency: Thedore Mins HH Arranged: PT [place] Date [place]: 02/02/20 Time [place]: 1112 Representative spoke with at [place]: Cassie  Social Determinants of Health ([place]) Interventions     Readmission Risk Interventions No flowsheet data found.

## 2020-02-02 NOTE — Progress Notes (Addendum)
Discharge education and medication education given to patient and daughter-in-law with teach back. Education on low sodium diet, fluid restriction, and increasing activity slowly given.  All questions and concerns answered. Peripheral IV and telemetry leads removed. All patient belongings given to patient. Patient transported to main entrance via wheelchair by nurse.

## 2020-02-03 NOTE — Discharge Summary (Signed)
Discharge Summary    Patient ID: [name] Dyer MRN: [id number]; DOB: 12-29-1932  Admit date: 01/30/2020 Discharge date: 02/02/2020 Primary Care Provider: [name], MD  Primary Cardiologist: [name], MD  Primary Electrophysiologist:  None   Discharge Diagnoses    Active Problems:   CHF (congestive heart failure) ([place])   Acute on chronic diastolic (congestive) heart failure ([place])  Diagnostic Studies/Procedures    Echo 01/31/20 1. Left ventricular ejection fraction, by visual estimation, is 55 to  60%. The left ventricle has normal function. There is no left ventricular  hypertrophy.  2. Left ventricular diastolic function could not be evaluated due to  atrial fibrillation.  3. The left ventricle has no regional wall motion abnormalities.  4. Global right ventricle has moderately reduced systolic function.The  right ventricular size is mildly enlarged. No increase in right  ventricular wall thickness.  5. Left atrial size was normal.  6. Right atrial size was severely dilated.  7. The mitral valve is normal in structure. Mild mitral valve prolapse of  the anterior leaflet. There is mild to moderate mitral valve regurgitation  eccentrically directed towards the lateral wall. No evidence of mitral  stenosis.  8. The tricuspid valve is normal in structure. Tricuspid valve  regurgitation is mild.  9. The aortic valve is tricuspid. Aortic valve regurgitation is trivial.  No evidence of aortic valve sclerosis or stenosis.  10. The pulmonic valve was normal in structure. Pulmonic valve  regurgitation is not visualized.  11. Mildly elevated pulmonary artery systolic pressure.  12. The inferior vena cava is dilated in size with <50% respiratory  variability, suggesting right atrial pressure of 15 mmHg.  13. Trivial pericardial effusion is present.  14. The pericardial effusion is posterior to the left ventricle.  _____________   History of Present Illness      [name] Dyer is a 86 y.o. female with a history of permanent atrial fibrillation, chronic diastolic heart failure, hypertension who is followed closely in cardiology clinic by Dr. Harrington Challenger. She was seen in the office 01/30/20 and noted to be very short of breath with significant edema. She had been managed with increased outpatient diuresis, but due to worsening symptoms she was admitted for heart failure management.  Hospital Course     Consultants: none  1. Acute on chronic diastolic heart failure: -Diuresed well with 80 IV furosemide BID. -Discharge weight 55.6 kg, admission weight 60.9 kg -Net negative almost 6 L during the admission -ReDs vest followed during admission -echo done this admission with preserved EF -Discharged on 40 mg oral lasix daily. Prior home dose had been 20 mg daily with intermittent increase to 40 mg at home for increased symptoms. -She was given heart failure education, including daily weights. Strict instructions on monitoring weights, as she may require higher baseline diuretics if her weight rises at discharge  2. Permanent atrial fibrillation.    -Chadvasc=5  -Continued apixaban 2.5 mg BID given age and weight  -continue atenolol for rate control  3. Hypertension: well controlled, no change to medications this admission  4. Deconditioning: patient seen by PT, recommended SNF. She decline this and instead was set up for home health PT.  Did the patient have an acute coronary syndrome (MI, NSTEMI, STEMI, etc) this admission?:  No                               Did the patient have a percutaneous coronary  intervention (stent / angioplasty)?:  No.   _____________  Discharge Vitals Blood pressure 112/73, pulse 74, temperature (!) 97 F (36.1 C), temperature source Oral, resp. rate 16, height _0  (1.651 m), weight 55.6 kg, SpO2 95 %.  Filed Weights   01/31/20 0504 02/01/20 0523 02/02/20 0408  Weight: 58.2 kg 56.4 kg 55.6 kg    Labs & Radiologic Studies     CBC No results for input(s): WBC, NEUTROABS, HGB, HCT, MCV, PLT in the last 72 hours. Basic Metabolic Panel Recent Labs    02/01/20 0532 02/02/20 0338  NA 144 140  K 4.0 3.9  CL 100 101  CO2 28 27  GLUCOSE 91 95  BUN 23 30*  CREATININE 1.04* 1.04*  CALCIUM 9.2 8.9   Liver Function Tests No results for input(s): AST, ALT, ALKPHOS, BILITOT, PROT, ALBUMIN in the last 72 hours. No results for input(s): LIPASE, AMYLASE in the last 72 hours. High Sensitivity Troponin:   No results for input(s): TROPONINIHS in the last 720 hours.  BNP Invalid input(s): POCBNP D-Dimer No results for input(s): DDIMER in the last 72 hours. Hemoglobin A1C No results for input(s): HGBA1C in the last 72 hours. Fasting Lipid Panel No results for input(s): CHOL, HDL, LDLCALC, TRIG, CHOLHDL, LDLDIRECT in the last 72 hours. Thyroid Function Tests No results for input(s): TSH, T4TOTAL, T3FREE, THYROIDAB in the last 72 hours.  Invalid input(s): FREET3 _____________  ECHOCARDIOGRAM COMPLETE  Result Date: 01/31/2020   ECHOCARDIOGRAM REPORT   Patient Name:   [name] Dyer Date of Exam: 01/31/2020 Medical Rec #:  [id number]         Height:       65.0 in Accession #:    [id number]        Weight:       128.4 lb Date of Birth:  February 23, 1932         BSA:          1.64 m Patient Age:    25 years          BP:           119/76 mmHg Patient Gender: F                 HR:           92 bpm. Exam Location:  Inpatient Procedure: 2D Echo Indications:    CHF- Acute Diastolic Y40.34  History:        Patient has prior history of Echocardiogram examinations, most                 recent 06/09/2018. CHF; Risk Factors:Hypertension and                 Dyslipidemia.  Sonographer:    Mikki Santee RDCS (AE) Referring Phys: [id number] PAULA V ROSS IMPRESSIONS  1. Left ventricular ejection fraction, by visual estimation, is 55 to 60%. The left ventricle has normal function. There is no left ventricular hypertrophy.  2. Left ventricular diastolic  function could not be evaluated due to atrial fibrillation.  3. The left ventricle has no regional wall motion abnormalities.  4. Global right ventricle has moderately reduced systolic function.The right ventricular size is mildly enlarged. No increase in right ventricular wall thickness.  5. Left atrial size was normal.  6. Right atrial size was severely dilated.  7. The mitral valve is normal in structure. Mild mitral valve prolapse of the anterior leaflet. There is mild to moderate mitral valve regurgitation eccentrically directed  towards the lateral wall. No evidence of mitral stenosis.  8. The tricuspid valve is normal in structure. Tricuspid valve regurgitation is mild.  9. The aortic valve is tricuspid. Aortic valve regurgitation is trivial. No evidence of aortic valve sclerosis or stenosis. 10. The pulmonic valve was normal in structure. Pulmonic valve regurgitation is not visualized. 11. Mildly elevated pulmonary artery systolic pressure. 12. The inferior vena cava is dilated in size with <50% respiratory variability, suggesting right atrial pressure of 15 mmHg. 13. Trivial pericardial effusion is present. 14. The pericardial effusion is posterior to the left ventricle. FINDINGS  Left Ventricle: Left ventricular ejection fraction, by visual estimation, is 55 to 60%. The left ventricle has normal function. The left ventricle has no regional wall motion abnormalities. There is no left ventricular hypertrophy. The left ventricular diastology could not be evaluated due to atrial fibrillation. Left ventricular diastolic function could not be evaluated. Indeterminate filling pressures. Right Ventricle: No increase in right ventricular wall thickness. Global RV systolic function is has moderately reduced systolic function. The tricuspid regurgitant velocity is 2.26 m/s, and with an assumed right atrial pressure of 15 mmHg, the estimated  right ventricular systolic pressure is mildly elevated at 35.5 mmHg. Left  Atrium: Left atrial size was normal in size. Right Atrium: Right atrial size was severely dilated Pericardium: Trivial pericardial effusion is present. The pericardial effusion is posterior to the left ventricle. Mitral Valve: The mitral valve is normal in structure. There is mild late systolic prolapse of of the mitral valve. Mild to moderate mitral valve regurgitation, with eccentric laterally directed jet. No evidence of mitral valve stenosis by observation. Tricuspid Valve: The tricuspid valve is normal in structure. Tricuspid valve regurgitation is mild. Aortic Valve: The aortic valve is tricuspid. Aortic valve regurgitation is trivial. The aortic valve is structurally normal, with no evidence of sclerosis or stenosis. Pulmonic Valve: The pulmonic valve was normal in structure. Pulmonic valve regurgitation is not visualized. Pulmonic regurgitation is not visualized. Aorta: The aortic root, ascending aorta and aortic arch are all structurally normal, with no evidence of dilitation or obstruction. Venous: The inferior vena cava is dilated in size with less than 50% respiratory variability, suggesting right atrial pressure of 15 mmHg. IAS/Shunts: No atrial level shunt detected by color flow Doppler. There is no evidence of a patent foramen ovale. No ventricular septal defect is seen or detected. There is no evidence of an atrial septal defect.  LEFT VENTRICLE PLAX 2D LVIDd:         4.20 cm  Diastology LVIDs:         2.90 cm  LV e' lateral: 5.68 cm/s LV PW:         0.90 cm  LV e' medial:  4.54 cm/s LV IVS:        0.70 cm LVOT diam:     1.90 cm LV SV:         46 ml LV SV Index:   28.36 LVOT Area:     2.84 cm  RIGHT VENTRICLE RV S prime:     5.76 cm/s TAPSE (M-mode): 1.1 cm LEFT ATRIUM             Index       RIGHT ATRIUM           Index LA diam:        4.20 cm 2.56 cm/m  RA Area:     20.70 cm LA Vol (A2C):   52.2 ml 31.86 ml/m RA Volume:  65.20 ml  39.79 ml/m LA Vol (A4C):   48.0 ml 29.29 ml/m LA Biplane Vol:  53.1 ml 32.41 ml/m   AORTA Ao Root diam: 2.80 cm MR Peak grad: 81.7 mmHg   TRICUSPID VALVE MR Vmax:      452.00 cm/s TV Peak grad:   31.6 mmHg                           TV Vmax:        2.81 m/s                           TR Peak grad:   20.5 mmHg                           TR Vmax:        251.00 cm/s                            SHUNTS                           Systemic Diam: 1.90 cm  Fransico Him MD Electronically signed by Fransico Him MD Signature Date/Time: 01/31/2020/11:24:29 AM    Final    Disposition   Pt is being discharged home today in good condition.  Follow-up Plans & Appointments    Follow-up Information    [name], MD.   Specialty: Internal Medicine Why: The office will call patient Contact information: [address] [place] [address] [phone]        [facility] Follow up.   Why: rolling walker Contact information: [place] [address] [phone]        [facility] Follow up.   Why: HHPT Contact information: [place]  [place] [phone]         Discharge Instructions    Diet - low sodium heart healthy   Complete by: As directed    Discharge instructions   Complete by: As directed    We will arrange for you to follow-up in the clinic in 1-2 weeks.   Do the following things EVERY DAY:  1. Weigh yourself EVERY morning after you go to the bathroom but before you eat or drink anything. Write this number down in a weight log/diary. If you gain 3 pounds overnight or are 5 pounds up from your normal, call the office as you will probably need an extra fluid pill.  2. Take your medicines as prescribed. If you have concerns about your medications, please call us before you stop taking them.   3. Eat low salt foods-Limit salt (sodium) to 2000 mg per day. This will help prevent your body from holding onto fluid. Read food labels as many processed foods have a  lot of sodium, especially canned goods and prepackaged meats. If you would like some assistance choosing low sodium foods, we would be happy to set you up with a nutritionist.  4. Stay as active as you can everyday. Staying active will give you more energy and make your muscles stronger. Start with 5 minutes at a time and work your way up to 30 minutes a day. Break up your activities--do some in the morning and some in the afternoon. Start with 3  days per week and work your way up to 5 days as you can.  If you have chest pain, feel short of breath, dizzy, or lightheaded, STOP. If you don't feel better after a short rest, call 911. If you do feel better, call the office to let us know you have symptoms with exercise.  5. Limit all fluids for the day to less than 2 liters. Fluid includes all drinks, coffee, juice, ice chips, soup, jello, and all other liquids.   Increase activity slowly   Complete by: As directed       Discharge Medications   Allergies as of 02/02/2020      Reactions   Ciprofloxacin Diarrhea   Sulfa Antibiotics Hives, Rash   Severe Rash   Succinylsulphathiazole Rash   Sulfamethoxazole Rash      Medication List    TAKE these medications   acetaminophen 500 MG tablet Commonly known as: TYLENOL Take 1,000 mg by mouth every 6 (six) hours as needed for mild pain or headache.   alendronate 70 MG tablet Commonly known as: FOSAMAX Take 70 mg by mouth every Monday.   atenolol 50 MG tablet Commonly known as: TENORMIN Take 1 tablet every morning What changed:   how much to take  how to take this  when to take this  additional instructions   atenolol 25 MG tablet Commonly known as: TENORMIN Take 1 tablet in the evening What changed:   how much to take  how to take this  when to take this  additional instructions   cholecalciferol 25 MCG (1000 UNIT) tablet Commonly known as: VITAMIN D3 Take 2,000 Units by mouth daily.   Derma-Smoothe/FS Scalp 0.01 %  Oil Generic drug: Fluocinolone Acetonide Scalp Apply 1 application topically See admin instructions. Apply to scalp after shower once a day   donepezil 10 MG tablet Commonly known as: ARICEPT Take 10 mg by mouth at bedtime.   Eliquis 2.5 MG Tabs tablet Generic drug: apixaban TAKE 1 TABLET TWICE A DAY What changed: how much to take   estradiol 0.5 MG tablet Commonly known as: ESTRACE Take 0.25 mg by mouth every other day.   fexofenadine 180 MG tablet Commonly known as: ALLEGRA Take 45 mg by mouth daily as needed for allergies or rhinitis (or seasonal allergies).   furosemide 40 MG tablet Commonly known as: LASIX Take 1 tablet (40 mg total) by mouth daily. What changed: medication strength   gabapentin 300 MG capsule Commonly known as: NEURONTIN Take 300 mg by mouth 3 (three) times daily.   halobetasol 0.05 % cream Commonly known as: ULTRAVATE Apply 1 application topically See admin instructions. APPLY A THIN LAYER TO RED, SCALY AREAS AS DIRECTED UP TO 3 TIMES A WEEK AS NEEDED, AS DIRECTED   ipratropium 0.06 % nasal spray Commonly known as: ATROVENT Can use two sprays in each nostril every six hours if needed to dry up runny nose. What changed:   how much to take  how to take this  when to take this  additional instructions   potassium chloride 10 MEQ tablet Commonly known as: KLOR-CON Take 20 mEq by mouth daily.   simvastatin 10 MG tablet Commonly known as: ZOCOR Take 10 mg by mouth at bedtime.   Vitamin B-12 5000 MCG Tbdp Take 5,000 mcg by mouth daily.          Outstanding Labs/Studies   None  Duration of Discharge Encounter   Greater than 30 minutes including physician time.  Signed, Will Heinkel  Harrell Gave, MD 02/03/2020, 7:43 AM

## 2020-02-08 ENCOUNTER — Other Ambulatory Visit: Payer: Medicare Other | Admitting: *Deleted

## 2020-02-08 ENCOUNTER — Other Ambulatory Visit: Payer: Self-pay

## 2020-02-08 DIAGNOSIS — R0600 Dyspnea, unspecified: Secondary | ICD-10-CM

## 2020-02-08 DIAGNOSIS — R0609 Other forms of dyspnea: Secondary | ICD-10-CM

## 2020-02-08 DIAGNOSIS — Z79899 Other long term (current) drug therapy: Secondary | ICD-10-CM

## 2020-02-08 DIAGNOSIS — I10 Essential (primary) hypertension: Secondary | ICD-10-CM

## 2020-02-09 ENCOUNTER — Telehealth: Payer: Self-pay | Admitting: Internal Medicine

## 2020-02-09 LAB — BASIC METABOLIC PANEL
BUN/Creatinine Ratio: 27 (ref 12–28)
BUN: 28 mg/dL — ABNORMAL HIGH (ref 8–27)
CO2: 21 mmol/L (ref 20–29)
Calcium: 9.5 mg/dL (ref 8.7–10.3)
Chloride: 99 mmol/L (ref 96–106)
Creatinine, Ser: 1.03 mg/dL — ABNORMAL HIGH (ref 0.57–1.00)
GFR calc Af Amer: 57 mL/min/{1.73_m2} — ABNORMAL LOW (ref >59)
GFR calc non Af Amer: 50 mL/min/{1.73_m2} — ABNORMAL LOW (ref >59)
Glucose: 78 mg/dL (ref 65–99)
Potassium: 4.5 mmol/L (ref 3.5–5.2)
Sodium: 139 mmol/L (ref 134–144)

## 2020-02-09 LAB — PRO B NATRIURETIC PEPTIDE: NT-Pro BNP: 1550 pg/mL — ABNORMAL HIGH (ref 0–738)

## 2020-02-09 NOTE — Telephone Encounter (Signed)

## 2020-02-12 NOTE — Progress Notes (Signed)
Virtual Visit via Telephone Note   This visit type was conducted due to national recommendations for restrictions regarding the COVID-19 Pandemic (e.g. social distancing) in an effort to limit this patient's exposure and mitigate transmission in our community.  Due to her co-morbid illnesses, this patient is at least at moderate risk for complications without adequate follow up.  This format is felt to be most appropriate for this patient at this time.  The patient did not have access to video technology/had technical difficulties with video requiring transitioning to audio format only (telephone).  All issues noted in this document were discussed and addressed.  No physical exam could be performed with this format.  Please refer to the patient's chart for her  consent to telehealth for [facility].   Date:  02/12/2020   ID:  [name] Dyer, DOB 1932-09-15, MRN [id number]  Patient Location: Home   PCP:  [name], MD  Cardiologist:  [name], MD  Electrophysiologist:  None   Evaluation Performed:  Follow-Up Visit  Chief Complaint:  F/U of diastolic CHF    History of Present Illness:    [name] Dyer is a 86 y.o. female with a hx of permanent afib, diastolic CHF , HTN   I saw her in clinic on 01/30/20  She was very SOB on 2/1 and I admitted her [facility]  She was diuresed there with IV lsix   Wt on day after d/c was 116 lbs  Since she left the hospital  she says that her ankles are skinny  Pt complains of pain in legs at night   DIdnt have that in hospital   Using Biofreeze  Pt denies edema.  No wheezing   Breathing better than before she went to hospital SInce discharge wt was 116    Wt went up to 118   Today wt is 117      The patient does not: have symptoms concerning for COVID-19 infection (fever, chills, cough, or new shortness of breath).    Past Medical History:  Diagnosis Date  . Anemia   . Arrhythmia   . Cataract   . Colon polyps   . Community acquired  pneumonia Feb 2013   LLL  . Congestive heart failure ([place])   . Hyperlipidemia   . Hypertension   . Memory difficulty 10/01/2016  . Osteoporosis   . Pleural effusion, right Feb 2013  . SVT (supraventricular tachycardia) ([place]) feb 2013  . UTI (lower urinary tract infection)    Past Surgical History:  Procedure Laterality Date  . ABDOMINAL HYSTERECTOMY  1983  . APPENDECTOMY  1983  . COLONOSCOPY    . COLONOSCOPY N/A 06/10/2016   Procedure: COLONOSCOPY;  Surgeon: Manus Gunning, MD;  Location: [facility];  Service: Gastroenterology;  Laterality: N/A;  . ESOPHAGOGASTRODUODENOSCOPY N/A 06/10/2016   Procedure: ESOPHAGOGASTRODUODENOSCOPY (EGD);  Surgeon: Manus Gunning, MD;  Location: [facility];  Service: Gastroenterology;  Laterality: N/A;  . HEMIARTHROPLASTY HIP  2011   Left femoral neck fracture  . UPPER GASTROINTESTINAL ENDOSCOPY       No outpatient medications have been marked as taking for the 02/13/20 encounter (Appointment) with [name], MD.     Allergies:   Ciprofloxacin, Sulfa antibiotics, Succinylsulphathiazole, and Sulfamethoxazole   Social History   Tobacco Use  . Smoking status: Never Smoker  . Smokeless tobacco: Never Used  Substance Use Topics  . Alcohol use: No  . Drug use: No     Family Hx:  The patient's family history includes Cancer in her grandchild; Colon polyps in her son; Stroke in her father.  ROS:   Please see the history of present illness.    All other systems reviewed and are negative.   Prior CV studies:   The following studies were reviewed today:  Echo 01/31/20 1. Left ventricular ejection fraction, by visual estimation, is 55 to  60%. The left ventricle has normal function. There is no left ventricular  hypertrophy.  2. Left ventricular diastolic function could not be evaluated due to  atrial fibrillation.  3. The left ventricle has no regional wall motion abnormalities.  4. Global right ventricle has  moderately reduced systolic function.The  right ventricular size is mildly enlarged. No increase in right  ventricular wall thickness.  5. Left atrial size was normal.  6. Right atrial size was severely dilated.  7. The mitral valve is normal in structure. Mild mitral valve prolapse of  the anterior leaflet. There is mild to moderate mitral valve regurgitation  eccentrically directed towards the lateral wall. No evidence of mitral  stenosis.  8. The tricuspid valve is normal in structure. Tricuspid valve  regurgitation is mild.  9. The aortic valve is tricuspid. Aortic valve regurgitation is trivial.  No evidence of aortic valve sclerosis or stenosis.  10. The pulmonic valve was normal in structure. Pulmonic valve  regurgitation is not visualized.  11. Mildly elevated pulmonary artery systolic pressure.  12. The inferior vena cava is dilated in size with <50% respiratory  variability, suggesting right atrial pressure of 15 mmHg.  13. Trivial pericardial effusion is present.  14. The pericardial effusion is posterior to the left ventricle.   Labs/Other Tests and Data Reviewed:    EKG:  No ECG reviewed.  Recent Labs: 01/30/2020: ALT 25; B Natriuretic Peptide 617.8; Hemoglobin 13.2; Platelets PLATELET CLUMPS NOTED ON SMEAR, UNABLE TO ESTIMATE; TSH 4.314 02/08/2020: BUN 28; Creatinine, Ser 1.03; NT-Pro BNP 1,550; Potassium 4.5; Sodium 139   Recent Lipid Panel Lab Results  Component Value Date/Time   CHOL 154 09/29/2019 11:52 AM   TRIG 61 09/29/2019 11:52 AM   HDL 53 09/29/2019 11:52 AM   CHOLHDL 2.9 09/29/2019 11:52 AM   LDLCALC 89 09/29/2019 11:52 AM    Wt Readings from Last 3 Encounters:  02/02/20 122 lb 9.6 oz (55.6 kg)  01/30/20 137 lb 3.2 oz (62.2 kg)  09/29/19 125 lb 9.6 oz (57 kg)     Objective:    Vital Signs:  There were no vitals taken for this visit.   VITAL SIGNS:  reviewed No vitals  ASSESSMENT & PLAN:    1. Chronic diastolic  CHF  Patient just  discharged earlier this month after admission for acute diastolic CHF    Increase lasix tomorrow to 3 tabs then back to regular 2 tabs (40 mg total)   Watch / limt salt   Fluid to 1.5 l per day   Will call pt later this week   2.  Permanent afib  COntinue on reate control     3.  HTN   Willcontinue to follow on current regimen   4  LE discomfort    Pt says her legs did not hurt when she was laying in bed in the hosp.  Now, when she is up more, her legs hurt   [place]   Follow symptoms later this week.    COVID-19 Education: The signs and symptoms of COVID-19 were discussed with the patient and how to  seek care for testing (follow up with PCP or arrange E-visit).  The importance of social distancing was discussed today.  Time:   Today, I have spent 20  minutes with the patient with telehealth technology discussing the above problems.     Medication Adjustments/Labs and Tests Ordered: Current medicines are reviewed at length with the patient today.  Concerns regarding medicines are outlined above.   Tests Ordered: No orders of the defined types were placed in this encounter.   Medication Changes: No orders of the defined types were placed in this encounter.   Follow Up:    Signed, [name], MD  02/12/2020 10:49 PM    [name]

## 2020-02-13 ENCOUNTER — Encounter: Payer: Self-pay | Admitting: Internal Medicine

## 2020-02-13 ENCOUNTER — Telehealth (INDEPENDENT_AMBULATORY_CARE_PROVIDER_SITE_OTHER): Payer: Medicare Other | Admitting: Internal Medicine

## 2020-02-13 ENCOUNTER — Telehealth: Payer: Self-pay | Admitting: Nurse Practitioner

## 2020-02-13 DIAGNOSIS — I110 Hypertensive heart disease with heart failure: Secondary | ICD-10-CM

## 2020-02-13 DIAGNOSIS — I5032 Chronic diastolic (congestive) heart failure: Secondary | ICD-10-CM | POA: Diagnosis not present

## 2020-02-13 DIAGNOSIS — I4821 Permanent atrial fibrillation: Secondary | ICD-10-CM

## 2020-02-13 NOTE — Telephone Encounter (Signed)
Reviewed lab results and plan of care with patient. She was confused because the instructions from Dr. Harrington Challenger on the lab results were slightly different than what Dr. Harrington Challenger told her this morning. I reviewed Dr. Harrington Challenger' note from the virtual visit this morning along with the lab results and advised patient to take lasix 60 mg tomorrow and then continue to take 60 mg every 3 days. On the other days, I advised her to take lasix 40 mg. She is scheduled to see Robbie Lis, PA on 3/5 and can have lab work on that day. Patient asks what she should monitor for and I advised her to call if weight increases 3 lb in a day or 5 lbs in a week or if she is losing weight or having dizziness, lightheadedness, or fatigue. I answered questions to patient's satisfaction and she states she would like Michalene, RN to call and check on her in about a week. I advised that I will forward the message to [facility] and she thanked me for the call.

## 2020-02-13 NOTE — Telephone Encounter (Signed)
-----  Message from [name], MD sent at 02/12/2020 10:17 PM EST ----- Electrolytes and kidney function are normal Fluid is up but better than previous    Still up I would increase lasix to 60 mg 2x per week  (about every 3rd day)    F/U BMET and BNP in 3 wks

## 2020-02-14 ENCOUNTER — Ambulatory Visit: Payer: Medicare Other | Admitting: Physician Assistant

## 2020-02-17 ENCOUNTER — Telehealth: Payer: Self-pay | Admitting: Internal Medicine

## 2020-02-17 NOTE — Telephone Encounter (Signed)
Spoke to patient    She thinks discomfort in legs is neuropathy Wt is still in 117s  Recomm   Take 40 mg lasix daily   If wt goes up 2 lbs or she gets more swellig in legs then take 3 lasix (60)   She could alternate or every 3rd day if goes up    [name] MD

## 2020-02-20 NOTE — Telephone Encounter (Signed)
Spoke with patient.  She is feeling pretty well at home.  Doing home PT.  Aware of her appointment with VB on 3/5 21 and continues with lasix 60 mg every 3rd day and 40 mg all the other days.

## 2020-02-27 ENCOUNTER — Other Ambulatory Visit: Payer: Self-pay | Admitting: Internal Medicine

## 2020-02-27 ENCOUNTER — Other Ambulatory Visit: Payer: Self-pay

## 2020-02-27 MED ORDER — FUROSEMIDE 40 MG PO TABS: 40.0000 mg | ORAL_TABLET | Freq: Every day | ORAL | 0 refills | Status: DC

## 2020-02-27 NOTE — Telephone Encounter (Signed)
Refill came in through fax with a comment from the pharmacy, "patient requests new Rx: says her dose of furosemide has changed, please send in new RX."

## 2020-02-29 NOTE — Progress Notes (Signed)
Cardiology Office Note    Date:  03/02/2020   ID:  [name] Dyer, DOB 1932/05/23, MRN [id number]  PCP:  [name], MD  Cardiologist:  Dr. Harrington Challenger  Chief Complaint: 3 weeks follow up  History of Present Illness:   [name] Dyer is a 86 y.o. female with a history of permanent atrial fibrillation, chronic diastolic heart failure, mitral regurgitation,  hypertension and HLD seen for follow up.  Admitted with acute on chronic diastolic heart failure February 2021 after failing outpatient diuresis.  Echocardiogram during admission showed LV function of 55 to 60%, could not evaluated diastolic function secondary to atrial fibrillation, mild to moderate mitral regurgitation. Discharge weight 55.6kg.  Seen virtually by Dr. Harrington Challenger 02/13/20. Had leg discomfort.   Here today for follow up. Her weight is stable at 116-118lb. She has leg discomfort, mostly at night secondary to neuropathy.  She takes gabapentin.  Denies chest pain, dizziness, orthopnea, PND, lower extremity edema or shortness of breath.    Past Medical History:  Diagnosis Date  . Anemia   . Arrhythmia   . Cataract   . Colon polyps   . Community acquired pneumonia Feb 2013   LLL  . Congestive heart failure ([place])   . Hyperlipidemia   . Hypertension   . Memory difficulty 10/01/2016  . Osteoporosis   . Pleural effusion, right Feb 2013  . SVT (supraventricular tachycardia) ([place]) feb 2013  . UTI (lower urinary tract infection)     Past Surgical History:  Procedure Laterality Date  . ABDOMINAL HYSTERECTOMY  1983  . APPENDECTOMY  1983  . COLONOSCOPY    . COLONOSCOPY N/A 06/10/2016   Procedure: COLONOSCOPY;  Surgeon: Manus Gunning, MD;  Location: [facility];  Service: Gastroenterology;  Laterality: N/A;  . ESOPHAGOGASTRODUODENOSCOPY N/A 06/10/2016   Procedure: ESOPHAGOGASTRODUODENOSCOPY (EGD);  Surgeon: Manus Gunning, MD;  Location: [facility];  Service: Gastroenterology;  Laterality: N/A;  .  HEMIARTHROPLASTY HIP  2011   Left femoral neck fracture  . UPPER GASTROINTESTINAL ENDOSCOPY      Current Medications: Prior to Admission medications   Medication Sig Start Date End Date Taking? Authorizing Provider  acetaminophen (TYLENOL) 500 MG tablet Take 1,000 mg by mouth every 6 (six) hours as needed for mild pain or headache.     [provider]  alendronate (FOSAMAX) 70 MG tablet Take 70 mg by mouth every Monday.  09/30/16   [provider]  atenolol (TENORMIN) 25 MG tablet Take 1 tablet in the evening Patient taking differently: Take 25 mg by mouth every evening.  08/11/19   [name], MD  atenolol (TENORMIN) 50 MG tablet Take 1 tablet every morning Patient taking differently: Take 50 mg by mouth every morning.  08/11/19   [name], MD  cholecalciferol (VITAMIN D3) 25 MCG (1000 UT) tablet Take 2,000 Units by mouth daily.    [provider]  Cyanocobalamin (VITAMIN B-12) 5000 MCG TBDP Take 5,000 mcg by mouth daily.     [provider]  donepezil (ARICEPT) 10 MG tablet Take 10 mg by mouth at bedtime.    [provider]  ELIQUIS 2.5 MG TABS tablet TAKE 1 TABLET TWICE A DAY Patient taking differently: Take 2.5 mg by mouth 2 (two) times daily.  11/16/19   [name], MD  estradiol (ESTRACE) 0.5 MG tablet Take 0.25 mg by mouth every other day.  02/26/18   [provider]  Fluocinolone Acetonide Scalp (DERMA-SMOOTHE/FS SCALP) 0.01 % OIL Apply  1 application topically See admin instructions. Apply to scalp after shower once a day    [provider]  furosemide (LASIX) 40 MG tablet Take 1 tablet (40 mg total) by mouth daily. If weight increase by 2 pounds overnight or leg swelling increase- take 1.5 tablets (60 mg) 02/27/20 03/28/20  [name], MD  gabapentin (NEURONTIN) 300 MG capsule Take 300 mg by mouth 3 (three) times daily.    [provider]  halobetasol (ULTRAVATE) 0.05 % cream Apply 1 application topically  See admin instructions. APPLY A THIN LAYER TO RED, SCALY AREAS AS DIRECTED UP TO 3 TIMES A WEEK AS NEEDED, AS DIRECTED    [provider]  ipratropium (ATROVENT) 0.06 % nasal spray Can use two sprays in each nostril every six hours if needed to dry up runny nose. Patient taking differently: Place 2 sprays into both nostrils 2 (two) times daily.  10/18/19   Kennith Gain, MD  Menthol, Topical Analgesic, (BIOFREEZE EX) Apply topically.    [provider]  potassium chloride (KLOR-CON) 10 MEQ tablet Take 20 mEq by mouth daily.    [provider]  simvastatin (ZOCOR) 10 MG tablet Take 10 mg by mouth at bedtime.  02/15/18   [provider]    Allergies:   Ciprofloxacin, Sulfa antibiotics, Succinylsulphathiazole, and Sulfamethoxazole   Social History   Socioeconomic History  . Marital status: Single    Spouse name: Not on file  . Number of children: 2  . Years of education: 41  . Highest education level: Not on file  Occupational History  . Occupation: Retired  Tobacco Use  . Smoking status: Never Smoker  . Smokeless tobacco: Never Used  Substance and Sexual Activity  . Alcohol use: No  . Drug use: No  . Sexual activity: Not on file  Other Topics Concern  . Not on file  Social History Narrative   Married, retired, 2 sons. Husband is disabled, non-ambulatory. Spends the the winters in [place].   Right-handed   Caffeine: Cheerwine or hot chocolate occasionally but otherwise none   Social Determinants of Radio broadcast assistant Strain:   . Difficulty of Paying Living Expenses: Not on file  Food Insecurity:   . Worried About Charity fundraiser in the Last Year: Not on file  . Ran Out of Food in the Last Year: Not on file  Transportation Needs:   . Lack of Transportation (Medical): Not on file  . Lack of Transportation (Non-Medical): Not on file  Physical Activity:   . Days of Exercise per Week: Not on file  . Minutes of Exercise  per Session: Not on file  Stress:   . Feeling of Stress : Not on file  Social Connections:   . Frequency of Communication with Friends and Family: Not on file  . Frequency of Social Gatherings with Friends and Family: Not on file  . Attends Religious Services: Not on file  . Active Member of Clubs or Organizations: Not on file  . Attends Archivist Meetings: Not on file  . Marital Status: Not on file     Family History:  The patient's family history includes Cancer in her grandchild; Colon polyps in her son; Stroke in her father.   ROS:   Please see the history of present illness.    ROS All other systems reviewed and are negative.   PHYSICAL EXAM:   VS:  BP (!) 126/58   Pulse 72  Ht 5' (1.524 m)   Wt 119 lb 12 oz (54.3 kg)   SpO2 98%   BMI 23.39 kg/m    GEN: Well nourished, well developed, in no acute distress  HEENT: normal  Neck: no JVD, carotid bruits, or masses Cardiac: Irregularly irregular; no murmurs, rubs, or gallops,no edema  Respiratory:  clear to auscultation bilaterally, normal work of breathing GI: soft, nontender, nondistended, + BS MS: no deformity or atrophy  Skin: warm and dry, no rash Neuro:  Alert and Oriented x 3, Strength and sensation are intact Psych: euthymic mood, full affect  Wt Readings from Last 3 Encounters:  03/02/20 119 lb 12 oz (54.3 kg)  02/13/20 117 lb (53.1 kg)  02/02/20 122 lb 9.6 oz (55.6 kg)      Studies/Labs Reviewed:   EKG:  EKG is not  ordered today.    Recent Labs: 01/30/2020: ALT 25; B Natriuretic Peptide 617.8; Hemoglobin 13.2; Platelets PLATELET CLUMPS NOTED ON SMEAR, UNABLE TO ESTIMATE; TSH 4.314 02/08/2020: BUN 28; Creatinine, Ser 1.03; NT-Pro BNP 1,550; Potassium 4.5; Sodium 139   Lipid Panel    Component Value Date/Time   CHOL 154 09/29/2019 1152   TRIG 61 09/29/2019 1152   HDL 53 09/29/2019 1152   CHOLHDL 2.9 09/29/2019 1152   LDLCALC 89 09/29/2019 1152    Additional studies/ records that were  reviewed today include:   Echo 01/31/20 1. Left ventricular ejection fraction, by visual estimation, is 55 to  60%. The left ventricle has normal function. There is no left ventricular  hypertrophy.  2. Left ventricular diastolic function could not be evaluated due to  atrial fibrillation.  3. The left ventricle has no regional wall motion abnormalities.  4. Global right ventricle has moderately reduced systolic function.The  right ventricular size is mildly enlarged. No increase in right  ventricular wall thickness.  5. Left atrial size was normal.  6. Right atrial size was severely dilated.  7. The mitral valve is normal in structure. Mild mitral valve prolapse of  the anterior leaflet. There is mild to moderate mitral valve regurgitation  eccentrically directed towards the lateral wall. No evidence of mitral  stenosis.  8. The tricuspid valve is normal in structure. Tricuspid valve  regurgitation is mild.  9. The aortic valve is tricuspid. Aortic valve regurgitation is trivial.  No evidence of aortic valve sclerosis or stenosis.  10. The pulmonic valve was normal in structure. Pulmonic valve  regurgitation is not visualized.  11. Mildly elevated pulmonary artery systolic pressure.  12. The inferior vena cava is dilated in size with <50% respiratory  variability, suggesting right atrial pressure of 15 mmHg.  13. Trivial pericardial effusion is present.  14. The pericardial effusion is posterior to the left ventricle.     ASSESSMENT & PLAN:    1. Chronic diastolic CHF -Weight and symptoms stable.  Continue Lasix 40 mg daily.  She takes an additional 20 mg every third or fourth day.   2. mitral valve prolapse/ mild to moderate mitral valve regurgitation  -Check with yearly echo   3. Permanent atrial fibrillation  - CHADVASCs score of 5 -Continue Eliquis. -No bleeding issue  4. HTN -Blood pressure stable on current medication.  No change.  Medication  Adjustments/Labs and Tests Ordered: Current medicines are reviewed at length with the patient today.  Concerns regarding medicines are outlined above.  Medication changes, Labs and Tests ordered today are listed in the Patient Instructions below. Patient Instructions  Medication Instructions:   Your  physician recommends that you continue on your current medications as directed. Please refer to the Current Medication list given to you today.   *If you need a refill on your cardiac medications before your next appointment, please call your pharmacy*  Lab Work: [place]   If you have labs (blood work) drawn today and your tests are completely normal, you will receive your results only by: Marland Kitchen MyChart Message (if you have MyChart) OR . A paper copy in the mail If you have any lab test that is abnormal or we need to change your treatment, we will call you to review the results.   Testing/Procedures: NONE ORDERED  TODAY  Follow-Up: At [facility], you and your health needs are our priority.  As part of our continuing mission to provide you with exceptional heart care, we have created designated Provider Care Teams.  These Care Teams include your primary Cardiologist (physician) and Advanced Practice Providers (APPs -  Physician Assistants and Nurse Practitioners) who all work together to provide you with the care you need, when you need it.  We recommend signing up for the patient portal called "MyChart".  Sign up information is provided on this After Visit Summary.  MyChart is used to connect with patients for Virtual Visits (Telemedicine).  Patients are able to view lab/test results, encounter notes, upcoming appointments, etc.  Non-urgent messages can be sent to your provider as well.   To learn more about what you can do with MyChart, go to [email or web address].    Your next appointment:   3-4  month(s)  The format for your next appointment:   In Person  Provider:   You  may see [name], MD or one of the following Advanced Practice Providers on your designated Care Team:    Richardson Dopp, PA-C  [place], [place]  Daune Perch, NP    Other Instructions      Weston Brass Leanor Kail, [place]  03/02/2020 12:59 PM    [address], [place], [place]  [address] Phone: [phone]; Fax: [phone]

## 2020-03-02 ENCOUNTER — Other Ambulatory Visit: Payer: Self-pay

## 2020-03-02 ENCOUNTER — Encounter: Payer: Self-pay | Admitting: Physician Assistant

## 2020-03-02 ENCOUNTER — Ambulatory Visit (INDEPENDENT_AMBULATORY_CARE_PROVIDER_SITE_OTHER): Payer: Medicare Other | Admitting: Physician Assistant

## 2020-03-02 VITALS — BP 126/58 | HR 72 | Ht 60.0 in | Wt 119.8 lb

## 2020-03-02 DIAGNOSIS — I4821 Permanent atrial fibrillation: Secondary | ICD-10-CM

## 2020-03-02 DIAGNOSIS — I340 Nonrheumatic mitral (valve) insufficiency: Secondary | ICD-10-CM

## 2020-03-02 DIAGNOSIS — I5032 Chronic diastolic (congestive) heart failure: Secondary | ICD-10-CM

## 2020-03-02 DIAGNOSIS — I10 Essential (primary) hypertension: Secondary | ICD-10-CM | POA: Diagnosis not present

## 2020-03-02 NOTE — Patient Instructions (Addendum)
Medication Instructions:   Your physician recommends that you continue on your current medications as directed. Please refer to the Current Medication list given to you today.   *If you need a refill on your cardiac medications before your next appointment, please call your pharmacy*  Lab Work: [place]   If you have labs (blood work) drawn today and your tests are completely normal, you will receive your results only by: Marland Kitchen MyChart Message (if you have MyChart) OR . A paper copy in the mail If you have any lab test that is abnormal or we need to change your treatment, we will call you to review the results.   Testing/Procedures: NONE ORDERED  TODAY  Follow-Up: At [facility], you and your health needs are our priority.  As part of our continuing mission to provide you with exceptional heart care, we have created designated Provider Care Teams.  These Care Teams include your primary Cardiologist (physician) and Advanced Practice Providers (APPs -  Physician Assistants and Nurse Practitioners) who all work together to provide you with the care you need, when you need it.  We recommend signing up for the patient portal called "MyChart".  Sign up information is provided on this After Visit Summary.  MyChart is used to connect with patients for Virtual Visits (Telemedicine).  Patients are able to view lab/test results, encounter notes, upcoming appointments, etc.  Non-urgent messages can be sent to your provider as well.   To learn more about what you can do with MyChart, go to [email or web address].    Your next appointment:   3-4  month(s)  The format for your next appointment:   In Person  Provider:   You may see [name], MD or one of the following Advanced Practice Providers on your designated Care Team:    Richardson Dopp, PA-C  [place], [place]  Daune Perch, NP    Other Instructions

## 2020-03-14 ENCOUNTER — Other Ambulatory Visit: Payer: Self-pay | Admitting: Internal Medicine

## 2020-03-14 MED ORDER — FUROSEMIDE 20 MG PO TABS: 20.0000 mg | ORAL_TABLET | ORAL | 6 refills | Status: DC

## 2020-03-14 MED ORDER — POTASSIUM CHLORIDE CRYS ER 10 MEQ PO TBCR: 20.0000 meq | EXTENDED_RELEASE_TABLET | Freq: Every day | ORAL | 3 refills | Status: DC

## 2020-03-14 MED ORDER — FUROSEMIDE 40 MG PO TABS: 40.0000 mg | ORAL_TABLET | Freq: Every day | ORAL | 11 refills | Status: DC

## 2020-03-14 NOTE — Telephone Encounter (Signed)
[name] medications were sent to pt's pharmacy as requested. Confirmation received.  

## 2020-03-15 ENCOUNTER — Other Ambulatory Visit: Payer: Self-pay | Admitting: *Deleted

## 2020-03-15 MED ORDER — HALOBETASOL PROPIONATE 0.05 % EX CREA: TOPICAL_CREAM | CUTANEOUS | 2 refills | Status: DC

## 2020-03-15 MED ORDER — FLUOCINOLONE ACETONIDE SCALP 0.01 % EX OIL: TOPICAL_OIL | CUTANEOUS | 2 refills | Status: DC

## 2020-03-16 ENCOUNTER — Other Ambulatory Visit: Payer: Self-pay | Admitting: Internal Medicine

## 2020-03-16 ENCOUNTER — Other Ambulatory Visit: Payer: Self-pay | Admitting: *Deleted

## 2020-03-16 MED ORDER — FLUOCINOLONE ACETONIDE SCALP 0.01 % EX OIL: TOPICAL_OIL | CUTANEOUS | 2 refills | Status: DC

## 2020-03-16 MED ORDER — APIXABAN 2.5 MG PO TABS: 2.5000 mg | ORAL_TABLET | Freq: Two times a day (BID) | ORAL | 1 refills | Status: DC

## 2020-03-16 MED ORDER — HALOBETASOL PROPIONATE 0.05 % EX CREA: TOPICAL_CREAM | CUTANEOUS | 2 refills | Status: DC

## 2020-03-16 NOTE — Telephone Encounter (Signed)
*  STAT* If patient is at the pharmacy, call can be transferred to refill team.   1. Which medications need to be refilled? (please list name of each medication and dose if known)  Eliquis  2. Which pharmacy/location (including street and city if local pharmacy) is medication to be sent to? [facility] [place]  3. Do they need a 30 day or 90 day supply? #60 and refills

## 2020-03-16 NOTE — Telephone Encounter (Signed)
Pt last saw Robbie Lis, PA on 03/02/20, last labs Creat 1.03, age 86, weight 54.3kg, based on specified criteria pt is on appropriate dosage of Eliquis 2.50m BID.  Will refill rx.

## 2020-03-21 ENCOUNTER — Ambulatory Visit: Payer: Medicare Other | Admitting: Allergy and Immunology

## 2020-03-23 ENCOUNTER — Other Ambulatory Visit: Payer: Self-pay | Admitting: Internal Medicine

## 2020-03-23 MED ORDER — APIXABAN 2.5 MG PO TABS: 2.5000 mg | ORAL_TABLET | Freq: Two times a day (BID) | ORAL | 1 refills | Status: DC

## 2020-03-23 NOTE — Telephone Encounter (Signed)
New Message   *STAT* If patient is at the pharmacy, call can be transferred to refill team.   1. Which medications need to be refilled? (please list name of each medication and dose if known) apixaban (ELIQUIS) 2.5 MG TABS tablet  2. Which pharmacy/location (including street and city if local pharmacy) is medication to be sent to? [place], [place]  3. Do they need a 30 day or 90 day supply? 90 day

## 2020-03-23 NOTE — Telephone Encounter (Signed)
[facility]

## 2020-03-26 ENCOUNTER — Telehealth: Payer: Self-pay | Admitting: Allergy and Immunology

## 2020-03-26 ENCOUNTER — Other Ambulatory Visit: Payer: Self-pay

## 2020-03-26 MED ORDER — IPRATROPIUM BROMIDE 0.06 % NA SOLN: NASAL | 2 refills | Status: DC

## 2020-03-26 NOTE — Telephone Encounter (Signed)
[name] Dyer would like ATROVENT sent to [facility] in [facility].  [name] states the prescription in expired.

## 2020-03-26 NOTE — Telephone Encounter (Signed)
[place] sent to [facility] as requested.

## 2020-04-26 DIAGNOSIS — M79675 Pain in left toe(s): Secondary | ICD-10-CM | POA: Insufficient documentation

## 2020-05-15 ENCOUNTER — Telehealth: Payer: Self-pay | Admitting: *Deleted

## 2020-05-15 NOTE — Telephone Encounter (Signed)
Unable to leave a message answering service stated this phone number is no longer in service try again later if you feel you have gotten this message in error. I called again and the message stated the prescriber was not accepting calls at this time.

## 2020-05-15 NOTE — Telephone Encounter (Signed)
[name] son, [name] called asked if our office would see pt for blackened toenail and inflamed toe

## 2020-05-19 ENCOUNTER — Other Ambulatory Visit: Payer: Self-pay | Admitting: Internal Medicine

## 2020-05-22 ENCOUNTER — Other Ambulatory Visit: Payer: Self-pay

## 2020-05-22 ENCOUNTER — Ambulatory Visit (INDEPENDENT_AMBULATORY_CARE_PROVIDER_SITE_OTHER): Payer: Medicare Other | Admitting: Podiatry

## 2020-05-22 ENCOUNTER — Encounter: Payer: Self-pay | Admitting: Podiatry

## 2020-05-22 DIAGNOSIS — N3000 Acute cystitis without hematuria: Secondary | ICD-10-CM | POA: Insufficient documentation

## 2020-05-22 DIAGNOSIS — B351 Tinea unguium: Secondary | ICD-10-CM | POA: Diagnosis not present

## 2020-05-22 DIAGNOSIS — M79676 Pain in unspecified toe(s): Secondary | ICD-10-CM | POA: Diagnosis not present

## 2020-05-22 DIAGNOSIS — G8929 Other chronic pain: Secondary | ICD-10-CM | POA: Insufficient documentation

## 2020-05-22 DIAGNOSIS — N8110 Cystocele, unspecified: Secondary | ICD-10-CM | POA: Insufficient documentation

## 2020-05-23 NOTE — Progress Notes (Signed)
Subjective:  Patient ID: [name] Dyer, female    DOB: 1932/07/20,  MRN: [id number] HPI Chief Complaint  Patient presents with  . Nail Problem    Hallux left - toenail is darkened at the base, nail is thick, some tenderness, doesn't remember an injury  . New Patient (Initial Visit)    86 y.o. female presents with the above complaint.   ROS: Denies fever chills nausea vomiting muscle aches pains calf pain back pain chest pain shortness of breath.  Past Medical History:  Diagnosis Date  . Anemia   . Arrhythmia   . Cataract   . Colon polyps   . Community acquired pneumonia Feb 2013   LLL  . Congestive heart failure ([place])   . Hyperlipidemia   . Hypertension   . Memory difficulty 10/01/2016  . Osteoporosis   . Pleural effusion, right Feb 2013  . SVT (supraventricular tachycardia) ([place]) feb 2013  . UTI (lower urinary tract infection)    Past Surgical History:  Procedure Laterality Date  . ABDOMINAL HYSTERECTOMY  1983  . APPENDECTOMY  1983  . COLONOSCOPY    . COLONOSCOPY N/A 06/10/2016   Procedure: COLONOSCOPY;  Surgeon: Manus Gunning, MD;  Location: [facility];  Service: Gastroenterology;  Laterality: N/A;  . ESOPHAGOGASTRODUODENOSCOPY N/A 06/10/2016   Procedure: ESOPHAGOGASTRODUODENOSCOPY (EGD);  Surgeon: Manus Gunning, MD;  Location: [facility];  Service: Gastroenterology;  Laterality: N/A;  . HEMIARTHROPLASTY HIP  2011   Left femoral neck fracture  . UPPER GASTROINTESTINAL ENDOSCOPY      Current Outpatient Medications:  .  acetaminophen (TYLENOL) 500 MG tablet, Take 1,000 mg by mouth every 6 (six) hours as needed for mild pain or headache. , Disp: , Rfl:  .  alendronate (FOSAMAX) 70 MG tablet, Take 70 mg by mouth every Monday. , Disp: , Rfl:  .  apixaban (ELIQUIS) 2.5 MG TABS tablet, Take 1 tablet (2.5 mg total) by mouth 2 (two) times daily., Disp: 180 tablet, Rfl: 1 .  atenolol (TENORMIN) 25 MG tablet, Take 1 tablet in the evening, Disp: 90  tablet, Rfl: 3 .  atenolol (TENORMIN) 50 MG tablet, Take 1 tablet every morning, Disp: 90 tablet, Rfl: 3 .  cholecalciferol (VITAMIN D3) 25 MCG (1000 UT) tablet, Take 2,000 Units by mouth daily., Disp: , Rfl:  .  Cyanocobalamin (VITAMIN B-12) 5000 MCG TBDP, Take 5,000 mcg by mouth daily. , Disp: , Rfl:  .  donepezil (ARICEPT) 10 MG tablet, Take 10 mg by mouth at bedtime., Disp: , Rfl:  .  estradiol (ESTRACE) 0.5 MG tablet, Take 0.25 mg by mouth every other day. , Disp: , Rfl:  .  Fluocinolone Acetonide Scalp (DERMA-SMOOTHE/FS SCALP) 0.01 % OIL, Apply to scalp after shower once daily one-seven times weekly, Disp: 118.28 mL, Rfl: 2 .  furosemide (LASIX) 20 MG tablet, Take 1 tablet (20 mg total) by mouth as directed. daily, Disp: 30 tablet, Rfl: 6 .  furosemide (LASIX) 40 MG tablet, Take 1 tablet (40 mg total) by mouth daily. If weight increase by 2 pounds overnight or leg swelling increase- take 1.5 tablets (60 mg), Disp: 30 tablet, Rfl: 11 .  gabapentin (NEURONTIN) 300 MG capsule, Take 300 mg by mouth 3 (three) times daily., Disp: , Rfl:  .  halobetasol (ULTRAVATE) 0.05 % cream, APPLY A THIN LAYER TO RED, SCALY AREAS AS DIRECTED UP TO 3 TIMES A WEEK AS NEEDED, AS DIRECTED, Disp: 50 g, Rfl: 2 .  ipratropium (ATROVENT) 0.06 % nasal spray, Can use  two sprays in each nostril every six hours if needed to dry up runny nose., Disp: 15 mL, Rfl: 2 .  Menthol, Topical Analgesic, (BIOFREEZE EX), Apply topically., Disp: , Rfl:  .  mupirocin ointment (BACTROBAN) 2 %, Apply 3 application topically as needed., Disp: , Rfl:  .  potassium chloride (KLOR-CON) 10 MEQ tablet, Take 2 tablets (20 mEq total) by mouth daily., Disp: 180 tablet, Rfl: 3 .  potassium chloride (KLOR-CON) 10 MEQ tablet, TAKE 1 TABLET BY MOUTH 2 TIMES DAILY, Disp: 180 tablet, Rfl: 3 .  simvastatin (ZOCOR) 10 MG tablet, Take 10 mg by mouth at bedtime. , Disp: , Rfl:   Allergies  Allergen Reactions  . Ciprofloxacin Diarrhea  . Sulfa Antibiotics  Hives and Rash    Severe Rash  . Succinylsulphathiazole Rash  . Sulfamethoxazole Rash   Review of Systems Objective:  There were no vitals filed for this visit.  General: Well developed, nourished, in no acute distress, alert and oriented x3   Dermatological: Skin is warm, dry and supple bilateral. Nails x 10 are well maintained hallux left demonstrates thickening with subungual bleeding appears to have dried does not demonstrate any signs of infection most likely dystrophy.; remaining integument appears unremarkable at this time. There are no open sores, no preulcerative lesions, no rash or signs of infection present.  Vascular: Dorsalis Pedis artery and Posterior Tibial artery pedal pulses are 2/4 bilateral with immedate capillary fill time. Pedal hair growth present. No varicosities and no lower extremity edema present bilateral.   Neruologic: Grossly intact via light touch bilateral. Vibratory intact via tuning fork bilateral. Protective threshold with Semmes Wienstein monofilament intact to all pedal sites bilateral. Patellar and Achilles deep tendon reflexes 2+ bilateral. No Babinski or clonus noted bilateral.   Musculoskeletal: No gross boney pedal deformities bilateral. No pain, crepitus, or limitation noted with foot and ankle range of motion bilateral. Muscular strength 5/5 in all groups tested bilateral.  Gait: Unassisted, Nonantalgic.    Radiographs:  None taken  Assessment & Plan:   Assessment: Nail dystrophy hallux left  Plan: Sharply debrided the nail today without pain or bleeding.  She tolerated procedure well.  Follow-up with her as needed.     Jaysha Lasure T. [place], [place]

## 2020-05-29 ENCOUNTER — Other Ambulatory Visit: Payer: Self-pay | Admitting: Internal Medicine

## 2020-05-30 MED ORDER — FUROSEMIDE 20 MG PO TABS: 20.0000 mg | ORAL_TABLET | ORAL | 9 refills | Status: DC

## 2020-05-30 MED ORDER — FUROSEMIDE 40 MG PO TABS: 40.0000 mg | ORAL_TABLET | Freq: Every day | ORAL | 9 refills | Status: DC

## 2020-06-07 ENCOUNTER — Ambulatory Visit: Payer: Medicare Other | Admitting: Physician Assistant

## 2020-06-13 NOTE — Progress Notes (Deleted)
CARDIOLOGY OFFICE NOTE  Date:  06/20/2020    [name] Dyer Date of Birth: 1932-09-26 Medical Record #[id number]  PCP:  [name], MD  Cardiologist:  Harrington Challenger  Chief Complaint  Patient presents with  . Follow-up    Seen for Dr. Harrington Challenger    History of Present Illness: [name] Dyer is a 86 y.o. female who presents today for a follow up visit. Seen for Dr. Harrington Challenger.   She has a history of persistent AF, chronic diastolic HF, MR, HTN and HLD.   Admitted with acute on chronic diastolic heart failure February 2021 after failing outpatient diuresis. Echocardiogram during admission showed LV function of 55 to 60%, could not evaluate diastolic function secondary to atrial fibrillation, mild to moderate mitral regurgitation. Discharge weight 55.6kg.  Seen virtually by Dr. Harrington Challenger 02/13/20. Last seen by Vin for in office visit back in early March - weight was stable. More troubled with her neuropathy.   The patient does not have symptoms concerning for COVID-19 infection (fever, chills, cough, or new shortness of breath).   Comes in today. Here alone.   Past Medical History:  Diagnosis Date  . Anemia   . Arrhythmia   . Cataract   . Colon polyps   . Community acquired pneumonia Feb 2013   LLL  . Congestive heart failure ([place])   . Hyperlipidemia   . Hypertension   . Memory difficulty 10/01/2016  . Osteoporosis   . Pleural effusion, right Feb 2013  . SVT (supraventricular tachycardia) ([place]) feb 2013  . UTI (lower urinary tract infection)     Past Surgical History:  Procedure Laterality Date  . ABDOMINAL HYSTERECTOMY  1983  . APPENDECTOMY  1983  . COLONOSCOPY    . COLONOSCOPY N/A 06/10/2016   Procedure: COLONOSCOPY;  Surgeon: Manus Gunning, MD;  Location: [facility];  Service: Gastroenterology;  Laterality: N/A;  . ESOPHAGOGASTRODUODENOSCOPY N/A 06/10/2016   Procedure: ESOPHAGOGASTRODUODENOSCOPY (EGD);  Surgeon: Manus Gunning, MD;  Location: [facility];  Service: Gastroenterology;  Laterality: N/A;  . HEMIARTHROPLASTY HIP  2011   Left femoral neck fracture  . UPPER GASTROINTESTINAL ENDOSCOPY       Medications: Current Meds  Medication Sig  . acetaminophen (TYLENOL) 500 MG tablet Take 1,000 mg by mouth every 6 (six) hours as needed for mild pain or headache.   . alendronate (FOSAMAX) 70 MG tablet Take 70 mg by mouth every Monday.   Marland Kitchen apixaban (ELIQUIS) 2.5 MG TABS tablet Take 1 tablet (2.5 mg total) by mouth 2 (two) times daily.  Marland Kitchen atenolol (TENORMIN) 25 MG tablet Take 1 tablet in the evening  . atenolol (TENORMIN) 50 MG tablet Take 1 tablet every morning  . cholecalciferol (VITAMIN D3) 25 MCG (1000 UT) tablet Take 2,000 Units by mouth daily.  . Cyanocobalamin (VITAMIN B-12) 5000 MCG TBDP Take 5,000 mcg by mouth daily.   Marland Kitchen donepezil (ARICEPT) 10 MG tablet Take 10 mg by mouth at bedtime.  Marland Kitchen estradiol (ESTRACE) 0.5 MG tablet Take 0.25 mg by mouth every other day.   . Fluocinolone Acetonide Scalp (DERMA-SMOOTHE/FS SCALP) 0.01 % OIL Apply to scalp after shower once daily one-seven times weekly  . furosemide (LASIX) 40 MG tablet Take 1 tablet (40 mg total) by mouth daily. If weight increase by 2 pounds overnight or leg swelling increase- take 1.5 tablets (60 mg)  . gabapentin (NEURONTIN) 300 MG capsule Take 300 mg by mouth 3 (three) times daily.  . halobetasol (ULTRAVATE) 0.05 % cream  APPLY A THIN LAYER TO RED, SCALY AREAS AS DIRECTED UP TO 3 TIMES A WEEK AS NEEDED, AS DIRECTED  . ipratropium (ATROVENT) 0.06 % nasal spray Can use two sprays in each nostril every six hours if needed to dry up runny nose.  . Menthol, Topical Analgesic, (BIOFREEZE EX) Apply topically.  . mupirocin ointment (BACTROBAN) 2 % Apply 3 application topically as needed.  . potassium chloride (KLOR-CON) 10 MEQ tablet Take 2 tablets (20 mEq total) by mouth daily.  . simvastatin (ZOCOR) 10 MG tablet Take 10 mg by mouth at bedtime.   . [DISCONTINUED] furosemide  (LASIX) 20 MG tablet Take 1 tablet (20 mg total) by mouth as directed. daily     Allergies: Allergies  Allergen Reactions  . Ciprofloxacin Diarrhea  . Sulfa Antibiotics Hives and Rash    Severe Rash  . Succinylsulphathiazole Rash  . Sulfamethoxazole Rash    Social History: The patient  reports that she has never smoked. She has never used smokeless tobacco. She reports that she does not drink alcohol and does not use drugs.   Family History: The patient's family history includes Cancer in her grandchild; Colon polyps in her son; Stroke in her father.   Review of Systems: Please see the history of present illness.   All other systems are reviewed and negative.   Physical Exam: VS:  BP 100/60   Pulse (!) 41   Ht 5' (1.524 m)   Wt 124 lb (56.2 kg)   SpO2 98%   BMI 24.22 kg/m  .  BMI Body mass index is 24.22 kg/m.  Wt Readings from Last 3 Encounters:  06/20/20 124 lb (56.2 kg)  03/02/20 119 lb 12 oz (54.3 kg)  02/13/20 117 lb (53.1 kg)    General: Pleasant. Well developed, well nourished and in no acute distress.   HEENT: Normal.  Neck: Supple, no JVD, carotid bruits, or masses noted.  Cardiac: Regular rate and rhythm. No murmurs, rubs, or gallops. No edema.  Respiratory:  Lungs are clear to auscultation bilaterally with normal work of breathing.  GI: Soft and nontender.  MS: No deformity or atrophy. Gait and ROM intact.  Skin: Warm and dry. Color is normal.  Neuro:  Strength and sensation are intact and no gross focal deficits noted.  Psych: Alert, appropriate and with normal affect.   LABORATORY DATA:  EKG:  EKG is not ordered today.    Lab Results  Component Value Date   WBC 7.4 01/30/2020   HGB 13.2 01/30/2020   HCT 43.0 01/30/2020   PLT PLATELET CLUMPS NOTED ON SMEAR, UNABLE TO ESTIMATE 01/30/2020   GLUCOSE 78 02/08/2020   CHOL 154 09/29/2019   TRIG 61 09/29/2019   HDL 53 09/29/2019   LDLCALC 89 09/29/2019   ALT 25 01/30/2020   AST 40 01/30/2020    NA 139 02/08/2020   K 4.5 02/08/2020   CL 99 02/08/2020   CREATININE 1.03 (H) 02/08/2020   BUN 28 (H) 02/08/2020   CO2 21 02/08/2020   TSH 4.314 01/30/2020   INR 1.41 12/16/2010     BNP (last 3 results) Recent Labs    01/30/20 1555  BNP 617.8*    ProBNP (last 3 results) Recent Labs    10/14/19 1148 01/18/20 1138 02/08/20 1137  PROBNP 2,724* 2,725* 1,550*     Other Studies Reviewed Today:  Echo 01/31/20 1. Left ventricular ejection fraction, by visual estimation, is 55 to  60%. The left ventricle has normal function. There is no  left ventricular  hypertrophy.  2. Left ventricular diastolic function could not be evaluated due to  atrial fibrillation.  3. The left ventricle has no regional wall motion abnormalities.  4. Global right ventricle has moderately reduced systolic function.The  right ventricular size is mildly enlarged. No increase in right  ventricular wall thickness.  5. Left atrial size was normal.  6. Right atrial size was severely dilated.  7. The mitral valve is normal in structure. Mild mitral valve prolapse of  the anterior leaflet. There is mild to moderate mitral valve regurgitation  eccentrically directed towards the lateral wall. No evidence of mitral  stenosis.  8. The tricuspid valve is normal in structure. Tricuspid valve  regurgitation is mild.  9. The aortic valve is tricuspid. Aortic valve regurgitation is trivial.  No evidence of aortic valve sclerosis or stenosis.  10. The pulmonic valve was normal in structure. Pulmonic valve  regurgitation is not visualized.  11. Mildly elevated pulmonary artery systolic pressure.  12. The inferior vena cava is dilated in size with <50% respiratory  variability, suggesting right atrial pressure of 15 mmHg.  13. Trivial pericardial effusion is present.  14. The pericardial effusion is posterior to the left ventricle.     ASSESSMENT & PLAN:   1. Chronic diastolic HF  2. MVP - with  mild to moderate MR -   3. Persistent AF  4. Chronic anticoagulation  5. Advanced age -    Current medicines are reviewed with the patient today.  The patient does not have concerns regarding medicines other than what has been noted above.  The following changes have been made:  See above.  Labs/ tests ordered today include:   No orders of the defined types were placed in this encounter.    Disposition:   FU with *** in {gen number [id number]} {Days to years:10300}.   Patient is agreeable to this plan and will call if any problems develop in the interim.   SignedTruitt Merle, NP  06/20/2020 3:36 PM  [facility] [address] [place], [place]  [address] Phone: [phone] Fax: [phone]

## 2020-06-20 ENCOUNTER — Other Ambulatory Visit: Payer: Self-pay

## 2020-06-20 ENCOUNTER — Encounter: Payer: Self-pay | Admitting: Nurse Practitioner

## 2020-06-20 ENCOUNTER — Ambulatory Visit (INDEPENDENT_AMBULATORY_CARE_PROVIDER_SITE_OTHER): Payer: Medicare Other | Admitting: Nurse Practitioner

## 2020-06-20 VITALS — BP 100/60 | HR 88 | Ht 60.0 in | Wt 124.0 lb

## 2020-06-20 DIAGNOSIS — I5032 Chronic diastolic (congestive) heart failure: Secondary | ICD-10-CM

## 2020-06-20 DIAGNOSIS — I4821 Permanent atrial fibrillation: Secondary | ICD-10-CM

## 2020-06-20 DIAGNOSIS — R5383 Other fatigue: Secondary | ICD-10-CM | POA: Diagnosis not present

## 2020-06-20 DIAGNOSIS — I10 Essential (primary) hypertension: Secondary | ICD-10-CM | POA: Diagnosis not present

## 2020-06-20 DIAGNOSIS — R609 Edema, unspecified: Secondary | ICD-10-CM

## 2020-06-20 MED ORDER — ATENOLOL 25 MG PO TABS: ORAL_TABLET | ORAL | 3 refills | Status: DC

## 2020-06-20 NOTE — Progress Notes (Signed)
CARDIOLOGY OFFICE NOTE  Date:  06/20/2020    [name] Dyer Date of Birth: 09/21/1932 Medical Record #[id number]  PCP:  [name] Cuff, MD  Cardiologist:  [name] Dyer  Chief Complaint  Patient presents with  . Follow-up    Seen for Dr. Harrington Dyer    History of Present Illness: [name] Dyer is a 86 y.o. female who presents today for a follow up visit. Seen for Dr. Harrington Dyer.   She has a history of persistent AF, chronic diastolic HF, MR, HTN and HLD.   Admitted with acute on chronic diastolic heart failure February 2021 after failing outpatient diuresis. Echocardiogram during admission showed LV function of 55 to 60%, could not evaluate diastolic function secondary to atrial fibrillation, mild to moderate mitral regurgitation. Discharge weight 55.6kg.  Seen virtually by Dr. Harrington Dyer 02/13/20. Last seen by [name] Dyer for in office visit back in early March - weight was stable. More troubled with her neuropathy.   The patient does not have symptoms concerning for COVID-19 infection (fever, chills, cough, or new shortness of breath).   Comes in today. Here alone. She feels like she is "drugged" - this is ongoing. Not getting worse but maybe sometimes so. Her weight is going up and down. She is dizzy all the time. No falls but feels like she is going to. No syncope. Not really short of breath. No chest pain. Has not seen any actual swelling. But notes her weight is creeping back up. She is taking an extra 1/2 of Lasix about every 3rd day. She does her own medicines - doing a pill box because she had some troubles remembering when just taking pills out of a box. BP seems to be soft - not really checking this at home. She wishes she just felt some better.   Past Medical History:  Diagnosis Date  . Anemia   . Arrhythmia   . Cataract   . Colon polyps   . Community acquired pneumonia Feb 2013   LLL  . Congestive heart failure ([place])   . Hyperlipidemia   . Hypertension   . Memory difficulty 10/01/2016   . Osteoporosis   . Pleural effusion, right Feb 2013  . SVT (supraventricular tachycardia) ([place]) feb 2013  . UTI (lower urinary tract infection)     Past Surgical History:  Procedure Laterality Date  . ABDOMINAL HYSTERECTOMY  1983  . APPENDECTOMY  1983  . COLONOSCOPY    . COLONOSCOPY N/A 06/10/2016   Procedure: COLONOSCOPY;  Surgeon: [name] Gunning, MD;  [name];  Service: Gastroenterology;  Laterality: N/A;  . ESOPHAGOGASTRODUODENOSCOPY N/A 06/10/2016   Procedure: ESOPHAGOGASTRODUODENOSCOPY (EGD);  Surgeon: [name] Gunning, MD;  [name];  Service: Gastroenterology;  Laterality: N/A;  . HEMIARTHROPLASTY HIP  2011   Left femoral neck fracture  . UPPER GASTROINTESTINAL ENDOSCOPY       Medications: Current Meds  Medication Sig  . acetaminophen (TYLENOL) 500 MG tablet Take 1,000 mg by mouth every 6 (six) hours as needed for mild pain or headache.   . alendronate (FOSAMAX) 70 MG tablet Take 70 mg by mouth every Monday.   [name] Dyer apixaban (ELIQUIS) 2.5 MG TABS tablet Take 1 tablet (2.5 mg total) by mouth 2 (two) times daily.  [name] Dyer atenolol (TENORMIN) 25 MG tablet Take 1 tablet twice a day  . cholecalciferol (VITAMIN D3) 25 MCG (1000 UT) tablet Take 2,000 Units by mouth daily.  . Cyanocobalamin (VITAMIN B-12) 5000 MCG TBDP Take 5,000 mcg by mouth daily.   [name] Dyer  donepezil (ARICEPT) 10 MG tablet Take 10 mg by mouth at bedtime.  [name] Dyer estradiol (ESTRACE) 0.5 MG tablet Take 0.25 mg by mouth every other day.   . Fluocinolone Acetonide Scalp (DERMA-SMOOTHE/FS SCALP) 0.01 % OIL Apply to scalp after shower once daily one-seven times weekly  . furosemide (LASIX) 40 MG tablet Take 1 tablet (40 mg total) by mouth daily. If weight increase by 2 pounds overnight or leg swelling increase- take 1.5 tablets (60 mg)  . gabapentin (NEURONTIN) 300 MG capsule Take 300 mg by mouth 3 (three) times daily.  . halobetasol (ULTRAVATE) 0.05 % cream APPLY A THIN LAYER TO RED, SCALY AREAS  AS DIRECTED UP TO 3 TIMES A WEEK AS NEEDED, AS DIRECTED  . ipratropium (ATROVENT) 0.06 % nasal spray Can use two sprays in each nostril every six hours if needed to dry up runny nose.  . Menthol, Topical Analgesic, (BIOFREEZE EX) Apply topically.  . mupirocin ointment (BACTROBAN) 2 % Apply 3 application topically as needed.  . potassium chloride (KLOR-CON) 10 MEQ tablet Take 2 tablets (20 mEq total) by mouth daily.  . simvastatin (ZOCOR) 10 MG tablet Take 10 mg by mouth at bedtime.   . [DISCONTINUED] atenolol (TENORMIN) 25 MG tablet Take 1 tablet in the evening  . [DISCONTINUED] atenolol (TENORMIN) 50 MG tablet Take 1 tablet every morning  . [DISCONTINUED] furosemide (LASIX) 20 MG tablet Take 1 tablet (20 mg total) by mouth as directed. daily     Allergies: Allergies  Allergen Reactions  . Ciprofloxacin Diarrhea  . Sulfa Antibiotics Hives and Rash    Severe Rash  . Succinylsulphathiazole Rash  . Sulfamethoxazole Rash    Social History: The patient  reports that she has never smoked. She has never used smokeless tobacco. She reports that she does not drink alcohol and does not use drugs.   Family History: The patient's family history includes Cancer in her grandchild; Colon polyps in her son; Stroke in her father.   Review of Systems: Please see the history of present illness.   All other systems are reviewed and negative.   Physical Exam: VS:  BP 100/60   Pulse (!) 41   Ht 5' (1.524 m)   Wt 124 lb (56.2 kg)   SpO2 98%   BMI 24.22 kg/m  .  BMI Body mass index is 24.22 kg/m.  Wt Readings from Last 3 Encounters:  06/20/20 124 lb (56.2 kg)  03/02/20 119 lb 12 oz (54.3 kg)  02/13/20 117 lb (53.1 kg)    General: Pleasant. Elderly. Alert and in no acute distress.Using a walker.   Cardiac: Irregular irregular rhythm. Her rate is fine.  No edema.  Respiratory:  Lungs are clear to auscultation bilaterally with normal work of breathing.  GI: Soft and nontender.  MS: No  deformity or atrophy. Gait and ROM intact.  Skin: Warm and dry. Color is normal.  Neuro:  Strength and sensation are intact and no gross focal deficits noted.  Psych: Alert, appropriate and with normal affect.   LABORATORY DATA:  EKG:  EKG is not ordered today.    Lab Results  Component Value Date   WBC 7.4 01/30/2020   HGB 13.2 01/30/2020   HCT 43.0 01/30/2020   PLT PLATELET CLUMPS NOTED ON SMEAR, UNABLE TO ESTIMATE 01/30/2020   GLUCOSE 78 02/08/2020   CHOL 154 09/29/2019   TRIG 61 09/29/2019   HDL 53 09/29/2019   LDLCALC 89 09/29/2019   ALT 25 01/30/2020   AST 40 01/30/2020  NA 139 02/08/2020   K 4.5 02/08/2020   CL 99 02/08/2020   CREATININE 1.03 (H) 02/08/2020   BUN 28 (H) 02/08/2020   CO2 21 02/08/2020   TSH 4.314 01/30/2020   INR 1.41 12/16/2010     BNP (last 3 results) Recent Labs    01/30/20 1555  BNP 617.8*    ProBNP (last 3 results) Recent Labs    10/14/19 1148 01/18/20 1138 02/08/20 1137  PROBNP 2,724* 2,725* 1,550*     Other Studies Reviewed Today:  Echo 01/31/20 1. Left ventricular ejection fraction, by visual estimation, is 55 to  60%. The left ventricle has normal function. There is no left ventricular  hypertrophy.  2. Left ventricular diastolic function could not be evaluated due to  atrial fibrillation.  3. The left ventricle has no regional wall motion abnormalities.  4. Global right ventricle has moderately reduced systolic function.The  right ventricular size is mildly enlarged. No increase in right  ventricular wall thickness.  5. Left atrial size was normal.  6. Right atrial size was severely dilated.  7. The mitral valve is normal in structure. Mild mitral valve prolapse of  the anterior leaflet. There is mild to moderate mitral valve regurgitation  eccentrically directed towards the lateral wall. No evidence of mitral  stenosis.  8. The tricuspid valve is normal in structure. Tricuspid valve  regurgitation is mild.    9. The aortic valve is tricuspid. Aortic valve regurgitation is trivial.  No evidence of aortic valve sclerosis or stenosis.  10. The pulmonic valve was normal in structure. Pulmonic valve  regurgitation is not visualized.  11. Mildly elevated pulmonary artery systolic pressure.  12. The inferior vena cava is dilated in size with <50% respiratory  variability, suggesting right atrial pressure of 15 mmHg.  13. Trivial pericardial effusion is present.  14. The pericardial effusion is posterior to the left ventricle.     ASSESSMENT & PLAN:   1. Chronic diastolic HF - weight is slowly creeping back up - she does not wish to increase her daily dose - she will take an extra 1/2 for the next 3 days. Lab today.   2. MVP - with mild to moderate MR - would follow - manage medically.   3. Persistent AF - her rate is good. She is on Atenolol BID. We are going to give her a trial of cutting this back - see if it helps her feel better - hopefully will stay have good rate control.   4. Chronic anticoagulation - no problems noted - needs lab today.   5. Advanced age - she wishes she felt better overall - less of this "drunk feeling" - I think this is chronic for her but she feels it is progressive/worsening. BP is soft - she is willing to try and be on less Atenolol - will cut back to 25 mg BID. I suspect some of this is multifactorial given her age/diastolic HF/MR/MVP, etc.  I have asked her to check some occasional BPs for [place].   Current medicines are reviewed with the patient today.  The patient does not have concerns regarding medicines other than what has been noted above.  The following changes have been made:  See above.  Labs/ tests ordered today include:    Orders Placed This Encounter  Procedures  . Basic metabolic panel  . CBC     Disposition:   FU with [name] Dyer in about a month.    Patient is agreeable to this  plan and will call if any problems develop in the interim.    SignedTruitt Merle, NP  06/20/2020 4:31 PM  [facility] [address] [place], [place]  [address] Phone: [phone] Fax: [phone]

## 2020-06-20 NOTE — Patient Instructions (Addendum)
After Visit Summary:  We will be checking the following labs today - BMET and CBC   Medication Instructions:    Continue with your current medicines. BUT  I am cutting the Atenolol back to 25 mg twice a day - morning and night  Take an extra 1/2 of Lasix for the next 3 days and then back to your regular schedule    If you need a refill on your cardiac medications before your next appointment, please call your pharmacy.     Testing/Procedures To Be Arranged:  N/A  Follow-Up:   See [place] back in about a month for a recheck.     At [facility], you and your health needs are our priority.  As part of our continuing mission to provide you with exceptional heart care, we have created designated Provider Care Teams.  These Care Teams include your primary Cardiologist (physician) and Advanced Practice Providers (APPs -  Physician Assistants and Nurse Practitioners) who all work together to provide you with the care you need, when you need it.  Special Instructions:  . Stay safe, wash your hands for at least 20 seconds and wear a mask when needed.  . It was good to talk with you today.    Call the [place] office at [phone] if you have any questions, problems or concerns.

## 2020-06-21 LAB — BASIC METABOLIC PANEL
BUN/Creatinine Ratio: 27 (ref 12–28)
BUN: 30 mg/dL — ABNORMAL HIGH (ref 8–27)
CO2: 27 mmol/L (ref 20–29)
Calcium: 9.5 mg/dL (ref 8.7–10.3)
Chloride: 100 mmol/L (ref 96–106)
Creatinine, Ser: 1.11 mg/dL — ABNORMAL HIGH (ref 0.57–1.00)
GFR calc Af Amer: 52 mL/min/{1.73_m2} — ABNORMAL LOW (ref >59)
GFR calc non Af Amer: 45 mL/min/{1.73_m2} — ABNORMAL LOW (ref >59)
Glucose: 105 mg/dL — ABNORMAL HIGH (ref 65–99)
Potassium: 4.2 mmol/L (ref 3.5–5.2)
Sodium: 140 mmol/L (ref 134–144)

## 2020-06-21 LAB — CBC
Hematocrit: 38 % (ref 34.0–46.6)
Hemoglobin: 12.4 g/dL (ref 11.1–15.9)
MCH: 30.9 pg (ref 26.6–33.0)
MCHC: 32.6 g/dL (ref 31.5–35.7)
MCV: 95 fL (ref 79–97)
RBC: 4.01 x10E6/uL (ref 3.77–5.28)
RDW: 12.8 % (ref 11.7–15.4)
WBC: 7.8 10*3/uL (ref 3.4–10.8)

## 2020-07-09 ENCOUNTER — Other Ambulatory Visit: Payer: Self-pay

## 2020-07-09 ENCOUNTER — Encounter: Payer: Self-pay | Admitting: Physician Assistant

## 2020-07-09 ENCOUNTER — Ambulatory Visit (INDEPENDENT_AMBULATORY_CARE_PROVIDER_SITE_OTHER): Payer: Medicare Other | Admitting: Physician Assistant

## 2020-07-09 VITALS — BP 134/70 | HR 62 | Ht 60.0 in | Wt 123.6 lb

## 2020-07-09 DIAGNOSIS — R627 Adult failure to thrive: Secondary | ICD-10-CM

## 2020-07-09 DIAGNOSIS — I5032 Chronic diastolic (congestive) heart failure: Secondary | ICD-10-CM

## 2020-07-09 DIAGNOSIS — I340 Nonrheumatic mitral (valve) insufficiency: Secondary | ICD-10-CM | POA: Diagnosis not present

## 2020-07-09 DIAGNOSIS — R5383 Other fatigue: Secondary | ICD-10-CM

## 2020-07-09 NOTE — Progress Notes (Signed)
Cardiology Office Note    Date:  07/09/2020   ID:  [name] Dyer, DOB Oct 21, 1932, MRN [id number]  PCP:  [name], MD  Cardiologist:  Dr. Harrington Challenger  Chief Complaint: 1  Month follow up  History of Present Illness:   [name] Dyer is a 86 y.o. female with a history of permanent atrial fibrillation, chronic diastolic heart failure, mitral regurgitation,  hypertension and HLD seen for follow up.  Admitted with acute on chronic diastolic heart failure February 2021 after failing outpatient diuresis.  Echocardiogram during admission showed LV function of 55 to 60%, could not evaluated diastolic function secondary to atrial fibrillation, mild to moderate mitral regurgitation.   Last seen by Truitt Merle. Weight was gradually going up. Declined lasix titration. Some drunk feeling reduced Atenolol to 75m BID. Lab was stable.   Here today for follow up.  Patient continues to feel fatigue, tired and no energy especially first thing in the morning then gradually improved and seems back to her baseline late afternoon.  Does not check her pulse, blood pressure or blood sugar.  She eats very small meals.  Denies dizziness, chest pain, shortness of breath, palpitation or lower extremity edema.   Past Medical History:  Diagnosis Date  . Anemia   . Arrhythmia   . Cataract   . Colon polyps   . Community acquired pneumonia Feb 2013   LLL  . Congestive heart failure ([facility]   . Hyperlipidemia   . Hypertension   . Memory difficulty 10/01/2016  . Osteoporosis   . Pleural effusion, right Feb 2013  . SVT (supraventricular tachycardia) ([facility] feb 2013  . UTI (lower urinary tract infection)     Past Surgical History:  Procedure Laterality Date  . ABDOMINAL HYSTERECTOMY  1983  . APPENDECTOMY  1983  . COLONOSCOPY    . COLONOSCOPY N/A 06/10/2016   Procedure: COLONOSCOPY;  Surgeon: SManus Gunning MD;  [facility];  Service: Gastroenterology;  Laterality: N/A;  .  ESOPHAGOGASTRODUODENOSCOPY N/A 06/10/2016   Procedure: ESOPHAGOGASTRODUODENOSCOPY (EGD);  Surgeon: SManus Gunning MD;  [facility];  Service: Gastroenterology;  Laterality: N/A;  . HEMIARTHROPLASTY HIP  2011   Left femoral neck fracture  . UPPER GASTROINTESTINAL ENDOSCOPY      Current Medications: Prior to Admission medications   Medication Sig Start Date End Date Taking? Authorizing Provider  acetaminophen (TYLENOL) 500 MG tablet Take 1,000 mg by mouth every 6 (six) hours as needed for mild pain or headache.     [provider]  alendronate (FOSAMAX) 70 MG tablet Take 70 mg by mouth every Monday.  09/30/16   [provider]  apixaban (ELIQUIS) 2.5 MG TABS tablet Take 1 tablet (2.5 mg total) by mouth 2 (two) times daily. 03/23/20   RFay Records MD  atenolol (TENORMIN) 25 MG tablet Take 1 tablet twice a day 06/20/20   GBurtis Junes NP  cholecalciferol (VITAMIN D3) 25 MCG (1000 UT) tablet Take 2,000 Units by mouth daily.    [provider]  Cyanocobalamin (VITAMIN B-12) 5000 MCG TBDP Take 5,000 mcg by mouth daily.     [provider]  donepezil (ARICEPT) 10 MG tablet Take 10 mg by mouth at bedtime.    [provider]  estradiol (ESTRACE) 0.5 MG tablet Take 0.25 mg by mouth every other day.  02/26/18   [provider]  Fluocinolone Acetonide Scalp (DERMA-SMOOTHE/FS SCALP) 0.01 % OIL Apply to scalp after shower once daily one-seven times weekly 03/16/20  Kozlow, Donnamarie Poag, MD  furosemide (LASIX) 40 MG tablet Take 1 tablet (40 mg total) by mouth daily. If weight increase by 2 pounds overnight or leg swelling increase- take 1.5 tablets (60 mg) 05/30/20   [name], MD  gabapentin (NEURONTIN) 300 MG capsule Take 300 mg by mouth 3 (three) times daily.    [provider]  halobetasol (ULTRAVATE) 0.05 % cream APPLY A THIN LAYER TO RED, SCALY AREAS AS DIRECTED UP TO 3 TIMES A WEEK AS NEEDED, AS DIRECTED 03/16/20   Kozlow, Donnamarie Poag, MD  ipratropium (ATROVENT) 0.06 % nasal spray Can use two sprays in each nostril every six hours if needed to dry up runny nose. 03/26/20   Kozlow, Donnamarie Poag, MD  Menthol, Topical Analgesic, (BIOFREEZE EX) Apply topically.    [provider]  mupirocin ointment (BACTROBAN) 2 % Apply 3 application topically as needed. 02/27/20   [provider]  potassium chloride (KLOR-CON) 10 MEQ tablet Take 2 tablets (20 mEq total) by mouth daily. 03/14/20   [name], MD  simvastatin (ZOCOR) 10 MG tablet Take 10 mg by mouth at bedtime.  02/15/18   [provider]    Allergies:   Ciprofloxacin, Sulfa antibiotics, Succinylsulphathiazole, and Sulfamethoxazole   Social History   Socioeconomic History  . Marital status: Single    Spouse name: Not on file  . Number of children: 2  . Years of education: 69  . Highest education level: Not on file  Occupational History  . Occupation: Retired  Tobacco Use  . Smoking status: Never Smoker  . Smokeless tobacco: Never Used  Vaping Use  . Vaping Use: Never used  Substance and Sexual Activity  . Alcohol use: No  . Drug use: No  . Sexual activity: Not on file  Other Topics Concern  . Not on file  Social History Narrative   Married, retired, 2 sons. Husband is disabled, non-ambulatory. Spends the the winters in [place].   Right-handed   Caffeine: Cheerwine or hot chocolate occasionally but otherwise none   Social Determinants of Radio broadcast assistant Strain:   . Difficulty of Paying Living Expenses:   Food Insecurity:   . Worried About Charity fundraiser in the Last Year:   . Arboriculturist in the Last Year:   Transportation Needs:   . Film/video editor (Medical):   Marland Kitchen Lack of Transportation (Non-Medical):   Physical Activity:   . Days of Exercise per Week:   . Minutes of Exercise per Session:   Stress:   . Feeling of Stress :   Social Connections:   . Frequency of Communication with Friends and Family:   .  Frequency of Social Gatherings with Friends and Family:   . Attends Religious Services:   . Active Member of Clubs or Organizations:   . Attends Archivist Meetings:   Marland Kitchen Marital Status:      Family History:  The patient's family history includes Cancer in her grandchild; Colon polyps in her son; Stroke in her father.   ROS:   Please see the history of present illness.    ROS All other systems reviewed and are negative.   PHYSICAL EXAM:   VS:  BP 134/70   Pulse 62   Ht 5' (1.524 m)   Wt 123 lb 9.6 oz (56.1 kg)   SpO2 99%   BMI 24.14 kg/m    GEN: Well nourished, well developed, in no acute distress  HEENT: normal  Neck: no JVD, carotid bruits, or masses Cardiac: Irregularly irregular; no murmurs, rubs, or gallops,no edema  Respiratory:  clear to auscultation bilaterally, normal work of breathing GI: soft, nontender, nondistended, + BS MS: no deformity or atrophy  Skin: warm and dry, no rash Neuro:  Alert and Oriented x 3, Strength and sensation are intact Psych: euthymic mood, full affect  Wt Readings from Last 3 Encounters:  07/09/20 123 lb 9.6 oz (56.1 kg)  06/20/20 124 lb (56.2 kg)  03/02/20 119 lb 12 oz (54.3 kg)      Studies/Labs Reviewed:   EKG:  EKG is not  ordered today.    Recent Labs: 01/30/2020: ALT 25; B Natriuretic Peptide 617.8; TSH 4.314 02/08/2020: NT-Pro BNP 1,550 06/20/2020: BUN 30; Creatinine, Ser 1.11; Hemoglobin 12.4; Platelets CANCELED; Potassium 4.2; Sodium 140   Lipid Panel    Component Value Date/Time   CHOL 154 09/29/2019 1152   TRIG 61 09/29/2019 1152   HDL 53 09/29/2019 1152   CHOLHDL 2.9 09/29/2019 1152   LDLCALC 89 09/29/2019 1152    Additional studies/ records that were reviewed today include:   Echocardiogram: 01/2020 1. Left ventricular ejection fraction, by visual estimation, is 55 to  60%. The left ventricle has normal function. There is no left ventricular  hypertrophy.  2. Left ventricular diastolic function  could not be evaluated due to  atrial fibrillation.  3. The left ventricle has no regional wall motion abnormalities.  4. Global right ventricle has moderately reduced systolic function.The  right ventricular size is mildly enlarged. No increase in right  ventricular wall thickness.  5. Left atrial size was normal.  6. Right atrial size was severely dilated.  7. The mitral valve is normal in structure. Mild mitral valve prolapse of  the anterior leaflet. There is mild to moderate mitral valve regurgitation  eccentrically directed towards the lateral wall. No evidence of mitral  stenosis.  8. The tricuspid valve is normal in structure. Tricuspid valve  regurgitation is mild.  9. The aortic valve is tricuspid. Aortic valve regurgitation is trivial.  No evidence of aortic valve sclerosis or stenosis.  10. The pulmonic valve was normal in structure. Pulmonic valve  regurgitation is not visualized.  11. Mildly elevated pulmonary artery systolic pressure.  12. The inferior vena cava is dilated in size with <50% respiratory  variability, suggesting right atrial pressure of 15 mmHg.  13. Trivial pericardial effusion is present.  14. The pericardial effusion is posterior to the left ventricle.    ASSESSMENT & PLAN:    1. Failure to thrive/fatigue Patient lives her by self and eats very small meals.  Reports fatigue, tiredness and low energy.  Prior labs suggest blood sugar of 78.  Her blood pressure and pulse normal today.  Recently unable to check platelet counts on lab.  Will repeat platelet count.  I have advised her and her son over phone that she need to check her blood sugar periodically as well as blood pressure for week and give [place] a reading.  2.  Permanent atrial fibrillation -Rate control.  Now on atenolol 25 mg twice daily.  Continue Eliquis for anticoagulation.  3.  Chronic diastolic heart failure -Volume status stable.  Continue Lasix at current dose.  4.  Mitral  regurgitation -Follow with routine echocardiogram.    Medication Adjustments/Labs and Tests Ordered: Current medicines are reviewed at length with the patient today.  Concerns regarding medicines are outlined above.  Medication changes, Labs and Tests ordered  today are listed in the Patient Instructions below. Patient Instructions  Medication Instructions:  Your physician recommends that you continue on your current medications as directed. Please refer to the Current Medication list given to you today.  *If you need a refill on your cardiac medications before your next appointment, please call your pharmacy*   Lab Work: TODAY:  PLATELET  If you have labs (blood work) drawn today and your tests are completely normal, you will receive your results only by: Marland Kitchen MyChart Message (if you have MyChart) OR . A paper copy in the mail If you have any lab test that is abnormal or we need to change your treatment, we will call you to review the results.   Testing/Procedures: None ordered   Follow-Up: At [facility], you and your health needs are our priority.  As part of our continuing mission to provide you with exceptional heart care, we have created designated Provider Care Teams.  These Care Teams include your primary Cardiologist (physician) and Advanced Practice Providers (APPs -  Physician Assistants and Nurse Practitioners) who all work together to provide you with the care you need, when you need it.  We recommend signing up for the patient portal called "MyChart".  Sign up information is provided on this After Visit Summary.  MyChart is used to connect with patients for Virtual Visits (Telemedicine).  Patients are able to view lab/test results, encounter notes, upcoming appointments, etc.  Non-urgent messages can be sent to your provider as well.   To learn more about what you can do with MyChart, go to [email or web address].    Your next appointment:   4 month(s)  The format  for your next appointment:   In Person  Provider:   You may see [name], MD or one of the following Advanced Practice Providers on your designated Care Team:    Richardson Dopp, PA-C  Vin [place], [place]    Other Instructions  Please monitor your blood pressure and heart rate 3 X's a day for 1 week and call us with those results.  You need to get a glucometer in order to keep a check on your glucose.  Contact your primary care physician regarding your sugars running low in the afternoon.     [name] Dyer, [place]  07/09/2020 4:28 PM    [facility] [address], [place], [place]  [address] Phone: [phone]; Fax: [phone]

## 2020-07-09 NOTE — Patient Instructions (Addendum)
Medication Instructions:  Your physician recommends that you continue on your current medications as directed. Please refer to the Current Medication list given to you today.  *If you need a refill on your cardiac medications before your next appointment, please call your pharmacy*   Lab Work: TODAY:  PLATELET  If you have labs (blood work) drawn today and your tests are completely normal, you will receive your results only by: Marland Kitchen MyChart Message (if you have MyChart) OR . A paper copy in the mail If you have any lab test that is abnormal or we need to change your treatment, we will call you to review the results.   Testing/Procedures: None ordered   Follow-Up: At [facility], you and your health needs are our priority.  As part of our continuing mission to provide you with exceptional heart care, we have created designated Provider Care Teams.  These Care Teams include your primary Cardiologist (physician) and Advanced Practice Providers (APPs -  Physician Assistants and Nurse Practitioners) who all work together to provide you with the care you need, when you need it.  We recommend signing up for the patient portal called "MyChart".  Sign up information is provided on this After Visit Summary.  MyChart is used to connect with patients for Virtual Visits (Telemedicine).  Patients are able to view lab/test results, encounter notes, upcoming appointments, etc.  Non-urgent messages can be sent to your provider as well.   To learn more about what you can do with MyChart, go to [email or web address].    Your next appointment:   4 month(s)  The format for your next appointment:   In Person  Provider:   You may see [name], MD or one of the following Advanced Practice Providers on your designated Care Team:    Richardson Dopp, PA-C  Vin [place], [place]    Other Instructions  Please monitor your blood pressure and heart rate 3 X's a day for 1 week and call us with those  results.  You need to get a glucometer in order to keep a check on your glucose.  Contact your primary care physician regarding your sugars running low in the afternoon.

## 2020-07-10 LAB — PLATELET COUNT: Platelets: 159 10*3/uL (ref 150–450)

## 2020-07-17 ENCOUNTER — Telehealth: Payer: Self-pay | Admitting: Internal Medicine

## 2020-07-17 NOTE — Telephone Encounter (Signed)
Pt c/o BP issue: STAT if pt c/o blurred vision, one-sided weakness or slurred speech  1. What are your last 5 BP readings?  07/09/20: BP- 124/75 HR-77  07/10/20: BP- 137/81 HR- 85      BP- 112/75 HR-62      BP- 112/62 HR-80      BP- 120/61 HR-89  07/11/20: BP- 129/74 HR-79      BP- 113/63 HR-81      BP- 129/66 HR-82  07/12/20: BP- 131/69 HR-80      BP- 143/76 HR-82      BP-108/65 HR-75  07/13/20: BP-145/68 HR-67      BP-141/66 HR-89      BP-142/70 HR-87      BP-130/76 HR-83  07/14/20: BP-144/72 HR-70      BP-153/69 HR-70      BP-141/76 HR-81      BP-130/69 HR-91      BP-116/75 HR-72      BP-132/78 HR-79  07/15/20: BP-137/70 HR-64      BP-  138/79 HR-99      BP-121/70 HR-85  07/16/20: BP- 146/76 HR-83      BP-125/57 HR-84      BP-130/60 HR-84  07/17/20: BP-144/77 HR-90  BP-121/59 HR-86  2. Are you having any other symptoms (ex. Dizziness, headache, blurred vision, passed out)? No  3. What is your BP issue? Patient is calling to report BP readings.

## 2020-07-17 NOTE — Telephone Encounter (Signed)
Covering for Vin. Please thank pt for relaying readings - all very acceptable and appropriate for her age/condition.  Would recommend following up with her PCP if fatigue has persisted.  Otherwise continue plan as discussed at Vin's visit.  Jasmine Mcbeth PA-C

## 2020-07-17 NOTE — Telephone Encounter (Signed)
Call placed to pt re: bp readings  She has been made aware that her bp readings are acceptable for her age/condition.  She has placed a call to hr PCP re: sugar readings and is awaiting on a call back from them.   Pt was grateful for the follow-up call.

## 2020-09-24 ENCOUNTER — Other Ambulatory Visit: Payer: Self-pay

## 2020-09-24 ENCOUNTER — Encounter: Payer: Self-pay | Admitting: Podiatry

## 2020-09-24 ENCOUNTER — Ambulatory Visit (INDEPENDENT_AMBULATORY_CARE_PROVIDER_SITE_OTHER): Payer: Medicare Other | Admitting: Podiatry

## 2020-09-24 DIAGNOSIS — B351 Tinea unguium: Secondary | ICD-10-CM

## 2020-09-24 DIAGNOSIS — M79676 Pain in unspecified toe(s): Secondary | ICD-10-CM | POA: Diagnosis not present

## 2020-09-27 NOTE — Progress Notes (Signed)
Subjective:  Patient ID: [name] Dyer, female    DOB: 1932/09/02,  MRN: [id number]  86 y.o. female presents with painful thick toenail that is difficult to trim. Pain interferes with ambulation. Aggravating factors include wearing enclosed shoe gear. Pain is relieved with periodic professional debridement..    Review of Systems: Negative except as noted in the HPI.  Past Medical History:  Diagnosis Date  . Anemia   . Arrhythmia   . Cataract   . Colon polyps   . Community acquired pneumonia Feb 2013   LLL  . Congestive heart failure ([place])   . Hyperlipidemia   . Hypertension   . Memory difficulty 10/01/2016  . Osteoporosis   . Pleural effusion, right Feb 2013  . SVT (supraventricular tachycardia) ([place]) feb 2013  . UTI (lower urinary tract infection)    Past Surgical History:  Procedure Laterality Date  . ABDOMINAL HYSTERECTOMY  1983  . APPENDECTOMY  1983  . COLONOSCOPY    . COLONOSCOPY N/A 06/10/2016   Procedure: COLONOSCOPY;  Surgeon: Manus Gunning, MD;  [facility];  Service: Gastroenterology;  Laterality: N/A;  . ESOPHAGOGASTRODUODENOSCOPY N/A 06/10/2016   Procedure: ESOPHAGOGASTRODUODENOSCOPY (EGD);  Surgeon: Manus Gunning, MD;  [facility];  Service: Gastroenterology;  Laterality: N/A;  . HEMIARTHROPLASTY HIP  2011   Left femoral neck fracture  . UPPER GASTROINTESTINAL ENDOSCOPY     Patient Active Problem List   Diagnosis Date Noted  . Acute cystitis 05/22/2020  . Chronic back pain 05/22/2020  . Cystocele without uterine prolapse 05/22/2020  . Pain of toe of left foot 04/26/2020  . Acute on chronic diastolic (congestive) heart failure ([place]) 01/31/2020  . CHF (congestive heart failure) ([place]) 01/30/2020  . Chronic rhinitis 11/09/2019  . Facial pain syndrome 11/09/2019  . Rash 11/09/2019  . Temporal arteritis ([place]) 11/09/2019  . Chronic anticoagulation 06/24/2018  . Peripheral neuropathy 06/24/2018  . Alzheimer's disease  ([place]) 04/08/2018  . Insomnia 04/08/2018  . Osteoporosis 04/08/2018  . Memory difficulty 10/01/2016  . Colitis presumed infectious 06/06/2016  . Chronic diastolic CHF (congestive heart failure) ([place]) 06/06/2016  . Permanent atrial fibrillation ([place]) 06/06/2016  . Hypertension 06/06/2016  . Hyperlipidemia 06/06/2016  . Diarrhea 06/04/2016  . Generalized abdominal pain 06/04/2016  . Anemia, unspecified 04/27/2012    Current Outpatient Medications:  .  acetaminophen (TYLENOL) 500 MG tablet, Take 1,000 mg by mouth every 6 (six) hours as needed for mild pain or headache. , Disp: , Rfl:  .  alendronate (FOSAMAX) 70 MG tablet, Take 70 mg by mouth every Monday. , Disp: , Rfl:  .  apixaban (ELIQUIS) 2.5 MG TABS tablet, Take 1 tablet (2.5 mg total) by mouth 2 (two) times daily., Disp: 180 tablet, Rfl: 1 .  atenolol (TENORMIN) 25 MG tablet, Take 1 tablet twice a day, Disp: 180 tablet, Rfl: 3 .  cholecalciferol (VITAMIN D3) 25 MCG (1000 UT) tablet, Take 2,000 Units by mouth daily., Disp: , Rfl:  .  Cyanocobalamin (VITAMIN B-12) 5000 MCG TBDP, Take 5,000 mcg by mouth daily. , Disp: , Rfl:  .  donepezil (ARICEPT) 10 MG tablet, Take 10 mg by mouth at bedtime., Disp: , Rfl:  .  estradiol (ESTRACE) 0.5 MG tablet, Take 0.25 mg by mouth every other day. , Disp: , Rfl:  .  Fluocinolone Acetonide Scalp (DERMA-SMOOTHE/FS SCALP) 0.01 % OIL, Apply to scalp after shower once daily one-seven times weekly, Disp: 118.28 mL, Rfl: 2 .  furosemide (LASIX) 40 MG tablet, Take 1 tablet (  40 mg total) by mouth daily. If weight increase by 2 pounds overnight or leg swelling increase- take 1.5 tablets (60 mg), Disp: 30 tablet, Rfl: 9 .  gabapentin (NEURONTIN) 300 MG capsule, Take 300 mg by mouth 3 (three) times daily., Disp: , Rfl:  .  halobetasol (ULTRAVATE) 0.05 % cream, APPLY A THIN LAYER TO RED, SCALY AREAS AS DIRECTED UP TO 3 TIMES A WEEK AS NEEDED, AS DIRECTED, Disp: 50 g, Rfl: 2 .  ipratropium (ATROVENT) 0.06 % nasal  spray, Can use two sprays in each nostril every six hours if needed to dry up runny nose., Disp: 15 mL, Rfl: 2 .  Menthol, Topical Analgesic, (BIOFREEZE EX), Apply topically., Disp: , Rfl:  .  potassium chloride (KLOR-CON) 10 MEQ tablet, Take 2 tablets (20 mEq total) by mouth daily., Disp: 180 tablet, Rfl: 3 .  potassium chloride (KLOR-CON) 10 MEQ tablet, Take 10 mEq by mouth 2 (two) times daily., Disp: , Rfl:  .  simvastatin (ZOCOR) 10 MG tablet, Take 10 mg by mouth at bedtime. , Disp: , Rfl:  .  traZODone (DESYREL) 50 MG tablet, Take by mouth., Disp: , Rfl:  Allergies  Allergen Reactions  . Ciprofloxacin Diarrhea  . Sulfa Antibiotics Hives and Rash    Severe Rash  . Succinylsulphathiazole Rash  . Sulfamethoxazole Rash   Social History   Occupational History  . Occupation: Retired  Tobacco Use  . Smoking status: Never Smoker  . Smokeless tobacco: Never Used  Vaping Use  . Vaping Use: Never used  Substance and Sexual Activity  . Alcohol use: No  . Drug use: No  . Sexual activity: Not on file    Objective:   Constitutional Pt is a pleasant 86 y.o. Caucasian female WD, WN in NAD.Marland Kitchen AAO x 3.   Vascular Capillary refill time to digits immediate b/l. Palpable pedal pulses b/l LE. Pedal hair present. Lower extremity skin temperature gradient within normal limits. No cyanosis or clubbing noted.  Neurologic Normal speech. Oriented to person, place, and time. Protective sensation intact 5/5 intact bilaterally with 10g monofilament b/l. Vibratory sensation intact b/l. Proprioception intact bilaterally.  Dermatologic Pedal skin with normal turgor, texture and tone bilaterally. No open wounds bilaterally. No interdigital macerations bilaterally. Toenails L hallux elongated, discolored, dystrophic, thickened, and crumbly with subungual debris and tenderness to dorsal palpation.  Orthopedic: Normal muscle strength 5/5 to all lower extremity muscle groups bilaterally. No pain crepitus or joint  limitation noted with ROM b/l. No gross bony deformities bilaterally. Patient ambulates independent of any assistive aids.   Radiographs: None Assessment:  No diagnosis found. Plan:  Patient was evaluated and treated and all questions answered.  Onychomycosis with pain -Nails palliatively debridement as below. -Educated on self-care  Procedure: Nail Debridement Rationale: Pain Type of Debridement: manual, sharp debridement. Instrumentation: Nail nipper, rotary burr. Number of Nails: 1  -Examined patient. -Toenails L hallux debrided in length and girth without iatrogenic bleeding with sterile nail nipper and dremel.  -Patient to report any pedal injuries to medical professional immediately. -Patient to continue soft, supportive shoe gear daily. -Patient/POA to call should there be question/concern in the interim.  No follow-ups on file.  Marzetta Board, DPM

## 2020-11-06 ENCOUNTER — Telehealth: Payer: Self-pay | Admitting: Internal Medicine

## 2020-11-06 NOTE — Telephone Encounter (Signed)
[name] Dyer is calling to confirm with Michalene she can make the appointment she scheduled for her earlier today.

## 2020-11-06 NOTE — Telephone Encounter (Signed)
Pt c/o medication issue:  1. Name of Medication: apixaban (ELIQUIS) 2.5 MG TABS tablet  2. How are you currently taking this medication (dosage and times per day)? 1 tablet by mouth two times daily   3. Are you having a reaction (difficulty breathing--STAT)? No   4. What is your medication issue? [name] Dyer is calling stating she received a letter from her pharmacy stating they will no longer cover her Eliquis after the first of the year and advised her to call our office to discuss alternative medications prior to switching. Please advise.

## 2020-11-06 NOTE — Telephone Encounter (Signed)
Spoke with pt and she confirmed that she will be here Thursday for her appt.  Advised I will send to [facility] so that she is aware.

## 2020-11-06 NOTE — Telephone Encounter (Signed)
Patient has scheduled appointment with Dr. Harrington Challenger for 11/08/20.  She will bring the letter w her.  She said the letter lists warfarin and Xarelto as possible covered alternatives to Eliquis.

## 2020-11-08 ENCOUNTER — Other Ambulatory Visit: Payer: Self-pay

## 2020-11-08 ENCOUNTER — Ambulatory Visit (INDEPENDENT_AMBULATORY_CARE_PROVIDER_SITE_OTHER): Payer: Medicare Other | Admitting: Internal Medicine

## 2020-11-08 ENCOUNTER — Encounter: Payer: Self-pay | Admitting: Internal Medicine

## 2020-11-08 VITALS — BP 126/56 | HR 72 | Ht 60.0 in | Wt 127.8 lb

## 2020-11-08 DIAGNOSIS — I4821 Permanent atrial fibrillation: Secondary | ICD-10-CM | POA: Diagnosis not present

## 2020-11-08 NOTE — Progress Notes (Signed)
Cardiology Office Note   Date:  11/08/2020   ID:  [name] Dyer, DOB [birth year], MRN [id number]  PCP:  [name], MD  Cardiologist:   [name], MD   Pt presents for f/u of permanent afib and diastolic CHF      History of Present Illness: [name] Dyer is a 86 y.o. female with a history of atrail  Fibrillation, chronic diastolic CHF, mitral regurgitation, HTN and HL   He was admitted in Feb 2021 with CHF   Echo showed LVEF 55 to 60%; mild to mod MR    The pt was last seen in [facility] by B Bhagat in July 2021  The pt says her breathing is OK   She denies CP   No LE swelling  No dizziness  Walking some  About to run out of Eliquis and [employer] will not cover           Current Meds  Medication Sig  . acetaminophen (TYLENOL) 500 MG tablet Take 1,000 mg by mouth every 6 (six) hours as needed for mild pain or headache.   . alendronate (FOSAMAX) 70 MG tablet Take 70 mg by mouth every Monday.   Marland Kitchen apixaban (ELIQUIS) 2.5 MG TABS tablet Take 1 tablet (2.5 mg total) by mouth 2 (two) times daily.  Marland Kitchen atenolol (TENORMIN) 25 MG tablet Take 1 tablet twice a day  . cholecalciferol (VITAMIN D3) 25 MCG (1000 UT) tablet Take 2,000 Units by mouth daily.  . Cyanocobalamin (VITAMIN B-12) 5000 MCG TBDP Take 5,000 mcg by mouth daily.   Marland Kitchen donepezil (ARICEPT) 10 MG tablet Take 10 mg by mouth at bedtime.  Marland Kitchen estradiol (ESTRACE) 0.5 MG tablet Take 0.25 mg by mouth every other day.   . Fluocinolone Acetonide Scalp (DERMA-SMOOTHE/FS SCALP) 0.01 % OIL Apply to scalp after shower once daily one-seven times weekly  . furosemide (LASIX) 40 MG tablet Take 1 tablet (40 mg total) by mouth daily. If weight increase by 2 pounds overnight or leg swelling increase- take 1.5 tablets (60 mg)  . gabapentin (NEURONTIN) 300 MG capsule Take 300 mg by mouth 3 (three) times daily.  . halobetasol (ULTRAVATE) 0.05 % cream APPLY A THIN LAYER TO RED, SCALY AREAS AS DIRECTED UP TO 3 TIMES A WEEK AS NEEDED, AS  DIRECTED  . ipratropium (ATROVENT) 0.06 % nasal spray Can use two sprays in each nostril every six hours if needed to dry up runny nose.  . Menthol, Topical Analgesic, (BIOFREEZE EX) Apply topically.  . potassium chloride (KLOR-CON) 10 MEQ tablet Take 2 tablets (20 mEq total) by mouth daily.  . simvastatin (ZOCOR) 10 MG tablet Take 10 mg by mouth at bedtime.   . traZODone (DESYREL) 50 MG tablet Take by mouth.     Allergies:   Ciprofloxacin, Sulfa antibiotics, Memantine, Succinylsulphathiazole, and Sulfamethoxazole   Past Medical History:  Diagnosis Date  . Anemia   . Arrhythmia   . Cataract   . Colon polyps   . Community acquired pneumonia Feb 2013   LLL  . Congestive heart failure ([place])   . Hyperlipidemia   . Hypertension   . Memory difficulty 10/01/2016  . Osteoporosis   . Pleural effusion, right Feb 2013  . SVT (supraventricular tachycardia) ([place]) feb 2013  . UTI (lower urinary tract infection)     Past Surgical History:  Procedure Laterality Date  . ABDOMINAL HYSTERECTOMY  1983  . APPENDECTOMY  1983  . COLONOSCOPY    . COLONOSCOPY N/A 06/10/2016  Procedure: COLONOSCOPY;  Surgeon: Manus Gunning, MD;  Location: [facility];  Service: Gastroenterology;  Laterality: N/A;  . ESOPHAGOGASTRODUODENOSCOPY N/A 06/10/2016   Procedure: ESOPHAGOGASTRODUODENOSCOPY (EGD);  Surgeon: Manus Gunning, MD;  Location: [facility];  Service: Gastroenterology;  Laterality: N/A;  . HEMIARTHROPLASTY HIP  2011   Left femoral neck fracture  . UPPER GASTROINTESTINAL ENDOSCOPY       Social History:  The patient  reports that she has never smoked. She has never used smokeless tobacco. She reports that she does not drink alcohol and does not use drugs.   Family History:  The patient's family history includes Cancer in her grandchild; Colon polyps in her son; Stroke in her father.    ROS:  Please see the history of present illness. All other systems are reviewed and  Negative to  the above problem except as noted.    PHYSICAL EXAM: VS:  BP (!) 126/56   Pulse 72   Ht 5' (1.524 m)   Wt 127 lb 12.8 oz (58 kg)   SpO2 98%   BMI 24.96 kg/m   GEN: Well nourished, well developed, in no acute distress  HEENT: normal  Neck: no JVD Cardiac: Irreg irreg  ; no murmurs; No LE  edema  Respiratory:  clear to auscultation bilaterally, normal work of breathing GI: soft, nontender, nondistended, + BS  MS: no deformity Moving all extremities   Skin: warm and dry Neuro:   Grossly intact Psych: euthymic mood, full affect   EKG:  EKG is not ordered today.   Lipid Panel    Component Value Date/Time   CHOL 154 09/29/2019 1152   TRIG 61 09/29/2019 1152   HDL 53 09/29/2019 1152   CHOLHDL 2.9 09/29/2019 1152   LDLCALC 89 09/29/2019 1152      Wt Readings from Last 3 Encounters:  11/08/20 127 lb 12.8 oz (58 kg)  07/09/20 123 lb 9.6 oz (56.1 kg)  06/20/20 124 lb (56.2 kg)      ASSESSMENT AND PLAN:  1  Permanent atrial fib   Keep on current regimen   Will need to check on switching to Xarelto    Get CBC and BMET today  2  Chronic diastolic CHF   Volume status appears ok today  Check BMET    Continue current meds  3  HTN  BP is OK     4  HL   Continue statin    Plan for f/u in 6 months       Current medicines are reviewed at length with the patient today.  The patient does not have concerns regarding medicines.  Signed, [name], MD  11/08/2020 11:18 AM    [address], [place], [place]  [address] Phone: [phone]; Fax: [phone]

## 2020-11-08 NOTE — Patient Instructions (Signed)
Medication Instructions:  No changes today.   *If you need a refill on your cardiac medications before your next appointment, please call your pharmacy*   Lab Work: Today: CBC, BMET If you have labs (blood work) drawn today and your tests are completely normal, you will receive your results only by: Marland Kitchen MyChart Message (if you have MyChart) OR . A paper copy in the mail If you have any lab test that is abnormal or we need to change your treatment, we will call you to review the results.   Testing/Procedures: none   Follow-Up: At [facility], you and your health needs are our priority.  As part of our continuing mission to provide you with exceptional heart care, we have created designated Provider Care Teams.  These Care Teams include your primary Cardiologist (physician) and Advanced Practice Providers (APPs -  Physician Assistants and Nurse Practitioners) who all work together to provide you with the care you need, when you need it.  We recommend signing up for the patient portal called "MyChart".  Sign up information is provided on this After Visit Summary.  MyChart is used to connect with patients for Virtual Visits (Telemedicine).  Patients are able to view lab/test results, encounter notes, upcoming appointments, etc.  Non-urgent messages can be sent to your provider as well.   To learn more about what you can do with MyChart, go to [email or web address].    Your next appointment:   6 month(s)  The format for your next appointment:   In Person  Provider:   You may see [name], MD or one of the following Advanced Practice Providers on your designated Care Team:    Richardson Dopp, PA-C  Robbie Lis, [place]    Other Instructions

## 2020-11-09 ENCOUNTER — Other Ambulatory Visit: Payer: Self-pay | Admitting: Allergy

## 2020-11-09 ENCOUNTER — Telehealth: Payer: Self-pay | Admitting: *Deleted

## 2020-11-09 LAB — CBC
Hematocrit: 38 % (ref 34.0–46.6)
Hemoglobin: 12.6 g/dL (ref 11.1–15.9)
MCH: 31.5 pg (ref 26.6–33.0)
MCHC: 33.2 g/dL (ref 31.5–35.7)
MCV: 95 fL (ref 79–97)
RBC: 4 x10E6/uL (ref 3.77–5.28)
RDW: 12.7 % (ref 11.7–15.4)
WBC: 7.2 10*3/uL (ref 3.4–10.8)

## 2020-11-09 LAB — BASIC METABOLIC PANEL
BUN/Creatinine Ratio: 23 (ref 12–28)
BUN: 26 mg/dL (ref 8–27)
CO2: 27 mmol/L (ref 20–29)
Calcium: 9.3 mg/dL (ref 8.7–10.3)
Chloride: 102 mmol/L (ref 96–106)
Creatinine, Ser: 1.14 mg/dL — ABNORMAL HIGH (ref 0.57–1.00)
GFR calc Af Amer: 50 mL/min/{1.73_m2} — ABNORMAL LOW (ref >59)
GFR calc non Af Amer: 44 mL/min/{1.73_m2} — ABNORMAL LOW (ref >59)
Glucose: 89 mg/dL (ref 65–99)
Potassium: 4.4 mmol/L (ref 3.5–5.2)
Sodium: 143 mmol/L (ref 134–144)

## 2020-11-09 NOTE — Telephone Encounter (Signed)
Called patient with her lab results. She wanted to let Dr. Harrington Challenger know that she called insurance company. She is going to switch to Xarelto.  Wants to get 7 days worth first.  Just to make sure she is going to be able to tolerate it.  Then she will get a 30 day supply for 108.53.    She has a lot of Eliquis right now and so will continue for now.   We may be able to provide a week of samples of Xarelto before sending the first prescription.

## 2020-11-15 NOTE — Telephone Encounter (Signed)
She should be on 15 mg Xarelto

## 2020-11-24 DIAGNOSIS — L309 Dermatitis, unspecified: Secondary | ICD-10-CM | POA: Insufficient documentation

## 2020-11-29 NOTE — Telephone Encounter (Signed)
Left message for patient to call back.

## 2020-11-29 NOTE — Telephone Encounter (Signed)
Pt called in and stated she was talking michalene earlier.  She has some addition info or questions , pt would not provide any other info.   Best number  [phone]

## 2020-11-29 NOTE — Telephone Encounter (Signed)
Spoke with patient. She still has a good supply of Eliquis.  When she gets low on her supply she will call office to request switching to Xarelto 15 mg.  Pt would like to just get a partial prescription at first.  Requsting 7 days to make sure she tolerates, and then if no adverse effects, will get 90 day supply.

## 2020-12-04 ENCOUNTER — Other Ambulatory Visit: Payer: Self-pay | Admitting: Allergy and Immunology

## 2020-12-14 ENCOUNTER — Telehealth: Payer: Self-pay | Admitting: Internal Medicine

## 2020-12-14 NOTE — Telephone Encounter (Signed)
Spoke with the patient who reports BP and HR readings from the last couple days: 12/15: 129/83, 116 and 135/72, 104 12/16: 139/73, 91 and 128/71, 90 12/17: 134/80, 87 Advised patient that her blood pressure looks good. She took HR while on the phone and it was 82.  Patient denies chest pain, SOB, dizziness, or palpitations. I advised her to continue to monitor her HR and BP and let us know if HR remains elevated or if she develops any symptoms.

## 2020-12-14 NOTE — Telephone Encounter (Signed)
Pt c/o BP issue: STAT if pt c/o blurred vision, one-sided weakness or slurred speech  1. What are your last 5 BP readings?  12/14/20 153/87 HR 75 12/11/20 129/83 HR 116 patient checked HR again and it was 106  2. Are you having any other symptoms (ex. Dizziness, headache, blurred vision, passed out)? No.  3. What is your BP issue? [name] Dyer is calling concerned about her recent blood pressure readings, she states she's been having some gum/tooth pain and didn't know if it could be related to that. Please advise.

## 2020-12-18 NOTE — Telephone Encounter (Signed)
[name] Dyer is returning Michalene's call. Please advise.

## 2020-12-18 NOTE — Telephone Encounter (Signed)
Spoke with patient.  She received another 90 day supply of Eliquis, so she is going to take this and do the trial of Xarelto in about 10 weeks.  Again, she only wants a 7 days supply to make sure she tolerates it and if she does, she will call and have the whole prescription sent to her.  Per her request, I will contact her pharmacy to let them know.

## 2020-12-24 DIAGNOSIS — M25549 Pain in joints of unspecified hand: Secondary | ICD-10-CM | POA: Insufficient documentation

## 2020-12-25 ENCOUNTER — Ambulatory Visit (INDEPENDENT_AMBULATORY_CARE_PROVIDER_SITE_OTHER): Payer: Medicare Other | Admitting: Podiatry

## 2020-12-25 ENCOUNTER — Other Ambulatory Visit: Payer: Self-pay

## 2020-12-25 DIAGNOSIS — M79676 Pain in unspecified toe(s): Secondary | ICD-10-CM | POA: Diagnosis not present

## 2020-12-25 DIAGNOSIS — B351 Tinea unguium: Secondary | ICD-10-CM | POA: Diagnosis not present

## 2020-12-25 DIAGNOSIS — R21 Rash and other nonspecific skin eruption: Secondary | ICD-10-CM | POA: Diagnosis not present

## 2020-12-25 NOTE — Patient Instructions (Signed)
Apply Cortisone-10 Cream to skin eruption twice daily until resolved.

## 2020-12-28 ENCOUNTER — Encounter: Payer: Self-pay | Admitting: Podiatry

## 2020-12-28 NOTE — Progress Notes (Signed)
Subjective:  Patient ID: [name], female    DOB: 1932/07/28,  MRN: [id number]  86 y.o. female presents with painful thick toenail that is difficult to trim. Pain interferes with ambulation. Aggravating factors include wearing enclosed shoe gear. Pain is relieved with periodic professional debridement..    Patient states she has developed a skin rash on the top of her left foot.  She denies any insect bite.  She states it is tender sometimes.  Review of Systems: Negative except as noted in the HPI.  Past Medical History:  Diagnosis Date  . Anemia   . Arrhythmia   . Cataract   . Colon polyps   . Community acquired pneumonia Feb 2013   LLL  . Congestive heart failure ([place])   . Hyperlipidemia   . Hypertension   . Memory difficulty 10/01/2016  . Osteoporosis   . Pleural effusion, right Feb 2013  . SVT (supraventricular tachycardia) ([place]) feb 2013  . UTI (lower urinary tract infection)    Past Surgical History:  Procedure Laterality Date  . ABDOMINAL HYSTERECTOMY  1983  . APPENDECTOMY  1983  . COLONOSCOPY    . COLONOSCOPY N/A 06/10/2016   Procedure: COLONOSCOPY;  Surgeon: Manus Gunning, MD;  Location: [facility];  Service: Gastroenterology;  Laterality: N/A;  . ESOPHAGOGASTRODUODENOSCOPY N/A 06/10/2016   Procedure: ESOPHAGOGASTRODUODENOSCOPY (EGD);  Surgeon: Manus Gunning, MD;  Location: [facility];  Service: Gastroenterology;  Laterality: N/A;  . HEMIARTHROPLASTY HIP  2011   Left femoral neck fracture  . UPPER GASTROINTESTINAL ENDOSCOPY     Patient Active Problem List   Diagnosis Date Noted  . Hand joint pain 12/24/2020  . Chronic dermatitis 11/24/2020  . Acute cystitis 05/22/2020  . Chronic back pain 05/22/2020  . Cystocele without uterine prolapse 05/22/2020  . Pain of toe of left foot 04/26/2020  . Acute on chronic diastolic (congestive) heart failure ([place]) 01/31/2020  . CHF (congestive heart failure) ([place]) 01/30/2020  . Chronic rhinitis  11/09/2019  . Facial pain syndrome 11/09/2019  . Rash 11/09/2019  . Temporal arteritis ([place]) 11/09/2019  . Chronic anticoagulation 06/24/2018  . Peripheral neuropathy 06/24/2018  . Alzheimer's disease ([place]) 04/08/2018  . Insomnia 04/08/2018  . Osteoporosis 04/08/2018  . Memory difficulty 10/01/2016  . Colitis presumed infectious 06/06/2016  . Chronic diastolic CHF (congestive heart failure) ([place]) 06/06/2016  . Permanent atrial fibrillation ([place]) 06/06/2016  . Hypertension 06/06/2016  . Hyperlipidemia 06/06/2016  . Diarrhea 06/04/2016  . Generalized abdominal pain 06/04/2016  . Anemia, unspecified 04/27/2012    Current Outpatient Medications:  .  acetaminophen (TYLENOL) 500 MG tablet, Take 1,000 mg by mouth every 6 (six) hours as needed for mild pain or headache. , Disp: , Rfl:  .  alendronate (FOSAMAX) 70 MG tablet, Take 70 mg by mouth every Monday. , Disp: , Rfl:  .  apixaban (ELIQUIS) 2.5 MG TABS tablet, Take 1 tablet (2.5 mg total) by mouth 2 (two) times daily., Disp: 180 tablet, Rfl: 1 .  atenolol (TENORMIN) 25 MG tablet, Take 1 tablet twice a day, Disp: 180 tablet, Rfl: 3 .  betamethasone dipropionate 0.05 % lotion, Apply topically., Disp: , Rfl:  .  carbamide peroxide (DEBROX) 6.5 % OTIC solution, Debrox 6.5 % ear drops  INSTILL 5 DROPS INTO AFFECTED EAR(S) BY OTIC ROUTE 2 TIMES PER DAY  use as instructed, Disp: , Rfl:  .  cholecalciferol (VITAMIN D3) 25 MCG (1000 UT) tablet, Take 2,000 Units by mouth daily., Disp: , Rfl:  .  Cyanocobalamin (  VITAMIN B-12) 5000 MCG TBDP, Take 5,000 mcg by mouth daily. , Disp: , Rfl:  .  donepezil (ARICEPT) 10 MG tablet, Take 10 mg by mouth at bedtime., Disp: , Rfl:  .  estradiol (ESTRACE) 0.5 MG tablet, Take 0.25 mg by mouth every other day. , Disp: , Rfl:  .  Fluocinolone Acetonide Scalp (DERMA-SMOOTHE/FS SCALP) 0.01 % OIL, Apply to scalp after shower once daily one-seven times weekly, Disp: 118.28 mL, Rfl: 2 .  furosemide (LASIX) 40 MG tablet,  Take 1 tablet (40 mg total) by mouth daily. If weight increase by 2 pounds overnight or leg swelling increase- take 1.5 tablets (60 mg), Disp: 30 tablet, Rfl: 9 .  gabapentin (NEURONTIN) 300 MG capsule, Take 300 mg by mouth 3 (three) times daily., Disp: , Rfl:  .  halobetasol (ULTRAVATE) 0.05 % cream, APPLY A THIN LAYER TO RED, SCALY AREAS AS DIRECTED UP TO 3 TIMES A WEEK AS NEEDED, AS DIRECTED, Disp: 50 g, Rfl: 2 .  ipratropium (ATROVENT) 0.06 % nasal spray, CAN USE 2 SPRAYS IN EACH NOSTRIL EVERY 6 HOURS IF NEEDED TO DRY UP RUNNY N0SE, Disp: 15 mL, Rfl: 0 .  memantine (NAMENDA XR) 14 MG CP24 24 hr capsule, Take by mouth., Disp: , Rfl:  .  Menthol, Topical Analgesic, (BIOFREEZE EX), Apply topically., Disp: , Rfl:  .  potassium chloride (KLOR-CON) 10 MEQ tablet, Take 10 mEq by mouth 2 (two) times daily., Disp: , Rfl:  .  simvastatin (ZOCOR) 10 MG tablet, Take 10 mg by mouth at bedtime. , Disp: , Rfl:  .  traZODone (DESYREL) 50 MG tablet, Take by mouth., Disp: , Rfl:  Allergies  Allergen Reactions  . Ciprofloxacin Diarrhea  . Sulfa Antibiotics Hives and Rash    Severe Rash  . Memantine Other (See Comments)    dizziness  . Succinylsulphathiazole Rash  . Sulfamethoxazole Rash   Social History   Occupational History  . Occupation: Retired  Tobacco Use  . Smoking status: Never Smoker  . Smokeless tobacco: Never Used  Vaping Use  . Vaping Use: Never used  Substance and Sexual Activity  . Alcohol use: No  . Drug use: No  . Sexual activity: Not on file    Objective:   Constitutional Pt is a pleasant 86 y.o. Caucasian female WD, WN in NAD.Marland Kitchen AAO x 3.   Vascular Capillary refill time to digits immediate b/l. Palpable pedal pulses b/l LE. Pedal hair present. Lower extremity skin temperature gradient within normal limits. No cyanosis or clubbing noted.  Neurologic Normal speech. Oriented to person, place, and time. Protective sensation intact 5/5 intact bilaterally with 10g monofilament b/l.  Vibratory sensation intact b/l. Proprioception intact bilaterally.  Dermatologic Pedal skin with normal turgor, texture and tone bilaterally. No open wounds bilaterally. No interdigital macerations bilaterally. Toenails L hallux elongated, discolored, dystrophic, thickened, and crumbly with subungual debris and tenderness to dorsal palpation. Small annular skin eruption noted on dorsal aspect of the left forefoot.  There is no surrounding blisters, no abscess.  There is no elevation nor induration..  Orthopedic: Normal muscle strength 5/5 to all lower extremity muscle groups bilaterally. No pain crepitus or joint limitation noted with ROM b/l. No gross bony deformities bilaterally. Patient ambulates independent of any assistive aids.   Radiographs: None Assessment:  No diagnosis found. Plan:  Patient was evaluated and treated and all questions answered.  Onychomycosis with pain -Nails palliatively debridement as below. -Educated on self-care  Procedure: Nail Debridement Rationale: Pain Type of Debridement:  manual, sharp debridement. Instrumentation: Nail nipper, rotary burr. Number of Nails: 1  -Examined patient. -Toenails L hallux debrided in length and girth without iatrogenic bleeding with sterile nail nipper and dremel.  -Progress of the left foot, patient was instructed to apply cortisone 10 cream twice daily until resolved.  Call if condition worsens.  This information was also related to her son.  He related understanding. -Patient to report any pedal injuries to medical professional immediately. -Patient to continue soft, supportive shoe gear daily. -Patient/POA to call should there be question/concern in the interim.  Return in about 3 months (around 03/25/2021).  Marzetta Board, DPM

## 2020-12-31 ENCOUNTER — Other Ambulatory Visit: Payer: Self-pay | Admitting: Allergy and Immunology

## 2021-01-30 ENCOUNTER — Ambulatory Visit (INDEPENDENT_AMBULATORY_CARE_PROVIDER_SITE_OTHER): Payer: Medicare Other | Admitting: Neurology

## 2021-01-30 ENCOUNTER — Other Ambulatory Visit: Payer: Self-pay

## 2021-01-30 ENCOUNTER — Encounter: Payer: Self-pay | Admitting: Neurology

## 2021-01-30 VITALS — BP 130/80 | HR 90 | Ht 62.0 in | Wt 129.6 lb

## 2021-01-30 DIAGNOSIS — R413 Other amnesia: Secondary | ICD-10-CM

## 2021-01-30 MED ORDER — MEMANTINE HCL 5 MG PO TABS: ORAL_TABLET | ORAL | 0 refills | Status: DC

## 2021-01-30 NOTE — Progress Notes (Signed)
Reason for visit: Memory disturbance  Referring physician: Dr. Pearlie Oyster [name] Dyer is a 87 y.o. female  History of present illness:  [name] Dyer is an 87 year old right-handed white female with a history of a slowly progressive memory disturbance that has been present for a number of years.  The patient was first seen through this office and 2017, she was having difficulty at that time with directions with driving.  She scored 28/30 on the Mini-Mental status examination.  The patient underwent MRI of the brain that showed fairly extensive small vessel disease and a chronic right occipital stroke event.  The patient is on chronic anticoagulation therapy.  She has been on Aricept taking 10 mg daily.  She does report some vivid dreams on the medication.  She recent was placed on Namenda, and could not tolerate the drug secondary to dizziness but she claims that she was started on 28 mg extended release medication daily.  She only took the medication for 4 days.  The patient currently lives at home, she is alone at this point as her husband has passed away.  She does not operate a motor vehicle.  She does not cook, her daughter-in-law makes meals for her and she will hit them up.  She takes pills from a pill dispenser and she writes the checks to pay the bills but requires some help with this.  She does require some help keeping up with appointments.  The patient has not had any falls but she does use a walker outside of the house.  The patient denies issues controlling the bowels or the bladder.  She does have some discomfort in the legs associated with neuropathy, she takes gabapentin for this.  She claims that she does fairly well with the neuropathy pain.  Past Medical History:  Diagnosis Date  . Anemia   . Arrhythmia   . Cataract   . Colon polyps   . Community acquired pneumonia Feb 2013   LLL  . Congestive heart failure ([place])   . Hyperlipidemia   . Hypertension   . Memory  difficulty 10/01/2016  . Osteoporosis   . Pleural effusion, right Feb 2013  . SVT (supraventricular tachycardia) ([place]) feb 2013  . UTI (lower urinary tract infection)     Past Surgical History:  Procedure Laterality Date  . ABDOMINAL HYSTERECTOMY  1983  . APPENDECTOMY  1983  . COLONOSCOPY    . COLONOSCOPY N/A 06/10/2016   Procedure: COLONOSCOPY;  Surgeon: Manus Gunning, MD;  Location: [facility];  Service: Gastroenterology;  Laterality: N/A;  . ESOPHAGOGASTRODUODENOSCOPY N/A 06/10/2016   Procedure: ESOPHAGOGASTRODUODENOSCOPY (EGD);  Surgeon: Manus Gunning, MD;  Location: [facility];  Service: Gastroenterology;  Laterality: N/A;  . HEMIARTHROPLASTY HIP  2011   Left femoral neck fracture  . UPPER GASTROINTESTINAL ENDOSCOPY      Family History  Problem Relation Age of Onset  . Colon polyps Son   . Stroke Father   . Cancer Grandchild     Social history:  reports that she has never smoked. She has never used smokeless tobacco. She reports that she does not drink alcohol and does not use drugs.  Medications:  Prior to Admission medications   Medication Sig Start Date End Date Taking? Authorizing Provider  acetaminophen (TYLENOL) 500 MG tablet Take 1,000 mg by mouth every 6 (six) hours as needed for mild pain or headache.    Yes [provider]  alendronate (FOSAMAX) 70 MG tablet Take 70 mg  by mouth every Monday.  09/30/16  Yes [provider]  apixaban (ELIQUIS) 2.5 MG TABS tablet Take 1 tablet (2.5 mg total) by mouth 2 (two) times daily. 03/23/20  Yes [name], MD  atenolol (TENORMIN) 25 MG tablet Take 1 tablet twice a day 06/20/20  Yes Burtis Junes, NP  betamethasone dipropionate 0.05 % lotion Apply topically. 11/30/20  Yes [provider]  carbamide peroxide (DEBROX) 6.5 % OTIC solution Debrox 6.5 % ear drops  INSTILL 5 DROPS INTO AFFECTED EAR(S) BY OTIC ROUTE 2 TIMES PER DAY  use as instructed   Yes [provider]   cholecalciferol (VITAMIN D3) 25 MCG (1000 UT) tablet Take 2,000 Units by mouth daily.   Yes [provider]  Cyanocobalamin (VITAMIN B-12) 5000 MCG TBDP Take 5,000 mcg by mouth daily.    Yes [provider]  donepezil (ARICEPT) 10 MG tablet Take 10 mg by mouth at bedtime.   Yes [provider]  estradiol (ESTRACE) 0.5 MG tablet Take 0.25 mg by mouth every other day.  02/26/18  Yes [provider]  Fluocinolone Acetonide Scalp (DERMA-SMOOTHE/FS SCALP) 0.01 % OIL Apply to scalp after shower once daily one-seven times weekly 03/16/20  Yes Kozlow, Donnamarie Poag, MD  furosemide (LASIX) 40 MG tablet Take 1 tablet (40 mg total) by mouth daily. If weight increase by 2 pounds overnight or leg swelling increase- take 1.5 tablets (60 mg) 05/30/20  Yes [name], MD  gabapentin (NEURONTIN) 300 MG capsule Take 300 mg by mouth 3 (three) times daily.   Yes [provider]  halobetasol (ULTRAVATE) 0.05 % cream APPLY A THIN LAYER TO RED, SCALY AREAS AS DIRECTED UP TO 3 TIMES A WEEK AS NEEDED, AS DIRECTED 03/16/20  Yes Kozlow, Donnamarie Poag, MD  ipratropium (ATROVENT) 0.06 % nasal spray CAN USE 2 SPRAYS IN EACH NOSTRIL EVERY 6 HOURS IF NEEDED TO DRY UP RUNNY N0SE 12/04/20  Yes Kozlow, Donnamarie Poag, MD  Menthol, Topical Analgesic, (BIOFREEZE EX) Apply topically.   Yes [provider]  potassium chloride (KLOR-CON) 10 MEQ tablet Take 10 mEq by mouth 2 (two) times daily. 12/08/20  Yes [provider]  simvastatin (ZOCOR) 10 MG tablet Take 10 mg by mouth at bedtime.  02/15/18  Yes [provider]  traZODone (DESYREL) 50 MG tablet Take by mouth. 08/08/20  Yes [provider]  memantine (NAMENDA XR) 14 MG CP24 24 hr capsule Take by mouth. 10/18/20   [provider]      Allergies  Allergen Reactions  . Ciprofloxacin Diarrhea  . Sulfa Antibiotics Hives and Rash    Severe Rash  . Memantine Other (See Comments)    dizziness  . Succinylsulphathiazole  Rash  . Sulfamethoxazole Rash    ROS:  Out of a complete 14 system review of symptoms, the patient complains only of the following symptoms, and all other reviewed systems are negative.  Memory problems Walking difficulty Leg pain  Blood pressure 130/80, pulse 90, height _0  (1.575 m), weight 129 lb 9.6 oz (58.8 kg).  Physical Exam  General: The patient is alert and cooperative at the time of the examination.  Eyes: Pupils are equal, round, and reactive to light. Discs are flat bilaterally.  Neck: The neck is supple, no carotid bruits are noted.  Respiratory: The respiratory examination is clear.  Cardiovascular: The cardiovascular examination reveals a regular rate and rhythm, no obvious murmurs or rubs are noted.  Skin: Extremities are without significant edema.  Neurologic Exam  Mental status: The patient is alert and oriented x 3 at the time of the examination. The Mini-Mental status examination done today shows a total score 24/30.  Cranial nerves: Facial symmetry is present. There is good sensation of the face to pinprick and soft touch bilaterally. The strength of the facial muscles and the muscles to head turning and shoulder shrug are normal bilaterally. Speech is well enunciated, no aphasia or dysarthria is noted. Extraocular movements are full. Visual fields are full. The tongue is midline, and the patient has symmetric elevation of the soft palate. No obvious hearing deficits are noted.  Motor: The motor testing reveals 5 over 5 strength of all 4 extremities. Good symmetric motor tone is noted throughout.  Sensory: Sensory testing is intact to pinprick, soft touch, vibration sensation, and position sense on all 4 extremities, with exception some decreased pinprick sensation on the left arm. No evidence of extinction is noted.  Coordination: Cerebellar testing reveals good finger-nose-finger and heel-to-shin bilaterally.  Gait and station: Gait is associated with  a short shuffling type gait.  The patient can walk independently.  Tandem gait is minimally unsteady.  Romberg is negative. No drift is seen.  Reflexes: Deep tendon reflexes are symmetric, but are depressed bilaterally. Toes are downgoing bilaterally.   Assessment/Plan:  1.  Chronic memory disturbance  2.  Cerebrovascular disease by MRI brain  The patient will be retried on Namenda, but we will start on a much lower dose taking 5 mg daily and working up by 5 mg each week.  If the patient cannot tolerate the drug, she is to contact our office.  The patient will remain on Aricept, which she may take the dose in the morning as she is having vivid dreams at night.  She may add alpha lipoic acid 600 mg daily for the neuropathy.  She will follow up here in 6 months.  Jill Alexanders MD 01/30/2021 11:06 AM  [facility] [address] [place], [place] [address]  Phone [phone] Fax [phone]

## 2021-01-30 NOTE — Patient Instructions (Signed)
We will start namenda tablets, working up slowly on the dose.  May start alpha lipoic acid 600 mg a day for the neuropathy.

## 2021-02-12 ENCOUNTER — Telehealth: Payer: Self-pay | Admitting: Internal Medicine

## 2021-02-12 NOTE — Telephone Encounter (Signed)
   [facility] Pre-operative Risk Assessment    HEARTCARE STAFF: - Please ensure there is not already an duplicate clearance open for this procedure. - Under Visit Info/Reason for Call, type in Other and utilize the format Clearance MM/DD/YY or Clearance TBD. Do not use dashes or single digits. - If request is for dental extraction, please clarify the # of teeth to be extracted.  Request for surgical clearance:  1. What type of surgery is being performed? Tooth Extraction of #1  2. When is this surgery scheduled? TBD   3. What type of clearance is required (medical clearance vs. Pharmacy clearance to hold med vs. Both)? Both  4. Are there any medications that need to be held prior to surgery and how long? Eliquis   5. Practice name and name of physician performing surgery? [facility]   6. What is the office phone number?  [phone]   7.   What is the office fax number? [phone]  8.   Anesthesia type (None, local, MAC, general) ? local  _________________________________________________________________   (provider comments below)

## 2021-02-13 NOTE — Telephone Encounter (Signed)
   Primary Cardiologist: [name], MD  Chart reviewed as part of pre-operative protocol coverage.   Simple dental extractions are considered low risk procedures per guidelines and generally do not require any specific cardiac clearance. It is also generally accepted that for simple extractions and dental cleanings, there is no need to interrupt blood thinner therapy.  Recommendations  Proceed with simple dental extraction (1 tooth).  Continue Apixaban (Eliquis) without interruption  SBE prophylaxis is not required for the patient from a cardiac standpoint.  Please call with questions. Richardson Dopp, PA-C 02/13/2021, 12:43 PM

## 2021-02-13 NOTE — Telephone Encounter (Signed)
Notes faxed to surgeon. This phone note will be removed from the preop pool. Richardson Dopp, PA-C  02/13/2021 12:46 PM

## 2021-02-14 ENCOUNTER — Encounter (INDEPENDENT_AMBULATORY_CARE_PROVIDER_SITE_OTHER): Payer: Self-pay | Admitting: Otolaryngology

## 2021-02-14 ENCOUNTER — Ambulatory Visit (INDEPENDENT_AMBULATORY_CARE_PROVIDER_SITE_OTHER): Payer: Medicare Other | Admitting: Otolaryngology

## 2021-02-14 ENCOUNTER — Other Ambulatory Visit: Payer: Self-pay

## 2021-02-14 VITALS — Temp 97.7°F

## 2021-02-14 DIAGNOSIS — R519 Headache, unspecified: Secondary | ICD-10-CM

## 2021-02-14 DIAGNOSIS — H6123 Impacted cerumen, bilateral: Secondary | ICD-10-CM

## 2021-02-14 DIAGNOSIS — J310 Chronic rhinitis: Secondary | ICD-10-CM

## 2021-02-14 NOTE — Progress Notes (Addendum)
HPI: [name] Dyer is a 87 y.o. female who presents is referred by her PCP for evaluation of her "sinuses".  She complains of swelling of the left cheek area.  She also feels that the nose is offset.  She complains of sinus issues worse on the left side.  She has occasional headaches and pain in her head and face.  She also complains of nasal discharge which is generally clear and has been prescribed Atrovent nasal spray.  She also occasionally has pain in the right ear and has difficulty lying on the right ear.  The drainage from her nose is generally clear. I reviewed a MRI scan that was performed in 2018 that showed clear paranasal sinuses.. She used to see Dr. Ernesto Rutherford I do not have his records.  Past Medical History:  Diagnosis Date  . Anemia   . Arrhythmia   . Cataract   . Colon polyps   . Community acquired pneumonia Feb 2013   LLL  . Congestive heart failure ([place])   . Hyperlipidemia   . Hypertension   . Memory difficulty 10/01/2016  . Osteoporosis   . Pleural effusion, right Feb 2013  . SVT (supraventricular tachycardia) ([place]) feb 2013  . UTI (lower urinary tract infection)    Past Surgical History:  Procedure Laterality Date  . ABDOMINAL HYSTERECTOMY  1983  . APPENDECTOMY  1983  . COLONOSCOPY    . COLONOSCOPY N/A 06/10/2016   Procedure: COLONOSCOPY;  Surgeon: Manus Gunning, MD;  Location: [facility];  Service: Gastroenterology;  Laterality: N/A;  . ESOPHAGOGASTRODUODENOSCOPY N/A 06/10/2016   Procedure: ESOPHAGOGASTRODUODENOSCOPY (EGD);  Surgeon: Manus Gunning, MD;  Location: [facility];  Service: Gastroenterology;  Laterality: N/A;  . HEMIARTHROPLASTY HIP  2011   Left femoral neck fracture  . UPPER GASTROINTESTINAL ENDOSCOPY     Social History   Socioeconomic History  . Marital status: Widowed    Spouse name: Not on file  . Number of children: 2  . Years of education: 63  . Highest education level: Not on file  Occupational History  .  Occupation: Retired  Tobacco Use  . Smoking status: Never Smoker  . Smokeless tobacco: Never Used  Vaping Use  . Vaping Use: Never used  Substance and Sexual Activity  . Alcohol use: No  . Drug use: No  . Sexual activity: Not on file  Other Topics Concern  . Not on file  Social History Narrative   Lives alone, 2 sons take care of her   Right-handed   Caffeine: Cheerwine or hot chocolate occasionally but otherwise none   Social Determinants of Radio broadcast assistant Strain: Not on file  Food Insecurity: Not on file  Transportation Needs: Not on file  Physical Activity: Not on file  Stress: Not on file  Social Connections: Not on file   Family History  Problem Relation Age of Onset  . Colon polyps Son   . Stroke Father   . Cancer Grandchild    Allergies  Allergen Reactions  . Ciprofloxacin Diarrhea  . Sulfa Antibiotics Hives and Rash    Severe Rash  . Memantine Other (See Comments)    dizziness  . Succinylsulphathiazole Rash  . Sulfamethoxazole Rash   Prior to Admission medications   Medication Sig Start Date End Date Taking? Authorizing Provider  acetaminophen (TYLENOL) 500 MG tablet Take 1,000 mg by mouth every 6 (six) hours as needed for mild pain or headache.     [provider]  alendronate (  FOSAMAX) 70 MG tablet Take 70 mg by mouth every Monday.  09/30/16   [provider]  apixaban (ELIQUIS) 2.5 MG TABS tablet Take 1 tablet (2.5 mg total) by mouth 2 (two) times daily. 03/23/20   [name], MD  atenolol (TENORMIN) 25 MG tablet Take 1 tablet twice a day 06/20/20   Burtis Junes, NP  betamethasone dipropionate 0.05 % lotion Apply topically. 11/30/20   [provider]  carbamide peroxide (DEBROX) 6.5 % OTIC solution Debrox 6.5 % ear drops  INSTILL 5 DROPS INTO AFFECTED EAR(S) BY OTIC ROUTE 2 TIMES PER DAY  use as instructed    [provider]  cholecalciferol (VITAMIN D3) 25 MCG (1000 UT) tablet Take 2,000 Units by mouth  daily.    [provider]  Cyanocobalamin (VITAMIN B-12) 5000 MCG TBDP Take 5,000 mcg by mouth daily.     [provider]  donepezil (ARICEPT) 10 MG tablet Take 10 mg by mouth at bedtime.    [provider]  estradiol (ESTRACE) 0.5 MG tablet Take 0.25 mg by mouth every other day.  02/26/18   [provider]  Fluocinolone Acetonide Scalp (DERMA-SMOOTHE/FS SCALP) 0.01 % OIL Apply to scalp after shower once daily one-seven times weekly 03/16/20   Kozlow, Donnamarie Poag, MD  furosemide (LASIX) 40 MG tablet Take 1 tablet (40 mg total) by mouth daily. If weight increase by 2 pounds overnight or leg swelling increase- take 1.5 tablets (60 mg) 05/30/20   [name], MD  gabapentin (NEURONTIN) 300 MG capsule Take 300 mg by mouth 3 (three) times daily.    [provider]  halobetasol (ULTRAVATE) 0.05 % cream APPLY A THIN LAYER TO RED, SCALY AREAS AS DIRECTED UP TO 3 TIMES A WEEK AS NEEDED, AS DIRECTED 03/16/20   Kozlow, Donnamarie Poag, MD  ipratropium (ATROVENT) 0.06 % nasal spray CAN USE 2 SPRAYS IN EACH NOSTRIL EVERY 6 HOURS IF NEEDED TO DRY UP RUNNY N0SE 12/04/20   Kozlow, Donnamarie Poag, MD  memantine (NAMENDA) 5 MG tablet Take 1 tablet daily for one week, then take 1 tablet twice daily for one week, then take 1 tablet in the morning and 2 in the evening for one week, then take 2 tablets twice daily 01/30/21   [name], MD  Menthol, Topical Analgesic, (BIOFREEZE EX) Apply topically.    [provider]  potassium chloride (KLOR-CON) 10 MEQ tablet Take 10 mEq by mouth 2 (two) times daily. 12/08/20   [provider]  simvastatin (ZOCOR) 10 MG tablet Take 10 mg by mouth at bedtime.  02/15/18   [provider]  traZODone (DESYREL) 50 MG tablet Take by mouth. 08/08/20   [provider]     Positive ROS: Otherwise negative  All other systems have been reviewed and were otherwise negative with the exception of those mentioned in the HPI and as  above.  Physical Exam: Constitutional: Alert, well-appearing, no acute distress Ears: External ears without lesions or tenderness.  She had a large amount of wax in both ear canals that was cleaned with forceps and curettes.  There is no inflammatory changes within the ear canals.  TMs were clear bilaterally. Nasal: External nose without lesions. Septum slightly deviated to the right with mild rhinitis.  Both middle meatus regions were clear with no evidence of mucopurulent discharge.  No polyps and minimal edema..  Patient was able to breathe comfortably through both nostrils.  No clinical evidence of active sinus infection. Facial  exam: She has no swelling or erythema of her face.  Perhaps slight more fullness in the left cheek than the right cheek but this is not tender to palpation and no erythema noted. Oral: Lips and gums without lesions. Tongue and palate mucosa without lesions. Posterior oropharynx clear. Neck: No palpable adenopathy or masses Respiratory: Breathing comfortably  Skin: No facial/neck lesions or rash noted.  Cerumen impaction removal  Date/Time: 02/14/2021 5:00 PM Performed by: Rozetta Nunnery, MD Authorized by: Rozetta Nunnery, MD   Consent:    Consent obtained:  Verbal   Consent given by:  Patient   Risks discussed:  Pain and bleeding Procedure details:    Location:  L ear and R ear   Procedure type: curette and forceps   Post-procedure details:    Inspection:  TM intact and canal normal   Hearing quality:  Improved   Patient tolerance of procedure:  Tolerated well, no immediate complications Comments:     TMs are clear bilaterally.    Assessment: Chronic rhinitis with no clinical evidence of active sinus infection. Headaches questionable etiology Wax buildup in both ear canals.  This was cleaned in the office.  Plan: For the nasal drainage recommended use of either saline irrigation or Nasacort and gave her a prescription for Nasacort.   Apparently she is used Flonase in the past. She will follow-up as needed.   Radene Journey, MD   CC:

## 2021-02-23 ENCOUNTER — Other Ambulatory Visit: Payer: Self-pay | Admitting: Neurology

## 2021-02-27 ENCOUNTER — Telehealth (INDEPENDENT_AMBULATORY_CARE_PROVIDER_SITE_OTHER): Payer: Self-pay | Admitting: Otolaryngology

## 2021-02-27 ENCOUNTER — Telehealth: Payer: Self-pay | Admitting: Neurology

## 2021-02-27 NOTE — Telephone Encounter (Signed)
[name] Dyer called today because of chronic runny nose.  She has been using Nasacort and did not feel like this helps and wanted to know if she can stop it.  I discussed that she can stop Nasacort easily by just stopping it cold and use it as needed. Also for the runny nose she could try antihistamine is suggested trying either Claritin or Allegra. Also discussed with her concerning using nasal saline irrigation to help with chronic runny nose.

## 2021-02-27 NOTE — Addendum Note (Signed)
Addended by: [name] on: 02/27/2021 05:49 PM   Modules accepted: Orders

## 2021-02-27 NOTE — Telephone Encounter (Signed)
I called the patient and talk with his son.  The patient is having a lot of dizziness and mental fogginess on Namenda, they are to stop the medication.

## 2021-02-27 NOTE — Telephone Encounter (Signed)
Pt.'s son [name] is on [name]. He states that because of memantine (NAMENDA) 5 MG tablet mom is dizzy, not able to walk too good & has brain fog. Please advise.

## 2021-03-01 ENCOUNTER — Telehealth: Payer: Self-pay | Admitting: Internal Medicine

## 2021-03-01 NOTE — Telephone Encounter (Signed)
Patient saw the dentist again today.  He said everything was ok but had 2 episodes of bleeding since then.  No bleeding at all today that she can tell.  She wanted to know if she could skip any dose of Eliquis.   I adv to not skip any doses as the risk of stroke would go up.    Does not have to go back to the dentist for follow up unless something new comes up.   Started antibiotics yesterday for this.    She is shiverering, jerking, hands and arms.  It is not new and not all the time.  I adv to follow up with neuro about this.

## 2021-03-01 NOTE — Telephone Encounter (Signed)
Pt called in and stated that she had tooth removed and it has been 11 days .  It is still kinda bleeding.  The dentist told her to call her Dr Harrington Challenger to see if she she could go off of her lasix for a few days?    She so wanted to speak with DR Harrington Challenger nurse because she said that she is "sivering again"  She stated she had this before .  Pt denies no other symptoms      Best number [phone]

## 2021-03-04 NOTE — Telephone Encounter (Signed)
Returned patient's son call.  He believes she can tolerate the 5 mg namenda 2xday and wants to keep her on it as long as she can as he thinks it will help her longer than not taking it all.  She also has been taking antibiotics, he is going to give her 5 mg 2xday until she is off the antibiotics as long as she is tolerating it and then try the 10 mg to see if it was the antibiotics interfering.

## 2021-03-04 NOTE — Telephone Encounter (Signed)
[name] son, Kamari Bilek called, is a lower dose of  Namenda that does not make her dizzy, better than nothing.  Would like a call from the nurse.

## 2021-03-04 NOTE — Telephone Encounter (Signed)
Noted, if the patient can tolerate 5 mg twice daily, it would be better than nothing.

## 2021-03-07 ENCOUNTER — Telehealth: Payer: Self-pay | Admitting: Internal Medicine

## 2021-03-07 MED ORDER — RIVAROXABAN 15 MG PO TABS: 15.0000 mg | ORAL_TABLET | Freq: Every day | ORAL | 0 refills | Status: DC

## 2021-03-07 MED ORDER — RIVAROXABAN 15 MG PO TABS: 15.0000 mg | ORAL_TABLET | Freq: Every day | ORAL | 3 refills | Status: DC

## 2021-03-07 NOTE — Telephone Encounter (Signed)
Spoke with pt and confirmed she has 24 eliquis tablets left.    I confirmed her Xarelto rx should go to [facility] and that she only wants 7 pills to start with to make sure she tolerates them.  I sent a prescription for Xarelto 15 mg daily (7 tabs) and a second one for 90 day supply.  I called [facility] and was informed the cost for Xarelto for pt is Zero.     The pharmacist is going to request to speak with whoever pics up the medication to make sure everyone understands to switch from Eliquis to Xarelto and that it is only daily.  The patient verballizes understanding of this instruction but also frequently states how forgetful she is while we are speaking.  I offered to call one of her sons.  She told me they know what's going on with this.

## 2021-03-07 NOTE — Telephone Encounter (Signed)
    Pt c/o medication issue:  1. Name of Medication:   apixaban (ELIQUIS) 2.5 MG TABS tablet    2. How are you currently taking this medication (dosage and times per day)? Take 1 tablet (2.5 mg total) by mouth 2 (two) times daily.  3. Are you having a reaction (difficulty breathing--STAT)?   4. What is your medication issue? Pt calling about her eliquis, she said she talk about it with Michalene, her eliquis increased the cost to $200. She wanted to know if Dr. Harrington Challenger will give her a different prescription. She said as of this afternoon she only have 24 pills left

## 2021-03-12 ENCOUNTER — Telehealth: Payer: Self-pay | Admitting: Internal Medicine

## 2021-03-12 NOTE — Telephone Encounter (Signed)
Pt c/o medication issue:  1. Name of Medication: Pardaxa  2. How are you currently taking this medication (dosage and times per day)?   3. Are you having a reaction (difficulty breathing--STAT)?   4. What is your medication issue? Patient wants to know if Pardaxa is better medication to take rather then Xarelto. Her primary dr as prescribe the Pardaxa for her.   Patient also has concerns bout weight gain she is experiencing. Patient stats she weighed 122 a week ago but now weight 125.8. Also she is starting to shiver and her hands are starting to shake real bad.

## 2021-03-12 NOTE — Telephone Encounter (Signed)
Patient reports her weight is up 3-4 pounds this week.  She takes lasix 40 mg alternating with 60 mg every 3rd day.  She will take the 60 mg dose two days in a row this week and go back to her regular schedule and will continue to monitor weights.     She is going to switch to Daily Xarelto this week, knows to stop eliquis.  Wanted to discuss pradaxa as recommended by PCP.  I told her to start Xarelto as planned and I would let Dr. Harrington Challenger know.   Rec again that she speak w PCP or neuro re: tremors/shaking/shivering

## 2021-03-13 NOTE — Telephone Encounter (Signed)
New Message:     Pt says she needs a call from [facility] please. She needs to talk to her about her Xarelto. She thought that her Xarelto was supposed to be free, when she went to pick it up yesterday, it was  $92. For 7 days.

## 2021-03-13 NOTE — Telephone Encounter (Signed)
I adv the patient to call the number on the back of her card to see if getting the medication through [facility] would cost less.   Will route to [place] in patient assistance to see if there are any other ways to help the patient.

## 2021-03-14 NOTE — Telephone Encounter (Signed)
I was on this call for more than 20 mins trying to discuss [name] Dyer and [name] Dyer pt asst with the pt but she seems unwilling to call them and keeps repeating "I dont know what Im going to do". She did take [facility] phone number but based on our conversation she may not be able to make this call. I asked if she has anyone that can help her and she stated her son will help her. I asked her to have her son call Jeani Hawking at Dr Alan Ripper office [phone] so he and I can discuss.   I asked if she would be willing to switch to generic Warfarin but she is not interested due to the frequent INR checks.  I also attempted to explain to her how a deductible works as she does state that she owes one but I was not successful so I advised her to contact her Ins plan for details. She seems unwilling to do this either.  I will await a call from her son.

## 2021-03-14 NOTE — Telephone Encounter (Signed)
**Note De-Identified  Obfuscation** I have started a Xarelto tier exception over the phone with [place] at [facility] at [phone].  Per [employer] will fax [place] a determination letter within 24 to 72 hrs. [id number]

## 2021-03-14 NOTE — Telephone Encounter (Signed)
**Note De-Identified  Obfuscation** I called the pts son [name] to discuss options for the pts Xarelto cost as follows:  1. Call [facility] at [phone] to ask questions about their Xarelto program and the pts eligibility to be approved (he is aware that if she is approved she will receive her Xarelto for the rest of this year free of charge from [facility]). He is aware to request that [facility] mail him an application and that once it is received to complete the pt part of the application, obtain required documents per [facility] (if any), and to bring all to Dr Alan Ripper office on [address] in [place] to drop off and that we will handle the provider page and will fax all to [facility] Pt Asst Application.  2. Call the pts ins provider to ask if using their mail pharmacy would be less expensive for the pts Xarelto.  3. Switch to Warfain  4. Once deductible is met her cost for Xarelto will come down.  He states that he will contact [facility] now and will call me if he needs my assistance.

## 2021-03-19 NOTE — Telephone Encounter (Signed)
[name] Dyer is calling stating he plans to bring the forms to the office either today or tomorrow in regards to this. Please advise.

## 2021-03-19 NOTE — Telephone Encounter (Signed)
**Note De-Identified  Obfuscation** [name] states that he has the pts completed [facility] application all together and just needed to know how to get it to me. I advised him to give it to the person sitting at check in at the front desk at Dr Alan Ripper office at [facility] on [address] in [place] and to advise them that this is the pts asst application and to please be sure Jeani Hawking gets it.  He verbalized understanding and thanked me for returning his call.

## 2021-03-22 NOTE — Telephone Encounter (Signed)
**Note De-Identified  Obfuscation** The pts [place] pt asst application for Xarelto was left at the office. I have completed the MD page of the application and emailed all to Dr Alan Ripper nurse so she can obtain her signature, date it, and to fax all to [facility] at the fax number written on cover letter included or to take to Medical Records to place in the nurses box to be faxed.

## 2021-03-26 NOTE — Telephone Encounter (Signed)
Signed patient assistance form faxed to [employer] at this time.  Confirmation received.

## 2021-03-27 ENCOUNTER — Ambulatory Visit (INDEPENDENT_AMBULATORY_CARE_PROVIDER_SITE_OTHER): Payer: Medicare Other | Admitting: Podiatry

## 2021-03-27 ENCOUNTER — Encounter: Payer: Self-pay | Admitting: Podiatry

## 2021-03-27 ENCOUNTER — Other Ambulatory Visit: Payer: Self-pay

## 2021-03-27 DIAGNOSIS — M79676 Pain in unspecified toe(s): Secondary | ICD-10-CM | POA: Diagnosis not present

## 2021-03-27 DIAGNOSIS — B351 Tinea unguium: Secondary | ICD-10-CM

## 2021-03-27 NOTE — Patient Instructions (Signed)
Recommend Skechers Loafers with stretchable uppers and memory foam insoles. They can be purchased at [employer], [employer] or [employer]. Also on [email or web address].

## 2021-03-27 NOTE — Progress Notes (Signed)
  Subjective:  Patient ID: [name] Dyer, female    DOB: September 30, 1932,  MRN: [id number]  87 y.o. female presents with painful thick toenail that is difficult to trim. Pain interferes with ambulation. Aggravating factors include wearing enclosed shoe gear. Pain is relieved with periodic professional debridement..    Patient is requesting shoe recommendations on today's visit.  Review of Systems: Negative except as noted in the HPI.   Allergies  Allergen Reactions  . Ciprofloxacin Diarrhea  . Sulfa Antibiotics Hives and Rash    Severe Rash  . Memantine Other (See Comments)    dizziness  . Succinylsulphathiazole Rash  . Sulfamethoxazole Rash   Social History   Occupational History  . Occupation: Retired  Tobacco Use  . Smoking status: Never Smoker  . Smokeless tobacco: Never Used  Vaping Use  . Vaping Use: Never used  Substance and Sexual Activity  . Alcohol use: No  . Drug use: No  . Sexual activity: Not on file    Objective:   Constitutional Pt is a pleasant 87 y.o. Caucasian female WD, WN in NAD.Marland Kitchen AAO x 3.   Vascular Capillary refill time to digits immediate b/l. Palpable pedal pulses b/l LE. Pedal hair present. Lower extremity skin temperature gradient within normal limits. No cyanosis or clubbing noted.  Neurologic Normal speech. Oriented to person, place, and time. Protective sensation intact 5/5 intact bilaterally with 10g monofilament b/l. Vibratory sensation intact b/l. Proprioception intact bilaterally.  Dermatologic Pedal skin with normal turgor, texture and tone bilaterally. No open wounds bilaterally. No interdigital macerations bilaterally. Toenails L hallux elongated, discolored, dystrophic, thickened, and crumbly with subungual debris and tenderness to dorsal palpation.   Orthopedic: Normal muscle strength 5/5 to all lower extremity muscle groups bilaterally. No pain crepitus or joint limitation noted with ROM b/l. No gross bony deformities bilaterally. Patient  ambulates independent of any assistive aids.   Radiographs: None Assessment:   1. Pain due to onychomycosis of toenail    Plan:  Patient was evaluated and treated and all questions answered.  Onychomycosis with pain -Nails palliatively debridement as below. -Educated on self-care  Procedure: Nail Debridement Rationale: Pain Type of Debridement: manual, sharp debridement. Instrumentation: Nail nipper, rotary burr. Number of Nails: 1  -Examined patient. -Toenails L hallux debrided in length and girth without iatrogenic bleeding with sterile nail nipper and dremel.  -Dispensed written shoe recommendations for Skechers shoes with stretchable uppers and memory foam insole. -This information was also related to her son.  He related understanding. -Patient to report any pedal injuries to medical professional immediately. -Patient to continue soft, supportive shoe gear daily. -Patient/POA to call should there be question/concern in the interim.  Return in about 3 months (around 06/27/2021).  Marzetta Board, DPM

## 2021-04-15 ENCOUNTER — Telehealth: Payer: Self-pay | Admitting: Internal Medicine

## 2021-04-15 NOTE — Telephone Encounter (Signed)
I called [facility] to make sure they get the Xarelto 15 mg full prescription ready for pick up.  The patient has also been called.  I let her know that it is a full prescription that she needs, not a higher dose.  Reminded it is only once daily.  Her sons pick up her medicines and help her take them correctly.

## 2021-04-15 NOTE — Telephone Encounter (Signed)
Called and went over dosing with patient's son.   Patient denied further questions, verbalized understanding and expressed appreciation for the phone call.

## 2021-04-15 NOTE — Telephone Encounter (Signed)
Pt c/o medication issue:  1. Name of Medication: Rivaroxaban (XARELTO) 15 MG TABS tablet  2. How are you currently taking this medication (dosage and times per day)? As directed  3. Are you having a reaction (difficulty breathing--STAT)? no  4. What is your medication issue? Patient states that she is tolerating this medication well and thinks she is ready to go to a higher dose. She [id number] [place] [place] by Dr. RHarrington Challengerto increase her dosage.

## 2021-04-15 NOTE — Telephone Encounter (Signed)
[name] son, Sarita Hakanson (on [name]) called, questions about starting her back on memantine. Would like a call from the nurse.

## 2021-05-13 ENCOUNTER — Telehealth: Payer: Self-pay | Admitting: Internal Medicine

## 2021-05-13 NOTE — Telephone Encounter (Signed)
Pt called to confirm dose of lasix, because she was using 20 mg and 40 mg tablets.  Now she is only getting 40 mg tabs from pharmacy.  She is taking lasix 40 mg daily with and and extra half tablet every 3rd day, which is how it is ordered.  But she was using 20 mg and taking BID.  We reviewed and she wrote a note on what to do w the 40 mg tablets.  Her son helps w her pills.  She reports "my memory is getting bad, i'm on this medicine to help things slow down but it makes me sick"  She also reports that she "pants" when she exerts herself.  Others notice it.  She does not feel bad or out of breath and doesn't know why it happens.   Her 6 month f/u is due this month.  I have moved her to Friday 05/17/21 w Dr. Harrington Challenger. She will call back if her son cannot bring her.

## 2021-05-13 NOTE — Telephone Encounter (Signed)
Patient is requesting to speak with Dr. Harrington Challenger' nurse regarding her medications. She declined going into detail and states she will discuss further when she receives a call back.

## 2021-05-16 ENCOUNTER — Telehealth: Payer: Self-pay | Admitting: Neurology

## 2021-05-16 NOTE — Telephone Encounter (Signed)
Spoke to patient's son [name]).  He said she had tried it before several months ago and said it made her so groggy she couldn't function.  She is trying it again (she is on 3 pills) and has been on 3 pills longer than a week trying to acclimate her to it.    He stated it is making her symptoms worse and she is more groggy and increased confusion.  It is having the opposite effect.  2 pills she was functional, however, did not necessarily see an improvement but not a decline either, 3 pills she is much worse.  Aware Dr. Jannifer Franklin is not in this week and will get back to him once Dr. Jannifer Franklin responds.  Patient denied further questions, verbalized understanding and expressed appreciation for the phone call.

## 2021-05-16 NOTE — Telephone Encounter (Signed)
Called and spoke to son [name] again.  Explained Sarah's recommendation.  He understood that even 5 mg twice a day tolerated without negative effects is better than nothing and if he sees she is having any, he will discontinue the medicaiton all together.    Patient denied further questions, verbalized understanding and expressed appreciation for the phone call.

## 2021-05-16 NOTE — Telephone Encounter (Signed)
[name] son, Zoria Rawlinson called, she's having some issues with Memantine. She's experiencing some confusion more than normal.  Would like a call from the nurse.

## 2021-05-16 NOTE — Telephone Encounter (Signed)
If she can tolerate 2 pills ( 5 mg twice daily), this is better than nothing, but if the Namenda all together makes her feel worse she should stop it. Dr. Jannifer Franklin has had 2 calls with them about this issue already. Unfortunately, Namenda is not a magic pill to make the memory perfect.

## 2021-05-17 ENCOUNTER — Other Ambulatory Visit: Payer: Self-pay

## 2021-05-17 ENCOUNTER — Encounter: Payer: Self-pay | Admitting: Internal Medicine

## 2021-05-17 ENCOUNTER — Ambulatory Visit (INDEPENDENT_AMBULATORY_CARE_PROVIDER_SITE_OTHER): Payer: Medicare Other | Admitting: Internal Medicine

## 2021-05-17 VITALS — BP 118/54 | HR 81 | Ht 64.5 in | Wt 133.2 lb

## 2021-05-17 DIAGNOSIS — I4821 Permanent atrial fibrillation: Secondary | ICD-10-CM | POA: Diagnosis not present

## 2021-05-17 DIAGNOSIS — I5032 Chronic diastolic (congestive) heart failure: Secondary | ICD-10-CM

## 2021-05-17 DIAGNOSIS — R0609 Other forms of dyspnea: Secondary | ICD-10-CM

## 2021-05-17 DIAGNOSIS — R0600 Dyspnea, unspecified: Secondary | ICD-10-CM | POA: Diagnosis not present

## 2021-05-17 NOTE — Progress Notes (Signed)
Cardiology Office Note   Date:  05/17/2021   ID:  [name] Dyer, DOB 1932-12-29, MRN [id number]  PCP:  [name] Cuff, MD  Cardiologist:   [name], MD   Pt presents for f/u of permanent afib and diastolic CHF      History of Present Illness: [name] Dyer is a 87 y.o. female with a history of atrail  Fibrillation, chronic diastolic CHF, mitral regurgitation, HTN and HL   He was admitted in Feb 2021 with CHF   Echo showed LVEF 55 to 60%; mild to mod MR    I last saw the pt in Nov 2021 The pt has called in  [facility] is with son today   She says she has been gaining weight 119 in Sept 2021; 129 now.   Eating less   Watching salt carefullly   She denies CP   No dizziness  Current Meds  Medication Sig  . acetaminophen (TYLENOL) 500 MG tablet Take 1,000 mg by mouth every 6 (six) hours as needed for mild pain or headache.   . alendronate (FOSAMAX) 70 MG tablet Take 70 mg by mouth every Monday.   Marland Kitchen atenolol (TENORMIN) 25 MG tablet Take 1 tablet twice a day  . betamethasone dipropionate 0.05 % lotion Apply topically.  . cholecalciferol (VITAMIN D3) 25 MCG (1000 UT) tablet Take 2,000 Units by mouth daily.  . Cyanocobalamin (VITAMIN B-12) 5000 MCG TBDP Take 5,000 mcg by mouth daily.   Marland Kitchen donepezil (ARICEPT) 10 MG tablet Take 10 mg by mouth at bedtime.  Marland Kitchen estradiol (ESTRACE) 0.5 MG tablet Take 0.25 mg by mouth every other day.   . Fluocinolone Acetonide Scalp (DERMA-SMOOTHE/FS SCALP) 0.01 % OIL Apply to scalp after shower once daily one-seven times weekly  . furosemide (LASIX) 40 MG tablet Take 1 tablet (40 mg total) by mouth daily. If weight increase by 2 pounds overnight or leg swelling increase- take 1.5 tablets (60 mg) (Patient taking differently: Take 40 mg by mouth daily. If weight increase by 2 pounds overnight or leg swelling increase- take 1.5 tablets (60 mg))  . gabapentin (NEURONTIN) 300 MG capsule Take 300 mg by mouth 3 (three) times daily.  . halobetasol (ULTRAVATE) 0.05 %  cream APPLY A THIN LAYER TO RED, SCALY AREAS AS DIRECTED UP TO 3 TIMES A WEEK AS NEEDED, AS DIRECTED  . ipratropium (ATROVENT) 0.06 % nasal spray CAN USE 2 SPRAYS IN EACH NOSTRIL EVERY 6 HOURS IF NEEDED TO DRY UP RUNNY N0SE  . memantine (NAMENDA) 5 MG tablet Take 15 mg by mouth daily.  . Menthol, Topical Analgesic, (BIOFREEZE EX) Apply topically.  . potassium chloride (KLOR-CON) 10 MEQ tablet Take 10 mEq by mouth 2 (two) times daily.  . Rivaroxaban (XARELTO) 15 MG TABS tablet Take 1 tablet (15 mg total) by mouth daily with supper.  . simvastatin (ZOCOR) 10 MG tablet Take 10 mg by mouth at bedtime.   . traZODone (DESYREL) 50 MG tablet Take by mouth.     Allergies:   Ciprofloxacin, Sulfa antibiotics, Memantine, Succinylsulphathiazole, and Sulfamethoxazole   Past Medical History:  Diagnosis Date  . Anemia   . Arrhythmia   . Cataract   . Colon polyps   . Community acquired pneumonia Feb 2013   LLL  . Congestive heart failure ([place])   . Hyperlipidemia   . Hypertension   . Memory difficulty 10/01/2016  . Osteoporosis   . Pleural effusion, right Feb 2013  . SVT (supraventricular tachycardia) ([place]) feb 2013  .  UTI (lower urinary tract infection)     Past Surgical History:  Procedure Laterality Date  . ABDOMINAL HYSTERECTOMY  1983  . APPENDECTOMY  1983  . COLONOSCOPY    . COLONOSCOPY N/A 06/10/2016   Procedure: COLONOSCOPY;  Surgeon: Manus Gunning, MD;  Location: [facility];  Service: Gastroenterology;  Laterality: N/A;  . ESOPHAGOGASTRODUODENOSCOPY N/A 06/10/2016   Procedure: ESOPHAGOGASTRODUODENOSCOPY (EGD);  Surgeon: Manus Gunning, MD;  Location: [facility];  Service: Gastroenterology;  Laterality: N/A;  . HEMIARTHROPLASTY HIP  2011   Left femoral neck fracture  . UPPER GASTROINTESTINAL ENDOSCOPY       Social History:  The patient  reports that she has never smoked. She has never used smokeless tobacco. She reports that she does not drink alcohol and does not  use drugs.   Family History:  The patient's family history includes Cancer in her grandchild; Colon polyps in her son; Stroke in her father.    ROS:  Please see the history of present illness. All other systems are reviewed and  Negative to the above problem except as noted.    PHYSICAL EXAM: VS:  BP (!) 118/54   Pulse 81   Ht 5' 4.5" (1.638 m)   Wt 133 lb 3.2 oz (60.4 kg)   BMI 22.51 kg/m   GEN: Well nourished, well developed, in no acute distress  HEENT: normal  Neck: JVP is increased   Cardiac: Irreg irreg  ; no murmurs;   1+  edema  Respiratory:  clear to auscultation bilaterally, normal work of breathing GI: soft, nontender, nondistended, + BS  MS: no deformity Moving all extremities   Skin: warm and dry Neuro:   Grossly intact Psych: euthymic mood, full affect   EKG:  EKG is ordered today.  Atrial fibrillation   81 bpm   Nonspeciic ST changes     Lipid Panel    Component Value Date/Time   CHOL 154 09/29/2019 1152   TRIG 61 09/29/2019 1152   HDL 53 09/29/2019 1152   CHOLHDL 2.9 09/29/2019 1152   LDLCALC 89 09/29/2019 1152      Wt Readings from Last 3 Encounters:  05/17/21 133 lb 3.2 oz (60.4 kg)  01/30/21 129 lb 9.6 oz (58.8 kg)  11/08/20 127 lb 12.8 oz (58 kg)      ASSESSMENT AND PLAN:  1  Acute on chronic diastolic CHF    Volume is up today   Wil get labs; adjust meds as it allows     2  Permanent atrial fibrillation   Keep on current regimen    Get CBC and BMET today  3  HTN  BP is OK  Follow       4  HL   Continue statin   Will check lipids today    F/U based on test results      Current medicines are reviewed at length with the patient today.  The patient does not have concerns regarding medicines.  Signed, [name], MD  05/17/2021 1:41 PM    [facility] [address], [place], [place]  [address] Phone: [phone]; Fax: [phone]

## 2021-05-17 NOTE — Patient Instructions (Signed)
Medication Instructions:  No changes today *If you need a refill on your cardiac medications before your next appointment, please call your pharmacy*   Lab Work: Today: cbc, cmet, tsh, bnp, lipids  If you have labs (blood work) drawn today and your tests are completely normal, you will receive your results only by: Marland Kitchen MyChart Message (if you have MyChart) OR . A paper copy in the mail If you have any lab test that is abnormal or we need to change your treatment, we will call you to review the results.   Testing/Procedures: Your physician has requested that you have an echocardiogram.   Echocardiography is a painless test that uses sound waves to create images of your heart. It provides your doctor with information about the size and shape of your heart and how well your heart's chambers and valves are working. This procedure takes approximately one hour. There are no restrictions for this procedure.   Follow-Up:  Other Instructions

## 2021-05-18 LAB — CBC
Hematocrit: 37.4 % (ref 34.0–46.6)
Hemoglobin: 12.2 g/dL (ref 11.1–15.9)
MCH: 31.2 pg (ref 26.6–33.0)
MCHC: 32.6 g/dL (ref 31.5–35.7)
MCV: 96 fL (ref 79–97)
RBC: 3.91 x10E6/uL (ref 3.77–5.28)
RDW: 13.1 % (ref 11.7–15.4)
WBC: 7 10*3/uL (ref 3.4–10.8)

## 2021-05-18 LAB — COMPREHENSIVE METABOLIC PANEL
ALT: 32 IU/L (ref 0–32)
AST: 30 IU/L (ref 0–40)
Albumin/Globulin Ratio: 2 (ref 1.2–2.2)
Albumin: 4.5 g/dL (ref 3.6–4.6)
Alkaline Phosphatase: 137 IU/L — ABNORMAL HIGH (ref 44–121)
BUN/Creatinine Ratio: 18 (ref 12–28)
BUN: 26 mg/dL (ref 8–27)
Bilirubin Total: 0.7 mg/dL (ref 0.0–1.2)
CO2: 22 mmol/L (ref 20–29)
Calcium: 9.5 mg/dL (ref 8.7–10.3)
Chloride: 103 mmol/L (ref 96–106)
Creatinine, Ser: 1.41 mg/dL — ABNORMAL HIGH (ref 0.57–1.00)
Globulin, Total: 2.2 g/dL (ref 1.5–4.5)
Glucose: 89 mg/dL (ref 65–99)
Potassium: 4.4 mmol/L (ref 3.5–5.2)
Sodium: 143 mmol/L (ref 134–144)
Total Protein: 6.7 g/dL (ref 6.0–8.5)
eGFR: 36 mL/min/{1.73_m2} — ABNORMAL LOW (ref >59)

## 2021-05-18 LAB — LIPID PANEL
Chol/HDL Ratio: 3.1 ratio (ref 0.0–4.4)
Cholesterol, Total: 140 mg/dL (ref 100–199)
HDL: 45 mg/dL (ref >39)
LDL Chol Calc (NIH): 77 mg/dL (ref 0–99)
Triglycerides: 95 mg/dL (ref 0–149)
VLDL Cholesterol Cal: 18 mg/dL (ref 5–40)

## 2021-05-18 LAB — PRO B NATRIURETIC PEPTIDE: NT-Pro BNP: 3200 pg/mL — ABNORMAL HIGH (ref 0–738)

## 2021-05-18 LAB — TSH: TSH: 3.33 u[IU]/mL (ref 0.450–4.500)

## 2021-05-24 ENCOUNTER — Telehealth: Payer: Self-pay | Admitting: *Deleted

## 2021-05-24 DIAGNOSIS — I5032 Chronic diastolic (congestive) heart failure: Secondary | ICD-10-CM

## 2021-05-24 MED ORDER — EMPAGLIFLOZIN 10 MG PO TABS: 10.0000 mg | ORAL_TABLET | Freq: Every day | ORAL | 5 refills | Status: DC

## 2021-05-24 NOTE — Telephone Encounter (Signed)
-----  Message from Willeen Cass, RN sent at 05/20/2021  9:33 AM EDT -----  ----- Message ----- From: [name], MD Sent: 05/18/2021  10:02 PM EDT To: [facility] Triage  CBC is normal  Thyroid funciton is normal  Electrolytes are OK   Cr is sl higher at 1.4 Lipds are pretty good FLuid is up   Echo is ordered I would add Jardiance to regimen    Check BMET and BNP on Friday

## 2021-05-24 NOTE — Telephone Encounter (Signed)
Reviewed with Lenna Sciara, PharmD. Pt takes lasix 40 mg daily except every 3rd day she takes 60 mg. PharmD rec for pt to decrease lasix to 40 mg daily with addition of Jardiance. She rec labs in 3-4 weeks after starting.   I spoke w patient's son. Informed of recommendation. He will pick up Jardiance.  They will have pt change lasix to 40 mg daily.   Labs will be done on same day as echo on 6/17.

## 2021-05-29 ENCOUNTER — Telehealth: Payer: Self-pay | Admitting: Internal Medicine

## 2021-05-29 ENCOUNTER — Other Ambulatory Visit: Payer: Self-pay | Admitting: Internal Medicine

## 2021-05-29 NOTE — Telephone Encounter (Signed)
Reviewed with patient. She drinks about 3 sixteen oz bottles of water per day and has increased that a little since starting jardiance.  She is dizzy also and worries that it is the new med causing this.  We reviewed that she has been dizzy on aricept for a while now and this has not gotten better.  She continue to take the lowest dose hoping to be able to start to tolerate it.  Will continue Jardiance and is aware of appts for echo and labs.

## 2021-05-29 NOTE — Telephone Encounter (Signed)
Pt c/o medication issue:  1. Name of Medication:   empagliflozin (JARDIANCE) 10 MG TABS tablet     2. How are you currently taking this medication (dosage and times per day)?Take 1 tablet (10 mg total) by mouth daily before breakfast.  3. Are you having a reaction (difficulty breathing--STAT)? No  4. What is your medication issue?  Since pt has been taking this med she has been dizzy and hasnt slept at night. Pt has been on this medicine since Sunday. Pt would also like to know how much water she should be drinking daily, she has increased her water and she urinating more frequently.

## 2021-06-10 ENCOUNTER — Telehealth: Payer: Self-pay | Admitting: *Deleted

## 2021-06-10 NOTE — Telephone Encounter (Signed)
[name] son [name] (on [name]) called to schedule a sooner appt for a patient. He states she has been adamant about seeing provider sooner. She has been having trouble with her memory and is only tolerating the memantine 5 mg BID due to s/e of dizziness.  I was able to move her august f/u to a sooner appt of 07/02/21 at 12:45 PM with Judson Roch NP. He verbalized appreciation.

## 2021-06-14 ENCOUNTER — Other Ambulatory Visit: Payer: Medicare Other | Admitting: *Deleted

## 2021-06-14 ENCOUNTER — Ambulatory Visit (HOSPITAL_COMMUNITY): Payer: Medicare Other | Attending: Cardiology

## 2021-06-14 ENCOUNTER — Other Ambulatory Visit: Payer: Self-pay

## 2021-06-14 DIAGNOSIS — I5032 Chronic diastolic (congestive) heart failure: Secondary | ICD-10-CM | POA: Insufficient documentation

## 2021-06-14 DIAGNOSIS — R0600 Dyspnea, unspecified: Secondary | ICD-10-CM | POA: Diagnosis not present

## 2021-06-14 DIAGNOSIS — I4821 Permanent atrial fibrillation: Secondary | ICD-10-CM | POA: Insufficient documentation

## 2021-06-14 DIAGNOSIS — R0609 Other forms of dyspnea: Secondary | ICD-10-CM

## 2021-06-14 LAB — ECHOCARDIOGRAM COMPLETE: S' Lateral: 2.6 cm

## 2021-06-17 LAB — BASIC METABOLIC PANEL
BUN/Creatinine Ratio: 19 (ref 12–28)
BUN: 24 mg/dL (ref 8–27)
CO2: 24 mmol/L (ref 20–29)
Calcium: 9.3 mg/dL (ref 8.7–10.3)
Chloride: 101 mmol/L (ref 96–106)
Creatinine, Ser: 1.24 mg/dL — ABNORMAL HIGH (ref 0.57–1.00)
Glucose: 81 mg/dL (ref 65–99)
Potassium: 4.4 mmol/L (ref 3.5–5.2)
Sodium: 144 mmol/L (ref 134–144)
eGFR: 42 mL/min/{1.73_m2} — ABNORMAL LOW (ref >59)

## 2021-06-17 LAB — PRO B NATRIURETIC PEPTIDE: NT-Pro BNP: 2815 pg/mL — ABNORMAL HIGH (ref 0–738)

## 2021-06-19 ENCOUNTER — Telehealth: Payer: Self-pay | Admitting: Internal Medicine

## 2021-06-19 NOTE — Telephone Encounter (Signed)
Spoke w pt and confirmed that she is to take Jardiance one tablet once every day.

## 2021-06-19 NOTE — Telephone Encounter (Signed)
Pt c/o medication issue:  1. Name of Medication: empagliflozin (JARDIANCE) 10 MG TABS tablet  2. How are you currently taking this medication (dosage and times per day)? Patient has been taking one tablet daily  3. Are you having a reaction (difficulty breathing--STAT)? no  4. What is your medication issue? Patient wanted to make sure she is still taking the medication the right way before she gets the medication refilled.   Please assist

## 2021-06-24 ENCOUNTER — Telehealth: Payer: Self-pay | Admitting: Neurology

## 2021-06-24 NOTE — Telephone Encounter (Signed)
[name] son called wanting to know if it would be ok to give the pt the Phosphatidylserine Supplement or can another supplement be advised. Please call son [name] back at the work # when available.

## 2021-06-24 NOTE — Telephone Encounter (Addendum)
Called [name] back. Relayed per Dr. Felecia Shelling that she can take supplement. Dr. Jannifer Franklin out. He verbalized understanding. He asked about other supplements that may help improve memory. Advised there is not medication/supplement that will improve memory. Aricept/Namenda will help slow down process of memory loss.

## 2021-06-25 ENCOUNTER — Telehealth: Payer: Self-pay | Admitting: Internal Medicine

## 2021-06-25 NOTE — Telephone Encounter (Signed)
Patient called and wanted to know if she could get an assistance with the medication empagliflozin (JARDIANCE) 10 MG TABS tablet. Patient states that medication is too expensive

## 2021-06-25 NOTE — Telephone Encounter (Signed)
Pt states she is pay $50 a month for this medication and does not think she can afford it long term. She wondered if there was an assistance program to help her with the cost. I advised her I would forward to our nurse who helps with patient assistance. If there is no assistance available, she may need to switch medications.

## 2021-06-26 NOTE — Telephone Encounter (Signed)
**Note De-Identified  Obfuscation** The pt states that she picked up her [name] RX a couple days ago and was charged $50/30 day supply.  She also states that someone from this office advised her that she should get a 90 day supply of her Jardiance at her next refill and that the cost will only be $50 for a 90 day supply(?).  I advised her that she will need to contact her ins plan to find out what a 90 day supply of Jardiance will cost as there is no way for me to know.  We discussed pt asst through [facility] and she seems interested. I gave her their phone number and encouraged her to call them to see if she would qualify for this asst program and if approved she will get it free for the remainder of this year from [facility].  She states that she will get her son to help her if needed and she is also aware to call Jeani Hawking at Dr Alan Ripper office at [phone] as I am more than happy to help her apply.  I did advise her that once she receives her application in the mail to complete her part, obtain required documents per [facility], and to bring all to Dr Alan Ripper office at [facility] [address] [facility] [address] in [place] to drop off and that we will handle the provider page and will fax all to [facility].  She thanked me for calling her to discuss.

## 2021-07-02 ENCOUNTER — Ambulatory Visit (INDEPENDENT_AMBULATORY_CARE_PROVIDER_SITE_OTHER): Payer: Medicare Other | Admitting: Neurology

## 2021-07-02 ENCOUNTER — Encounter: Payer: Self-pay | Admitting: Neurology

## 2021-07-02 VITALS — BP 144/91 | HR 71 | Ht 64.0 in | Wt 131.0 lb

## 2021-07-02 DIAGNOSIS — G309 Alzheimer's disease, unspecified: Secondary | ICD-10-CM | POA: Diagnosis not present

## 2021-07-02 DIAGNOSIS — F0280 Dementia in other diseases classified elsewhere without behavioral disturbance: Secondary | ICD-10-CM

## 2021-07-02 MED ORDER — DONEPEZIL HCL 10 MG PO TABS: 10.0000 mg | ORAL_TABLET | Freq: Every day | ORAL | 3 refills | Status: AC

## 2021-07-02 MED ORDER — MEMANTINE HCL 5 MG PO TABS: 5.0000 mg | ORAL_TABLET | Freq: Two times a day (BID) | ORAL | 3 refills | Status: AC

## 2021-07-02 NOTE — Patient Instructions (Signed)
Can continue Namenda 5 mg twice daily, keep an eye on the dizziness, could be related to Namenda Continue Aricept  See you back in 8-12 months

## 2021-07-02 NOTE — Progress Notes (Signed)
I have read the note, and I agree with the clinical assessment and plan.  [name]

## 2021-07-02 NOTE — Progress Notes (Signed)
PATIENT: [name] Dyer DOB: [birth year]  REASON FOR VISIT: follow up HISTORY FROM: patient Primary Neurologist: Dr. Jannifer Franklin   Today 07/02/21 [name] Dyer is an 87 year old female with history of memory disturbance. MMSE 23/30 today. Living alone, her sons come throughout the day. She is alone in mornings and night. Does fairly well. She forgets mainly at night, where she put something. She keeps up with medications, does well with this, son looks behind her, uses pillbox. Family brings meals in. Doesn't drive a car. Family helps with housework, she does laundry. Uses rollator. Currently taking Namenda 5 mg twice daily, Aricept 10 mg at bedtime. Sleeps fairly well, denies any dreams. Dizziness manageable with current dose of Namenda, also on Jardiance, which has same effect. Takes trazodone as needed. Xarelto for history AFIB. Claims no major health issues. Has a list of medications she may want to try: Tofranil, Zyprexa. Does walk outside when weather allows. Here today with son, [name].   HISTORY  01/30/2021 Dr. Jannifer Franklin: [name] Dyer is an 87 year old right-handed white female with a history of a slowly progressive memory disturbance that has been present for a number of years.  The patient was first seen through this office and 2017, she was having difficulty at that time with directions with driving.  She scored 28/30 on the Mini-Mental status examination.  The patient underwent MRI of the brain that showed fairly extensive small vessel disease and a chronic right occipital stroke event.  The patient is on chronic anticoagulation therapy.  She has been on Aricept taking 10 mg daily.  She does report some vivid dreams on the medication.  She recent was placed on Namenda, and could not tolerate the drug secondary to dizziness but she claims that she was started on 28 mg extended release medication daily.  She only took the medication for 4 days.  The patient currently lives at home, she is alone  at this point as her husband has passed away.  She does not operate a motor vehicle.  She does not cook, her daughter-in-law makes meals for her and she will hit them up.  She takes pills from a pill dispenser and she writes the checks to pay the bills but requires some help with this.  She does require some help keeping up with appointments.  The patient has not had any falls but she does use a walker outside of the house.  The patient denies issues controlling the bowels or the bladder.  She does have some discomfort in the legs associated with neuropathy, she takes gabapentin for this.  She claims that she does fairly well with the neuropathy pain.  REVIEW OF SYSTEMS: Out of a complete 14 system review of symptoms, the patient complains only of the following symptoms, and all other reviewed systems are negative.  See HPI  ALLERGIES: Allergies  Allergen Reactions   Ciprofloxacin Diarrhea   Sulfa Antibiotics Hives and Rash    Severe Rash   Memantine Other (See Comments)    dizziness   Succinylsulphathiazole Rash   Sulfamethoxazole Rash    HOME MEDICATIONS: Outpatient Medications Prior to Visit  Medication Sig Dispense Refill   acetaminophen (TYLENOL) 500 MG tablet Take 1,000 mg by mouth every 6 (six) hours as needed for mild pain or headache.      alendronate (FOSAMAX) 70 MG tablet Take 70 mg by mouth every Monday.      atenolol (TENORMIN) 25 MG tablet Take 1 tablet twice a day 180 tablet 3  betamethasone dipropionate 0.05 % lotion Apply topically.     cholecalciferol (VITAMIN D3) 25 MCG (1000 UT) tablet Take 2,000 Units by mouth daily.     Cyanocobalamin (VITAMIN B-12) 5000 MCG TBDP Take 5,000 mcg by mouth daily.      donepezil (ARICEPT) 10 MG tablet Take 10 mg by mouth at bedtime.     empagliflozin (JARDIANCE) 10 MG TABS tablet Take 1 tablet (10 mg total) by mouth daily before breakfast. 30 tablet 5   estradiol (ESTRACE) 0.5 MG tablet Take 0.25 mg by mouth every other day.       Fluocinolone Acetonide Scalp (DERMA-SMOOTHE/FS SCALP) 0.01 % OIL Apply to scalp after shower once daily one-seven times weekly 118.28 mL 2   furosemide (LASIX) 40 MG tablet Take 1 tablet (40 mg total) by mouth daily. If weight increase by 2 pounds overnight or leg swelling increase- take 1.5 tablets (60 mg) (Patient taking differently: Take 40 mg by mouth daily. If weight increase by 2 pounds overnight or leg swelling increase- take 1.5 tablets (60 mg)) 30 tablet 9   gabapentin (NEURONTIN) 300 MG capsule Take 300 mg by mouth 3 (three) times daily.     halobetasol (ULTRAVATE) 0.05 % cream APPLY A THIN LAYER TO RED, SCALY AREAS AS DIRECTED UP TO 3 TIMES A WEEK AS NEEDED, AS DIRECTED 50 g 2   ipratropium (ATROVENT) 0.06 % nasal spray CAN USE 2 SPRAYS IN EACH NOSTRIL EVERY 6 HOURS IF NEEDED TO DRY UP RUNNY N0SE 15 mL 0   memantine (NAMENDA) 5 MG tablet Take 15 mg by mouth daily.     Menthol, Topical Analgesic, (BIOFREEZE EX) Apply topically.     potassium chloride (KLOR-CON) 10 MEQ tablet TAKE 1 TABLET BY MOUTH 2 TIMES DAILY 180 tablet 3   Rivaroxaban (XARELTO) 15 MG TABS tablet Take 1 tablet (15 mg total) by mouth daily with supper. 90 tablet 3   simvastatin (ZOCOR) 10 MG tablet Take 10 mg by mouth at bedtime.      traZODone (DESYREL) 50 MG tablet Take by mouth.     No facility-administered medications prior to visit.    PAST MEDICAL HISTORY: Past Medical History:  Diagnosis Date   Anemia    Arrhythmia    Cataract    Colon polyps    Community acquired pneumonia Feb 2013   LLL   Congestive heart failure ([place])    Hyperlipidemia    Hypertension    Memory difficulty 10/01/2016   Osteoporosis    Pleural effusion, right Feb 2013   SVT (supraventricular tachycardia) ([place]) feb 2013   UTI (lower urinary tract infection)     PAST SURGICAL HISTORY: Past Surgical History:  Procedure Laterality Date   [place]   COLONOSCOPY     COLONOSCOPY N/A 06/10/2016    Procedure: COLONOSCOPY;  Surgeon: Manus Gunning, MD;  Location: [facility] END[facility];  Service: Gastroenterology;  Laterality: N/A;   ESOPHAGOGASTRODUODENOSCOPY N/A 06/10/2016   Procedure: ESOPHAGOGASTRODUODENOSCOPY (EGD);  Surgeon: Manus Gunning, MD;  Location: [facility];  Service: Gastroenterology;  Laterality: N/A;   HEMIARTHROPLASTY HIP  2011   Left femoral neck fracture   UPPER GASTROINTESTINAL ENDOSCOPY      FAMILY HISTORY: Family History  Problem Relation Age of Onset   Colon polyps Son    Stroke Father    Cancer Grandchild     SOCIAL HISTORY: Social History   Socioeconomic History   Marital status: Widowed    Spouse name:  Not on file   Number of children: 2   Years of education: 12   Highest education level: Not on file  Occupational History   Occupation: Retired  Tobacco Use   Smoking status: Never   Smokeless tobacco: Never  Vaping Use   Vaping Use: Never used  Substance and Sexual Activity   Alcohol use: No   Drug use: No   Sexual activity: Not on file  Other Topics Concern   Not on file  Social History Narrative   Lives alone, 2 sons take care of her   Right-handed   Caffeine: Cheerwine or hot chocolate occasionally but otherwise none   Social Determinants of Radio broadcast assistant Strain: Not on file  Food Insecurity: Not on file  Transportation Needs: Not on file  Physical Activity: Not on file  Stress: Not on file  Social Connections: Not on file  Intimate Partner Violence: Not on file   PHYSICAL EXAM  Vitals:   07/02/21 1234  BP: (!) 144/91  Pulse: 71  Weight: 131 lb (59.4 kg)  Height: _0  (1.626 m)   Body mass index is 22.49 kg/m.  Generalized: Well developed, in no acute distress  MMSE - Mini Mental State Exam 07/02/2021 01/30/2021 06/02/2018  Orientation to time _1 Orientation to Place _2 Registration _3 Attention/ Calculation 0 1 5  Recall _4 Language- name 2 objects _5 Language- repeat _6 Language- follow 3 step command _7 Language- read & follow direction _8 Write a sentence _9 Write a sentence-comments - I am asleep -  Copy design _10 Total score _11 Neurological examination  Mentation: Alert, history provided by patient and son. Follows all commands speech and language fluent, poor historian Cranial nerve II-XII: Pupils were equal round reactive to light. Extraocular movements were full, visual field were full on confrontational test. Facial sensation and strength were normal. Head turning and shoulder shrug  were normal and symmetric. Motor: The motor testing reveals 5 over 5 strength of all 4 extremities. Good symmetric motor tone is noted throughout.  Sensory: Sensory testing is intact to soft touch on all 4 extremities. No evidence of extinction is noted.  Coordination: Cerebellar testing reveals good finger-nose-finger and heel-to-shin bilaterally.  Gait and station: Gait is wide-based, uses rolling walker, short shuffling type gait Reflexes: Deep tendon reflexes are symmetric but depressed throughout DIAGNOSTIC DATA (LABS, IMAGING, TESTING) - I reviewed patient records, labs, notes, testing and imaging myself where available.  Lab Results  Component Value Date   WBC 7.0 05/17/2021   HGB 12.2 05/17/2021   HCT 37.4 05/17/2021   MCV 96 05/17/2021   PLT CANCELED 05/17/2021      Component Value Date/Time   NA 144 06/14/2021 1529   K 4.4 06/14/2021 1529   CL 101 06/14/2021 1529   CO2 24 06/14/2021 1529   GLUCOSE 81 06/14/2021 1529   GLUCOSE 95 02/02/2020 0338   BUN 24 06/14/2021 1529   CREATININE 1.24 (H) 06/14/2021 1529   CREATININE 0.72 03/16/2015 1717   CALCIUM 9.3 06/14/2021 1529   PROT 6.7 05/17/2021 1411   ALBUMIN 4.5 05/17/2021 1411   AST 30 05/17/2021 1411   ALT 32 05/17/2021 1411   ALKPHOS 137 (H) 05/17/2021 1411   BILITOT 0.7 05/17/2021 1411   GFRNONAA 44 (L)  11/08/2020 1149   GFRAA 50 (L) 11/08/2020 1149   Lab  Results  Component Value Date   CHOL 140 05/17/2021   HDL 45 05/17/2021   LDLCALC 77 05/17/2021   TRIG 95 05/17/2021   CHOLHDL 3.1 05/17/2021   No results found for: HGBA1C Lab Results  Component Value Date   VITAMINB12 495 10/01/2016   Lab Results  Component Value Date   TSH 3.330 05/17/2021      ASSESSMENT AND PLAN 87 y.o. year old female  has a past medical history of Anemia, Arrhythmia, Cataract, Colon polyps, Community acquired pneumonia (Feb 2013), Congestive heart failure ([place]), Hyperlipidemia, Hypertension, Memory difficulty (10/01/2016), Osteoporosis, Pleural effusion, right (Feb 2013), SVT (supraventricular tachycardia) ([place]) (feb 2013), and UTI (lower urinary tract infection). here with:    1.  Chronic memory disturbance   2.  Cerebrovascular disease by MRI brain  -MMSE 23/30, remains overall stable -Continue Aricept 10 mg at bedtime -Continue Namenda 5 mg twice a day, higher dosing resulted in dizziness, she does still have mild dizziness, but wishes to continue the medication -She remains active doing word puzzles, lives alone with family support -Follow-up in 8 to 12 months or sooner if needed  I spent 35 minutes of face-to-face and non-face-to-face time with patient.  This included previsit chart review, lab review, study review, order entry, discussing memory, medications, management, and follow-up with she and her son.   Butler Denmark, AGNP-C, DNP 07/02/2021, 12:43 PM [facility] [address] [place], [place] [address] [phone]

## 2021-07-10 ENCOUNTER — Telehealth: Payer: Self-pay | Admitting: Internal Medicine

## 2021-07-10 ENCOUNTER — Ambulatory Visit: Payer: Medicare Other | Admitting: Podiatry

## 2021-07-10 NOTE — Telephone Encounter (Signed)
[name] son is f/u on Ms. [place] assistance paperwork. [name] son [name] would like the paperwork mailed to the address below. [place], [address]

## 2021-07-10 NOTE — Telephone Encounter (Signed)
**Note De-Identified  Obfuscation** No answer so I left a message on the pts son (DPR) [name]'s VM asking him to call Jeani Hawking at [facility] at Dr Alan Ripper office at [phone].

## 2021-07-10 NOTE — Telephone Encounter (Signed)
[name] son returning call.

## 2021-07-11 NOTE — Telephone Encounter (Signed)
**Note De-Identified  Obfuscation** See phone note in the pts chart from 6/28 for update.

## 2021-07-11 NOTE — Telephone Encounter (Signed)
**Note De-Identified  Obfuscation** I have also advised [name], the pts son and DPR to contact [place] for asst with the pts Jardiance. I provided him their phone number and he stated that he understands from the previous conversation I had with the pt what to do to apply with [employer] for the pts Jardiance. He states that the pt currently has a 30 day supply of Jardiance on hand.  He thanked me for calling him back.

## 2021-07-15 NOTE — Telephone Encounter (Signed)
Forwarding to RN in [place] office where Dr. Harrington Challenger is working today.  I have forwarded the email with the paper work to her as well.

## 2021-07-15 NOTE — Telephone Encounter (Signed)
**Note De-Identified  Obfuscation** The pts completed [employer] Pt Asst application for Jardiance was left at the office. I have completed the provider page of the application and emailed all to Dr Alan Ripper nurse she can obtain her signature, date it, and to fax all to [place] at the fax number written on the cover letter included.

## 2021-07-15 NOTE — Telephone Encounter (Signed)
Dr.Ross has signed form, faxed with approval, form send back with Dr.Ross

## 2021-07-17 ENCOUNTER — Other Ambulatory Visit: Payer: Self-pay

## 2021-07-17 ENCOUNTER — Encounter: Payer: Self-pay | Admitting: Podiatry

## 2021-07-17 ENCOUNTER — Ambulatory Visit (INDEPENDENT_AMBULATORY_CARE_PROVIDER_SITE_OTHER): Payer: Medicare Other | Admitting: Podiatry

## 2021-07-17 DIAGNOSIS — G629 Polyneuropathy, unspecified: Secondary | ICD-10-CM

## 2021-07-17 DIAGNOSIS — B351 Tinea unguium: Secondary | ICD-10-CM

## 2021-07-17 DIAGNOSIS — M79676 Pain in unspecified toe(s): Secondary | ICD-10-CM

## 2021-07-17 NOTE — Progress Notes (Signed)
This patient returns to my office for at risk foot care.  This patient requires this care by a professional since this patient will be at risk due to having neuropathy.  This patient is unable to cut nails herself since the patient cannot reach her nails.These nails are painful walking and wearing shoes.  This patient presents for at risk foot care today.  General Appearance  Alert, conversant and in no acute stress.  Vascular  Dorsalis pedis and posterior tibial  pulses are palpable  bilaterally.  Capillary return is within normal limits  bilaterally. Temperature is within normal limits  bilaterally.  Neurologic  Senn-Weinstein monofilament wire test absent bilaterally. Muscle power within normal limits bilaterally.  Nails Thick disfigured discolored nails with subungual debris  from hallux to fifth toes bilaterally. No evidence of bacterial infection or drainage bilaterally.  Orthopedic  No limitations of motion  feet .  No crepitus or effusions noted.  No bony pathology or digital deformities noted.  Skin  normotropic skin with no porokeratosis noted bilaterally.  No signs of infections or ulcers noted.     Onychomycosis  Pain in right toes  Pain in left toes  Consent was obtained for treatment procedures.   Mechanical debridement of nails 1-5  bilaterally performed with a nail nipper.  Filed with dremel without incident.    Return office visit      3 months                Told patient to return for periodic foot care and evaluation due to potential at risk complications.   [name] DPM

## 2021-07-19 ENCOUNTER — Ambulatory Visit: Payer: Medicare Other | Admitting: Internal Medicine

## 2021-08-05 ENCOUNTER — Ambulatory Visit: Payer: Medicare Other | Admitting: Neurology

## 2021-08-16 ENCOUNTER — Other Ambulatory Visit: Payer: Self-pay | Admitting: Internal Medicine

## 2021-08-23 ENCOUNTER — Ambulatory Visit: Payer: Medicare Other | Admitting: Podiatry

## 2021-08-29 ENCOUNTER — Other Ambulatory Visit: Payer: Self-pay | Admitting: *Deleted

## 2021-08-29 ENCOUNTER — Telehealth: Payer: Self-pay | Admitting: Internal Medicine

## 2021-08-29 MED ORDER — ATENOLOL 25 MG PO TABS: ORAL_TABLET | ORAL | 3 refills | Status: DC

## 2021-08-29 NOTE — Telephone Encounter (Signed)
Pt c/o swelling: STAT is pt has developed SOB within 24 hours  If swelling, where is the swelling located? Patient does not see any  How much weight have you gained and in what time span? 3.6 pounds overnight  Have you gained 3 pounds in a day or 5 pounds in a week? 3 lbs overnight   Do you have a log of your daily weights (if so, list)?   08/28/21: 123.8 08/29/21: 127.4  Are you currently taking a fluid pill? yes  Are you currently SOB? no  Have you traveled recently? no

## 2021-08-29 NOTE — Telephone Encounter (Signed)
Pt reports she thought the weight was wrong when she stepped on this morning.  We decided she will weigh again tomorrow morning and if weight is up 2 pounds or more above yesterday's weight.  We decided if she is 126 lbs or greater, she will take 60 mg total of lasix tomorrow morning.   If <126, will just take her usual 40 mg dose. Pt wrote this down.  She thanks me for calling.

## 2021-08-30 NOTE — Telephone Encounter (Signed)
Returned call to Pt.  Reiterated lasix instructions from 08/29/2021.  Pt thanked nurse for call back.

## 2021-08-30 NOTE — Telephone Encounter (Signed)
   Pt calling back, she said her weight today is 226 lbs she would like to know if she still needs to take additional dose of her fluid pill

## 2021-09-03 ENCOUNTER — Telehealth: Payer: Self-pay | Admitting: Internal Medicine

## 2021-09-03 NOTE — Telephone Encounter (Signed)
Follow Up:   Patient says her Lasix was increased on 08-29-21. Her weight for 4 days was 126.4. Today her weight was 125. She wants to know if she she still need to take the increased dose.?  If so, for how long?

## 2021-09-03 NOTE — Telephone Encounter (Signed)
Patient wanted to confirm how she should take lasix. She takes 40 mg daily but takes extra 20 mg (she has 20 mg tablets also) if weight goes to 126 or above.  Reinforced that up to 125.9 she can just take 40 mg.  Anything higher - take the 40 mg and 20 mg tablets.  Pt voices understanding and repeats plan back to me.  Adv to call if further questions.  She has problems w her memory and her sons help with medications.

## 2021-10-07 ENCOUNTER — Telehealth: Payer: Self-pay | Admitting: Internal Medicine

## 2021-10-07 DIAGNOSIS — Z79899 Other long term (current) drug therapy: Secondary | ICD-10-CM

## 2021-10-07 DIAGNOSIS — I5032 Chronic diastolic (congestive) heart failure: Secondary | ICD-10-CM

## 2021-10-07 NOTE — Telephone Encounter (Signed)
Pt c/o medication issue:  1. Name of Medication: furosemide (LASIX) 40 MG tablet  2. How are you currently taking this medication (dosage and times per day)? PT WAS ADVISED TO INCREASE THIS MEDICINE IF SHE HAS MORE THAN NORMAL SWELLING  3. Are you having a reaction (difficulty breathing--STAT)? NO  4. What is your medication issue? PT IS HAVING PAINFUL FEET BUT SHE'S NOT SURE IF THEY ARE SWOLLEN. PT WANTS TO KNOW IF SHE SHOULD INCREASE OR DECREASE THIS MEDICINE. PT SAYS THAT HER FEET HURT ALL THE TIME AND THEY ARE TIGHT BUT SHE'S NOT CERTAIN IF THEY ARE SWOLLEN AT THIS TIME.

## 2021-10-07 NOTE — Telephone Encounter (Signed)
Called patient about her message. Patient stated every 5 days she is needing to take extra Lasix due to weight gain or edema. Patient was wondering how long she needs to keep doing this or does she need to change her daily dose. Will forward to Dr. Harrington Challenger and her nurse for advisement.

## 2021-10-08 MED ORDER — FUROSEMIDE 40 MG PO TABS: 40.0000 mg | ORAL_TABLET | Freq: Every day | ORAL | 3 refills | Status: DC

## 2021-10-08 NOTE — Telephone Encounter (Signed)
Reviewed phone note   I would not go up on daily dose She could take 1.5  2x per week to keep up with swelling  or stay the same. Would recomm BMET and BNP in 2 weeks

## 2021-10-08 NOTE — Telephone Encounter (Signed)
Pt verbalized understanding of Dr. Harrington Challenger' recommendations ans will return for labs 10/22/21.   Pt will watch her NA intake and will elevate her feet when sitting. She will also talk to her PCP about her Neuropathy management.

## 2021-10-09 ENCOUNTER — Telehealth: Payer: Self-pay | Admitting: Internal Medicine

## 2021-10-09 NOTE — Telephone Encounter (Signed)
Pt and her son, [name] Dyer, called to report that they received forms from the pt assistance program  for her [name] Dyer saying that they need the forms update since they expire and of 11/2021... they go by the calendar year.   I have asked that they go ahead and fill out and bring to our office to complete and send in for them.   He is also asking about how to get her Xarelto refilled, that we have her in a "program" that gets it cheaper for her and is still filled at [facility]. I advised him that I think he can just get is refilled but I will check with our pot assistance nurse Jeani Hawking that will be back in the office tomorrow.     For the Pharm D:  They are also asking if she can start Zyrtec for allergies and "fish oil" omega 3 supplements... but they were warned about med interactions... I will forward to our Pharm D for review to be sure.

## 2021-10-09 NOTE — Telephone Encounter (Signed)
Pt is reaching out in regards to patient assistance forms for his Jardiance and Xarelto.. please advise

## 2021-10-09 NOTE — Telephone Encounter (Signed)
Pt would also like to know about zyrtec and if he she can take fish oil

## 2021-10-09 NOTE — Telephone Encounter (Signed)
Message left per the pts son's request re: the Pharm D recommendations... I had not noted in my previous note that she wanted to take the fish oil for memory issues.

## 2021-10-09 NOTE — Telephone Encounter (Signed)
Ok to take Zyrtec. Fish oil is typically recommended for patients who have elevated triglycerides and hers are normal. Not sure if she is taking lower dose for other indication. Main interaction is that fish oil has some antiplatelet effects and she already takes Xarelto. She should monitor for any additional bruising/bleeding out of the ordinary and d/c the fish oil if noted.

## 2021-10-10 NOTE — Telephone Encounter (Signed)
**Note De-Identified  Obfuscation** I called the pt and she requested that I call her son [name] Dyer.  I called [name] Dyer and we discussed pt asst for Xarelto through the [place] pt [employer].  He is interested in the Xarelto program through [employer] so I gave him their phone number and web site address so he can either call them with questions about the program and the pts eligibility to be approved for the program and to request that they mail them an application or go online to print an application which list eligibility Requirements.  He states that they do have the pts [employer] Pt Asst application for Jardiance already and that he will complete both applications ([employer] for Jardiance and [employer] for Xarelto once received), obtain required documents, and will bring all to Dr Alan Ripper office at [facility] at [employer]	[place] in [place] to drop off. He is aware that we will complete the provider page of each application and will fax to appropriate foundation.

## 2021-10-15 ENCOUNTER — Telehealth: Payer: Self-pay | Admitting: Internal Medicine

## 2021-10-15 NOTE — Telephone Encounter (Signed)
Spoke with pt and she states she doesn't feel like her swelling has improved since last week when she was told to take an extra half tab of Furosemide twice a week.  She took 41m on 10/14 and 10/17, 49mon all other days.  When she looks at her feet and ankles she said they don't look puffy.  States when she's up walking it feels like they are rounded.  Asked if she has spoken with her PCP about neuropathy as previously discussed and she states she has not.  Denies increased salt intake other than a meal at church a little over a week ago.  Does wear compression stockings everyday.  Is not elevating feet when she's sitting.  Gets SOB with exertion.  Scheduled pt to come in and see Dr. RoHarrington Challengern Thursday.  Pt will call the person that brings her and call back if this appt is going to be an issue.

## 2021-10-15 NOTE — Telephone Encounter (Signed)
Pt c/o swelling: STAT is pt has developed SOB within 24 hours  How much weight have you gained and in what time span?  No weight gain  If swelling, where is the swelling located?  Feet and legs, patient states her may concern is the pain and tightness in her feet  Are you currently taking a fluid pill?  Yes, patient is on Lasix. She assumes it may need to be increased  Are you currently SOB?  No   Do you have a log of your daily weights (if so, list)?  No log available   Have you gained 3 pounds in a day or 5 pounds in a week?  No weight gain   Have you traveled recently?  No

## 2021-10-16 ENCOUNTER — Telehealth: Payer: Self-pay | Admitting: Internal Medicine

## 2021-10-16 NOTE — Telephone Encounter (Signed)
Patient says that she has an upcoming appt on Friday and she says that she take a double LASIX every Friday and wants to know if its ok to take

## 2021-10-16 NOTE — Progress Notes (Signed)
Cardiology Office Note   Date:  10/17/2021   ID:  [name] Dyer, DOB Sep 07, 1932, MRN [id number]  PCP:  [name], MD  Cardiologist:   [name], MD   Pt presents for f/u of permanent afib and diastolic CHF      History of Present Illness: [name] Dyer is a 87 y.o. female with a history of atrial fibrillation, chronic diastolic CHF, mitral regurgitation, HTN and HL   He was admitted in Feb 2021 with CHF   Echo showed LVEF 55 to 60%; mild to mod MR I last saw the pt in May 2022   AT the time she was gaining wt, eating less  On exam, volume was up  I recomm adding Jardiance Echo done showing LVEF and RVEF were normal   Later lasix increased to 60 mg every 3rd day  Since then the pt continues to have LE edema   SHe says her feet feel round.   Does have some SOB   Has to breathe deeply some times   Denies CP   NO dizziness    Current Meds  Medication Sig   acetaminophen (TYLENOL) 500 MG tablet Take 1,000 mg by mouth every 6 (six) hours as needed for mild pain or headache.    alendronate (FOSAMAX) 70 MG tablet Take 70 mg by mouth every Monday.    atenolol (TENORMIN) 25 MG tablet Take 1 tablet twice a day   betamethasone dipropionate 0.05 % lotion Apply topically.   cholecalciferol (VITAMIN D3) 25 MCG (1000 UT) tablet Take 2,000 Units by mouth daily.   Cyanocobalamin (VITAMIN B-12) 5000 MCG TBDP Take 5,000 mcg by mouth daily.    donepezil (ARICEPT) 10 MG tablet Take 1 tablet (10 mg total) by mouth at bedtime.   empagliflozin (JARDIANCE) 10 MG TABS tablet Take 1 tablet (10 mg total) by mouth daily before breakfast.   furosemide (LASIX) 40 MG tablet Take 1 tablet (40 mg total) by mouth daily. Pt to take an extra 20 mg twice a week only for edema.   gabapentin (NEURONTIN) 300 MG capsule Take 300 mg by mouth 3 (three) times daily.   ipratropium (ATROVENT) 0.06 % nasal spray CAN USE 2 SPRAYS IN EACH NOSTRIL EVERY 6 HOURS IF NEEDED TO DRY UP RUNNY N0SE   memantine (NAMENDA) 5 MG  tablet Take 1 tablet (5 mg total) by mouth 2 (two) times daily.   Menthol, Topical Analgesic, (BIOFREEZE EX) Apply topically.   potassium chloride (KLOR-CON) 10 MEQ tablet TAKE 1 TABLET BY MOUTH 2 TIMES DAILY   Rivaroxaban (XARELTO) 15 MG TABS tablet Take 1 tablet (15 mg total) by mouth daily with supper.   simvastatin (ZOCOR) 10 MG tablet Take 10 mg by mouth at bedtime.    traZODone (DESYREL) 50 MG tablet Take by mouth.     Allergies:   Ciprofloxacin, Sulfa antibiotics, Memantine, Succinylsulphathiazole, and Sulfamethoxazole   Past Medical History:  Diagnosis Date   Anemia    Arrhythmia    Cataract    Colon polyps    Community acquired pneumonia Feb 2013   LLL   Congestive heart failure ([place])    Hyperlipidemia    Hypertension    Memory difficulty 10/01/2016   Osteoporosis    Pleural effusion, right Feb 2013   SVT (supraventricular tachycardia) ([place]) feb 2013   UTI (lower urinary tract infection)     Past Surgical History:  Procedure Laterality Date   [place]  COLONOSCOPY     COLONOSCOPY N/A 06/10/2016   Procedure: COLONOSCOPY;  Surgeon: Manus Gunning, MD;  Location: [facility];  Service: Gastroenterology;  Laterality: N/A;   ESOPHAGOGASTRODUODENOSCOPY N/A 06/10/2016   Procedure: ESOPHAGOGASTRODUODENOSCOPY (EGD);  Surgeon: Manus Gunning, MD;  Location: [facility];  Service: Gastroenterology;  Laterality: N/A;   HEMIARTHROPLASTY HIP  2011   Left femoral neck fracture   UPPER GASTROINTESTINAL ENDOSCOPY       Social History:  The patient  reports that she has never smoked. She has never used smokeless tobacco. She reports that she does not drink alcohol and does not use drugs.   Family History:  The patient's family history includes Cancer in her grandchild; Colon polyps in her son; Stroke in her father.    ROS:  Please see the history of present illness. All other systems are reviewed and  Negative to the  above problem except as noted.    PHYSICAL EXAM: VS:  BP (!) 152/80   Pulse (!) 56   Ht _0  (1.626 m)   Wt 135 lb (61.2 kg)   SpO2 99%   BMI 23.17 kg/m   GEN: Well nourished, well developed, in no acute distress  HEENT: normal  Neck: JVP is normal  Cardiac: Irreg irreg  ; no murmurs;   1+  edema  Respiratory:  clear to auscultation bilaterally, normal work of breathing GI: soft, nontender, nondistended, + BS  MS: no deformity Moving all extremities   Skin: warm and dry Neuro:   Grossly intact Psych: euthymic mood, full affect   EKG:  EKG was not  ordered today.   Lipid Panel    Component Value Date/Time   CHOL 140 05/17/2021 1411   TRIG 95 05/17/2021 1411   HDL 45 05/17/2021 1411   CHOLHDL 3.1 05/17/2021 1411   LDLCALC 77 05/17/2021 1411      Wt Readings from Last 3 Encounters:  10/17/21 135 lb (61.2 kg)  07/02/21 131 lb (59.4 kg)  05/17/21 133 lb 3.2 oz (60.4 kg)      ASSESSMENT AND PLAN:  1  Acute on chronic diastolic CHF    VOlume remains elevated    Reviewed salt and fluid restrictions.    Will get labs today   Continue current Rx   Consider switch to torsemide    2  Permanent atrial fibrillation   Keep on current regimen  Continue Xarelto  3  HTN  BP is elevated today   Follow for now  4  HL   Continue statin   LDL 77 and HDL 45 in May    Medicine changes based on test results      Current medicines are reviewed at length with the patient today.  The patient does not have concerns regarding medicines.  Signed, [name], MD  10/17/2021 8:34 PM    [address], [place], [place]  [address] Phone: [phone]; Fax: [phone]

## 2021-10-16 NOTE — Telephone Encounter (Signed)
Pt will go ahead and take her normal dose which is an extra 20 mg of Lasix very early in the morning as she always does.. she is not worried about having to urinate more often so she will take her normal doses.

## 2021-10-17 ENCOUNTER — Ambulatory Visit (INDEPENDENT_AMBULATORY_CARE_PROVIDER_SITE_OTHER): Payer: Medicare Other | Admitting: Internal Medicine

## 2021-10-17 ENCOUNTER — Encounter: Payer: Self-pay | Admitting: Internal Medicine

## 2021-10-17 ENCOUNTER — Other Ambulatory Visit: Payer: Self-pay

## 2021-10-17 VITALS — BP 152/80 | HR 56 | Ht 64.0 in | Wt 135.0 lb

## 2021-10-17 DIAGNOSIS — I5032 Chronic diastolic (congestive) heart failure: Secondary | ICD-10-CM

## 2021-10-17 DIAGNOSIS — Z79899 Other long term (current) drug therapy: Secondary | ICD-10-CM | POA: Diagnosis not present

## 2021-10-17 DIAGNOSIS — R0609 Other forms of dyspnea: Secondary | ICD-10-CM

## 2021-10-17 NOTE — Patient Instructions (Signed)
Medication Instructions:  Your physician recommends that you continue on your current medications as directed. Please refer to the Current Medication list given to you today.  *If you need a refill on your cardiac medications before your next appointment, please call your pharmacy*   Lab Work: Bmet, BNP, CBC If you have labs (blood work) drawn today and your tests are completely normal, you will receive your results only by: [place] (if you have MyChart) OR A paper copy in the mail If you have any lab test that is abnormal or we need to change your treatment, we will call you to review the results.   Testing/Procedures: none   Follow-Up: At [facility], you and your health needs are our priority.  As part of our continuing mission to provide you with exceptional heart care, we have created designated Provider Care Teams.  These Care Teams include your primary Cardiologist (physician) and Advanced Practice Providers (APPs -  Physician Assistants and Nurse Practitioners) who all work together to provide you with the care you need, when you need it.  We recommend signing up for the patient portal called "MyChart".  Sign up information is provided on this After Visit Summary.  MyChart is used to connect with patients for Virtual Visits (Telemedicine).  Patients are able to view lab/test results, encounter notes, upcoming appointments, etc.  Non-urgent messages can be sent to your provider as well.   To learn more about what you can do with MyChart, go to [email or web address].    Your next appointment:   4 month(s)  The format for your next appointment:   In Person  Provider:   [name], MD   Other Instructions

## 2021-10-18 LAB — CBC
Hematocrit: 39.4 % (ref 34.0–46.6)
Hemoglobin: 12.6 g/dL (ref 11.1–15.9)
MCH: 29.4 pg (ref 26.6–33.0)
MCHC: 32 g/dL (ref 31.5–35.7)
MCV: 92 fL (ref 79–97)
Platelets: 100 10*3/uL — CL (ref 150–450)
RBC: 4.29 x10E6/uL (ref 3.77–5.28)
RDW: 14.5 % (ref 11.7–15.4)
WBC: 6.6 10*3/uL (ref 3.4–10.8)

## 2021-10-18 LAB — BASIC METABOLIC PANEL
BUN/Creatinine Ratio: 24 (ref 12–28)
BUN: 31 mg/dL — ABNORMAL HIGH (ref 8–27)
CO2: 25 mmol/L (ref 20–29)
Calcium: 8.9 mg/dL (ref 8.7–10.3)
Chloride: 102 mmol/L (ref 96–106)
Creatinine, Ser: 1.27 mg/dL — ABNORMAL HIGH (ref 0.57–1.00)
Glucose: 88 mg/dL (ref 70–99)
Potassium: 4.3 mmol/L (ref 3.5–5.2)
Sodium: 142 mmol/L (ref 134–144)
eGFR: 41 mL/min/{1.73_m2} — ABNORMAL LOW (ref >59)

## 2021-10-18 LAB — PRO B NATRIURETIC PEPTIDE: NT-Pro BNP: 2356 pg/mL — ABNORMAL HIGH (ref 0–738)

## 2021-10-21 ENCOUNTER — Encounter: Payer: Self-pay | Admitting: Podiatry

## 2021-10-21 ENCOUNTER — Other Ambulatory Visit: Payer: Self-pay

## 2021-10-21 ENCOUNTER — Telehealth: Payer: Self-pay | Admitting: Internal Medicine

## 2021-10-21 ENCOUNTER — Ambulatory Visit (INDEPENDENT_AMBULATORY_CARE_PROVIDER_SITE_OTHER): Payer: Medicare Other | Admitting: Podiatry

## 2021-10-21 DIAGNOSIS — B351 Tinea unguium: Secondary | ICD-10-CM

## 2021-10-21 DIAGNOSIS — M79676 Pain in unspecified toe(s): Secondary | ICD-10-CM

## 2021-10-21 DIAGNOSIS — G629 Polyneuropathy, unspecified: Secondary | ICD-10-CM

## 2021-10-21 DIAGNOSIS — I5032 Chronic diastolic (congestive) heart failure: Secondary | ICD-10-CM

## 2021-10-21 DIAGNOSIS — Z79899 Other long term (current) drug therapy: Secondary | ICD-10-CM

## 2021-10-21 DIAGNOSIS — R0609 Other forms of dyspnea: Secondary | ICD-10-CM

## 2021-10-21 MED ORDER — TORSEMIDE 20 MG PO TABS: ORAL_TABLET | ORAL | 3 refills | Status: DC

## 2021-10-21 NOTE — Progress Notes (Signed)
This patient returns to my office for at risk foot care.  This patient requires this care by a professional since this patient will be at risk due to having neuropathy.  This patient is unable to cut nails herself since the patient cannot reach her nails.These nails are painful walking and wearing shoes.  This patient presents for at risk foot care today.  General Appearance  Alert, conversant and in no acute stress.  Vascular  Dorsalis pedis and posterior tibial  pulses are palpable  bilaterally.  Capillary return is within normal limits  bilaterally. Temperature is within normal limits  bilaterally.  Neurologic  Senn-Weinstein monofilament wire test absent bilaterally. Muscle power within normal limits bilaterally.  Nails Thick disfigured discolored nails with subungual debris  from hallux to fifth toes bilaterally. No evidence of bacterial infection or drainage bilaterally.  Orthopedic  No limitations of motion  feet .  No crepitus or effusions noted.  No bony pathology or digital deformities noted.  Skin  normotropic skin with no porokeratosis noted bilaterally.  No signs of infections or ulcers noted.     Onychomycosis  Pain in right toes  Pain in left toes  Consent was obtained for treatment procedures.   Mechanical debridement of nails 1-5  bilaterally performed with a nail nipper.  Filed with dremel without incident.    Return office visit      3 months                Told patient to return for periodic foot care and evaluation due to potential at risk complications.   Cheryl Stabenow DPM   

## 2021-10-21 NOTE — Telephone Encounter (Signed)
New Message:     Pt needs instructions on Furosemide. She missed a dose yesterday. Also need to know how  much water is she supposed to drink a day?Marland Kitchen

## 2021-10-21 NOTE — Telephone Encounter (Signed)
Pt and her son verbalized understanding of her lab results and will let us know by the end of the week how she is doing... she will return in one week for repeat labs.

## 2021-10-22 ENCOUNTER — Other Ambulatory Visit: Payer: Medicare Other

## 2021-10-29 ENCOUNTER — Other Ambulatory Visit: Payer: Self-pay

## 2021-10-29 ENCOUNTER — Other Ambulatory Visit: Payer: Medicare Other | Admitting: *Deleted

## 2021-10-29 DIAGNOSIS — I5032 Chronic diastolic (congestive) heart failure: Secondary | ICD-10-CM

## 2021-10-29 DIAGNOSIS — Z79899 Other long term (current) drug therapy: Secondary | ICD-10-CM

## 2021-10-29 DIAGNOSIS — R0609 Other forms of dyspnea: Secondary | ICD-10-CM

## 2021-10-30 LAB — BASIC METABOLIC PANEL
BUN/Creatinine Ratio: 22 (ref 12–28)
BUN: 34 mg/dL — ABNORMAL HIGH (ref 8–27)
CO2: 26 mmol/L (ref 20–29)
Calcium: 9.1 mg/dL (ref 8.7–10.3)
Chloride: 101 mmol/L (ref 96–106)
Creatinine, Ser: 1.58 mg/dL — ABNORMAL HIGH (ref 0.57–1.00)
Glucose: 118 mg/dL — ABNORMAL HIGH (ref 70–99)
Potassium: 4.4 mmol/L (ref 3.5–5.2)
Sodium: 144 mmol/L (ref 134–144)
eGFR: 31 mL/min/{1.73_m2} — ABNORMAL LOW (ref >59)

## 2021-10-30 LAB — PRO B NATRIURETIC PEPTIDE: NT-Pro BNP: 13 pg/mL (ref 0–738)

## 2021-11-05 ENCOUNTER — Telehealth: Payer: Self-pay

## 2021-11-05 DIAGNOSIS — I5032 Chronic diastolic (congestive) heart failure: Secondary | ICD-10-CM

## 2021-11-05 DIAGNOSIS — Z79899 Other long term (current) drug therapy: Secondary | ICD-10-CM

## 2021-11-05 DIAGNOSIS — R0609 Other forms of dyspnea: Secondary | ICD-10-CM

## 2021-11-05 NOTE — Telephone Encounter (Signed)
Keep keeping track of weights I would recomm another BMET and BNP in 3 to 4 weeks

## 2021-11-05 NOTE — Telephone Encounter (Signed)
Spoke with the pt and she reports that she is feeling well... she does not notice any peripheral edema... she says if there is any it is mild... she says she is mildly SOB with exertion.. she is concerned about her weight... her son advised me a while back that she gets very "concerned" about her weight and he says that she gets somewhat "obsessive" over it.    She reports her last several weights:  128 127 127 125 124 123 Today.. 125.  She fell last week when she was leaning over to wash her hair and she lost her balance.. She has a wound on her shin that she has been keeping clean and bandaging... I advised her to monitor and if any problems with healing to talk with her PCP.. Dr. Daphene Jaeger.   Will forward to Dr. Harrington Challenger.   Next OV 01/29/22

## 2021-11-06 NOTE — Telephone Encounter (Signed)
Pt advised and she will have her labs drawn the week of 12/02/21 her son will call back with a date.

## 2021-11-06 NOTE — Telephone Encounter (Signed)
Cut back on torsemide to 20 mg daily (no 30 mg anymore alternating) Get BMET and BNP the end of next week

## 2021-11-06 NOTE — Telephone Encounter (Signed)
[name] son is following up. He is requesting a call back to review patient's lab results as well.

## 2021-11-06 NOTE — Telephone Encounter (Signed)
Spoke with the pts son and he is asking if the pt should continue the same dose of her Lasix at this point with her improved fluid  level... he will plan to bring her back 12/03/21 for repeat labs.   Will forward to Dr. Harrington Challenger to see if keeping the same med dosing.

## 2021-11-07 MED ORDER — TORSEMIDE 20 MG PO TABS: 20.0000 mg | ORAL_TABLET | Freq: Every day | ORAL | 3 refills | Status: DC

## 2021-11-07 MED ORDER — TORSEMIDE 20 MG PO TABS: 20.0000 mg | ORAL_TABLET | Freq: Two times a day (BID) | ORAL | 3 refills | Status: DC

## 2021-11-07 NOTE — Addendum Note (Signed)
Addended by: Stephani Police on: 11/07/2021 10:12 AM   Modules accepted: Orders

## 2021-11-07 NOTE — Telephone Encounter (Signed)
Spoke with [name] and he will bring the pt back in for labs 11/15/21.

## 2021-11-07 NOTE — Telephone Encounter (Signed)
Lm for the pts son/ [name], to please call me back for the new recommendations from Dr. Harrington Challenger.

## 2021-11-15 ENCOUNTER — Other Ambulatory Visit: Payer: Medicare Other | Admitting: *Deleted

## 2021-11-15 ENCOUNTER — Other Ambulatory Visit: Payer: Self-pay

## 2021-11-15 DIAGNOSIS — I5032 Chronic diastolic (congestive) heart failure: Secondary | ICD-10-CM

## 2021-11-15 DIAGNOSIS — R0609 Other forms of dyspnea: Secondary | ICD-10-CM

## 2021-11-15 DIAGNOSIS — Z79899 Other long term (current) drug therapy: Secondary | ICD-10-CM

## 2021-11-16 LAB — BASIC METABOLIC PANEL
BUN/Creatinine Ratio: 21 (ref 12–28)
BUN: 33 mg/dL — ABNORMAL HIGH (ref 8–27)
CO2: 25 mmol/L (ref 20–29)
Calcium: 9.4 mg/dL (ref 8.7–10.3)
Chloride: 100 mmol/L (ref 96–106)
Creatinine, Ser: 1.58 mg/dL — ABNORMAL HIGH (ref 0.57–1.00)
Glucose: 87 mg/dL (ref 70–99)
Potassium: 4.6 mmol/L (ref 3.5–5.2)
Sodium: 142 mmol/L (ref 134–144)
eGFR: 31 mL/min/{1.73_m2} — ABNORMAL LOW (ref >59)

## 2021-11-16 LAB — PRO B NATRIURETIC PEPTIDE: NT-Pro BNP: 3753 pg/mL — ABNORMAL HIGH (ref 0–738)

## 2021-11-18 ENCOUNTER — Telehealth: Payer: Self-pay

## 2021-11-18 ENCOUNTER — Telehealth: Payer: Self-pay | Admitting: Internal Medicine

## 2021-11-18 NOTE — Telephone Encounter (Signed)
Son called back. Discussed result findings and MD recommendation. Son is agreeable to plan. Aware pt is scheduled to see Dr. Harrington Challenger next week.

## 2021-11-18 NOTE — Telephone Encounter (Signed)
LM for the pts son to call back re: her lab results... can work her into Dr. Harrington Challenger' schedule for Tuesday 11/26/21 on a 7 day hold per [place] Pizzimenti B.   Will wait for her son to call back.

## 2021-11-18 NOTE — Telephone Encounter (Signed)
-----  Message from Paula Ross V, MD sent at 11/18/2021  1:42 PM EST ----- Fluid is up significantly    Cr has stayed the same     I would go back to 30 mg torsemide every other day again   BMET and BNP next week     Limit salt    2 G Na per day  

## 2021-11-18 NOTE — Telephone Encounter (Signed)
**Note De-Identified  Obfuscation** The pt left her [employer] Pt asst application for Jardiance at the office.  I have completed the providers page of her application and have faxed all to Dr Alan Ripper nurse so she can print a Jardiance 10 mg prescription for #90 with 3 refills, have Dr Harrington Challenger sign both the application and the prescription, date both, and so she can fax all to [place] at the fax number written on the cover letter included or to place in the to be faxed basket in Medical Records to be faxed.

## 2021-11-18 NOTE — Telephone Encounter (Signed)
See other telephone encounter today for further documentation on this.

## 2021-11-18 NOTE — Telephone Encounter (Signed)
Pts son returning phone call... please advise.

## 2021-11-19 ENCOUNTER — Other Ambulatory Visit: Payer: Self-pay | Admitting: Internal Medicine

## 2021-11-19 ENCOUNTER — Telehealth: Payer: Self-pay

## 2021-11-19 MED ORDER — TORSEMIDE 20 MG PO TABS: 30.0000 mg | ORAL_TABLET | ORAL | 3 refills | Status: DC

## 2021-11-19 NOTE — Telephone Encounter (Signed)
Called pt to review results and MD recommendations.  Pt unable to remember instructions, phone given to son [name].  I reviewed results and MD recommendations with son he verbalizes understanding.  Son denies increase sodium intake, I advised of 2000 mg or less salt/ sodium per day.  Pt to take torsemide 30 mg PO QOD and have f/u labs drawn at 11/26/21 OV with Dr. Harrington Challenger.

## 2021-11-19 NOTE — Telephone Encounter (Signed)
-----  Message from [name] V, MD sent at 11/18/2021  1:42 PM EST ----- Fluid is up significantly    Cr has stayed the same     I would go back to 30 mg torsemide every other day again   BMET and BNP next week     Limit salt    2 G Na per day

## 2021-11-25 MED ORDER — EMPAGLIFLOZIN 10 MG PO TABS: 10.0000 mg | ORAL_TABLET | Freq: Every day | ORAL | 3 refills | Status: DC

## 2021-11-25 NOTE — Telephone Encounter (Signed)
Done  Please print a Jardiance 10 mg RX for #90 with 3 refills, have Dr Harrington Challenger sign and date it and the application and fax all to [facility] or leave in MR to be faxed.  Thanks, Santiago Bur. It is ok to wait until Dr Harrington Challenger is back in the office on 1/28 to have her sign.

## 2021-11-26 ENCOUNTER — Ambulatory Visit (INDEPENDENT_AMBULATORY_CARE_PROVIDER_SITE_OTHER): Payer: Medicare Other | Admitting: Internal Medicine

## 2021-11-26 ENCOUNTER — Encounter: Payer: Self-pay | Admitting: Internal Medicine

## 2021-11-26 ENCOUNTER — Other Ambulatory Visit: Payer: Self-pay

## 2021-11-26 VITALS — BP 126/64 | HR 86 | Ht 64.0 in | Wt 133.2 lb

## 2021-11-26 DIAGNOSIS — I5032 Chronic diastolic (congestive) heart failure: Secondary | ICD-10-CM | POA: Diagnosis not present

## 2021-11-26 DIAGNOSIS — I10 Essential (primary) hypertension: Secondary | ICD-10-CM

## 2021-11-26 MED ORDER — ATENOLOL 25 MG PO TABS: ORAL_TABLET | ORAL | 3 refills | Status: DC

## 2021-11-26 NOTE — Progress Notes (Signed)
Cardiology Office Note   Date:  11/26/2021   ID:  [name] Dyer, DOB 03-06-1932, MRN [id number]  PCP:  [name] Cuff, MD  Cardiologist:   [name], MD   Pt presents for f/u of permanent afib and diastolic CHF      History of Present Illness: [name] Dyer is a 87 y.o. female with a history of atrial fibrillation, chronic diastolic CHF, mitral regurgitation, HTN and HL   He was admitted in Feb 2021 with CHF   Echo showed LVEF 55 to 60%; mild to mod MR I last saw the pt in May 2022   AT the time she was gaining wt, eating less  On exam, volume was up  I recomm adding Jardiance Echo done showing LVEF and RVEF were normal  Lasix dose was adjusted to every third day.  I saw the patient in October.  She continues to have lower extremity edema said her feet felt round.  Denied shortness of breath.  Based on labs I recommended switching to torsemide 20 alternating with 30 every day.  Repeat labs on 11 1 creatinine slightly increased 1.58.  BNP remains elevated at 2356. Weights taken at home 12/29/2018 3-1 28.  Recommended cutting back to 20 mg daily.  Repeat labs on 11/18 Cr was 1.58 again but BNP was significantly higher.  Recommended going back to alternating 20/30.  The patient comes in today with her son.  She denies dizziness.  Does say she gets short of breath.  There was some edema.  Says she feels a thumping in her chest straining sensation.  Feels like her heart jumping. Current Meds  Medication Sig   acetaminophen (TYLENOL) 500 MG tablet Take 1,000 mg by mouth every 6 (six) hours as needed for mild pain or headache.    alendronate (FOSAMAX) 70 MG tablet Take 70 mg by mouth every Monday.    atenolol (TENORMIN) 25 MG tablet Take 1 tablet twice a day   betamethasone dipropionate 0.05 % lotion Apply topically.   cholecalciferol (VITAMIN D3) 25 MCG (1000 UT) tablet Take 2,000 Units by mouth daily.   Cyanocobalamin (VITAMIN B-12) 5000 MCG TBDP Take 5,000 mcg by mouth daily.     donepezil (ARICEPT) 10 MG tablet Take 1 tablet (10 mg total) by mouth at bedtime.   empagliflozin (JARDIANCE) 10 MG TABS tablet Take 1 tablet (10 mg total) by mouth daily before breakfast.   gabapentin (NEURONTIN) 300 MG capsule Take 300 mg by mouth 3 (three) times daily.   ipratropium (ATROVENT) 0.06 % nasal spray CAN USE 2 SPRAYS IN EACH NOSTRIL EVERY 6 HOURS IF NEEDED TO DRY UP RUNNY N0SE   memantine (NAMENDA) 5 MG tablet Take 1 tablet (5 mg total) by mouth 2 (two) times daily.   Menthol, Topical Analgesic, (BIOFREEZE EX) Apply topically.   potassium chloride (KLOR-CON) 10 MEQ tablet TAKE 1 TABLET BY MOUTH 2 TIMES DAILY   Rivaroxaban (XARELTO) 15 MG TABS tablet Take 1 tablet (15 mg total) by mouth daily with supper.   simvastatin (ZOCOR) 10 MG tablet Take 10 mg by mouth at bedtime.    torsemide (DEMADEX) 20 MG tablet Take 1.5 tablets (30 mg total) by mouth every other day.   traZODone (DESYREL) 50 MG tablet Take by mouth.     Allergies:   Ciprofloxacin, Sulfa antibiotics, Memantine, Succinylsulphathiazole, and Sulfamethoxazole   Past Medical History:  Diagnosis Date   Anemia    Arrhythmia    Cataract    Colon polyps  Community acquired pneumonia Feb 2013   LLL   Congestive heart failure ([place])    Hyperlipidemia    Hypertension    Memory difficulty 10/01/2016   Osteoporosis    Pleural effusion, right Feb 2013   SVT (supraventricular tachycardia) ([place]) feb 2013   UTI (lower urinary tract infection)     Past Surgical History:  Procedure Laterality Date   [place]   COLONOSCOPY     COLONOSCOPY N/A 06/10/2016   Procedure: COLONOSCOPY;  Surgeon: Manus Gunning, MD;  Location: [facility];  Service: Gastroenterology;  Laterality: N/A;   ESOPHAGOGASTRODUODENOSCOPY N/A 06/10/2016   Procedure: ESOPHAGOGASTRODUODENOSCOPY (EGD);  Surgeon: Manus Gunning, MD;  Location: [facility];  Service: Gastroenterology;  Laterality: N/A;    HEMIARTHROPLASTY HIP  2011   Left femoral neck fracture   UPPER GASTROINTESTINAL ENDOSCOPY       Social History:  The patient  reports that she has never smoked. She has never used smokeless tobacco. She reports that she does not drink alcohol and does not use drugs.   Family History:  The patient's family history includes Cancer in her grandchild; Colon polyps in her son; Stroke in her father.    ROS:  Please see the history of present illness. All other systems are reviewed and  Negative to the above problem except as noted.    PHYSICAL EXAM: VS:  BP 126/64   Pulse 86   Ht _0  (1.626 m)   Wt 133 lb 3.2 oz (60.4 kg)   SpO2 99%   BMI 22.86 kg/m   GEN: Patient is in no acute distress  HEENT: normal  Neck: JVP is increased Cardiac: Irreg irreg  ; no murmurs;   1+  edema.  Patient has support socks on Respiratory:  clear to auscultation bilaterally,  GI: soft, nontender, nondistended, + BS  MS: no deformity Moving all extremities   Skin: warm and dry Neuro:   Grossly intact Psych: euthymic mood, full affect   EKG:  EKG was not  ordered today.   Lipid Panel    Component Value Date/Time   CHOL 140 05/17/2021 1411   TRIG 95 05/17/2021 1411   HDL 45 05/17/2021 1411   CHOLHDL 3.1 05/17/2021 1411   LDLCALC 77 05/17/2021 1411      Wt Readings from Last 3 Encounters:  11/26/21 133 lb 3.2 oz (60.4 kg)  10/17/21 135 lb (61.2 kg)  07/02/21 131 lb (59.4 kg)      ASSESSMENT AND PLAN:  1  Acute on chronic diastolic CHF.  Echo in June 2022, LVEF and RVEF were normal.  Today her   volume status remains elevated.  She has switched from Lasix to torsemide.  Adjusted dosing.  Question if she is having more rapid A. fib with this shocking sensation in her chest.  I recommended she increase her beta-blocker a bit.  We will get labs today.  This will help in deciding torsemide dosing.  Again discussed fluid and salt restriction.  2  Permanent atrial fibrillation   keep on  Xarelto.  Increase beta-blocker a as noted. 3  HTN  BP is controlled.  Continue to follow   4  HL   Continue statin   LDL 77 and HDL 45 in May     Current medicines are reviewed at length with the patient today.  The patient does not have concerns regarding medicines.  Signed, [name], MD  11/26/2021 1:57 PM  [facility] [place], [place], [place]  [address] Phone: [phone]; Fax: [phone]

## 2021-11-26 NOTE — Patient Instructions (Signed)
Medication Instructions:  Your physician has recommended you make the following change in your medication:  TAKE: Atenolol 50 mg by mouth daily in the morning  Atenolol 25 mg by mouth daily at night  *If you need a refill on your cardiac medications before your next appointment, please call your pharmacy*   Lab Work: TODAY: CBC, BNP, BMET If you have labs (blood work) drawn today and your tests are completely normal, you will receive your results only by: [place] (if you have MyChart) OR A paper copy in the mail If you have any lab test that is abnormal or we need to change your treatment, we will call you to review the results.   Testing/Procedures: NONE   Follow-Up: To be determined At [facility], you and your health needs are our priority.  As part of our continuing mission to provide you with exceptional heart care, we have created designated Provider Care Teams.  These Care Teams include your primary Cardiologist (physician) and Advanced Practice Providers (APPs -  Physician Assistants and Nurse Practitioners) who all work together to provide you with the care you need, when you need it.  We recommend signing up for the patient portal called "MyChart".  Sign up information is provided on this After Visit Summary.  MyChart is used to connect with patients for Virtual Visits (Telemedicine).  Patients are able to view lab/test results, encounter notes, upcoming appointments, etc.  Non-urgent messages can be sent to your provider as well.   To learn more about what you can do with MyChart, go to [email or web address].

## 2021-11-27 LAB — BASIC METABOLIC PANEL
BUN/Creatinine Ratio: 24 (ref 12–28)
BUN: 37 mg/dL — ABNORMAL HIGH (ref 8–27)
CO2: 21 mmol/L (ref 20–29)
Calcium: 9.3 mg/dL (ref 8.7–10.3)
Chloride: 101 mmol/L (ref 96–106)
Creatinine, Ser: 1.52 mg/dL — ABNORMAL HIGH (ref 0.57–1.00)
Glucose: 90 mg/dL (ref 70–99)
Potassium: 5 mmol/L (ref 3.5–5.2)
Sodium: 142 mmol/L (ref 134–144)
eGFR: 33 mL/min/{1.73_m2} — ABNORMAL LOW (ref >59)

## 2021-11-27 LAB — CBC
Hematocrit: 39.3 % (ref 34.0–46.6)
Hemoglobin: 12.7 g/dL (ref 11.1–15.9)
MCH: 29.5 pg (ref 26.6–33.0)
MCHC: 32.3 g/dL (ref 31.5–35.7)
MCV: 91 fL (ref 79–97)
Platelets: 145 10*3/uL — ABNORMAL LOW (ref 150–450)
RBC: 4.31 x10E6/uL (ref 3.77–5.28)
RDW: 14.8 % (ref 11.7–15.4)
WBC: 6.9 10*3/uL (ref 3.4–10.8)

## 2021-11-27 LAB — PRO B NATRIURETIC PEPTIDE: NT-Pro BNP: 2340 pg/mL — ABNORMAL HIGH (ref 0–738)

## 2021-11-28 ENCOUNTER — Telehealth: Payer: Self-pay

## 2021-11-28 DIAGNOSIS — I5032 Chronic diastolic (congestive) heart failure: Secondary | ICD-10-CM

## 2021-11-28 MED ORDER — TORSEMIDE 20 MG PO TABS: ORAL_TABLET | ORAL | 3 refills | Status: DC

## 2021-11-28 MED ORDER — POTASSIUM CHLORIDE ER 10 MEQ PO TBCR: EXTENDED_RELEASE_TABLET | ORAL | 3 refills | Status: DC

## 2021-11-28 NOTE — Telephone Encounter (Signed)
-----  Message from [name], MD sent at 11/27/2021  8:13 PM EST ----- CBC is normal FLuid levels appear a little better than last check  Still elevated Kidney function is relatively stable  I would recomm again 20 mg  alternating with 30 mg  torsemide    (1 tab then 1.5 tab)   On day take 20 mg take 1 KCLtablet   2 on tother days F/U BMET and BNP in 2 wks

## 2021-11-28 NOTE — Telephone Encounter (Signed)
Outreach made to Pt.  Had to speak with son to give instructions.  [name] son verbalizes understanding.  Medications sent to pharmacy.    Scheduled labs for December 11, 2021.

## 2021-12-02 ENCOUNTER — Telehealth: Payer: Self-pay | Admitting: Internal Medicine

## 2021-12-02 NOTE — Telephone Encounter (Signed)
Gindlesperger Jr,[name] [phone]  Pts son called to report that she has been taking the 2 Atenolol in the morning and one at night and she feels the "shocking" sensation has improved but she feels worn out by lunch time and improves as the day goes on. Per her sons request she will try taking it TID for a few days and call us back on Friday and let us know how she is doing.

## 2021-12-02 NOTE — Telephone Encounter (Signed)
   Pt c/o medication issue:  1. Name of Medication: atenolol (TENORMIN) 25 MG tablet  2. How are you currently taking this medication (dosage and times per day)? Take 50 mg (2 tablets) in the morning and 25 mg (1 tablet) at night  3. Are you having a reaction (difficulty breathing--STAT)?   4. What is your medication issue? [name] son is calling, he said this meds was increased by Dr. Harrington Challenger and pt is taking 2 tablets in the morning and 1 at night, he said when pt takes it 2 in the morning opt gets dizzy and not feeling well, he asked if pt can take this meds 3x a day instead

## 2021-12-03 ENCOUNTER — Other Ambulatory Visit: Payer: Medicare Other

## 2021-12-06 ENCOUNTER — Telehealth: Payer: Self-pay | Admitting: Internal Medicine

## 2021-12-06 NOTE — Telephone Encounter (Signed)
See other open encounter.

## 2021-12-06 NOTE — Telephone Encounter (Signed)
Spoke with the pts son and he says the pt has shown some improvement with her dizziness since taking the Atenolol TID as opposed to 2 in the morning and 1 in the evening.   He says she just appears to be getting deconditioned, she is not walking to the mailbox as easy as she used to.. she gets rea;;y tired without much exertion.   He is not happy with her home visiting PCP Dr. Daphene Jaeger.   He is plans to pursue a new PCP soon... he is thinking about his wife's PCP and I also offered him the number for [facility] with [facility] for Primary care.   She is having repeat labs 12/12/21.   Her BP today 134/86 and 121/77        HR 89 and 84.   He will continue to monitor her and let us know if any changes.   Her next OV is 01/29/22 but he will let us know if he would like to bring her ion sooner.

## 2021-12-06 NOTE — Telephone Encounter (Signed)
[name] son said the nurse called this morning around 8:30am and said she would callback within an hour and he hadnt heard back yet.

## 2021-12-11 ENCOUNTER — Other Ambulatory Visit: Payer: Medicare Other

## 2021-12-12 ENCOUNTER — Other Ambulatory Visit: Payer: Self-pay

## 2021-12-12 ENCOUNTER — Other Ambulatory Visit: Payer: Medicare Other

## 2021-12-12 DIAGNOSIS — I5032 Chronic diastolic (congestive) heart failure: Secondary | ICD-10-CM

## 2021-12-13 ENCOUNTER — Telehealth: Payer: Self-pay

## 2021-12-13 LAB — BASIC METABOLIC PANEL
BUN/Creatinine Ratio: 24 (ref 12–28)
BUN: 47 mg/dL — ABNORMAL HIGH (ref 8–27)
CO2: 22 mmol/L (ref 20–29)
Calcium: 9 mg/dL (ref 8.7–10.3)
Chloride: 97 mmol/L (ref 96–106)
Creatinine, Ser: 1.96 mg/dL — ABNORMAL HIGH (ref 0.57–1.00)
Glucose: 86 mg/dL (ref 70–99)
Potassium: 4.6 mmol/L (ref 3.5–5.2)
Sodium: 140 mmol/L (ref 134–144)
eGFR: 24 mL/min/{1.73_m2} — ABNORMAL LOW (ref >59)

## 2021-12-13 LAB — PRO B NATRIURETIC PEPTIDE: NT-Pro BNP: 4092 pg/mL — ABNORMAL HIGH (ref 0–738)

## 2021-12-13 MED ORDER — TORSEMIDE 20 MG PO TABS: 20.0000 mg | ORAL_TABLET | Freq: Every day | ORAL | 3 refills | Status: DC

## 2021-12-13 NOTE — Telephone Encounter (Signed)
-----  Message from [name], MD sent at 12/13/2021  9:25 AM EST ----- Fluid number is higher    DIastolic CHF   Echo OK in June Renal function not as good   I would cut back on Torsemide to 20 daily    (no 30) She really needs to get seen in clinic sooner    Not able to assess volume   ? If needs IV diuresis in hospital     WOuld she be candidate for [facility] clinic?

## 2021-12-13 NOTE — Telephone Encounter (Signed)
Outreach made to son [name] per [employer].  Aware of torsemide change.  Advised to keep potassium schedule as is until appointment on Monday.  Scheduled with DOD to assess fluid/worsening creatinine.

## 2021-12-16 ENCOUNTER — Other Ambulatory Visit: Payer: Self-pay

## 2021-12-16 ENCOUNTER — Encounter: Payer: Self-pay | Admitting: Interventional Cardiology

## 2021-12-16 ENCOUNTER — Ambulatory Visit (INDEPENDENT_AMBULATORY_CARE_PROVIDER_SITE_OTHER): Payer: Medicare Other | Admitting: Interventional Cardiology

## 2021-12-16 VITALS — BP 130/60 | HR 84 | Ht 64.0 in | Wt 135.4 lb

## 2021-12-16 DIAGNOSIS — R5383 Other fatigue: Secondary | ICD-10-CM

## 2021-12-16 DIAGNOSIS — Z79899 Other long term (current) drug therapy: Secondary | ICD-10-CM | POA: Diagnosis not present

## 2021-12-16 DIAGNOSIS — N184 Chronic kidney disease, stage 4 (severe): Secondary | ICD-10-CM

## 2021-12-16 DIAGNOSIS — I4821 Permanent atrial fibrillation: Secondary | ICD-10-CM

## 2021-12-16 DIAGNOSIS — R0609 Other forms of dyspnea: Secondary | ICD-10-CM

## 2021-12-16 DIAGNOSIS — I5032 Chronic diastolic (congestive) heart failure: Secondary | ICD-10-CM | POA: Diagnosis not present

## 2021-12-16 DIAGNOSIS — I10 Essential (primary) hypertension: Secondary | ICD-10-CM

## 2021-12-16 NOTE — H&P (View-Only) (Signed)
no

## 2021-12-16 NOTE — Progress Notes (Signed)
Cardiology Office Note:    Date:  12/16/2021   ID:  [name] Dyer, DOB [birth year], MRN [id number]  PCP:  [name], MD  Cardiologist:  [name], MD   Referring MD: [name], MD   Chief Complaint  Patient presents with   Shortness of Breath    History of Present Illness:    AINARA Dyer is a 87 y.o. female with a hx of  atrial fibrillation, chronic diastolic CHF, mitral regurgitation, HTN and HL   He was admitted in Feb 2021 with CHF   Echo showed LVEF 55 to 60%; mild to mod MR  Dr. Harrington Challenger requested that the patient be seen to assess volume status.  Torsemide dose has been intermittently changed as an outpatient based upon lower extremity edema and subjective complaint of dyspnea.  She is accompanied today by her son who manages her medication.  Patient admits that her memory is decreasing.  She complains of having intermittent nosebleeds.  She feels weak and shaky most of the time.  She is changing her primary physician to [facility] from Dr. Leonarda Salon.  She has dyspnea on exertion.  She is able to lay flat without shortness of breath.  She is not short of breath at rest.  Laboratory data have shown a progressive increase in creatinine since torsemide was started in October November timeframe from a near normal creatinine of 1.24 up to 1.95 most recently on December 15.  Past Medical History:  Diagnosis Date   Alzheimer disease ([place])    Anemia    Arrhythmia    Cataract    Colon polyps    Community acquired pneumonia 01/2012   LLL   Congestive heart failure ([place])    Hyperlipidemia    Hypertension    Memory difficulty 10/01/2016   Osteoporosis    Pleural effusion, right 01/2012   SVT (supraventricular tachycardia) ([place]) 01/2012   UTI (lower urinary tract infection)     Past Surgical History:  Procedure Laterality Date   [place]   COLONOSCOPY     COLONOSCOPY N/A 06/10/2016   Procedure: COLONOSCOPY;   Surgeon: Manus Gunning, MD;  Location: [facility];  Service: Gastroenterology;  Laterality: N/A;   ESOPHAGOGASTRODUODENOSCOPY N/A 06/10/2016   Procedure: ESOPHAGOGASTRODUODENOSCOPY (EGD);  Surgeon: Manus Gunning, MD;  Location: [facility];  Service: Gastroenterology;  Laterality: N/A;   HEMIARTHROPLASTY HIP  2011   Left femoral neck fracture   UPPER GASTROINTESTINAL ENDOSCOPY      Current Medications: No outpatient medications have been marked as taking for the 12/16/21 encounter (Office Visit) with Belva Crome, MD.     Allergies:   Ciprofloxacin, Sulfa antibiotics, Memantine, Succinylsulphathiazole, and Sulfamethoxazole   Social History   Socioeconomic History   Marital status: Widowed    Spouse name: Not on file   Number of children: 2   Years of education: 28   Highest education level: Not on file  Occupational History   Occupation: Retired  Tobacco Use   Smoking status: Never   Smokeless tobacco: Never  Vaping Use   Vaping Use: Never used  Substance and Sexual Activity   Alcohol use: Never   Drug use: Never   Sexual activity: Not on file  Other Topics Concern   Not on file  Social History Narrative   Diet: low sodium/ fluid      Caffeine:Green tea, cheerwine,chocolate      Married, if yes what year: [phone]  Do you live in a house, apartment, assisted living, condo, trailer, ect: House      Is it one or more stories: One      How many persons live in your home? 1      Pets: No      Highest level or education completed: High School      Current/Past profession: [employer]      Exercise: Yes                 Type and how often: Walking, strength training          Living Will: Yes    DNR: No   POA/HPOA: Yes      Functional Status:   Do you have difficulty bathing or dressing yourself? No   Do you have difficulty preparing food or eating? No   Do you have difficulty managing your medications? Yes   Do you  have difficulty managing your finances? Yes   Do you have difficulty affording your medications? Yes   Social Determinants of Health   Financial Resource Strain: Not on file  Food Insecurity: Not on file  Transportation Needs: Not on file  Physical Activity: Not on file  Stress: Not on file  Social Connections: Not on file     Family History: The patient's family history includes Cancer in her grandchild; Colon polyps in her son; Heart Problems in her father and mother; Heart disease in her son; Neuropathy in her son; Osteoporosis in her mother; Other (age of onset: 38) in her brother; Parkinson's disease in her son; Stroke in her father.  ROS:   Please see the history of present illness.    Complaints other not specifically cardiac.  No chest pain.  Denies lower extremity swelling but has edema.  She complains of twitching, feeling weak, not feeling like walking around.  All other systems reviewed and are negative.  EKGs/Labs/Other Studies Reviewed:    The following studies were reviewed today:  2D Doppler echocardiogram June 14, 2021 IMPRESSIONS     1. Left ventricular ejection fraction, by estimation, is 55 to 60%. The  left ventricle has normal function. The left ventricle has no regional  wall motion abnormalities. Left ventricular diastolic function could not  be evaluated.   2. Right ventricular systolic function is normal. The right ventricular  size is normal.   3. Left atrial size was moderately dilated.   4. Right atrial size was moderately dilated.   5. The mitral valve is normal in structure. Mild mitral valve  regurgitation. No evidence of mitral stenosis.   6. Tricuspid valve regurgitation is moderate.   7. The aortic valve is tricuspid. Aortic valve regurgitation is trivial.  No aortic stenosis is present.   EKG:  EKG atrial fibrillation with vertical axis, ventricular rate 84 bpm, incomplete right bundle, low voltage, and when compared to 2021 tracing, no  significant changes noted.  Recent Labs: 05/17/2021: ALT 32; TSH 3.330 11/26/2021: Hemoglobin 12.7; Platelets 145 12/12/2021: BUN 47; Creatinine, Ser 1.96; NT-Pro BNP 4,092; Potassium 4.6; Sodium 140  Recent Lipid Panel    Component Value Date/Time   CHOL 140 05/17/2021 1411   TRIG 95 05/17/2021 1411   HDL 45 05/17/2021 1411   CHOLHDL 3.1 05/17/2021 1411   LDLCALC 77 05/17/2021 1411    Physical Exam:    VS:  BP 130/60    Pulse 84    Ht _0  (1.626 m)    Wt 135  lb 6.4 oz (61.4 kg)    SpO2 97%    BMI 23.24 kg/m     Wt Readings from Last 3 Encounters:  12/16/21 135 lb 6.4 oz (61.4 kg)  11/26/21 133 lb 3.2 oz (60.4 kg)  10/17/21 135 lb (61.2 kg)     GEN: Elderly and frail. No acute distress HEENT: Normal NECK: Left jugular reveals CV wave to the angle of the jaw with the patient lying at 45 degrees. LYMPHATICS: No lymphadenopathy CARDIAC: Soft 2/6 systolic murmur. IIRR no gallop, with bilateral ankle to mid shin edema. VASCULAR:  Normal Pulses. No bruits. RESPIRATORY:  Clear to auscultation without rales, wheezing or rhonchi  ABDOMEN: Soft, non-tender, non-distended, No pulsatile mass, MUSCULOSKELETAL: No deformity  SKIN: Warm and dry NEUROLOGIC:  Alert and oriented x 3 PSYCHIATRIC:  Normal affect   ASSESSMENT:    1. Chronic diastolic CHF (congestive heart failure) ([place])   2. Essential hypertension   3. Medication management   4. DOE (dyspnea on exertion)   5. Permanent atrial fibrillation ([place])   6. Fatigue, unspecified type   7. CKD (chronic kidney disease), stage IV (HCC)    PLAN:    In order of problems listed above:  BNP is elevated, CV wave is noted with the patient lying at 45 degrees but everything else suggests that the patient is volume contracted including progressive increase in creatinine since diuretic intensity increased, the absence of orthopnea, and no major complaint of dyspnea.  Decided to discontinue empagliflozin and torsemide.  Bmet in BNP  today.  Repeat in 1 week.  Weight daily and let us know if weight dramatically increases. Orthostatic blood pressure recordings reveal 135/60 mmHg sitting dropping to 120/55 mmHg upon standing. She is on multiple medications including therapy for dementia.\ Dyspnea on exertion is not a major complaint. Permanent atrial fibrillation with moderate rate control.  Consider decreasing atenolol. Multifactorial in etiology. New since addition of empagliflozin and torsemide.  We will hold these to see if kidney function improves.   We will see how she does off of diuretic therapy for at least a week.  Hopefully kidney function will improve.  Follow-up in 4 weeks.  BNP and BMP in 1 week.   Medication Adjustments/Labs and Tests Ordered: Current medicines are reviewed at length with the patient today.  Concerns regarding medicines are outlined above.  Orders Placed This Encounter  Procedures   Basic metabolic panel   Pro b natriuretic peptide   Basic metabolic panel   Pro b natriuretic peptide   EKG 12-Lead   No orders of the defined types were placed in this encounter.   Patient Instructions  Medication Instructions:  1) STOP Jardiance 2) STOP Torsemide  *If you need a refill on your cardiac medications before your next appointment, please call your pharmacy*   Lab Work: BMET and Pro BNP today  BMET and Pro BNP in 1 week  If you have labs (blood work) drawn today and your tests are completely normal, you will receive your results only by: [place] (if you have MyChart) OR A paper copy in the mail If you have any lab test that is abnormal or we need to change your treatment, we will call you to review the results.   Testing/Procedures: None   Follow-Up: At [facility], you and your health needs are our priority.  As part of our continuing mission to provide you with exceptional heart care, we have created designated Provider Care Teams.  These Care Teams  include  your primary Cardiologist (physician) and Advanced Practice Providers (APPs -  Physician Assistants and Nurse Practitioners) who all work together to provide you with the care you need, when you need it.  We recommend signing up for the patient portal called "MyChart".  Sign up information is provided on this After Visit Summary.  MyChart is used to connect with patients for Virtual Visits (Telemedicine).  Patients are able to view lab/test results, encounter notes, upcoming appointments, etc.  Non-urgent messages can be sent to your provider as well.   To learn more about what you can do with MyChart, go to [email or web address].    Your next appointment:   1 month(s)  The format for your next appointment:   In Person  Provider:   [name], MD     Other Instructions     Signed, Sinclair Grooms, MD  12/16/2021 4:33 PM    [place]

## 2021-12-16 NOTE — Progress Notes (Addendum)
My Note Signed     12/16/2021   Addend Delete Copy   Expand All Collapse All  Cardiology Office Note:     Date:  12/16/2021    ID:  [name] Dyer, DOB 12-10-1932, MRN [id number]   PCP:  [name], MD        Cardiologist:  [name], MD    Referring MD: [name], MD       Chief Complaint  Patient presents with   Shortness of Breath      History of Present Illness:     [name] Dyer is a 87 y.o. female with a hx of  atrial fibrillation, chronic diastolic CHF, mitral regurgitation, HTN and HL   He was admitted in Feb 2021 with CHF   Echo showed LVEF 55 to 60%; mild to mod MR   Dr. Harrington Challenger requested that the patient be seen to assess volume status.  Torsemide dose has been intermittently changed as an outpatient based upon lower extremity edema and subjective complaint of dyspnea.  She is accompanied today by her son who manages her medication.   Patient admits that her memory is decreasing.  She complains of having intermittent nosebleeds.  She feels weak and shaky most of the time.  She is changing her primary physician to [facility] from Dr. Leonarda Salon.  She has dyspnea on exertion.  She is able to lay flat without shortness of breath.  She is not short of breath at rest.  Laboratory data have shown a progressive increase in creatinine since torsemide was started in October November timeframe from a near normal creatinine of 1.24 up to 1.95 most recently on December 15.       Past Medical History:  Diagnosis Date   Alzheimer disease ([place])     Anemia     Arrhythmia     Cataract     Colon polyps     Community acquired pneumonia 01/2012    LLL   Congestive heart failure ([place])     Hyperlipidemia     Hypertension     Memory difficulty 10/01/2016   Osteoporosis     Pleural effusion, right 01/2012   SVT (supraventricular tachycardia) ([place]) 01/2012   UTI (lower urinary tract infection)             Past Surgical History:  Procedure Laterality Date    [place]   COLONOSCOPY       COLONOSCOPY N/A 06/10/2016    Procedure: COLONOSCOPY;  Surgeon: Manus Gunning, MD;  Location: [facility];  Service: Gastroenterology;  Laterality: N/A;   ESOPHAGOGASTRODUODENOSCOPY N/A 06/10/2016    Procedure: ESOPHAGOGASTRODUODENOSCOPY (EGD);  Surgeon: Manus Gunning, MD;  Location: [facility];  Service: Gastroenterology;  Laterality: N/A;   HEMIARTHROPLASTY HIP   2011    Left femoral neck fracture   UPPER GASTROINTESTINAL ENDOSCOPY          Current Medications: Active Medications  No outpatient medications have been marked as taking for the 12/16/21 encounter (Office Visit) with Belva Crome, MD.        Allergies:   Ciprofloxacin, Sulfa antibiotics, Memantine, Succinylsulphathiazole, and Sulfamethoxazole    Social History         Socioeconomic History   Marital status: Widowed      Spouse name: Not on file   Number of children: 2   Years of education: 77   Highest education level: Not on file  Occupational History   Occupation: Retired  Tobacco Use   Smoking status: Never   Smokeless tobacco: Never  Vaping Use   Vaping Use: Never used  Substance and Sexual Activity   Alcohol use: Never   Drug use: Never   Sexual activity: Not on file  Other Topics Concern   Not on file  Social History Narrative    Diet: low sodium/ fluid         Caffeine:Green tea, cheerwine,chocolate         Married, if yes what year: [phone]         Do you live in a house, apartment, assisted living, condo, trailer, ect: House         Is it one or more stories: One         How many persons live in your home? 1         Pets: No         Highest level or education completed: High School         Current/Past profession: [employer]         Exercise: Yes                 Type and how often: Walking, strength training               Living Will: Yes     DNR: No    POA/HPOA:  Yes         Functional Status:    Do you have difficulty bathing or dressing yourself? No    Do you have difficulty preparing food or eating? No    Do you have difficulty managing your medications? Yes    Do you have difficulty managing your finances? Yes    Do you have difficulty affording your medications? Yes    Social Determinants of Health    Financial Resource Strain: Not on file  Food Insecurity: Not on file  Transportation Needs: Not on file  Physical Activity: Not on file  Stress: Not on file  Social Connections: Not on file      Family History: The patient's family history includes Cancer in her grandchild; Colon polyps in her son; Heart Problems in her father and mother; Heart disease in her son; Neuropathy in her son; Osteoporosis in her mother; Other (age of onset: 18) in her brother; Parkinson's disease in her son; Stroke in her father.   ROS:   Please see the history of present illness.    Complaints other not specifically cardiac.  No chest pain.  Denies lower extremity swelling but has edema.  She complains of twitching, feeling weak, not feeling like walking around.  All other systems reviewed and are negative.   EKGs/Labs/Other Studies Reviewed:     The following studies were reviewed today:   2D Doppler echocardiogram June 14, 2021 IMPRESSIONS     1. Left ventricular ejection fraction, by estimation, is 55 to 60%. The  left ventricle has normal function. The left ventricle has no regional  wall motion abnormalities. Left ventricular diastolic function could not  be evaluated.   2. Right ventricular systolic function is normal. The right ventricular  size is normal.   3. Left atrial size was moderately dilated.   4. Right atrial size was moderately dilated.   5. The mitral valve is normal in structure. Mild mitral valve  regurgitation. No evidence of mitral stenosis.   6. Tricuspid valve regurgitation is moderate.  7. The aortic valve is tricuspid.  Aortic valve regurgitation is trivial.  No aortic stenosis is present.    EKG:  EKG atrial fibrillation with vertical axis, ventricular rate 84 bpm, incomplete right bundle, low voltage, and when compared to 2021 tracing, no significant changes noted.   Recent Labs: 05/17/2021: ALT 32; TSH 3.330 11/26/2021: Hemoglobin 12.7; Platelets 145 12/12/2021: BUN 47; Creatinine, Ser 1.96; NT-Pro BNP 4,092; Potassium 4.6; Sodium 140  Recent Lipid Panel Labs (Brief)          Component Value Date/Time    CHOL 140 05/17/2021 1411    TRIG 95 05/17/2021 1411    HDL 45 05/17/2021 1411    CHOLHDL 3.1 05/17/2021 1411    LDLCALC 77 05/17/2021 1411        Physical Exam:     VS:  BP 130/60    Pulse 84    Ht _0  (1.626 m)    Wt 135 lb 6.4 oz (61.4 kg)    SpO2 97%    BMI 23.24 kg/m         Wt Readings from Last 3 Encounters:  12/16/21 135 lb 6.4 oz (61.4 kg)  11/26/21 133 lb 3.2 oz (60.4 kg)  10/17/21 135 lb (61.2 kg)      GEN: Elderly and frail. No acute distress HEENT: Normal NECK: Left jugular reveals CV wave to the angle of the jaw with the patient lying at 45 degrees. LYMPHATICS: No lymphadenopathy CARDIAC: Soft 2/6 systolic murmur. IIRR no gallop, with bilateral ankle to mid shin edema. VASCULAR:  Normal Pulses. No bruits. RESPIRATORY:  Clear to auscultation without rales, wheezing or rhonchi  ABDOMEN: Soft, non-tender, non-distended, No pulsatile mass, MUSCULOSKELETAL: No deformity  SKIN: Warm and dry NEUROLOGIC:  Alert and oriented x 3 PSYCHIATRIC:  Normal affect    ASSESSMENT:     1. Chronic diastolic CHF (congestive heart failure) ([place])   2. Essential hypertension   3. Medication management   4. DOE (dyspnea on exertion)   5. Permanent atrial fibrillation ([place])   6. Fatigue, unspecified type   7. CKD (chronic kidney disease), stage IV (HCC)     PLAN:     In order of problems listed above:   BNP is elevated, CV wave is noted with the patient lying at 45 degrees but  everything else suggests that the patient is volume contracted including progressive increase in creatinine since diuretic intensity increased, the absence of orthopnea, and no major complaint of dyspnea.  Decided to discontinue empagliflozin and torsemide.  Bmet in BNP today.  Repeat in 1 week.  Weight daily and let us know if weight dramatically increases. Orthostatic blood pressure recordings reveal 135/60 mmHg sitting dropping to 120/55 mmHg upon standing. She is on multiple medications including therapy for dementia.\ Dyspnea on exertion is not a major complaint. Permanent atrial fibrillation with moderate rate control.  Consider decreasing atenolol. Multifactorial in etiology. New since addition of empagliflozin and torsemide.  We will hold these to see if kidney function improves.     We will see how she does off of diuretic therapy for at least a week.  Hopefully kidney function will improve.   Follow-up in 4 weeks.   BNP and BMP in 1 week.     Medication Adjustments/Labs and Tests Ordered: Current medicines are reviewed at length with the patient today.  Concerns regarding medicines are outlined above.     Orders Placed This Encounter  Procedures   Basic metabolic panel   Pro  b natriuretic peptide   Basic metabolic panel   Pro b natriuretic peptide   EKG 12-Lead    No orders of the defined types were placed in this encounter.     Patient Instructions  Medication Instructions:  1) STOP Jardiance 2) STOP Torsemide   *If you need a refill on your cardiac medications before your next appointment, please call your pharmacy*     Lab Work: BMET and Pro BNP today   BMET and Pro BNP in 1 week   If you have labs (blood work) drawn today and your tests are completely normal, you will receive your results only by: [place] (if you have MyChart) OR A paper copy in the mail If you have any lab test that is abnormal or we need to change your treatment, we will call you  to review the results.     Testing/Procedures: None     Follow-Up: At [facility], you and your health needs are our priority.  As part of our continuing mission to provide you with exceptional heart care, we have created designated Provider Care Teams.  These Care Teams include your primary Cardiologist (physician) and Advanced Practice Providers (APPs -  Physician Assistants and Nurse Practitioners) who all work together to provide you with the care you need, when you need it.   We recommend signing up for the patient portal called "MyChart".  Sign up information is provided on this After Visit Summary.  MyChart is used to connect with patients for Virtual Visits (Telemedicine).  Patients are able to view lab/test results, encounter notes, upcoming appointments, etc.  Non-urgent messages can be sent to your provider as well.   To learn more about what you can do with MyChart, go to [email or web address].     Your next appointment:   1 month(s)   The format for your next appointment:   In Person   Provider:   [name], MD       Other Instructions      Signed, Sinclair Grooms, MD  12/16/2021 4:33 PM    [place]

## 2021-12-16 NOTE — Patient Instructions (Signed)
Medication Instructions:  1) STOP Jardiance 2) STOP Torsemide  *If you need a refill on your cardiac medications before your next appointment, please call your pharmacy*   Lab Work: BMET and Pro BNP today  BMET and Pro BNP in 1 week  If you have labs (blood work) drawn today and your tests are completely normal, you will receive your results only by: [place] (if you have MyChart) OR A paper copy in the mail If you have any lab test that is abnormal or we need to change your treatment, we will call you to review the results.   Testing/Procedures: None   Follow-Up: At [facility], you and your health needs are our priority.  As part of our continuing mission to provide you with exceptional heart care, we have created designated Provider Care Teams.  These Care Teams include your primary Cardiologist (physician) and Advanced Practice Providers (APPs -  Physician Assistants and Nurse Practitioners) who all work together to provide you with the care you need, when you need it.  We recommend signing up for the patient portal called "MyChart".  Sign up information is provided on this After Visit Summary.  MyChart is used to connect with patients for Virtual Visits (Telemedicine).  Patients are able to view lab/test results, encounter notes, upcoming appointments, etc.  Non-urgent messages can be sent to your provider as well.   To learn more about what you can do with MyChart, go to [email or web address].    Your next appointment:   1 month(s)  The format for your next appointment:   In Person  Provider:   [name], MD     Other Instructions

## 2021-12-17 ENCOUNTER — Encounter: Payer: Self-pay | Admitting: Family

## 2021-12-17 ENCOUNTER — Ambulatory Visit (INDEPENDENT_AMBULATORY_CARE_PROVIDER_SITE_OTHER): Payer: Medicare Other | Admitting: Family

## 2021-12-17 VITALS — BP 100/70 | HR 79 | Temp 96.7°F | Resp 16 | Ht 64.0 in | Wt 134.4 lb

## 2021-12-17 DIAGNOSIS — I4821 Permanent atrial fibrillation: Secondary | ICD-10-CM

## 2021-12-17 DIAGNOSIS — R32 Unspecified urinary incontinence: Secondary | ICD-10-CM

## 2021-12-17 DIAGNOSIS — E782 Mixed hyperlipidemia: Secondary | ICD-10-CM | POA: Diagnosis not present

## 2021-12-17 DIAGNOSIS — F0280 Dementia in other diseases classified elsewhere without behavioral disturbance: Secondary | ICD-10-CM

## 2021-12-17 DIAGNOSIS — R350 Frequency of micturition: Secondary | ICD-10-CM

## 2021-12-17 DIAGNOSIS — I5032 Chronic diastolic (congestive) heart failure: Secondary | ICD-10-CM

## 2021-12-17 DIAGNOSIS — G309 Alzheimer's disease, unspecified: Secondary | ICD-10-CM

## 2021-12-17 DIAGNOSIS — R1312 Dysphagia, oropharyngeal phase: Secondary | ICD-10-CM

## 2021-12-17 DIAGNOSIS — I10 Essential (primary) hypertension: Secondary | ICD-10-CM

## 2021-12-17 DIAGNOSIS — G609 Hereditary and idiopathic neuropathy, unspecified: Secondary | ICD-10-CM

## 2021-12-17 DIAGNOSIS — E538 Deficiency of other specified B group vitamins: Secondary | ICD-10-CM

## 2021-12-17 DIAGNOSIS — M810 Age-related osteoporosis without current pathological fracture: Secondary | ICD-10-CM

## 2021-12-17 DIAGNOSIS — Z1231 Encounter for screening mammogram for malignant neoplasm of breast: Secondary | ICD-10-CM

## 2021-12-17 LAB — PRO B NATRIURETIC PEPTIDE: NT-Pro BNP: 4025 pg/mL — ABNORMAL HIGH (ref 0–738)

## 2021-12-17 LAB — BASIC METABOLIC PANEL
BUN/Creatinine Ratio: 28 (ref 12–28)
BUN: 48 mg/dL — ABNORMAL HIGH (ref 8–27)
CO2: 21 mmol/L (ref 20–29)
Calcium: 9.1 mg/dL (ref 8.7–10.3)
Chloride: 100 mmol/L (ref 96–106)
Creatinine, Ser: 1.73 mg/dL — ABNORMAL HIGH (ref 0.57–1.00)
Glucose: 96 mg/dL (ref 70–99)
Potassium: 4.9 mmol/L (ref 3.5–5.2)
Sodium: 140 mmol/L (ref 134–144)
eGFR: 28 mL/min/{1.73_m2} — ABNORMAL LOW (ref >59)

## 2021-12-17 LAB — POCT URINALYSIS DIPSTICK
Bilirubin, UA: NEGATIVE
Glucose, UA: NEGATIVE
Ketones, UA: POSITIVE
Nitrite, UA: POSITIVE
Protein, UA: POSITIVE — AB
Spec Grav, UA: 1.02 (ref 1.010–1.025)
Urobilinogen, UA: NEGATIVE E.U./dL — AB
pH, UA: 6.5 (ref 5.0–8.0)

## 2021-12-17 NOTE — Progress Notes (Signed)
Provider: Webb Silversmith Marlena Barbato FNP-C   [name], MD  Patient Care Team: [name], MD as PCP - General (Internal Medicine) [name], MD as PCP - Cardiology (Cardiology) [name], MD as Consulting Physician (Allergy and Immunology) [name], DPM as Consulting Physician (Podiatry) [name], MD as Consulting Physician (Obstetrics and Gynecology) [name], MD (Neurology) [name], MD as Consulting Physician (Urology)  Extended Emergency Contact Information Primary Emergency Contact: [name] Dyer of [place] Phone: [phone] Mobile Phone: [phone] Relation: Son Secondary Emergency Contact: [name],[name] Dyer  [name] Dyer of [place] Phone: [phone] Mobile Phone: [phone] Relation: Son  Code Status:  Full Code  Goals of care: Advanced Directive information Advanced Directives 12/17/2021  Does Patient Have a Medical Advance Directive? Yes  Type of [place]amed[place] of [place];Living will;Out of facility DNR (pink MOST or yellow form)  Does patient want to make changes to medical advance directive? No - Patient declined  Copy of [place] in Chart? No - copy requested  Would patient like information on creating a medical advance directive? -     Chief Complaint  Patient presents with   Establish Care    New Patient.    HPI:  Pt is a 87 y.o. female seen today to establish care for medical management of chronic diseases.Has a medical history of Hypertension,Hyperlipidemia,Chronic diastolic,congestive Heart Failure,Alzheimer's dementia, Anemia,Arterial Fibrillation ,Osteoporosis,Peripheral Neuropathy,Vitamin B 12 deficiency,urine incontinent among others.  Follows up with Cardiologist Dr.Paula Harrington Challenger for Afib and CHF She complains of coughing at times when eating. She lives by herself and does own ADLs.Tries to eat a low sodium diet and watches her fluid  intake. Exercises by walking sometimes inside and outside.does strengthening exercises.  Request mammogram to be ordered due to previous hx of left breat lumpectomy.  Has had progressive decline in memory follows up with Neurologist Dr.Keith Jannifer Franklin  Has had urine frequency and incontinence which has worsen recently.sees Urologist Dr.Scott Macdiarmid. No fever or chills.    Past Medical History:  Diagnosis Date   Alzheimer disease ([place])    Anemia    Arrhythmia    Cataract    Colon polyps    Community acquired pneumonia 01/2012   LLL   Congestive heart failure ([place])    Hyperlipidemia    Hypertension    Memory difficulty 10/01/2016   Osteoporosis    Pleural effusion, right 01/2012   SVT (supraventricular tachycardia) ([place]) 01/2012   UTI (lower urinary tract infection)    Past Surgical History:  Procedure Laterality Date   [place]   COLONOSCOPY     COLONOSCOPY N/A 06/10/2016   Procedure: COLONOSCOPY;  Surgeon: Manus Gunning, MD;  Location: [facility]COPY;  Service: Gastroenterology;  Laterality: N/A;   ESOPHAGOGASTRODUODENOSCOPY N/A 06/10/2016   Procedure: ESOPHAGOGASTRODUODENOSCOPY (EGD);  Surgeon: Manus Gunning, MD;  Location: [facility];  Service: Gastroenterology;  Laterality: N/A;   HEMIARTHROPLASTY HIP  2011   Left femoral neck fracture   UPPER GASTROINTESTINAL ENDOSCOPY      Allergies  Allergen Reactions   Ciprofloxacin Diarrhea   Sulfa Antibiotics Hives and Rash    Severe Rash   Memantine Other (See Comments)    dizziness   Succinylsulphathiazole Rash   Sulfamethoxazole Rash    Allergies as of 12/17/2021       Reactions   Ciprofloxacin Diarrhea   Sulfa Antibiotics Hives, Rash   Severe Rash   Memantine  Other (See Comments)   dizziness   Succinylsulphathiazole Rash   Sulfamethoxazole Rash        Medication List        Accurate as of December 17, 2021 11:20 AM. If you have any questions, ask  your nurse or doctor.          acetaminophen 500 MG tablet Commonly known as: TYLENOL Take 1,000 mg by mouth every 6 (six) hours as needed for mild pain or headache.   alendronate 70 MG tablet Commonly known as: FOSAMAX Take 70 mg by mouth every Monday.   Alpha-Lipoic Acid 200 MG Tabs Take 200 mg by mouth 3 (three) times daily.   atenolol 25 MG tablet Commonly known as: TENORMIN Take 50 mg (2 tablets) in the morning and 25 mg (1 tablet) at night   BIOFREEZE EX Apply topically daily as needed.   CALCIUM 600 PO Take 600 mg by mouth in the morning.   cholecalciferol 25 MCG (1000 UNIT) tablet Commonly known as: VITAMIN D3 Take 2,000 Units by mouth daily.   donepezil 10 MG tablet Commonly known as: ARICEPT Take 1 tablet (10 mg total) by mouth at bedtime.   gabapentin 300 MG capsule Commonly known as: NEURONTIN Take 300 mg by mouth 3 (three) times daily.   ipratropium 0.06 % nasal spray Commonly known as: ATROVENT CAN USE 2 SPRAYS IN EACH NOSTRIL EVERY 6 HOURS IF NEEDED TO DRY UP RUNNY N0SE   Melatonin 10 MG Tabs Take 10-20 mg by mouth at bedtime.   memantine 5 MG tablet Commonly known as: NAMENDA Take 1 tablet (5 mg total) by mouth 2 (two) times daily.   MULTIPLE VITAMINS/WOMENS PO Take by mouth.   potassium chloride 10 MEQ tablet Commonly known as: KLOR-CON Take one tablet on day you take torsemide 20 mg.  Take 2 tablets on the day you take torsemide 30 mg.   Rivaroxaban 15 MG Tabs tablet Commonly known as: XARELTO Take 1 tablet (15 mg total) by mouth daily with supper.   simvastatin 20 MG tablet Commonly known as: ZOCOR Take 20 mg by mouth daily. What changed: Another medication with the same name was removed. Continue taking this medication, and follow the directions you see here. Changed by: [name], NP   traZODone 50 MG tablet Commonly known as: DESYREL Take by mouth as needed.   Vitamin B-12 5000 MCG Tbdp Take 5,000 mcg by mouth  daily.   vitamin C 250 MG tablet Commonly known as: ASCORBIC ACID Take 250 mg by mouth daily.        Review of Systems  Constitutional:  Negative for appetite change, chills, fatigue, fever and unexpected weight change.  HENT:  Positive for hearing loss. Negative for congestion, dental problem, ear discharge, ear pain, facial swelling, nosebleeds, postnasal drip, rhinorrhea, sinus pressure, sinus pain, sneezing, sore throat, tinnitus and trouble swallowing.        Loss of sense of smell and no taste  Dysphagia   Eyes:  Positive for visual disturbance. Negative for pain, discharge, redness and itching.       Wears eye glasses  Respiratory:  Negative for cough, chest tightness and wheezing.        SOB with exertion   Cardiovascular:  Positive for palpitations. Negative for chest pain and leg swelling.  Gastrointestinal:  Negative for abdominal distention, abdominal pain, blood in stool, constipation, diarrhea, nausea and vomiting.  Endocrine: Negative for cold intolerance, heat intolerance, polydipsia, polyphagia and polyuria.  Genitourinary:  Positive for frequency and urgency. Negative for difficulty urinating, dysuria and flank pain.       Incontinence   Musculoskeletal:  Positive for arthralgias, back pain and gait problem. Negative for joint swelling, myalgias, neck pain and neck stiffness.       Joint stiffness  Osteoporosis   Skin:  Negative for color change, pallor, rash and wound.  Neurological:  Positive for numbness. Negative for syncope, speech difficulty, weakness, light-headedness and headaches.       Chronic dizziness thinks from the medication  neuropathy  Hematological:  Does not bruise/bleed easily.  Psychiatric/Behavioral:  Positive for confusion. Negative for agitation, behavioral problems, hallucinations, self-injury, sleep disturbance and suicidal ideas. The patient is not nervous/anxious.        Memory loss    Immunization History  Administered Date(s)  Administered   Influenza, High Dose Seasonal PF 11/19/2018   Influenza,inj,Quad PF,6+ Mos 11/17/2018   Influenza,inj,quad, With Preservative 10/29/2017   Influenza-Unspecified 10/29/2021   Pertinent  Health Maintenance Due  Topic Date Due   DEXA SCAN  Never done   INFLUENZA VACCINE  Completed   Fall Risk 02/01/2020 02/01/2020 02/01/2020 02/02/2020 12/17/2021  Falls in the past year? - - - - 0  Was there an injury with Fall? - - - - 0  Fall Risk Category Calculator - - - - 0  Fall Risk Category - - - - Low  Patient Fall Risk Level High fall risk High fall risk Moderate fall risk Moderate fall risk Low fall risk  Patient at Risk for Falls Due to - - - - No Fall Risks  Fall risk Follow up - - - - Falls evaluation completed   Functional Status Survey:    Vitals:   12/17/21 1054  BP: 100/70  Pulse: 79  Resp: 16  Temp: (!) 96.7 F (35.9 C)  SpO2: 97%  Weight: 134 lb 6.4 oz (61 kg)  Height: _0  (1.626 m)   Body mass index is 23.07 kg/m. Physical Exam Vitals reviewed.  Constitutional:      General: She is not in acute distress.    Appearance: Normal appearance. She is normal weight. She is not ill-appearing or diaphoretic.  HENT:     Head: Normocephalic.     Right Ear: Tympanic membrane, ear canal and external ear normal. There is no impacted cerumen.     Left Ear: Tympanic membrane, ear canal and external ear normal. There is no impacted cerumen.     Nose: Nose normal. No congestion or rhinorrhea.     Mouth/Throat:     Mouth: Mucous membranes are moist.     Pharynx: Oropharynx is clear. No oropharyngeal exudate or posterior oropharyngeal erythema.  Eyes:     General: No scleral icterus.       Right eye: No discharge.        Left eye: No discharge.     Extraocular Movements: Extraocular movements intact.     Conjunctiva/sclera: Conjunctivae normal.     Pupils: Pupils are equal, round, and reactive to light.  Neck:     Vascular: No carotid bruit.  Cardiovascular:     Rate  and Rhythm: Normal rate and regular rhythm.     Pulses: Normal pulses.     Heart sounds: Normal heart sounds. No murmur heard.   No friction rub. No gallop.  Pulmonary:     Effort: Pulmonary effort is normal. No respiratory distress.     Breath sounds: Normal breath sounds. No wheezing, rhonchi or  rales.  Chest:     Chest wall: No tenderness.  Abdominal:     General: Bowel sounds are normal. There is no distension.     Palpations: Abdomen is soft. There is no mass.     Tenderness: There is no abdominal tenderness. There is no right CVA tenderness, left CVA tenderness, guarding or rebound.  Musculoskeletal:        General: No swelling or tenderness.     Cervical back: Normal range of motion. No rigidity or tenderness.     Right lower leg: No edema.     Left lower leg: No edema.     Comments: Unsteady gait   Lymphadenopathy:     Cervical: No cervical adenopathy.  Skin:    General: Skin is warm and dry.     Coloration: Skin is not pale.     Findings: No bruising, erythema, lesion or rash.  Neurological:     Mental Status: She is alert.     Cranial Nerves: No cranial nerve deficit.     Sensory: No sensory deficit.     Motor: No weakness.     Coordination: Coordination normal.     Gait: Gait abnormal.     Comments: Memory lapse   Psychiatric:        Mood and Affect: Mood normal.        Speech: Speech normal.        Behavior: Behavior normal.        Thought Content: Thought content normal.        Judgment: Judgment normal.    Labs reviewed: Recent Labs    11/26/21 1432 12/12/21 1626 12/16/21 1634  NA 142 140 140  K 5.0 4.6 4.9  CL 101 97 100  CO2 _0 GLUCOSE 90 86 96  BUN 37* 47* 48*  CREATININE 1.52* 1.96* 1.73*  CALCIUM 9.3 9.0 9.1   Recent Labs    05/17/21 1411  AST 30  ALT 32  ALKPHOS 137*  BILITOT 0.7  PROT 6.7  ALBUMIN 4.5   Recent Labs    05/17/21 1411 10/17/21 1255 11/26/21 1432  WBC 7.0 6.6 6.9  HGB 12.2 12.6 12.7  HCT 37.4 39.4 39.3   MCV 96 92 91  PLT CANCELED 100* 145*   Lab Results  Component Value Date   TSH 3.330 05/17/2021   No results found for: HGBA1C Lab Results  Component Value Date   CHOL 140 05/17/2021   HDL 45 05/17/2021   LDLCALC 77 05/17/2021   TRIG 95 05/17/2021   CHOLHDL 3.1 05/17/2021    Significant Diagnostic Results in last 30 days:  No results found.  Assessment/Plan 1. Essential hypertension B/p stable. - continue on atenolol - CBC with Differential/Platelet; Future - TSH; Future  2. Mixed hyperlipidemia LDL available at goal  Continue on simvastatin  - Lipid panel; Future  3. Chronic diastolic CHF (congestive heart failure) (HCC) No signs of fluid overload  - continue to monitor weight  - continue on low sodium diet  - continue to follow up with Cardiologist Dr.Ross   4. Permanent atrial fibrillation (HCC) HR controlled  - continue on Atenolol and Xarelto   5. Idiopathic peripheral neuropathy Continue on Simvastatin   6. Breast cancer screening by mammogram Hx of left breast lumpectomy request mammogram  - MM DIGITAL SCREENING BILATERAL; Future  7. Age-related osteoporosis without current pathological fracture No recent fall or fracture  - continue on calcium and vitamin D supplement  - continue  on alendronate  - continue weight bearing exercise  - DG Bone Density; Future  8. Vitamin B12 deficiency Continue on vitamin B 12 supplement - Vitamin B12; Future  9. Oropharyngeal dysphagia Reports trouble swallowing/coughing at times with meals and swallowing big pills. Refer to speech Therapy for evaluation. - Ambulatory referral to Speech Therapy  10. Urinary frequency Afebrile  - POC Urinalysis Dipstick indicates yellow very cloudy urine with large blood,positive for protein,Nitrites and large leukocytes 3 +  - will send urine for culture  - Urine Culture  11. Urinary incontinence, unspecified type - POC Urinalysis Dipstick - Urine Culture - continue to  follow up with Urologist Dr. Nicki Reaper   12.Alzheimer's disease  No behavioral issues reported  - continue on Aricept and Memantine  - continue with supportive care - continue to follow up with Neurologist Dr.Willis   Family/ staff Communication: Reviewed plan of care with patient verbalized understanding   Labs/tests ordered:  - POC Urinalysis Dipstick - Urine Culture - DG Bone Density; Future - MM DIGITAL SCREENING BILATERAL; Future - CBC with Differential/Platelet - CMP with eGFR([facility]) - TSH - Lipid panel - Vitamin B 12  Next Appointment : 4 months for medical management of chronic issues.Fasting Labs prior tomorrow or in one 1 week.    [name], NP

## 2021-12-20 ENCOUNTER — Other Ambulatory Visit: Payer: Self-pay

## 2021-12-20 ENCOUNTER — Other Ambulatory Visit: Payer: Medicare Other

## 2021-12-20 DIAGNOSIS — E538 Deficiency of other specified B group vitamins: Secondary | ICD-10-CM

## 2021-12-20 DIAGNOSIS — E782 Mixed hyperlipidemia: Secondary | ICD-10-CM

## 2021-12-20 DIAGNOSIS — I10 Essential (primary) hypertension: Secondary | ICD-10-CM

## 2021-12-21 LAB — URINE CULTURE
MICRO NUMBER:: 12782188
SPECIMEN QUALITY:: ADEQUATE

## 2021-12-24 ENCOUNTER — Telehealth: Payer: Self-pay | Admitting: Internal Medicine

## 2021-12-24 ENCOUNTER — Other Ambulatory Visit: Payer: Self-pay

## 2021-12-24 ENCOUNTER — Other Ambulatory Visit: Payer: Medicare Other | Admitting: *Deleted

## 2021-12-24 DIAGNOSIS — I5032 Chronic diastolic (congestive) heart failure: Secondary | ICD-10-CM

## 2021-12-24 DIAGNOSIS — R7989 Other specified abnormal findings of blood chemistry: Secondary | ICD-10-CM

## 2021-12-24 NOTE — Telephone Encounter (Signed)
Returned call to [name] son.  Pt had her torsemide dc'ed at last OV with Dr. Tamala Julian.  Per son Pt has increased weight and swelling.  Asked if her breathing was worse now versus prior to stopping fluid pill.  Per son she may "huff and puff a little more".  Pt has repeat labs scheduled for today.  Advised to get lab work and will review and advise first thing tomorrow morning.  Son indicates understanding.

## 2021-12-24 NOTE — Telephone Encounter (Signed)
Pt c/o swelling: STAT is pt has developed SOB within 24 hours  If swelling, where is the swelling located? Both feet and legs  How much weight have you gained and in what time span? 5 lbs in a week  Have you gained 3 pounds in a day or 5 pounds in a week? Yes   Do you have a log of your daily weights (if so, list)? Yes   Are you currently taking a fluid pill? No   Are you currently SOB? No   Have you traveled recently? No

## 2021-12-25 ENCOUNTER — Ambulatory Visit (HOSPITAL_COMMUNITY)
Admission: RE | Admit: 2021-12-25 | Discharge: 2021-12-25 | Disposition: A | Discharge status: Home / Self Care | Payer: Medicare Other | Source: Ambulatory Visit | Attending: Cardiology | Admitting: Cardiology

## 2021-12-25 ENCOUNTER — Other Ambulatory Visit: Payer: Self-pay

## 2021-12-25 ENCOUNTER — Other Ambulatory Visit: Payer: Medicare Other

## 2021-12-25 VITALS — BP 118/62 | HR 88 | Wt 139.0 lb

## 2021-12-25 DIAGNOSIS — R0602 Shortness of breath: Secondary | ICD-10-CM | POA: Diagnosis not present

## 2021-12-25 DIAGNOSIS — R7989 Other specified abnormal findings of blood chemistry: Secondary | ICD-10-CM

## 2021-12-25 DIAGNOSIS — I5032 Chronic diastolic (congestive) heart failure: Secondary | ICD-10-CM

## 2021-12-25 LAB — BASIC METABOLIC PANEL
BUN/Creatinine Ratio: 20 (ref 12–28)
BUN: 34 mg/dL — ABNORMAL HIGH (ref 8–27)
CO2: 22 mmol/L (ref 20–29)
Calcium: 9 mg/dL (ref 8.7–10.3)
Chloride: 103 mmol/L (ref 96–106)
Creatinine, Ser: 1.73 mg/dL — ABNORMAL HIGH (ref 0.57–1.00)
Glucose: 88 mg/dL (ref 70–99)
Potassium: 5.3 mmol/L — ABNORMAL HIGH (ref 3.5–5.2)
Sodium: 143 mmol/L (ref 134–144)
eGFR: 28 mL/min/{1.73_m2} — ABNORMAL LOW (ref >59)

## 2021-12-25 LAB — CBC WITH DIFFERENTIAL/PLATELET
Absolute Monocytes: 842 cells/uL (ref 200–950)
Absolute Monocytes: 634 cells/uL (ref 200–950)
Basophils Absolute: 72 cells/uL (ref 0–200)
Basophils Absolute: 79 cells/uL (ref 0–200)
Basophils Relative: 1 %
Basophils Relative: 1.2 %
Eosinophils Absolute: 389 cells/uL (ref 15–500)
Eosinophils Absolute: 310 cells/uL (ref 15–500)
Eosinophils Relative: 5.4 %
Eosinophils Relative: 4.7 %
HCT: 40 % (ref 35.0–45.0)
HCT: 40.2 % (ref 35.0–45.0)
Hemoglobin: 12.6 g/dL (ref 11.7–15.5)
Hemoglobin: 12.8 g/dL (ref 11.7–15.5)
Lymphs Abs: 662 cells/uL — ABNORMAL LOW (ref 850–3900)
Lymphs Abs: 495 cells/uL — ABNORMAL LOW (ref 850–3900)
MCH: 29.4 pg (ref 27.0–33.0)
MCH: 29.7 pg (ref 27.0–33.0)
MCHC: 31.5 g/dL — ABNORMAL LOW (ref 32.0–36.0)
MCHC: 31.8 g/dL — ABNORMAL LOW (ref 32.0–36.0)
MCV: 93.5 fL (ref 80.0–100.0)
MCV: 93.3 fL (ref 80.0–100.0)
MPV: 12.4 fL (ref 7.5–12.5)
MPV: 13.6 fL — ABNORMAL HIGH (ref 7.5–12.5)
Monocytes Relative: 11.7 %
Monocytes Relative: 9.6 %
Neutro Abs: 5234 cells/uL (ref 1500–7800)
Neutro Abs: 5082 cells/uL (ref 1500–7800)
Neutrophils Relative %: 72.7 %
Neutrophils Relative %: 77 %
Platelets: 161 10*3/uL (ref 140–400)
RBC: 4.28 10*6/uL (ref 3.80–5.10)
RBC: 4.31 10*6/uL (ref 3.80–5.10)
RDW: 14.5 % (ref 11.0–15.0)
RDW: 14.3 % (ref 11.0–15.0)
Total Lymphocyte: 9.2 %
Total Lymphocyte: 7.5 %
WBC: 7.2 10*3/uL (ref 3.8–10.8)
WBC: 6.6 10*3/uL (ref 3.8–10.8)

## 2021-12-25 LAB — PRO B NATRIURETIC PEPTIDE: NT-Pro BNP: 2938 pg/mL — ABNORMAL HIGH (ref 0–738)

## 2021-12-25 LAB — LIPID PANEL
Cholesterol: 123 mg/dL (ref <200)
HDL: 40 mg/dL — ABNORMAL LOW (ref >=50)
LDL Cholesterol (Calc): 67 mg/dL (calc)
Non-HDL Cholesterol (Calc): 83 mg/dL (calc) (ref <130)
Total CHOL/HDL Ratio: 3.1 (calc) (ref <5.0)
Triglycerides: 79 mg/dL (ref <150)

## 2021-12-25 LAB — VITAMIN B12: Vitamin B-12: >2000 pg/mL — ABNORMAL HIGH (ref 200–1100)

## 2021-12-25 LAB — TEST AUTHORIZATION

## 2021-12-25 LAB — T4: T4, Total: 6.6 ug/dL (ref 5.1–11.9)

## 2021-12-25 LAB — T3: T3, Total: 98 ng/dL (ref 76–181)

## 2021-12-25 LAB — TSH: TSH: 6.12 mIU/L — ABNORMAL HIGH (ref 0.40–4.50)

## 2021-12-25 NOTE — Progress Notes (Signed)
Patient presents to office for nurse visit -c/o increased weight and SOB  Reds completed as requested per Dr Harrington Challenger   ReDS Vest / Clip - 12/25/21 1500       ReDS Vest / Clip   Station Marker A    Ruler Value 28    ReDS Value Range Low volume    ReDS Actual Value 29

## 2021-12-26 ENCOUNTER — Telehealth: Payer: Self-pay | Admitting: Family

## 2021-12-26 ENCOUNTER — Other Ambulatory Visit: Payer: Self-pay

## 2021-12-26 DIAGNOSIS — R1312 Dysphagia, oropharyngeal phase: Secondary | ICD-10-CM

## 2021-12-26 DIAGNOSIS — R7989 Other specified abnormal findings of blood chemistry: Secondary | ICD-10-CM

## 2021-12-26 NOTE — Telephone Encounter (Signed)
[name] son [name] is f/u on phone note from yesterday.  [name] can be reached at [phone]

## 2021-12-26 NOTE — Telephone Encounter (Signed)
Modified barium swallow study ordered.

## 2021-12-26 NOTE — Telephone Encounter (Signed)
Per Outpt Rehab, pt needs to have an Modified swallow test done before speech evaluation can be completed.  This needs to be placed as an order  [id number]  Choose location: [place] will call pt to schedule  [phone]  Thanks, Vilinda Blanks

## 2021-12-26 NOTE — Telephone Encounter (Signed)
Called patient's son, [name], ([name]) about his message. [name] stated that Dr. Harrington Challenger was going to check patient's lab work from her PCP's office and get back with him. Informed [name] that a message would be sent to Dr. Harrington Challenger. [name] stated that his mother is struggling to breathe. Informed [name] if his mother is struggling to breathe she needs to go to the ED. [name] stated he would check back with his mother and see how she is doing.

## 2021-12-27 ENCOUNTER — Telehealth (HOSPITAL_COMMUNITY): Payer: Self-pay

## 2021-12-27 NOTE — Telephone Encounter (Signed)
Called and spoke with son to schedule patient for OP MBS - son will call back when he is not driving.

## 2021-12-27 NOTE — Addendum Note (Signed)
Addended by: Loren Racer on: 12/27/2021 09:19 AM   Modules accepted: Orders

## 2021-12-27 NOTE — Telephone Encounter (Signed)
I spoke to patients son on Wed   He told me about his mothers continued SOB ReDs vest reading for volume looked OK COncern:   1.  Could reading be off/erroneous 2  Could she have critical CAD  WOuld recomm R and L heart cath to determine pressures and see coronary anatomy   NO LV gram. CBC (including platelets) are OK Cr still up 1.73  (was 1.2 earlier this year)  Patient needs Repeat BMET Tuesday to see if going to baseline  If stays at 1.7 places procedure at some increased risk to kidneys    Can schedule after

## 2021-12-27 NOTE — Telephone Encounter (Signed)
Spoke with son and he will bring pt for labs on Tuesday.  Advised him to discuss with pt about possible dates for heart cath.  Made him aware that she would need to hold her Xarelto for 2 days prior to cath so the earliest we could do it would likely be 01/03/22.  Son will discuss with pt and have that answer when we call about the lab work.

## 2021-12-30 ENCOUNTER — Other Ambulatory Visit (HOSPITAL_COMMUNITY): Payer: Self-pay | Admitting: *Deleted

## 2021-12-30 DIAGNOSIS — R1310 Dysphagia, unspecified: Secondary | ICD-10-CM

## 2021-12-31 ENCOUNTER — Other Ambulatory Visit: Payer: Self-pay

## 2021-12-31 ENCOUNTER — Telehealth: Payer: Self-pay | Admitting: Internal Medicine

## 2021-12-31 ENCOUNTER — Other Ambulatory Visit: Payer: Medicare Other | Admitting: *Deleted

## 2021-12-31 DIAGNOSIS — I5032 Chronic diastolic (congestive) heart failure: Secondary | ICD-10-CM

## 2021-12-31 LAB — BASIC METABOLIC PANEL
BUN/Creatinine Ratio: 24 (ref 12–28)
BUN: 35 mg/dL — ABNORMAL HIGH (ref 8–27)
CO2: 18 mmol/L — ABNORMAL LOW (ref 20–29)
Calcium: 9.3 mg/dL (ref 8.7–10.3)
Chloride: 105 mmol/L (ref 96–106)
Creatinine, Ser: 1.47 mg/dL — ABNORMAL HIGH (ref 0.57–1.00)
Glucose: 101 mg/dL — ABNORMAL HIGH (ref 70–99)
Potassium: 5 mmol/L (ref 3.5–5.2)
Sodium: 138 mmol/L (ref 134–144)
eGFR: 34 mL/min/{1.73_m2} — ABNORMAL LOW (ref >59)

## 2021-12-31 NOTE — Telephone Encounter (Signed)
Pt c/o swelling: STAT is pt has developed SOB within 24 hours  If swelling, where is the swelling located? Feet, stomach, legs, face  How much weight have you gained and in what time span?   Have you gained 3 pounds in a day or 5 pounds in a week? Not sure. Son is not with the patient   Do you have a log of your daily weights (if so, list)? Patient has it written down   Are you currently taking a fluid pill? no  Are you currently SOB? With any activity  Have you traveled recently?  No  [name] Dyer, son of patient called to report patient's symptoms. He is getting ready to come get her for her labs

## 2021-12-31 NOTE — Telephone Encounter (Signed)
Spoke with the patient's on who reports that the patient has continued to have swelling and SOB. He states that he talked to her this morning and she seemed to feeling a little bit better. He states that she has been elevating her legs and avoiding sodium. Per previous phone note Dr. Harrington Challenger would like to have patient repeat lab work and based on those results possibly set up for a heart cath. The son is bringing her into for the lab work. Advised that we would address once results are reviewed by Dr. Harrington Challenger. He verbalized understanding.

## 2022-01-01 ENCOUNTER — Encounter: Payer: Self-pay | Admitting: Internal Medicine

## 2022-01-01 ENCOUNTER — Telehealth: Payer: Self-pay | Admitting: Interventional Cardiology

## 2022-01-01 NOTE — Telephone Encounter (Signed)
[name] son is calling regarding 01/02/22 Catheterization. He states the patient has trouble getting up early in the morning and he would like to know whether it is possible to move the time out to a time in the afternoon or evening. He would also like to know if it will be alright for her to stay overnight at the hospital for observation. Please advise.

## 2022-01-01 NOTE — Telephone Encounter (Signed)
This is a [name] Dyer pt and she suggested the heart cath.

## 2022-01-01 NOTE — Telephone Encounter (Addendum)
Spoke with the pts son, [name], and he agreed to a R and L heart cath for next Wednesday 01/08/22 at 10:30 am with Dr. Ellyn Hack.... pt to arrive at 5:30 am for hydration.   She will hold her Xarelto starting 01/06/22 and 01/07/22 and the only thing she will take on 01/08/22 is ASA 81 mg.   Pts son verbalized instructions and will call if he has any further questions.

## 2022-01-01 NOTE — Telephone Encounter (Signed)
Error

## 2022-01-01 NOTE — Telephone Encounter (Signed)
Spoke with the pt son.. see previous encounter.

## 2022-01-06 ENCOUNTER — Encounter: Payer: Self-pay | Admitting: Internal Medicine

## 2022-01-06 ENCOUNTER — Telehealth: Payer: Self-pay | Admitting: Internal Medicine

## 2022-01-06 NOTE — Telephone Encounter (Signed)
[name] son calling for more information on the patient's cath procedure. He states he is not sure if she will be staying over night. Phone [phone]

## 2022-01-06 NOTE — Telephone Encounter (Signed)
Spoke with the patient's son and answered his questions in regards to the patient's heart cath procedure. He verbalized understanding.

## 2022-01-07 ENCOUNTER — Telehealth: Payer: Self-pay | Admitting: *Deleted

## 2022-01-07 ENCOUNTER — Encounter: Payer: Self-pay | Admitting: Podiatry

## 2022-01-07 ENCOUNTER — Other Ambulatory Visit: Payer: Self-pay

## 2022-01-07 ENCOUNTER — Telehealth: Payer: Self-pay | Admitting: Internal Medicine

## 2022-01-07 ENCOUNTER — Ambulatory Visit (INDEPENDENT_AMBULATORY_CARE_PROVIDER_SITE_OTHER): Payer: Medicare Other | Admitting: Podiatry

## 2022-01-07 DIAGNOSIS — M79676 Pain in unspecified toe(s): Secondary | ICD-10-CM | POA: Diagnosis not present

## 2022-01-07 DIAGNOSIS — G629 Polyneuropathy, unspecified: Secondary | ICD-10-CM

## 2022-01-07 DIAGNOSIS — B351 Tinea unguium: Secondary | ICD-10-CM

## 2022-01-07 NOTE — Telephone Encounter (Addendum)
Cardiac catheterization scheduled at [facility] for: Wednesday January 08, 2022 [facility] Main Entrance A [facility]) at: 5:30 AM-pre-procedure hydration   Diet-no solid food after midnight prior to cath, clear liquids until 5 AM day of procedure.  Medication instructions for procedure: -Hold  Xarelto-none 01/06/22 until post procedure. -Except hold medication usual morning medications can be taken pre-cath with sips of water including aspirin 81 mg.    Confirmed patient has responsible adult to drive home post procedure and be with patient first 24 hours after arriving home.  [facility] does allow one visitor to accompany you and wait in the hospital waiting room while you are there for your procedure. You and your visitor will be asked to wear a mask once you enter the hospital.   Patient reports does not currently have any new symptoms concerning for COVID-19 and no household members with COVID-19 like illness.    Reviewed procedure/mask/visitor instructions with patient's son (DPR), [name].

## 2022-01-07 NOTE — Telephone Encounter (Signed)
[name] Dyer is an 88 yo who I have followed in clinic for years.    This past year she has developed increased SOB with exertion and swelling     I treated medically for diastolic CHF    She was seen recently by [name]   He felt that pt was relatively volume depleted, stopped diuretics  The pt has continued to have symptoms of SOB with activities around house   Also complains of LE edema ReDs Vest reading within normal range  I have spoken to patient and her son   She has had afib for awhile   I am not convinced this is cause    Predates symtpoms    One question that hs not been answered is if symptoms are angina    Recomm R and L heart cath to define anatomy and pressures directly    Esp since she is not improving      Risks benefits explained.   Patient understands and agrees to proceed.    Procedure is set or 01/08/22  [name] MD

## 2022-01-07 NOTE — Progress Notes (Addendum)
This patient returns to my office for at risk foot care.  This patient requires this care by a professional since this patient will be at risk due to having neuropathy.  This patient is unable to cut nails herself since the patient cannot reach her nails.These nails are painful walking and wearing shoes.  This patient presents for at risk foot care today.  General Appearance  Alert, conversant and in no acute stress.  Vascular  Dorsalis pedis and posterior tibial  pulses are palpable  bilaterally.  Capillary return is within normal limits  bilaterally. Temperature is within normal limits  bilaterally.  Neurologic  Senn-Weinstein monofilament wire test absent bilaterally. Muscle power within normal limits bilaterally.  Nails Thick disfigured discolored nails with subungual debris  from hallux to fifth toes bilaterally. No evidence of bacterial infection or drainage bilaterally.  Orthopedic  No limitations of motion  feet .  No crepitus or effusions noted.  No bony pathology or digital deformities noted.  Skin  normotropic skin with no porokeratosis noted bilaterally.  No signs of infections or ulcers noted.     Onychomycosis  Pain in right toes  Pain in left toes  Consent was obtained for treatment procedures.   Mechanical debridement of nails 1-5  bilaterally performed with a nail nipper.  Filed with dremel without incident.    Return office visit      4 months                Told patient to return for periodic foot care and evaluation due to potential at risk complications.   [name] DPM

## 2022-01-07 NOTE — Telephone Encounter (Signed)
Entry error.

## 2022-01-07 NOTE — Telephone Encounter (Signed)
Error

## 2022-01-08 ENCOUNTER — Encounter (HOSPITAL_COMMUNITY): Payer: Self-pay | Admitting: Cardiology

## 2022-01-08 ENCOUNTER — Other Ambulatory Visit: Payer: Self-pay

## 2022-01-08 ENCOUNTER — Other Ambulatory Visit: Payer: Self-pay | Admitting: Internal Medicine

## 2022-01-08 ENCOUNTER — Encounter (HOSPITAL_COMMUNITY)
Admission: RE | Disposition: A | Discharge status: Home Health | Payer: Medicare Other | Source: Ambulatory Visit | Attending: Internal Medicine

## 2022-01-08 ENCOUNTER — Inpatient Hospital Stay (HOSPITAL_COMMUNITY)
Admission: RE | Admit: 2022-01-08 | Discharge: 2022-01-17 | DRG: 286 | Disposition: A | Discharge status: Home Health | Payer: Medicare Other | Source: Ambulatory Visit | Attending: Internal Medicine | Admitting: Internal Medicine

## 2022-01-08 DIAGNOSIS — I272 Pulmonary hypertension, unspecified: Secondary | ICD-10-CM | POA: Diagnosis not present

## 2022-01-08 DIAGNOSIS — I10 Essential (primary) hypertension: Secondary | ICD-10-CM | POA: Diagnosis present

## 2022-01-08 DIAGNOSIS — Z8249 Family history of ischemic heart disease and other diseases of the circulatory system: Secondary | ICD-10-CM

## 2022-01-08 DIAGNOSIS — Z7983 Long term (current) use of bisphosphonates: Secondary | ICD-10-CM | POA: Diagnosis not present

## 2022-01-08 DIAGNOSIS — M109 Gout, unspecified: Secondary | ICD-10-CM | POA: Diagnosis present

## 2022-01-08 DIAGNOSIS — Z79899 Other long term (current) drug therapy: Secondary | ICD-10-CM | POA: Diagnosis not present

## 2022-01-08 DIAGNOSIS — I5043 Acute on chronic combined systolic (congestive) and diastolic (congestive) heart failure: Secondary | ICD-10-CM | POA: Diagnosis not present

## 2022-01-08 DIAGNOSIS — Z882 Allergy status to sulfonamides status: Secondary | ICD-10-CM | POA: Diagnosis not present

## 2022-01-08 DIAGNOSIS — M810 Age-related osteoporosis without current pathological fracture: Secondary | ICD-10-CM | POA: Diagnosis present

## 2022-01-08 DIAGNOSIS — R0609 Other forms of dyspnea: Secondary | ICD-10-CM | POA: Diagnosis present

## 2022-01-08 DIAGNOSIS — Z87440 Personal history of urinary (tract) infections: Secondary | ICD-10-CM

## 2022-01-08 DIAGNOSIS — I2723 Pulmonary hypertension due to lung diseases and hypoxia: Secondary | ICD-10-CM | POA: Diagnosis present

## 2022-01-08 DIAGNOSIS — N183 Chronic kidney disease, stage 3 unspecified: Secondary | ICD-10-CM | POA: Diagnosis present

## 2022-01-08 DIAGNOSIS — I5033 Acute on chronic diastolic (congestive) heart failure: Secondary | ICD-10-CM | POA: Diagnosis present

## 2022-01-08 DIAGNOSIS — I4821 Permanent atrial fibrillation: Secondary | ICD-10-CM | POA: Diagnosis present

## 2022-01-08 DIAGNOSIS — I208 Other forms of angina pectoris: Secondary | ICD-10-CM | POA: Clinically undetermined

## 2022-01-08 DIAGNOSIS — Z888 Allergy status to other drugs, medicaments and biological substances status: Secondary | ICD-10-CM

## 2022-01-08 DIAGNOSIS — Z8701 Personal history of pneumonia (recurrent): Secondary | ICD-10-CM | POA: Diagnosis not present

## 2022-01-08 DIAGNOSIS — I5032 Chronic diastolic (congestive) heart failure: Secondary | ICD-10-CM | POA: Diagnosis present

## 2022-01-08 DIAGNOSIS — I130 Hypertensive heart and chronic kidney disease with heart failure and stage 1 through stage 4 chronic kidney disease, or unspecified chronic kidney disease: Principal | ICD-10-CM | POA: Diagnosis present

## 2022-01-08 DIAGNOSIS — I471 Supraventricular tachycardia: Secondary | ICD-10-CM | POA: Diagnosis present

## 2022-01-08 DIAGNOSIS — G309 Alzheimer's disease, unspecified: Secondary | ICD-10-CM | POA: Diagnosis present

## 2022-01-08 DIAGNOSIS — Z881 Allergy status to other antibiotic agents status: Secondary | ICD-10-CM | POA: Diagnosis not present

## 2022-01-08 DIAGNOSIS — Z79890 Hormone replacement therapy (postmenopausal): Secondary | ICD-10-CM

## 2022-01-08 DIAGNOSIS — I25118 Atherosclerotic heart disease of native coronary artery with other forms of angina pectoris: Secondary | ICD-10-CM | POA: Diagnosis present

## 2022-01-08 DIAGNOSIS — R0602 Shortness of breath: Secondary | ICD-10-CM

## 2022-01-08 DIAGNOSIS — Z90710 Acquired absence of both cervix and uterus: Secondary | ICD-10-CM | POA: Diagnosis not present

## 2022-01-08 DIAGNOSIS — Z7901 Long term (current) use of anticoagulants: Secondary | ICD-10-CM | POA: Diagnosis not present

## 2022-01-08 DIAGNOSIS — R609 Edema, unspecified: Secondary | ICD-10-CM

## 2022-01-08 DIAGNOSIS — Z820 Family history of epilepsy and other diseases of the nervous system: Secondary | ICD-10-CM

## 2022-01-08 DIAGNOSIS — E1122 Type 2 diabetes mellitus with diabetic chronic kidney disease: Secondary | ICD-10-CM | POA: Diagnosis present

## 2022-01-08 DIAGNOSIS — R413 Other amnesia: Secondary | ICD-10-CM | POA: Diagnosis present

## 2022-01-08 DIAGNOSIS — F0280 Dementia in other diseases classified elsewhere without behavioral disturbance: Secondary | ICD-10-CM | POA: Diagnosis present

## 2022-01-08 DIAGNOSIS — I2510 Atherosclerotic heart disease of native coronary artery without angina pectoris: Secondary | ICD-10-CM | POA: Diagnosis not present

## 2022-01-08 DIAGNOSIS — E785 Hyperlipidemia, unspecified: Secondary | ICD-10-CM | POA: Diagnosis present

## 2022-01-08 HISTORY — PX: RIGHT/LEFT HEART CATH AND CORONARY ANGIOGRAPHY: CATH118266

## 2022-01-08 SURGERY — RIGHT/LEFT HEART CATH AND CORONARY ANGIOGRAPHY
Anesthesia: LOCAL

## 2022-01-08 MED ORDER — SODIUM CHLORIDE 0.9 % IV SOLN: 250.0000 mL | INTRAVENOUS | Status: DC | PRN

## 2022-01-08 MED ORDER — SODIUM CHLORIDE 0.9 % IV SOLN: INTRAVENOUS | Status: AC

## 2022-01-08 MED ORDER — VERAPAMIL HCL 2.5 MG/ML IV SOLN
INTRAVENOUS | Status: AC
  Filled 2022-01-08: qty 2

## 2022-01-08 MED ORDER — HEPARIN (PORCINE) IN NACL 1000-0.9 UT/500ML-% IV SOLN
INTRAVENOUS | Status: AC
  Filled 2022-01-08: qty 500

## 2022-01-08 MED ORDER — SODIUM CHLORIDE 0.9 % WEIGHT BASED INFUSION
1.0000 mL/kg/h | INTRAVENOUS | Status: DC
  Administered 2022-01-08: 1 mL/kg/h via INTRAVENOUS

## 2022-01-08 MED ORDER — MEMANTINE HCL 5 MG PO TABS
5.0000 mg | ORAL_TABLET | Freq: Two times a day (BID) | ORAL | Status: DC
  Administered 2022-01-08 – 2022-01-17 (×18): 5 mg via ORAL
  Filled 2022-01-08 (×19): qty 1

## 2022-01-08 MED ORDER — FENTANYL CITRATE (PF) 100 MCG/2ML IJ SOLN
INTRAMUSCULAR | Status: AC
  Filled 2022-01-08: qty 2

## 2022-01-08 MED ORDER — VITAMIN B-12 1000 MCG PO TABS
1000.0000 ug | ORAL_TABLET | Freq: Every day | ORAL | Status: DC
  Administered 2022-01-09 – 2022-01-17 (×9): 1000 ug via ORAL
  Filled 2022-01-08 (×9): qty 1

## 2022-01-08 MED ORDER — LIDOCAINE HCL (PF) 1 % IJ SOLN
INTRAMUSCULAR | Status: AC
  Filled 2022-01-08: qty 30

## 2022-01-08 MED ORDER — HYDRALAZINE HCL 20 MG/ML IJ SOLN: 10.0000 mg | INTRAMUSCULAR | Status: AC | PRN

## 2022-01-08 MED ORDER — LIDOCAINE HCL (PF) 1 % IJ SOLN
INTRAMUSCULAR | Status: DC | PRN
  Administered 2022-01-08 (×3): 2 mL

## 2022-01-08 MED ORDER — TRAZODONE HCL 50 MG PO TABS
50.0000 mg | ORAL_TABLET | Freq: Every evening | ORAL | Status: DC | PRN
  Administered 2022-01-09: 50 mg via ORAL
  Filled 2022-01-08 (×2): qty 1

## 2022-01-08 MED ORDER — ALPHA-LIPOIC ACID 200 MG PO TABS: 200.0000 mg | ORAL_TABLET | Freq: Three times a day (TID) | ORAL | Status: DC

## 2022-01-08 MED ORDER — ONDANSETRON HCL 4 MG/2ML IJ SOLN: 4.0000 mg | Freq: Four times a day (QID) | INTRAMUSCULAR | Status: DC | PRN

## 2022-01-08 MED ORDER — CALCIUM CARBONATE 600 MG PO TABS: 600.0000 mg | ORAL_TABLET | Freq: Every morning | ORAL | Status: DC

## 2022-01-08 MED ORDER — IPRATROPIUM BROMIDE 0.06 % NA SOLN
2.0000 | Freq: Four times a day (QID) | NASAL | Status: DC | PRN
  Filled 2022-01-08: qty 15

## 2022-01-08 MED ORDER — POTASSIUM CHLORIDE ER 10 MEQ PO TBCR
20.0000 meq | EXTENDED_RELEASE_TABLET | Freq: Once | ORAL | Status: AC
  Administered 2022-01-08: 20 meq via ORAL
  Filled 2022-01-08 (×2): qty 2

## 2022-01-08 MED ORDER — ASPIRIN 81 MG PO CHEW: 81.0000 mg | CHEWABLE_TABLET | ORAL | Status: DC

## 2022-01-08 MED ORDER — CALCIUM CARBONATE 1250 (500 CA) MG PO TABS
1250.0000 mg | ORAL_TABLET | Freq: Every morning | ORAL | Status: DC
  Administered 2022-01-09 – 2022-01-17 (×9): 1250 mg via ORAL
  Filled 2022-01-08 (×8): qty 1

## 2022-01-08 MED ORDER — LABETALOL HCL 5 MG/ML IV SOLN: 10.0000 mg | INTRAVENOUS | Status: AC | PRN

## 2022-01-08 MED ORDER — SODIUM CHLORIDE 0.9% FLUSH
3.0000 mL | INTRAVENOUS | Status: DC | PRN
  Administered 2022-01-16: 3 mL via INTRAVENOUS

## 2022-01-08 MED ORDER — MELATONIN 5 MG PO TABS
10.0000 mg | ORAL_TABLET | Freq: Every day | ORAL | Status: DC
  Administered 2022-01-08 – 2022-01-16 (×9): 10 mg via ORAL
  Filled 2022-01-08 (×9): qty 2

## 2022-01-08 MED ORDER — ACETAMINOPHEN 500 MG PO TABS: 1000.0000 mg | ORAL_TABLET | Freq: Four times a day (QID) | ORAL | Status: DC | PRN

## 2022-01-08 MED ORDER — MIDAZOLAM HCL 2 MG/2ML IJ SOLN
INTRAMUSCULAR | Status: DC | PRN
  Administered 2022-01-08: 1 mg via INTRAVENOUS

## 2022-01-08 MED ORDER — FUROSEMIDE 10 MG/ML IJ SOLN
40.0000 mg | Freq: Two times a day (BID) | INTRAMUSCULAR | Status: DC
  Administered 2022-01-08 – 2022-01-09 (×2): 40 mg via INTRAVENOUS
  Filled 2022-01-08 (×2): qty 4

## 2022-01-08 MED ORDER — DONEPEZIL HCL 10 MG PO TABS
10.0000 mg | ORAL_TABLET | Freq: Every day | ORAL | Status: DC
  Administered 2022-01-08 – 2022-01-16 (×9): 10 mg via ORAL
  Filled 2022-01-08 (×9): qty 1

## 2022-01-08 MED ORDER — MUSCLE RUB 10-15 % EX CREA
TOPICAL_CREAM | CUTANEOUS | Status: DC | PRN
  Filled 2022-01-08: qty 85

## 2022-01-08 MED ORDER — MIDAZOLAM HCL 2 MG/2ML IJ SOLN
INTRAMUSCULAR | Status: AC
  Filled 2022-01-08: qty 2

## 2022-01-08 MED ORDER — HEPARIN SODIUM (PORCINE) 1000 UNIT/ML IJ SOLN
INTRAMUSCULAR | Status: DC | PRN
  Administered 2022-01-08: 3000 [IU] via INTRAVENOUS

## 2022-01-08 MED ORDER — SODIUM CHLORIDE 0.9% FLUSH
3.0000 mL | Freq: Two times a day (BID) | INTRAVENOUS | Status: DC
  Administered 2022-01-09 – 2022-01-17 (×15): 3 mL via INTRAVENOUS

## 2022-01-08 MED ORDER — GABAPENTIN 300 MG PO CAPS
300.0000 mg | ORAL_CAPSULE | Freq: Three times a day (TID) | ORAL | Status: DC
  Administered 2022-01-08 – 2022-01-17 (×28): 300 mg via ORAL
  Filled 2022-01-08 (×28): qty 1

## 2022-01-08 MED ORDER — VERAPAMIL HCL 2.5 MG/ML IV SOLN
INTRAVENOUS | Status: DC | PRN
  Administered 2022-01-08 (×2): 10 mL via INTRA_ARTERIAL

## 2022-01-08 MED ORDER — MENTHOL (TOPICAL ANALGESIC) 10 % EX LIQD: Freq: Every day | CUTANEOUS | Status: DC | PRN

## 2022-01-08 MED ORDER — ATENOLOL 25 MG PO TABS
75.0000 mg | ORAL_TABLET | Freq: Two times a day (BID) | ORAL | Status: DC
  Administered 2022-01-08 – 2022-01-09 (×2): 75 mg via ORAL
  Filled 2022-01-08 (×2): qty 3

## 2022-01-08 MED ORDER — SIMVASTATIN 20 MG PO TABS
10.0000 mg | ORAL_TABLET | Freq: Every day | ORAL | Status: DC
  Administered 2022-01-08 – 2022-01-17 (×10): 10 mg via ORAL
  Filled 2022-01-08 (×10): qty 1

## 2022-01-08 MED ORDER — FENTANYL CITRATE (PF) 100 MCG/2ML IJ SOLN
INTRAMUSCULAR | Status: DC | PRN
  Administered 2022-01-08: 25 ug via INTRAVENOUS

## 2022-01-08 MED ORDER — SODIUM CHLORIDE 0.9 % WEIGHT BASED INFUSION: 3.0000 mL/kg/h | INTRAVENOUS | Status: DC

## 2022-01-08 MED ORDER — ACETAMINOPHEN 325 MG PO TABS: 650.0000 mg | ORAL_TABLET | ORAL | Status: DC | PRN

## 2022-01-08 MED ORDER — RIVAROXABAN 15 MG PO TABS
15.0000 mg | ORAL_TABLET | Freq: Every day | ORAL | Status: DC
  Administered 2022-01-08 – 2022-01-17 (×10): 15 mg via ORAL
  Filled 2022-01-08 (×10): qty 1

## 2022-01-08 MED ORDER — ASCORBIC ACID 500 MG PO TABS
250.0000 mg | ORAL_TABLET | Freq: Every day | ORAL | Status: DC
  Administered 2022-01-09 – 2022-01-17 (×9): 250 mg via ORAL
  Filled 2022-01-08 (×9): qty 1

## 2022-01-08 MED ORDER — SODIUM CHLORIDE 0.9% FLUSH: 3.0000 mL | INTRAVENOUS | Status: DC | PRN

## 2022-01-08 MED ORDER — HEPARIN (PORCINE) IN NACL 1000-0.9 UT/500ML-% IV SOLN
INTRAVENOUS | Status: DC | PRN
  Administered 2022-01-08 (×2): 500 mL

## 2022-01-08 MED ORDER — SODIUM CHLORIDE 0.9% FLUSH: 3.0000 mL | Freq: Two times a day (BID) | INTRAVENOUS | Status: DC

## 2022-01-08 MED ORDER — IOHEXOL 350 MG/ML SOLN
INTRAVENOUS | Status: DC | PRN
  Administered 2022-01-08: 60 mL

## 2022-01-08 SURGICAL SUPPLY — 15 items
CATH BALLN WEDGE 5F 110CM (CATHETERS) ×1 IMPLANT
CATH OPTITORQUE TIG 4.0 5F (CATHETERS) ×1 IMPLANT
DEVICE RAD COMP TR BAND LRG (VASCULAR PRODUCTS) ×1 IMPLANT
GLIDESHEATH SLEND SS 6F .021 (SHEATH) ×1 IMPLANT
GUIDEWIRE .025 260CM (WIRE) ×1 IMPLANT
GUIDEWIRE INQWIRE 1.5J.035X260 (WIRE) IMPLANT
INQWIRE 1.5J .035X260CM (WIRE) ×2
KIT HEART LEFT (KITS) ×2 IMPLANT
PACK CARDIAC CATHETERIZATION (CUSTOM PROCEDURE TRAY) ×2 IMPLANT
SHEATH GLIDE SLENDER 4/5FR (SHEATH) ×1 IMPLANT
SHEATH PROBE COVER 6X72 (BAG) ×1 IMPLANT
SYR MEDRAD MARK 7 150ML (SYRINGE) ×2 IMPLANT
TRANSDUCER W/STOPCOCK (MISCELLANEOUS) ×2 IMPLANT
TUBING CIL FLEX 10 FLL-RA (TUBING) ×2 IMPLANT
WIRE MICROINTRODUCER 60CM (WIRE) ×1 IMPLANT

## 2022-01-08 NOTE — Brief Op Note (Signed)
BRIEF CARDIAC CATHETERIZATION REPORT  01/08/2022  2:23 PM  PROCEDURE:  Procedure(s): RIGHT/LEFT HEART CATH AND CORONARY ANGIOGRAPHY (N/A)  SURGEON:  Surgeon(s) and Role:    * Leonie Man, MD - Primary  PATIENT:  [name] Dyer  88 y.o. female followed for years by Dr. Harrington Challenger.  In a telephone with Dr. Harrington Challenger, notes:  Ms Randleman is an 88 yo who I have followed in clinic for years.    This past year she has developed increased SOB with exertion and swelling     I treated medically for diastolic CHF    She was seen recently by [name]   He felt that pt was relatively volume depleted, stopped diuretics   The pt has continued to have symptoms of SOB with activities around house   Also complains of LE edema ReDs Vest reading within normal range   I have spoken to patient and her son   She has had afib for awhile   I am not convinced this is cause    Predates symtpoms    One question that hs not been answered is if symptoms are angina    Recomm R and L heart cath to define anatomy and pressures directly    Esp since she is not improving       Risks benefits explained.   Patient understands and agrees to proceed.    Procedure is set or 01/08/22    PRE-OPERATIVE DIAGNOSIS:  shortness of breath, acute on chronic diastolic heart failure   PROCDURE Time Out: Verified patient identification, verified procedure, site/side was marked, verified correct patient position, special equipment/implants available, medications/allergies/relevent history reviewed, required imaging and test results available. Performed.  Access:  RIGHT Radial Artery: 6 Fr sheath -- Seldinger technique using Micropuncture Kit Direct ultrasound guidance used.  Permanent image obtained and placed on chart. 10 mL radial cocktail IA; 3500 Units IV Heparin * Right Brachial/Antecubital Vein: The existing 18-gauge IV was exchanged over a wire for a 5Fr  sheath  Right Heart Catheterization: 5 Fr Gordy Councilman catheter  advanced under fluoroscopy with balloon inflated to the RA, RV, then PCWP-PA for hemodynamic measurement.  * Simultaneous FA & PA blood gases checked for SaO2% to calculate FICK CO/CI  * Catheter removed completely out of the body with balloon deflated.  Left Heart Catheterization: 5 Fr Catheters advanced or exchanged over a J-wire under direct fluoroscopic guidance into the ascending aorta; TIG 4.0 catheter advanced first.  * LV Hemodynamics (LV Gram): TIG 4.0 Catheter * Left & Right Coronary Artery Cineangiography: TIG 4.0 Catheter  Upon completion of Angiogaphy, the catheter was removed completely out of the body over a wire, without complication.  Brachial Sheath(s) removed in the cath lab with manual pressure for hemostasis.    Radial sheath removed in the Cardiac Catheterization lab with TR Band placed for hemostasis.  MEDICATIONS * SQ Lidocaine 61m * Radial Cocktail: 3 mg Verapmil in 10 mL NS * Isovue Contrast: ~55-60 mL * Heparin: 3500 Units  POST-OPERATIVE DIAGNOSIS:   Moderate two-vessel CAD with 50 to 60% mid LAD disease and 45 to 50% proximal RCA disease otherwise minimal disease in the rest of the coronary arteries Severe Combined Pulmonary Hypertension -mostly related to Acute on ChronicDiastolic Heart Failure, clearly with underlying pulmonary disease as well: PAP 68/30 mmHg with mean of 49 mmHg; PCWP is 29 to 30 mmHg,  LV P-EDP 120/16 mmHg - 26 mmHg. RAP 22 mmHg, RVP-EDP 72/15 mmHg - 26 mmHg;  Ao P8-MAP 132/66 mmHg - 94 mmHg Moderate his Cardiac Function: Cardiac Output-Index 3.69-2.25. Permanent atrial fibrillation   PHYSICIAN ASSISTANT:   ANESTHESIA:   local -> 25 mcg fentanyl  EBL:  <50 mL  BLOOD ADMINISTERED:none  DRAINS: none   MEDICATIONS USED: 55 to 60 mL contrast along with sedation  DICTATION: .Note written in [facility]: Admit to inpatient  -> have written for IV Lasix, however I suspect she may need inotropic assistance.  With setting of  atrial fibrillation, would probably recommend milrinone. Restart Xarelto tonight  PATIENT DISPOSITION:  PACU - hemodynamically stable.    Glenetta Hew, MD

## 2022-01-08 NOTE — Interval H&P Note (Signed)
History and Physical Interval Note:  12/27/2021: Telephone Encounter Update [name] Dyer is an 88 yo who I have followed in clinic for years.    This past year she has developed increased SOB with exertion and swelling     I treated medically for diastolic CHF    She was seen recently by [name]   He felt that pt was relatively volume depleted, stopped diuretics   The pt has continued to have symptoms of SOB with activities around house   Also complains of LE edema ReDs Vest reading within normal range   I have spoken to patient and her son   She has had afib for awhile   I am not convinced this is cause    Predates symtpoms    One question that hs not been answered is if symptoms are angina    Recomm R and L heart cath to define anatomy and pressures directly    Esp since she is not improving       Risks benefits explained.   Patient understands and agrees to proceed.    Procedure is set or 01/08/22   [name] MD   January 07, 2022 Telephone Encounter Update [name] Dyer is an 88 yo who I have followed in clinic for years.    This past year she has developed increased SOB with exertion and swelling     I treated medically for diastolic CHF    She was seen recently by [name]   He felt that pt was relatively volume depleted, stopped diuretics   The pt has continued to have symptoms of SOB with activities around house   Also complains of LE edema ReDs Vest reading within normal range   I have spoken to patient and her son   She has had afib for awhile   I am not convinced this is cause    Predates symtpoms    One question that hs not been answered is if symptoms are angina    Recomm R and L heart cath to define anatomy and pressures directly    Esp since she is not improving       Risks benefits explained.   Patient understands and agrees to proceed.    Procedure is set or 01/08/22   [name] MD     01/08/2022 12:23 PM  [name] Dyer  has presented today for surgery, with the  diagnosis of shortness of breath, chronic diastolic heart failure.  The various methods of treatment have been discussed with the patient and family. After consideration of risks, benefits and other options for treatment, the patient has consented to  Procedure(s): RIGHT/LEFT HEART CATH AND CORONARY ANGIOGRAPHY (N/A) as a surgical intervention.  The patient's history has been reviewed, patient examined, no change in status, stable for surgery.  I have reviewed the patient's chart and labs.  Questions were answered to the patient's satisfaction.    Cath Lab Visit (complete for each Cath Lab visit)  Clinical Evaluation Leading to the Procedure:   ACS: No.  Non-ACS:    Anginal Classification: CCS III  Anti-ischemic medical therapy: Maximal Therapy (2 or more classes of medications)  Non-Invasive Test Results: No non-invasive testing performed  Prior CABG: No previous CABG    Glenetta Hew

## 2022-01-08 NOTE — Telephone Encounter (Signed)
Prescription refill request for Xarelto received.  Hx: afib  Last office visit: Tamala Julian 12/16/2021 Weight: 60.3 kg  Age: 88 yo  Scr: 1.47, 12/31/2021 CrCl: 26 ml/min   Refill sent.

## 2022-01-08 NOTE — Progress Notes (Signed)
Looked at brachial cath site with cath lab RN Stacey at bedside due to patient having increased pain and some swelling. The bruising is minimal near cath site and unchanged from when the patient arrived on the unit. The upper arm is not hard or painful to touch. Erline Levine advised to keep an eye on it but did not see any major concerns at this time.   Passed off to night shift RN who assessed the site with me, she will continue to monitor. Advised patient to call for any bleeding or worsening pain/swelling.

## 2022-01-09 ENCOUNTER — Encounter (HOSPITAL_COMMUNITY): Payer: Medicare Other

## 2022-01-09 ENCOUNTER — Ambulatory Visit (HOSPITAL_COMMUNITY): Payer: Medicare Other

## 2022-01-09 ENCOUNTER — Inpatient Hospital Stay (HOSPITAL_COMMUNITY): Payer: Medicare Other

## 2022-01-09 ENCOUNTER — Encounter (HOSPITAL_COMMUNITY): Payer: Self-pay | Admitting: Cardiology

## 2022-01-09 DIAGNOSIS — I272 Pulmonary hypertension, unspecified: Secondary | ICD-10-CM

## 2022-01-09 DIAGNOSIS — I5043 Acute on chronic combined systolic (congestive) and diastolic (congestive) heart failure: Secondary | ICD-10-CM

## 2022-01-09 DIAGNOSIS — R0609 Other forms of dyspnea: Secondary | ICD-10-CM | POA: Diagnosis not present

## 2022-01-09 LAB — POCT I-STAT EG7
Acid-base deficit: 4 mmol/L — ABNORMAL HIGH (ref 0.0–2.0)
Acid-base deficit: 4 mmol/L — ABNORMAL HIGH (ref 0.0–2.0)
Bicarbonate: 22 mmol/L (ref 20.0–28.0)
Bicarbonate: 22.2 mmol/L (ref 20.0–28.0)
Calcium, Ion: 1.18 mmol/L (ref 1.15–1.40)
Calcium, Ion: 1.22 mmol/L (ref 1.15–1.40)
HCT: 38 % (ref 36.0–46.0)
HCT: 39 % (ref 36.0–46.0)
Hemoglobin: 12.9 g/dL (ref 12.0–15.0)
Hemoglobin: 13.3 g/dL (ref 12.0–15.0)
O2 Saturation: 65 %
O2 Saturation: 66 %
Potassium: 4.6 mmol/L (ref 3.5–5.1)
Potassium: 4.6 mmol/L (ref 3.5–5.1)
Sodium: 141 mmol/L (ref 135–145)
Sodium: 141 mmol/L (ref 135–145)
TCO2: 23 mmol/L (ref 22–32)
TCO2: 24 mmol/L (ref 22–32)
pCO2, Ven: 44.2 mmHg (ref 44.0–60.0)
pCO2, Ven: 44.8 mmHg (ref 44.0–60.0)
pH, Ven: 7.304 (ref 7.250–7.430)
pH, Ven: 7.305 (ref 7.250–7.430)
pO2, Ven: 37 mmHg (ref 32.0–45.0)
pO2, Ven: 38 mmHg (ref 32.0–45.0)

## 2022-01-09 LAB — COMPREHENSIVE METABOLIC PANEL
ALT: 21 U/L (ref 0–44)
AST: 26 U/L (ref 15–41)
Albumin: 3.1 g/dL — ABNORMAL LOW (ref 3.5–5.0)
Alkaline Phosphatase: 99 U/L (ref 38–126)
Anion gap: 8 (ref 5–15)
BUN: 37 mg/dL — ABNORMAL HIGH (ref 8–23)
CO2: 22 mmol/L (ref 22–32)
Calcium: 8.2 mg/dL — ABNORMAL LOW (ref 8.9–10.3)
Chloride: 108 mmol/L (ref 98–111)
Creatinine, Ser: 1.39 mg/dL — ABNORMAL HIGH (ref 0.44–1.00)
GFR, Estimated: 37 mL/min — ABNORMAL LOW (ref >60)
Glucose, Bld: 89 mg/dL (ref 70–99)
Potassium: 3.9 mmol/L (ref 3.5–5.1)
Sodium: 138 mmol/L (ref 135–145)
Total Bilirubin: 1.3 mg/dL — ABNORMAL HIGH (ref 0.3–1.2)
Total Protein: 5.6 g/dL — ABNORMAL LOW (ref 6.5–8.1)

## 2022-01-09 LAB — POCT I-STAT 7, (LYTES, BLD GAS, ICA,H+H)
Acid-base deficit: 4 mmol/L — ABNORMAL HIGH (ref 0.0–2.0)
Bicarbonate: 20.6 mmol/L (ref 20.0–28.0)
Calcium, Ion: 1.14 mmol/L — ABNORMAL LOW (ref 1.15–1.40)
HCT: 38 % (ref 36.0–46.0)
Hemoglobin: 12.9 g/dL (ref 12.0–15.0)
O2 Saturation: 99 %
Potassium: 4.4 mmol/L (ref 3.5–5.1)
Sodium: 141 mmol/L (ref 135–145)
TCO2: 22 mmol/L (ref 22–32)
pCO2 arterial: 36.4 mmHg (ref 32.0–48.0)
pH, Arterial: 7.362 (ref 7.350–7.450)
pO2, Arterial: 167 mmHg — ABNORMAL HIGH (ref 83.0–108.0)

## 2022-01-09 LAB — ECHOCARDIOGRAM COMPLETE
AR max vel: 1.49 cm2
AV Area VTI: 1.49 cm2
AV Area mean vel: 1.52 cm2
AV Mean grad: 2 mmHg
AV Peak grad: 3.1 mmHg
Ao pk vel: 0.88 m/s
Height: 64 in
S' Lateral: 2.8 cm
Weight: 2128 oz

## 2022-01-09 MED ORDER — FUROSEMIDE 10 MG/ML IJ SOLN
80.0000 mg | Freq: Two times a day (BID) | INTRAMUSCULAR | Status: DC
  Administered 2022-01-09 – 2022-01-11 (×5): 80 mg via INTRAVENOUS
  Filled 2022-01-09 (×5): qty 8

## 2022-01-09 MED ORDER — ATENOLOL 25 MG PO TABS
50.0000 mg | ORAL_TABLET | Freq: Two times a day (BID) | ORAL | Status: DC
  Administered 2022-01-09 – 2022-01-17 (×16): 50 mg via ORAL
  Filled 2022-01-09 (×16): qty 2

## 2022-01-09 NOTE — Progress Notes (Signed)
Transition of Care [facility]) Screening Note   Patient Details  Name: TUNISIA LANDGREBE Date of Birth: 24-Jul-1932   Transition of Care [facility]) CM/SW Contact:    Milas Gain, [place] Phone Number: 01/09/2022, 5:02 PM    Transition of Care Department [facility]) has reviewed patient and no TOC needs have been identified at this time. We will continue to monitor patient advancement through interdisciplinary progression rounds. If new patient transition needs arise, please place a TOC consult.

## 2022-01-09 NOTE — Progress Notes (Signed)
Echocardiogram 2D Echocardiogram has been performed.  [name] Dyer 01/09/2022, 5:06 PM

## 2022-01-09 NOTE — H&P (Addendum)
Cardiology Admission History and Physical:   Patient ID: [name] Dyer MRN: [id number]; DOB: Sep 06, 1932   Admission date: 01/08/2022  PCP:  [name], NP   [place] Prov[facility] Cardiologist:  [name], MD        Chief Complaint:  SOB possible angina  Delayed H&P due to computer glitch.  Patient Profile:   [name] Dyer is a 88 y.o. female with hx of atrial fib chronic diastolic CHF, MR, HTN, HL and admit 01/2020 with CHF who is being seen 01/09/2022 for the evaluation of CHF.  History of Present Illness:   [name] Dyer with hx as above, was seen by Dr. Tamala Julian 12/16/21 as urgent visit for SOB to access volume status.  Her torsemide dose have changed for lower ext edema and dyspnea.   She had DOE, +memory decreasing, and intermittent nosebleeds.  She can be flat at night to sleep and no dyspnea at rest.  Her Cr had been increasing.  Cr up to 1.95.  Her BNP was elevated.  With absence of orthopnea, and no major complaint of dyspnea.  Decided to discontinue empagliflozin and torsemide. She was also noted to have orthostatic drop in BP 135/60 to 120/55 with standing.   Her BNP on the 19th of Dec was 4,025 and on the 27th 2,938   her Cr remained the same off torsemide.  K+ was 5.3.   then on 12/31/21 Cr was 1.47 and K+ 5.0   Since the visit on the 19th while her labs improved her dyspnea increased.  She also developed lower ext edema.  Dr. Harrington Challenger was in contact with pt and family and there was concern for ischemia as cause of issue.  Therefore pt was scheduled for Rt and lt cardiac cath for further eval of cause of symptoms.   No prior cardiac cath.  Last echo 06/14/21  with EF 55-60%, no RWMA, RV normal, LA moderately dilated, along with RA.  Mild MR, mod TR, trivial AR  In 2019 she did have a lexiscan myoview that was normal.    EKG:  The ECG that was done 12/16/21 was personally reviewed and demonstrates atrial fib with RVR and IRBB but no changes  Pt presented for cath 01/08/22  found to have non obstructive CAD, see report below.  But she does have severe combined pulmonary hypertension, mostly related acute on chronic diastolic HF, with underlying pulmonary disease as well.  PAP 68/30 with mean of 49.  PCWP is 29 to 30 mmHg.  She was admitted for IV diuresis and may need to eval for milrinone inotropic support.   Her Xarelto was resumed.   Labs today Na 138 K+ 3.9, Cr 1.39 Alb 3.1 Total 5.6 GFR 37   Currently  BP 125/80 P 96 R 18 afebrile.  Sp02 RA 92% She feels better today than yesterday but still not does not feel well.  Her edema increased over a week about 10 lbs.  She could continue to walk but weak and tired.      Past Medical History:  Diagnosis Date   Alzheimer disease ([place])    Anemia    Arrhythmia    Cataract    Colon polyps    Community acquired pneumonia 01/2012   LLL   Congestive heart failure ([place])    Hyperlipidemia    Hypertension    Memory difficulty 10/01/2016   Osteoporosis    Pleural effusion, right 01/2012   SVT (supraventricular tachycardia) ([place]) 01/2012   UTI (lower  urinary tract infection)     Past Surgical History:  Procedure Laterality Date   ABDOMINAL HYSTERECTOMY  1983   APPENDECTOMY  1983   COLONOSCOPY     COLONOSCOPY N/A 06/10/2016   Procedure: COLONOSCOPY;  Surgeon: Manus Gunning, MD;  Location: [facility];  Service: Gastroenterology;  Laterality: N/A;   ESOPHAGOGASTRODUODENOSCOPY N/A 06/10/2016   Procedure: ESOPHAGOGASTRODUODENOSCOPY (EGD);  Surgeon: Manus Gunning, MD;  Location: [facility];  Service: Gastroenterology;  Laterality: N/A;   HEMIARTHROPLASTY HIP  2011   Left femoral neck fracture   RIGHT/LEFT HEART CATH AND CORONARY ANGIOGRAPHY N/A 01/08/2022   Procedure: RIGHT/LEFT HEART CATH AND CORONARY ANGIOGRAPHY;  Surgeon: Leonie Man, MD;  Location: [facility];  Service: Cardiovascular;  Laterality: N/A;   UPPER GASTROINTESTINAL ENDOSCOPY       Medications Prior to  Admission: Prior to Admission medications   Medication Sig Start Date End Date Taking? Authorizing Provider  acetaminophen (TYLENOL) 500 MG tablet Take 1,000 mg by mouth every 6 (six) hours as needed for mild pain or headache.    Yes [provider]  alendronate (FOSAMAX) 70 MG tablet Take 70 mg by mouth every Monday.  09/30/16  Yes [provider]  Alpha-Lipoic Acid 200 MG TABS Take 200 mg by mouth 3 (three) times daily.   Yes [provider]  atenolol (TENORMIN) 25 MG tablet Take 50 mg (2 tablets) in the morning and 25 mg (1 tablet) at night Patient taking differently: Take 25 mg by mouth in the morning, at noon, and at bedtime. 11/26/21  Yes [name], MD  Calcium Carbonate (CALCIUM 600 PO) Take 600 mg by mouth in the morning.   Yes [provider]  cholecalciferol (VITAMIN D3) 25 MCG (1000 UT) tablet Take 2,000 Units by mouth daily.   Yes [provider]  donepezil (ARICEPT) 10 MG tablet Take 1 tablet (10 mg total) by mouth at bedtime. 07/02/21  Yes Suzzanne Cloud, NP  gabapentin (NEURONTIN) 300 MG capsule Take 300 mg by mouth 3 (three) times daily.   Yes [provider]  ipratropium (ATROVENT) 0.06 % nasal spray CAN USE 2 SPRAYS IN EACH NOSTRIL EVERY 6 HOURS IF NEEDED TO DRY UP RUNNY N0SE 12/04/20  Yes Kozlow, Donnamarie Poag, MD  Melatonin 10 MG TABS Take 10-20 mg by mouth at bedtime.   Yes [provider]  memantine (NAMENDA) 5 MG tablet Take 1 tablet (5 mg total) by mouth 2 (two) times daily. 07/02/21  Yes Suzzanne Cloud, NP  Menthol, Topical Analgesic, (BIOFREEZE EX) Apply 1 application topically daily as needed (pain).   Yes [provider]  Multiple Vitamins-Minerals (MULTIPLE VITAMINS/WOMENS PO) Take 1 tablet by mouth daily.   Yes [provider]  potassium chloride (KLOR-CON) 10 MEQ tablet Take one tablet on day you take torsemide 20 mg.  Take 2 tablets on the day you take torsemide 30 mg. 11/28/21  Yes [name],  MD  simvastatin (ZOCOR) 20 MG tablet Take 0.5 tablets (10 mg total) by mouth daily. 12/25/21  Yes Ngetich, [name] C, NP  traZODone (DESYREL) 50 MG tablet Take 50 mg by mouth at bedtime as needed for sleep. 08/08/20  Yes [provider]  vitamin B-12 (CYANOCOBALAMIN) 1000 MCG tablet Take 1,000 mcg by mouth daily.   Yes [provider]  vitamin C (ASCORBIC ACID) 250 MG tablet Take 250 mg by mouth daily.   Yes [provider]  Rivaroxaban (XARELTO) 15 MG TABS tablet  TAKE 1 TABLET (15 MG TOTAL) BY MOUTH DAILY WITH SUPPER. 01/08/22   [name], MD     Allergies:    Allergies  Allergen Reactions   Ciprofloxacin Diarrhea   Sulfa Antibiotics Hives and Rash    Severe Rash   Memantine Other (See Comments)    dizziness   Succinylsulphathiazole Rash   Sulfamethoxazole Rash    Social History:   Social History   Socioeconomic History   Marital status: Widowed    Spouse name: Not on file   Number of children: 2   Years of education: 37   Highest education level: Not on file  Occupational History   Occupation: Retired  Tobacco Use   Smoking status: Never   Smokeless tobacco: Never  Vaping Use   Vaping Use: Never used  Substance and Sexual Activity   Alcohol use: Never   Drug use: Never   Sexual activity: Not on file  Other Topics Concern   Not on file  Social History Narrative   Diet: low sodium/ fluid      Caffeine:Green tea, cheerwine,chocolate      Married, if yes what year: [phone]      Do you live in a house, apartment, assisted living, condo, trailer, ect: House      Is it one or more stories: One      How many persons live in your home? 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 No   Do you have difficulty preparing food or eating? No   Do you have difficulty managing your medications? Yes   Do you have difficulty managing your finances? Yes   Do you have difficulty affording your medications? Yes   Social Determinants of Health   Financial Resource Strain: Not on file  Food Insecurity: Not on file  Transportation Needs: Not on file  Physical Activity: Not on file  Stress: Not on file  Social Connections: Not on file  Intimate Partner Violence: Not on file    Family History:   The patient's family history includes Cancer in her grandchild; Colon polyps in her son; Heart Problems in her father and mother; Heart disease in her son; Neuropathy in her son; Osteoporosis in her mother; Other (age of onset: 61) in her brother; Parkinson's disease in her son; Stroke in her father.    ROS:  Please see the history of present illness.  General:no colds or fevers, no weight changes Skin:no rashes or ulcers HEENT:no blurred vision, no congestion CV:see HPI PUL:see HPI GI:no diarrhea constipation or melena, no indigestion GU:no hematuria, no dysuria MS:no joint pain, no claudication Neuro:no syncope, no lightheadedness Endo:no diabetes, no thyroid disease  All other ROS reviewed and negative.     Physical Exam/Data:   Vitals:   01/08/22 2032 01/09/22 0500 01/09/22 0754 01/09/22 1047  BP: 136/70 125/80    Pulse: 88 97 95 77  Resp: 18 18    Temp: 98.3 F (36.8 C)  98.4 F (36.9 C)    TempSrc: Oral Oral    SpO2: 96% 93% 93% 93%  Weight:      Height:        Intake/Output Summary (Last 24 hours) at 01/09/2022 1230 Last data filed at 01/09/2022 0500 Gross per 24 hour  Intake 460 ml  Output 1550 ml  Net -1090 ml   Last 3 Weights 01/08/2022 12/25/2021 12/17/2021  Weight (lbs) 133 lb 139 lb 134 lb 6.4 oz  Weight (kg) 60.328 kg 63.05 kg 60.963 kg     Body mass index is 22.83 kg/m.  General:  Well nourished,  well developed, in no acute distress, but appears not to feel well HEENT: normal though face somewhat swollen - her family agrees Neck: no JVD at 20 degrees Vascular: No carotid bruits; Distal pulses 1+ bilaterally   Cardiac:  irreg irreg; no murmur gallup rub or click Lungs:  diminished breath sounds to auscultation bilaterally, no wheezing, rhonchi or rales  Abd: soft, nontender, no hepatomegaly  Ext: 3+ lower ext edema from feet to hips Musculoskeletal:  No deformities, BUE and BLE strength normal and equal Skin: warm and dry  Neuro:  alert and oriented with memory issues, no focal abnormalities noted Psych:  Normal affect     Relevant CV Studies: Nuc 09/2018 There was no ST segment deviation noted during stress. The study is normal. Nuclear stress EF: 66%. The left ventricular ejection fraction is hyperdynamic (>65%). This is a low risk study. No evidence of ischemia or previous infarction.   echo 06/14/21  with EF 55-60%, no RWMA, RV normal, LA moderately dilated, along with RA.  Mild MR, mod TR, trivial AR  In 2019 she did have a lexiscan myoview that was normal.    Rt and Lt cardiac cath 01/08/22   Mid LAD lesion is 55% stenosed.   Prox Cx lesion is 30% stenosed.   Prox RCA lesion is 45% stenosed.   Hemodynamic findings consistent with severe pulmonary hypertension.(See Below)   POST-OPERATIVE DIAGNOSIS:   Moderate two-vessel CAD with ~55% mid LAD disease and 45 to 50% proximal RCA disease otherwise minimal disease in the rest of the coronary arteries Severe Combined Pulmonary Hypertension -mostly related to Acute on ChronicDiastolic Heart Failure, clearly with underlying pulmonary disease as well: PAP 68/30 mmHg with mean of 49 mmHg; PCWP is 29 to 30 mmHg,             LV P-EDP 120/16 mmHg - 26 mmHg.            RAP 22 mmHg, RVP-EDP 72/15 mmHg - 26 mmHg;             Ao P8-MAP 132/66 mmHg - 94 mmHg            Moderate his Cardiac Function: Cardiac Output-Index  3.69-2.25.            Permanent atrial fibrillation   PLAN OF CARE:  Admit to inpatient   Given her advanced age, borderline renal function, and significant pulmonary hypertension both combined pulmonary and secondary diastolic LV failure with elevated filling pressures, reasonable to admit to the hospital to allow for IV diuresis and possible consideration of milrinone inotropic support.->  I have written for IV Lasix 40 MG bid, however I suspect she may need inotropic assistance.  With setting of atrial fibrillation, would probably recommend milrinone. Restart Xarelto tonight         Laboratory Data:  High Sensitivity Troponin:  No results for input(s):  TROPONINIHS in the last 720 hours.    Chemistry Recent Labs  Lab 01/09/22 0253  NA 138  K 3.9  CL 108  CO2 22  GLUCOSE 89  BUN 37*  CREATININE 1.39*  CALCIUM 8.2*  GFRNONAA 37*  ANIONGAP 8    Recent Labs  Lab 01/09/22 0253  PROT 5.6*  ALBUMIN 3.1*  AST 26  ALT 21  ALKPHOS 99  BILITOT 1.3*   Lipids No results for input(s): CHOL, TRIG, HDL, LABVLDL, LDLCALC, CHOLHDL in the last 168 hours. HematologyNo results for input(s): WBC, RBC, HGB, HCT, MCV, MCH, MCHC, RDW, PLT in the last 168 hours. Thyroid No results for input(s): TSH, FREET4 in the last 168 hours. BNPNo results for input(s): BNP, PROBNP in the last 168 hours.  DDimer No results for input(s): DDIMER in the last 168 hours.   Radiology/Studies:  CARDIAC CATHETERIZATION  Result Date: 01/08/2022   Mid LAD lesion is 55% stenosed.   Prox Cx lesion is 30% stenosed.   Prox RCA lesion is 45% stenosed.   Hemodynamic findings consistent with severe pulmonary hypertension.(See Below) POST-OPERATIVE DIAGNOSIS:  Moderate two-vessel CAD with ~55% mid LAD disease and 45 to 50% proximal RCA disease otherwise minimal disease in the rest of the coronary arteries Severe Combined Pulmonary Hypertension -mostly related to Acute on ChronicDiastolic Heart Failure, clearly with  underlying pulmonary disease as well: PAP 68/30 mmHg with mean of 49 mmHg; PCWP is 29 to 30 mmHg,  LV P-EDP 120/16 mmHg - 26 mmHg.  RAP 22 mmHg, RVP-EDP 72/15 mmHg - 26 mmHg;  Ao P8-MAP 132/66 mmHg - 94 mmHg  Moderate his Cardiac Function: Cardiac Output-Index 3.69-2.25.  Permanent atrial fibrillation PLAN OF CARE: Admit to inpatient  Given her advanced age, borderline renal function, and significant pulmonary hypertension both combined pulmonary and secondary diastolic LV failure with elevated filling pressures, reasonable to admit to the hospital to allow for IV diuresis and possible consideration of milrinone inotropic support.-> I have written for IV Lasix 40 MG bid, however I suspect she may need inotropic assistance.  With setting of atrial fibrillation, would probably recommend milrinone. Restart Xarelto tonight Glenetta Hew, MD    Assessment and Plan:   DOE with cath 01/08/22 and non obstructive CAD but + diastolic CHF along with pulmonary disease/pulmonary hypertension/chronic diastolic CHF- pt admitted for diuresis on lasix 40 mg BID.  Now neg 1090.  No wt today.  Had been on torsemide until 12/19 also on empagliflozin which was stopped as well. Had at least 10 lb wt gain over a week.  Will check PCXR today with continue edema.  Dr. Debara Pickett to see, may need to increase diuretic, possible monitor co-ox  may need inotrope will defer to Dr. Debara Pickett.  Pulmonary hypertension/pulmonary disease has not seen pulmonary  Non obst CAD - she is on zocor no ASA, but is on xarelto so no need for ASA - on BB  Permanent atrial fib on xarelto. And BB. Rate is elevated to 120 at times may be from volume overload. Monitor  HLD on zocor with lipids in 04/2021 of HDL 45, LDL 77 TG 95 Tchol 140    Risk Assessment/Risk Scores:       New York Heart Association (NYHA) Functional Class NYHA Class III  CHA2DS2-VASc Score = 5   This indicates a 7.2% annual risk of stroke. The patient's score is based upon: CHF  History: 1 HTN History: 1 Diabetes History: 0 Stroke History: 0 Vascular Disease History: 0 Age Score: 2  Gender Score: 1      Severity of Illness: The appropriate patient status for this patient is INPATIENT. Inpatient status is judged to be reasonable and necessary in order to provide the required intensity of service to ensure the patient's safety. The patient's presenting symptoms, physical exam findings, and initial radiographic and laboratory data in the context of their chronic comorbidities is felt to place them at high risk for further clinical deterioration. Furthermore, it is not anticipated that the patient will be medically stable for discharge from the hospital within 2 midnights of admission.   * I certify that at the point of admission it is my clinical judgment that the patient will require inpatient hospital care spanning beyond 2 midnights from the point of admission due to high intensity of service, high risk for further deterioration and high frequency of surveillance required.*   For questions or updates, please contact [place] Please consult [email or web address] for contact info under     Signed, Cecilie Kicks, NP  01/09/2022 12:30 PM

## 2022-01-09 NOTE — Progress Notes (Signed)
I talked to pt's son about plan but if someone could talk with him tomorrow as well.  His number is [place], [place]  [phone] or after 5pm and on weekends call [phone] 01/09/2022.

## 2022-01-09 NOTE — Plan of Care (Signed)
Problem: Cardiovascular: Goal: Ability to achieve and maintain adequate cardiovascular perfusion will improve Outcome: Progressing   Problem: Health Behavior/Discharge Planning: Goal: Ability to safely manage health-related needs after discharge will improve Outcome: Progressing

## 2022-01-10 ENCOUNTER — Inpatient Hospital Stay (HOSPITAL_COMMUNITY): Payer: Medicare Other

## 2022-01-10 DIAGNOSIS — R0609 Other forms of dyspnea: Secondary | ICD-10-CM

## 2022-01-10 LAB — MAGNESIUM: Magnesium: 1.9 mg/dL (ref 1.7–2.4)

## 2022-01-10 NOTE — Progress Notes (Signed)
Mobility Specialist Progress Note    01/10/22 1543  Mobility  Activity Ambulated in room  Level of Assistance Moderate assist, patient does 50-74%  Assistive Device Front wheel walker  Distance Ambulated (ft) 10 ft  Mobility Out of bed for toileting  Mobility Response Tolerated fair  Mobility performed by Mobility specialist  Bed Position Chair  $Mobility charge 1 Mobility   Pt received in bed and agreeable. Pt modA to sit up and to stand. C/o pain and soreness. Had successful void on BSC. Left in chair with call bell in reach and NT present, RN notified.   [facility] Mobility Specialist  M.S. 2C and 6E: [phone] M.S. 4E: [phone]

## 2022-01-10 NOTE — Progress Notes (Signed)
Modified Barium Swallow Progress Note  Patient Details  Name: [name] Dyer MRN: [id number] Date of Birth: [birth year]  Today's Date: 01/10/2022  Modified Barium Swallow completed.  Full report located under Chart Review in the Imaging Section.  Brief recommendations include the following:  Clinical Impression  [name] oropharyngeal swallowing is functional with adequate efficiency and no aspiration. She had intermittent throat clearing and coughing throughout the study despite no aspiration occurring. She did appear to have barium, including barium tablet, remaining in her esophagus upon completion of the study (MD not present to confirm). Recommend that she continue with regular solids and thin liquids, using aspiration and esophageal precautions. SLP will sign off acutely and defer any potential esophageal w/u to MD discretion.   Swallow Evaluation Recommendations   Recommended Consults: Consider esophageal assessment   SLP Diet Recommendations: Regular solids;Thin liquid   Liquid Administration via: Cup;Straw   Medication Administration: Whole meds with liquid (crush larger pills)   Supervision: Patient able to self feed   Compensations: Slow rate;Small sips/bites;Follow solids with liquid   Postural Changes: Remain semi-upright after after feeds/meals (Comment);Seated upright at 90 degrees   Oral Care Recommendations: Oral care BID        Osie Bond., M.A. [place] Pager [phone] Office [phone]  01/10/2022,3:21 PM

## 2022-01-10 NOTE — Evaluation (Signed)
Clinical/Bedside Swallow Evaluation Patient Details  Name: [name] Dyer MRN: [id number] Date of Birth: December 21, 1932  Today's Date: 01/10/2022 Time: SLP Start Time (ACUTE ONLY): 0830 SLP Stop Time (ACUTE ONLY): 0850 SLP Time Calculation (min) (ACUTE ONLY): 20 min  Past Medical History:  Past Medical History:  Diagnosis Date   Alzheimer disease ([place])    Anemia    Arrhythmia    Cataract    Colon polyps    Community acquired pneumonia 01/2012   LLL   Congestive heart failure ([place])    Hyperlipidemia    Hypertension    Memory difficulty 10/01/2016   Osteoporosis    Pleural effusion, right 01/2012   SVT (supraventricular tachycardia) ([place]) 01/2012   UTI (lower urinary tract infection)    Past Surgical History:  Past Surgical History:  Procedure Laterality Date   [place]   COLONOSCOPY     COLONOSCOPY N/A 06/10/2016   Procedure: COLONOSCOPY;  Surgeon: Manus Gunning, MD;  Location: [facility];  Service: Gastroenterology;  Laterality: N/A;   ESOPHAGOGASTRODUODENOSCOPY N/A 06/10/2016   Procedure: ESOPHAGOGASTRODUODENOSCOPY (EGD);  Surgeon: Manus Gunning, MD;  Location: [facility];  Service: Gastroenterology;  Laterality: N/A;   HEMIARTHROPLASTY HIP  2011   Left femoral neck fracture   RIGHT/LEFT HEART CATH AND CORONARY ANGIOGRAPHY N/A 01/08/2022   Procedure: RIGHT/LEFT HEART CATH AND CORONARY ANGIOGRAPHY;  Surgeon: Leonie Man, MD;  Location: [facility];  Service: Cardiovascular;  Laterality: N/A;   UPPER GASTROINTESTINAL ENDOSCOPY     HPI:  RICHELL CORKER is a 88 y.o. female with hx of atrial fib chronic diastolic CHF, MR, HTN, HL and admit 01/2020 with CHF who is being seen 01/09/2022 for the evaluation of CHF.    Assessment / Plan / Recommendation  Clinical Impression  Patient presents with a suspected primary eosphageal dysphagia with potential secondary pharyngeal deficits. Primary compaint is globus  and "heavy feeling in throat" with po intake however patient does present with multiple swallows and throat clearing, primarily with liquids. This could be related to globus sensation and/or decreased clearance of bolus from pharynx with resultant penetration/aspiration. Note that patient had a referral to OP SLP services prior to admission who recommended an MBS prior to their evaluation. Agree with need for instrumental testing to determine origin of difficulty. Will proceed with MBS. SLP Visit Diagnosis: Dysphagia, pharyngoesophageal phase (R13.14)    Aspiration Risk       Diet Recommendation Regular;Thin liquid   Liquid Administration via: Cup;Straw Medication Administration: Whole meds with liquid Supervision: Patient able to self feed Compensations: Slow rate;Small sips/bites;Follow solids with liquid Postural Changes: Seated upright at 90 degrees;Remain upright for at least 30 minutes after po intake    Other  Recommendations Oral Care Recommendations: Oral care BID    Recommendations for follow up therapy are one component of a multi-disciplinary discharge planning process, led by the attending physician.  Recommendations may be updated based on patient status, additional functional criteria and insurance authorization.  Follow up Recommendations Other (comment)        Swallow Study   General HPI: HILIANA EILTS is a 88 y.o. female with hx of atrial fib chronic diastolic CHF, MR, HTN, HL and admit 01/2020 with CHF who is being seen 01/09/2022 for the evaluation of CHF. Type of Study: Bedside Swallow Evaluation Previous Swallow Assessment: none Diet Prior to this Study: Regular;Thin liquids Temperature Spikes Noted: No Respiratory Status: Room air History of Recent  Intubation: No Behavior/Cognition: Alert;Cooperative;Pleasant mood Oral Cavity Assessment: Within Functional Limits Oral Care Completed by SLP: Recent completion by staff Oral Cavity - Dentition: Adequate natural  dentition Vision: Functional for self-feeding Self-Feeding Abilities: Able to feed self Patient Positioning: Upright in bed Baseline Vocal Quality: Normal Volitional Cough: Strong Volitional Swallow: Able to elicit    Oral/Motor/Sensory Function Overall Oral Motor/Sensory Function: Within functional limits   Ice Chips Ice chips: Not tested   Thin Liquid Thin Liquid: Impaired Presentation: Cup;Self Fed Pharyngeal  Phase Impairments: Multiple swallows;Throat Clearing - Immediate (belching, c/o globus)    Nectar Thick Nectar Thick Liquid: Not tested   Honey Thick Honey Thick Liquid: Not tested   Puree Puree: Impaired Presentation: Self Fed;Spoon Other Comments: c/o globus, "heavy feeling in throat"   Solid     Solid: Impaired Presentation: Self Fed Other Comments: c/o globus     Idamay Hosein MA, CCC-SLP  Jessikah Dicker Meryl 01/10/2022,8:51 AM

## 2022-01-10 NOTE — Progress Notes (Signed)
Progress Note  Patient Name: [name] Dyer Date of Encounter: 01/10/2022  [facility] Cardiologist: [name], MD   Subjective   No acute overnight events. Patient reports breathing is better but still having off and on shortness of breath. No chest pain. Her biggest complaints this morning is pain in her feet which she states is due to neuropathy.  Inpatient Medications    Scheduled Meds:  vitamin C  250 mg Oral Daily   atenolol  50 mg Oral BID   calcium carbonate  1,250 mg Oral q AM   donepezil  10 mg Oral QHS   furosemide  80 mg Intravenous BID   gabapentin  300 mg Oral TID   melatonin  10 mg Oral QHS   memantine  5 mg Oral BID   rivaroxaban  15 mg Oral Q supper   simvastatin  10 mg Oral Daily   sodium chloride flush  3 mL Intravenous Q12H   vitamin B-12  1,000 mcg Oral Daily   Continuous Infusions:  sodium chloride     PRN Meds: sodium chloride, acetaminophen, acetaminophen, ipratropium, Muscle Rub, ondansetron (ZOFRAN) IV, sodium chloride flush, traZODone   Vital Signs    Vitals:   01/09/22 1700 01/09/22 2003 01/09/22 2123 01/10/22 0453  BP:  128/81  127/85  Pulse: 90 (!) 131 (!) 104 73  Resp:      Temp:  97.8 F (36.6 C)  98.6 F (37 C)  TempSrc:  Oral  Oral  SpO2: 93% 94%  91%  Weight:    66.2 kg  Height:        Intake/Output Summary (Last 24 hours) at 01/10/2022 0812 Last data filed at 01/10/2022 0453 Gross per 24 hour  Intake 240 ml  Output 2930 ml  Net -2690 ml   Last 3 Weights 01/10/2022 01/08/2022 12/25/2021  Weight (lbs) 145 lb 15.1 oz 133 lb 139 lb  Weight (kg) 66.2 kg 60.328 kg 63.05 kg      Telemetry    Atrial fibrillation with rates mostly in the 70s to 90s (occasionally in the low 100s). Frequent PVCs. - Personally Reviewed  ECG    No ECG tracing this admission. - Personally Reviewed  Physical Exam   GEN: Elderly Caucasian female in no acute distress.   Neck: No JVD. Cardiac: Irregularly irregular rhythm with normal  rate. No murmurs, rubs, or gallops.  Respiratory: Clear to auscultation bilaterally. No wheezes, rhonchi, or rales. GI: Soft, non-distended, and non-tender. MS: 1+ pitting edema of bilateral lower extremities. No deformity. Skin: Warm and dry. Neuro:  No focal deficits. Psych: Normal affect. Responds appropriately.  Labs    High Sensitivity Troponin:  No results for input(s): TROPONINIHS in the last 720 hours.   Chemistry Recent Labs  Lab 01/08/22 1319 01/08/22 1320 01/09/22 0253  NA 141 141 138  K 4.6 4.6 3.9  CL  --   --  108  CO2  --   --  22  GLUCOSE  --   --  89  BUN  --   --  37*  CREATININE  --   --  1.39*  CALCIUM  --   --  8.2*  PROT  --   --  5.6*  ALBUMIN  --   --  3.1*  AST  --   --  26  ALT  --   --  21  ALKPHOS  --   --  99  BILITOT  --   --  1.3*  GFRNONAA  --   --  32*  ANIONGAP  --   --  8    Lipids No results for input(s): CHOL, TRIG, HDL, LABVLDL, LDLCALC, CHOLHDL in the last 168 hours.  Hematology Recent Labs  Lab 01/08/22 1313 01/08/22 1319 01/08/22 1320  HGB 12.9 12.9 13.3  HCT 38.0 38.0 39.0   Thyroid No results for input(s): TSH, FREET4 in the last 168 hours.  BNPNo results for input(s): BNP, PROBNP in the last 168 hours.  DDimer No results for input(s): DDIMER in the last 168 hours.   Radiology    CARDIAC CATHETERIZATION  Result Date: 01/08/2022   Mid LAD lesion is 55% stenosed.   Prox Cx lesion is 30% stenosed.   Prox RCA lesion is 45% stenosed.   Hemodynamic findings consistent with severe pulmonary hypertension.(See Below) POST-OPERATIVE DIAGNOSIS:  Moderate two-vessel CAD with ~55% mid LAD disease and 45 to 50% proximal RCA disease otherwise minimal disease in the rest of the coronary arteries Severe Combined Pulmonary Hypertension -mostly related to Acute on ChronicDiastolic Heart Failure, clearly with underlying pulmonary disease as well: PAP 68/30 mmHg with mean of 49 mmHg; PCWP is 29 to 30 mmHg,  LV P-EDP 120/16 mmHg - 26 mmHg.   RAP 22 mmHg, RVP-EDP 72/15 mmHg - 26 mmHg;  Ao P8-MAP 132/66 mmHg - 94 mmHg  Moderate his Cardiac Function: Cardiac Output-Index 3.69-2.25.  Permanent atrial fibrillation PLAN OF CARE: Admit to inpatient  Given her advanced age, borderline renal function, and significant pulmonary hypertension both combined pulmonary and secondary diastolic LV failure with elevated filling pressures, reasonable to admit to the hospital to allow for IV diuresis and possible consideration of milrinone inotropic support.-> I have written for IV Lasix 40 MG bid, however I suspect she may need inotropic assistance.  With setting of atrial fibrillation, would probably recommend milrinone. Restart Xarelto tonight Glenetta Hew, MD  DG CHEST PORT 1 VIEW  Result Date: 01/09/2022 CLINICAL DATA:  Shortness of breath.  Edema. EXAM: PORTABLE CHEST 1 VIEW COMPARISON:  05/05/2018 FINDINGS: Cardiac silhouette is moderately enlarged, and this appears unchanged to mildly worsened from prior. Mediastinal contours are grossly within normal limits. Decreased lung volumes. New mild-to-moderate bilateral mid and lower lung interstitial thickening, greatest at the right lung base. Mild right-greater-than-left basilar heterogeneous airspace opacity. No large pleural effusion. No pneumothorax. 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 Moderate multilevel degenerative osteophytes of the thoracic spine. IMPRESSION:: IMPRESSION: 1. Moderate enlargement of the cardiac silhouette, unchanged to minimally worsened from prior noting PA technique on the prior study and AP technique on the current study. 2. Mild bilateral interstitial pulmonary edema. 3. Mild right-greater-than-left basilar heterogeneous airspace opacities, possible alveolar pulmonary edema versus atelectasis. Electronically Signed   By: Yvonne Kendall   On: 01/09/2022 14:04   ECHOCARDIOGRAM COMPLETE  Result Date: 01/09/2022    ECHOCARDIOGRAM REPORT   Patient Name:   [name] Dyer Date of Exam: 01/09/2022 Medical Rec #:  [id number]         Height:       64.0 in Accession #:    [id number]        Weight:       133.0 lb Date of Birth:  [birth year]         BSA:          1.645 m Patient Age:    88 years          BP:  138/66 mmHg Patient Gender: F                 HR:           93 bpm. Exam Location:  Inpatient Procedure: 2D Echo, Cardiac Doppler and Color Doppler Indications:    Dyspnea  History:        Patient has prior history of Echocardiogram examinations, most                 recent 06/04/2021. Arrythmias:Atrial Fibrillation,                 Signs/Symptoms:Dyspnea; Risk Factors:Hypertension and                 Dyslipidemia. Anemia.  Sonographer:    Clayton Lefort RDCS (AE) Referring Phys: Glen Raven  1. Left ventricular ejection fraction, by estimation, is 50 to 55%. Left ventricular ejection fraction by PLAX is 52 %. The left ventricle has low normal function. The left ventricle has no regional wall motion abnormalities. There is mild left ventricular hypertrophy. Left ventricular diastolic function could not be evaluated. There is the interventricular septum is flattened in systole and diastole, consistent with right ventricular pressure and volume overload.  2. Right ventricular systolic function is moderately reduced. The right ventricular size is mildly enlarged. There is mildly elevated pulmonary artery systolic pressure. The estimated right ventricular systolic pressure is 16.3 mmHg.  3. Right atrial size was moderately dilated.  4. A small pericardial effusion is present. The pericardial effusion is circumferential. There is no evidence of cardiac tamponade.  5. The mitral valve is abnormal. Mild mitral valve regurgitation.  6. Tricuspid valve regurgitation is moderate.  7. The aortic valve is tricuspid. Aortic valve regurgitation is not visualized.  8. The inferior vena cava is normal in size with <50% respiratory variability, suggesting right atrial  pressure of 8 mmHg.  9. Rhythm strip during this exam demostrated atrial fibrillation. Comparison(s): Changes from prior study are noted. 06/14/2021: LVEF 55-60%, normal RV function, moderate biatrial enlargement. FINDINGS  Left Ventricle: Left ventricular ejection fraction, by estimation, is 50 to 55%. Left ventricular ejection fraction by PLAX is 52 %. The left ventricle has low normal function. The left ventricle has no regional wall motion abnormalities. The left ventricular internal cavity size was normal in size. There is mild left ventricular hypertrophy. The interventricular septum is flattened in systole and diastole, consistent with right ventricular pressure and volume overload. Left ventricular diastolic function could not be evaluated due to atrial fibrillation. Left ventricular diastolic function could not be evaluated. Right Ventricle: The right ventricular size is mildly enlarged. No increase in right ventricular wall thickness. Right ventricular systolic function is moderately reduced. There is mildly elevated pulmonary artery systolic pressure. The tricuspid regurgitant velocity is 3.04 m/s, and with an assumed right atrial pressure of 8 mmHg, the estimated right ventricular systolic pressure is 84.5 mmHg. Left Atrium: Left atrial size was normal in size. Right Atrium: Right atrial size was moderately dilated. Pericardium: A small pericardial effusion is present. The pericardial effusion is circumferential. There is no evidence of cardiac tamponade. Mitral Valve: The mitral valve is abnormal. There is mild thickening of the mitral valve leaflet(s). Mild mitral valve regurgitation, with posteriorly-directed jet. Tricuspid Valve: The tricuspid valve is grossly normal. Tricuspid valve regurgitation is moderate. Aortic Valve: The aortic valve is tricuspid. Aortic valve regurgitation is not visualized. Aortic valve mean gradient measures 2.0 mmHg. Aortic valve peak gradient measures 3.1 mmHg.  Aortic  valve area, by VTI measures 1.49 cm. Pulmonic Valve: The pulmonic valve was grossly normal. Pulmonic valve regurgitation is trivial. Aorta: The aortic root and ascending aorta are structurally normal, with no evidence of dilitation. Venous: The inferior vena cava is normal in size with less than 50% respiratory variability, suggesting right atrial pressure of 8 mmHg. IAS/Shunts: No atrial level shunt detected by color flow Doppler. EKG: Rhythm strip during this exam demostrated atrial fibrillation.  LEFT VENTRICLE PLAX 2D LV EF:         Left ventricular ejection fraction by PLAX is 52 %. LVIDd:         3.80 cm LVIDs:         2.80 cm LV PW:         1.10 cm LV IVS:        0.90 cm LVOT diam:     1.60 cm LV SV:         24 LV SV Index:   15 LVOT Area:     2.01 cm  RIGHT VENTRICLE            IVC RV Basal diam:  3.20 cm    IVC diam: 1.60 cm RV S prime:     5.19 cm/s TAPSE (M-mode): 1.0 cm LEFT ATRIUM           Index        RIGHT ATRIUM           Index LA diam:      4.00 cm 2.43 cm/m   RA Area:     23.20 cm LA Vol (A4C): 36.7 ml 22.31 ml/m  RA Volume:   72.30 ml  43.95 ml/m  AORTIC VALVE AV Area (Vmax):    1.49 cm AV Area (Vmean):   1.52 cm AV Area (VTI):     1.49 cm AV Vmax:           87.80 cm/s AV Vmean:          59.000 cm/s AV VTI:            0.163 m AV Peak Grad:      3.1 mmHg AV Mean Grad:      2.0 mmHg LVOT Vmax:         64.90 cm/s LVOT Vmean:        44.700 cm/s LVOT VTI:          0.121 m LVOT/AV VTI ratio: 0.74  AORTA Ao Root diam: 3.20 cm Ao Asc diam:  3.00 cm TRICUSPID VALVE TR Peak grad:   37.0 mmHg TR Vmax:        304.00 cm/s  SHUNTS Systemic VTI:  0.12 m Systemic Diam: 1.60 cm Lyman Bishop MD Electronically signed by Lyman Bishop MD Signature Date/Time: 01/09/2022/5:33:36 PM    Final     Cardiac Studies   Right/Left Cardiac Catheterization 01/08/2022:   Mid LAD lesion is 55% stenosed.   Prox Cx lesion is 30% stenosed.   Prox RCA lesion is 45% stenosed.   Hemodynamic findings consistent with  severe pulmonary hypertension.(See Below)   POST-OPERATIVE DIAGNOSIS:   Moderate two-vessel CAD with ~55% mid LAD disease and 45 to 50% proximal RCA disease otherwise minimal disease in the rest of the coronary arteries Severe Combined Pulmonary Hypertension -mostly related to Acute on ChronicDiastolic Heart Failure, clearly with underlying pulmonary disease as well: PAP 68/30 mmHg with mean of 49 mmHg; PCWP is 29 to 30 mmHg,  LV P-EDP 120/16 mmHg - 26 mmHg.            RAP 22 mmHg, RVP-EDP 72/15 mmHg - 26 mmHg;             Ao P8-MAP 132/66 mmHg - 94 mmHg            Moderate his Cardiac Function: Cardiac Output-Index 3.69-2.25.            Permanent atrial fibrillation   PLAN OF CARE:  Admit to inpatient   Given her advanced age, borderline renal function, and significant pulmonary hypertension both combined pulmonary and secondary diastolic LV failure with elevated filling pressures, reasonable to admit to the hospital to allow for IV diuresis and possible consideration of milrinone inotropic support.->  I have written for IV Lasix 40 MG bid, however I suspect she may need inotropic assistance.  With setting of atrial fibrillation, would probably recommend milrinone. Restart Xarelto tonight  Diagnostic Dominance: Right  _______________  Echocardiogram 01/09/2022: Impressions: 1. Left ventricular ejection fraction, by estimation, is 50 to 55%. Left  ventricular ejection fraction by PLAX is 52 %. The left ventricle has low  normal function. The left ventricle has no regional wall motion  abnormalities. There is mild left  ventricular hypertrophy. Left ventricular diastolic function could not be  evaluated. There is the interventricular septum is flattened in systole  and diastole, consistent with right ventricular pressure and volume  overload.   2. Right ventricular systolic function is moderately reduced. The right  ventricular size is mildly enlarged. There is mildly  elevated pulmonary  artery systolic pressure. The estimated right ventricular systolic  pressure is 30.9 mmHg.   3. Right atrial size was moderately dilated.   4. A small pericardial effusion is present. The pericardial effusion is  circumferential. There is no evidence of cardiac tamponade.   5. The mitral valve is abnormal. Mild mitral valve regurgitation.   6. Tricuspid valve regurgitation is moderate.   7. The aortic valve is tricuspid. Aortic valve regurgitation is not  visualized.   8. The inferior vena cava is normal in size with <50% respiratory  variability, suggesting right atrial pressure of 8 mmHg.   9. Rhythm strip during this exam demostrated atrial fibrillation.   Comparison(s): Changes from prior study are noted. 06/14/2021: LVEF 55-60%,  normal RV function, moderate biatrial enlargement.   Patient Profile     88 y.o. female with a history of chronic diastolic CHF, permanent atrial fibrillation on Xarelto, paroxysmal SVT, hypertension, hyperlipidemia, CKD stage III, and Alzheimer disease who presented for planned outpatient right/left cardiac catheterization on 01/08/2022. Cath showed elevated filling pressures with cardiac output of 3.69 and cardiac index of 2.25. She was addmitted for IV diuresis for acute on chronic diastolic CHF.  Assessment & Plan    Acute on Chronic Diastolic CHF  R/LHC on 04/05/6807 showed non-obstructive CAD with severe combined pulmonary hypertension mostly due to acute on chronic diastolic CHF. LVEDP was elevated at 26mHg. PCWP was 29-30 mmHg. Cardiac output 3.69 and cardiac index 2.25. Echo showed LVEF of 50-55% with normal wall motion and mild LVH. Interventricular septum flattened in systole and diastole consistent with RV pressure and volume overload. RV mildly enlarged with moderately reduced systolic function and mildly elevated PASP of 45.0 mmHg. She was initially started IV Lasix 424mtwice daily and then increased to 8045mwice daily.  Responding well to diuresis. Net negative 3.78 L this admission. - Patient still has evidence of volume overload but improved. -  Continue current dose of IV Lasix 61m twice daily. - Home Atenolol was decreased to 571mtwice daily given decreased cardiac output and cardiac index. - Monitor daily weights, strict I/Os, and renal function.  Non-Obstructive CAD Noted on cath this admission. - No angina. - No Aspirin due to need for DOAC. - Continue beta-blocker and statin.  Permanent Atrial Fibrillation Rate controlled. Rates mostly in the 70s to 90s. - Continue Atenolol 5025mwice daily. - Continue Xarelto 58m81mily.  Hypertension BP well controlled. - Continue Atenolol as above.  Hyperlipidemia Lipid panel in 11/2021: Total Cholesterol 123, Triglycerides 79, HDL 40, LDL 67. - Continue Simvastatin 10mg53mly.  CKD Stage III Recent baseline around 1.4 to 1.7. - Repeat BMET today pending.  Alzheimer's Disease Continue home Aricept and Namenda.  For questions or updates, please contact [facility] consult [email or web address] for contact info under        Signed, CalliDarreld McleanC  01/10/2022, 8:12 AM

## 2022-01-11 DIAGNOSIS — M109 Gout, unspecified: Secondary | ICD-10-CM

## 2022-01-11 LAB — BASIC METABOLIC PANEL
Anion gap: 12 (ref 5–15)
BUN: 37 mg/dL — ABNORMAL HIGH (ref 8–23)
CO2: 34 mmol/L — ABNORMAL HIGH (ref 22–32)
Calcium: 8.8 mg/dL — ABNORMAL LOW (ref 8.9–10.3)
Chloride: 96 mmol/L — ABNORMAL LOW (ref 98–111)
Creatinine, Ser: 1.58 mg/dL — ABNORMAL HIGH (ref 0.44–1.00)
GFR, Estimated: 31 mL/min — ABNORMAL LOW (ref >60)
Glucose, Bld: 108 mg/dL — ABNORMAL HIGH (ref 70–99)
Potassium: 3.1 mmol/L — ABNORMAL LOW (ref 3.5–5.1)
Sodium: 142 mmol/L (ref 135–145)

## 2022-01-11 MED ORDER — POTASSIUM CHLORIDE 20 MEQ PO PACK
60.0000 meq | PACK | Freq: Once | ORAL | Status: AC
  Administered 2022-01-11: 60 meq via ORAL
  Filled 2022-01-11: qty 3

## 2022-01-11 MED ORDER — COLCHICINE 0.6 MG PO TABS
1.2000 mg | ORAL_TABLET | Freq: Every day | ORAL | Status: AC
  Administered 2022-01-11: 1.2 mg via ORAL
  Filled 2022-01-11: qty 2

## 2022-01-11 MED ORDER — COLCHICINE 0.6 MG PO TABS
0.6000 mg | ORAL_TABLET | Freq: Every day | ORAL | Status: DC
  Administered 2022-01-11 – 2022-01-17 (×7): 0.6 mg via ORAL
  Filled 2022-01-11 (×7): qty 1

## 2022-01-11 NOTE — Progress Notes (Signed)
Progress Note  Patient Name: [name] Dyer Date of Encounter: 01/11/2022  [facility] Cardiologist: [name], MD   Subjective   She feels uncomfortable with her laying position. She denies dyspnea.  Normal blood pressures Rate controlled afib RA Crt 1.39->1.58 on 80 IV BID, net negative 1.2 L. Total net negative 6.2 L   Inpatient Medications    Scheduled Meds:  vitamin C  250 mg Oral Daily   atenolol  50 mg Oral BID   calcium carbonate  1,250 mg Oral q AM   donepezil  10 mg Oral QHS   furosemide  80 mg Intravenous BID   gabapentin  300 mg Oral TID   melatonin  10 mg Oral QHS   memantine  5 mg Oral BID   rivaroxaban  15 mg Oral Q supper   simvastatin  10 mg Oral Daily   sodium chloride flush  3 mL Intravenous Q12H   vitamin B-12  1,000 mcg Oral Daily   Continuous Infusions:  sodium chloride     PRN Meds: sodium chloride, acetaminophen, acetaminophen, ipratropium, Muscle Rub, ondansetron (ZOFRAN) IV, sodium chloride flush, traZODone   Vital Signs    Vitals:   01/10/22 1707 01/10/22 2136 01/11/22 0548 01/11/22 0909  BP: (!) 154/68 139/71 129/62 116/76  Pulse: 89 100 77 99  Resp: _0 Temp: 98 F (36.7 C) 98.4 F (36.9 C) 97.6 F (36.4 C) 98.3 F (36.8 C)  TempSrc: Oral Oral Oral Oral  SpO2: 98% 97% 98% 98%  Weight:   61.9 kg   Height:        Intake/Output Summary (Last 24 hours) at 01/11/2022 1028 Last data filed at 01/11/2022 0900 Gross per 24 hour  Intake 240 ml  Output 1250 ml  Net -1010 ml   Last 3 Weights 01/11/2022 01/10/2022 01/08/2022  Weight (lbs) 136 lb 7.4 oz 145 lb 15.1 oz 133 lb  Weight (kg) 61.9 kg 66.2 kg 60.328 kg      Telemetry    Atrial fibrillation rate controlled. - Personally Reviewed  ECG    No ECG tracing this admission. - Personally Reviewed  Physical Exam   Physical Exam Gen: elderly, no distress Neuro: alert and oriented CV: irregular rhythm, no murmurs. elevated JVD Vasc: 2+ radial pulses Pulm:  normal wob, decreased coarse BS in the bases Abd: non distended Ext: No LE edema. Tophi on right DIP Skin: warm and well perfused Psych: normal mood   Labs    High Sensitivity Troponin:  No results for input(s): TROPONINIHS in the last 720 hours.   Chemistry Recent Labs  Lab 01/08/22 1320 01/09/22 0253 01/10/22 1100 01/11/22 0254  NA 141 138  --  142  K 4.6 3.9  --  3.1*  CL  --  108  --  96*  CO2  --  22  --  34*  GLUCOSE  --  89  --  108*  BUN  --  37*  --  37*  CREATININE  --  1.39*  --  1.58*  CALCIUM  --  8.2*  --  8.8*  MG  --   --  1.9  --   PROT  --  5.6*  --   --   ALBUMIN  --  3.1*  --   --   AST  --  26  --   --   ALT  --  21  --   --   ALKPHOS  --  99  --   --  BILITOT  --  1.3*  --   --   GFRNONAA  --  37*  --  31*  ANIONGAP  --  8  --  12    Lipids No results for input(s): CHOL, TRIG, HDL, LABVLDL, LDLCALC, CHOLHDL in the last 168 hours.  Hematology Recent Labs  Lab 01/08/22 1313 01/08/22 1319 01/08/22 1320  HGB 12.9 12.9 13.3  HCT 38.0 38.0 39.0   Thyroid No results for input(s): TSH, FREET4 in the last 168 hours.  BNPNo results for input(s): BNP, PROBNP in the last 168 hours.  DDimer No results for input(s): DDIMER in the last 168 hours.   Radiology    DG CHEST PORT 1 VIEW  Result Date: 01/09/2022 CLINICAL DATA:  Shortness of breath.  Edema. EXAM: PORTABLE CHEST 1 VIEW COMPARISON:  05/05/2018 FINDINGS: Cardiac silhouette is moderately enlarged, and this appears unchanged to mildly worsened from prior. Mediastinal contours are grossly within normal limits. Decreased lung volumes. New mild-to-moderate bilateral mid and lower lung interstitial thickening, greatest at the right lung base. Mild right-greater-than-left basilar heterogeneous airspace opacity. No large pleural effusion. No pneumothorax. 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 Moderate multilevel degenerative osteophytes of the thoracic spine. IMPRESSION:: IMPRESSION: 1.  Moderate enlargement of the cardiac silhouette, unchanged to minimally worsened from prior noting PA technique on the prior study and AP technique on the current study. 2. Mild bilateral interstitial pulmonary edema. 3. Mild right-greater-than-left basilar heterogeneous airspace opacities, possible alveolar pulmonary edema versus atelectasis. Electronically Signed   By: Yvonne Kendall   On: 01/09/2022 14:04   DG Swallowing Func-Speech Pathology  Result Date: 01/10/2022 Table formatting from the original result was not included. Objective Swallowing Evaluation: Type of Study: MBS-Modified Barium Swallow Study  Patient Details Name: [name] MRN: [id number] Date of Birth: December 05, 1932 Today's Date: 01/10/2022 Time: SLP Start Time (ACUTE ONLY): 53 -SLP Stop Time (ACUTE ONLY): 3893 SLP Time Calculation (min) (ACUTE ONLY): 9 min Past Medical History: Past Medical History: Diagnosis Date  Alzheimer disease ([place])   Anemia   Arrhythmia   Cataract   Colon polyps   Community acquired pneumonia 01/2012  LLL  Congestive heart failure ([place])   Hyperlipidemia   Hypertension   Memory difficulty 10/01/2016  Osteoporosis   Pleural effusion, right 01/2012  SVT (supraventricular tachycardia) ([place]) 01/2012  UTI (lower urinary tract infection)  Past Surgical History: Past Surgical History: Procedure Laterality Date  [place]  COLONOSCOPY    COLONOSCOPY N/A 06/10/2016  Procedure: COLONOSCOPY;  Surgeon: Manus Gunning, MD;  Location: [facility];  Service: Gastroenterology;  Laterality: N/A;  ESOPHAGOGASTRODUODENOSCOPY N/A 06/10/2016  Procedure: ESOPHAGOGASTRODUODENOSCOPY (EGD);  Surgeon: Manus Gunning, MD;  Location: [facility];  Service: Gastroenterology;  Laterality: N/A;  HEMIARTHROPLASTY HIP  2011  Left femoral neck fracture  RIGHT/LEFT HEART CATH AND CORONARY ANGIOGRAPHY N/A 01/08/2022  Procedure: RIGHT/LEFT HEART CATH AND CORONARY ANGIOGRAPHY;  Surgeon: Leonie Man, MD;  Location: [facility];  Service: Cardiovascular;  Laterality: N/A;  UPPER GASTROINTESTINAL ENDOSCOPY   HPI: [name] Dyer is a 88 y.o. female with hx of atrial fib chronic diastolic CHF, MR, HTN, HL and admit 01/2020 with CHF who is being seen 01/09/2022 for the evaluation of CHF.  Subjective: believes her symptoms are getting worse  Recommendations for follow up therapy are one component of a multi-disciplinary discharge planning process, led by the attending physician.  Recommendations may be updated based on patient status, additional  functional criteria and insurance authorization. Assessment / Plan / Recommendation Clinical Impressions 01/10/2022 Clinical Impression [name] oropharyngeal swallowing is functional with adequate efficiency and no aspiration. She had intermittent throat clearing and coughing throughout the study despite no aspiration occurring. She did appear to have barium, including barium tablet, remaining in her esophagus upon completion of the study (MD not present to confirm). Recommend that she continue with regular solids and thin liquids, using aspiration and esophageal precautions. SLP will sign off acutely and defer any potential esophageal w/u to MD discretion. SLP Visit Diagnosis Dysphagia, pharyngoesophageal phase (R13.14) Attention and concentration deficit following -- Frontal lobe and executive function deficit following -- Impact on safety and function Mild aspiration risk   Treatment Recommendations 01/10/2022 Treatment Recommendations No treatment recommended at this time   Prognosis 01/10/2022 Prognosis for Safe Diet Advancement Good Barriers to Reach Goals -- Barriers/Prognosis Comment -- Diet Recommendations 01/10/2022 SLP Diet Recommendations Regular solids;Thin liquid Liquid Administration via Cup;Straw Medication Administration Whole meds with liquid Compensations Slow rate;Small sips/bites;Follow solids with liquid Postural Changes Remain semi-upright after  after feeds/meals (Comment);Seated upright at 90 degrees   Other Recommendations 01/10/2022 Recommended Consults Consider esophageal assessment Oral Care Recommendations Oral care BID Other Recommendations -- Follow Up Recommendations No SLP follow up Assistance recommended at discharge -- Functional Status Assessment -- No flowsheet data found.  Oral Phase 01/10/2022 Oral Phase WFL Oral - Pudding Teaspoon -- Oral - Pudding Cup -- Oral - Honey Teaspoon -- Oral - Honey Cup -- Oral - Nectar Teaspoon -- Oral - Nectar Cup -- Oral - Nectar Straw -- Oral - Thin Teaspoon -- Oral - Thin Cup -- Oral - Thin Straw -- Oral - Puree -- Oral - Mech Soft -- Oral - Regular -- Oral - Multi-Consistency -- Oral - Pill -- Oral Phase - Comment --  Pharyngeal Phase 01/10/2022 Pharyngeal Phase WFL Pharyngeal- Pudding Teaspoon -- Pharyngeal -- Pharyngeal- Pudding Cup -- Pharyngeal -- Pharyngeal- Honey Teaspoon -- Pharyngeal -- Pharyngeal- Honey Cup -- Pharyngeal -- Pharyngeal- Nectar Teaspoon -- Pharyngeal -- Pharyngeal- Nectar Cup -- Pharyngeal -- Pharyngeal- Nectar Straw -- Pharyngeal -- Pharyngeal- Thin Teaspoon -- Pharyngeal -- Pharyngeal- Thin Cup -- Pharyngeal -- Pharyngeal- Thin Straw -- Pharyngeal -- Pharyngeal- Puree -- Pharyngeal -- Pharyngeal- Mechanical Soft -- Pharyngeal -- Pharyngeal- Regular -- Pharyngeal -- Pharyngeal- Multi-consistency -- Pharyngeal -- Pharyngeal- Pill -- Pharyngeal -- Pharyngeal Comment --  Cervical Esophageal Phase  01/10/2022 Cervical Esophageal Phase WFL Pudding Teaspoon -- Pudding Cup -- Honey Teaspoon -- Honey Cup -- Nectar Teaspoon -- Nectar Cup -- Nectar Straw -- Thin Teaspoon -- Thin Cup -- Thin Straw -- Puree -- Mechanical Soft -- Regular -- Multi-consistency -- Pill -- Cervical Esophageal Comment -- Osie Bond., M.A. [facility] Pager [phone] Office [phone] 01/10/2022, 3:23 PM                     ECHOCARDIOGRAM COMPLETE  Result Date: 01/09/2022    ECHOCARDIOGRAM  REPORT   Patient Name:   EMBER HENRIKSON Date of Exam: 01/09/2022 Medical Rec #:  [id number]         Height:       64.0 in Accession #:    [id number]        Weight:       133.0 lb Date of Birth:  [birth year]         BSA:          1.645 m Patient Age:    89 years  BP:           138/66 mmHg Patient Gender: F                 HR:           93 bpm. Exam Location:  Inpatient Procedure: 2D Echo, Cardiac Doppler and Color Doppler Indications:    Dyspnea  History:        Patient has prior history of Echocardiogram examinations, most                 recent 06/04/2021. Arrythmias:Atrial Fibrillation,                 Signs/Symptoms:Dyspnea; Risk Factors:Hypertension and                 Dyslipidemia. Anemia.  Sonographer:    Clayton Lefort RDCS (AE) Referring Phys: [place]  1. Left ventricular ejection fraction, by estimation, is 50 to 55%. Left ventricular ejection fraction by PLAX is 52 %. The left ventricle has low normal function. The left ventricle has no regional wall motion abnormalities. There is mild left ventricular hypertrophy. Left ventricular diastolic function could not be evaluated. There is the interventricular septum is flattened in systole and diastole, consistent with right ventricular pressure and volume overload.  2. Right ventricular systolic function is moderately reduced. The right ventricular size is mildly enlarged. There is mildly elevated pulmonary artery systolic pressure. The estimated right ventricular systolic pressure is 16.1 mmHg.  3. Right atrial size was moderately dilated.  4. A small pericardial effusion is present. The pericardial effusion is circumferential. There is no evidence of cardiac tamponade.  5. The mitral valve is abnormal. Mild mitral valve regurgitation.  6. Tricuspid valve regurgitation is moderate.  7. The aortic valve is tricuspid. Aortic valve regurgitation is not visualized.  8. The inferior vena cava is normal in size with <50% respiratory  variability, suggesting right atrial pressure of 8 mmHg.  9. Rhythm strip during this exam demostrated atrial fibrillation. Comparison(s): Changes from prior study are noted. 06/14/2021: LVEF 55-60%, normal RV function, moderate biatrial enlargement. FINDINGS  Left Ventricle: Left ventricular ejection fraction, by estimation, is 50 to 55%. Left ventricular ejection fraction by PLAX is 52 %. The left ventricle has low normal function. The left ventricle has no regional wall motion abnormalities. The left ventricular internal cavity size was normal in size. There is mild left ventricular hypertrophy. The interventricular septum is flattened in systole and diastole, consistent with right ventricular pressure and volume overload. Left ventricular diastolic function could not be evaluated due to atrial fibrillation. Left ventricular diastolic function could not be evaluated. Right Ventricle: The right ventricular size is mildly enlarged. No increase in right ventricular wall thickness. Right ventricular systolic function is moderately reduced. There is mildly elevated pulmonary artery systolic pressure. The tricuspid regurgitant velocity is 3.04 m/s, and with an assumed right atrial pressure of 8 mmHg, the estimated right ventricular systolic pressure is 09.6 mmHg. Left Atrium: Left atrial size was normal in size. Right Atrium: Right atrial size was moderately dilated. Pericardium: A small pericardial effusion is present. The pericardial effusion is circumferential. There is no evidence of cardiac tamponade. Mitral Valve: The mitral valve is abnormal. There is mild thickening of the mitral valve leaflet(s). Mild mitral valve regurgitation, with posteriorly-directed jet. Tricuspid Valve: The tricuspid valve is grossly normal. Tricuspid valve regurgitation is moderate. Aortic Valve: The aortic valve is tricuspid. Aortic valve regurgitation is not visualized. Aortic valve mean  gradient measures 2.0 mmHg. Aortic valve peak  gradient measures 3.1 mmHg. Aortic valve area, by VTI measures 1.49 cm. Pulmonic Valve: The pulmonic valve was grossly normal. Pulmonic valve regurgitation is trivial. Aorta: The aortic root and ascending aorta are structurally normal, with no evidence of dilitation. Venous: The inferior vena cava is normal in size with less than 50% respiratory variability, suggesting right atrial pressure of 8 mmHg. IAS/Shunts: No atrial level shunt detected by color flow Doppler. EKG: Rhythm strip during this exam demostrated atrial fibrillation.  LEFT VENTRICLE PLAX 2D LV EF:         Left ventricular ejection fraction by PLAX is 52 %. LVIDd:         3.80 cm LVIDs:         2.80 cm LV PW:         1.10 cm LV IVS:        0.90 cm LVOT diam:     1.60 cm LV SV:         24 LV SV Index:   15 LVOT Area:     2.01 cm  RIGHT VENTRICLE            IVC RV Basal diam:  3.20 cm    IVC diam: 1.60 cm RV S prime:     5.19 cm/s TAPSE (M-mode): 1.0 cm LEFT ATRIUM           Index        RIGHT ATRIUM           Index LA diam:      4.00 cm 2.43 cm/m   RA Area:     23.20 cm LA Vol (A4C): 36.7 ml 22.31 ml/m  RA Volume:   72.30 ml  43.95 ml/m  AORTIC VALVE AV Area (Vmax):    1.49 cm AV Area (Vmean):   1.52 cm AV Area (VTI):     1.49 cm AV Vmax:           87.80 cm/s AV Vmean:          59.000 cm/s AV VTI:            0.163 m AV Peak Grad:      3.1 mmHg AV Mean Grad:      2.0 mmHg LVOT Vmax:         64.90 cm/s LVOT Vmean:        44.700 cm/s LVOT VTI:          0.121 m LVOT/AV VTI ratio: 0.74  AORTA Ao Root diam: 3.20 cm Ao Asc diam:  3.00 cm TRICUSPID VALVE TR Peak grad:   37.0 mmHg TR Vmax:        304.00 cm/s  SHUNTS Systemic VTI:  0.12 m Systemic Diam: 1.60 cm Lyman Bishop MD Electronically signed by Lyman Bishop MD Signature Date/Time: 01/09/2022/5:33:36 PM    Final     Cardiac Studies   Right/Left Cardiac Catheterization 01/08/2022:   Mid LAD lesion is 55% stenosed.   Prox Cx lesion is 30% stenosed.   Prox RCA lesion is 45% stenosed.    Hemodynamic findings consistent with severe pulmonary hypertension.(See Below)   POST-OPERATIVE DIAGNOSIS:   Moderate two-vessel CAD with ~55% mid LAD disease and 45 to 50% proximal RCA disease otherwise minimal disease in the rest of the coronary arteries Severe Combined Pulmonary Hypertension -mostly related to Acute on ChronicDiastolic Heart Failure, clearly with underlying pulmonary disease as well: PAP 68/30 mmHg with mean of 49 mmHg; PCWP is  29 to 30 mmHg, TPG 20            LV P-EDP 120/16 mmHg - 26 mmHg.            RAP 22 mmHg, RVP-EDP 72/15 mmHg - 26 mmHg;             Ao P8-MAP 132/66 mmHg - 94 mmHg            Moderate his Cardiac Function: Cardiac Output-Index 3.69-2.25.            Permanent atrial fibrillation   PLAN OF CARE:  Admit to inpatient   Given her advanced age, borderline renal function, and significant pulmonary hypertension both combined pulmonary and secondary diastolic LV failure with elevated filling pressures, reasonable to admit to the hospital to allow for IV diuresis and possible consideration of milrinone inotropic support.->  I have written for IV Lasix 40 MG bid, however I suspect she may need inotropic assistance.  With setting of atrial fibrillation, would probably recommend milrinone. Restart Xarelto tonight  Diagnostic Dominance: Right  _______________  Echocardiogram 01/09/2022: Impressions: 1. Left ventricular ejection fraction, by estimation, is 50 to 55%. Left  ventricular ejection fraction by PLAX is 52 %. The left ventricle has low  normal function. The left ventricle has no regional wall motion  abnormalities. There is mild left  ventricular hypertrophy. Left ventricular diastolic function could not be  evaluated. There is the interventricular septum is flattened in systole  and diastole, consistent with right ventricular pressure and volume  overload.   2. Right ventricular systolic function is moderately reduced. The right   ventricular size is mildly enlarged. There is mildly elevated pulmonary  artery systolic pressure. The estimated right ventricular systolic  pressure is 40.1 mmHg.   3. Right atrial size was moderately dilated.   4. A small pericardial effusion is present. The pericardial effusion is  circumferential. There is no evidence of cardiac tamponade.   5. The mitral valve is abnormal. Mild mitral valve regurgitation.   6. Tricuspid valve regurgitation is moderate.   7. The aortic valve is tricuspid. Aortic valve regurgitation is not  visualized.   8. The inferior vena cava is normal in size with <50% respiratory  variability, suggesting right atrial pressure of 8 mmHg.   9. Rhythm strip during this exam demostrated atrial fibrillation.   Comparison(s): Changes from prior study are noted. 06/14/2021: LVEF 55-60%,  normal RV function, moderate biatrial enlargement.   Patient Profile     88 y.o. female with a history of chronic diastolic CHF, permanent atrial fibrillation on Xarelto, paroxysmal SVT, hypertension, hyperlipidemia, CKD stage III, COPD, ILD and Alzheimer disease who presented for planned outpatient right/left cardiac catheterization on 01/08/2022. Cath showed elevated filling pressures with cardiac output of 3.69 and cardiac index of 2.25. She was addmitted for IV diuresis for acute on chronic diastolic CHF.  Assessment & Plan    #Gout flare: she has a gout flare with diureses noted 1/14. Will treat with 1.2 mg and 0.6 thereafter. Ok with crt clearance. - start 1.2  mg colchicine x1 - start 0.6 mg colchicine after one hour and continue daily until flare improves  #Acute on Chronic Diastolic CHF  R/LHC on 0/27/2536 showed non-obstructive CAD with severe combined pulmonary hypertension mostly due to acute on chronic diastolic CHF. LVEDP was elevated at 102mHg. PCWP was 29-30 mmHg. Cardiac output 3.69 and cardiac index 2.25. TPG 20. Pulmonary HTN Group II,  Group III (COPD and ILD). Echo  showed LVEF of 50-55% with normal wall motion and mild LVH. Interventricular septum flattened in systole and diastole consistent with RV pressure and volume overload. RV mildly enlarged with moderately reduced systolic function and mildly elevated PASP of 45.0 mmHg. She was initially started IV Lasix 66m twice daily and then increased to 848mtwice daily. Responding well to diuresis. Net negative 6.2 L this admission. - Patient still has evidence of volume overload but improved; she was lying flat - will continue current dose of IV Lasix 8084mwice daily today - Home Atenolol was decreased to 54m72mice daily given decreased cardiac output and cardiac index. - Monitor daily weights, strict I/Os, and renal function. -Called left message with son  #Non-Obstructive CAD Noted on cath this admission. - No angina. - No Aspirin due to need for DOAC. - Continue beta-blocker and statin.  #Permanent Atrial Fibrillation Rate controlled. Rates mostly in the 70s to 90s. - Continue Atenolol 54mg83mce daily. - Continue Xarelto 15mg 23my.  #Hypertension BP well controlled. - Continue Atenolol as above.  #Hyperlipidemia Lipid panel in 11/2021: Total Cholesterol 123, Triglycerides 79, HDL 40, LDL 67. - Continue Simvastatin 10mg d22m.  #CKD Stage III Recent baseline around 1.4 to 1.7. - at baseline, stable  #Alzheimer's Disease Continue home Aricept and Namenda.  For questions or updates, please contact [facility] consult [email or web address] for contact info under        Signed, Alfrieda Tarry,Janina Mayo/14/2023, 10:28 AM

## 2022-01-11 NOTE — Progress Notes (Signed)
Mobility Specialist Progress Note    01/11/22 1146  Mobility  Activity Ambulated in room  Level of Assistance Minimal assist, patient does 75% or more  Assistive Device Front wheel walker  Distance Ambulated (ft) 20 ft  Mobility Ambulated with assistance in room  Mobility Response Tolerated fair  Mobility performed by Mobility specialist  Bed Position Chair  $Mobility charge 1 Mobility   Pre-Mobility: 91 HR, 138/68 BP  Pt received in bed and agreeable to get up to chair. C/o foot pain. Left in chair with call bell in reach and RN notified.   [facility] Mobility Specialist  M.S. 2C and 6E: [phone] M.S. 4E: [phone]

## 2022-01-12 LAB — BASIC METABOLIC PANEL
Anion gap: 13 (ref 5–15)
BUN: 41 mg/dL — ABNORMAL HIGH (ref 8–23)
CO2: 32 mmol/L (ref 22–32)
Calcium: 8.8 mg/dL — ABNORMAL LOW (ref 8.9–10.3)
Chloride: 94 mmol/L — ABNORMAL LOW (ref 98–111)
Creatinine, Ser: 1.47 mg/dL — ABNORMAL HIGH (ref 0.44–1.00)
GFR, Estimated: 34 mL/min — ABNORMAL LOW (ref >60)
Glucose, Bld: 106 mg/dL — ABNORMAL HIGH (ref 70–99)
Potassium: 3.1 mmol/L — ABNORMAL LOW (ref 3.5–5.1)
Sodium: 139 mmol/L (ref 135–145)

## 2022-01-12 MED ORDER — POTASSIUM CHLORIDE CRYS ER 20 MEQ PO TBCR
40.0000 meq | EXTENDED_RELEASE_TABLET | ORAL | Status: AC
  Administered 2022-01-12 (×2): 40 meq via ORAL
  Filled 2022-01-12 (×2): qty 2

## 2022-01-12 MED ORDER — FUROSEMIDE 10 MG/ML IJ SOLN
80.0000 mg | Freq: Once | INTRAMUSCULAR | Status: AC
  Administered 2022-01-12: 80 mg via INTRAVENOUS
  Filled 2022-01-12: qty 8

## 2022-01-12 NOTE — Progress Notes (Signed)
Progress Note  Patient Name: [name] Dyer Date of Encounter: 01/12/2022  [facility] Cardiologist: [name], MD   Subjective   She is laying flat. Looks comfortable. Challenging to engage in conversation  Normal blood pressures Rate controlled afib RA Crt 1.39->1.58->1.47 on 80 mg IV BID on 80 IV BID, net negative 2.1 L. Total net negative 8.3 L  Wt Readings from Last 3 Encounters:  01/12/22 60.7 kg  12/25/21 63 kg  12/17/21 61 kg     Inpatient Medications    Scheduled Meds:  vitamin C  250 mg Oral Daily   atenolol  50 mg Oral BID   calcium carbonate  1,250 mg Oral q AM   colchicine  0.6 mg Oral Daily   donepezil  10 mg Oral QHS   furosemide  80 mg Intravenous BID   gabapentin  300 mg Oral TID   melatonin  10 mg Oral QHS   memantine  5 mg Oral BID   rivaroxaban  15 mg Oral Q supper   simvastatin  10 mg Oral Daily   sodium chloride flush  3 mL Intravenous Q12H   vitamin B-12  1,000 mcg Oral Daily   Continuous Infusions:  sodium chloride     PRN Meds: sodium chloride, acetaminophen, acetaminophen, ipratropium, Muscle Rub, ondansetron (ZOFRAN) IV, sodium chloride flush, traZODone   Vital Signs    Vitals:   01/11/22 1656 01/11/22 1900 01/11/22 2000 01/12/22 0525  BP: 126/84 138/62 114/64 138/66  Pulse: 91  95 82  Resp: 20   19  Temp: 98.3 F (36.8 C)   (!) 97.5 F (36.4 C)  TempSrc: Oral   Oral  SpO2: 98%  (!) 89% 94%  Weight:    60.7 kg  Height:        Intake/Output Summary (Last 24 hours) at 01/12/2022 0827 Last data filed at 01/12/2022 0527 Gross per 24 hour  Intake 240 ml  Output 2375 ml  Net -2135 ml   Last 3 Weights 01/12/2022 01/11/2022 01/10/2022  Weight (lbs) 133 lb 13.1 oz 136 lb 7.4 oz 145 lb 15.1 oz  Weight (kg) 60.7 kg 61.9 kg 66.2 kg      Telemetry    Atrial fibrillation rate controlled. - Personally Reviewed  ECG    No ECG tracing this admission. - Personally Reviewed  Physical Exam   Physical Exam Gen: elderly,  no distress Neuro: alert and oriented CV: irregular rhythm, no murmurs. JVD improved Vasc: 2+ radial pulses Pulm: normal wob, decreased coarse BS in the bases Abd: non distended Ext: No LE edema.  Skin: warm and well perfused Psych: normal mood   Labs    High Sensitivity Troponin:  No results for input(s): TROPONINIHS in the last 720 hours.   Chemistry Recent Labs  Lab 01/09/22 0253 01/10/22 1100 01/11/22 0254 01/12/22 0201  NA 138  --  142 139  K 3.9  --  3.1* 3.1*  CL 108  --  96* 94*  CO2 22  --  34* 32  GLUCOSE 89  --  108* 106*  BUN 37*  --  37* 41*  CREATININE 1.39*  --  1.58* 1.47*  CALCIUM 8.2*  --  8.8* 8.8*  MG  --  1.9  --   --   PROT 5.6*  --   --   --   ALBUMIN 3.1*  --   --   --   AST 26  --   --   --  ALT 21  --   --   --   ALKPHOS 99  --   --   --   BILITOT 1.3*  --   --   --   GFRNONAA 37*  --  31* 34*  ANIONGAP 8  --  12 13    Lipids No results for input(s): CHOL, TRIG, HDL, LABVLDL, LDLCALC, CHOLHDL in the last 168 hours.  Hematology Recent Labs  Lab 01/08/22 1313 01/08/22 1319 01/08/22 1320  HGB 12.9 12.9 13.3  HCT 38.0 38.0 39.0   Thyroid No results for input(s): TSH, FREET4 in the last 168 hours.  BNPNo results for input(s): BNP, PROBNP in the last 168 hours.  DDimer No results for input(s): DDIMER in the last 168 hours.   Radiology    DG Swallowing Func-Speech Pathology  Result Date: 01/10/2022 Table formatting from the original result was not included. Objective Swallowing Evaluation: Type of Study: MBS-Modified Barium Swallow Study  Patient Details Name: [name] Dyer MRN: [id number] Date of Birth: 1932/12/11 Today's Date: 01/10/2022 Time: SLP Start Time (ACUTE ONLY): 58 -SLP Stop Time (ACUTE ONLY): 7680 SLP Time Calculation (min) (ACUTE ONLY): 9 min Past Medical History: Past Medical History: Diagnosis Date  Alzheimer disease ([place])   Anemia   Arrhythmia   Cataract   Colon polyps   Community acquired pneumonia 01/2012  LLL   Congestive heart failure ([place])   Hyperlipidemia   Hypertension   Memory difficulty 10/01/2016  Osteoporosis   Pleural effusion, right 01/2012  SVT (supraventricular tachycardia) ([place]) 01/2012  UTI (lower urinary tract infection)  Past Surgical History: Past Surgical History: Procedure Laterality Date  [place]  COLONOSCOPY    COLONOSCOPY N/A 06/10/2016  Procedure: COLONOSCOPY;  Surgeon: Manus Gunning, MD;  Location: [facility];  Service: Gastroenterology;  Laterality: N/A;  ESOPHAGOGASTRODUODENOSCOPY N/A 06/10/2016  Procedure: ESOPHAGOGASTRODUODENOSCOPY (EGD);  Surgeon: Manus Gunning, MD;  Location: [facility];  Service: Gastroenterology;  Laterality: N/A;  HEMIARTHROPLASTY HIP  2011  Left femoral neck fracture  RIGHT/LEFT HEART CATH AND CORONARY ANGIOGRAPHY N/A 01/08/2022  Procedure: RIGHT/LEFT HEART CATH AND CORONARY ANGIOGRAPHY;  Surgeon: Leonie Man, MD;  Location: [facility];  Service: Cardiovascular;  Laterality: N/A;  UPPER GASTROINTESTINAL ENDOSCOPY   HPI: [name] Dyer is a 88 y.o. female with hx of atrial fib chronic diastolic CHF, MR, HTN, HL and admit 01/2020 with CHF who is being seen 01/09/2022 for the evaluation of CHF.  Subjective: believes her symptoms are getting worse  Recommendations for follow up therapy are one component of a multi-disciplinary discharge planning process, led by the attending physician.  Recommendations may be updated based on patient status, additional functional criteria and insurance authorization. Assessment / Plan / Recommendation Clinical Impressions 01/10/2022 Clinical Impression [name] oropharyngeal swallowing is functional with adequate efficiency and no aspiration. She had intermittent throat clearing and coughing throughout the study despite no aspiration occurring. She did appear to have barium, including barium tablet, remaining in her esophagus upon completion of the study (MD not present to  confirm). Recommend that she continue with regular solids and thin liquids, using aspiration and esophageal precautions. SLP will sign off acutely and defer any potential esophageal w/u to MD discretion. SLP Visit Diagnosis Dysphagia, pharyngoesophageal phase (R13.14) Attention and concentration deficit following -- Frontal lobe and executive function deficit following -- Impact on safety and function Mild aspiration risk   Treatment Recommendations 01/10/2022 Treatment Recommendations No treatment recommended at this time  Prognosis 01/10/2022 Prognosis for Safe Diet Advancement Good Barriers to Reach Goals -- Barriers/Prognosis Comment -- Diet Recommendations 01/10/2022 SLP Diet Recommendations Regular solids;Thin liquid Liquid Administration via Cup;Straw Medication Administration Whole meds with liquid Compensations Slow rate;Small sips/bites;Follow solids with liquid Postural Changes Remain semi-upright after after feeds/meals (Comment);Seated upright at 90 degrees   Other Recommendations 01/10/2022 Recommended Consults Consider esophageal assessment Oral Care Recommendations Oral care BID Other Recommendations -- Follow Up Recommendations No SLP follow up Assistance recommended at discharge -- Functional Status Assessment -- No flowsheet data found.  Oral Phase 01/10/2022 Oral Phase WFL Oral - Pudding Teaspoon -- Oral - Pudding Cup -- Oral - Honey Teaspoon -- Oral - Honey Cup -- Oral - Nectar Teaspoon -- Oral - Nectar Cup -- Oral - Nectar Straw -- Oral - Thin Teaspoon -- Oral - Thin Cup -- Oral - Thin Straw -- Oral - Puree -- Oral - Mech Soft -- Oral - Regular -- Oral - Multi-Consistency -- Oral - Pill -- Oral Phase - Comment --  Pharyngeal Phase 01/10/2022 Pharyngeal Phase WFL Pharyngeal- Pudding Teaspoon -- Pharyngeal -- Pharyngeal- Pudding Cup -- Pharyngeal -- Pharyngeal- Honey Teaspoon -- Pharyngeal -- Pharyngeal- Honey Cup -- Pharyngeal -- Pharyngeal- Nectar Teaspoon -- Pharyngeal -- Pharyngeal- Nectar Cup --  Pharyngeal -- Pharyngeal- Nectar Straw -- Pharyngeal -- Pharyngeal- Thin Teaspoon -- Pharyngeal -- Pharyngeal- Thin Cup -- Pharyngeal -- Pharyngeal- Thin Straw -- Pharyngeal -- Pharyngeal- Puree -- Pharyngeal -- Pharyngeal- Mechanical Soft -- Pharyngeal -- Pharyngeal- Regular -- Pharyngeal -- Pharyngeal- Multi-consistency -- Pharyngeal -- Pharyngeal- Pill -- Pharyngeal -- Pharyngeal Comment --  Cervical Esophageal Phase  01/10/2022 Cervical Esophageal Phase WFL Pudding Teaspoon -- Pudding Cup -- Honey Teaspoon -- Honey Cup -- Nectar Teaspoon -- Nectar Cup -- Nectar Straw -- Thin Teaspoon -- Thin Cup -- Thin Straw -- Puree -- Mechanical Soft -- Regular -- Multi-consistency -- Pill -- Cervical Esophageal Comment -- Osie Bond., M.A. CCC-SLP Acute Rehabilitation Services Pager [phone] Office [phone] 01/10/2022, 3:23 PM                      Cardiac Studies   Right/Left Cardiac Catheterization 01/08/2022:   Mid LAD lesion is 55% stenosed.   Prox Cx lesion is 30% stenosed.   Prox RCA lesion is 45% stenosed.   Hemodynamic findings consistent with severe pulmonary hypertension.(See Below)   POST-OPERATIVE DIAGNOSIS:   Moderate two-vessel CAD with ~55% mid LAD disease and 45 to 50% proximal RCA disease otherwise minimal disease in the rest of the coronary arteries Severe Combined Pulmonary Hypertension -mostly related to Acute on ChronicDiastolic Heart Failure, clearly with underlying pulmonary disease as well: PAP 68/30 mmHg with mean of 49 mmHg; PCWP is 29 to 30 mmHg, TPG 20            LV P-EDP 120/16 mmHg - 26 mmHg.            RAP 22 mmHg, RVP-EDP 72/15 mmHg - 26 mmHg;             Ao P8-MAP 132/66 mmHg - 94 mmHg            Moderate his Cardiac Function: Cardiac Output-Index 3.69-2.25.            Permanent atrial fibrillation   PLAN OF CARE:  Admit to inpatient   Given her advanced age, borderline renal function, and significant pulmonary hypertension both combined pulmonary and secondary  diastolic LV failure with elevated filling pressures, reasonable to admit to the  hospital to allow for IV diuresis and possible consideration of milrinone inotropic support.->  I have written for IV Lasix 40 MG bid, however I suspect she may need inotropic assistance.  With setting of atrial fibrillation, would probably recommend milrinone. Restart Xarelto tonight  Diagnostic Dominance: Right  _______________  Echocardiogram 01/09/2022: Impressions: 1. Left ventricular ejection fraction, by estimation, is 50 to 55%. Left  ventricular ejection fraction by PLAX is 52 %. The left ventricle has low  normal function. The left ventricle has no regional wall motion  abnormalities. There is mild left  ventricular hypertrophy. Left ventricular diastolic function could not be  evaluated. There is the interventricular septum is flattened in systole  and diastole, consistent with right ventricular pressure and volume  overload.   2. Right ventricular systolic function is moderately reduced. The right  ventricular size is mildly enlarged. There is mildly elevated pulmonary  artery systolic pressure. The estimated right ventricular systolic  pressure is 95.6 mmHg.   3. Right atrial size was moderately dilated.   4. A small pericardial effusion is present. The pericardial effusion is  circumferential. There is no evidence of cardiac tamponade.   5. The mitral valve is abnormal. Mild mitral valve regurgitation.   6. Tricuspid valve regurgitation is moderate.   7. The aortic valve is tricuspid. Aortic valve regurgitation is not  visualized.   8. The inferior vena cava is normal in size with <50% respiratory  variability, suggesting right atrial pressure of 8 mmHg.   9. Rhythm strip during this exam demostrated atrial fibrillation.   Comparison(s): Changes from prior study are noted. 06/14/2021: LVEF 55-60%,  normal RV function, moderate biatrial enlargement.   Patient Profile     88 y.o. female  with a history of chronic diastolic CHF, permanent atrial fibrillation on Xarelto, paroxysmal SVT, hypertension, hyperlipidemia, CKD stage III, COPD, ILD and Alzheimer disease who presented for planned outpatient right/left cardiac catheterization on 01/08/2022. Cath showed elevated filling pressures with cardiac output of 3.69 and cardiac index of 2.25. She was addmitted for IV diuresis for acute on chronic diastolic CHF and pulmonary hypertension  Assessment & Plan     #Acute on Chronic Diastolic CHF  R/LHC on 3/87/5643 showed non-obstructive CAD with severe combined pulmonary hypertension mostly due to acute on chronic diastolic CHF. LVEDP was elevated at 68mHg. PCWP was 29-30 mmHg. Cardiac output 3.69 and cardiac index 2.25. TPG 20. Pulmonary HTN Group II,  Group III (COPD and ILD). Echo showed LVEF of 50-55% with normal wall motion and mild LVH. Interventricular septum flattened in systole and diastole consistent with RV pressure and volume overload. RV mildly enlarged with moderately reduced systolic function and mildly elevated PASP of 45.0 mmHg. She was initially started IV Lasix 441mtwice daily and then increased to 8051mwice daily.  - she is satting well, laying flat, JVD improved - decreasing frequency of lasix to 80 mg IV once daily from BID today, can likely transition to oral tomorrow or Tue - Home Atenolol was decreased to 68m56mice daily given decreased cardiac output and cardiac index; blood pressure is tolerating this well. She is normotensive and warm - Monitor daily weights, strict I/Os, and renal function. -Called left message with son 1/14  #Non-Obstructive CAD Noted on cath this admission. - No angina. - No Aspirin due to need for DOAC. - Continue beta-blocker and statin.  #Permanent Atrial Fibrillation Rate controlled. Rates mostly in the 70s to 90s. - Continue Atenolol 68mg46mce daily. - Continue Xarelto  26m daily.  #Hypertension BP well controlled. - Continue  Atenolol as above.  #Hyperlipidemia Lipid panel in 11/2021: Total Cholesterol 123, Triglycerides 79, HDL 40, LDL 67. - Continue Simvastatin 172mdaily.  #CKD Stage III Recent baseline around 1.4 to 1.7. - at baseline, stable  #Gout flare: she has a gout flare with diureses noted 1/14. Will treat with 1.2 mg and 0.6 thereafter. Ok with crt clearance. - s/p 1.2  mg colchicine x1 - cont 0.6 mg colchicine after one hour and continue daily until flare improves  #Alzheimer's Disease Continue home Aricept and Namenda.  Discharge planning: can likely transition to oral diuretic soon and dispo early next week. Will need assistance with social work and her son MiLegrand ComoShe's a patient of Dr. RoHarrington Challengeror many years, can continue to follow up with her.  For questions or updates, please contact CHOwenlease consult [email or web address] for contact info under        Signed, BrJanina MayoMD  01/12/2022, 8:27 AM

## 2022-01-12 NOTE — Progress Notes (Signed)
Mobility Specialist Progress Note    01/12/22 1655  Mobility  Activity Ambulated in hall  Level of Assistance Minimal assist, patient does 75% or more  Assistive Device Front wheel walker  Distance Ambulated (ft) 80 ft  Mobility Ambulated with assistance in hallway  Mobility Response Tolerated fair  Mobility performed by Mobility specialist  Bed Position Chair  $Mobility charge 1 Mobility   Pt received in bed and agreeable. Needing cueing for gait. Pt desatted into low 80s on RA with less reliable pleth, RN notified. Pt returned to chair with call bell in reach and RN present.   [facility] Mobility Specialist  M.S. 2C and 6E: [phone] M.S. 4E: [phone]

## 2022-01-13 LAB — BASIC METABOLIC PANEL
Anion gap: 12 (ref 5–15)
BUN: 44 mg/dL — ABNORMAL HIGH (ref 8–23)
CO2: 33 mmol/L — ABNORMAL HIGH (ref 22–32)
Calcium: 9 mg/dL (ref 8.9–10.3)
Chloride: 95 mmol/L — ABNORMAL LOW (ref 98–111)
Creatinine, Ser: 1.31 mg/dL — ABNORMAL HIGH (ref 0.44–1.00)
GFR, Estimated: 39 mL/min — ABNORMAL LOW (ref >60)
Glucose, Bld: 98 mg/dL (ref 70–99)
Potassium: 3.7 mmol/L (ref 3.5–5.1)
Sodium: 140 mmol/L (ref 135–145)

## 2022-01-13 LAB — URIC ACID: Uric Acid, Serum: 11.7 mg/dL — ABNORMAL HIGH (ref 2.5–7.1)

## 2022-01-13 MED ORDER — FUROSEMIDE 40 MG PO TABS
60.0000 mg | ORAL_TABLET | Freq: Every day | ORAL | Status: DC
  Administered 2022-01-13 – 2022-01-17 (×5): 60 mg via ORAL
  Filled 2022-01-13 (×5): qty 1

## 2022-01-13 MED ORDER — POTASSIUM CHLORIDE CRYS ER 10 MEQ PO TBCR
10.0000 meq | EXTENDED_RELEASE_TABLET | Freq: Every day | ORAL | Status: DC
  Administered 2022-01-13 – 2022-01-15 (×3): 10 meq via ORAL
  Filled 2022-01-13 (×4): qty 1

## 2022-01-13 NOTE — Care Management Important Message (Signed)
Important Message  Patient Details  Name: [name] Dyer MRN: [id number] Date of Birth: 01-19-1932   Medicare Important Message Given:  Yes     [name] [name] 01/13/2022, 3:49 PM

## 2022-01-13 NOTE — Evaluation (Signed)
Physical Therapy Evaluation Patient Details Name: [name] Dyer MRN: [id number] DOB: February 04, 1932 Today's Date: 01/13/2022  History of Present Illness  Patient is a 88 y/o female who presents on 01/08/22 with SOB and LE edema. Admitted with acute on chronic diastolic CHF. PMH includes A-fib, CHF, HTN, dementia.  Clinical Impression  Patient presents with pain, generalized weakness and impaired mobility s/p above. Pt lives alone and is Mod I for ADLs/ambulation PTA. Has 2 sons who stay with her for most of the day until bedtime. Pt is only alone in the morning and at night. Today, pt requires Mod A to stand from chair and Min guard assist with use of RW for support during ambulation. Reports mild SOB at rest, not noted during session. VSS on RA. Pt plans to d/c home with more support from sons who state can stay the night for the first few days. Discussed placing BSC near bed at night time to prevent falls. Requires assist to stand but uses lift chair at home. Will follow acutely to maximize independence and mobility prior to return home.        Recommendations for follow up therapy are one component of a multi-disciplinary discharge planning process, led by the attending physician.  Recommendations may be updated based on patient status, additional functional criteria and insurance authorization.  Follow Up Recommendations Home health PT    Assistance Recommended at Discharge Frequent or constant Supervision/Assistance  Patient can return home with the following  A little help with walking and/or transfers;A little help with bathing/dressing/bathroom;Assistance with cooking/housework;Direct supervision/assist for financial management;Assist for transportation    Equipment Recommendations None recommended by PT  Recommendations for Other Services       Functional Status Assessment Patient has had a recent decline in their functional status and demonstrates the ability to make significant  improvements in function in a reasonable and predictable amount of time.     Precautions / Restrictions Precautions Precautions: Fall;Other (comment) Precaution Comments: watch 02 Restrictions Weight Bearing Restrictions: No      Mobility  Bed Mobility               General bed mobility comments: Sitting in chair upon PT arrival.    Transfers Overall transfer level: Needs assistance Equipment used: Rolling walker (2 wheels) Transfers: Sit to/from Stand Sit to Stand: Mod assist           General transfer comment: Assist to power to standing with cues for hand placement/technique, stood from chair x3.    Ambulation/Gait Ambulation/Gait assistance: Min guard Gait Distance (Feet): 130 Feet Assistive device: Rolling walker (2 wheels) Gait Pattern/deviations: Step-through pattern;Decreased stride length;Shuffle Gait velocity: .5 ft/sec Gait velocity interpretation: <1.8 ft/sec, indicate of risk for recurrent falls   General Gait Details: Very slow, shuffling gait with decreased foot clearance bilaterally. Veers right (might be RW).  Stairs            Wheelchair Mobility    Modified Rankin (Stroke Patients Only)       Balance Overall balance assessment: Needs assistance Sitting-balance support: Feet supported;No upper extremity supported Sitting balance-Leahy Scale: Fair     Standing balance support: During functional activity;Reliant on assistive device for balance Standing balance-Leahy Scale: Poor                               Pertinent Vitals/Pain Pain Assessment: Faces Faces Pain Scale: Hurts little more Pain Location: bil feet, bil hands  Pain Descriptors / Indicators: Sore Pain Intervention(s): Monitored during session;Limited activity within patient's tolerance;Repositioned    Home Living Family/patient expects to be discharged to:: Private residence Living Arrangements: Alone Available Help at Discharge: Family;Available  PRN/intermittently Type of Home: House Home Access: Ramped entrance       Home Layout: One level Home Equipment: Cane - single point;Grab bars - tub/shower;Hand held shower head;Rolling Walker (2 wheels);Rollator (4 wheels);BSC/3in1 Additional Comments: Sons are with her in the day and before bedtime otherwise pt is home alone in AM and at night. HAs a lift chair    Prior Function Prior Level of Function : Needs assist             Mobility Comments: Uses RW for ambulation, furniture walker ADLs Comments: does own ADLs, fam assists with meals, does not drive.     Hand Dominance   Dominant Hand: Right    Extremity/Trunk Assessment   Upper Extremity Assessment Upper Extremity Assessment: Defer to OT evaluation    Lower Extremity Assessment Lower Extremity Assessment: RLE deficits/detail;LLE deficits/detail RLE Sensation: history of peripheral neuropathy (distal from thigh) LLE Sensation: history of peripheral neuropathy (distal from thigh)       Communication   Communication: No difficulties  Cognition Arousal/Alertness: Awake/alert Behavior During Therapy: WFL for tasks assessed/performed Overall Cognitive Status: History of cognitive impairments - at baseline                                 General Comments: Per son, more awake today, has been lethargic. Requires repetition at times to follow commands. Son reports close to baseline. Talking unrelated to topics at times.        General Comments General comments (skin integrity, edema, etc.): Son present during session. Discussed placing BSC near bed at night time as well as having someone stay with her initially for safety.    Exercises     Assessment/Plan    PT Assessment Patient needs continued PT services  PT Problem List Decreased strength;Decreased mobility;Pain;Impaired sensation;Decreased activity tolerance;Decreased cognition;Cardiopulmonary status limiting activity       PT Treatment  Interventions Therapeutic exercise;Gait training;Balance training;Patient/family education;Therapeutic activities;Functional mobility training    PT Goals (Current goals can be found in the Care Plan section)  Acute Rehab PT Goals Patient Stated Goal: to go home PT Goal Formulation: With patient Time For Goal Achievement: 01/27/22 Potential to Achieve Goals: Good    Frequency Min 3X/week     Co-evaluation               AM-PAC PT "6 Clicks" Mobility  Outcome Measure Help needed turning from your back to your side while in a flat bed without using bedrails?: A Little Help needed moving from lying on your back to sitting on the side of a flat bed without using bedrails?: A Little Help needed moving to and from a bed to a chair (including a wheelchair)?: A Lot Help needed standing up from a chair using your arms (e.g., wheelchair or bedside chair)?: A Lot Help needed to walk in hospital room?: A Little Help needed climbing 3-5 steps with a railing? : A Little 6 Click Score: 16    End of Session Equipment Utilized During Treatment: Gait belt Activity Tolerance: Patient tolerated treatment well Patient left: in chair;with call bell/phone within reach;with chair alarm set;with family/visitor present Nurse Communication: Mobility status PT Visit Diagnosis: Pain;Muscle weakness (generalized) (M62.81);Difficulty in walking, not elsewhere  classified (R26.2) Pain - part of body: Ankle and joints of foot    Time: 1336-1419 PT Time Calculation (min) (ACUTE ONLY): 43 min   Charges:   PT Evaluation $PT Eval Moderate Complexity: 1 Mod PT Treatments $Gait Training: 8-22 mins $Therapeutic Activity: 8-22 mins        Marisa Severin, PT, DPT Acute Rehabilitation Services Pager [phone] Office [place] 01/13/2022, 3:40 PM

## 2022-01-13 NOTE — Care Management Important Message (Signed)
Important Message  Patient Details  Name: [name] Dyer MRN: [id number] Date of Birth: 12/01/1932   Medicare Important Message Given:  Yes     [name] [name] 01/13/2022, 3:41 PM

## 2022-01-13 NOTE — Progress Notes (Addendum)
Progress Note  Patient Name: [name] Dyer Date of Encounter: 01/13/2022  [facility] Cardiologist: [name], MD   Subjective   Pt laying flat im bed   Appears fairly comfortable  Says her breathing is better than on admit   Denies CP    Wt Readings from Last 3 Encounters:  01/13/22 58.8 kg  12/25/21 63 kg  12/17/21 61 kg     Inpatient Medications    Scheduled Meds:  vitamin C  250 mg Oral Daily   atenolol  50 mg Oral BID   calcium carbonate  1,250 mg Oral q AM   colchicine  0.6 mg Oral Daily   donepezil  10 mg Oral QHS   gabapentin  300 mg Oral TID   melatonin  10 mg Oral QHS   memantine  5 mg Oral BID   rivaroxaban  15 mg Oral Q supper   simvastatin  10 mg Oral Daily   sodium chloride flush  3 mL Intravenous Q12H   vitamin B-12  1,000 mcg Oral Daily   Continuous Infusions:  sodium chloride     PRN Meds: sodium chloride, acetaminophen, acetaminophen, ipratropium, Muscle Rub, ondansetron (ZOFRAN) IV, sodium chloride flush, traZODone   Vital Signs    Vitals:   01/12/22 1010 01/12/22 1612 01/12/22 2040 01/13/22 0624  BP: (!) 145/86 135/66 140/79 137/75  Pulse: 82 93 96 83  Resp: _0 Temp: 97.8 F (36.6 C) 97.8 F (36.6 C) 98.5 F (36.9 C) 97.8 F (36.6 C)  TempSrc: Oral Oral Oral Oral  SpO2: 100% 90% 93% 93%  Weight:    58.8 kg  Height:        Intake/Output Summary (Last 24 hours) at 01/13/2022 0742 Last data filed at 01/13/2022 1191 Gross per 24 hour  Intake --  Output 1850 ml  Net -1850 ml   Net neg 10 L   Last 3 Weights 01/13/2022 01/12/2022 01/11/2022  Weight (lbs) 129 lb 10.1 oz 133 lb 13.1 oz 136 lb 7.4 oz  Weight (kg) 58.8 kg 60.7 kg 61.9 kg      Telemetry    Atrial fibrillation  Rates 80s  - Personally Reviewed  ECG    No ECG tracing this admission. - Personally Reviewed  Physical Exam   Physical Exam Gen: elderly, no distress Neuro: alert and oriented CV: irregular rhythm, no murmurs  JVP is normal    Vasc:  2+ radial pulses Pulm: RElatively clear  Abd: non distended Ext: No LE edema.  Skin: warm and well perfused Psych:  calm   Labs    High Sensitivity Troponin:  No results for input(s): TROPONINIHS in the last 720 hours.   Chemistry Recent Labs  Lab 01/09/22 0253 01/10/22 1100 01/11/22 0254 01/12/22 0201 01/13/22 0154  NA 138  --  142 139 140  K 3.9  --  3.1* 3.1* 3.7  CL 108  --  96* 94* 95*  CO2 22  --  34* 32 33*  GLUCOSE 89  --  108* 106* 98  BUN 37*  --  37* 41* 44*  CREATININE 1.39*  --  1.58* 1.47* 1.31*  CALCIUM 8.2*  --  8.8* 8.8* 9.0  MG  --  1.9  --   --   --   PROT 5.6*  --   --   --   --   ALBUMIN 3.1*  --   --   --   --   AST 26  --   --   --   --  ALT 21  --   --   --   --   ALKPHOS 99  --   --   --   --   BILITOT 1.3*  --   --   --   --   GFRNONAA 37*  --  31* 34* 39*  ANIONGAP 8  --  _0 Lipids No results for input(s): CHOL, TRIG, HDL, LABVLDL, LDLCALC, CHOLHDL in the last 168 hours.  Hematology Recent Labs  Lab 01/08/22 1313 01/08/22 1319 01/08/22 1320  HGB 12.9 12.9 13.3  HCT 38.0 38.0 39.0   Thyroid No results for input(s): TSH, FREET4 in the last 168 hours.  BNPNo results for input(s): BNP, PROBNP in the last 168 hours.  DDimer No results for input(s): DDIMER in the last 168 hours.   Radiology    No results found.  Cardiac Studies   Right/Left Cardiac Catheterization 01/08/2022:   Mid LAD lesion is 55% stenosed.   Prox Cx lesion is 30% stenosed.   Prox RCA lesion is 45% stenosed.   Hemodynamic findings consistent with severe pulmonary hypertension.(See Below)   POST-OPERATIVE DIAGNOSIS:   Moderate two-vessel CAD with ~55% mid LAD disease and 45 to 50% proximal RCA disease otherwise minimal disease in the rest of the coronary arteries Severe Combined Pulmonary Hypertension -mostly related to Acute on ChronicDiastolic Heart Failure, clearly with underlying pulmonary disease as well: PAP 68/30 mmHg with mean of 49 mmHg; PCWP  is 29 to 30 mmHg, TPG 20            LV P-EDP 120/16 mmHg - 26 mmHg.            RAP 22 mmHg, RVP-EDP 72/15 mmHg - 26 mmHg;             Ao P8-MAP 132/66 mmHg - 94 mmHg            Moderate his Cardiac Function: Cardiac Output-Index 3.69-2.25.            Permanent atrial fibrillation   PLAN OF CARE:  Admit to inpatient   Given her advanced age, borderline renal function, and significant pulmonary hypertension both combined pulmonary and secondary diastolic LV failure with elevated filling pressures, reasonable to admit to the hospital to allow for IV diuresis and possible consideration of milrinone inotropic support.->  I have written for IV Lasix 40 MG bid, however I suspect she may need inotropic assistance.  With setting of atrial fibrillation, would probably recommend milrinone. Restart Xarelto tonight  Diagnostic Dominance: Right  _______________  Echocardiogram 01/09/2022: Impressions: 1. Left ventricular ejection fraction, by estimation, is 50 to 55%. Left  ventricular ejection fraction by PLAX is 52 %. The left ventricle has low  normal function. The left ventricle has no regional wall motion  abnormalities. There is mild left  ventricular hypertrophy. Left ventricular diastolic function could not be  evaluated. There is the interventricular septum is flattened in systole  and diastole, consistent with right ventricular pressure and volume  overload.   2. Right ventricular systolic function is moderately reduced. The right  ventricular size is mildly enlarged. There is mildly elevated pulmonary  artery systolic pressure. The estimated right ventricular systolic  pressure is 27.7 mmHg.   3. Right atrial size was moderately dilated.   4. A small pericardial effusion is present. The pericardial effusion is  circumferential. There is no evidence of cardiac tamponade.   5. The mitral valve is abnormal. Mild mitral valve regurgitation.  6. Tricuspid valve regurgitation is  moderate.   7. The aortic valve is tricuspid. Aortic valve regurgitation is not  visualized.   8. The inferior vena cava is normal in size with <50% respiratory  variability, suggesting right atrial pressure of 8 mmHg.   9. Rhythm strip during this exam demostrated atrial fibrillation.   Comparison(s): Changes from prior study are noted. 06/14/2021: LVEF 55-60%,  normal RV function, moderate biatrial enlargement.   Patient Profile     88 y.o. female with a history of chronic diastolic CHF, permanent atrial fibrillation on Xarelto, paroxysmal SVT, hypertension, hyperlipidemia, CKD stage III, COPD, ILD and Alzheimer disease who presented for planned outpatient right/left cardiac catheterization on 01/08/2022. Cath showed elevated filling pressures with cardiac output of 3.69 and cardiac index of 2.25. She was addmitted for IV diuresis for acute on chronic diastolic CHF and pulmonary hypertension  Assessment & Plan     #Acute on Chronic Diastolic CHF   I follow patient   She had progressive SOB over the past year   Increased LE edema    Tried to treat medically    True volume status difficult to ascertain   Pt finally brought in for R and L heart cath     The pt had a R/LHC on 01/08/2022 showed non-obstructive CAD with severe combined pulmonary hypertension mostly due to acute on chronic diastolic CHF. LVEDP was elevated at 8mHg. PCWP was 29-30 mmHg. Cardiac output 3.69 and cardiac index 2.25. TPG 20. Pulmonary HTN Group II,  Group III (COPD and ILD). Echo showed LVEF of 50-55% with normal wall motion and mild LVH. Interventricular septum flattened in systole and diastole consistent with RV pressure and volume overload. RV mildly enlarged with moderately reduced systolic function and mildly elevated PASP of 45.0 mmHg. She was initially started IV Lasix 412mtwice daily and then increased to 8010mwice daily.   The pt has diuresed 10 L since admit   Volume appears pretty good   Cr has improved 1.3  today   BUN is increased to 44 sug patient becoming more dry .    Pt was on 80 lasix bid then qd here   Last dose yesterday  Would started 60 mg lasix today PO    (was on torsemide 30 mg daily in the fall)   Add 10 KCL    Strict I/O Follow renal function   in am   Will need daily weights at home     #Non-Obstructive CAD Noted on cath this admission. - No angina. - No Aspirin due to need for DOAC. - Continue beta-blocker and statin.  #Permanent Atrial Fibrillation Rate controlled. Rates mostly in the 70s to 90s. - Continue Atenolol 9m68mice daily. - Continue Xarelto 15mg79mly.  #Hypertension BP well controlled. - Continue Atenolol as above.  #Hyperlipidemia Lipid panel in 11/2021: Total Cholesterol 123, Triglycerides 79, HDL 40, LDL 67. - Continue Simvastatin 10mg 68my.  #CKD Stage III Recent baseline around 1.4 to 1.7.  Today 1.3   - at baseline, stable  #Gout flare: she has a gout flare with diureses noted 1/14. Will treat with 1.2 mg and 0.6 thereafter. Ok with crt clearance. Treated with Colchicine    Will check uric acid  Asymptoamtic     #Alzheimer's Disease Continue home Aricept and Namenda.   #   Disp    Pt lives at home     WIll have PT see today    Need SW to assess with needs  For questions or updates, please contact [place] Please consult [email or web address] for contact info under        Signed, [name], MD  01/13/2022, 7:42 AM

## 2022-01-13 NOTE — Progress Notes (Signed)
Mobility Specialist Progress Note    01/13/22 1318  Mobility  Activity Ambulated in hall  Level of Assistance Minimal assist, patient does 75% or more  Assistive Device Front wheel walker  Distance Ambulated (ft) 200 ft  Mobility Ambulated with assistance in hallway  Mobility Response Tolerated fair  Mobility performed by Mobility specialist  Bed Position Chair  $Mobility charge 1 Mobility   Pre-Mobility: 93 HR, 136/87 BP, 95% SpO2  Pt received in bed and agreeable. Pt was minA supine > sit and from STS. Pt ambulated on RA and monitor consistently alerted to desatting into low 80s. Pt had no c/o lightheadedness or dizziness. Upon return to room pt left in chair and questioned why she has been panting so much lately even at rest. RN notified. Pt left in chair with call bell in reach and son present.   [facility] Mobility Specialist  M.S. 2C and 6E: [phone] M.S. 4E: [phone]

## 2022-01-14 ENCOUNTER — Telehealth: Payer: Self-pay | Admitting: Internal Medicine

## 2022-01-14 DIAGNOSIS — I10 Essential (primary) hypertension: Secondary | ICD-10-CM

## 2022-01-14 MED ORDER — MUSCLE RUB 10-15 % EX CREA
TOPICAL_CREAM | CUTANEOUS | Status: DC
  Filled 2022-01-14: qty 85

## 2022-01-14 MED ORDER — ALLOPURINOL 100 MG PO TABS
100.0000 mg | ORAL_TABLET | Freq: Every day | ORAL | Status: DC
  Administered 2022-01-14 – 2022-01-17 (×4): 100 mg via ORAL
  Filled 2022-01-14 (×4): qty 1

## 2022-01-14 NOTE — Telephone Encounter (Signed)
Son of the patient called. The patient has been admitted to the hospital and is being treated for Gout. He was not sure how she developed this condition or what to expect for the future. Please call the patient's son

## 2022-01-14 NOTE — Progress Notes (Signed)
Mobility Specialist Progress Note    01/14/22 1531  Mobility  Bed Position Chair  Activity Ambulated with assistance in hallway  Level of Assistance Moderate assist, patient does 50-74%  Assistive Device Four wheel walker  Distance Ambulated (ft) 80 ft  Activity Response Tolerated fair  $Mobility charge 1 Mobility   Pre-Mobility: 90 HR, 154/61 BP, 92% SpO2 Post-Mobility: 83 HR  Pt received in bed and agreeable. Pt was modA to sit up and needed max cueing to lean forward and support herself. Pt minA to stand but needed max cueing for gait. Pt had tremors and slightly unsteady with rollator. Returned to chair with call bell in reach and son present. Pt emotional in chair about not being able to do anything. RN notified.   [facility] Mobility Specialist  M.S. 2C and 6E: [phone] M.S. 4E: [phone]

## 2022-01-14 NOTE — Progress Notes (Addendum)
Progress Note  Patient Name: [name] Dyer Date of Encounter: 01/14/2022  [facility] Cardiologist: [name], MD   Subjective   She feels terrible all over, but can't tell me specifics.  Inpatient Medications    Scheduled Meds:  vitamin C  250 mg Oral Daily   atenolol  50 mg Oral BID   calcium carbonate  1,250 mg Oral q AM   colchicine  0.6 mg Oral Daily   donepezil  10 mg Oral QHS   furosemide  60 mg Oral Daily   gabapentin  300 mg Oral TID   melatonin  10 mg Oral QHS   memantine  5 mg Oral BID   potassium chloride  10 mEq Oral Daily   rivaroxaban  15 mg Oral Q supper   simvastatin  10 mg Oral Daily   sodium chloride flush  3 mL Intravenous Q12H   vitamin B-12  1,000 mcg Oral Daily   Continuous Infusions:  sodium chloride     PRN Meds: sodium chloride, acetaminophen, acetaminophen, ipratropium, Muscle Rub, ondansetron (ZOFRAN) IV, sodium chloride flush, traZODone   Vital Signs    Vitals:   01/13/22 0624 01/13/22 1134 01/13/22 2100 01/14/22 0424  BP: 137/75 132/86 (!) 141/68 118/73  Pulse: 83 96 88 75  Resp: _0 Temp: 97.8 F (36.6 C) (!) 97.2 F (36.2 C) 97.7 F (36.5 C) 97.7 F (36.5 C)  TempSrc: Oral Axillary Oral Oral  SpO2: 93% 91% 97% 100%  Weight: 58.8 kg   57.5 kg  Height:       No intake or output data in the 24 hours ending 01/14/22 0650 Last 3 Weights 01/14/2022 01/13/2022 01/12/2022  Weight (lbs) 126 lb 12.2 oz 129 lb 10.1 oz 133 lb 13.1 oz  Weight (kg) 57.5 kg 58.8 kg 60.7 kg      Telemetry    Atrial fibrillation in the 80s - Personally Reviewed  ECG    No new tracings - Personally Reviewed  Physical Exam   GEN: No acute distress.   Neck: No JVD Cardiac: RRR, no murmurs, rubs, or gallops.  Respiratory: anterior lung exam unlabored GI: Soft, nontender, non-distended  MS: No edema; No deformity. Neuro:  Nonfocal  Psych: Normal affect   Labs    High Sensitivity Troponin:  No results for input(s): TROPONINIHS in  the last 720 hours.   Chemistry Recent Labs  Lab 01/09/22 0253 01/10/22 1100 01/11/22 0254 01/12/22 0201 01/13/22 0154  NA 138  --  142 139 140  K 3.9  --  3.1* 3.1* 3.7  CL 108  --  96* 94* 95*  CO2 22  --  34* 32 33*  GLUCOSE 89  --  108* 106* 98  BUN 37*  --  37* 41* 44*  CREATININE 1.39*  --  1.58* 1.47* 1.31*  CALCIUM 8.2*  --  8.8* 8.8* 9.0  MG  --  1.9  --   --   --   PROT 5.6*  --   --   --   --   ALBUMIN 3.1*  --   --   --   --   AST 26  --   --   --   --   ALT 21  --   --   --   --   ALKPHOS 99  --   --   --   --   BILITOT 1.3*  --   --   --   --  GFRNONAA 37*  --  31* 34* 39*  ANIONGAP 8  --  _0 Lipids No results for input(s): CHOL, TRIG, HDL, LABVLDL, LDLCALC, CHOLHDL in the last 168 hours.  Hematology Recent Labs  Lab 01/08/22 1313 01/08/22 1319 01/08/22 1320  HGB 12.9 12.9 13.3  HCT 38.0 38.0 39.0   Thyroid No results for input(s): TSH, FREET4 in the last 168 hours.  BNPNo results for input(s): BNP, PROBNP in the last 168 hours.  DDimer No results for input(s): DDIMER in the last 168 hours.   Radiology    No results found.  Cardiac Studies   Echo 01/09/22: 1. Left ventricular ejection fraction, by estimation, is 50 to 55%. Left  ventricular ejection fraction by PLAX is 52 %. The left ventricle has low  normal function. The left ventricle has no regional wall motion  abnormalities. There is mild left  ventricular hypertrophy. Left ventricular diastolic function could not be  evaluated. There is the interventricular septum is flattened in systole  and diastole, consistent with right ventricular pressure and volume  overload.   2. Right ventricular systolic function is moderately reduced. The right  ventricular size is mildly enlarged. There is mildly elevated pulmonary  artery systolic pressure. The estimated right ventricular systolic  pressure is 02.7 mmHg.   3. Right atrial size was moderately dilated.   4. A small pericardial  effusion is present. The pericardial effusion is  circumferential. There is no evidence of cardiac tamponade.   5. The mitral valve is abnormal. Mild mitral valve regurgitation.   6. Tricuspid valve regurgitation is moderate.   7. The aortic valve is tricuspid. Aortic valve regurgitation is not  visualized.   8. The inferior vena cava is normal in size with <50% respiratory  variability, suggesting right atrial pressure of 8 mmHg.   9. Rhythm strip during this exam demostrated atrial fibrillation.    Left heart cath 01/08/22:   Mid LAD lesion is 55% stenosed.   Prox Cx lesion is 30% stenosed.   Prox RCA lesion is 45% stenosed.   Hemodynamic findings consistent with severe pulmonary hypertension.(See Below)   POST-OPERATIVE DIAGNOSIS:   Moderate two-vessel CAD with ~55% mid LAD disease and 45 to 50% proximal RCA disease otherwise minimal disease in the rest of the coronary arteries Severe Combined Pulmonary Hypertension -mostly related to Acute on ChronicDiastolic Heart Failure, clearly with underlying pulmonary disease as well: PAP 68/30 mmHg with mean of 49 mmHg; PCWP is 29 to 30 mmHg,             LV P-EDP 120/16 mmHg - 26 mmHg.            RAP 22 mmHg, RVP-EDP 72/15 mmHg - 26 mmHg;             Ao P8-MAP 132/66 mmHg - 94 mmHg            Moderate his Cardiac Function: Cardiac Output-Index 3.69-2.25.            Permanent atrial fibrillation   PLAN OF CARE:  Admit to inpatient   Given her advanced age, borderline renal function, and significant pulmonary hypertension both combined pulmonary and secondary diastolic LV failure with elevated filling pressures, reasonable to admit to the hospital to allow for IV diuresis and possible consideration of milrinone inotropic support.->  I have written for IV Lasix 40 MG bid, however I suspect she may need inotropic assistance.  With setting of atrial fibrillation, would probably recommend  milrinone. [place] tonight   Patient  Profile     88 y.o. female with a history of chronic diastolic CHF, permanent atrial fibrillation on Xarelto, paroxysmal SVT, hypertension, hyperlipidemia, CKD stage III, COPD, ILD and Alzheimer disease who presented for planned outpatient right/left cardiac catheterization on 01/08/2022. Cath showed elevated filling pressures with cardiac output of 3.69 and cardiac index of 2.25. She was addmitted for IV diuresis for acute on chronic diastolic CHF and pulmonary hypertension  Assessment & Plan    Acute on chronic diastolic heart failure Severe combined pulmonary hypertension Hypertension Has been diuresing on 60 mg lasix BID, transitioned from IV lasix yesterday. Weight is 126 lbs from 145.94 on 01/10/22. Atenolol was reduced to 50 mg daily for pressure. She is tolerating this well. Difficult to evaluate fluid status.    Permanent atrial fibrillation  Chronic anticoagulation Rate controlled. Continue atenolol 50 mg and xarelto 15 mg daily.   Hyperlipidemia Continue simvastatin 12/20/2021: Cholesterol 123; HDL 40; LDL Cholesterol (Calc) 67; Triglycerides 79    For questions or updates, please contact [place] Please consult [email or web address] for contact info under        Signed, Ledora Bottcher, PA  01/14/2022, 6:50 AM    Patient seen and examined  I agree with findings of A [facility] above  Today, pt is diffusely achy  Neck:  JVP is normal Lungs are relatively clear [place]   Tender to palpation on R Cardiac exam:   Irreg irreg   No S3 Abd   Soft Ext   No edema   Feet tender   2+ DP   Heel dressing on  Labs:   Uric Acid 11.7    BUN/CR 44/1.3  Imp Acute on chronic diastolic CHF    Pt has diuresed significanty since admit   Currently on 60 lasix daily   WIlln eed to follow renal function   Daily wts  HTN   Keep on 50 bid atenolol  Follow   Afib  Permanent   Continue Xarelto  and atenolol    Uric acid   Start allopurinol  WIll need follow up    Needs may change  Renal   FOllow as outpt   OVerall pt is fragile    PT has seen patient   [facility]

## 2022-01-14 NOTE — Care Management Important Message (Signed)
Important Message  Patient Details  Name: [name] Dyer MRN: [id number] Date of Birth: 03/17/1932   Medicare Important Message Given:  Yes     Shelda Altes 01/14/2022, 8:24 AM

## 2022-01-14 NOTE — Telephone Encounter (Signed)
Spoke with pt's son, [name] Dyer, [name] who states pt currently admitted to hospital and diagnosed with gout.  [name] son states this is the first time he is aware of that pt has had gout.  He states she was doing well yesterday and today is complaining of "everything hurting."  RN explained to pt's son gout is a painful condition that occurs when uric acid levels are increased in the body.  This is monitored by lab work.  Medication is prescribed to help manage symptoms as well as dietary limitations. Pt encouraged to discuss his concerns with nursing staff and attending physician at the hospital as well as PCP at pt's follow up visit once she is discharged from the hospital.   [name] son verbalizes understanding and agrees with current plan.

## 2022-01-15 ENCOUNTER — Other Ambulatory Visit (HOSPITAL_COMMUNITY): Payer: Self-pay

## 2022-01-15 LAB — CBC WITH DIFFERENTIAL/PLATELET
Abs Immature Granulocytes: 0.06 10*3/uL (ref 0.00–0.07)
Basophils Absolute: 0.1 10*3/uL (ref 0.0–0.1)
Basophils Relative: 0 %
Eosinophils Absolute: 0.3 10*3/uL (ref 0.0–0.5)
Eosinophils Relative: 3 %
HCT: 39.5 % (ref 36.0–46.0)
Hemoglobin: 12.7 g/dL (ref 12.0–15.0)
Immature Granulocytes: 1 %
Lymphocytes Relative: 6 %
Lymphs Abs: 0.7 10*3/uL (ref 0.7–4.0)
MCH: 29.5 pg (ref 26.0–34.0)
MCHC: 32.2 g/dL (ref 30.0–36.0)
MCV: 91.9 fL (ref 80.0–100.0)
Monocytes Absolute: 1.1 10*3/uL — ABNORMAL HIGH (ref 0.1–1.0)
Monocytes Relative: 10 %
Neutro Abs: 9.1 10*3/uL — ABNORMAL HIGH (ref 1.7–7.7)
Neutrophils Relative %: 80 %
Platelets: UNDETERMINED 10*3/uL (ref 150–400)
RBC: 4.3 MIL/uL (ref 3.87–5.11)
RDW: 15.8 % — ABNORMAL HIGH (ref 11.5–15.5)
WBC: 11.2 10*3/uL — ABNORMAL HIGH (ref 4.0–10.5)
nRBC: 0 % (ref 0.0–0.2)

## 2022-01-15 LAB — BASIC METABOLIC PANEL
Anion gap: 11 (ref 5–15)
BUN: 45 mg/dL — ABNORMAL HIGH (ref 8–23)
CO2: 30 mmol/L (ref 22–32)
Calcium: 8.8 mg/dL — ABNORMAL LOW (ref 8.9–10.3)
Chloride: 93 mmol/L — ABNORMAL LOW (ref 98–111)
Creatinine, Ser: 1.47 mg/dL — ABNORMAL HIGH (ref 0.44–1.00)
GFR, Estimated: 34 mL/min — ABNORMAL LOW (ref >60)
Glucose, Bld: 131 mg/dL — ABNORMAL HIGH (ref 70–99)
Potassium: 3.7 mmol/L (ref 3.5–5.1)
Sodium: 134 mmol/L — ABNORMAL LOW (ref 135–145)

## 2022-01-15 LAB — URIC ACID: Uric Acid, Serum: 10.2 mg/dL — ABNORMAL HIGH (ref 2.5–7.1)

## 2022-01-15 NOTE — TOC Benefit Eligibility Note (Signed)
Patient Teacher, English as a foreign language completed.    The patient is currently admitted and upon discharge could be taking colchicine 0.6 mg tablet.  The current 30 day co-pay is, $14.00.   The patient is insured through [employer], [place] Patient Advocate Specialist [place] Patient Advocate Team Direct Number: [phone]  Fax: [phone]

## 2022-01-15 NOTE — Progress Notes (Addendum)
Progress Note  Patient Name: [name] Dyer Date of Encounter: 01/15/2022  [facility] Cardiologist: [name], MD   Subjective   Patient denies any chest pain, palpitations, trouble breathing. Continues to have pain in her feet that is intermittent.   Inpatient Medications    Scheduled Meds:  allopurinol  100 mg Oral Daily   vitamin C  250 mg Oral Daily   atenolol  50 mg Oral BID   calcium carbonate  1,250 mg Oral q AM   colchicine  0.6 mg Oral Daily   donepezil  10 mg Oral QHS   furosemide  60 mg Oral Daily   gabapentin  300 mg Oral TID   melatonin  10 mg Oral QHS   memantine  5 mg Oral BID   Muscle Rub   Topical UD   potassium chloride  10 mEq Oral Daily   rivaroxaban  15 mg Oral Q supper   simvastatin  10 mg Oral Daily   sodium chloride flush  3 mL Intravenous Q12H   vitamin B-12  1,000 mcg Oral Daily   Continuous Infusions:  sodium chloride     PRN Meds: sodium chloride, acetaminophen, acetaminophen, ipratropium, ondansetron (ZOFRAN) IV, sodium chloride flush, traZODone   Vital Signs    Vitals:   01/14/22 2037 01/14/22 2112 01/15/22 0500 01/15/22 0508  BP: 127/61   (!) 122/58  Pulse: 87 96  80  Resp: 18   18  Temp: 98.3 F (36.8 C)   98.3 F (36.8 C)  TempSrc: Oral   Oral  SpO2: 93%   91%  Weight:   57.4 kg   Height:        Intake/Output Summary (Last 24 hours) at 01/15/2022 0820 Last data filed at 01/14/2022 1911 Gross per 24 hour  Intake --  Output 700 ml  Net -700 ml   Last 3 Weights 01/15/2022 01/14/2022 01/13/2022  Weight (lbs) 126 lb 8.7 oz 126 lb 12.2 oz 129 lb 10.1 oz  Weight (kg) 57.4 kg 57.5 kg 58.8 kg      Telemetry    Atrial fibrillation, HR in 80s, occasional PVCs  - Personally Reviewed  ECG    No new tracings - Personally Reviewed  Physical Exam   GEN: No acute distress,   Neck: No JVD Cardiac: Irregular, no murmurs, rubs, or gallops.  Respiratory: Clear to auscultation bilaterally. GI: Soft, mild tenderness to  palpation, non-distended  MS: No edema; No deformity.  Neuro:  Nonfocal  Psych: Normal affect   Labs    High Sensitivity Troponin:  No results for input(s): TROPONINIHS in the last 720 hours.   Chemistry Recent Labs  Lab 01/09/22 0253 01/10/22 1100 01/11/22 0254 01/12/22 0201 01/13/22 0154  NA 138  --  142 139 140  K 3.9  --  3.1* 3.1* 3.7  CL 108  --  96* 94* 95*  CO2 22  --  34* 32 33*  GLUCOSE 89  --  108* 106* 98  BUN 37*  --  37* 41* 44*  CREATININE 1.39*  --  1.58* 1.47* 1.31*  CALCIUM 8.2*  --  8.8* 8.8* 9.0  MG  --  1.9  --   --   --   PROT 5.6*  --   --   --   --   ALBUMIN 3.1*  --   --   --   --   AST 26  --   --   --   --  ALT 21  --   --   --   --   ALKPHOS 99  --   --   --   --   BILITOT 1.3*  --   --   --   --   GFRNONAA 37*  --  31* 34* 39*  ANIONGAP 8  --  _0 Lipids No results for input(s): CHOL, TRIG, HDL, LABVLDL, LDLCALC, CHOLHDL in the last 168 hours.  Hematology Recent Labs  Lab 01/08/22 1313 01/08/22 1319 01/08/22 1320  HGB 12.9 12.9 13.3  HCT 38.0 38.0 39.0   Thyroid No results for input(s): TSH, FREET4 in the last 168 hours.  BNPNo results for input(s): BNP, PROBNP in the last 168 hours.  DDimer No results for input(s): DDIMER in the last 168 hours.   Radiology    No results found.  Cardiac Studies   Echo 01/09/22: 1. Left ventricular ejection fraction, by estimation, is 50 to 55%. Left  ventricular ejection fraction by PLAX is 52 %. The left ventricle has low  normal function. The left ventricle has no regional wall motion  abnormalities. There is mild left  ventricular hypertrophy. Left ventricular diastolic function could not be  evaluated. There is the interventricular septum is flattened in systole  and diastole, consistent with right ventricular pressure and volume  overload.   2. Right ventricular systolic function is moderately reduced. The right  ventricular size is mildly enlarged. There is mildly elevated  pulmonary  artery systolic pressure. The estimated right ventricular systolic  pressure is 88.8 mmHg.   3. Right atrial size was moderately dilated.   4. A small pericardial effusion is present. The pericardial effusion is  circumferential. There is no evidence of cardiac tamponade.   5. The mitral valve is abnormal. Mild mitral valve regurgitation.   6. Tricuspid valve regurgitation is moderate.   7. The aortic valve is tricuspid. Aortic valve regurgitation is not  visualized.   8. The inferior vena cava is normal in size with <50% respiratory  variability, suggesting right atrial pressure of 8 mmHg.   9. Rhythm strip during this exam demostrated atrial fibrillation.      Left heart cath 01/08/22:   Mid LAD lesion is 55% stenosed.   Prox Cx lesion is 30% stenosed.   Prox RCA lesion is 45% stenosed.   Hemodynamic findings consistent with severe pulmonary hypertension.(See Below)   POST-OPERATIVE DIAGNOSIS:   Moderate two-vessel CAD with ~55% mid LAD disease and 45 to 50% proximal RCA disease otherwise minimal disease in the rest of the coronary arteries Severe Combined Pulmonary Hypertension -mostly related to Acute on ChronicDiastolic Heart Failure, clearly with underlying pulmonary disease as well: PAP 68/30 mmHg with mean of 49 mmHg; PCWP is 29 to 30 mmHg,             LV P-EDP 120/16 mmHg - 26 mmHg.            RAP 22 mmHg, RVP-EDP 72/15 mmHg - 26 mmHg;             Ao P8-MAP 132/66 mmHg - 94 mmHg            Moderate his Cardiac Function: Cardiac Output-Index 3.69-2.25.            Permanent atrial fibrillation   PLAN OF CARE:  Admit to inpatient   Given her advanced age, borderline renal function, and significant pulmonary hypertension both combined pulmonary and secondary diastolic LV failure with elevated  filling pressures, reasonable to admit to the hospital to allow for IV diuresis and possible consideration of milrinone inotropic support.->  I have written for IV Lasix 40  MG bid, however I suspect she may need inotropic assistance.  With setting of atrial fibrillation, would probably recommend milrinone. [name] tonight  Patient Profile     88 y.o. female with a history of chronic diastolic CHF, permanent atrial fibrillation on Xarelto, paroxysmal SVT, hypertension, hyperlipidemia, CKD stage III, COPD, ILD and Alzheimer disease who presented for planned outpatient right/left cardiac catheterization on 01/08/2022. Cath showed elevated filling pressures with cardiac output of 3.69 and cardiac index of 2.25. She was addmitted for IV diuresis for acute on chronic diastolic CHF and pulmonary hypertension.   Assessment & Plan    Acute on chronic diastolic heart failure Severe combined pulmonary hypertension Hypertension - Has been diuresing on 60 mg lasix daily, was transitioned from IV 51m lasix BID to 60 mg PO daily on 1/16. - Weight is 126 lbs from 145.94 on 01/10/22. I/Os net -10.92L  - Atenolol was reduced to 50 mg BID for pressure. BP is tolerating this well.  - Difficult to evaluate fluid status, creatinine/BUN has not been measured since 1/16. Ordered BMP to assess   - Has an outpatient appointment scheduled with Dr. RHarrington Challengeron 01/29/22.   Permanent atrial fibrillation  Chronic anticoagulation - Rate controlled (HR in 80s per telemetry).  - Continue atenolol 50 mg BID and xarelto 15 mg daily.   Hyperlipidemia - Continue simvastatin - 12/20/2021: Cholesterol 123; HDL 40; LDL Cholesterol (Calc) 67; Triglycerides 79  Gout  - Flare associated with diureses noted on 1/14  - Given 1.2 mg, 0.6 thereafter  - Continue 0.6 until flare improved  - Started allopurinol on 1/17, will need follow up  CAD  - Cath on admission, moderate 2 vessel CAD with ~55% mid LAD disease and 45 to 50% proximal RCA  - No need for aspirin due to DOAC  - Continue beta-blocker, statin   For questions or updates, please contact [facility] Please consult [email or web address] for  contact info under        Signed, KMargie Billet PA-C  01/15/2022, 8:20 AM    Patient seen and examined   I agree with findings as  noted by KSandria Senterabove    Pt has diuresed significantly from admit  (almost 11 L) Laying in bed flat   Says she has some SOB  Achy  Neck;  JVP is normal  Lungs CTA Cardiac Irreg irreg   N oS3   Chest   Tender diffusely Abd  No RUQ tenderness Ext   No edema   I think the pt has reache a weight / fluid status that is good     Keep on current lasix   Check BMET today  The pt has not been up except to chair   Lives alone  Food from family   Will have SW eval   With PT  to assess needs/options for discharge.  PDorris CarnesMD

## 2022-01-15 NOTE — TOC Initial Note (Addendum)
Transition of Care [facility]) - Initial/Assessment Note    Patient Details  Name: [name] Dyer MRN: [id number] Date of Birth: April 15, 1932  Transition of Care [facility]) CM/SW Contact:    Bethena Roys, RN Phone Number: 01/15/2022, 1:02 PM  Clinical Narrative: Case Manager spoke with patient and son on yesterday regarding disposition needs. Medicare.gov list provided to the patient. Case Manager reached out to [facility] and they can service the patient for Physical Therapy. Start of care to begin within 24-48 hours of transition home. Case Manager provided the patient with brochures for personal care services. Son states patient has a long term care policy that may assist in cost.  Family is aware that they will have to arrange the personal care services. Benefits check submitted for colchicine. Case Manager will continue to follow for additional disposition needs.            01-15-22 Home Health RN orders placed in the system for disease management.    Expected Discharge Plan: [place] Barriers to Discharge: Continued Medical Work up   Patient Goals and CMS Choice Patient states their goals for this hospitalization and ongoing recovery are:: to return home CMS Medicare.gov Compare Post Acute Care list provided to:: Patient Choice offered to / list presented to : Adult Children  Expected Discharge Plan and Services Expected Discharge Plan: [place] In-house Referral: NA Discharge Planning Services: CM Consult Post Acute Care Choice: [place] arrangements for the past 2 months: Single Family Home                 DME Arranged: N/A DME Agency: NA       HH Arranged: PT HH Agency: [facility] Date [place]: 01/15/22 Time [place] Agency Contacted: [place] Representative spoke with at [place]: Anderson Malta  Prior Living Arrangements/Services Living arrangements for the past 2 months: [place] Lives with::  Self Patient language and need for interpreter reviewed:: Yes Do you feel safe going back to the place where you live?: Yes      Need for Family Participation in Patient Care: Yes (Comment) Care giver support system in place?: Yes (comment)   Criminal Activity/Legal Involvement Pertinent to Current Situation/Hospitalization: No - Comment as needed  Activities of Daily Living Home Assistive Devices/Equipment: Environmental consultant (specify type), Eyeglasses, Shower chair with back ADL Screening (condition at time of admission) [name] cognitive ability adequate to safely complete daily activities?: Yes Is the patient deaf or have difficulty hearing?: No Does the patient have difficulty seeing, even when wearing glasses/contacts?: No Does the patient have difficulty concentrating, remembering, or making decisions?: Yes Patient able to express need for assistance with ADLs?: Yes Does the patient have difficulty dressing or bathing?: No Independently performs ADLs?: Yes (appropriate for developmental age) Does the patient have difficulty walking or climbing stairs?: Yes Weakness of Legs: Both Weakness of Arms/Hands: None  Permission Sought/Granted Permission sought to share information with : Family Supports, Customer service manager, Case Optician, dispensing granted to share information with : Yes, Verbal Permission Granted     Permission granted to share info w AGENCY: [facility]        Emotional Assessment Appearance:: Appears stated age Attitude/Demeanor/Rapport: Engaged Affect (typically observed): Appropriate Orientation: : Oriented to  Time, Oriented to Place, Oriented to Self, Oriented to Situation Alcohol / Substance Use: Not Applicable Psych Involvement: No (comment)  Admission diagnosis:  Acute on chronic diastolic heart failure ([place]) [I50.33] Patient Active  Problem List   Diagnosis Date Noted   Dyspnea on minimal exertion 01/08/2022   Atypical angina ([place]) 01/08/2022    Acute on chronic diastolic heart failure ([place]) 01/08/2022   Neuropathy 07/17/2021   Hand joint pain 12/24/2020   Chronic dermatitis 11/24/2020   Acute cystitis 05/22/2020   Chronic back pain 05/22/2020   Cystocele without uterine prolapse 05/22/2020   Pain of toe of left foot 04/26/2020   Acute on chronic diastolic (congestive) heart failure ([place]) 01/31/2020   CHF (congestive heart failure) ([place]) 01/30/2020   Chronic rhinitis 11/09/2019   Facial pain syndrome 11/09/2019   Rash 11/09/2019   Temporal arteritis ([place]) 11/09/2019   Chronic anticoagulation 06/24/2018   Peripheral neuropathy 06/24/2018   Alzheimer's disease ([place]) 04/08/2018   Insomnia 04/08/2018   Osteoporosis 04/08/2018   Memory difficulty 10/01/2016   Colitis presumed infectious 06/06/2016   Chronic diastolic CHF (congestive heart failure) ([place]) 06/06/2016   Permanent atrial fibrillation ([place]) 06/06/2016   Hypertension 06/06/2016   Hyperlipidemia 06/06/2016   Diarrhea 06/04/2016   Generalized abdominal pain 06/04/2016   Anemia, unspecified 04/27/2012   PCP:  [name], NP Pharmacy:   [facility] [place] [place] - [address] [place] [address] Phone: [phone] Fax: [phone]  [id number] [id number] [place]: [phone]: [address] [address]Phone: [phone]: [phone] Readmission Risk Interventions No flowsheet data found.

## 2022-01-15 NOTE — Progress Notes (Signed)
Physical Therapy Treatment Patient Details Name: [name] Dyer MRN: [id number] DOB: [birth year] Today's Date: 01/15/2022   History of Present Illness Patient is a 88 y/o female who presents on 01/08/22 with SOB and LE edema. Admitted with acute on chronic diastolic CHF. PMH includes A-fib, CHF, HTN, dementia.    PT Comments    Pt walked with Mobility Specialist entirety of the hallway this morning. Pt asleep on entry, wakes easily but is a little groggy through session. Pt request to get up to bathroom with therapy, requiring min A for bed mobility and modA for power up to standing with RW. Unfortunately, pt became incontinent of urine in standing. Pt cleaned up and new gown and socks placed. Discussed incontinence with pt and son and pt relates that she has Depends at home. PT asked son to make sure pt has some Depends for going home. Pt stands a second time and is incontinent of urine again. After cleaning up again, pt request to return to bed. Case manager notes family is working on personal care attendant with assist of long term care insurance. PT continues to recommend HHPT at discharge to progress strengthening and mobility.     Recommendations for follow up therapy are one component of a multi-disciplinary discharge planning process, led by the attending physician.  Recommendations may be updated based on patient status, additional functional criteria and insurance authorization.  Follow Up Recommendations  Home health PT     Assistance Recommended at Discharge Frequent or constant Supervision/Assistance  Patient can return home with the following A little help with walking and/or transfers;A little help with bathing/dressing/bathroom;Assistance with cooking/housework;Direct supervision/assist for financial management;Assist for transportation   Equipment Recommendations  None recommended by PT       Precautions / Restrictions Precautions Precautions: Fall;Other  (comment) Precaution Comments: watch 02 Restrictions Weight Bearing Restrictions: No RUE Weight Bearing: Non weight bearing     Mobility  Bed Mobility Overal bed mobility: Needs Assistance Bed Mobility: Supine to Sit, Sit to Supine     Supine to sit: Min assist Sit to supine: Min assist   General bed mobility comments: min A and increased cuing for coming to EoB, able to navigate LE off bed and requires min A for bringing trunk to upright, Min A for returning LE to bed, pt able to bridge hips to straighten in bed    Transfers Overall transfer level: Needs assistance Equipment used: Rolling walker (2 wheels) Transfers: Sit to/from Stand Sit to Stand: Mod assist           General transfer comment: modA for power up into standing vc for hand placement and sequencing, stands x2              Balance Overall balance assessment: Needs assistance Sitting-balance support: Feet supported, No upper extremity supported, Single extremity supported Sitting balance-Leahy Scale: Fair Sitting balance - Comments: still drowsy and requires R UE support due to increased R lateral lean Postural control: Right lateral lean                                  Cognition Arousal/Alertness: Awake/alert Behavior During Therapy: WFL for tasks assessed/performed, Flat affect Overall Cognitive Status: History of cognitive impairments - at baseline                                 General  Comments: pt asleep on entry, awake easily but requires increased cuing for task at hand           General Comments General comments (skin integrity, edema, etc.): son present during session, pt incontinent of urine each time she stands up, pt reports she has Depends at home, asked son to make sure she has some when she goes home      Pertinent Vitals/Pain Pain Assessment Pain Assessment: Faces Faces Pain Scale: Hurts a little bit Pain Location: generalized with  movement Pain Descriptors / Indicators: Sore     PT Goals (current goals can now be found in the care plan section) Acute Rehab PT Goals PT Goal Formulation: With patient Time For Goal Achievement: 01/27/22 Potential to Achieve Goals: Good Progress towards PT goals: Progressing toward goals    Frequency    Min 3X/week      PT Plan Current plan remains appropriate       AM-PAC PT "6 Clicks" Mobility   Outcome Measure  Help needed turning from your back to your side while in a flat bed without using bedrails?: A Little Help needed moving from lying on your back to sitting on the side of a flat bed without using bedrails?: A Little Help needed moving to and from a bed to a chair (including a wheelchair)?: A Lot Help needed standing up from a chair using your arms (e.g., wheelchair or bedside chair)?: A Lot Help needed to walk in hospital room?: A Little Help needed climbing 3-5 steps with a railing? : A Little 6 Click Score: 16    End of Session Equipment Utilized During Treatment: Gait belt Activity Tolerance: Patient tolerated treatment well;Other (comment) (limited by incontinence of urine) Patient left: in bed;with call bell/phone within reach;with bed alarm set;with family/visitor present Nurse Communication: Mobility status PT Visit Diagnosis: Pain;Muscle weakness (generalized) (M62.81);Difficulty in walking, not elsewhere classified (R26.2) Pain - part of body: Ankle and joints of foot     Time: 1455-1529 PT Time Calculation (min) (ACUTE ONLY): 34 min  Charges:  $Therapeutic Activity: 23-37 mins                     Nahjae Hoeg B. Migdalia Dk PT, DPT Acute Rehabilitation Services Pager [phone] Office [phone]    [place] 01/15/2022, 5:29 PM

## 2022-01-15 NOTE — Progress Notes (Addendum)
Mobility Specialist Progress Note    01/15/22 1114  Mobility  Bed Position  --   Activity Ambulated with assistance in hallway  Level of Assistance Minimal assist, patient does 75% or more  Assistive Device Four wheel walker  Distance Ambulated (ft) 350 ft  Activity Response Tolerated fair  $Mobility charge 1 Mobility   Pre-Mobility: 77 HR, 121/54 BP During Mobility: 101 HR Post-Mobility: 95 HR  Pt received in bed and agreeable. Pt was minA supine > sit EOB > stand and contact guard for ambulation. Took x3 seated rest breaks. C/o her butt being very sore and saying she has worn it out. Returned to [facility] for attempted BM. Left in BR with NT and RN present.   [place] Mobility Specialist  M.S. 2C and 6E: [phone] M.S. 4E: [phone]

## 2022-01-15 NOTE — Progress Notes (Signed)
Pt son  Ronalee Belts is requesting for an update and also would like to try voltaren cream for his mother's feet.

## 2022-01-16 LAB — CBC
HCT: 39.4 % (ref 36.0–46.0)
Hemoglobin: 12.8 g/dL (ref 12.0–15.0)
MCH: 29.5 pg (ref 26.0–34.0)
MCHC: 32.5 g/dL (ref 30.0–36.0)
MCV: 90.8 fL (ref 80.0–100.0)
Platelets: UNDETERMINED 10*3/uL (ref 150–400)
RBC: 4.34 MIL/uL (ref 3.87–5.11)
RDW: 15.7 % — ABNORMAL HIGH (ref 11.5–15.5)
WBC: 8.9 10*3/uL (ref 4.0–10.5)
nRBC: 0 % (ref 0.0–0.2)

## 2022-01-16 LAB — BASIC METABOLIC PANEL
Anion gap: 10 (ref 5–15)
BUN: 47 mg/dL — ABNORMAL HIGH (ref 8–23)
CO2: 32 mmol/L (ref 22–32)
Calcium: 8.5 mg/dL — ABNORMAL LOW (ref 8.9–10.3)
Chloride: 95 mmol/L — ABNORMAL LOW (ref 98–111)
Creatinine, Ser: 1.66 mg/dL — ABNORMAL HIGH (ref 0.44–1.00)
GFR, Estimated: 30 mL/min — ABNORMAL LOW (ref >60)
Glucose, Bld: 100 mg/dL — ABNORMAL HIGH (ref 70–99)
Potassium: 3.2 mmol/L — ABNORMAL LOW (ref 3.5–5.1)
Sodium: 137 mmol/L (ref 135–145)

## 2022-01-16 MED ORDER — POTASSIUM CHLORIDE CRYS ER 20 MEQ PO TBCR: 40.0000 meq | EXTENDED_RELEASE_TABLET | Freq: Once | ORAL | Status: DC

## 2022-01-16 MED ORDER — POTASSIUM CHLORIDE CRYS ER 10 MEQ PO TBCR
10.0000 meq | EXTENDED_RELEASE_TABLET | Freq: Once | ORAL | Status: AC
  Administered 2022-01-16: 10 meq via ORAL
  Filled 2022-01-16: qty 1

## 2022-01-16 MED ORDER — POTASSIUM CHLORIDE CRYS ER 10 MEQ PO TBCR
10.0000 meq | EXTENDED_RELEASE_TABLET | Freq: Every day | ORAL | Status: DC
  Administered 2022-01-16: 10 meq via ORAL
  Filled 2022-01-16: qty 1

## 2022-01-16 MED ORDER — POTASSIUM CHLORIDE CRYS ER 10 MEQ PO TBCR: 10.0000 meq | EXTENDED_RELEASE_TABLET | Freq: Every day | ORAL | Status: DC

## 2022-01-16 MED ORDER — MUSCLE RUB 10-15 % EX CREA: TOPICAL_CREAM | CUTANEOUS | Status: DC | PRN

## 2022-01-16 NOTE — Progress Notes (Addendum)
Progress Note  Patient Name: [name] Dyer Date of Encounter: 01/16/2022  [facility] Cardiologist: [name], MD   Subjective   Patient is oriented to person, not place or time. Denies any chest pain, palpitations. Continues to have pain in her feet.   Inpatient Medications    Scheduled Meds:  allopurinol  100 mg Oral Daily   vitamin C  250 mg Oral Daily   atenolol  50 mg Oral BID   calcium carbonate  1,250 mg Oral q AM   colchicine  0.6 mg Oral Daily   donepezil  10 mg Oral QHS   furosemide  60 mg Oral Daily   gabapentin  300 mg Oral TID   melatonin  10 mg Oral QHS   memantine  5 mg Oral BID   potassium chloride  10 mEq Oral Daily   rivaroxaban  15 mg Oral Q supper   simvastatin  10 mg Oral Daily   sodium chloride flush  3 mL Intravenous Q12H   vitamin B-12  1,000 mcg Oral Daily   Continuous Infusions:  sodium chloride     PRN Meds: sodium chloride, acetaminophen, acetaminophen, ipratropium, Muscle Rub, ondansetron (ZOFRAN) IV, sodium chloride flush, traZODone   Vital Signs    Vitals:   01/15/22 1300 01/15/22 2216 01/15/22 2219 01/16/22 0500  BP: (!) 121/54 116/66    Pulse: 100 87    Resp: 18  18   Temp: 99.1 F (37.3 C)  98 F (36.7 C)   TempSrc: Axillary  Oral   SpO2: 95%     Weight:    57 kg  Height:        Intake/Output Summary (Last 24 hours) at 01/16/2022 0912 Last data filed at 01/15/2022 1039 Gross per 24 hour  Intake --  Output 200 ml  Net -200 ml   Last 3 Weights 01/16/2022 01/15/2022 01/14/2022  Weight (lbs) 125 lb 10.6 oz 126 lb 8.7 oz 126 lb 12.2 oz  Weight (kg) 57 kg 57.4 kg 57.5 kg      Telemetry     A fib, Heart rate in the 80s- Personally Reviewed  ECG    No new tracings - Personally Reviewed  Physical Exam   GEN: No acute distress,   Neck: No JVD Cardiac: Irregular, no murmurs, rubs, or gallops.  Respiratory: Clear to auscultation bilaterally. GI: Soft, mild tenderness to palpation, non-distended  MS: No edema;  No deformity.  Neuro:  Nonfocal  Psych: Normal affect   Labs    High Sensitivity Troponin:  No results for input(s): TROPONINIHS in the last 720 hours.   Chemistry Recent Labs  Lab 01/10/22 1100 01/11/22 0254 01/13/22 0154 01/15/22 0911 01/16/22 0715  NA  --    < > 140 134* 137  K  --    < > 3.7 3.7 3.2*  CL  --    < > 95* 93* 95*  CO2  --    < > 33* 30 32  GLUCOSE  --    < > 98 131* 100*  BUN  --    < > 44* 45* 47*  CREATININE  --    < > 1.31* 1.47* 1.66*  CALCIUM  --    < > 9.0 8.8* 8.5*  MG 1.9  --   --   --   --   GFRNONAA  --    < > 39* 34* 30*  ANIONGAP  --    < > _0 < > =  values in this interval not displayed.    Lipids No results for input(s): CHOL, TRIG, HDL, LABVLDL, LDLCALC, CHOLHDL in the last 168 hours.  Hematology Recent Labs  Lab 01/15/22 0911 01/16/22 0715  WBC 11.2* 8.9  RBC 4.30 4.34  HGB 12.7 12.8  HCT 39.5 39.4  MCV 91.9 90.8  MCH 29.5 29.5  MCHC 32.2 32.5  RDW 15.8* 15.7*  PLT PLATELET CLUMPS NOTED ON SMEAR, UNABLE TO ESTIMATE PLATELET CLUMPS NOTED ON SMEAR, UNABLE TO ESTIMATE   Thyroid No results for input(s): TSH, FREET4 in the last 168 hours.  BNPNo results for input(s): BNP, PROBNP in the last 168 hours.  DDimer No results for input(s): DDIMER in the last 168 hours.   Radiology    No results found.  Cardiac Studies   Echo 01/09/22: 1. Left ventricular ejection fraction, by estimation, is 50 to 55%. Left  ventricular ejection fraction by PLAX is 52 %. The left ventricle has low  normal function. The left ventricle has no regional wall motion  abnormalities. There is mild left  ventricular hypertrophy. Left ventricular diastolic function could not be  evaluated. There is the interventricular septum is flattened in systole  and diastole, consistent with right ventricular pressure and volume  overload.   2. Right ventricular systolic function is moderately reduced. The right  ventricular size is mildly enlarged. There is  mildly elevated pulmonary  artery systolic pressure. The estimated right ventricular systolic  pressure is 63.7 mmHg.   3. Right atrial size was moderately dilated.   4. A small pericardial effusion is present. The pericardial effusion is  circumferential. There is no evidence of cardiac tamponade.   5. The mitral valve is abnormal. Mild mitral valve regurgitation.   6. Tricuspid valve regurgitation is moderate.   7. The aortic valve is tricuspid. Aortic valve regurgitation is not  visualized.   8. The inferior vena cava is normal in size with <50% respiratory  variability, suggesting right atrial pressure of 8 mmHg.   9. Rhythm strip during this exam demostrated atrial fibrillation.      Left heart cath 01/08/22:   Mid LAD lesion is 55% stenosed.   Prox Cx lesion is 30% stenosed.   Prox RCA lesion is 45% stenosed.   Hemodynamic findings consistent with severe pulmonary hypertension.(See Below)   POST-OPERATIVE DIAGNOSIS:   Moderate two-vessel CAD with ~55% mid LAD disease and 45 to 50% proximal RCA disease otherwise minimal disease in the rest of the coronary arteries Severe Combined Pulmonary Hypertension -mostly related to Acute on ChronicDiastolic Heart Failure, clearly with underlying pulmonary disease as well: PAP 68/30 mmHg with mean of 49 mmHg; PCWP is 29 to 30 mmHg,             LV P-EDP 120/16 mmHg - 26 mmHg.            RAP 22 mmHg, RVP-EDP 72/15 mmHg - 26 mmHg;             Ao P8-MAP 132/66 mmHg - 94 mmHg            Moderate his Cardiac Function: Cardiac Output-Index 3.69-2.25.            Permanent atrial fibrillation   PLAN OF CARE:  Admit to inpatient   Given her advanced age, borderline renal function, and significant pulmonary hypertension both combined pulmonary and secondary diastolic LV failure with elevated filling pressures, reasonable to admit to the hospital to allow for IV diuresis and possible consideration of milrinone  inotropic support.->  I have written  for IV Lasix 40 MG bid, however I suspect she may need inotropic assistance.  With setting of atrial fibrillation, would probably recommend milrinone. Holden tonight  Patient Profile     88 y.o. female with a history of chronic diastolic CHF, permanent atrial fibrillation on Xarelto, paroxysmal SVT, hypertension, hyperlipidemia, CKD stage III, COPD, ILD and Alzheimer disease who presented for planned outpatient right/left cardiac catheterization on 01/08/2022. Cath showed elevated filling pressures with cardiac output of 3.69 and cardiac index of 2.25. She was addmitted for IV diuresis for acute on chronic diastolic CHF and pulmonary hypertension.   Assessment & Plan    Acute on chronic diastolic heart failure Severe combined pulmonary hypertension Hypertension - Has been diuresing on 60 mg lasix daily, was transitioned from IV 60m lasix BID to 60 mg PO daily on 1/16. - Appears euvolemic on exam  - Weight is 125lbs from 145.94 on 01/10/22. I/Os net -11.12L  - Atenolol was reduced to 50 mg BID for pressure. BP is tolerating this well.  - Difficult to evaluate fluid status, creatinine 1.66 (similar to baseline) - K 3.2. Continue 10 mEq daily. Will give an additional 10 mEq today.  - Has an outpatient appointment scheduled with Dr. RHarrington Challengeron 01/29/22.  - PT and social work involved with discharge planning, currently recommending home health  Permanent atrial fibrillation  Chronic anticoagulation - Rate controlled (HR in 80s per telemetry).  - Continue atenolol 50 mg BID and xarelto 15 mg daily.   Hyperlipidemia - Continue simvastatin - 12/20/2021: Cholesterol 123; HDL 40; LDL Cholesterol (Calc) 67; Triglycerides 79  Gout  - Flare associated with diureses noted on 1/14  - Given 1.2 mg, 0.6 thereafter  - Continue 0.6 until flare improved  - Started allopurinol on 1/17, will need follow up  CAD  - Cath on admission, moderate 2 vessel CAD with ~55% mid LAD disease and 45 to 50%  proximal RCA  - No need for aspirin due to DOAC  - Continue beta-blocker, statin   For questions or updates, please contact [facility] Please consult [email or web address] for contact info under        Signed, KMargie Billet PA-C  01/16/2022, 9:12 AM    Patient seen and examined   I agree with findings as noted above by K Johnson above  Pt laying in bed   SMonterey Parkshe just doesn't feel good   Vague    Neck  JVP is normal  Lungs are CTA  Cardiac  Irreg irreg   No S3 Ext aer without edema  Pt has diuresed significantly since admit    I would keep on current regmen of lasix 60   INcrease daily K to 20 mEQ BP OK  HR OK Remains on atenolol and Xarelto for afib  I have spoken with son  She lives along though he says they are there most of the day, except at night  He thought she was better yesterday   Not as good Tues They had planned to stay with patient the first few nights   See how she does   He says someone is usually with her during the day   It is just at night      Spoke with SW   Pt has long term care policy    Told sone he needs to contact them re services (night)  PDorris CarnesMD

## 2022-01-16 NOTE — Progress Notes (Signed)
Mobility Specialist Progress Note    01/16/22 1508  Mobility  Bed Position Chair  Activity Ambulated with assistance in hallway  Level of Assistance Minimal assist, patient does 75% or more  Assistive Device Four wheel walker  Distance Ambulated (ft) 110 ft  Activity Response Tolerated fair  $Mobility charge 1 Mobility   Pt received in bed and agreeable. Had BM in BR. Took no rest breaks. Returned to chair with call bell in reach and visitors present.   [facility] Mobility Specialist  M.S. 2C and 6E: [phone] M.S. 4E: [phone]

## 2022-01-17 ENCOUNTER — Other Ambulatory Visit (HOSPITAL_COMMUNITY): Payer: Self-pay

## 2022-01-17 ENCOUNTER — Other Ambulatory Visit: Payer: Self-pay | Admitting: Cardiology

## 2022-01-17 DIAGNOSIS — I5032 Chronic diastolic (congestive) heart failure: Secondary | ICD-10-CM

## 2022-01-17 LAB — BASIC METABOLIC PANEL
Anion gap: 13 (ref 5–15)
BUN: 51 mg/dL — ABNORMAL HIGH (ref 8–23)
CO2: 28 mmol/L (ref 22–32)
Calcium: 8.5 mg/dL — ABNORMAL LOW (ref 8.9–10.3)
Chloride: 97 mmol/L — ABNORMAL LOW (ref 98–111)
Creatinine, Ser: 1.38 mg/dL — ABNORMAL HIGH (ref 0.44–1.00)
GFR, Estimated: 37 mL/min — ABNORMAL LOW (ref >60)
Glucose, Bld: 100 mg/dL — ABNORMAL HIGH (ref 70–99)
Potassium: 3.4 mmol/L — ABNORMAL LOW (ref 3.5–5.1)
Sodium: 138 mmol/L (ref 135–145)

## 2022-01-17 LAB — CBC
HCT: 40.5 % (ref 36.0–46.0)
Hemoglobin: 12.9 g/dL (ref 12.0–15.0)
MCH: 29.1 pg (ref 26.0–34.0)
MCHC: 31.9 g/dL (ref 30.0–36.0)
MCV: 91.4 fL (ref 80.0–100.0)
Platelets: UNDETERMINED 10*3/uL (ref 150–400)
RBC: 4.43 MIL/uL (ref 3.87–5.11)
RDW: 15.6 % — ABNORMAL HIGH (ref 11.5–15.5)
WBC: 7.1 10*3/uL (ref 4.0–10.5)
nRBC: 0 % (ref 0.0–0.2)

## 2022-01-17 MED ORDER — ROSUVASTATIN CALCIUM 5 MG PO TABS
5.0000 mg | ORAL_TABLET | Freq: Every day | ORAL | 3 refills | Status: DC
Start: 2022-01-18 — End: 2022-02-10
  Filled 2022-01-17: qty 30, 30d supply, fill #0

## 2022-01-17 MED ORDER — ALLOPURINOL 100 MG PO TABS
100.0000 mg | ORAL_TABLET | Freq: Every day | ORAL | 0 refills | Status: DC
  Filled 2022-01-17: qty 30, 30d supply, fill #0

## 2022-01-17 MED ORDER — FUROSEMIDE 20 MG PO TABS
60.0000 mg | ORAL_TABLET | Freq: Every day | ORAL | 5 refills | Status: DC
  Filled 2022-01-17: qty 90, 30d supply, fill #0

## 2022-01-17 MED ORDER — CALCIUM CARBONATE 1250 (500 CA) MG PO TABS
1250.0000 mg | ORAL_TABLET | Freq: Every morning | ORAL | 3 refills | Status: AC
Start: 2022-01-18 — End: ?
  Filled 2022-01-17: qty 30, 30d supply, fill #0

## 2022-01-17 MED ORDER — COLCHICINE 0.6 MG PO TABS
0.6000 mg | ORAL_TABLET | Freq: Every day | ORAL | 0 refills | Status: DC | PRN
  Filled 2022-01-17: qty 30, 30d supply, fill #0

## 2022-01-17 MED ORDER — POTASSIUM CHLORIDE CRYS ER 20 MEQ PO TBCR
20.0000 meq | EXTENDED_RELEASE_TABLET | Freq: Every day | ORAL | 3 refills | Status: DC
  Filled 2022-01-17: qty 30, 30d supply, fill #0

## 2022-01-17 MED ORDER — ROSUVASTATIN CALCIUM 5 MG PO TABS: 5.0000 mg | ORAL_TABLET | Freq: Every day | ORAL | Status: DC

## 2022-01-17 MED ORDER — ATENOLOL 50 MG PO TABS
50.0000 mg | ORAL_TABLET | Freq: Two times a day (BID) | ORAL | 3 refills | Status: DC
  Filled 2022-01-17: qty 60, 30d supply, fill #0

## 2022-01-17 MED ORDER — POTASSIUM CHLORIDE CRYS ER 20 MEQ PO TBCR
20.0000 meq | EXTENDED_RELEASE_TABLET | Freq: Every day | ORAL | Status: DC
  Administered 2022-01-17: 20 meq via ORAL
  Filled 2022-01-17: qty 1

## 2022-01-17 NOTE — Progress Notes (Signed)
° °

## 2022-01-17 NOTE — Discharge Summary (Addendum)
Discharge Summary    Patient ID: [name] Dyer MRN: [id number]; DOB: [birth year]  Admit date: 01/08/2022 Discharge date: 01/17/2022  PCP:  [name], NP   [facility] Providers Cardiologist:  [name], MD   {   Discharge Diagnoses    Principal Problem:   Dyspnea on minimal exertion Active Problems:   Chronic diastolic CHF (congestive heart failure) (HCC)   Permanent atrial fibrillation ([place])   Hypertension   Hyperlipidemia   Memory difficulty   Atypical angina ([place])   Acute on chronic diastolic heart failure ([place])    Diagnostic Studies/Procedures    Echo 01/09/22: 1. Left ventricular ejection fraction, by estimation, is 50 to 55%. Left  ventricular ejection fraction by PLAX is 52 %. The left ventricle has low  normal function. The left ventricle has no regional wall motion  abnormalities. There is mild left  ventricular hypertrophy. Left ventricular diastolic function could not be  evaluated. There is the interventricular septum is flattened in systole  and diastole, consistent with right ventricular pressure and volume  overload.   2. Right ventricular systolic function is moderately reduced. The right  ventricular size is mildly enlarged. There is mildly elevated pulmonary  artery systolic pressure. The estimated right ventricular systolic  pressure is 27.7 mmHg.   3. Right atrial size was moderately dilated.   4. A small pericardial effusion is present. The pericardial effusion is  circumferential. There is no evidence of cardiac tamponade.   5. The mitral valve is abnormal. Mild mitral valve regurgitation.   6. Tricuspid valve regurgitation is moderate.   7. The aortic valve is tricuspid. Aortic valve regurgitation is not  visualized.   8. The inferior vena cava is normal in size with <50% respiratory  variability, suggesting right atrial pressure of 8 mmHg.   9. Rhythm strip during this exam demostrated atrial fibrillation.      Left heart  cath 01/08/22:   Mid LAD lesion is 55% stenosed.   Prox Cx lesion is 30% stenosed.   Prox RCA lesion is 45% stenosed.   Hemodynamic findings consistent with severe pulmonary hypertension.(See Below)   POST-OPERATIVE DIAGNOSIS:   Moderate two-vessel CAD with ~55% mid LAD disease and 45 to 50% proximal RCA disease otherwise minimal disease in the rest of the coronary arteries Severe Combined Pulmonary Hypertension -mostly related to Acute on ChronicDiastolic Heart Failure, clearly with underlying pulmonary disease as well: PAP 68/30 mmHg with mean of 49 mmHg; PCWP is 29 to 30 mmHg,             LV P-EDP 120/16 mmHg - 26 mmHg.            RAP 22 mmHg, RVP-EDP 72/15 mmHg - 26 mmHg;             Ao P8-MAP 132/66 mmHg - 94 mmHg            Moderate his Cardiac Function: Cardiac Output-Index 3.69-2.25.            Permanent atrial fibrillation   PLAN OF CARE:  Admit to inpatient   Given her advanced age, borderline renal function, and significant pulmonary hypertension both combined pulmonary and secondary diastolic LV failure with elevated filling pressures, reasonable to admit to the hospital to allow for IV diuresis and possible consideration of milrinone inotropic support.->  I have written for IV Lasix 40 MG bid, however I suspect she may need inotropic assistance.  With setting of atrial fibrillation, would probably recommend milrinone. Restart Xarelto  tonight   _____________   History of Present Illness     MARLEEN MORET is a 88 y.o. female with hx of atrial fib chronic diastolic CHF, MR, HTN, HL and admit 01/2020 with CHF who is being seen 01/09/2022 for the evaluation of CHF.  Ms. Molock was seen by Dr. Tamala Julian 12/16/21 as urgent visit for SOB and to access volume status.  Her torsemide dose had recently been changed for lower ext edema and dyspnea.   She had DOE, +memory decreasing, and intermittent nosebleeds.  She can be flat at night to sleep and no dyspnea at rest.  Her Cr had  been increasing.  Cr up to 1.95.  Her BNP was elevated.  With absence of orthopnea, and no major complaint of dyspnea, decided to discontinue empagliflozin and torsemide. She was also noted to have orthostatic drop in BP 135/60 to 120/55 with standing.   Her BNP on the 19th of Dec was 4,025 and on the 27th 2,938   her Cr remained the same off torsemide.  K+ was 5.3.   then on 12/31/21 Cr was 1.47 and K+ 5.0    Since the visit on the 19th, while her labs improved, her dyspnea increased.  She also developed lower ext edema.  Dr. Harrington Challenger was in contact with pt and family and there was concern for ischemia as cause of issue.  Therefore pt was scheduled for Rt and lt cardiac cath for further eval of cause of symptoms.   No prior cardiac cath.   Last echo 06/14/21  with EF 55-60%, no RWMA, RV normal, LA moderately dilated, along with RA.  Mild MR, mod TR, trivial AR  In 2019 she did have a lexiscan myoview that was normal.     EKG:  The ECG that was done 12/16/21 was personally reviewed and demonstrates atrial fib with RVR and IRBB but no changes   Pt presented for cath 01/08/22 found to have non obstructive CAD, see report below.  But she does have severe combined pulmonary hypertension, mostly related acute on chronic diastolic HF, with underlying pulmonary disease as well.  PAP 68/30 with mean of 49.  PCWP is 29 to 30 mmHg.  She was admitted for IV diuresis and may need to eval for milrinone inotropic support.   Her Xarelto was resumed.    Labs on 1/12 Na 138 K+ 3.9, Cr 1.39 Alb 3.1 Total 5.6 GFR 37    Vitals on admission BP 125/80 P 96 R 18 afebrile.  Sp02 RA 92% On admission, she reported feeling better than the day before but still not did not feel well.  Her edema increased over a week about 10 lbs.  She could continue to walk but was weak and tired.        Hospital Course     Consultants: none   Acute on chronic diastolic heart failure Severe combined pulmonary hypertension Hypertension -  Currently on lasix 60 mg PO daily. Was diuresed on 80 mg IV BID earlier in the hospitalization.  - Appeared euvolemic on exam. Will continue lasix 60 mg PO daily as an outpatient - Weight is 127lbs from 145.94 on 01/10/22. I/Os net -11 L  - Atenolol was reduced to 50 mg BID for pressure. BP and HR tolerated this well.  - Difficult to evaluate fluid status, creatinine stable at 1.38 (similar to baseline) - K 3.4. Patient was taking either 10 or 20 mEq daily at home while taking 20-82m torsemide. As patient is  now on 60 mg lasix PO daily, will increase daily K supplement to 20 mEq daily  - Has an outpatient appointment scheduled with Dr. Harrington Challenger on 01/29/22.  - PT and social work involved with discharge planning. Dr. Harrington Challenger also discussed with son. Plan is discharge home with home health, son is usually with patient during the days, son understands that he will arrange night services    Permanent atrial fibrillation  Chronic anticoagulation - Rate controlled (HR in 80s per telemetry).  - Continue atenolol 50 mg BID and xarelto 15 mg daily.   Hyperlipidemia - Transitioned from simvastatin to crestor due to simvastatin's risk of interaction with colchicine  - 12/20/2021: Cholesterol 123; HDL 40; LDL Cholesterol (Calc) 67; Triglycerides 79   Gout  - Flare associated with diureses noted on 1/14  - Given 1.2 mg, 0.6 thereafter  - Continue 0.6 until flare improved - Started allopurinol on 1/17 - Family/patient will need to arrange follow up with PCP    CAD  - Cath on admission, moderate 2 vessel CAD with ~55% mid LAD disease and 45 to 50% proximal RCA  - No need for aspirin due to DOAC  - Continue beta-blocker, statin   Did the patient have an acute coronary syndrome (MI, NSTEMI, STEMI, etc) this admission?:  No                               Did the patient have a percutaneous coronary intervention (stent / angioplasty)?:  No.        The patient will be scheduled for a TOC follow up appointment  in 7-14 days.  A message has been sent to the [facility] and Scheduling Pool at the office where the patient should be seen for follow up.  _____________  Discharge Vitals Blood pressure 138/75, pulse 84, temperature 97.6 F (36.4 C), temperature source Oral, resp. rate 18, height _0  (1.626 m), weight 57.6 kg, SpO2 91 %.  Filed Weights   01/15/22 0500 01/16/22 0500 01/17/22 0457  Weight: 57.4 kg 57 kg 57.6 kg    Labs & Radiologic Studies    CBC Recent Labs    01/15/22 0911 01/16/22 0715 01/17/22 0525  WBC 11.2* 8.9 7.1  NEUTROABS 9.1*  --   --   HGB 12.7 12.8 12.9  HCT 39.5 39.4 40.5  MCV 91.9 90.8 91.4  PLT PLATELET CLUMPS NOTED ON SMEAR, UNABLE TO ESTIMATE PLATELET CLUMPS NOTED ON SMEAR, UNABLE TO ESTIMATE PLATELET CLUMPS NOTED ON SMEAR, UNABLE TO ESTIMATE   Basic Metabolic Panel Recent Labs    01/16/22 0715 01/17/22 0239  NA 137 138  K 3.2* 3.4*  CL 95* 97*  CO2 32 28  GLUCOSE 100* 100*  BUN 47* 51*  CREATININE 1.66* 1.38*  CALCIUM 8.5* 8.5*   Liver Function Tests No results for input(s): AST, ALT, ALKPHOS, BILITOT, PROT, ALBUMIN in the last 72 hours. No results for input(s): LIPASE, AMYLASE in the last 72 hours. High Sensitivity Troponin:   No results for input(s): TROPONINIHS in the last 720 hours.  BNP Invalid input(s): POCBNP D-Dimer No results for input(s): DDIMER in the last 72 hours. Hemoglobin A1C No results for input(s): HGBA1C in the last 72 hours. Fasting Lipid Panel No results for input(s): CHOL, HDL, LDLCALC, TRIG, CHOLHDL, LDLDIRECT in the last 72 hours. Thyroid Function Tests No results for input(s): TSH, T4TOTAL, T3FREE, THYROIDAB in the last 72 hours.  Invalid input(s): FREET3 _____________  CARDIAC CATHETERIZATION  Result Date: 01/08/2022   Mid LAD lesion is 55% stenosed.   Prox Cx lesion is 30% stenosed.   Prox RCA lesion is 45% stenosed.   Hemodynamic findings consistent with severe pulmonary hypertension.(See Below) POST-OPERATIVE  DIAGNOSIS:  Moderate two-vessel CAD with ~55% mid LAD disease and 45 to 50% proximal RCA disease otherwise minimal disease in the rest of the coronary arteries Severe Combined Pulmonary Hypertension -mostly related to Acute on ChronicDiastolic Heart Failure, clearly with underlying pulmonary disease as well: PAP 68/30 mmHg with mean of 49 mmHg; PCWP is 29 to 30 mmHg,  LV P-EDP 120/16 mmHg - 26 mmHg.  RAP 22 mmHg, RVP-EDP 72/15 mmHg - 26 mmHg;  Ao P8-MAP 132/66 mmHg - 94 mmHg  Moderate his Cardiac Function: Cardiac Output-Index 3.69-2.25.  Permanent atrial fibrillation PLAN OF CARE: Admit to inpatient  Given her advanced age, borderline renal function, and significant pulmonary hypertension both combined pulmonary and secondary diastolic LV failure with elevated filling pressures, reasonable to admit to the hospital to allow for IV diuresis and possible consideration of milrinone inotropic support.-> I have written for IV Lasix 40 MG bid, however I suspect she may need inotropic assistance.  With setting of atrial fibrillation, would probably recommend milrinone. Restart Xarelto tonight Glenetta Hew, MD  DG CHEST PORT 1 VIEW  Result Date: 01/09/2022 CLINICAL DATA:  Shortness of breath.  Edema. EXAM: PORTABLE CHEST 1 VIEW COMPARISON:  05/05/2018 FINDINGS: Cardiac silhouette is moderately enlarged, and this appears unchanged to mildly worsened from prior. Mediastinal contours are grossly within normal limits. Decreased lung volumes. New mild-to-moderate bilateral mid and lower lung interstitial thickening, greatest at the right lung base. Mild right-greater-than-left basilar heterogeneous airspace opacity. No large pleural effusion. No pneumothorax. 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 Moderate multilevel degenerative osteophytes of the thoracic spine. IMPRESSION:: IMPRESSION: 1. Moderate enlargement of the cardiac silhouette, unchanged to minimally worsened from prior noting PA  technique on the prior study and AP technique on the current study. 2. Mild bilateral interstitial pulmonary edema. 3. Mild right-greater-than-left basilar heterogeneous airspace opacities, possible alveolar pulmonary edema versus atelectasis. Electronically Signed   By: Yvonne Kendall   On: 01/09/2022 14:04   DG Swallowing Func-Speech Pathology  Result Date: 01/10/2022 Table formatting from the original result was not included. Objective Swallowing Evaluation: Type of Study: MBS-Modified Barium Swallow Study  Patient Details Name: BOBBIE VIRDEN MRN: [id number] Date of Birth: Jan 07, 1932 Today's Date: 01/10/2022 Time: SLP Start Time (ACUTE ONLY): 68 -SLP Stop Time (ACUTE ONLY): 1740 SLP Time Calculation (min) (ACUTE ONLY): 9 min Past Medical History: Past Medical History: Diagnosis Date  Alzheimer disease ([place])   Anemia   Arrhythmia   Cataract   Colon polyps   Community acquired pneumonia 01/2012  LLL  Congestive heart failure ([place])   Hyperlipidemia   Hypertension   Memory difficulty 10/01/2016  Osteoporosis   Pleural effusion, right 01/2012  SVT (supraventricular tachycardia) ([place]) 01/2012  UTI (lower urinary tract infection)  Past Surgical History: Past Surgical History: Procedure Laterality Date  [place]  COLONOSCOPY    COLONOSCOPY N/A 06/10/2016  Procedure: COLONOSCOPY;  Surgeon: Manus Gunning, MD;  Location: [facility] END[facility];  Service: Gastroenterology;  Laterality: N/A;  ESOPHAGOGASTRODUODENOSCOPY N/A 06/10/2016  Procedure: ESOPHAGOGASTRODUODENOSCOPY (EGD);  Surgeon: Manus Gunning, MD;  Location: [facility];  Service: Gastroenterology;  Laterality: N/A;  HEMIARTHROPLASTY HIP  2011  Left femoral neck fracture  RIGHT/LEFT HEART CATH AND CORONARY ANGIOGRAPHY N/A 01/08/2022  Procedure: RIGHT/LEFT HEART CATH AND CORONARY ANGIOGRAPHY;  Surgeon: Leonie Man, MD;  Location: [facility];  Service: Cardiovascular;  Laterality: N/A;  UPPER  GASTROINTESTINAL ENDOSCOPY   HPI: YALENA COLON is a 88 y.o. female with hx of atrial fib chronic diastolic CHF, MR, HTN, HL and admit 01/2020 with CHF who is being seen 01/09/2022 for the evaluation of CHF.  Subjective: believes her symptoms are getting worse  Recommendations for follow up therapy are one component of a multi-disciplinary discharge planning process, led by the attending physician.  Recommendations may be updated based on patient status, additional functional criteria and insurance authorization. Assessment / Plan / Recommendation Clinical Impressions 01/10/2022 Clinical Impression [name] oropharyngeal swallowing is functional with adequate efficiency and no aspiration. She had intermittent throat clearing and coughing throughout the study despite no aspiration occurring. She did appear to have barium, including barium tablet, remaining in her esophagus upon completion of the study (MD not present to confirm). Recommend that she continue with regular solids and thin liquids, using aspiration and esophageal precautions. SLP will sign off acutely and defer any potential esophageal w/u to MD discretion. SLP Visit Diagnosis Dysphagia, pharyngoesophageal phase (R13.14) Attention and concentration deficit following -- Frontal lobe and executive function deficit following -- Impact on safety and function Mild aspiration risk   Treatment Recommendations 01/10/2022 Treatment Recommendations No treatment recommended at this time   Prognosis 01/10/2022 Prognosis for Safe Diet Advancement Good Barriers to Reach Goals -- Barriers/Prognosis Comment -- Diet Recommendations 01/10/2022 SLP Diet Recommendations Regular solids;Thin liquid Liquid Administration via Cup;Straw Medication Administration Whole meds with liquid Compensations Slow rate;Small sips/bites;Follow solids with liquid Postural Changes Remain semi-upright after after feeds/meals (Comment);Seated upright at 90 degrees   Other Recommendations 01/10/2022  Recommended Consults Consider esophageal assessment Oral Care Recommendations Oral care BID Other Recommendations -- Follow Up Recommendations No SLP follow up Assistance recommended at discharge -- Functional Status Assessment -- No flowsheet data found.  Oral Phase 01/10/2022 Oral Phase WFL Oral - Pudding Teaspoon -- Oral - Pudding Cup -- Oral - Honey Teaspoon -- Oral - Honey Cup -- Oral - Nectar Teaspoon -- Oral - Nectar Cup -- Oral - Nectar Straw -- Oral - Thin Teaspoon -- Oral - Thin Cup -- Oral - Thin Straw -- Oral - Puree -- Oral - Mech Soft -- Oral - Regular -- Oral - Multi-Consistency -- Oral - Pill -- Oral Phase - Comment --  Pharyngeal Phase 01/10/2022 Pharyngeal Phase WFL Pharyngeal- Pudding Teaspoon -- Pharyngeal -- Pharyngeal- Pudding Cup -- Pharyngeal -- Pharyngeal- Honey Teaspoon -- Pharyngeal -- Pharyngeal- Honey Cup -- Pharyngeal -- Pharyngeal- Nectar Teaspoon -- Pharyngeal -- Pharyngeal- Nectar Cup -- Pharyngeal -- Pharyngeal- Nectar Straw -- Pharyngeal -- Pharyngeal- Thin Teaspoon -- Pharyngeal -- Pharyngeal- Thin Cup -- Pharyngeal -- Pharyngeal- Thin Straw -- Pharyngeal -- Pharyngeal- Puree -- Pharyngeal -- Pharyngeal- Mechanical Soft -- Pharyngeal -- Pharyngeal- Regular -- Pharyngeal -- Pharyngeal- Multi-consistency -- Pharyngeal -- Pharyngeal- Pill -- Pharyngeal -- Pharyngeal Comment --  Cervical Esophageal Phase  01/10/2022 Cervical Esophageal Phase WFL Pudding Teaspoon -- Pudding Cup -- Honey Teaspoon -- Honey Cup -- Nectar Teaspoon -- Nectar Cup -- Nectar Straw -- Thin Teaspoon -- Thin Cup -- Thin Straw -- Puree -- Mechanical Soft -- Regular -- Multi-consistency -- Pill -- Cervical Esophageal Comment -- Osie Bond., M.A. [facility] Pager [phone] Office [phone] 01/10/2022, 3:23 PM  ECHOCARDIOGRAM COMPLETE  Result Date: 01/09/2022    ECHOCARDIOGRAM REPORT   Patient Name:   KERRA GUILFOIL Date of Exam: 01/09/2022 Medical Rec #:   [id number]         Height:       64.0 in Accession #:    [id number]        Weight:       133.0 lb Date of Birth:  [birth year]         BSA:          1.645 m Patient Age:    29 years          BP:           138/66 mmHg Patient Gender: F                 HR:           93 bpm. Exam Location:  Inpatient Procedure: 2D Echo, Cardiac Doppler and Color Doppler Indications:    Dyspnea  History:        Patient has prior history of Echocardiogram examinations, most                 recent 06/04/2021. Arrythmias:Atrial Fibrillation,                 Signs/Symptoms:Dyspnea; Risk Factors:Hypertension and                 Dyslipidemia. Anemia.  Sonographer:    Clayton Lefort RDCS (AE) Referring Phys: [place]  1. Left ventricular ejection fraction, by estimation, is 50 to 55%. Left ventricular ejection fraction by PLAX is 52 %. The left ventricle has low normal function. The left ventricle has no regional wall motion abnormalities. There is mild left ventricular hypertrophy. Left ventricular diastolic function could not be evaluated. There is the interventricular septum is flattened in systole and diastole, consistent with right ventricular pressure and volume overload.  2. Right ventricular systolic function is moderately reduced. The right ventricular size is mildly enlarged. There is mildly elevated pulmonary artery systolic pressure. The estimated right ventricular systolic pressure is 25.9 mmHg.  3. Right atrial size was moderately dilated.  4. A small pericardial effusion is present. The pericardial effusion is circumferential. There is no evidence of cardiac tamponade.  5. The mitral valve is abnormal. Mild mitral valve regurgitation.  6. Tricuspid valve regurgitation is moderate.  7. The aortic valve is tricuspid. Aortic valve regurgitation is not visualized.  8. The inferior vena cava is normal in size with <50% respiratory variability, suggesting right atrial pressure of 8 mmHg.  9. Rhythm strip during this exam  demostrated atrial fibrillation. Comparison(s): Changes from prior study are noted. 06/14/2021: LVEF 55-60%, normal RV function, moderate biatrial enlargement. FINDINGS  Left Ventricle: Left ventricular ejection fraction, by estimation, is 50 to 55%. Left ventricular ejection fraction by PLAX is 52 %. The left ventricle has low normal function. The left ventricle has no regional wall motion abnormalities. The left ventricular internal cavity size was normal in size. There is mild left ventricular hypertrophy. The interventricular septum is flattened in systole and diastole, consistent with right ventricular pressure and volume overload. Left ventricular diastolic function could not be evaluated due to atrial fibrillation. Left ventricular diastolic function could not be evaluated. Right Ventricle: The right ventricular size is mildly enlarged. No increase in right ventricular wall thickness. Right ventricular systolic function is moderately reduced. There is mildly elevated pulmonary artery systolic pressure. The tricuspid regurgitant velocity  is 3.04 m/s, and with an assumed right atrial pressure of 8 mmHg, the estimated right ventricular systolic pressure is 29.5 mmHg. Left Atrium: Left atrial size was normal in size. Right Atrium: Right atrial size was moderately dilated. Pericardium: A small pericardial effusion is present. The pericardial effusion is circumferential. There is no evidence of cardiac tamponade. Mitral Valve: The mitral valve is abnormal. There is mild thickening of the mitral valve leaflet(s). Mild mitral valve regurgitation, with posteriorly-directed jet. Tricuspid Valve: The tricuspid valve is grossly normal. Tricuspid valve regurgitation is moderate. Aortic Valve: The aortic valve is tricuspid. Aortic valve regurgitation is not visualized. Aortic valve mean gradient measures 2.0 mmHg. Aortic valve peak gradient measures 3.1 mmHg. Aortic valve area, by VTI measures 1.49 cm. Pulmonic Valve: The  pulmonic valve was grossly normal. Pulmonic valve regurgitation is trivial. Aorta: The aortic root and ascending aorta are structurally normal, with no evidence of dilitation. Venous: The inferior vena cava is normal in size with less than 50% respiratory variability, suggesting right atrial pressure of 8 mmHg. IAS/Shunts: No atrial level shunt detected by color flow Doppler. EKG: Rhythm strip during this exam demostrated atrial fibrillation.  LEFT VENTRICLE PLAX 2D LV EF:         Left ventricular ejection fraction by PLAX is 52 %. LVIDd:         3.80 cm LVIDs:         2.80 cm LV PW:         1.10 cm LV IVS:        0.90 cm LVOT diam:     1.60 cm LV SV:         24 LV SV Index:   15 LVOT Area:     2.01 cm  RIGHT VENTRICLE            IVC RV Basal diam:  3.20 cm    IVC diam: 1.60 cm RV S prime:     5.19 cm/s TAPSE (M-mode): 1.0 cm LEFT ATRIUM           Index        RIGHT ATRIUM           Index LA diam:      4.00 cm 2.43 cm/m   RA Area:     23.20 cm LA Vol (A4C): 36.7 ml 22.31 ml/m  RA Volume:   72.30 ml  43.95 ml/m  AORTIC VALVE AV Area (Vmax):    1.49 cm AV Area (Vmean):   1.52 cm AV Area (VTI):     1.49 cm AV Vmax:           87.80 cm/s AV Vmean:          59.000 cm/s AV VTI:            0.163 m AV Peak Grad:      3.1 mmHg AV Mean Grad:      2.0 mmHg LVOT Vmax:         64.90 cm/s LVOT Vmean:        44.700 cm/s LVOT VTI:          0.121 m LVOT/AV VTI ratio: 0.74  AORTA Ao Root diam: 3.20 cm Ao Asc diam:  3.00 cm TRICUSPID VALVE TR Peak grad:   37.0 mmHg TR Vmax:        304.00 cm/s  SHUNTS Systemic VTI:  0.12 m Systemic Diam: 1.60 cm Lyman Bishop MD Electronically signed by Lyman Bishop MD Signature Date/Time: 01/09/2022/5:33:36 PM  Final    Disposition   Pt is being discharged home today in good condition.  Follow-up Plans & [place], [facility] Follow up.   Specialty: Home Health Services Why: Physical Therapy and Registered Nurse-office to  call with visit times. Contact information: [place] [address] [phone]         [name], MD Follow up on 01/29/2022.   Specialty: Cardiology Why: Appointment at 11:40AM Contact information: [place] [address] [phone]         Curtis, [name], NP. Call.   Specialty: Family Medicine Contact information: [name] [address] [phone]         [place] Office Follow up on 01/23/2022.   Specialty: Cardiology Why: Please present at 10:30 for labs. No need to fast before labs are drawn Contact information: [address] [place] [phone]               Discharge Instructions     Diet - low sodium heart healthy   Complete by: As directed    Increase activity slowly   Complete by: As directed        Discharge Medications   Allergies as of 01/17/2022       Reactions   Ciprofloxacin Diarrhea   Sulfa Antibiotics Hives, Rash   Severe Rash   Memantine Other (See Comments)   dizziness   Succinylsulphathiazole Rash   Sulfamethoxazole Rash        Medication List     STOP taking these medications    Alpha-Lipoic Acid 200 MG Tabs   potassium chloride 10 MEQ tablet Commonly known as: KLOR-CON   simvastatin 20 MG tablet Commonly known as: ZOCOR       TAKE these medications    acetaminophen 500 MG tablet Commonly known as: TYLENOL Take 1,000 mg by mouth every 6 (six) hours as needed for mild pain or headache.   alendronate 70 MG tablet Commonly known as: FOSAMAX Take 70 mg by mouth every Monday.   allopurinol 100 MG tablet Commonly known as: ZYLOPRIM Take 1 tablet (100 mg total) by mouth daily. Follow up with primary care provider for refills Start taking on: January 18, 2022   atenolol 50 MG tablet Commonly known as: TENORMIN Take 1 tablet (50 mg total) by mouth 2 (two) times daily. What  changed:  medication strength how much to take how to take this when to take this additional instructions   BIOFREEZE EX Apply 1 application topically daily as needed (pain).   calcium carbonate 1250 (500 Ca) MG tablet Commonly known as: OS-CAL - dosed in mg of elemental calcium Take 1 tablet (1,250 mg total) by mouth in the morning. Start taking on: January 18, 2022 What changed:  medication strength how much to take   cholecalciferol 25 MCG (1000 UNIT) tablet Commonly known as: VITAMIN D3 Take 2,000 Units by mouth daily.   colchicine 0.6 MG tablet Take 1 tablet (0.6 mg total) by mouth daily as needed (As needed for gout flare). Follow up with primary care provider for refill   donepezil 10 MG tablet Commonly known as: ARICEPT Take 1 tablet (10 mg total) by mouth at bedtime.   furosemide 20 MG tablet Commonly known as: LASIX Take 3 tablets (60 mg total) by mouth daily. Start  taking on: January 18, 2022   gabapentin 300 MG capsule Commonly known as: NEURONTIN Take 300 mg by mouth 3 (three) times daily.   ipratropium 0.06 % nasal spray Commonly known as: ATROVENT CAN USE 2 SPRAYS IN EACH NOSTRIL EVERY 6 HOURS IF NEEDED TO DRY UP RUNNY N0SE   Melatonin 10 MG Tabs Take 10-20 mg by mouth at bedtime.   memantine 5 MG tablet Commonly known as: NAMENDA Take 1 tablet (5 mg total) by mouth 2 (two) times daily.   MULTIPLE VITAMINS/WOMENS PO Take 1 tablet by mouth daily.   Myrbetriq 25 MG Tb24 tablet Generic drug: mirabegron ER Take 25 mg by mouth daily.   potassium chloride SA 20 MEQ tablet Commonly known as: KLOR-CON M Take 1 tablet (20 mEq total) by mouth daily. Start taking on: January 18, 2022   rosuvastatin 5 MG tablet Commonly known as: CRESTOR Take 1 tablet (5 mg total) by mouth daily. Start taking on: January 18, 2022   traZODone 50 MG tablet Commonly known as: DESYREL Take 50 mg by mouth at bedtime as needed for sleep.   vitamin B-12 1000 MCG  tablet Commonly known as: CYANOCOBALAMIN Take 1,000 mcg by mouth daily.   vitamin C 250 MG tablet Commonly known as: ASCORBIC ACID Take 250 mg by mouth daily.   Xarelto 15 MG Tabs tablet Generic drug: Rivaroxaban TAKE 1 TABLET (15 MG TOTAL) BY MOUTH DAILY WITH SUPPER.           Outstanding Labs/Studies   BMP at follow up   Duration of Discharge Encounter   Greater than 30 minutes including physician time.  Signed, Margie Billet, PA-C 01/17/2022, 3:09 PM

## 2022-01-17 NOTE — Progress Notes (Addendum)
Progress Note  Patient Name: [name] Dyer Date of Encounter: 01/17/2022  [facility] Cardiologist: [name], MD   Subjective   Patient is much more responsive today than she has been the past two days. Reports that she is uncomfortable in bed and is having trouble breathing due to her positioning. Denies chest pain at rest, has point tenderness to palpation on left chest. Continues to have pain in her feet.   Inpatient Medications    Scheduled Meds:  allopurinol  100 mg Oral Daily   vitamin C  250 mg Oral Daily   atenolol  50 mg Oral BID   calcium carbonate  1,250 mg Oral q AM   colchicine  0.6 mg Oral Daily   donepezil  10 mg Oral QHS   furosemide  60 mg Oral Daily   gabapentin  300 mg Oral TID   melatonin  10 mg Oral QHS   memantine  5 mg Oral BID   potassium chloride  10 mEq Oral Daily   rivaroxaban  15 mg Oral Q supper   simvastatin  10 mg Oral Daily   sodium chloride flush  3 mL Intravenous Q12H   vitamin B-12  1,000 mcg Oral Daily   Continuous Infusions:  sodium chloride     PRN Meds: sodium chloride, acetaminophen, acetaminophen, ipratropium, Muscle Rub, ondansetron (ZOFRAN) IV, sodium chloride flush, traZODone   Vital Signs    Vitals:   01/16/22 0500 01/16/22 1500 01/16/22 2059 01/17/22 0457  BP:  125/64 (!) 130/97 138/75  Pulse:  84 84 84  Resp:  _0 Temp:  98.2 F (36.8 C) 98.2 F (36.8 C) 97.6 F (36.4 C)  TempSrc:  Oral Oral Oral  SpO2:  96% 95% 91%  Weight: 57 kg   57.6 kg  Height:        Intake/Output Summary (Last 24 hours) at 01/17/2022 1002 Last data filed at 01/17/2022 0459 Gross per 24 hour  Intake --  Output 250 ml  Net -250 ml   Last 3 Weights 01/17/2022 01/16/2022 01/15/2022  Weight (lbs) 126 lb 15.8 oz 125 lb 10.6 oz 126 lb 8.7 oz  Weight (kg) 57.6 kg 57 kg 57.4 kg      Telemetry    Atrial fibrillation, rate in the 70s-80s - Personally Reviewed  ECG    No new tracings - Personally Reviewed  Physical Exam    GEN: No acute distress.   Neck: No JVD Cardiac: Irregular, no murmurs, rubs, or gallops.  Tender to palpation over a specific point on left chest.  Respiratory: Clear to auscultation bilaterally. GI: Soft, mildly tender to palpation. MS: No edema; No deformity. Neuro:  Nonfocal  Psych: Normal affect   Labs    High Sensitivity Troponin:  No results for input(s): TROPONINIHS in the last 720 hours.   Chemistry Recent Labs  Lab 01/10/22 1100 01/11/22 0254 01/15/22 0911 01/16/22 0715 01/17/22 0239  NA  --    < > 134* 137 138  K  --    < > 3.7 3.2* 3.4*  CL  --    < > 93* 95* 97*  CO2  --    < > 30 32 28  GLUCOSE  --    < > 131* 100* 100*  BUN  --    < > 45* 47* 51*  CREATININE  --    < > 1.47* 1.66* 1.38*  CALCIUM  --    < > 8.8* 8.5* 8.5*  MG 1.9  --   --   --   --   GFRNONAA  --    < > 34* 30* 37*  ANIONGAP  --    < > _0 < > = values in this interval not displayed.    Lipids No results for input(s): CHOL, TRIG, HDL, LABVLDL, LDLCALC, CHOLHDL in the last 168 hours.  Hematology Recent Labs  Lab 01/15/22 0911 01/16/22 0715 01/17/22 0525  WBC 11.2* 8.9 7.1  RBC 4.30 4.34 4.43  HGB 12.7 12.8 12.9  HCT 39.5 39.4 40.5  MCV 91.9 90.8 91.4  MCH 29.5 29.5 29.1  MCHC 32.2 32.5 31.9  RDW 15.8* 15.7* 15.6*  PLT PLATELET CLUMPS NOTED ON SMEAR, UNABLE TO ESTIMATE PLATELET CLUMPS NOTED ON SMEAR, UNABLE TO ESTIMATE PLATELET CLUMPS NOTED ON SMEAR, UNABLE TO ESTIMATE   Thyroid No results for input(s): TSH, FREET4 in the last 168 hours.  BNPNo results for input(s): BNP, PROBNP in the last 168 hours.  DDimer No results for input(s): DDIMER in the last 168 hours.   Radiology    No results found.  Cardiac Studies   Echo 01/09/22: 1. Left ventricular ejection fraction, by estimation, is 50 to 55%. Left  ventricular ejection fraction by PLAX is 52 %. The left ventricle has low  normal function. The left ventricle has no regional wall motion  abnormalities. There is mild  left  ventricular hypertrophy. Left ventricular diastolic function could not be  evaluated. There is the interventricular septum is flattened in systole  and diastole, consistent with right ventricular pressure and volume  overload.   2. Right ventricular systolic function is moderately reduced. The right  ventricular size is mildly enlarged. There is mildly elevated pulmonary  artery systolic pressure. The estimated right ventricular systolic  pressure is 69.7 mmHg.   3. Right atrial size was moderately dilated.   4. A small pericardial effusion is present. The pericardial effusion is  circumferential. There is no evidence of cardiac tamponade.   5. The mitral valve is abnormal. Mild mitral valve regurgitation.   6. Tricuspid valve regurgitation is moderate.   7. The aortic valve is tricuspid. Aortic valve regurgitation is not  visualized.   8. The inferior vena cava is normal in size with <50% respiratory  variability, suggesting right atrial pressure of 8 mmHg.   9. Rhythm strip during this exam demostrated atrial fibrillation.      Left heart cath 01/08/22:   Mid LAD lesion is 55% stenosed.   Prox Cx lesion is 30% stenosed.   Prox RCA lesion is 45% stenosed.   Hemodynamic findings consistent with severe pulmonary hypertension.(See Below)   POST-OPERATIVE DIAGNOSIS:   Moderate two-vessel CAD with ~55% mid LAD disease and 45 to 50% proximal RCA disease otherwise minimal disease in the rest of the coronary arteries Severe Combined Pulmonary Hypertension -mostly related to Acute on ChronicDiastolic Heart Failure, clearly with underlying pulmonary disease as well: PAP 68/30 mmHg with mean of 49 mmHg; PCWP is 29 to 30 mmHg,             LV P-EDP 120/16 mmHg - 26 mmHg.            RAP 22 mmHg, RVP-EDP 72/15 mmHg - 26 mmHg;             Ao P8-MAP 132/66 mmHg - 94 mmHg            Moderate his Cardiac Function: Cardiac Output-Index 3.69-2.25.  Permanent atrial fibrillation    PLAN OF CARE:  Admit to inpatient   Given her advanced age, borderline renal function, and significant pulmonary hypertension both combined pulmonary and secondary diastolic LV failure with elevated filling pressures, reasonable to admit to the hospital to allow for IV diuresis and possible consideration of milrinone inotropic support.->  I have written for IV Lasix 40 MG bid, however I suspect she may need inotropic assistance.  With setting of atrial fibrillation, would probably recommend milrinone. [place] tonight  Patient Profile     88 y.o. female with a history of chronic diastolic CHF, permanent atrial fibrillation on Xarelto, paroxysmal SVT, hypertension, hyperlipidemia, CKD stage III, COPD, ILD and Alzheimer disease who presented for planned outpatient right/left cardiac catheterization on 01/08/2022. Cath showed elevated filling pressures with cardiac output of 3.69 and cardiac index of 2.25. She was addmitted for IV diuresis for acute on chronic diastolic CHF and pulmonary hypertension.   Assessment & Plan    Acute on chronic diastolic heart failure Severe combined pulmonary hypertension Hypertension - Has been diuresing on 60 mg lasix daily, was transitioned from IV 57m lasix BID to 60 mg PO daily on 1/16. - Appears euvolemic on exam - Complained of tenderness to palpation on chest exam. Tenderness is specific to one place, only present with palpation. Likely musculoskeletal and patient denies chest pain at rest - Weight is 127lbs from 145.94 on 01/10/22. I/Os net -11 L  - Atenolol was reduced to 50 mg BID for pressure. BP and HR are tolerating this well.  - Difficult to evaluate fluid status, creatinine stable at 1.38 (similar to baseline) - K 3.4. Patient was taking either 10 or 20 mEq daily at home while taking 20-366mtorsemide. As patient is now on 60 mg lasix PO daily, will increase daily K supplement to 20 mEq daily  - Has an outpatient appointment scheduled with Dr.  RoHarrington Challengern 01/29/22.  - PT and social work involved with discharge planning. Dr. RoHarrington Challengerlso discussed with son. Plan is discharge home with home health, son is usually with patient during the days, son will arrange night services    Permanent atrial fibrillation  Chronic anticoagulation - Rate controlled (HR in 80s per telemetry).  - Continue atenolol 50 mg BID and xarelto 15 mg daily.   Hyperlipidemia - Continue simvastatin - 12/20/2021: Cholesterol 123; HDL 40; LDL Cholesterol (Calc) 67; Triglycerides 79   Gout  - Flare associated with diureses noted on 1/14  - Given 1.2 mg, 0.6 thereafter  - Continue 0.6 until flare improved  - Started allopurinol on 1/17, will need follow up   CAD  - Cath on admission, moderate 2 vessel CAD with ~55% mid LAD disease and 45 to 50% proximal RCA  - No need for aspirin due to DOAC  - Continue beta-blocker, statin   For questions or updates, please [facility] Please consult [email or web address] for contact info under        Signed, KaMargie BilletPA-C  01/17/2022, 10:02 AM     [name] seen and examined   I agree with findings as noted above by K Sandria SenterPt sitting at side of bed  ABout to walk in hall  Neck  JVP is not elevated Lungs ae CTA   Cardiac  Irreg irreg    Ext are with no significant edema   Pt overall appears OK from a volume standpoint Afib rates are controlled   OK to d/c hoome with close f/u as  outpt  [name] MD

## 2022-01-17 NOTE — Plan of Care (Signed)
Problem: Education: Goal: Understanding of CV disease, CV risk reduction, and recovery process will improve Outcome: Progressing Goal: Individualized Educational Video(s) Outcome: Progressing   Problem: Activity: Goal: Ability to return to baseline activity level will improve Outcome: Progressing   Problem: Cardiovascular: Goal: Ability to achieve and maintain adequate cardiovascular perfusion will improve Outcome: Progressing Goal: Vascular access site(s) Level 0-1 will be maintained Outcome: Progressing   Problem: Health Behavior/Discharge Planning: Goal: Ability to safely manage health-related needs after discharge will improve Outcome: Progressing   Problem: Education: Goal: Knowledge of General Education information will improve Description: Including pain rating scale, medication(s)/side effects and non-pharmacologic comfort measures Outcome: Progressing   Problem: Health Behavior/Discharge Planning: Goal: Ability to manage health-related needs will improve Outcome: Progressing   Problem: Clinical Measurements: Goal: Ability to maintain clinical measurements within normal limits will improve Outcome: Progressing Goal: Will remain free from infection Outcome: Progressing Goal: Diagnostic test results will improve Outcome: Progressing Goal: Respiratory complications will improve Outcome: Progressing Goal: Cardiovascular complication will be avoided Outcome: Progressing   Problem: Activity: Goal: Risk for activity intolerance will decrease Outcome: Progressing   Problem: Nutrition: Goal: Adequate nutrition will be maintained Outcome: Progressing   Problem: Coping: Goal: Level of anxiety will decrease Outcome: Progressing   Problem: Elimination: Goal: Will not experience complications related to bowel motility Outcome: Progressing Goal: Will not experience complications related to urinary retention Outcome: Progressing   Problem: Pain Managment: Goal:  General experience of comfort will improve Outcome: Progressing   Problem: Safety: Goal: Ability to remain free from injury will improve Outcome: Progressing   Problem: Skin Integrity: Goal: Risk for impaired skin integrity will decrease Outcome: Progressing

## 2022-01-17 NOTE — Progress Notes (Signed)
Mobility Specialist Progress Note:   01/17/22 1117  Mobility  Activity Ambulated with assistance in hallway  Level of Assistance Minimal assist, patient does 75% or more  Assistive Device Front wheel walker  Distance Ambulated (ft) 110 ft  Activity Response Tolerated fair  $Mobility charge 1 Mobility   Pt received in bed willing to participate in mobility. Complaints of foot pain. MinA to stand from bed and toilet. Ambulated to BR and was able to have BM. Left in chair with call bell in reach and all needs met.   [facility] Public librarian Phone [phone] Secondary Phone [phone]

## 2022-01-20 ENCOUNTER — Telehealth: Payer: Self-pay | Admitting: *Deleted

## 2022-01-20 ENCOUNTER — Other Ambulatory Visit: Payer: Self-pay | Admitting: *Deleted

## 2022-01-20 MED ORDER — ALENDRONATE SODIUM 70 MG PO TABS: 70.0000 mg | ORAL_TABLET | ORAL | 1 refills | Status: DC

## 2022-01-20 NOTE — Telephone Encounter (Signed)
Transition Care Management Follow-up Telephone Call Date of discharge and from where: 01/17/2022 [place] How have you been since you were released from the hospital? [facility] Any questions or concerns? No  Items Reviewed: Did the pt receive and understand the discharge instructions provided? Yes  Medications obtained and verified? Yes  Other? No  Any new allergies since your discharge? No  Dietary orders reviewed? Yes Do you have support at home? Yes   Home Care and Equipment/Supplies: Were home health services ordered? yes If so, what is the name of the agency? [facility]  Has the agency set up a time to come to the patient's home? yes Were any new equipment or medical supplies ordered?  No What is the name of the medical supply agency? [facility] Were you able to get the supplies/equipment? not applicable Do you have any questions related to the use of the equipment or supplies? No  Functional Questionnaire: (I = Independent and D = Dependent) ADLs: I with Assistance   Bathing/Dressing- I with assistance  Meal Prep- D  Eating- I  Maintaining continence- I  Transferring/Ambulation- I with assistance Caregiver and Walker  Managing Meds- D  Follow up appointments reviewed:  PCP Hospital f/u appt confirmed? Yes  Scheduled to see [name] on 01/22/2022 @ 2:45. [facility] f/u appt confirmed? No   Are transportation arrangements needed? No  If their condition worsens, is the pt aware to call PCP or go to the Emergency Dept.? Yes Was the patient provided with contact information for the PCP's office or ED? Yes Was to pt encouraged to call back with questions or concerns? Yes

## 2022-01-20 NOTE — Telephone Encounter (Signed)
Patient son requested refill. Pended Rx and sent to [facility] for approval due to [place].

## 2022-01-20 NOTE — Telephone Encounter (Signed)
Transition Care Management Unsuccessful Follow-up Telephone Call  Date of discharge and from where:  01/17/2022 [place]  Attempts:  1st Attempt  Reason for unsuccessful TCM follow-up call:  Left voice message to return call.

## 2022-01-21 ENCOUNTER — Telehealth: Payer: Self-pay | Admitting: *Deleted

## 2022-01-21 DIAGNOSIS — G309 Alzheimer's disease, unspecified: Secondary | ICD-10-CM | POA: Diagnosis not present

## 2022-01-21 DIAGNOSIS — I110 Hypertensive heart disease with heart failure: Secondary | ICD-10-CM | POA: Diagnosis not present

## 2022-01-21 DIAGNOSIS — I272 Pulmonary hypertension, unspecified: Secondary | ICD-10-CM | POA: Diagnosis not present

## 2022-01-21 DIAGNOSIS — I081 Rheumatic disorders of both mitral and tricuspid valves: Secondary | ICD-10-CM

## 2022-01-21 DIAGNOSIS — I2510 Atherosclerotic heart disease of native coronary artery without angina pectoris: Secondary | ICD-10-CM

## 2022-01-21 DIAGNOSIS — I5033 Acute on chronic diastolic (congestive) heart failure: Secondary | ICD-10-CM | POA: Diagnosis not present

## 2022-01-21 DIAGNOSIS — I4821 Permanent atrial fibrillation: Secondary | ICD-10-CM

## 2022-01-21 DIAGNOSIS — F0280 Dementia in other diseases classified elsewhere without behavioral disturbance: Secondary | ICD-10-CM

## 2022-01-21 NOTE — Telephone Encounter (Signed)
Okay to approve home health orders.

## 2022-01-21 NOTE — Telephone Encounter (Signed)
Leah with [facility] Notified and agreed.

## 2022-01-21 NOTE — Telephone Encounter (Signed)
Denny Peon with [facility] [phone] called and stated that patient was discharged from hospital with Home Health Referral.   Calling to request verbal orders for:            HHPT 1x8weeks HHAide 2x4week, 1x1week Social Worker Eval Speech Therapy Eval OT Eval.   Patient has a TOC appointment 01/22/22   Is it ok to approve these orders Please Advise.

## 2022-01-22 ENCOUNTER — Other Ambulatory Visit: Payer: Self-pay

## 2022-01-22 ENCOUNTER — Encounter: Payer: Self-pay | Admitting: Family

## 2022-01-22 ENCOUNTER — Ambulatory Visit (INDEPENDENT_AMBULATORY_CARE_PROVIDER_SITE_OTHER): Payer: Medicare Other | Admitting: Family

## 2022-01-22 VITALS — BP 110/60 | HR 56 | Temp 96.9°F | Resp 16 | Ht 64.0 in | Wt 126.6 lb

## 2022-01-22 DIAGNOSIS — I2510 Atherosclerotic heart disease of native coronary artery without angina pectoris: Secondary | ICD-10-CM | POA: Diagnosis not present

## 2022-01-22 DIAGNOSIS — I4821 Permanent atrial fibrillation: Secondary | ICD-10-CM

## 2022-01-22 DIAGNOSIS — I5032 Chronic diastolic (congestive) heart failure: Secondary | ICD-10-CM

## 2022-01-22 DIAGNOSIS — G609 Hereditary and idiopathic neuropathy, unspecified: Secondary | ICD-10-CM

## 2022-01-22 DIAGNOSIS — E782 Mixed hyperlipidemia: Secondary | ICD-10-CM | POA: Diagnosis not present

## 2022-01-22 DIAGNOSIS — I10 Essential (primary) hypertension: Secondary | ICD-10-CM | POA: Diagnosis not present

## 2022-01-22 NOTE — Progress Notes (Signed)
Provider: Marlowe Sax FNP-C  [name], [name], NP  Patient Care Team: [name], [name], NP as PCP - General (Family Medicine) [name], MD as PCP - Cardiology (Cardiology) [name], MD as Consulting Physician (Allergy and Immunology) [name], DPM as Consulting Physician (Podiatry) [name], MD as Consulting Physician (Obstetrics and Gynecology) [name], MD (Inactive) (Neurology) [name], MD as Consulting Physician (Urology)  Extended Emergency Contact Information Primary Emergency Contact: [name] of [place] Phone: [phone] Mobile Phone: [phone] Relation: Son Secondary Emergency Contact: [name],[name]  [place] of [place] Phone: [phone] Mobile Phone: [phone] Relation: Son  Code Status:  Full Code  Goals of care: Advanced Directive information Advanced Directives 01/22/2022  Does Patient Have a Medical Advance Directive? Yes  Type of Paramedic of [place];Living will  Does patient want to make changes to medical advance directive? No - Patient declined  Copy of [place] in Chart? Yes - validated most recent copy scanned in chart (See row information)  Would patient like information on creating a medical advance directive? -     Chief Complaint  Patient presents with   Transitions Of Care    [facility] 01/08/2022-01/17/2022    HPI:  Pt is a 88 y.o. female seen today for an acute visit for Transition of care post hospitalization from 01/08/2022 - 01/17/2022 for shortness of breath.She is here with husband who provides additional HPI information due to patient's memory loss.weight was 145 lbs.she was treated for acute on chronic diastolic heart failure combined with severe pulmonary hypertension.she was diuresed on lasix I.V 80 mg twice daily which was decreased to 60 mg oral daily on discharge.Atenolol increased to 50 mg twice daily.   K+ 3.4 (01/23/2022). Has appointment Dr.Ross Nevin Bloodgood on 01/29/2022   Appetite is good  Weight stable compared to discharge .reports 117 lbs at home  Medication reconciled.    Past Medical History:  Diagnosis Date   Alzheimer disease ([place])    Anemia    Arrhythmia    Cataract    Colon polyps    Community acquired pneumonia 01/2012   LLL   Congestive heart failure ([place])    Hyperlipidemia    Hypertension    Memory difficulty 10/01/2016   Osteoporosis    Pleural effusion, right 01/2012   SVT (supraventricular tachycardia) ([place]) 01/2012   UTI (lower urinary tract infection)    Past Surgical History:  Procedure Laterality Date   [place]   COLONOSCOPY     COLONOSCOPY N/A 06/10/2016   Procedure: COLONOSCOPY;  Surgeon: Manus Gunning, MD;  Location: [facility];  Service: Gastroenterology;  Laterality: N/A;   ESOPHAGOGASTRODUODENOSCOPY N/A 06/10/2016   Procedure: ESOPHAGOGASTRODUODENOSCOPY (EGD);  Surgeon: Manus Gunning, MD;  Location: [facility];  Service: Gastroenterology;  Laterality: N/A;   HEMIARTHROPLASTY HIP  2011   Left femoral neck fracture   RIGHT/LEFT HEART CATH AND CORONARY ANGIOGRAPHY N/A 01/08/2022   Procedure: RIGHT/LEFT HEART CATH AND CORONARY ANGIOGRAPHY;  Surgeon: Leonie Man, MD;  Location: [facility];  Service: Cardiovascular;  Laterality: N/A;   UPPER GASTROINTESTINAL ENDOSCOPY      Allergies  Allergen Reactions   Ciprofloxacin Diarrhea   Sulfa Antibiotics Hives and Rash    Severe Rash   Memantine Other (See Comments)    dizziness   Succinylsulphathiazole Rash   Sulfamethoxazole Rash    Outpatient Encounter Medications as of 01/22/2022  Medication Sig   acetaminophen (TYLENOL) 500 MG tablet Take 1,000 mg by mouth every 6 (six) hours as needed for mild pain or headache.    alendronate (FOSAMAX) 70 MG tablet Take 1 tablet (70 mg total) by mouth once a week.   allopurinol (ZYLOPRIM)  100 MG tablet Take 1 tablet (100 mg total) by mouth daily. Follow up with primary care provider for refills   atenolol (TENORMIN) 50 MG tablet Take 1 tablet (50 mg total) by mouth 2 (two) times daily.   calcium carbonate (OS-CAL - DOSED IN MG OF ELEMENTAL CALCIUM) 1250 (500 Ca) MG tablet Take 1 tablet (1,250 mg total) by mouth in the morning.   cholecalciferol (VITAMIN D3) 25 MCG (1000 UT) tablet Take 2,000 Units by mouth daily.   colchicine 0.6 MG tablet Take 1 tablet (0.6 mg total) by mouth daily as needed (As needed for gout flare). Follow up with primary care provider for refill   donepezil (ARICEPT) 10 MG tablet Take 1 tablet (10 mg total) by mouth at bedtime.   furosemide (LASIX) 20 MG tablet Take 3 tablets (60 mg total) by mouth daily.   gabapentin (NEURONTIN) 300 MG capsule Take 300 mg by mouth 3 (three) times daily.   ipratropium (ATROVENT) 0.06 % nasal spray CAN USE 2 SPRAYS IN EACH NOSTRIL EVERY 6 HOURS IF NEEDED TO DRY UP RUNNY N0SE   Melatonin 10 MG TABS Take 10-20 mg by mouth at bedtime.   memantine (NAMENDA) 5 MG tablet Take 1 tablet (5 mg total) by mouth 2 (two) times daily.   Menthol, Topical Analgesic, (BIOFREEZE EX) Apply 1 application topically daily as needed (pain).   mirabegron ER (MYRBETRIQ) 25 MG TB24 tablet Take 25 mg by mouth daily.   Multiple Vitamins-Minerals (MULTIPLE VITAMINS/WOMENS PO) Take 1 tablet by mouth daily.   potassium chloride SA (KLOR-CON M) 20 MEQ tablet Take 1 tablet (20 mEq total) by mouth daily.   Rivaroxaban (XARELTO) 15 MG TABS tablet TAKE 1 TABLET (15 MG TOTAL) BY MOUTH DAILY WITH SUPPER.   rosuvastatin (CRESTOR) 5 MG tablet Take 1 tablet (5 mg total) by mouth daily.   traZODone (DESYREL) 50 MG tablet Take 50 mg by mouth at bedtime as needed for sleep.   vitamin B-12 (CYANOCOBALAMIN) 1000 MCG tablet Take 1,000 mcg by mouth daily.   vitamin C (ASCORBIC ACID) 250 MG tablet Take 250 mg by mouth daily.   No facility-administered encounter medications  on file as of 01/22/2022.    Review of Systems  Constitutional:  Negative for appetite change, chills, fever and unexpected weight change.       Chronic fatigue   HENT:  Negative for congestion, dental problem, ear discharge, ear pain, facial swelling, hearing loss, nosebleeds, postnasal drip, rhinorrhea, sinus pressure, sinus pain, sneezing, sore throat, tinnitus and trouble swallowing.   Eyes:  Negative for pain, discharge, redness, itching and visual disturbance.  Respiratory:  Negative for cough, chest tightness, shortness of breath and wheezing.   Cardiovascular:  Negative for chest pain, palpitations and leg swelling.  Gastrointestinal:  Negative for abdominal distention, abdominal pain, blood in stool, constipation, diarrhea, nausea and vomiting.  Endocrine: Negative for cold intolerance, heat intolerance, polydipsia, polyphagia and polyuria.  Genitourinary:  Negative for difficulty urinating, dysuria, flank pain, frequency and urgency.  Musculoskeletal:  Positive for gait problem. Negative for arthralgias, back pain, joint swelling, myalgias, neck pain and neck stiffness.  Skin:  Negative for color change, pallor, rash and wound.  Neurological:  Positive for numbness.  Negative for dizziness, syncope, speech difficulty, weakness, light-headedness and headaches.       Neuropathy   Hematological:  Does not bruise/bleed easily.  Psychiatric/Behavioral:  Negative for agitation, behavioral problems, confusion, hallucinations, self-injury, sleep disturbance and suicidal ideas. The patient is not nervous/anxious.    Immunization History  Administered Date(s) Administered   Influenza, High Dose Seasonal PF 11/19/2018   Influenza,inj,Quad PF,6+ Mos 11/17/2018   Influenza,inj,quad, With Preservative 10/29/2017   Influenza-Unspecified 10/29/2021   Pertinent  Health Maintenance Due  Topic Date Due   DEXA SCAN  Never done   INFLUENZA VACCINE  Completed   Fall Risk 01/14/2022 01/15/2022  01/15/2022 01/17/2022 01/22/2022  Falls in the past year? - - - - 0  Was there an injury with Fall? - - - - 0  Fall Risk Category Calculator - - - - 0  Fall Risk Category - - - - Low  Patient Fall Risk Level High fall risk High fall risk High fall risk High fall risk Low fall risk  Patient at Risk for Falls Due to - - - - No Fall Risks  Fall risk Follow up - - - - Falls evaluation completed   Functional Status Survey:    Vitals:   01/22/22 1505  BP: 110/60  Pulse: (!) 56  Resp: 16  Temp: (!) 96.9 F (36.1 C)  SpO2: 95%  Weight: 126 lb 9.6 oz (57.4 kg)  Height: _0  (1.626 m)   Body mass index is 21.73 kg/m. Physical Exam Vitals reviewed.  Constitutional:      General: She is not in acute distress.    Appearance: Normal appearance. She is normal weight. She is not ill-appearing or diaphoretic.  HENT:     Head: Normocephalic.     Right Ear: Tympanic membrane, ear canal and external ear normal. There is no impacted cerumen.     Left Ear: Tympanic membrane, ear canal and external ear normal. There is no impacted cerumen.     Nose: Nose normal. No congestion or rhinorrhea.     Mouth/Throat:     Mouth: Mucous membranes are moist.     Pharynx: Oropharynx is clear. No oropharyngeal exudate or posterior oropharyngeal erythema.  Eyes:     General: No scleral icterus.       Right eye: No discharge.        Left eye: No discharge.     Extraocular Movements: Extraocular movements intact.     Conjunctiva/sclera: Conjunctivae normal.     Pupils: Pupils are equal, round, and reactive to light.  Neck:     Vascular: No carotid bruit.  Cardiovascular:     Rate and Rhythm: Normal rate and regular rhythm.     Pulses: Normal pulses.     Heart sounds: Normal heart sounds. No murmur heard.   No friction rub. No gallop.  Pulmonary:     Effort: Pulmonary effort is normal. No respiratory distress.     Breath sounds: Normal breath sounds. No wheezing, rhonchi or rales.  Chest:     Chest  wall: No tenderness.  Abdominal:     General: Bowel sounds are normal. There is no distension.     Palpations: Abdomen is soft. There is no mass.     Tenderness: There is no abdominal tenderness. There is no right CVA tenderness, left CVA tenderness, guarding or rebound.  Musculoskeletal:        General: No swelling or tenderness. Normal range of motion.     Cervical back: Normal  range of motion. No rigidity or tenderness.     Right lower leg: No edema.     Left lower leg: No edema.     Comments: Unsteady gait   Lymphadenopathy:     Cervical: No cervical adenopathy.  Skin:    General: Skin is warm and dry.     Coloration: Skin is not pale.     Findings: No bruising, erythema, lesion or rash.  Neurological:     Mental Status: She is alert and oriented to person, place, and time.     Cranial Nerves: No cranial nerve deficit.     Sensory: No sensory deficit.     Motor: No weakness.     Coordination: Coordination normal.     Gait: Gait abnormal.  Psychiatric:        Mood and Affect: Mood normal.        Speech: Speech normal.        Behavior: Behavior normal.        Thought Content: Thought content normal.        Judgment: Judgment normal.    Labs reviewed: Recent Labs    01/10/22 1100 01/11/22 0254 01/15/22 0911 01/16/22 0715 01/17/22 0239  NA  --    < > 134* 137 138  K  --    < > 3.7 3.2* 3.4*  CL  --    < > 93* 95* 97*  CO2  --    < > 30 32 28  GLUCOSE  --    < > 131* 100* 100*  BUN  --    < > 45* 47* 51*  CREATININE  --    < > 1.47* 1.66* 1.38*  CALCIUM  --    < > 8.8* 8.5* 8.5*  MG 1.9  --   --   --   --    < > = values in this interval not displayed.   Recent Labs    05/17/21 1411 01/09/22 0253  AST 30 26  ALT 32 21  ALKPHOS 137* 99  BILITOT 0.7 1.3*  PROT 6.7 5.6*  ALBUMIN 4.5 3.1*   Recent Labs    12/20/21 1013 12/20/21 1013 12/25/21 1428 01/08/22 1313 01/15/22 0911 01/16/22 0715 01/17/22 0525  WBC 7.2  --  6.6  --  11.2* 8.9 7.1  NEUTROABS  5,234  --  5,082  --  9.1*  --   --   HGB 12.6  --  12.8   < > 12.7 12.8 12.9  HCT 40.0  --  40.2   < > 39.5 39.4 40.5  MCV 93.5  --  93.3  --  91.9 90.8 91.4  PLT  --    < > 161  --  PLATELET CLUMPS NOTED ON SMEAR, UNABLE TO ESTIMATE PLATELET CLUMPS NOTED ON SMEAR, UNABLE TO ESTIMATE PLATELET CLUMPS NOTED ON SMEAR, UNABLE TO ESTIMATE   < > = values in this interval not displayed.   Lab Results  Component Value Date   TSH 6.12 (H) 12/20/2021   No results found for: HGBA1C Lab Results  Component Value Date   CHOL 123 12/20/2021   HDL 40 (L) 12/20/2021   LDLCALC 67 12/20/2021   TRIG 79 12/20/2021   CHOLHDL 3.1 12/20/2021    Significant Diagnostic Results in last 30 days:  CARDIAC CATHETERIZATION  Result Date: 01/08/2022   Mid LAD lesion is 55% stenosed.   Prox Cx lesion is 30% stenosed.   Prox RCA lesion is 45% stenosed.  Hemodynamic findings consistent with severe pulmonary hypertension.(See Below) POST-OPERATIVE DIAGNOSIS:  Moderate two-vessel CAD with ~55% mid LAD disease and 45 to 50% proximal RCA disease otherwise minimal disease in the rest of the coronary arteries Severe Combined Pulmonary Hypertension -mostly related to Acute on ChronicDiastolic Heart Failure, clearly with underlying pulmonary disease as well: PAP 68/30 mmHg with mean of 49 mmHg; PCWP is 29 to 30 mmHg,  LV P-EDP 120/16 mmHg - 26 mmHg.  RAP 22 mmHg, RVP-EDP 72/15 mmHg - 26 mmHg;  Ao P8-MAP 132/66 mmHg - 94 mmHg  Moderate his Cardiac Function: Cardiac Output-Index 3.69-2.25.  Permanent atrial fibrillation PLAN OF CARE: Admit to inpatient  Given her advanced age, borderline renal function, and significant pulmonary hypertension both combined pulmonary and secondary diastolic LV failure with elevated filling pressures, reasonable to admit to the hospital to allow for IV diuresis and possible consideration of milrinone inotropic support.-> I have written for IV Lasix 40 MG bid, however I suspect she may need  inotropic assistance.  With setting of atrial fibrillation, would probably recommend milrinone. Restart Xarelto tonight Glenetta Hew, MD  DG CHEST PORT 1 VIEW  Result Date: 01/09/2022 CLINICAL DATA:  Shortness of breath.  Edema. EXAM: PORTABLE CHEST 1 VIEW COMPARISON:  05/05/2018 FINDINGS: Cardiac silhouette is moderately enlarged, and this appears unchanged to mildly worsened from prior. Mediastinal contours are grossly within normal limits. Decreased lung volumes. New mild-to-moderate bilateral mid and lower lung interstitial thickening, greatest at the right lung base. Mild right-greater-than-left basilar heterogeneous airspace opacity. No large pleural effusion. No pneumothorax. 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 Moderate multilevel degenerative osteophytes of the thoracic spine. IMPRESSION:: IMPRESSION: 1. Moderate enlargement of the cardiac silhouette, unchanged to minimally worsened from prior noting PA technique on the prior study and AP technique on the current study. 2. Mild bilateral interstitial pulmonary edema. 3. Mild right-greater-than-left basilar heterogeneous airspace opacities, possible alveolar pulmonary edema versus atelectasis. Electronically Signed   By: Yvonne Kendall   On: 01/09/2022 14:04   DG Swallowing Func-Speech Pathology  Result Date: 01/10/2022 Table formatting from the original result was not included. Objective Swallowing Evaluation: Type of Study: MBS-Modified Barium Swallow Study  Patient Details Name: RICKELLE SYLVESTRE MRN: [id number] Date of Birth: 1932/03/14 Today's Date: 01/10/2022 Time: SLP Start Time (ACUTE ONLY): 46 -SLP Stop Time (ACUTE ONLY): 7846 SLP Time Calculation (min) (ACUTE ONLY): 9 min Past Medical History: Past Medical History: Diagnosis Date  Alzheimer disease ([place])   Anemia   Arrhythmia   Cataract   Colon polyps   Community acquired pneumonia 01/2012  LLL  Congestive heart failure ([place])   Hyperlipidemia   Hypertension   Memory  difficulty 10/01/2016  Osteoporosis   Pleural effusion, right 01/2012  SVT (supraventricular tachycardia) ([place]) 01/2012  UTI (lower urinary tract infection)  Past Surgical History: Past Surgical History: Procedure Laterality Date  [place]  COLONOSCOPY    COLONOSCOPY N/A 06/10/2016  Procedure: COLONOSCOPY;  Surgeon: Manus Gunning, MD;  Location: [facility];  Service: Gastroenterology;  Laterality: N/A;  ESOPHAGOGASTRODUODENOSCOPY N/A 06/10/2016  Procedure: ESOPHAGOGASTRODUODENOSCOPY (EGD);  Surgeon: Manus Gunning, MD;  Location: [facility];  Service: Gastroenterology;  Laterality: N/A;  HEMIARTHROPLASTY HIP  2011  Left femoral neck fracture  RIGHT/LEFT HEART CATH AND CORONARY ANGIOGRAPHY N/A 01/08/2022  Procedure: RIGHT/LEFT HEART CATH AND CORONARY ANGIOGRAPHY;  Surgeon: Leonie Man, MD;  Location: [facility];  Service: Cardiovascular;  Laterality: N/A;  UPPER GASTROINTESTINAL ENDOSCOPY  HPI: SUEZETTE LAFAVE is a 88 y.o. female with hx of atrial fib chronic diastolic CHF, MR, HTN, HL and admit 01/2020 with CHF who is being seen 01/09/2022 for the evaluation of CHF.  Subjective: believes her symptoms are getting worse  Recommendations for follow up therapy are one component of a multi-disciplinary discharge planning process, led by the attending physician.  Recommendations may be updated based on patient status, additional functional criteria and insurance authorization. Assessment / Plan / Recommendation Clinical Impressions 01/10/2022 Clinical Impression [name] oropharyngeal swallowing is functional with adequate efficiency and no aspiration. She had intermittent throat clearing and coughing throughout the study despite no aspiration occurring. She did appear to have barium, including barium tablet, remaining in her esophagus upon completion of the study (MD not present to confirm). Recommend that she continue with regular solids and thin  liquids, using aspiration and esophageal precautions. SLP will sign off acutely and defer any potential esophageal w/u to MD discretion. SLP Visit Diagnosis Dysphagia, pharyngoesophageal phase (R13.14) Attention and concentration deficit following -- Frontal lobe and executive function deficit following -- Impact on safety and function Mild aspiration risk   Treatment Recommendations 01/10/2022 Treatment Recommendations No treatment recommended at this time   Prognosis 01/10/2022 Prognosis for Safe Diet Advancement Good Barriers to Reach Goals -- Barriers/Prognosis Comment -- Diet Recommendations 01/10/2022 SLP Diet Recommendations Regular solids;Thin liquid Liquid Administration via Cup;Straw Medication Administration Whole meds with liquid Compensations Slow rate;Small sips/bites;Follow solids with liquid Postural Changes Remain semi-upright after after feeds/meals (Comment);Seated upright at 90 degrees   Other Recommendations 01/10/2022 Recommended Consults Consider esophageal assessment Oral Care Recommendations Oral care BID Other Recommendations -- Follow Up Recommendations No SLP follow up Assistance recommended at discharge -- Functional Status Assessment -- No flowsheet data found.  Oral Phase 01/10/2022 Oral Phase WFL Oral - Pudding Teaspoon -- Oral - Pudding Cup -- Oral - Honey Teaspoon -- Oral - Honey Cup -- Oral - Nectar Teaspoon -- Oral - Nectar Cup -- Oral - Nectar Straw -- Oral - Thin Teaspoon -- Oral - Thin Cup -- Oral - Thin Straw -- Oral - Puree -- Oral - Mech Soft -- Oral - Regular -- Oral - Multi-Consistency -- Oral - Pill -- Oral Phase - Comment --  Pharyngeal Phase 01/10/2022 Pharyngeal Phase WFL Pharyngeal- Pudding Teaspoon -- Pharyngeal -- Pharyngeal- Pudding Cup -- Pharyngeal -- Pharyngeal- Honey Teaspoon -- Pharyngeal -- Pharyngeal- Honey Cup -- Pharyngeal -- Pharyngeal- Nectar Teaspoon -- Pharyngeal -- Pharyngeal- Nectar Cup -- Pharyngeal -- Pharyngeal- Nectar Straw -- Pharyngeal -- Pharyngeal-  Thin Teaspoon -- Pharyngeal -- Pharyngeal- Thin Cup -- Pharyngeal -- Pharyngeal- Thin Straw -- Pharyngeal -- Pharyngeal- Puree -- Pharyngeal -- Pharyngeal- Mechanical Soft -- Pharyngeal -- Pharyngeal- Regular -- Pharyngeal -- Pharyngeal- Multi-consistency -- Pharyngeal -- Pharyngeal- Pill -- Pharyngeal -- Pharyngeal Comment --  Cervical Esophageal Phase  01/10/2022 Cervical Esophageal Phase WFL Pudding Teaspoon -- Pudding Cup -- Honey Teaspoon -- Honey Cup -- Nectar Teaspoon -- Nectar Cup -- Nectar Straw -- Thin Teaspoon -- Thin Cup -- Thin Straw -- Puree -- Mechanical Soft -- Regular -- Multi-consistency -- Pill -- Cervical Esophageal Comment -- Osie Bond., M.A. [place] [facility] Pager [phone] Office [phone] 01/10/2022, 3:23 PM                     ECHOCARDIOGRAM COMPLETE  Result Date: 01/09/2022    ECHOCARDIOGRAM REPORT   Patient Name:   [name] Dyer Date of Exam: 01/09/2022 Medical Rec #:  [id number]         Height:       64.0 in Accession #:    [id number]        Weight:       133.0 lb Date of Birth:  1932/02/06         BSA:          1.645 m Patient Age:    73 years          BP:           138/66 mmHg Patient Gender: F                 HR:           93 bpm. Exam Location:  Inpatient Procedure: 2D Echo, Cardiac Doppler and Color Doppler Indications:    Dyspnea  History:        Patient has prior history of Echocardiogram examinations, most                 recent 06/04/2021. Arrythmias:Atrial Fibrillation,                 Signs/Symptoms:Dyspnea; Risk Factors:Hypertension and                 Dyslipidemia. Anemia.  Sonographer:    Clayton Lefort RDCS (AE) Referring Phys: [place]  1. Left ventricular ejection fraction, by estimation, is 50 to 55%. Left ventricular ejection fraction by PLAX is 52 %. The left ventricle has low normal function. The left ventricle has no regional wall motion abnormalities. There is mild left ventricular hypertrophy. Left ventricular  diastolic function could not be evaluated. There is the interventricular septum is flattened in systole and diastole, consistent with right ventricular pressure and volume overload.  2. Right ventricular systolic function is moderately reduced. The right ventricular size is mildly enlarged. There is mildly elevated pulmonary artery systolic pressure. The estimated right ventricular systolic pressure is 11.9 mmHg.  3. Right atrial size was moderately dilated.  4. A small pericardial effusion is present. The pericardial effusion is circumferential. There is no evidence of cardiac tamponade.  5. The mitral valve is abnormal. Mild mitral valve regurgitation.  6. Tricuspid valve regurgitation is moderate.  7. The aortic valve is tricuspid. Aortic valve regurgitation is not visualized.  8. The inferior vena cava is normal in size with <50% respiratory variability, suggesting right atrial pressure of 8 mmHg.  9. Rhythm strip during this exam demostrated atrial fibrillation. Comparison(s): Changes from prior study are noted. 06/14/2021: LVEF 55-60%, normal RV function, moderate biatrial enlargement. FINDINGS  Left Ventricle: Left ventricular ejection fraction, by estimation, is 50 to 55%. Left ventricular ejection fraction by PLAX is 52 %. The left ventricle has low normal function. The left ventricle has no regional wall motion abnormalities. The left ventricular internal cavity size was normal in size. There is mild left ventricular hypertrophy. The interventricular septum is flattened in systole and diastole, consistent with right ventricular pressure and volume overload. Left ventricular diastolic function could not be evaluated due to atrial fibrillation. Left ventricular diastolic function could not be evaluated. Right Ventricle: The right ventricular size is mildly enlarged. No increase in right ventricular wall thickness. Right ventricular systolic function is moderately reduced. There is mildly elevated pulmonary  artery systolic pressure. The tricuspid regurgitant velocity is 3.04 m/s, and with an assumed right atrial pressure of 8 mmHg, the estimated right ventricular systolic pressure is 41.7 mmHg. Left Atrium: Left atrial size was  normal in size. Right Atrium: Right atrial size was moderately dilated. Pericardium: A small pericardial effusion is present. The pericardial effusion is circumferential. There is no evidence of cardiac tamponade. Mitral Valve: The mitral valve is abnormal. There is mild thickening of the mitral valve leaflet(s). Mild mitral valve regurgitation, with posteriorly-directed jet. Tricuspid Valve: The tricuspid valve is grossly normal. Tricuspid valve regurgitation is moderate. Aortic Valve: The aortic valve is tricuspid. Aortic valve regurgitation is not visualized. Aortic valve mean gradient measures 2.0 mmHg. Aortic valve peak gradient measures 3.1 mmHg. Aortic valve area, by VTI measures 1.49 cm. Pulmonic Valve: The pulmonic valve was grossly normal. Pulmonic valve regurgitation is trivial. Aorta: The aortic root and ascending aorta are structurally normal, with no evidence of dilitation. Venous: The inferior vena cava is normal in size with less than 50% respiratory variability, suggesting right atrial pressure of 8 mmHg. IAS/Shunts: No atrial level shunt detected by color flow Doppler. EKG: Rhythm strip during this exam demostrated atrial fibrillation.  LEFT VENTRICLE PLAX 2D LV EF:         Left ventricular ejection fraction by PLAX is 52 %. LVIDd:         3.80 cm LVIDs:         2.80 cm LV PW:         1.10 cm LV IVS:        0.90 cm LVOT diam:     1.60 cm LV SV:         24 LV SV Index:   15 LVOT Area:     2.01 cm  RIGHT VENTRICLE            IVC RV Basal diam:  3.20 cm    IVC diam: 1.60 cm RV S prime:     5.19 cm/s TAPSE (M-mode): 1.0 cm LEFT ATRIUM           Index        RIGHT ATRIUM           Index LA diam:      4.00 cm 2.43 cm/m   RA Area:     23.20 cm LA Vol (A4C): 36.7 ml 22.31 ml/m  RA  Volume:   72.30 ml  43.95 ml/m  AORTIC VALVE AV Area (Vmax):    1.49 cm AV Area (Vmean):   1.52 cm AV Area (VTI):     1.49 cm AV Vmax:           87.80 cm/s AV Vmean:          59.000 cm/s AV VTI:            0.163 m AV Peak Grad:      3.1 mmHg AV Mean Grad:      2.0 mmHg LVOT Vmax:         64.90 cm/s LVOT Vmean:        44.700 cm/s LVOT VTI:          0.121 m LVOT/AV VTI ratio: 0.74  AORTA Ao Root diam: 3.20 cm Ao Asc diam:  3.00 cm TRICUSPID VALVE TR Peak grad:   37.0 mmHg TR Vmax:        304.00 cm/s  SHUNTS Systemic VTI:  0.12 m Systemic Diam: 1.60 cm Lyman Bishop MD Electronically signed by Lyman Bishop MD Signature Date/Time: 01/09/2022/5:33:36 PM    Final     Assessment/Plan 1. Chronic diastolic CHF (congestive heart failure) (HCC) No signs of fluid overload  Continue on Furosemide   2.  Essential hypertension B/p well controlled  - continue on atenolol   3. Mixed hyperlipidemia LDL at goal  Continue on rosuvastatin   4. Coronary artery disease involving native coronary artery of native heart without angina pectoris Chest pain free Continue to control high risk factors   5. Permanent atrial fibrillation (HCC) - continue on Xarelto for anticoagulation  - continue on atenolol for rate control  6. Idiopathic peripheral neuropathy Continue on Gabapentin   Family/ staff Communication: Reviewed plan of care with patient verbalized understanding   Labs/tests ordered: None   Next Appointment: As needed if symptoms worsen or fail to improve    [name], NP

## 2022-01-23 ENCOUNTER — Other Ambulatory Visit: Payer: Medicare Other

## 2022-01-23 DIAGNOSIS — I5032 Chronic diastolic (congestive) heart failure: Secondary | ICD-10-CM

## 2022-01-23 LAB — BASIC METABOLIC PANEL
BUN/Creatinine Ratio: 27 (ref 12–28)
BUN: 42 mg/dL — ABNORMAL HIGH (ref 8–27)
CO2: 27 mmol/L (ref 20–29)
Calcium: 9.4 mg/dL (ref 8.7–10.3)
Chloride: 99 mmol/L (ref 96–106)
Creatinine, Ser: 1.56 mg/dL — ABNORMAL HIGH (ref 0.57–1.00)
Glucose: 102 mg/dL — ABNORMAL HIGH (ref 70–99)
Potassium: 4.5 mmol/L (ref 3.5–5.2)
Sodium: 137 mmol/L (ref 134–144)
eGFR: 32 mL/min/{1.73_m2} — ABNORMAL LOW (ref >59)

## 2022-01-24 ENCOUNTER — Telehealth: Payer: Self-pay | Admitting: *Deleted

## 2022-01-24 NOTE — Telephone Encounter (Signed)
Sonia Baller ST with [facility] called requesting verbal orders for ST 1x4weeks  Verbal orders given.

## 2022-01-24 NOTE — Telephone Encounter (Signed)
Colletta Maryland OT with [facility] called requesting verbal orders for OT 1x5weeks.   Verbal orders given.

## 2022-01-27 ENCOUNTER — Other Ambulatory Visit: Payer: Self-pay

## 2022-01-27 MED ORDER — FUROSEMIDE 20 MG PO TABS: ORAL_TABLET | ORAL | 5 refills | Status: DC

## 2022-01-29 ENCOUNTER — Ambulatory Visit (INDEPENDENT_AMBULATORY_CARE_PROVIDER_SITE_OTHER): Payer: Medicare Other | Admitting: Internal Medicine

## 2022-01-29 ENCOUNTER — Encounter: Payer: Self-pay | Admitting: Internal Medicine

## 2022-01-29 ENCOUNTER — Other Ambulatory Visit: Payer: Self-pay

## 2022-01-29 VITALS — BP 120/74 | HR 64 | Resp 18 | Ht 64.0 in | Wt 128.4 lb

## 2022-01-29 DIAGNOSIS — N184 Chronic kidney disease, stage 4 (severe): Secondary | ICD-10-CM | POA: Diagnosis not present

## 2022-01-29 DIAGNOSIS — Z79899 Other long term (current) drug therapy: Secondary | ICD-10-CM

## 2022-01-29 DIAGNOSIS — I10 Essential (primary) hypertension: Secondary | ICD-10-CM | POA: Diagnosis not present

## 2022-01-29 DIAGNOSIS — I5032 Chronic diastolic (congestive) heart failure: Secondary | ICD-10-CM | POA: Diagnosis not present

## 2022-01-29 MED ORDER — MIRABEGRON ER 25 MG PO TB24: 25.0000 mg | ORAL_TABLET | Freq: Every day | ORAL | Status: DC

## 2022-01-29 MED ORDER — FUROSEMIDE 40 MG PO TABS: ORAL_TABLET | ORAL | 3 refills | Status: DC

## 2022-01-29 NOTE — Progress Notes (Signed)
Cardiology Office Note   Date:  01/29/2022   ID:  [name] Dyer, DOB [birth year], MRN [id number]  PCP:  [name] Hughs, NP  Cardiologist:   [name] Carnes, MD   Pt presents for f/u of diastolic CHF      History of Present Illness: [name] Dyer is a 88 y.o. female with a history of atrial fibrillation, chronic diastolic CHF, mitral regurgitation, HTN and HL   She was admitted in Feb 2021 with CHF   Echo showed LVEF 55 to 60%; mild to mod MR   Since spring 2022 she had been having more problems with SOB, wt gain   Adjusted lasix   Added Jardiance.   Echo showed LVEF and RVEF were normal    Contniued to adjust meds in the fall (torsemide) adjusting depending on wts and labs   The pt was seen by [name] in December   Felt to be dry   Diuretics held    She continued to have SOB    ReDs vest was placed   Read out at 35    She continued to complain of SOB with this recomm R and L heart cath to confirm anatomy and pressures    This was done on Jan 08, 2022   L heart cath showed no significant CAD    R heart cath showed severely elevated filling pressuress   She was therefore admitted for diuresiss  The pt is curretnly taking 20 tid alternating with 20 mg bid (not 60 and 40 as prescribed   Her weight has gone up    She gets tired, gives out  Current Meds  Medication Sig   acetaminophen (TYLENOL) 500 MG tablet Take 1,000 mg by mouth every 6 (six) hours as needed for mild pain or headache.    alendronate (FOSAMAX) 70 MG tablet Take 1 tablet (70 mg total) by mouth once a week.   allopurinol (ZYLOPRIM) 100 MG tablet Take 1 tablet (100 mg total) by mouth daily. Follow up with primary care provider for refills   atenolol (TENORMIN) 50 MG tablet Take 1 tablet (50 mg total) by mouth 2 (two) times daily.   calcium carbonate (OS-CAL - DOSED IN MG OF ELEMENTAL CALCIUM) 1250 (500 Ca) MG tablet Take 1 tablet (1,250 mg total) by mouth in the morning.   cholecalciferol (VITAMIN D3) 25 MCG (1000 UT) tablet  Take 2,000 Units by mouth daily.   colchicine 0.6 MG tablet Take 1 tablet (0.6 mg total) by mouth daily as needed (As needed for gout flare). Follow up with primary care provider for refill   donepezil (ARICEPT) 10 MG tablet Take 1 tablet (10 mg total) by mouth at bedtime.   furosemide (LASIX) 20 MG tablet Take one tablet three times a day alternating with one tablet twice a day every other day.   gabapentin (NEURONTIN) 300 MG capsule Take 300 mg by mouth 3 (three) times daily.   ipratropium (ATROVENT) 0.06 % nasal spray CAN USE 2 SPRAYS IN EACH NOSTRIL EVERY 6 HOURS IF NEEDED TO DRY UP RUNNY N0SE   Melatonin 10 MG TABS Take 10-20 mg by mouth at bedtime.   memantine (NAMENDA) 5 MG tablet Take 1 tablet (5 mg total) by mouth 2 (two) times daily.   Menthol, Topical Analgesic, (BIOFREEZE EX) Apply 1 application topically daily as needed (pain).   mirabegron ER (MYRBETRIQ) 25 MG TB24 tablet Take 25 mg by mouth daily.   Multiple Vitamins-Minerals (MULTIPLE VITAMINS/WOMENS PO) Take 1  tablet by mouth daily.   potassium chloride SA (KLOR-CON M) 20 MEQ tablet Take 1 tablet (20 mEq total) by mouth daily.   Rivaroxaban (XARELTO) 15 MG TABS tablet TAKE 1 TABLET (15 MG TOTAL) BY MOUTH DAILY WITH SUPPER.   rosuvastatin (CRESTOR) 5 MG tablet Take 1 tablet (5 mg total) by mouth daily.   traZODone (DESYREL) 50 MG tablet Take 50 mg by mouth at bedtime as needed for sleep.   vitamin B-12 (CYANOCOBALAMIN) 1000 MCG tablet Take 1,000 mcg by mouth daily.   vitamin C (ASCORBIC ACID) 250 MG tablet Take 250 mg by mouth daily.     Allergies:   Ciprofloxacin, Sulfa antibiotics, Memantine, Succinylsulphathiazole, and Sulfamethoxazole   Past Medical History:  Diagnosis Date   Alzheimer disease ([place])    Anemia    Arrhythmia    Cataract    Colon polyps    Community acquired pneumonia 01/2012   LLL   Congestive heart failure ([place])    Hyperlipidemia    Hypertension    Memory difficulty 10/01/2016   Osteoporosis     Pleural effusion, right 01/2012   SVT (supraventricular tachycardia) ([place]) 01/2012   UTI (lower urinary tract infection)     Past Surgical History:  Procedure Laterality Date   [place]   COLONOSCOPY     COLONOSCOPY N/A 06/10/2016   Procedure: COLONOSCOPY;  Surgeon: Manus Gunning, MD;  Location: [facility];  Service: Gastroenterology;  Laterality: N/A;   ESOPHAGOGASTRODUODENOSCOPY N/A 06/10/2016   Procedure: ESOPHAGOGASTRODUODENOSCOPY (EGD);  Surgeon: Manus Gunning, MD;  Location: [facility];  Service: Gastroenterology;  Laterality: N/A;   HEMIARTHROPLASTY HIP  2011   Left femoral neck fracture   RIGHT/LEFT HEART CATH AND CORONARY ANGIOGRAPHY N/A 01/08/2022   Procedure: RIGHT/LEFT HEART CATH AND CORONARY ANGIOGRAPHY;  Surgeon: Leonie Man, MD;  Location: [facility];  Service: Cardiovascular;  Laterality: N/A;   UPPER GASTROINTESTINAL ENDOSCOPY       Social History:  The patient  reports that she has never smoked. She has never used smokeless tobacco. She reports that she does not drink alcohol and does not use drugs.   Family History:  The patient's family history includes Cancer in her grandchild; Colon polyps in her son; Heart Problems in her father and mother; Heart disease in her son; Neuropathy in her son; Osteoporosis in her mother; Other (age of onset: 46) in her brother; Parkinson's disease in her son; Stroke in her father.    ROS:  Please see the history of present illness. All other systems are reviewed and  Negative to the above problem except as noted.    PHYSICAL EXAM: VS:  BP 120/74 (BP Location: Right Arm, Patient Position: Sitting, Cuff Size: Normal)    Pulse 64    Resp 18    Ht _0  (1.626 m)    Wt 128 lb 6.4 oz (58.2 kg)    BMI 22.04 kg/m   GEN: Patient is in no acute distress  HEENT: normal  Neck: JVP is not increased   Cardiac: Irreg irreg  ; no murmurs;   1+  edema. R greater than left   Respiratory:  clear to auscultation bilaterally,  GI: soft, nontender, nondistended, + BS  MS: no deformity Moving all extremities   Skin: warm and dry Neuro:   Grossly intact Psych: euthymic mood, full affect   EKG:  EKG was not  ordered today.   Lipid Panel  Component Value Date/Time   CHOL 123 12/20/2021 1013   CHOL 140 05/17/2021 1411   TRIG 79 12/20/2021 1013   HDL 40 (L) 12/20/2021 1013   HDL 45 05/17/2021 1411   CHOLHDL 3.1 12/20/2021 1013   LDLCALC 67 12/20/2021 1013      Wt Readings from Last 3 Encounters:  01/29/22 128 lb 6.4 oz (58.2 kg)  01/22/22 126 lb 9.6 oz (57.4 kg)  01/17/22 126 lb 15.8 oz (57.6 kg)      ASSESSMENT AND PLAN:  1  Acute on chronic diastolic CHF.  Pt is starting to reaccumulate fluid   Wt is up   She has LE edeam  I think it is because she is taking 20 tid instead of 60 alt with 40 daily    Would take 40 today then 60 mg tomorrow and the next day BMET and BNP today and then in 1 wk     Will decide on f/u date based on response   Continue to weigh daily  Again discussed fluid and salt restriction.  2  Permanent atrial fibrillation   keep on Xarelto. And current dlose of b blocker    3  HTN  BP is controlled.  Continue to follow   4  HL   Continue statin      Current medicines are reviewed at length with the patient today.  The patient does not have concerns regarding medicines.  Signed, [name] Carnes, MD  01/29/2022 12:05 PM    [facility] [address], [place], [place]  [address] Phone: [phone]; Fax: [phone]

## 2022-01-29 NOTE — Patient Instructions (Addendum)
Medication Instructions:  LASIX 40MG MORE TODAY THEN  60 MG THE NEXT 2 DAYS THEN  ALTERNATE 40 MG WITH 60 MG EVERY OTHER DAY   HOLD MIRABEGRON FOR NOW   Your physician recommends that you continue on your current medications as directed. Please refer to the Current Medication list given to you today.  *If you need a refill on your cardiac medications before your next appointment, please call your pharmacy*   Lab Work: BMET AND BNP TODAY AND IN ONE WEEK 02/06/22 If you have labs (blood work) drawn today and your tests are completely normal, you will receive your results only by: [place] (if you have MyChart) OR A paper copy in the mail If you have any lab test that is abnormal or we need to change your treatment, we will call you to review the results.   Testing/Procedures: NONE   Follow-Up: At [facility], you and your health needs are our priority.  As part of our continuing mission to provide you with exceptional heart care, we have created designated Provider Care Teams.  These Care Teams include your primary Cardiologist (physician) and Advanced Practice Providers (APPs -  Physician Assistants and Nurse Practitioners) who all work together to provide you with the care you need, when you need it.  We recommend signing up for the patient portal called "MyChart".  Sign up information is provided on this After Visit Summary.  MyChart is used to connect with patients for Virtual Visits (Telemedicine).  Patients are able to view lab/test results, encounter notes, upcoming appointments, etc.  Non-urgent messages can be sent to your provider as well.   To learn more about what you can do with MyChart, go to [email or web address].    Other Instructions

## 2022-01-30 LAB — BASIC METABOLIC PANEL
BUN/Creatinine Ratio: 27 (ref 12–28)
BUN: 43 mg/dL — ABNORMAL HIGH (ref 8–27)
CO2: 24 mmol/L (ref 20–29)
Calcium: 9.4 mg/dL (ref 8.7–10.3)
Chloride: 99 mmol/L (ref 96–106)
Creatinine, Ser: 1.62 mg/dL — ABNORMAL HIGH (ref 0.57–1.00)
Glucose: 88 mg/dL (ref 70–99)
Potassium: 4.5 mmol/L (ref 3.5–5.2)
Sodium: 140 mmol/L (ref 134–144)
eGFR: 31 mL/min/{1.73_m2} — ABNORMAL LOW (ref >59)

## 2022-01-30 LAB — PRO B NATRIURETIC PEPTIDE: NT-Pro BNP: 4667 pg/mL — ABNORMAL HIGH (ref 0–738)

## 2022-02-03 ENCOUNTER — Telehealth: Payer: Self-pay | Admitting: Internal Medicine

## 2022-02-03 DIAGNOSIS — I5032 Chronic diastolic (congestive) heart failure: Secondary | ICD-10-CM

## 2022-02-03 NOTE — Telephone Encounter (Signed)
Spoke with pt's son, [name], [name] who reports pt's weight is up 4 pounds since 01/30/2022.  Pt continues to have SOB on exertion.  [name] son reports minimal edema in feet and legs.  She is alternating Lasix 26m and 485mdaily as per Dr RoHarrington Challengernstructions at her [facility] on 01/29/2022.  Pt denies current CP.  Pt is scheduled for lab work on 02/06/2022. Advised pt's son will forward to Dr RoHarrington Challengeror review and further recommendation.  [name] son verbalizes understanding and agrees with current plan.

## 2022-02-03 NOTE — Telephone Encounter (Signed)
Pt c/o swelling: STAT is pt has developed SOB within 24 hours  If swelling, where is the swelling located? Didn't pay attention to where swelling is located   How much weight have you gained and in what time span? 4 lbs in 4 days   Have you gained 3 pounds in a day or 5 pounds in a week? No   Do you have a log of your daily weights (if so, list)? About a pound a day since 02/02  Are you currently taking a fluid pill? Yes  Are you currently SOB? Patient says she has been having SOB, not having now   Have you traveled recently? No

## 2022-02-04 ENCOUNTER — Other Ambulatory Visit: Payer: Self-pay

## 2022-02-04 ENCOUNTER — Other Ambulatory Visit: Payer: Medicare Other | Admitting: *Deleted

## 2022-02-04 DIAGNOSIS — I5032 Chronic diastolic (congestive) heart failure: Secondary | ICD-10-CM

## 2022-02-04 LAB — BASIC METABOLIC PANEL
BUN/Creatinine Ratio: 22 (ref 12–28)
BUN: 42 mg/dL — ABNORMAL HIGH (ref 8–27)
CO2: 25 mmol/L (ref 20–29)
Calcium: 9.8 mg/dL (ref 8.7–10.3)
Chloride: 99 mmol/L (ref 96–106)
Creatinine, Ser: 1.94 mg/dL — ABNORMAL HIGH (ref 0.57–1.00)
Glucose: 90 mg/dL (ref 70–99)
Potassium: 4.9 mmol/L (ref 3.5–5.2)
Sodium: 137 mmol/L (ref 134–144)
eGFR: 25 mL/min/{1.73_m2} — ABNORMAL LOW (ref >59)

## 2022-02-04 LAB — BRAIN NATRIURETIC PEPTIDE: BNP: 791.5 pg/mL — ABNORMAL HIGH (ref 0.0–100.0)

## 2022-02-04 NOTE — Telephone Encounter (Signed)
[name] son states the patient's weight is up again. He says she went from 125.4 lbs to 128 lbs today. He says she still has the swelling and some SOB, but she is not having the SOB now. He states the SOB is on exertion. He states it is getting to the point where she will end up in hospital again if something is not done.

## 2022-02-04 NOTE — Telephone Encounter (Unsigned)
Pt had right/left heart cath 01/08/22 which showed severely elevated filling pressure and she was admitted for diuresis. Had ov with Dr. Harrington Challenger 01/29/22, was instructed to take lasix 40 mg alternating with 60 mg every other day.  This is how she has been taking it. Weights since visit:  2/2 -- 121.0 2/3 -- 123.2 2/4 -- 123.4 2/5 -- 123.8 2/6 -- 125.4 2/7 -- 128.0  She is sob w exertion but breathing easy at rest.  He is concerned of her increased weights again.  I moved up her repeat lab appointment to stat labs today.  He will bring her this afternoon.

## 2022-02-05 NOTE — Telephone Encounter (Signed)
The patient had labs earlier today which I signed off on. BNP is better    CR is up a little from previous   See phone note that weight is up Does not go along with labs     Is she elevating legs?     I do not want to push meds further without seeing patient      Please set up appt     Keep weighing daily    Watch sallt

## 2022-02-06 ENCOUNTER — Other Ambulatory Visit: Payer: Medicare Other

## 2022-02-06 NOTE — Telephone Encounter (Signed)
Spoke with son and made him aware of labs results and recommendations per Dr. Harrington Challenger.  He is agreeable to appt.  Scheduled pt to see Dr. Harrington Challenger on 2/16.    Son states pt is elevating legs but he is unsure if swelling improves with elevation.  Weights over the last few days are as follows:  2/6- 125.4lbs 2/7- 128lbs 2/8- 127.6lbs 2/9- 127.6lbs

## 2022-02-07 ENCOUNTER — Other Ambulatory Visit: Payer: Self-pay | Admitting: Internal Medicine

## 2022-02-10 ENCOUNTER — Telehealth: Payer: Self-pay | Admitting: Internal Medicine

## 2022-02-10 ENCOUNTER — Telehealth: Payer: Self-pay | Admitting: *Deleted

## 2022-02-10 MED ORDER — ROSUVASTATIN CALCIUM 5 MG PO TABS: 5.0000 mg | ORAL_TABLET | Freq: Every day | ORAL | 3 refills | Status: AC

## 2022-02-10 MED ORDER — ATENOLOL 50 MG PO TABS: 50.0000 mg | ORAL_TABLET | Freq: Two times a day (BID) | ORAL | 3 refills | Status: DC

## 2022-02-10 MED ORDER — POTASSIUM CHLORIDE CRYS ER 20 MEQ PO TBCR: 20.0000 meq | EXTENDED_RELEASE_TABLET | Freq: Every day | ORAL | 3 refills | Status: DC

## 2022-02-10 MED ORDER — ALLOPURINOL 100 MG PO TABS: 100.0000 mg | ORAL_TABLET | Freq: Every day | ORAL | 1 refills | Status: AC

## 2022-02-10 MED ORDER — FUROSEMIDE 40 MG PO TABS: ORAL_TABLET | ORAL | 3 refills | Status: DC

## 2022-02-10 NOTE — Telephone Encounter (Signed)
Patient son called and stated that hospital gave patient Allopurinol and Colchicine in the hospital. They want to know if you want patient to continue this medication and if so, patient needs a refill.   Pended Rx's and sent to [facility] for approval.

## 2022-02-10 NOTE — Telephone Encounter (Signed)
-  still having any flare up of Gout ?  - discontinue colchicine if no flare up  - continue on Allopurinol for prevention of gout.

## 2022-02-10 NOTE — Telephone Encounter (Signed)
*  STAT* If patient is at the pharmacy, call can be transferred to refill team.   1. Which medications need to be refilled? (please list name of each medication and dose if known)  allopurinol (ZYLOPRIM) 100 MG tablet atenolol (TENORMIN) 50 MG tablet potassium chloride SA (KLOR-CON M) 20 MEQ tablet colchicine 0.6 MG tablet rosuvastatin (CRESTOR) 5 MG tablet  2. Which pharmacy/location (including street and city if local pharmacy) is medication to be sent to? [id number] [place], [place] - [address]. Do they need a 30 day or 90 day supply? 90 day

## 2022-02-10 NOTE — Telephone Encounter (Signed)
[name] son is calling in regards to two of the medications he requested not being filled. Due to this he is wanting to know what practice is checking the patient's Uric Acid levels. He states her Uric acid levels are high because of Dr. Harrington Challenger so he is unsure why she is not able to fill the medications she is on because of that. Please advise.

## 2022-02-10 NOTE — Telephone Encounter (Signed)
Patient Caregiver notified and agreed. No Flare up at this time, will continue the Allopurinol.

## 2022-02-10 NOTE — Telephone Encounter (Signed)
Spoke to son, dpr on file. Discussed that we would not currently fill/refill those medications, BUT to further discuss with Dr. Harrington Challenger at appt this week. Son is agreeable.

## 2022-02-10 NOTE — Telephone Encounter (Signed)
[name] medications were sent to pt's pharmacy as requested. Confirmation received.  

## 2022-02-11 ENCOUNTER — Telehealth: Payer: Self-pay | Admitting: Internal Medicine

## 2022-02-11 NOTE — Telephone Encounter (Signed)
Pt c/o swelling: STAT is pt has developed SOB within 24 hours  If swelling, where is the swelling located? Both legs and feet  How much weight have you gained and in what time span? 2.6 wt gain overnight  Have you gained 3 pounds in a day or 5 pounds in a week? Yes   Do you have a log of your daily weights (if so, list)? Yes   Are you currently taking a fluid pill? Yes   Are you currently SOB? No   Have you traveled recently? No

## 2022-02-11 NOTE — Telephone Encounter (Signed)
Called and spoke to son [name] (on [name]) who provided below weights: 2/10 127.2 2/11 127.2 2/12 126.4 2/13 126.4 2/14 129.0 Pt takes alternating dose of Furosemide 61m with 663mevery other day. Pt took 6060moday. Adivsed pt to take additional 109m23mblet for total daily dose of 80mg56m to call back tomorrow if weight has not decreased. Pt denies any increased work of breathing or cough. Will forward to Dr Ross Harrington ChallengerYI. Juluis Rainierer protocol, 3lb or greater gain=increase diuretic x1 and forward to primary cardiologist

## 2022-02-12 ENCOUNTER — Telehealth: Payer: Self-pay | Admitting: Internal Medicine

## 2022-02-12 NOTE — Telephone Encounter (Signed)
I would increase back to 60 mg daily Needs to limit Na in diet

## 2022-02-12 NOTE — Telephone Encounter (Signed)
Pt c/o swelling: STAT is pt has developed SOB within 24 hours  How much weight have you gained and in what time span? .6 lbs over night  If swelling, where is the swelling located? Leg ad feet swollen   Are you currently taking a fluid pill? yes  Are you currently SOB? yes  Do you have a log of your daily weights (if so, list)?    Have you gained 3 pounds in a day or 5 pounds in a week?    Have you traveled recently? No

## 2022-02-12 NOTE — Telephone Encounter (Signed)
Spoke with the pts son and advised and will keep her appt 02/13/22.

## 2022-02-13 ENCOUNTER — Encounter: Payer: Self-pay | Admitting: Internal Medicine

## 2022-02-13 ENCOUNTER — Ambulatory Visit (INDEPENDENT_AMBULATORY_CARE_PROVIDER_SITE_OTHER): Payer: Medicare Other | Admitting: Internal Medicine

## 2022-02-13 ENCOUNTER — Other Ambulatory Visit: Payer: Self-pay

## 2022-02-13 VITALS — BP 132/68 | HR 77 | Resp 14 | Wt 136.0 lb

## 2022-02-13 DIAGNOSIS — R0609 Other forms of dyspnea: Secondary | ICD-10-CM | POA: Diagnosis not present

## 2022-02-13 DIAGNOSIS — Z79899 Other long term (current) drug therapy: Secondary | ICD-10-CM | POA: Diagnosis not present

## 2022-02-13 DIAGNOSIS — N184 Chronic kidney disease, stage 4 (severe): Secondary | ICD-10-CM

## 2022-02-13 DIAGNOSIS — I5032 Chronic diastolic (congestive) heart failure: Secondary | ICD-10-CM | POA: Diagnosis not present

## 2022-02-13 LAB — BASIC METABOLIC PANEL
BUN/Creatinine Ratio: 20 (ref 12–28)
BUN: 44 mg/dL — ABNORMAL HIGH (ref 8–27)
CO2: 21 mmol/L (ref 20–29)
Calcium: 9.1 mg/dL (ref 8.7–10.3)
Chloride: 99 mmol/L (ref 96–106)
Creatinine, Ser: 2.18 mg/dL — ABNORMAL HIGH (ref 0.57–1.00)
Glucose: 90 mg/dL (ref 70–99)
Potassium: 4.4 mmol/L (ref 3.5–5.2)
Sodium: 140 mmol/L (ref 134–144)
eGFR: 21 mL/min/{1.73_m2} — ABNORMAL LOW (ref >59)

## 2022-02-13 LAB — PRO B NATRIURETIC PEPTIDE: NT-Pro BNP: 4177 pg/mL — ABNORMAL HIGH (ref 0–738)

## 2022-02-13 NOTE — Progress Notes (Signed)
Cardiology Office Note   Date:  02/13/2022   ID:  [name] Dyer, DOB 1932-05-06, MRN [id number]  PCP:  [name], NP  Cardiologist:   [name], MD   Pt presents today for f/u of diastolic CHF      History of Present Illness: [name] Dyer is a 88 y.o. female with a history of atrial fibrillation, chronic diastolic CHF, mitral regurgitation, HTN and HL   She was admitted in Feb 2021 with CHF   Echo showed LVEF 55 to 60%; mild to mod MR   Since spring 2022 she had been having more problems with SOB, wt gain   Adjusted lasix   Added Jardiance.   Echo showed LVEF and RVEF were normal    Contniued to adjust meds in the fall (torsemide) adjusting depending on wts and labs   The pt was seen by DOD in December  Felt to be dry   Diuretics held    She continued to have SOB    ReDs vest was placed   Read out at 35    She continued to complain of SOB with this recomm R and L heart cath to confirm anatomy and pressures    This was done on Jan 08, 2022   L heart cath showed no significant CAD    R heart cath showed severely elevated filling pressuress   She was therefore admitted for diuresiss  I saw the pt in Jan  after D/C   The pt had not been taking the diuretics as prescribed   Instead of 60 mg daily she was taking 20 tid    Wt was up, edema increased      She was switched to 60 dialy.   Labs redrawn   With bump in Cr adjusted to 60 alternating with 40 mg per day The pt comes in with her son today   Wt is increased to 129    Still with edema     She says breaghing is stable   Says she is watching salt intake ]   Current Meds  Medication Sig   acetaminophen (TYLENOL) 500 MG tablet Take 1,000 mg by mouth every 6 (six) hours as needed for mild pain or headache.    alendronate (FOSAMAX) 70 MG tablet Take 1 tablet (70 mg total) by mouth once a week.   allopurinol (ZYLOPRIM) 100 MG tablet Take 1 tablet (100 mg total) by mouth daily.   atenolol (TENORMIN) 50 MG tablet Take 1 tablet (50  mg total) by mouth 2 (two) times daily.   calcium carbonate (OS-CAL - DOSED IN MG OF ELEMENTAL CALCIUM) 1250 (500 Ca) MG tablet Take 1 tablet (1,250 mg total) by mouth in the morning.   cholecalciferol (VITAMIN D3) 25 MCG (1000 UT) tablet Take 2,000 Units by mouth daily.   colchicine 0.6 MG tablet Take 1 tablet (0.6 mg total) by mouth daily as needed (As needed for gout flare). Follow up with primary care provider for refill   donepezil (ARICEPT) 10 MG tablet Take 1 tablet (10 mg total) by mouth at bedtime.   furosemide (LASIX) 40 MG tablet TAKE 40 MG DAILY ALTERNATING EVERY OTHER DAY WITH 60 MG (1 AND 1/2 TABLET)DAILY   gabapentin (NEURONTIN) 300 MG capsule Take 300 mg by mouth 3 (three) times daily.   ipratropium (ATROVENT) 0.06 % nasal spray CAN USE 2 SPRAYS IN EACH NOSTRIL EVERY 6 HOURS IF NEEDED TO DRY UP RUNNY N0SE   Melatonin 10  MG TABS Take 10-20 mg by mouth at bedtime.   memantine (NAMENDA) 5 MG tablet Take 1 tablet (5 mg total) by mouth 2 (two) times daily.   Menthol, Topical Analgesic, (BIOFREEZE EX) Apply 1 application topically daily as needed (pain).   mirabegron ER (MYRBETRIQ) 25 MG TB24 tablet Take 1 tablet (25 mg total) by mouth daily. ON HOLD 01/29/22   Multiple Vitamins-Minerals (MULTIPLE VITAMINS/WOMENS PO) Take 1 tablet by mouth daily.   potassium chloride SA (KLOR-CON M) 20 MEQ tablet Take 1 tablet (20 mEq total) by mouth daily.   Rivaroxaban (XARELTO) 15 MG TABS tablet TAKE 1 TABLET (15 MG TOTAL) BY MOUTH DAILY WITH SUPPER.   rosuvastatin (CRESTOR) 5 MG tablet Take 1 tablet (5 mg total) by mouth daily.   traZODone (DESYREL) 50 MG tablet Take 50 mg by mouth at bedtime as needed for sleep.   vitamin B-12 (CYANOCOBALAMIN) 1000 MCG tablet Take 1,000 mcg by mouth daily.   vitamin C (ASCORBIC ACID) 250 MG tablet Take 250 mg by mouth daily.     Allergies:   Ciprofloxacin, Sulfa antibiotics, Memantine, Succinylsulphathiazole, and Sulfamethoxazole   Past Medical History:   Diagnosis Date   Alzheimer disease ([place])    Anemia    Arrhythmia    Cataract    Colon polyps    Community acquired pneumonia 01/2012   LLL   Congestive heart failure ([place])    Hyperlipidemia    Hypertension    Memory difficulty 10/01/2016   Osteoporosis    Pleural effusion, right 01/2012   SVT (supraventricular tachycardia) ([place]) 01/2012   UTI (lower urinary tract infection)     Past Surgical History:  Procedure Laterality Date   [place]   COLONOSCOPY     COLONOSCOPY N/A 06/10/2016   Procedure: COLONOSCOPY;  Surgeon: Manus Gunning, MD;  Location: [facility];  Service: Gastroenterology;  Laterality: N/A;   ESOPHAGOGASTRODUODENOSCOPY N/A 06/10/2016   Procedure: ESOPHAGOGASTRODUODENOSCOPY (EGD);  Surgeon: Manus Gunning, MD;  Location: [facility];  Service: Gastroenterology;  Laterality: N/A;   HEMIARTHROPLASTY HIP  2011   Left femoral neck fracture   RIGHT/LEFT HEART CATH AND CORONARY ANGIOGRAPHY N/A 01/08/2022   Procedure: RIGHT/LEFT HEART CATH AND CORONARY ANGIOGRAPHY;  Surgeon: Leonie Man, MD;  Location: [facility];  Service: Cardiovascular;  Laterality: N/A;   UPPER GASTROINTESTINAL ENDOSCOPY       Social History:  The patient  reports that she has never smoked. She has never used smokeless tobacco. She reports that she does not drink alcohol and does not use drugs.   Family History:  The patient's family history includes Cancer in her grandchild; Colon polyps in her son; Heart Problems in her father and mother; Heart disease in her son; Neuropathy in her son; Osteoporosis in her mother; Other (age of onset: 45) in her brother; Parkinson's disease in her son; Stroke in her father.    ROS:  Please see the history of present illness. All other systems are reviewed and  Negative to the above problem except as noted.    PHYSICAL EXAM: VS:  BP 132/68 (BP Location: Left Arm, Patient Position: Sitting,  Cuff Size: Normal)    Pulse 77    Resp 14    Wt 136 lb (61.7 kg)    SpO2 (!) 86%    BMI 23.34 kg/m   GEN: Patient is in no acute distress  HEENT: normal  Neck: JVP is increased   Cardiac: Irreg  irreg  ; no murmurs;   1-2+ edema LE   Respiratory:  clear to auscultation bilaterally,  GI: soft, nontender, nondistended, + BS  MS: no deformity Moving all extremities   Skin: warm and dry Neuro:   Grossly intact Psych: patient is more talkative today    EKG:  EKG was not  ordered today.   Lipid Panel    Component Value Date/Time   CHOL 123 12/20/2021 1013   CHOL 140 05/17/2021 1411   TRIG 79 12/20/2021 1013   HDL 40 (L) 12/20/2021 1013   HDL 45 05/17/2021 1411   CHOLHDL 3.1 12/20/2021 1013   LDLCALC 67 12/20/2021 1013      Wt Readings from Last 3 Encounters:  02/13/22 136 lb (61.7 kg)  01/29/22 128 lb 6.4 oz (58.2 kg)  01/22/22 126 lb 9.6 oz (57.4 kg)      ASSESSMENT AND PLAN:  1  Acute on chronic diastolic CHF   Very difficult to manage.   PT got edematous soon after d/c because she was not taking lasix as prescribed.    Since seen in clinic last, still with edema Will review BMET and BNP today   Adjust meds as Cr allows    2  Permanent atrial fibrillation   keep on Xarelto and atenolol    3  HTN  BP is controlled.  Continue meds   4  HL   Continue statin      Current medicines are reviewed at length with the patient today.  The patient does not have concerns regarding medicines.  Signed, [name], MD  02/13/2022 2:05 PM    [place], [place], [place]  [address] Phone: [phone]; Fax: [phone]

## 2022-02-13 NOTE — Patient Instructions (Signed)
Medication Instructions:  Your physician recommends that you continue on your current medications as directed. Please refer to the Current Medication list given to you today.  *If you need a refill on your cardiac medications before your next appointment, please call your pharmacy*   Lab Work: Bmet and pro bnp stat If you have labs (blood work) drawn today and your tests are completely normal, you will receive your results only by: [place] (if you have MyChart) OR A paper copy in the mail If you have any lab test that is abnormal or we need to change your treatment, we will call you to review the results.   Testing/Procedures: none   Follow-Up: At [facility], you and your health needs are our priority.  As part of our continuing mission to provide you with exceptional heart care, we have created designated Provider Care Teams.  These Care Teams include your primary Cardiologist (physician) and Advanced Practice Providers (APPs -  Physician Assistants and Nurse Practitioners) who all work together to provide you with the care you need, when you need it.  We recommend signing up for the patient portal called "MyChart".  Sign up information is provided on this After Visit Summary.  MyChart is used to connect with patients for Virtual Visits (Telemedicine).  Patients are able to view lab/test results, encounter notes, upcoming appointments, etc.  Non-urgent messages can be sent to your provider as well.   To learn more about what you can do with MyChart, go to [email or web address].     If primary card or EP is not listed click here to update    :1}    Other Instructions

## 2022-02-14 ENCOUNTER — Telehealth: Payer: Self-pay | Admitting: Internal Medicine

## 2022-02-14 ENCOUNTER — Telehealth: Payer: Self-pay | Admitting: Cardiology

## 2022-02-14 DIAGNOSIS — I5032 Chronic diastolic (congestive) heart failure: Secondary | ICD-10-CM

## 2022-02-14 DIAGNOSIS — I5033 Acute on chronic diastolic (congestive) heart failure: Secondary | ICD-10-CM

## 2022-02-14 DIAGNOSIS — Z79899 Other long term (current) drug therapy: Secondary | ICD-10-CM

## 2022-02-14 MED ORDER — METOLAZONE 2.5 MG PO TABS: ORAL_TABLET | ORAL | 0 refills | Status: DC

## 2022-02-14 NOTE — Telephone Encounter (Signed)
[name] Dyer, Son of the patient called. Patient lost 3 lbs overnight but is still having swelling and SOB. Patient wanted to know if lab results would provide any further information    Pt c/o swelling: STAT is pt has developed SOB within 24 hours  If swelling, where is the swelling located? Legs, feet and ankles  How much weight have you gained and in what time span?   Have you gained 3 pounds in a day or 5 pounds in a week?   Do you have a log of your daily weights (if so, list)?    Are you currently taking a fluid pill? yes  Are you currently SOB? Yes   Have you traveled recently?  Pt c/o Shortness Of Breath: STAT if SOB developed within the last 24 hours or pt is noticeably SOB on the phone  1. Are you currently SOB (can you hear that pt is SOB on the phone)? no  2. How long have you been experiencing SOB? Several weeks  3. Are you SOB when sitting or when up moving around? With exertion, even just walking short distances    4. Are you currently experiencing any other symptoms? Swelling

## 2022-02-14 NOTE — Telephone Encounter (Signed)
[name] son called asking for clarification about newly prescribed metolazone. When the son went to pick up the medication, the pharmacy had flagged the medication due to her documented sulfa allergy. Explained to the patient that literature shows that metolazone can be used in patients with hypersensitivity to sulfa allergies, but they should be aware of any allergic reactions. Patient gets a rash while taking sulfa drugs, so keep an eye out for a rash or hives. Also discussed signs of anaphylaxis and when to present to the ER for severe allergic reaction. Explained that this severe of a reaction is very unlikely to occur.   Son also had questions about scheduling of when to take the metolazone. Per chart review, patient is taking alternating doses of 71m lasix and 60 mg lasix every other day. Dr. RHarrington Challengerinstructed patient to take 2.5 mg metolazone every 4th day, when the patient is taking 60 mg lasix.   Caller verbalized understanding and was grateful for the call back.  KMargie Billet PA-C 02/14/2022, 7:43 PM

## 2022-02-14 NOTE — Telephone Encounter (Signed)
Labs addressed in another note

## 2022-02-14 NOTE — Telephone Encounter (Signed)
Requested clarification from Dr Harrington Challenger on note below who states pt will take Zaroxylyn 2.23m twice weekly-  on days she is taking 635mdose of Furosemide.  Will need to take 30 minutes prior to Furosemide dose. Will need BMET, BNP on Thursday.    Spoke with pt's son, DPR and advised of instructions as above.  Orders placed for medication and labs.  [name] son verbalizes understanding and agrees with current plan.  Note from Dr RoHarrington Challenger Fluid remains elevated  Cr 2.18    I would recomm 2.5 mg Zaroxylyn 2x per week   Take 30 min before a 60 mg tab (Every 4th day) Check BMET and BNP next Friday

## 2022-02-14 NOTE — Telephone Encounter (Signed)
Son states that according to their scales pt has lost 3 lb overnight. Yesterday was 129.8 pds, this morning  126.8 pds. Says that she is still swollen. Asking if labs have been  reviewed and if pt needs to take extra Lasix today. Aware still waiting on MD review , but will forward to her and will call them once reviewed. Aware it may be next week before advised on. Advised to call office if pt worsens over weekend.  Son is agreeable to plan.

## 2022-02-15 ENCOUNTER — Other Ambulatory Visit: Payer: Self-pay | Admitting: Internal Medicine

## 2022-02-18 ENCOUNTER — Telehealth: Payer: Self-pay | Admitting: *Deleted

## 2022-02-18 NOTE — Telephone Encounter (Signed)
Colletta [place], OT with [facility] called requesting verbal orders for OT 1x3weeks.   Verbal order given.

## 2022-02-19 ENCOUNTER — Telehealth: Payer: Self-pay | Admitting: Internal Medicine

## 2022-02-19 NOTE — Telephone Encounter (Signed)
Pt c/o medication issue:  1. Name of Medication: metolazone (ZAROXOLYN) 2.5 MG tablet  2. How are you currently taking this medication (dosage and times per day)? Patient took first dose 02/17/22.  3. Are you having a reaction (difficulty breathing--STAT)?   4. What is your medication issue? Son states after one dose there is no significant change in her swelling. He wanted to know how long until this medication would start working

## 2022-02-19 NOTE — Telephone Encounter (Signed)
Spoke with pt's son, Julitza Rickles, [name] who states pt has had one dose of Zaroxolyn 2.63m on 02/17/2022 and there has been no improvement in swelling of feet, ankles and legs or SOB on exertion but no worsening of symptoms either.  Weight today stable at 129.6 pounds.  Pt is due for labs tomorrow 02/21/2022.  [name] son states he will give another Zaroxolyn on 02/22/2022 as she has already taken Furosemide today and she is to take Zaroxolyn 30 minutes prior to taking Furosemide. Advised to continue current medications and  plan to keep lab appointment tomorrow.  Will forward to Dr RHarrington Challengerfor further recommendations.  Reviewed ED precautions.  [name] son verbalizes understanding and agrees with current plan.

## 2022-02-20 ENCOUNTER — Other Ambulatory Visit: Payer: Medicare Other

## 2022-02-20 ENCOUNTER — Other Ambulatory Visit: Payer: Self-pay

## 2022-02-20 DIAGNOSIS — R7989 Other specified abnormal findings of blood chemistry: Secondary | ICD-10-CM

## 2022-02-20 DIAGNOSIS — I5032 Chronic diastolic (congestive) heart failure: Secondary | ICD-10-CM

## 2022-02-20 DIAGNOSIS — I5033 Acute on chronic diastolic (congestive) heart failure: Secondary | ICD-10-CM

## 2022-02-20 DIAGNOSIS — Z79899 Other long term (current) drug therapy: Secondary | ICD-10-CM

## 2022-02-21 LAB — BASIC METABOLIC PANEL
BUN/Creatinine Ratio: 21 (ref 12–28)
BUN: 43 mg/dL — ABNORMAL HIGH (ref 8–27)
CO2: 27 mmol/L (ref 20–29)
Calcium: 9.6 mg/dL (ref 8.7–10.3)
Chloride: 96 mmol/L (ref 96–106)
Creatinine, Ser: 2.01 mg/dL — ABNORMAL HIGH (ref 0.57–1.00)
Glucose: 96 mg/dL (ref 70–99)
Potassium: 3.5 mmol/L (ref 3.5–5.2)
Sodium: 140 mmol/L (ref 134–144)
eGFR: 24 mL/min/{1.73_m2} — ABNORMAL LOW (ref >59)

## 2022-02-21 LAB — TSH: TSH: 3.04 mIU/L (ref 0.40–4.50)

## 2022-02-21 LAB — PRO B NATRIURETIC PEPTIDE: NT-Pro BNP: 4228 pg/mL — ABNORMAL HIGH (ref 0–738)

## 2022-02-22 ENCOUNTER — Other Ambulatory Visit: Payer: Self-pay | Admitting: Family

## 2022-02-25 ENCOUNTER — Telehealth: Payer: Self-pay

## 2022-02-25 DIAGNOSIS — R0609 Other forms of dyspnea: Secondary | ICD-10-CM

## 2022-02-25 DIAGNOSIS — I5032 Chronic diastolic (congestive) heart failure: Secondary | ICD-10-CM

## 2022-02-25 DIAGNOSIS — Z79899 Other long term (current) drug therapy: Secondary | ICD-10-CM

## 2022-02-25 DIAGNOSIS — I5033 Acute on chronic diastolic (congestive) heart failure: Secondary | ICD-10-CM

## 2022-02-25 DIAGNOSIS — N184 Chronic kidney disease, stage 4 (severe): Secondary | ICD-10-CM

## 2022-02-25 NOTE — Telephone Encounter (Signed)
Left a message for the pts son to call back to go over the lab results and to be sure he was able to get Dr. Harrington Challenger' message re: the results and to see how the pt is doing.

## 2022-02-25 NOTE — Telephone Encounter (Signed)
[name] son returning call. 

## 2022-02-25 NOTE — Telephone Encounter (Signed)
-----  Message from [name], MD sent at 02/24/2022  8:44 AM EST ----- Left msg on Friday on VM Fluid remains elevated  Cannot push diuretics more    Cr 2.   Increasaed from 1 month ago At limit with meds     Needs to keep vigilant with limiting sodium, elevate legs Only way to improve would be IV diuresis (as she came into hospital and had)

## 2022-02-25 NOTE — Telephone Encounter (Signed)
Spoke with the pts Son [name] Dyer (on [name]) and endorsed to him the pts lab results and recommendations per Dr. Harrington Challenger.   Son is aware that they should be vigilant with the pts sodium use, elevate her extremities at rest, and wear compressions during the day.  Advised the pts son to continue monitoring her dry weights and report any critical parameters.   Son did want to ask Dr. Harrington Challenger if she should have maintenance labs done in the near future, to follow-up on fluid status and renal function.   Informed the pts Son that I will route this message to both Dr. Harrington Challenger and Lelon Frohlich RN to further review and advise on future lab order request.  Informed the pts son that Lelon Frohlich RN or a triage nurse will follow-up with him accordingly thereafter.  Son verbalized understanding and agrees with this plan.

## 2022-02-27 ENCOUNTER — Telehealth: Payer: Self-pay | Admitting: Internal Medicine

## 2022-02-27 NOTE — Telephone Encounter (Signed)
Spoke with the patient's son and advised that the patient is taking medications correctly. He verbalized understanding. He reports that the patient's weight has been fluctuating. It will come down a couple of pounds and then go back up the next day. Overall it has been stable though.  ?

## 2022-02-27 NOTE — Telephone Encounter (Signed)
Pt c/o medication issue: ? ?1. Name of Medication:  ?metolazone (ZAROXOLYN) 2.5 MG tablet ? ?2. How are you currently taking this medication (dosage and times per day)? 1 tablet twice weekly 30 minutes prior to furosemide 60 mg ? ?3. Are you having a reaction (difficulty breathing--STAT)? no ? ?4. What is your medication issue? [name] son says the instructions for the metolazone are to take 1 tablet twice weekly 30 minutes prior to furosemide. He says right now she is taking 60 mg furosemide and then the next day 40 mg. He says due to this she is taking the metolazone every 4 days. He says she took it 2/20, 2/24, 2/28 and will take the next dose tomorrow. He would like to know if this is the correct way for her to take the medication.  ?

## 2022-02-28 NOTE — Telephone Encounter (Signed)
Get BMET with BNP March 13 or 14 ?

## 2022-03-03 ENCOUNTER — Ambulatory Visit: Payer: Medicare Other | Admitting: Neurology

## 2022-03-03 NOTE — Telephone Encounter (Signed)
Spoke with the pts son and she will have labs 03/11/22.  ?

## 2022-03-03 NOTE — Addendum Note (Signed)
Addended by: Stephani Police on: 03/03/2022 03:52 PM ? ? Modules accepted: Orders ? ?

## 2022-03-03 NOTE — Telephone Encounter (Addendum)
Left a message for the pt to call back.  ? ?Lab orders are in Epic need lab appt.  ?

## 2022-03-04 ENCOUNTER — Ambulatory Visit: Payer: Medicare Other | Admitting: Neurology

## 2022-03-04 NOTE — Progress Notes (Unsigned)
p   PATIENT: [name] Dyer DOB: 12-31-1931  REASON FOR VISIT: follow up for memory HISTORY FROM: patient Primary Neurologist: Dr. Jannifer Franklin   Today 03/04/22 [place]   Update 07/02/2021 SS: [name] Dyer is an 88 year old female with history of memory disturbance. MMSE 23/30 today. Living alone, her sons come throughout the day. She is alone in mornings and night. Does fairly well. She forgets mainly at night, where she put something. She keeps up with medications, does well with this, son looks behind her, uses pillbox. Family brings meals in. Doesn't drive a car. Family helps with housework, she does laundry. Uses rollator. Currently taking Namenda 5 mg twice daily, Aricept 10 mg at bedtime. Sleeps fairly well, denies any dreams. Dizziness manageable with current dose of Namenda, also on Jardiance, which has same effect. Takes trazodone as needed. Xarelto for history AFIB. Claims no major health issues. Has a list of medications she may want to try: Tofranil, Zyprexa. Does walk outside when weather allows. Here today with son, [name].   HISTORY  01/30/2021 Dr. Jannifer Franklin: [name] is an 88 year old right-handed white female with a history of a slowly progressive memory disturbance that has been present for a number of years.  The patient was first seen through this office and 2017, she was having difficulty at that time with directions with driving.  She scored 28/30 on the Mini-Mental status examination.  The patient underwent MRI of the brain that showed fairly extensive small vessel disease and a chronic right occipital stroke event.  The patient is on chronic anticoagulation therapy.  She has been on Aricept taking 10 mg daily.  She does report some vivid dreams on the medication.  She recent was placed on Namenda, and could not tolerate the drug secondary to dizziness but she claims that she was started on 28 mg extended release medication daily.  She only took the medication for 4 days.  The  patient currently lives at home, she is alone at this point as her husband has passed away.  She does not operate a motor vehicle.  She does not cook, her daughter-in-law makes meals for her and she will hit them up.  She takes pills from a pill dispenser and she writes the checks to pay the bills but requires some help with this.  She does require some help keeping up with appointments.  The patient has not had any falls but she does use a walker outside of the house.  The patient denies issues controlling the bowels or the bladder.  She does have some discomfort in the legs associated with neuropathy, she takes gabapentin for this.  She claims that she does fairly well with the neuropathy pain.  REVIEW OF SYSTEMS: Out of a complete 14 system review of symptoms, the patient complains only of the following symptoms, and all other reviewed systems are negative.  See HPI  ALLERGIES: Allergies  Allergen Reactions   Ciprofloxacin Diarrhea   Sulfa Antibiotics Hives and Rash    Severe Rash   Memantine Other (See Comments)    dizziness   Succinylsulphathiazole Rash   Sulfamethoxazole Rash    HOME MEDICATIONS: Outpatient Medications Prior to Visit  Medication Sig Dispense Refill   acetaminophen (TYLENOL) 500 MG tablet Take 1,000 mg by mouth every 6 (six) hours as needed for mild pain or headache.      alendronate (FOSAMAX) 70 MG tablet Take 1 tablet (70 mg total) by mouth once a week. 12 tablet 1   allopurinol (  ZYLOPRIM) 100 MG tablet Take 1 tablet (100 mg total) by mouth daily. 90 tablet 1   atenolol (TENORMIN) 50 MG tablet Take 1 tablet (50 mg total) by mouth 2 (two) times daily. 180 tablet 3   calcium carbonate (OS-CAL - DOSED IN MG OF ELEMENTAL CALCIUM) 1250 (500 Ca) MG tablet Take 1 tablet (1,250 mg total) by mouth in the morning. 30 tablet 3   cholecalciferol (VITAMIN D3) 25 MCG (1000 UT) tablet Take 2,000 Units by mouth daily.     colchicine 0.6 MG tablet Take 1 tablet (0.6 mg total) by  mouth daily as needed (As needed for gout flare). Follow up with primary care provider for refill 30 tablet 0   donepezil (ARICEPT) 10 MG tablet Take 1 tablet (10 mg total) by mouth at bedtime. 90 tablet 3   furosemide (LASIX) 40 MG tablet TAKE 40 MG DAILY ALTERNATING EVERY OTHER DAY WITH 60 MG (1 AND 1/2 TABLET)DAILY 90 tablet 3   gabapentin (NEURONTIN) 300 MG capsule TAKE 1 CAPSULE BY MOUTH THREE TIMES A DAY 270 capsule 1   ipratropium (ATROVENT) 0.06 % nasal spray CAN USE 2 SPRAYS IN EACH NOSTRIL EVERY 6 HOURS IF NEEDED TO DRY UP RUNNY N0SE 15 mL 0   Melatonin 10 MG TABS Take 10-20 mg by mouth at bedtime.     memantine (NAMENDA) 5 MG tablet Take 1 tablet (5 mg total) by mouth 2 (two) times daily. 180 tablet 3   Menthol, Topical Analgesic, (BIOFREEZE EX) Apply 1 application topically daily as needed (pain).     metolazone (ZAROXOLYN) 2.5 MG tablet Take one tablet by mouth twice weekly - 30 minutes prior to taking your 10m dose of Furosemide 8 tablet 0   mirabegron ER (MYRBETRIQ) 25 MG TB24 tablet Take 1 tablet (25 mg total) by mouth daily. ON HOLD 01/29/22 30 tablet    Multiple Vitamins-Minerals (MULTIPLE VITAMINS/WOMENS PO) Take 1 tablet by mouth daily.     potassium chloride SA (KLOR-CON M) 20 MEQ tablet Take 1 tablet (20 mEq total) by mouth daily. 90 tablet 3   Rivaroxaban (XARELTO) 15 MG TABS tablet TAKE 1 TABLET (15 MG TOTAL) BY MOUTH DAILY WITH SUPPER. 90 tablet 1   rosuvastatin (CRESTOR) 5 MG tablet Take 1 tablet (5 mg total) by mouth daily. 90 tablet 3   traZODone (DESYREL) 50 MG tablet Take 50 mg by mouth at bedtime as needed for sleep.     vitamin B-12 (CYANOCOBALAMIN) 1000 MCG tablet Take 1,000 mcg by mouth daily.     vitamin C (ASCORBIC ACID) 250 MG tablet Take 250 mg by mouth daily.     No facility-administered medications prior to visit.    PAST MEDICAL HISTORY: Past Medical History:  Diagnosis Date   Alzheimer disease ([place]    Anemia    Arrhythmia    Cataract    Colon  polyps    Community acquired pneumonia 01/2012   LLL   Congestive heart failure ([facility]    Hyperlipidemia    Hypertension    Memory difficulty 10/01/2016   Osteoporosis    Pleural effusion, right 01/2012   SVT (supraventricular tachycardia) ([facility] 01/2012   UTI (lower urinary tract infection)     PAST SURGICAL HISTORY: Past Surgical History:  Procedure Laterality Date   ABDOMINAL HYSTERECTOMY  1983   APPENDECTOMY  1983   COLONOSCOPY     COLONOSCOPY N/A 06/10/2016   Procedure: COLONOSCOPY;  Surgeon: SManus Gunning MD;  Location: [facility];  Service: Gastroenterology;  Laterality: N/A;   ESOPHAGOGASTRODUODENOSCOPY N/A 06/10/2016   Procedure: ESOPHAGOGASTRODUODENOSCOPY (EGD);  Surgeon: Manus Gunning, MD;  Location: [facility];  Service: Gastroenterology;  Laterality: N/A;   HEMIARTHROPLASTY HIP  2011   Left femoral neck fracture   RIGHT/LEFT HEART CATH AND CORONARY ANGIOGRAPHY N/A 01/08/2022   Procedure: RIGHT/LEFT HEART CATH AND CORONARY ANGIOGRAPHY;  Surgeon: Leonie Man, MD;  Location: [facility];  Service: Cardiovascular;  Laterality: N/A;   UPPER GASTROINTESTINAL ENDOSCOPY      FAMILY HISTORY: Family History  Problem Relation Age of Onset   Heart Problems Mother    Osteoporosis Mother    Stroke Father    Heart Problems Father    Other Brother 76       COVID   Colon polyps Son    Parkinson's disease Son    Neuropathy Son    Heart disease Son    Cancer Grandchild     SOCIAL HISTORY: Social History   Socioeconomic History   Marital status: Widowed    Spouse name: Not on file   Number of children: 2   Years of education: 93   Highest education level: Not on file  Occupational History   Occupation: Retired  Tobacco Use   Smoking status: Never   Smokeless tobacco: Never  Vaping Use   Vaping Use: Never used  Substance and Sexual Activity   Alcohol use: Never   Drug use: Never   Sexual activity: Not on file  Other Topics  Concern   Not on file  Social History Narrative   Diet: low sodium/ fluid      Caffeine:Green tea, cheerwine,chocolate      Married, if yes what year: [phone]      Do you live in a house, apartment, assisted living, condo, trailer, ect: House      Is it one or more stories: One      How many persons live in your home? 1      Pets: No      Highest level or education completed: High School      Current/Past profession: [employer]      Exercise: Yes                 Type and how often: Walking, strength training          Living Will: Yes    DNR: No   POA/HPOA: Yes      Functional Status:   Do you have difficulty bathing or dressing yourself? No   Do you have difficulty preparing food or eating? No   Do you have difficulty managing your medications? Yes   Do you have difficulty managing your finances? Yes   Do you have difficulty affording your medications? Yes   Social Determinants of Health   Financial Resource Strain: Not on file  Food Insecurity: Not on file  Transportation Needs: Not on file  Physical Activity: Not on file  Stress: Not on file  Social Connections: Not on file  Intimate Partner Violence: Not on file   PHYSICAL EXAM  There were no vitals filed for this visit.  There is no height or weight on file to calculate BMI.  Generalized: Well developed, in no acute distress  MMSE - Mini Mental State Exam 07/02/2021 01/30/2021 06/02/2018  Orientation to time _0 Orientation to Place _1 Registration _2 Attention/ Calculation 0 1 5  Recall 2 1 3  Language- name 2 objects _0 Language- repeat _1 Language- follow 3 step command _2 Language- read & follow direction _3 Write a sentence _4 Write a sentence-comments - I am asleep -  Copy design _5 Total score _6 Neurological examination  Mentation: Alert, history provided by patient and son. Follows all commands speech and language fluent, poor  historian Cranial nerve II-XII: Pupils were equal round reactive to light. Extraocular movements were full, visual field were full on confrontational test. Facial sensation and strength were normal. Head turning and shoulder shrug  were normal and symmetric. Motor: The motor testing reveals 5 over 5 strength of all 4 extremities. Good symmetric motor tone is noted throughout.  Sensory: Sensory testing is intact to soft touch on all 4 extremities. No evidence of extinction is noted.  Coordination: Cerebellar testing reveals good finger-nose-finger and heel-to-shin bilaterally.  Gait and station: Gait is wide-based, uses rolling walker, short shuffling type gait Reflexes: Deep tendon reflexes are symmetric but depressed throughout DIAGNOSTIC DATA (LABS, IMAGING, TESTING) - I reviewed patient records, labs, notes, testing and imaging myself where available.  Lab Results  Component Value Date   WBC 7.1 01/17/2022   HGB 12.9 01/17/2022   HCT 40.5 01/17/2022   MCV 91.4 01/17/2022   PLT PLATELET CLUMPS NOTED ON SMEAR, UNABLE TO ESTIMATE 01/17/2022      Component Value Date/Time   NA 140 02/20/2022 1419   K 3.5 02/20/2022 1419   CL 96 02/20/2022 1419   CO2 27 02/20/2022 1419   GLUCOSE 96 02/20/2022 1419   GLUCOSE 100 (H) 01/17/2022 0239   BUN 43 (H) 02/20/2022 1419   CREATININE 2.01 (H) 02/20/2022 1419   CREATININE 0.72 03/16/2015 1717   CALCIUM 9.6 02/20/2022 1419   PROT 5.6 (L) 01/09/2022 0253   PROT 6.7 05/17/2021 1411   ALBUMIN 3.1 (L) 01/09/2022 0253   ALBUMIN 4.5 05/17/2021 1411   AST 26 01/09/2022 0253   ALT 21 01/09/2022 0253   ALKPHOS 99 01/09/2022 0253   BILITOT 1.3 (H) 01/09/2022 0253   BILITOT 0.7 05/17/2021 1411   GFRNONAA 37 (L) 01/17/2022 0239   GFRAA 50 (L) 11/08/2020 1149   Lab Results  Component Value Date   CHOL 123 12/20/2021   HDL 40 (L) 12/20/2021   LDLCALC 67 12/20/2021   TRIG 79 12/20/2021   CHOLHDL 3.1 12/20/2021   No results found for: HGBA1C Lab  Results  Component Value Date   VITAMINB12 >2,000 (H) 12/20/2021   Lab Results  Component Value Date   TSH 3.04 02/20/2022      ASSESSMENT AND PLAN 88 y.o. year old female  has a past medical history of Alzheimer disease ([place]), Anemia, Arrhythmia, Cataract, Colon polyps, Community acquired pneumonia (01/2012), Congestive heart failure ([place]), Hyperlipidemia, Hypertension, Memory difficulty (10/01/2016), Osteoporosis, Pleural effusion, right (01/2012), SVT (supraventricular tachycardia) ([place]) (01/2012), and UTI (lower urinary tract infection). here with:    1.  Chronic memory disturbance   2.  Cerebrovascular disease by MRI brain  -MMSE 23/30, remains overall stable -Continue Aricept 10 mg at bedtime -Continue Namenda 5 mg twice a day, higher dosing resulted in dizziness, she does still have mild dizziness, but wishes to continue the medication -She remains active doing word puzzles, lives alone with family support -Follow-up in 8 to 12 months or sooner if needed    Butler Denmark, AGNP-C, DNP 03/04/2022, 5:42  AM [facility] [address], [place], [place] [address] [phone]

## 2022-03-10 ENCOUNTER — Other Ambulatory Visit: Payer: Medicare Other

## 2022-03-11 ENCOUNTER — Telehealth: Payer: Self-pay | Admitting: Internal Medicine

## 2022-03-11 ENCOUNTER — Other Ambulatory Visit: Payer: Self-pay

## 2022-03-11 ENCOUNTER — Other Ambulatory Visit: Payer: Medicare Other

## 2022-03-11 DIAGNOSIS — R0609 Other forms of dyspnea: Secondary | ICD-10-CM

## 2022-03-11 DIAGNOSIS — I5033 Acute on chronic diastolic (congestive) heart failure: Secondary | ICD-10-CM

## 2022-03-11 DIAGNOSIS — Z79899 Other long term (current) drug therapy: Secondary | ICD-10-CM

## 2022-03-11 DIAGNOSIS — I5032 Chronic diastolic (congestive) heart failure: Secondary | ICD-10-CM

## 2022-03-11 DIAGNOSIS — N184 Chronic kidney disease, stage 4 (severe): Secondary | ICD-10-CM

## 2022-03-11 NOTE — Telephone Encounter (Signed)
?  Pt c/o medication issue: ? ?1. Name of Medication: metolazone (ZAROXOLYN) 2.5 MG tablet ? ?2. How are you currently taking this medication (dosage and times per day)? Take one tablet by mouth twice weekly - 30 minutes prior to taking your 4m dose of Furosemide ? ?3. Are you having a reaction (difficulty breathing--STAT)?  ? ?4. What is your medication issue? [name] son DSimona Huhcalled wanting to know if it would be okay to give his mother metolazone (ZAROXOLYN) 2.5 MG tablet.  She was suppose to take it on Sunday and she forgot.  He wants to know if it's okay for her to day it today.    ?

## 2022-03-11 NOTE — Telephone Encounter (Signed)
Per Dr. Harrington Challenger, [name] Dyer advised to go ahead and give her the metolazone today and then 4 days later for her next dose... pt is having her BMET today. He says she has been feeling well.  ?

## 2022-03-11 NOTE — Telephone Encounter (Signed)
Spoke with pt's son and pt's weight is stable 123 this am  Son is wanting to know if okay to take Metolazone today since missed dose on Sunday Sent to to Dr Harrington Challenger for review  Pt coming in today for lab work . ?

## 2022-03-12 LAB — BASIC METABOLIC PANEL
BUN/Creatinine Ratio: 32 — ABNORMAL HIGH (ref 12–28)
BUN: 64 mg/dL — ABNORMAL HIGH (ref 8–27)
CO2: 26 mmol/L (ref 20–29)
Calcium: 10 mg/dL (ref 8.7–10.3)
Chloride: 89 mmol/L — ABNORMAL LOW (ref 96–106)
Creatinine, Ser: 1.97 mg/dL — ABNORMAL HIGH (ref 0.57–1.00)
Glucose: 89 mg/dL (ref 70–99)
Potassium: 3.7 mmol/L (ref 3.5–5.2)
Sodium: 133 mmol/L — ABNORMAL LOW (ref 134–144)
eGFR: 24 mL/min/{1.73_m2} — ABNORMAL LOW (ref >59)

## 2022-03-12 LAB — PRO B NATRIURETIC PEPTIDE: NT-Pro BNP: 3640 pg/mL — ABNORMAL HIGH (ref 0–738)

## 2022-03-13 ENCOUNTER — Telehealth: Payer: Self-pay | Admitting: *Deleted

## 2022-03-13 NOTE — Telephone Encounter (Signed)
Stephanie with [facility] called requesting verbal orders to Recert OT.  ?Verbal orders given.  ?

## 2022-03-14 ENCOUNTER — Telehealth: Payer: Self-pay | Admitting: Internal Medicine

## 2022-03-14 DIAGNOSIS — I5033 Acute on chronic diastolic (congestive) heart failure: Secondary | ICD-10-CM

## 2022-03-14 DIAGNOSIS — R0609 Other forms of dyspnea: Secondary | ICD-10-CM

## 2022-03-14 DIAGNOSIS — Z79899 Other long term (current) drug therapy: Secondary | ICD-10-CM

## 2022-03-14 DIAGNOSIS — N184 Chronic kidney disease, stage 4 (severe): Secondary | ICD-10-CM

## 2022-03-14 DIAGNOSIS — I5032 Chronic diastolic (congestive) heart failure: Secondary | ICD-10-CM

## 2022-03-14 NOTE — Telephone Encounter (Signed)
Pts son verbalized understanding of her recent labs and will repeat labs at her Internal med visit 04/18/22.  ? ? ?

## 2022-03-14 NOTE — Telephone Encounter (Signed)
[name] son returning call for lab results. ?

## 2022-03-18 ENCOUNTER — Other Ambulatory Visit: Payer: Self-pay | Admitting: Internal Medicine

## 2022-03-19 ENCOUNTER — Telehealth: Payer: Self-pay | Admitting: Internal Medicine

## 2022-03-19 ENCOUNTER — Telehealth: Payer: Self-pay

## 2022-03-19 ENCOUNTER — Other Ambulatory Visit: Payer: Self-pay

## 2022-03-19 NOTE — Telephone Encounter (Signed)
? ?  Pt c/o medication issue: ? ?1. Name of Medication: metolazone (ZAROXOLYN) 2.5 MG tablet ? ?2. How are you currently taking this medication (dosage and times per day)? TAKE ONE TABLET BY MOUTH TWICE WEEKLY - 30 MINUTES PRIOR TO TAKING YOUR 60MG DOSE OF FUROSEMIDE ? ?3. Are you having a reaction (difficulty breathing--STAT)?  ? ?4. What is your medication issue? [name] spouse calling, he said when hr tried to pick up this meds the pharmacy told them they need authorization from Dr. Harrington Challenger  ?

## 2022-03-19 NOTE — Telephone Encounter (Signed)
Colletta Maryland from [facility] called requesting verbal orders for occupational therapy for 2 times a week for 4 weeks.  ? ?Verbal orders given.  ?

## 2022-03-19 NOTE — Telephone Encounter (Signed)
Noted  

## 2022-03-19 NOTE — Telephone Encounter (Signed)
Pts son advised that I sent in the RX.... he says the pt is doing well... her peripheral edema is improving and her weight is at 122 lbs... he will keep an eye on her BP and weight.  ?

## 2022-03-25 ENCOUNTER — Other Ambulatory Visit: Payer: Self-pay

## 2022-03-25 ENCOUNTER — Ambulatory Visit
Admission: RE | Admit: 2022-03-25 | Discharge: 2022-03-25 | Disposition: A | Discharge status: Home / Self Care | Payer: Medicare Other | Source: Ambulatory Visit | Attending: Family | Admitting: Family

## 2022-03-25 ENCOUNTER — Ambulatory Visit (INDEPENDENT_AMBULATORY_CARE_PROVIDER_SITE_OTHER): Payer: Medicare Other | Admitting: Family

## 2022-03-25 ENCOUNTER — Encounter: Payer: Self-pay | Admitting: Family

## 2022-03-25 VITALS — BP 130/80 | HR 70 | Temp 96.7°F | Resp 16 | Ht 64.0 in | Wt 122.0 lb

## 2022-03-25 DIAGNOSIS — M79671 Pain in right foot: Secondary | ICD-10-CM | POA: Diagnosis not present

## 2022-03-25 DIAGNOSIS — M7989 Other specified soft tissue disorders: Secondary | ICD-10-CM | POA: Diagnosis not present

## 2022-03-25 NOTE — Progress Notes (Signed)
? ?Provider: Marlowe Sax FNP-C ? ?Zamari Bonsall, [name], NP ? ?Patient Care Team: ?Norvin Ohlin, [name], NP as PCP - General (Family Medicine) ?[name], MD as PCP - Cardiology (Cardiology) ?Kozlow, Donnamarie Poag, MD as Consulting Physician (Allergy and Immunology) ?[name], DPM as Consulting Physician (Podiatry) ?[name], MD as Consulting Physician (Obstetrics and Gynecology) ?[name], MD (Inactive) (Neurology) ?[name], MD as Consulting Physician (Urology) ? ?Extended Emergency Contact Information ?Primary Emergency Contact: Munroe,[name] ? [place] ?Home Phone: [phone] ?Mobile Phone: [phone] ?Relation: Son ?Secondary Emergency Contact: Stigger Jr,[name] ? [place] ?Home Phone: [phone] ?Mobile Phone: [phone] ?Relation: Son ? ?Code Status: Full Code  ?Goals of care: Advanced Directive information ? ?  01/22/2022  ?  2:53 PM  ?Advanced Directives  ?Does Patient Have a Medical Advance Directive? Yes  ?Type of Paramedic of [place];Living will  ?Does patient want to make changes to medical advance directive? No - Patient declined  ?Copy of [place] in Chart? Yes - validated most recent copy scanned in chart (See row information)  ? ? ? ?Chief Complaint  ?Patient presents with  ? Acute Visit  ?  Patient complains of right foot/ankle red, painful, and swelling. Patient started to noticed this last week.   ? ? ?HPI:  ?Pt is a 88 y.o. female seen today for an acute visit for evaluation of right foot pain,redness and swelling x 1 week.foot painful with movement. she denies any fever,chills,or any injuries to foot.Has a significant history of peripheral Neuropathy.Also takes Allopurinol for gout.  ? ? ?Past Medical History:  ?Diagnosis Date  ? Alzheimer disease ([place])   ? Anemia   ? Arrhythmia   ? Cataract   ? Colon polyps   ? Community acquired pneumonia 01/2012  ? LLL  ? Congestive heart failure  ([place])   ? Hyperlipidemia   ? Hypertension   ? Memory difficulty 10/01/2016  ? Osteoporosis   ? Pleural effusion, right 01/2012  ? SVT (supraventricular tachycardia) ([place]) 01/2012  ? UTI (lower urinary tract infection)   ? ?Past Surgical History:  ?Procedure Laterality Date  ? ABDOMINAL HYSTERECTOMY  1983  ? APPENDECTOMY  1983  ? COLONOSCOPY    ? COLONOSCOPY N/A 06/10/2016  ? Procedure: COLONOSCOPY;  Surgeon: Manus Gunning, MD;  Location: [facility];  Service: Gastroenterology;  Laterality: N/A;  ? ESOPHAGOGASTRODUODENOSCOPY N/A 06/10/2016  ? Procedure: ESOPHAGOGASTRODUODENOSCOPY (EGD);  Surgeon: Manus Gunning, MD;  Location: [facility];  Service: Gastroenterology;  Laterality: N/A;  ? HEMIARTHROPLASTY HIP  2011  ? Left femoral neck fracture  ? RIGHT/LEFT HEART CATH AND CORONARY ANGIOGRAPHY N/A 01/08/2022  ? Procedure: RIGHT/LEFT HEART CATH AND CORONARY ANGIOGRAPHY;  Surgeon: Leonie Man, MD;  Location: [facility];  Service: Cardiovascular;  Laterality: N/A;  ? UPPER GASTROINTESTINAL ENDOSCOPY    ? ? ?Allergies  ?Allergen Reactions  ? Ciprofloxacin Diarrhea  ? Sulfa Antibiotics Hives and Rash  ?  Severe Rash  ? Memantine Other (See Comments)  ?  dizziness  ? Succinylsulphathiazole Rash  ? Sulfamethoxazole Rash  ? ? ?Outpatient Encounter Medications as of 03/25/2022  ?Medication Sig  ? acetaminophen (TYLENOL) 500 MG tablet Take 1,000 mg by mouth every 6 (six) hours as needed for mild pain or headache.   ? alendronate (FOSAMAX) 70 MG tablet Take 1 tablet (70 mg total) by mouth once a week.  ? allopurinol (ZYLOPRIM) 100 MG tablet Take 1 tablet (100 mg  total) by mouth daily.  ? atenolol (TENORMIN) 50 MG tablet Take 1 tablet (50 mg total) by mouth 2 (two) times daily.  ? calcium carbonate (OS-CAL - DOSED IN MG OF ELEMENTAL CALCIUM) 1250 (500 Ca) MG tablet Take 1 tablet (1,250 mg total) by mouth in the morning.  ? cholecalciferol (VITAMIN D3) 25 MCG (1000 UT) tablet Take 2,000 Units by mouth  daily.  ? colchicine 0.6 MG tablet Take 1 tablet (0.6 mg total) by mouth daily as needed (As needed for gout flare). Follow up with primary care provider for refill  ? donepezil (ARICEPT) 10 MG tablet Take 1 tablet (10 mg total) by mouth at bedtime.  ? furosemide (LASIX) 40 MG tablet TAKE 40 MG DAILY ALTERNATING EVERY OTHER DAY WITH 60 MG (1 AND 1/2 TABLET)DAILY  ? gabapentin (NEURONTIN) 300 MG capsule TAKE 1 CAPSULE BY MOUTH THREE TIMES A DAY  ? ipratropium (ATROVENT) 0.06 % nasal spray CAN USE 2 SPRAYS IN EACH NOSTRIL EVERY 6 HOURS IF NEEDED TO DRY UP RUNNY N0SE  ? Melatonin 10 MG TABS Take 10-20 mg by mouth at bedtime.  ? memantine (NAMENDA) 5 MG tablet Take 1 tablet (5 mg total) by mouth 2 (two) times daily.  ? Menthol, Topical Analgesic, (BIOFREEZE EX) Apply 1 application topically daily as needed (pain).  ? metolazone (ZAROXOLYN) 2.5 MG tablet TAKE ONE TABLET BY MOUTH TWICE WEEKLY - 30 MINUTES PRIOR TO TAKING YOUR 60MG DOSE OF FUROSEMIDE  ? mirabegron ER (MYRBETRIQ) 25 MG TB24 tablet Take 1 tablet (25 mg total) by mouth daily. ON HOLD 01/29/22  ? Multiple Vitamins-Minerals (MULTIPLE VITAMINS/WOMENS PO) Take 1 tablet by mouth daily.  ? potassium chloride SA (KLOR-CON M) 20 MEQ tablet Take 1 tablet (20 mEq total) by mouth daily.  ? Rivaroxaban (XARELTO) 15 MG TABS tablet TAKE 1 TABLET (15 MG TOTAL) BY MOUTH DAILY WITH SUPPER.  ? rosuvastatin (CRESTOR) 5 MG tablet Take 1 tablet (5 mg total) by mouth daily.  ? traZODone (DESYREL) 50 MG tablet Take 50 mg by mouth at bedtime as needed for sleep.  ? vitamin B-12 (CYANOCOBALAMIN) 1000 MCG tablet Take 1,000 mcg by mouth daily.  ? vitamin C (ASCORBIC ACID) 250 MG tablet Take 250 mg by mouth daily.  ? ?No facility-administered encounter medications on file as of 03/25/2022.  ? ? ?Review of Systems  ?Constitutional:  Negative for appetite change, chills, fatigue, fever and unexpected weight change.  ?Eyes:  Negative for pain, discharge, redness and itching.  ?Respiratory:   Negative for cough, chest tightness, shortness of breath and wheezing.   ?Cardiovascular:  Negative for chest pain, palpitations and leg swelling.  ?Musculoskeletal:  Positive for gait problem. Negative for arthralgias, back pain, joint swelling, myalgias, neck pain and neck stiffness.  ?     Right foot pain / swelling   ?Skin:  Negative for color change, pallor, rash and wound.  ? ?Immunization History  ?Administered Date(s) Administered  ? Influenza, High Dose Seasonal PF 11/19/2018  ? Influenza,inj,Quad PF,6+ Mos 11/17/2018  ? Influenza,inj,quad, With Preservative 10/29/2017  ? Influenza-Unspecified 10/29/2021  ? ?Pertinent  Health Maintenance Due  ?Topic Date Due  ? DEXA SCAN  Never done  ? INFLUENZA VACCINE  Completed  ? ? ?  01/15/2022  ?  3:49 PM 01/15/2022  ? 10:20 PM 01/17/2022  ?  8:00 AM 01/22/2022  ?  2:53 PM 03/25/2022  ?  1:06 PM  ?Fall Risk  ?Falls in the past year?    0 0  ?  Was there an injury with Fall?    0 0  ?Fall Risk Category Calculator    0 0  ?Fall Risk Category    Low Low  ?Patient Fall Risk Level High fall risk High fall risk High fall risk Low fall risk Low fall risk  ?Patient at Risk for Falls Due to    No Fall Risks No Fall Risks  ?Fall risk Follow up    Falls evaluation completed Falls evaluation completed  ? ?Functional Status Survey: ?  ? ?Vitals:  ? 03/25/22 1318  ?BP: 130/80  ?Pulse: 70  ?Resp: 16  ?Temp: (!) 96.7 ?F (35.9 ?C)  ?SpO2: 97%  ?Weight: 122 lb (55.3 kg)  ?Height: _0  (1.626 m)  ? ?Body mass index is 20.94 kg/m?Marland Kitchen ?Physical Exam ?Vitals reviewed.  ?Constitutional:   ?   General: She is not in acute distress. ?   Appearance: Normal appearance. She is normal weight. She is not ill-appearing or diaphoretic.  ?Eyes:  ?   General: No scleral icterus.    ?   Right eye: No discharge.     ?   Left eye: No discharge.  ?   Conjunctiva/sclera: Conjunctivae normal.  ?   Pupils: Pupils are equal, round, and reactive to light.  ?Cardiovascular:  ?   Rate and Rhythm: Normal rate and regular  rhythm.  ?   Pulses: Normal pulses.  ?   Heart sounds: Normal heart sounds. No murmur heard. ?  No friction rub. No gallop.  ?Pulmonary:  ?   Effort: Pulmonary effort is normal. No respiratory distress.

## 2022-03-25 NOTE — Patient Instructions (Addendum)
-  Apply ice compressor to right top of the foot for 10 -20 minutes to relief pain and swelling. ? ?- Take Colchine 0.6 mg tablet Take two tablets by mouth today then one by mouth for three days then resume Allopurinol  ? ?- Please get right foot X-ray at [place] [facility] then will call you with results. ? ? ? ?

## 2022-03-26 ENCOUNTER — Other Ambulatory Visit: Payer: Self-pay | Admitting: Family

## 2022-03-26 DIAGNOSIS — F5104 Psychophysiologic insomnia: Secondary | ICD-10-CM

## 2022-03-26 LAB — CBC WITH DIFFERENTIAL/PLATELET
Absolute Monocytes: 673 cells/uL (ref 200–950)
Basophils Absolute: 68 cells/uL (ref 0–200)
Basophils Relative: 1 %
Eosinophils Absolute: 510 cells/uL — ABNORMAL HIGH (ref 15–500)
Eosinophils Relative: 7.5 %
HCT: 38.4 % (ref 35.0–45.0)
Hemoglobin: 12.2 g/dL (ref 11.7–15.5)
Lymphs Abs: 537 cells/uL — ABNORMAL LOW (ref 850–3900)
MCH: 28.5 pg (ref 27.0–33.0)
MCHC: 31.8 g/dL — ABNORMAL LOW (ref 32.0–36.0)
MCV: 89.7 fL (ref 80.0–100.0)
Monocytes Relative: 9.9 %
Neutro Abs: 5012 cells/uL (ref 1500–7800)
Neutrophils Relative %: 73.7 %
Platelets: 176 10*3/uL (ref 140–400)
RBC: 4.28 10*6/uL (ref 3.80–5.10)
RDW: 16.4 % — ABNORMAL HIGH (ref 11.0–15.0)
Total Lymphocyte: 7.9 %
WBC: 6.8 10*3/uL (ref 3.8–10.8)

## 2022-03-26 LAB — SEDIMENTATION RATE: Sed Rate: 14 mm/h (ref 0–30)

## 2022-03-26 LAB — URIC ACID: Uric Acid, Serum: 9.3 mg/dL — ABNORMAL HIGH (ref 2.5–7.0)

## 2022-03-27 NOTE — Telephone Encounter (Signed)
Pharmacy requested refill.  ?Pended Rx and sent to [facility] for approval.  ?

## 2022-04-01 ENCOUNTER — Telehealth: Payer: Self-pay | Admitting: *Deleted

## 2022-04-01 NOTE — Telephone Encounter (Signed)
Patient son, [name] called stating that patient had a swallowing test in the Hospital in January and they cannot remember the results.  ?Patient is still having issues and wants you to review the test results and let them know the results. Can't remember the results.  ? ?Please Advise.  ?

## 2022-04-01 NOTE — Telephone Encounter (Signed)
Swallow study showed no aspiration.Regular solid food with thin liquids recommended.also small sips /bites and follow with liquids. ?If still having issues with clearing throat or problems swallowing I recommend referral to Gastroenterologist for further evaluation.  ?

## 2022-04-01 NOTE — Telephone Encounter (Signed)
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  ? ?Stated that he will have the paperwork faxed to [place] for [name] to review.  ? ?Awaiting fax.  ?

## 2022-04-02 NOTE — Telephone Encounter (Signed)
[name], son Notified and agreed and stated that he will discuss further at appointment on 04/18/2022. ?

## 2022-04-07 NOTE — Telephone Encounter (Signed)
Paperwork received. Patient son, [name] Dyer is requesting "Time needed could be up to 10-15 hours per week. Possibly 3-4 occurrences per week. This is to help where and when brother cannot assist. Each occurrence could be up to 3-4 hours. Would need to also assist with transportation to different doctor appointments." ? ?Needs to be completed and returned to [facility] [phone] ? ?Case ID:[id number] ? ?Placed in [name]'s folder to review, fill out and sign.  ?

## 2022-04-10 ENCOUNTER — Other Ambulatory Visit: Payer: Self-pay | Admitting: Internal Medicine

## 2022-04-11 NOTE — Telephone Encounter (Signed)
FMLA Paperwork faxed to [place] ?Fax:[phone] ? ?Copy sent to [place].  ?

## 2022-04-11 NOTE — Telephone Encounter (Signed)
FMLA paperwork completed please fax as requested by patient's son [name].  ?

## 2022-04-14 ENCOUNTER — Telehealth: Payer: Self-pay | Admitting: Neurology

## 2022-04-14 NOTE — Telephone Encounter (Signed)
[name] husband called wanting to speak to RN about certain reactions the pt is having. He thinks that the the traZODone (DESYREL) 50 MG tablet that the PCP has put her on is making her Alzheimer's worse. [name] husband was asked if he had reached out to the PCP to discuss and he stated that they have yet to call him back. Please advise.  ?

## 2022-04-14 NOTE — Telephone Encounter (Signed)
I spoke to the patient's son. Says her PCP has started her on trazodone 30m, one tab QHS. Prescribed to help her sleep through the night. He feels she is remaining tired the following day and more confused. He has called her PCP and left a message to see if the dose can be decreased or something else can be considered. For now, he is going to half the tablet until he hears back from them. He is hopeful this will help. She has a pending follow up with our office in May.  ?

## 2022-04-15 DIAGNOSIS — F0280 Dementia in other diseases classified elsewhere without behavioral disturbance: Secondary | ICD-10-CM

## 2022-04-15 DIAGNOSIS — I5033 Acute on chronic diastolic (congestive) heart failure: Secondary | ICD-10-CM | POA: Diagnosis not present

## 2022-04-15 DIAGNOSIS — I4821 Permanent atrial fibrillation: Secondary | ICD-10-CM

## 2022-04-15 DIAGNOSIS — I2510 Atherosclerotic heart disease of native coronary artery without angina pectoris: Secondary | ICD-10-CM

## 2022-04-15 DIAGNOSIS — G309 Alzheimer's disease, unspecified: Secondary | ICD-10-CM | POA: Diagnosis not present

## 2022-04-15 DIAGNOSIS — I110 Hypertensive heart disease with heart failure: Secondary | ICD-10-CM | POA: Diagnosis not present

## 2022-04-15 DIAGNOSIS — I081 Rheumatic disorders of both mitral and tricuspid valves: Secondary | ICD-10-CM

## 2022-04-15 DIAGNOSIS — I272 Pulmonary hypertension, unspecified: Secondary | ICD-10-CM | POA: Diagnosis not present

## 2022-04-16 ENCOUNTER — Ambulatory Visit (INDEPENDENT_AMBULATORY_CARE_PROVIDER_SITE_OTHER): Payer: Medicare Other | Admitting: Family

## 2022-04-16 ENCOUNTER — Other Ambulatory Visit: Payer: Self-pay

## 2022-04-16 ENCOUNTER — Encounter: Payer: Self-pay | Admitting: Family

## 2022-04-16 VITALS — BP 116/72 | HR 104 | Temp 97.5°F | Resp 20 | Ht 64.0 in | Wt 121.9 lb

## 2022-04-16 DIAGNOSIS — Z23 Encounter for immunization: Secondary | ICD-10-CM

## 2022-04-16 DIAGNOSIS — I2510 Atherosclerotic heart disease of native coronary artery without angina pectoris: Secondary | ICD-10-CM

## 2022-04-16 DIAGNOSIS — I4821 Permanent atrial fibrillation: Secondary | ICD-10-CM

## 2022-04-16 DIAGNOSIS — K5901 Slow transit constipation: Secondary | ICD-10-CM

## 2022-04-16 DIAGNOSIS — I5033 Acute on chronic diastolic (congestive) heart failure: Secondary | ICD-10-CM

## 2022-04-16 DIAGNOSIS — N184 Chronic kidney disease, stage 4 (severe): Secondary | ICD-10-CM

## 2022-04-16 DIAGNOSIS — I130 Hypertensive heart and chronic kidney disease with heart failure and stage 1 through stage 4 chronic kidney disease, or unspecified chronic kidney disease: Secondary | ICD-10-CM | POA: Diagnosis not present

## 2022-04-16 DIAGNOSIS — E782 Mixed hyperlipidemia: Secondary | ICD-10-CM

## 2022-04-16 DIAGNOSIS — Z79899 Other long term (current) drug therapy: Secondary | ICD-10-CM

## 2022-04-16 DIAGNOSIS — I5032 Chronic diastolic (congestive) heart failure: Secondary | ICD-10-CM

## 2022-04-16 DIAGNOSIS — R0609 Other forms of dyspnea: Secondary | ICD-10-CM

## 2022-04-16 DIAGNOSIS — I10 Essential (primary) hypertension: Secondary | ICD-10-CM

## 2022-04-16 MED ORDER — DOCUSATE SODIUM 100 MG PO CAPS: 100.0000 mg | ORAL_CAPSULE | Freq: Two times a day (BID) | ORAL | 0 refills | Status: DC

## 2022-04-16 MED ORDER — TETANUS-DIPHTH-ACELL PERTUSSIS 5-2.5-18.5 LF-MCG/0.5 IM SUSP: 0.5000 mL | Freq: Once | INTRAMUSCULAR | 0 refills | Status: AC

## 2022-04-16 NOTE — Patient Instructions (Signed)
Please get shingles vaccine,Tetanus vaccine and COVID-19 vaccine at the pharmacy  ?

## 2022-04-16 NOTE — Progress Notes (Signed)
? ?Provider: Marlowe Sax FNP-C  ? ?Alieyah Spader, [name], NP ? ?Patient Care Team: ?Ranada Vigorito, [name], NP as PCP - General (Family Medicine) ?[name], MD as PCP - Cardiology (Cardiology) ?Kozlow, Donnamarie Poag, MD as Consulting Physician (Allergy and Immunology) ?[name], DPM as Consulting Physician (Podiatry) ?[name], MD as Consulting Physician (Obstetrics and Gynecology) ?[name], MD (Inactive) (Neurology) ?[name], MD as Consulting Physician (Urology) ? ?Extended Emergency Contact Information ?Primary Emergency Contact: Whitham,[name] ? [place] ?Home Phone: [phone] ?Mobile Phone: [phone] ?Relation: Son ?Secondary Emergency Contact: Cocker Jr,[name] ? [place] ?Home Phone: [phone] ?Mobile Phone: [phone] ?Relation: Son ? ?Code Status:  Full Code  ?Goals of care: Advanced Directive information ? ?  04/16/2022  ?  1:31 PM  ?Advanced Directives  ?Does Patient Have a Medical Advance Directive? Yes  ?Does patient want to make changes to medical advance directive? No - Patient declined  ? ? ? ?Chief Complaint  ?Patient presents with  ? Medical Management of Chronic Issues  ?  4 month follow up.  ? Concern  ?  Trazodone Concerns.  ? Labs   ?  Requesting Labs ?  ? Health Maintenance  ?  Discuss the need Dexa Scan.  ? Immunizations  ?  Discuss the need for Shingrix vaccine, Covid vaccine, and Tetanus vaccine.  ? ? ?HPI:  ?Pt is a 88 y.o. female seen today for 58-monthfollow-up for medical management of chronic diseases.  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 ?She denies any acute issues today. Has lab work ordered by cardiologist that would like to be drawn with her regular fasting lab work here today. ?Stated to wait and  leg edema has been stable.  Still has occasional dyspnea with exertion.  She was last seen by cardiologist Dr. RDorris Carneson 02/13/2022 for chronic congestive heart failure and A-fib medication continued.  Reports no abrupt weight gain.She denies any fever,chills,cough,fatigue,body aches,runny nose,chest tightness,chest pain,palpitation, orthopnea or shortness of breath. ? ?Health maintenance:  ?Due for bone density ?COVID-19 vaccine ?Shingrix vaccine ?Pneumococcal vaccine ? ?Past Medical History:  ?Diagnosis Date  ? [place]   ? Anemia   ? Arrhythmia   ? Cataract   ? Colon polyps   ? Community acquired pneumonia 01/2012  ? LLL  ? [place] heart [place] ([facility] [place]   ? Hyperlipidemia   ? Hypertension   ? Memory difficulty 10/01/2016  ? Osteoporosis   ? Pleural effusion, right 01/2012  ? SVT (supraventricular tachycardia) ([facility] 01/2012  ? UTI (lower urinary tract infection)   ? ?Past Surgical History:  ?Procedure Laterality Date  ? ABDOMINAL HYSTERECTOMY  1983  ? APPENDECTOMY  1983  ? COLONOSCOPY    ? COLONOSCOPY N/A 06/10/2016  ? Procedure: COLONOSCOPY;  Surgeon: SManus Gunning MD;  [facility];  Service: Gastroenterology;  Laterality: N/A;  ? ESOPHAGOGASTRODUODENOSCOPY N/A 06/10/2016  ? Procedure: ESOPHAGOGASTRODUODENOSCOPY (EGD);  Surgeon: SManus Gunning MD;  [facility];  Service: Gastroenterology;  Laterality: N/A;  ? HEMIARTHROPLASTY HIP  2011  ? Left femoral neck fracture  ? RIGHT/LEFT HEART CATH AND CORONARY ANGIOGRAPHY N/A 01/08/2022  ? Procedure: RIGHT/LEFT HEART CATH AND CORONARY ANGIOGRAPHY;  Surgeon: HLeonie Man MD;  [facility];  Service: Cardiovascular;  Laterality: N/A;  ?  UPPER GASTROINTESTINAL ENDOSCOPY    ? ? ?Allergies  ?Allergen Reactions  ? Ciprofloxacin Diarrhea  ? Sulfa Antibiotics Hives and Rash  ?  Severe Rash  ? Memantine Other (See Comments)  ?  dizziness  ? Succinylsulphathiazole Rash  ? Sulfamethoxazole Rash   ? ? ?Allergies as of 04/16/2022   ? ?   Reactions  ? Ciprofloxacin Diarrhea  ? Sulfa Antibiotics Hives, Rash  ? Severe Rash  ? Memantine Other (See Comments)  ? dizziness  ? Succinylsulphathiazole Rash  ? Sulfamethoxazole Rash  ? ?  ? ?  ?Medication List  ?  ? ?  ? Accurate as of April 16, 2022  2:46 PM. If you have any questions, ask your nurse or doctor.  ?  ?  ? ?  ? ?acetaminophen 500 MG tablet ?Commonly known as: TYLENOL ?Take 1,000 mg by mouth every 6 (six) hours as needed for mild pain or headache. ?  ?alendronate 70 MG tablet ?Commonly known as: FOSAMAX ?Take 1 tablet (70 mg total) by mouth once a week. ?  ?allopurinol 100 MG tablet ?Commonly known as: ZYLOPRIM ?Take 1 tablet (100 mg total) by mouth daily. ?  ?atenolol 50 MG tablet ?Commonly known as: TENORMIN ?Take 1 tablet (50 mg total) by mouth 2 (two) times daily. ?  ?BIOFREEZE EX ?Apply 1 application topically daily as needed (pain). ?  ?calcium carbonate 1250 (500 Ca) MG tablet ?Commonly known as: OS-CAL - dosed in mg of elemental calcium ?Take 1 tablet (1,250 mg total) by mouth in the morning. ?  ?cholecalciferol 25 MCG (1000 UNIT) tablet ?Commonly known as: VITAMIN D3 ?Take 2,000 Units by mouth daily. ?  ?colchicine 0.6 MG tablet ?Take 1 tablet (0.6 mg total) by mouth daily as needed (As needed for gout flare). Follow up with primary care provider for refill ?  ?donepezil 10 MG tablet ?Commonly known as: ARICEPT ?Take 1 tablet (10 mg total) by mouth at bedtime. ?  ?furosemide 40 MG tablet ?Commonly known as: LASIX ?TAKE 40 MG DAILY ALTERNATING EVERY OTHER DAY WITH 60 MG (1 AND 1/2 TABLET)DAILY ?  ?gabapentin 300 MG capsule ?Commonly known as: NEURONTIN ?TAKE 1 CAPSULE BY MOUTH THREE TIMES A DAY ?  ?ipratropium 0.06 % nasal spray ?Commonly known as: ATROVENT ?CAN USE 2 SPRAYS IN EACH NOSTRIL EVERY 6 HOURS IF NEEDED TO DRY UP RUNNY N0SE ?  ?Melatonin 10 MG Tabs ?Take 10-20 mg by mouth at bedtime. ?  ?memantine 5 MG tablet ?Commonly known as:  NAMENDA ?Take 1 tablet (5 mg total) by mouth 2 (two) times daily. ?  ?metolazone 2.5 MG tablet ?Commonly known as: ZAROXOLYN ?TAKE ONE TABLET BY MOUTH TWICE WEEKLY - 30 MINUTES PRIOR TO TAKING YOUR 60MG DOSE OF FUROSEMIDE ?  ?mirabegron ER 25 MG Tb24 tablet ?Commonly known as: MYRBETRIQ ?Take 1 tablet (25 mg total) by mouth daily. ON HOLD 01/29/22 ?  ?MULTIPLE VITAMINS/WOMENS PO ?Take 1 tablet by mouth daily. ?  ?potassium chloride SA 20 MEQ tablet ?Commonly known as: KLOR-CON M ?Take 1 tablet (20 mEq total) by mouth daily. ?  ?rosuvastatin 5 MG tablet ?Commonly known as: CRESTOR ?Take 1 tablet (5 mg total) by mouth daily. ?  ?traZODone 50 MG tablet ?Commonly known as: DESYREL ?TAKE 1 TABLET EVERY DAY BY ORAL ROUTE AS NEEDED. ?  ?vitamin B-12 1000 MCG tablet ?Commonly known as: CYANOCOBALAMIN ?Take 1,000 mcg by mouth daily. ?  ?vitamin C 250 MG tablet ?Commonly known as: ASCORBIC ACID ?Take 250 mg by mouth  daily. ?  ?Xarelto 15 MG Tabs tablet ?Generic drug: Rivaroxaban ?TAKE 1 TABLET (15 MG TOTAL) BY MOUTH DAILY WITH SUPPER. ?  ? ?  ? ? ?Review of Systems  ?Constitutional:  Negative for appetite change, chills, fatigue, fever and unexpected weight change.  ?HENT:  Negative for congestion, dental problem, ear discharge, ear pain, facial swelling, hearing loss, nosebleeds, postnasal drip, rhinorrhea, sinus pressure, sinus pain, sneezing, sore throat, tinnitus and trouble swallowing.   ?Eyes:  Negative for pain, discharge, redness, itching and visual disturbance.  ?Respiratory:  Negative for cough, chest tightness, shortness of breath and wheezing.   ?Cardiovascular:  Negative for chest pain, palpitations and leg swelling.  ?Gastrointestinal:  Negative for abdominal distention, abdominal pain, blood in stool, constipation, diarrhea, nausea and vomiting.  ?Endocrine: Negative for cold intolerance, heat intolerance, polydipsia, polyphagia and polyuria.  ?Genitourinary:  Negative for difficulty urinating, dysuria, flank  pain, frequency and urgency.  ?Musculoskeletal:  Positive for gait problem. Negative for arthralgias, back pain, joint swelling, myalgias, neck pain and neck stiffness.  ?Skin:  Negative for color change, pallor, rash and w

## 2022-04-17 LAB — COMPLETE METABOLIC PANEL WITH GFR
AG Ratio: 1.4 (calc) (ref 1.0–2.5)
ALT: 32 U/L — ABNORMAL HIGH (ref 6–29)
AST: 42 U/L — ABNORMAL HIGH (ref 10–35)
Albumin: 4.1 g/dL (ref 3.6–5.1)
Alkaline phosphatase (APISO): 151 U/L (ref 37–153)
BUN/Creatinine Ratio: 41 (calc) — ABNORMAL HIGH (ref 6–22)
BUN: 78 mg/dL — ABNORMAL HIGH (ref 7–25)
CO2: 31 mmol/L (ref 20–32)
Calcium: 10 mg/dL (ref 8.6–10.4)
Chloride: 90 mmol/L — ABNORMAL LOW (ref 98–110)
Creat: 1.92 mg/dL — ABNORMAL HIGH (ref 0.60–0.95)
Globulin: 2.9 g/dL (calc) (ref 1.9–3.7)
Glucose, Bld: 101 mg/dL — ABNORMAL HIGH (ref 65–99)
Potassium: 3 mmol/L — ABNORMAL LOW (ref 3.5–5.3)
Sodium: 136 mmol/L (ref 135–146)
Total Bilirubin: 1.1 mg/dL (ref 0.2–1.2)
Total Protein: 7 g/dL (ref 6.1–8.1)
eGFR: 25 mL/min/{1.73_m2} — ABNORMAL LOW (ref >=60)

## 2022-04-17 LAB — CBC WITH DIFFERENTIAL/PLATELET
Absolute Monocytes: 943 cells/uL (ref 200–950)
Basophils Absolute: 82 cells/uL (ref 0–200)
Basophils Relative: 1 %
Eosinophils Absolute: 451 cells/uL (ref 15–500)
Eosinophils Relative: 5.5 %
HCT: 39.2 % (ref 35.0–45.0)
Hemoglobin: 12.3 g/dL (ref 11.7–15.5)
Lymphs Abs: 615 cells/uL — ABNORMAL LOW (ref 850–3900)
MCH: 29.2 pg (ref 27.0–33.0)
MCHC: 31.4 g/dL — ABNORMAL LOW (ref 32.0–36.0)
MCV: 93.1 fL (ref 80.0–100.0)
MPV: 11.3 fL (ref 7.5–12.5)
Monocytes Relative: 11.5 %
Neutro Abs: 6109 cells/uL (ref 1500–7800)
Neutrophils Relative %: 74.5 %
Platelets: 206 10*3/uL (ref 140–400)
RBC: 4.21 10*6/uL (ref 3.80–5.10)
RDW: 17.4 % — ABNORMAL HIGH (ref 11.0–15.0)
Total Lymphocyte: 7.5 %
WBC: 8.2 10*3/uL (ref 3.8–10.8)

## 2022-04-17 LAB — LIPID PANEL
Cholesterol: 127 mg/dL (ref <200)
HDL: 47 mg/dL — ABNORMAL LOW (ref >=50)
LDL Cholesterol (Calc): 64 mg/dL (calc)
Non-HDL Cholesterol (Calc): 80 mg/dL (calc) (ref <130)
Total CHOL/HDL Ratio: 2.7 (calc) (ref <5.0)
Triglycerides: 80 mg/dL (ref <150)

## 2022-04-18 ENCOUNTER — Ambulatory Visit: Payer: Medicare Other | Admitting: Family

## 2022-04-20 DIAGNOSIS — I130 Hypertensive heart and chronic kidney disease with heart failure and stage 1 through stage 4 chronic kidney disease, or unspecified chronic kidney disease: Secondary | ICD-10-CM | POA: Insufficient documentation

## 2022-04-21 ENCOUNTER — Inpatient Hospital Stay (HOSPITAL_COMMUNITY)
Admission: EM | Admit: 2022-04-21 | Discharge: 2022-04-27 | DRG: 064 | Disposition: A | Discharge status: Home Health | Payer: Medicare Other | Attending: Family Medicine | Admitting: Family Medicine

## 2022-04-21 ENCOUNTER — Encounter (HOSPITAL_COMMUNITY): Payer: Self-pay | Admitting: Emergency Medicine

## 2022-04-21 ENCOUNTER — Telehealth: Payer: Self-pay | Admitting: *Deleted

## 2022-04-21 ENCOUNTER — Emergency Department (HOSPITAL_COMMUNITY): Payer: Medicare Other

## 2022-04-21 ENCOUNTER — Other Ambulatory Visit: Payer: Self-pay

## 2022-04-21 DIAGNOSIS — E785 Hyperlipidemia, unspecified: Secondary | ICD-10-CM | POA: Diagnosis present

## 2022-04-21 DIAGNOSIS — I10 Essential (primary) hypertension: Secondary | ICD-10-CM | POA: Diagnosis present

## 2022-04-21 DIAGNOSIS — E876 Hypokalemia: Secondary | ICD-10-CM | POA: Diagnosis not present

## 2022-04-21 DIAGNOSIS — Z820 Family history of epilepsy and other diseases of the nervous system: Secondary | ICD-10-CM

## 2022-04-21 DIAGNOSIS — M810 Age-related osteoporosis without current pathological fracture: Secondary | ICD-10-CM | POA: Diagnosis present

## 2022-04-21 DIAGNOSIS — G309 Alzheimer's disease, unspecified: Secondary | ICD-10-CM | POA: Diagnosis present

## 2022-04-21 DIAGNOSIS — N1832 Chronic kidney disease, stage 3b: Secondary | ICD-10-CM | POA: Diagnosis present

## 2022-04-21 DIAGNOSIS — I63542 Cerebral infarction due to unspecified occlusion or stenosis of left cerebellar artery: Principal | ICD-10-CM | POA: Diagnosis present

## 2022-04-21 DIAGNOSIS — R29702 NIHSS score 2: Secondary | ICD-10-CM | POA: Diagnosis present

## 2022-04-21 DIAGNOSIS — N390 Urinary tract infection, site not specified: Secondary | ICD-10-CM | POA: Diagnosis present

## 2022-04-21 DIAGNOSIS — Z79899 Other long term (current) drug therapy: Secondary | ICD-10-CM

## 2022-04-21 DIAGNOSIS — N3000 Acute cystitis without hematuria: Secondary | ICD-10-CM

## 2022-04-21 DIAGNOSIS — Z7901 Long term (current) use of anticoagulants: Secondary | ICD-10-CM

## 2022-04-21 DIAGNOSIS — R269 Unspecified abnormalities of gait and mobility: Secondary | ICD-10-CM | POA: Diagnosis not present

## 2022-04-21 DIAGNOSIS — G629 Polyneuropathy, unspecified: Secondary | ICD-10-CM

## 2022-04-21 DIAGNOSIS — Z823 Family history of stroke: Secondary | ICD-10-CM

## 2022-04-21 DIAGNOSIS — F0280 Dementia in other diseases classified elsewhere without behavioral disturbance: Secondary | ICD-10-CM | POA: Diagnosis present

## 2022-04-21 DIAGNOSIS — N179 Acute kidney failure, unspecified: Secondary | ICD-10-CM | POA: Diagnosis present

## 2022-04-21 DIAGNOSIS — R7401 Elevation of levels of liver transaminase levels: Secondary | ICD-10-CM | POA: Diagnosis present

## 2022-04-21 DIAGNOSIS — I672 Cerebral atherosclerosis: Secondary | ICD-10-CM | POA: Diagnosis present

## 2022-04-21 DIAGNOSIS — B954 Other streptococcus as the cause of diseases classified elsewhere: Secondary | ICD-10-CM | POA: Diagnosis present

## 2022-04-21 DIAGNOSIS — B9689 Other specified bacterial agents as the cause of diseases classified elsewhere: Secondary | ICD-10-CM

## 2022-04-21 DIAGNOSIS — I4821 Permanent atrial fibrillation: Secondary | ICD-10-CM | POA: Diagnosis present

## 2022-04-21 DIAGNOSIS — I130 Hypertensive heart and chronic kidney disease with heart failure and stage 1 through stage 4 chronic kidney disease, or unspecified chronic kidney disease: Secondary | ICD-10-CM | POA: Diagnosis present

## 2022-04-21 DIAGNOSIS — B961 Klebsiella pneumoniae [K. pneumoniae] as the cause of diseases classified elsewhere: Secondary | ICD-10-CM | POA: Diagnosis present

## 2022-04-21 DIAGNOSIS — Z8371 Family history of colonic polyps: Secondary | ICD-10-CM

## 2022-04-21 DIAGNOSIS — F02818 Dementia in other diseases classified elsewhere, unspecified severity, with other behavioral disturbance: Secondary | ICD-10-CM | POA: Diagnosis present

## 2022-04-21 DIAGNOSIS — Z882 Allergy status to sulfonamides status: Secondary | ICD-10-CM

## 2022-04-21 DIAGNOSIS — I5033 Acute on chronic diastolic (congestive) heart failure: Secondary | ICD-10-CM | POA: Diagnosis present

## 2022-04-21 DIAGNOSIS — G9341 Metabolic encephalopathy: Secondary | ICD-10-CM | POA: Diagnosis present

## 2022-04-21 DIAGNOSIS — I5032 Chronic diastolic (congestive) heart failure: Secondary | ICD-10-CM | POA: Diagnosis present

## 2022-04-21 DIAGNOSIS — I639 Cerebral infarction, unspecified: Secondary | ICD-10-CM

## 2022-04-21 DIAGNOSIS — Z1612 Extended spectrum beta lactamase (ESBL) resistance: Secondary | ICD-10-CM | POA: Diagnosis present

## 2022-04-21 DIAGNOSIS — M109 Gout, unspecified: Secondary | ICD-10-CM | POA: Diagnosis present

## 2022-04-21 DIAGNOSIS — Z8262 Family history of osteoporosis: Secondary | ICD-10-CM

## 2022-04-21 DIAGNOSIS — Z888 Allergy status to other drugs, medicaments and biological substances status: Secondary | ICD-10-CM

## 2022-04-21 DIAGNOSIS — Z881 Allergy status to other antibiotic agents status: Secondary | ICD-10-CM

## 2022-04-21 DIAGNOSIS — N189 Chronic kidney disease, unspecified: Secondary | ICD-10-CM | POA: Diagnosis present

## 2022-04-21 LAB — CBC WITH DIFFERENTIAL/PLATELET
Abs Immature Granulocytes: 0.07 10*3/uL (ref 0.00–0.07)
Basophils Absolute: 0.1 10*3/uL (ref 0.0–0.1)
Basophils Relative: 1 %
Eosinophils Absolute: 0.5 10*3/uL (ref 0.0–0.5)
Eosinophils Relative: 5 %
HCT: 40.5 % (ref 36.0–46.0)
Hemoglobin: 12.7 g/dL (ref 12.0–15.0)
Immature Granulocytes: 1 %
Lymphocytes Relative: 5 %
Lymphs Abs: 0.5 10*3/uL — ABNORMAL LOW (ref 0.7–4.0)
MCH: 28.9 pg (ref 26.0–34.0)
MCHC: 31.4 g/dL (ref 30.0–36.0)
MCV: 92 fL (ref 80.0–100.0)
Monocytes Absolute: 0.7 10*3/uL (ref 0.1–1.0)
Monocytes Relative: 7 %
Neutro Abs: 8 10*3/uL — ABNORMAL HIGH (ref 1.7–7.7)
Neutrophils Relative %: 81 %
Platelets: UNDETERMINED 10*3/uL (ref 150–400)
RBC: 4.4 MIL/uL (ref 3.87–5.11)
RDW: 19 % — ABNORMAL HIGH (ref 11.5–15.5)
WBC: 9.8 10*3/uL (ref 4.0–10.5)
nRBC: 0 % (ref 0.0–0.2)

## 2022-04-21 LAB — COMPREHENSIVE METABOLIC PANEL
ALT: 38 U/L (ref 0–44)
AST: 48 U/L — ABNORMAL HIGH (ref 15–41)
Albumin: 3.9 g/dL (ref 3.5–5.0)
Alkaline Phosphatase: 148 U/L — ABNORMAL HIGH (ref 38–126)
Anion gap: 18 — ABNORMAL HIGH (ref 5–15)
BUN: 82 mg/dL — ABNORMAL HIGH (ref 8–23)
CO2: 26 mmol/L (ref 22–32)
Calcium: 9.1 mg/dL (ref 8.9–10.3)
Chloride: 91 mmol/L — ABNORMAL LOW (ref 98–111)
Creatinine, Ser: 2.67 mg/dL — ABNORMAL HIGH (ref 0.44–1.00)
GFR, Estimated: 17 mL/min — ABNORMAL LOW (ref >60)
Glucose, Bld: 130 mg/dL — ABNORMAL HIGH (ref 70–99)
Potassium: 3.5 mmol/L (ref 3.5–5.1)
Sodium: 135 mmol/L (ref 135–145)
Total Bilirubin: 1.5 mg/dL — ABNORMAL HIGH (ref 0.3–1.2)
Total Protein: 7.7 g/dL (ref 6.5–8.1)

## 2022-04-21 NOTE — ED Triage Notes (Signed)
Patient coming from home, complaint of weakness for several days, states normally able to ambulate around the house, now unable. States recently stopped Trazadone, and started having issues once stopped. ?

## 2022-04-21 NOTE — ED Provider Triage Note (Signed)
Emergency Medicine Provider Triage Evaluation Note ? ?[name] Dyer , a 88 y.o. female  was evaluated in triage.  Brought to the emergency department by her husband with reports of change in mental status, generalized weakness, and new tremors.  Patient states that symptoms have been present and progressively worsening since last Wednesday.  Patient recently was discontinued from her trazodone.  Patient reports that since then he has noticed the above symptoms.  At baseline patient is intermittently alert to person, place, and time.  At baseline patient is able to stand and ambulate with a walker.  Patient has had difficulty walking with her walker and has seemed more confused over the last week. ? ?Review of Systems  ?Positive:  ?Negative:  ? ?Unable to obtain due to patient's dementia ? ?Physical Exam  ?BP 135/70   Pulse 65   Temp 97.8 ?F (36.6 ?C) (Oral)   Resp 16   SpO2 100%  ?Gen:   Awake, no distress   ?Resp:  Normal effort  ?MSK:   Moves extremities without difficulty  ?Other:  Tremor noted to bilateral upper extremities. ? ?Medical Decision Making  ?Medically screening exam initiated at 5:42 PM.  Appropriate orders placed.  [name] Dyer was informed that the remainder of the evaluation will be completed by another provider, this initial triage assessment does not replace that evaluation, and the importance of remaining in the ED until their evaluation is complete. ? ? ?  ?Loni Beckwith, PA-C ?04/21/22 1744 ? ?

## 2022-04-21 NOTE — ED Notes (Signed)
Pt called for triage no response x1 ?

## 2022-04-21 NOTE — Telephone Encounter (Signed)
LMOM to return call.

## 2022-04-21 NOTE — Telephone Encounter (Signed)
Patient son [name] called and Stated that patient has declined since Wednesday, stated that she is having a hard time walking. Takes a few steps and stops and can't go.  ?Tremor and shaking in hands so bad that she cannot hold a water bottle to take medications.  ?If she is left in a room by herself she is lost and don't know what to do or where to go. Almost Non Functional.  ?Feels like all this occurred after she stopped taking Trazodone. Is this a side effect? Needs direction.  ? ?Please Advise.  ?

## 2022-04-21 NOTE — ED Notes (Signed)
Patient incont. Changed brief ?

## 2022-04-21 NOTE — Telephone Encounter (Signed)
Recommend ED evaluation if patient unable to walk or hold anything.  ?

## 2022-04-21 NOTE — Telephone Encounter (Signed)
Patient son, [name] Notified and agreed.  ?

## 2022-04-22 ENCOUNTER — Observation Stay (HOSPITAL_COMMUNITY): Payer: Medicare Other

## 2022-04-22 ENCOUNTER — Encounter (HOSPITAL_COMMUNITY): Payer: Self-pay | Admitting: Internal Medicine

## 2022-04-22 ENCOUNTER — Inpatient Hospital Stay (HOSPITAL_COMMUNITY): Payer: Medicare Other

## 2022-04-22 ENCOUNTER — Emergency Department (HOSPITAL_COMMUNITY): Payer: Medicare Other

## 2022-04-22 DIAGNOSIS — N3001 Acute cystitis with hematuria: Secondary | ICD-10-CM

## 2022-04-22 DIAGNOSIS — N1832 Chronic kidney disease, stage 3b: Secondary | ICD-10-CM | POA: Diagnosis present

## 2022-04-22 DIAGNOSIS — G629 Polyneuropathy, unspecified: Secondary | ICD-10-CM

## 2022-04-22 DIAGNOSIS — I639 Cerebral infarction, unspecified: Secondary | ICD-10-CM | POA: Insufficient documentation

## 2022-04-22 DIAGNOSIS — M810 Age-related osteoporosis without current pathological fracture: Secondary | ICD-10-CM | POA: Diagnosis present

## 2022-04-22 DIAGNOSIS — Z7901 Long term (current) use of anticoagulants: Secondary | ICD-10-CM | POA: Diagnosis not present

## 2022-04-22 DIAGNOSIS — I5032 Chronic diastolic (congestive) heart failure: Secondary | ICD-10-CM | POA: Diagnosis present

## 2022-04-22 DIAGNOSIS — R29702 NIHSS score 2: Secondary | ICD-10-CM | POA: Diagnosis present

## 2022-04-22 DIAGNOSIS — N390 Urinary tract infection, site not specified: Secondary | ICD-10-CM | POA: Diagnosis present

## 2022-04-22 DIAGNOSIS — N179 Acute kidney failure, unspecified: Secondary | ICD-10-CM | POA: Diagnosis present

## 2022-04-22 DIAGNOSIS — B9689 Other specified bacterial agents as the cause of diseases classified elsewhere: Secondary | ICD-10-CM

## 2022-04-22 DIAGNOSIS — I63542 Cerebral infarction due to unspecified occlusion or stenosis of left cerebellar artery: Secondary | ICD-10-CM | POA: Diagnosis present

## 2022-04-22 DIAGNOSIS — F0280 Dementia in other diseases classified elsewhere without behavioral disturbance: Secondary | ICD-10-CM

## 2022-04-22 DIAGNOSIS — B961 Klebsiella pneumoniae [K. pneumoniae] as the cause of diseases classified elsewhere: Secondary | ICD-10-CM | POA: Diagnosis present

## 2022-04-22 DIAGNOSIS — B954 Other streptococcus as the cause of diseases classified elsewhere: Secondary | ICD-10-CM | POA: Diagnosis present

## 2022-04-22 DIAGNOSIS — I10 Essential (primary) hypertension: Secondary | ICD-10-CM

## 2022-04-22 DIAGNOSIS — Z1612 Extended spectrum beta lactamase (ESBL) resistance: Secondary | ICD-10-CM | POA: Diagnosis present

## 2022-04-22 DIAGNOSIS — E785 Hyperlipidemia, unspecified: Secondary | ICD-10-CM | POA: Diagnosis present

## 2022-04-22 DIAGNOSIS — I4821 Permanent atrial fibrillation: Secondary | ICD-10-CM

## 2022-04-22 DIAGNOSIS — Z881 Allergy status to other antibiotic agents status: Secondary | ICD-10-CM | POA: Diagnosis not present

## 2022-04-22 DIAGNOSIS — R269 Unspecified abnormalities of gait and mobility: Secondary | ICD-10-CM | POA: Diagnosis present

## 2022-04-22 DIAGNOSIS — Z888 Allergy status to other drugs, medicaments and biological substances status: Secondary | ICD-10-CM | POA: Diagnosis not present

## 2022-04-22 DIAGNOSIS — E876 Hypokalemia: Secondary | ICD-10-CM | POA: Diagnosis not present

## 2022-04-22 DIAGNOSIS — G309 Alzheimer's disease, unspecified: Secondary | ICD-10-CM | POA: Diagnosis present

## 2022-04-22 DIAGNOSIS — G9341 Metabolic encephalopathy: Secondary | ICD-10-CM | POA: Diagnosis present

## 2022-04-22 DIAGNOSIS — N189 Chronic kidney disease, unspecified: Secondary | ICD-10-CM

## 2022-04-22 DIAGNOSIS — I130 Hypertensive heart and chronic kidney disease with heart failure and stage 1 through stage 4 chronic kidney disease, or unspecified chronic kidney disease: Secondary | ICD-10-CM | POA: Diagnosis present

## 2022-04-22 DIAGNOSIS — F02818 Dementia in other diseases classified elsewhere, unspecified severity, with other behavioral disturbance: Secondary | ICD-10-CM | POA: Diagnosis present

## 2022-04-22 DIAGNOSIS — Z79899 Other long term (current) drug therapy: Secondary | ICD-10-CM | POA: Diagnosis not present

## 2022-04-22 DIAGNOSIS — Z882 Allergy status to sulfonamides status: Secondary | ICD-10-CM | POA: Diagnosis not present

## 2022-04-22 LAB — URINALYSIS, ROUTINE W REFLEX MICROSCOPIC
Bilirubin Urine: NEGATIVE
Glucose, UA: NEGATIVE mg/dL
Ketones, ur: NEGATIVE mg/dL
Nitrite: NEGATIVE
Protein, ur: 30 mg/dL — AB
Specific Gravity, Urine: 1.013 (ref 1.005–1.030)
WBC, UA: >50 WBC/hpf — ABNORMAL HIGH (ref 0–5)
pH: 5 (ref 5.0–8.0)

## 2022-04-22 LAB — CBG MONITORING, ED: Glucose-Capillary: 134 mg/dL — ABNORMAL HIGH (ref 70–99)

## 2022-04-22 LAB — HEMOGLOBIN A1C
Hgb A1c MFr Bld: 5.9 % — ABNORMAL HIGH (ref 4.8–5.6)
Mean Plasma Glucose: 122.63 mg/dL

## 2022-04-22 LAB — AMMONIA: Ammonia: 22 umol/L (ref 9–35)

## 2022-04-22 LAB — LIPID PANEL
Cholesterol: 112 mg/dL (ref 0–200)
HDL: 30 mg/dL — ABNORMAL LOW (ref >40)
LDL Cholesterol: 66 mg/dL (ref 0–99)
Total CHOL/HDL Ratio: 3.7 RATIO
Triglycerides: 79 mg/dL (ref <150)
VLDL: 16 mg/dL (ref 0–40)

## 2022-04-22 LAB — CREATININE, SERUM
Creatinine, Ser: 2.13 mg/dL — ABNORMAL HIGH (ref 0.44–1.00)
GFR, Estimated: 22 mL/min — ABNORMAL LOW (ref >60)

## 2022-04-22 LAB — CREATININE, URINE, RANDOM: Creatinine, Urine: 90.29 mg/dL

## 2022-04-22 LAB — SODIUM, URINE, RANDOM: Sodium, Ur: 12 mmol/L

## 2022-04-22 MED ORDER — ALLOPURINOL 100 MG PO TABS
100.0000 mg | ORAL_TABLET | Freq: Every day | ORAL | Status: DC
  Administered 2022-04-22 – 2022-04-27 (×6): 100 mg via ORAL
  Filled 2022-04-22 (×6): qty 1

## 2022-04-22 MED ORDER — STROKE: EARLY STAGES OF RECOVERY BOOK: Freq: Once | Status: AC

## 2022-04-22 MED ORDER — GABAPENTIN 300 MG PO CAPS
300.0000 mg | ORAL_CAPSULE | Freq: Three times a day (TID) | ORAL | Status: DC
  Administered 2022-04-22 – 2022-04-27 (×14): 300 mg via ORAL
  Filled 2022-04-22 (×14): qty 1

## 2022-04-22 MED ORDER — MEMANTINE HCL 10 MG PO TABS
5.0000 mg | ORAL_TABLET | Freq: Two times a day (BID) | ORAL | Status: DC
  Administered 2022-04-22 – 2022-04-27 (×10): 5 mg via ORAL
  Filled 2022-04-22 (×10): qty 1

## 2022-04-22 MED ORDER — MUSCLE RUB 10-15 % EX CREA
1.0000 "application " | TOPICAL_CREAM | Freq: Two times a day (BID) | CUTANEOUS | Status: DC | PRN
  Administered 2022-04-25: 1 via TOPICAL
  Filled 2022-04-22: qty 85

## 2022-04-22 MED ORDER — DONEPEZIL HCL 10 MG PO TABS
10.0000 mg | ORAL_TABLET | Freq: Every day | ORAL | Status: DC
  Administered 2022-04-22 – 2022-04-26 (×5): 10 mg via ORAL
  Filled 2022-04-22 (×5): qty 1

## 2022-04-22 MED ORDER — ENOXAPARIN SODIUM 40 MG/0.4ML IJ SOSY: 40.0000 mg | PREFILLED_SYRINGE | INTRAMUSCULAR | Status: DC

## 2022-04-22 MED ORDER — SODIUM CHLORIDE 0.9 % IV SOLN
1.0000 g | INTRAVENOUS | Status: DC
  Administered 2022-04-23 – 2022-04-24 (×2): 1 g via INTRAVENOUS
  Filled 2022-04-22 (×2): qty 10

## 2022-04-22 MED ORDER — SODIUM CHLORIDE 0.9 % IV SOLN: INTRAVENOUS | Status: DC

## 2022-04-22 MED ORDER — ACETAMINOPHEN 160 MG/5ML PO SOLN
650.0000 mg | ORAL | Status: DC | PRN
Start: 2022-04-22 — End: 2022-04-27

## 2022-04-22 MED ORDER — RIVAROXABAN 15 MG PO TABS
15.0000 mg | ORAL_TABLET | Freq: Every day | ORAL | Status: DC
  Administered 2022-04-22 – 2022-04-26 (×5): 15 mg via ORAL
  Filled 2022-04-22 (×5): qty 1

## 2022-04-22 MED ORDER — ACETAMINOPHEN 650 MG RE SUPP: 650.0000 mg | RECTAL | Status: DC | PRN

## 2022-04-22 MED ORDER — ROSUVASTATIN CALCIUM 5 MG PO TABS
5.0000 mg | ORAL_TABLET | Freq: Every day | ORAL | Status: DC
  Administered 2022-04-22 – 2022-04-27 (×6): 5 mg via ORAL
  Filled 2022-04-22 (×6): qty 1

## 2022-04-22 MED ORDER — ATENOLOL 25 MG PO TABS
50.0000 mg | ORAL_TABLET | Freq: Two times a day (BID) | ORAL | Status: DC
  Administered 2022-04-22 – 2022-04-27 (×10): 50 mg via ORAL
  Filled 2022-04-22 (×10): qty 2

## 2022-04-22 MED ORDER — TRAZODONE HCL 50 MG PO TABS: 50.0000 mg | ORAL_TABLET | Freq: Every evening | ORAL | Status: DC | PRN

## 2022-04-22 MED ORDER — SODIUM CHLORIDE 0.9 % IV SOLN: INTRAVENOUS | Status: AC

## 2022-04-22 MED ORDER — CEFTRIAXONE SODIUM 1 G IJ SOLR
1.0000 g | Freq: Once | INTRAMUSCULAR | Status: AC
  Administered 2022-04-22: 1 g via INTRAVENOUS
  Filled 2022-04-22: qty 10

## 2022-04-22 MED ORDER — ACETAMINOPHEN 325 MG PO TABS
650.0000 mg | ORAL_TABLET | ORAL | Status: DC | PRN
  Administered 2022-04-23 – 2022-04-26 (×3): 650 mg via ORAL
  Filled 2022-04-22 (×3): qty 2

## 2022-04-22 MED ORDER — VITAMIN B-12 1000 MCG PO TABS
1000.0000 ug | ORAL_TABLET | Freq: Every day | ORAL | Status: DC
  Administered 2022-04-22 – 2022-04-27 (×6): 1000 ug via ORAL
  Filled 2022-04-22 (×6): qty 1

## 2022-04-22 NOTE — ED Notes (Signed)
Brief changed.

## 2022-04-22 NOTE — ED Notes (Signed)
Pt transported to [facility] 

## 2022-04-22 NOTE — ED Notes (Signed)
[name] family is requesting that no one enter the pt's room without a mask including drs, nurses, therapy etc. This placed a sign on pt's door and will continue to monitor.  ?

## 2022-04-22 NOTE — ED Notes (Signed)
Pt transported to [facility].  ?

## 2022-04-22 NOTE — Plan of Care (Signed)
?  Problem: Education: ?Goal: Knowledge of General Education information will improve ?Description: Including pain rating scale, medication(s)/side effects and non-pharmacologic comfort measures ?Outcome: Progressing ?  ?Problem: Health Behavior/Discharge Planning: ?Goal: Ability to manage health-related needs will improve ?Outcome: Progressing ?  ?Problem: Clinical Measurements: ?Goal: Ability to maintain clinical measurements within normal limits will improve ?Outcome: Progressing ?Goal: Will remain free from infection ?Outcome: Progressing ?Goal: Diagnostic test results will improve ?Outcome: Progressing ?Goal: Respiratory complications will improve ?Outcome: Progressing ?Goal: Cardiovascular complication will be avoided ?Outcome: Progressing ?  ?Problem: Activity: ?Goal: Risk for activity intolerance will decrease ?Outcome: Progressing ?  ?Problem: Nutrition: ?Goal: Adequate nutrition will be maintained ?Outcome: Progressing ?  ?Problem: Coping: ?Goal: Level of anxiety will decrease ?Outcome: Progressing ?  ?Problem: Elimination: ?Goal: Will not experience complications related to bowel motility ?Outcome: Progressing ?Goal: Will not experience complications related to urinary retention ?Outcome: Progressing ?  ?Problem: Pain Managment: ?Goal: General experience of comfort will improve ?Outcome: Progressing ?  ?Problem: Safety: ?Goal: Ability to remain free from injury will improve ?Outcome: Progressing ?  ?Problem: Skin Integrity: ?Goal: Risk for impaired skin integrity will decrease ?Outcome: Progressing ?  ?

## 2022-04-22 NOTE — ED Provider Notes (Signed)
?[facility] ?Provider Note ? ? ?CSN: [id number] ?Arrival date & time: 04/21/22  1618 ? ?  ? ?History ? ?Chief Complaint  ?Patient presents with  ? Weakness  ? ? ?[name] Dyer is a 88 y.o. female. ? ?HPI ? ?  ? ?This is an 88 year old female with a history of congestive heart failure, hypertension, hyperlipidemia, dementia who presents with worsening weakness.  [name] son provides most of the history.  Reports over the last 3 to 4 days she has had worsening generalized weakness.  He states at baseline she is usually able to get around reasonably well with her walker.  She does have some sundowning at night but this seems to be worse.  He states that she will only take 2 steps and then seems to stop and be unsure of her footing.  He also states that she seems more confused than normal.  No recent illnesses ? ?Home Medications ?Prior to Admission medications   ?Medication Sig Start Date End Date Taking? Authorizing Provider  ?acetaminophen (TYLENOL) 500 MG tablet Take 1,000 mg by mouth every 6 (six) hours as needed for mild pain or headache.     [provider]  ?alendronate (FOSAMAX) 70 MG tablet Take 1 tablet (70 mg total) by mouth once a week. 01/20/22   Ngetich, [name] C, NP  ?allopurinol (ZYLOPRIM) 100 MG tablet Take 1 tablet (100 mg total) by mouth daily. 02/10/22   Ngetich, [name] C, NP  ?atenolol (TENORMIN) 50 MG tablet Take 1 tablet (50 mg total) by mouth 2 (two) times daily. 02/10/22   [name], MD  ?calcium carbonate (OS-CAL - DOSED IN MG OF ELEMENTAL CALCIUM) 1250 (500 Ca) MG tablet Take 1 tablet (1,250 mg total) by mouth in the morning. 01/18/22   Margie Billet, PA-C  ?cholecalciferol (VITAMIN D3) 25 MCG (1000 UT) tablet Take 2,000 Units by mouth daily.    [provider]  ?colchicine 0.6 MG tablet Take 1 tablet (0.6 mg total) by mouth daily as needed (As needed for gout flare). Follow up with primary care provider for refill 01/17/22    Margie Billet, PA-C  ?docusate sodium (COLACE) 100 MG capsule Take 1 capsule (100 mg total) by mouth 2 (two) times daily. 04/16/22   Ngetich, [name] C, NP  ?donepezil (ARICEPT) 10 MG tablet Take 1 tablet (10 mg total) by mouth at bedtime. 07/02/21   Suzzanne Cloud, NP  ?furosemide (LASIX) 40 MG tablet TAKE 40 MG DAILY ALTERNATING EVERY OTHER DAY WITH 60 MG (1 AND 1/2 TABLET)DAILY 02/10/22   [name], MD  ?gabapentin (NEURONTIN) 300 MG capsule TAKE 1 CAPSULE BY MOUTH THREE TIMES A DAY 02/24/22   Ngetich, [name] C, NP  ?ipratropium (ATROVENT) 0.06 % nasal spray CAN USE 2 SPRAYS IN EACH NOSTRIL EVERY 6 HOURS IF NEEDED TO DRY UP RUNNY N0SE 12/04/20   Kozlow, Donnamarie Poag, MD  ?Melatonin 10 MG TABS Take 10-20 mg by mouth at bedtime.    [provider]  ?memantine (NAMENDA) 5 MG tablet Take 1 tablet (5 mg total) by mouth 2 (two) times daily. 07/02/21   Suzzanne Cloud, NP  ?Menthol, Topical Analgesic, (BIOFREEZE EX) Apply 1 application topically daily as needed (pain).    [provider]  ?metolazone (ZAROXOLYN) 2.5 MG tablet TAKE ONE TABLET BY MOUTH TWICE WEEKLY - 30 MINUTES PRIOR TO TAKING YOUR 60MG DOSE OF FUROSEMIDE 04/10/22   [name], MD  ?mirabegron ER [facility]) 25  MG TB24 tablet Take 1 tablet (25 mg total) by mouth daily. ON HOLD 01/29/22 01/29/22   [name], MD  ?Multiple Vitamins-Minerals (MULTIPLE VITAMINS/WOMENS PO) Take 1 tablet by mouth daily.    [provider]  ?potassium chloride SA (KLOR-CON M) 20 MEQ tablet Take 1 tablet (20 mEq total) by mouth daily. 02/10/22   [name], MD  ?Rivaroxaban (XARELTO) 15 MG TABS tablet TAKE 1 TABLET (15 MG TOTAL) BY MOUTH DAILY WITH SUPPER. 01/08/22   [name], MD  ?rosuvastatin (CRESTOR) 5 MG tablet Take 1 tablet (5 mg total) by mouth daily. 02/10/22   [name], MD  ?traZODone (DESYREL) 50 MG tablet TAKE 1 TABLET EVERY DAY BY ORAL ROUTE AS NEEDED. 03/27/22   Ngetich, [name] C, NP  ?vitamin B-12 (CYANOCOBALAMIN) 1000 MCG tablet Take  1,000 mcg by mouth daily.    [provider]  ?vitamin C (ASCORBIC ACID) 250 MG tablet Take 250 mg by mouth daily.    [provider]  ?   ? ?Allergies    ?Ciprofloxacin, Sulfa antibiotics, Memantine, Succinylsulphathiazole, and Sulfamethoxazole   ? ?Review of Systems   ?Review of Systems  ?Constitutional:  Negative for fever.  ?Cardiovascular:  Negative for chest pain.  ?Musculoskeletal:  Positive for gait problem.  ?Psychiatric/Behavioral:  Positive for confusion.   ?All other systems reviewed and are negative. ? ?Physical Exam ?Updated Vital Signs ?BP 125/71   Pulse (!) 108   Temp 99 ?F (37.2 ?C) (Oral)   Resp (!) 29   SpO2 94%  ?Physical Exam ?Vitals and nursing note reviewed.  ?Constitutional:   ?   Appearance: She is well-developed.  ?   Comments: Elderly, chronically ill-appearing,  ?HENT:  ?   Head: Normocephalic and atraumatic.  ?   Mouth/Throat:  ?   Mouth: Mucous membranes are dry.  ?Eyes:  ?   Pupils: Pupils are equal, round, and reactive to light.  ?Cardiovascular:  ?   Rate and Rhythm: Regular rhythm. Tachycardia present.  ?   Heart sounds: Normal heart sounds.  ?Pulmonary:  ?   Effort: Pulmonary effort is normal. No respiratory distress.  ?   Breath sounds: No wheezing.  ?Abdominal:  ?   Palpations: Abdomen is soft.  ?Musculoskeletal:  ?   Cervical back: Neck supple.  ?Skin: ?   General: Skin is warm and dry.  ?Neurological:  ?   Mental Status: She is alert and oriented to person, place, and time.  ?   Comments: Oriented to herself, slight resting tremor noted, no drift, slow with finger-nose-finger, equal and symmetric strength in all 4 extremities, gait testing deferred  ?Psychiatric:     ?   Mood and Affect: Mood normal.  ? ? ?ED Results / Procedures / Treatments   ?Labs ?(all labs ordered are listed, but only abnormal results are displayed) ?Labs Reviewed  ?COMPREHENSIVE METABOLIC PANEL - Abnormal; Notable for the following components:  ?    Result Value  ? Chloride 91 (*)    ? Glucose, Bld 130 (*)   ? BUN 82 (*)   ? Creatinine, Ser 2.67 (*)   ? AST 48 (*)   ? Alkaline Phosphatase 148 (*)   ? Total Bilirubin 1.5 (*)   ? GFR, Estimated 17 (*)   ? Anion gap 18 (*)   ? All other components within normal limits  ?CBC WITH DIFFERENTIAL/PLATELET - Abnormal; Notable for the following components:  ? RDW 19.0 (*)   ? Neutro  Abs 8.0 (*)   ? Lymphs Abs 0.5 (*)   ? All other components within normal limits  ?URINALYSIS, ROUTINE W REFLEX MICROSCOPIC - Abnormal; Notable for the following components:  ? APPearance CLOUDY (*)   ? Hgb urine dipstick SMALL (*)   ? Protein, ur 30 (*)   ? Leukocytes,Ua LARGE (*)   ? WBC, UA >50 (*)   ? Bacteria, UA MANY (*)   ? All other components within normal limits  ?CBG MONITORING, ED - Abnormal; Notable for the following components:  ? Glucose-Capillary 134 (*)   ? All other components within normal limits  ?URINE CULTURE  ?AMMONIA  ? ? ?EKG ?None ? ?Radiology ?DG Chest 2 View ? ?Result Date: 04/21/2022 ?CLINICAL DATA:  Altered mental status. EXAM: CHEST - 2 VIEW COMPARISON:  Chest x-ray 01/09/2022. FINDINGS: The heart size and mediastinal contours are within normal limits. Both lungs are clear. The visualized skeletal structures are unremarkable. IMPRESSION: No active cardiopulmonary disease. Electronically Signed   By: Ronney Asters M.D.   On: 04/21/2022 18:32  ? ?CT HEAD WO CONTRAST (5MM) ? ?Result Date: 04/21/2022 ?CLINICAL DATA:  Mental status change, unknown cause. EXAM: CT HEAD WITHOUT CONTRAST TECHNIQUE: Contiguous axial images were obtained from the base of the skull through the vertex without intravenous contrast. RADIATION DOSE REDUCTION: This exam was performed according to the departmental dose-optimization program which includes automated exposure control, adjustment of the mA and/or kV according to patient size and/or use of iterative reconstruction technique. COMPARISON:  04/21/2017. FINDINGS: Brain: No acute intracranial hemorrhage, midline shift or  mass effect. No extra-axial fluid collection. Subcortical and periventricular white matter hypodensities are noted bilaterally. No hydrocephalus. Mild diffuse atrophy is noted. There are multifocal infarcts in t

## 2022-04-22 NOTE — H&P (Addendum)
?History and Physical  ? ? ?Patient: [name] Dyer DOB: 06/07/1932 ?DOA: 04/21/2022 ?DOS: the patient was seen and examined on 04/22/2022 ?PCP: Ngetich, [name], NP  ?Patient coming from: Home with son ? ?Chief Complaint:  ?Chief Complaint  ?Patient presents with  ? Weakness  ? ?HPI: [name] Dyer is a 88 y.o. female with medical history significant of hypertension, hyperlipidemia, CHF, A-fib on Xarelto, dementia, and presents with complaints of weakness over the last 3 to 4 days.  History is obtained from review of records, the patient, and talks with her son over the phone.  Normally patient able to ambulate with use of a walker.  Over the last couple of days she had been only able to take 1 or 2 steps before needing to stop.  Son notes that at baseline she has dementia, but is usually able to recognize family.  He recently patient had been seeming to be more confused and disoriented having difficulty recognizing her son at times.  Her son was concerned that symptoms were may be caused by trazodone which patient would take once or twice at night.  Therefore they had recently stopped the medication to see if it would help.  She has been compliant with her anticoagulation has not missed any doses.  The patient denies any complaints.  [name] Crestor dose had been decreased to 5 mg daily due to elevated liver enzymes. ? ?In the ED patient was seen to have a temperature of 99 ?F, heart rate 65-1 08, respirations 16-29, and all other vital signs maintained.  Labs noted platelet count clumping, BUN 82, creatinine 2.67, anion gap 18, alkaline phosphatase 148, AST 48, total bilirubin 1.5, and ammonia 22.  Chest x-ray noted no acute abnormality.  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  Patient was not a candidate for thrombolytics due to being on anticoagulation.  Urinalysis was  significant for large leukocytes, many bacteria, and greater than 50 WBCs.  MRI noted punctate subacute infarct of the left cerebellum.  Neurology recommended further work-up.  Patient having given Rocephin to treat for UTI and started on normal saline IV fluids.  Patient failed bedside swallow evaluation. ? ?Review of Systems: Review of systems limited secondary to dementia. ? ?Past Medical History:  ?Diagnosis Date  ? Alzheimer disease ([place])   ? Anemia   ? Arrhythmia   ? Cataract   ? Colon polyps   ? Community acquired pneumonia 01/2012  ? LLL  ? Congestive heart failure ([place])   ? Hyperlipidemia   ? Hypertension   ? Memory difficulty 10/01/2016  ? Osteoporosis   ? Pleural effusion, right 01/2012  ? SVT (supraventricular tachycardia) ([place]) 01/2012  ? UTI (lower urinary tract infection)   ? ?Past Surgical History:  ?Procedure Laterality Date  ? ABDOMINAL HYSTERECTOMY  1983  ? APPENDECTOMY  1983  ? COLONOSCOPY    ? COLONOSCOPY N/A 06/10/2016  ? Procedure: COLONOSCOPY;  Surgeon: Manus Gunning, MD;  Location: [facility];  Service: Gastroenterology;  Laterality: N/A;  ? ESOPHAGOGASTRODUODENOSCOPY N/A 06/10/2016  ? Procedure: ESOPHAGOGASTRODUODENOSCOPY (EGD);  Surgeon: Manus Gunning, MD;  Location: [facility];  Service: Gastroenterology;  Laterality: N/A;  ? HEMIARTHROPLASTY HIP  2011  ? Left femoral neck fracture  ? RIGHT/LEFT HEART CATH AND CORONARY ANGIOGRAPHY N/A 01/08/2022  ? Procedure: RIGHT/LEFT HEART CATH AND CORONARY ANGIOGRAPHY;  Surgeon: Leonie Man, MD;  Location: [facility];  Service: Cardiovascular;  Laterality: N/A;  ? UPPER GASTROINTESTINAL ENDOSCOPY    ? ?Social History:  reports that she has never smoked. She has never used smokeless tobacco. She reports that she does not drink alcohol and does not use drugs. ? ?Allergies  ?Allergen Reactions  ? Ciprofloxacin Diarrhea  ? Sulfa Antibiotics Hives and Rash  ?  Severe Rash  ? Memantine Other (See Comments)  ?  dizziness  ?  Succinylsulphathiazole Rash  ? Sulfamethoxazole Rash  ? ? ?Family History  ?Problem Relation Age of Onset  ? Heart Problems Mother   ? Osteoporosis Mother   ? Stroke Father   ? Heart Problems Father   ? Other Brother 38  ?     COVID  ? Colon polyps Son   ? Parkinson's disease Son   ? Neuropathy Son   ? Heart disease Son   ? Cancer Grandchild   ? ? ?Prior to Admission medications   ?Medication Sig Start Date End Date Taking? Authorizing Provider  ?acetaminophen (TYLENOL) 500 MG tablet Take 1,000 mg by mouth every 6 (six) hours as needed for mild pain or headache.     [provider]  ?alendronate (FOSAMAX) 70 MG tablet Take 1 tablet (70 mg total) by mouth once a week. 01/20/22   Ngetich, [name] C, NP  ?allopurinol (ZYLOPRIM) 100 MG tablet Take 1 tablet (100 mg total) by mouth daily. 02/10/22   Ngetich, [name] C, NP  ?atenolol (TENORMIN) 50 MG tablet Take 1 tablet (50 mg total) by mouth 2 (two) times daily. 02/10/22   [name], MD  ?calcium carbonate (OS-CAL - DOSED IN MG OF ELEMENTAL CALCIUM) 1250 (500 Ca) MG tablet Take 1 tablet (1,250 mg total) by mouth in the morning. 01/18/22   Margie Billet, PA-C  ?cholecalciferol (VITAMIN D3) 25 MCG (1000 UT) tablet Take 2,000 Units by mouth daily.    [provider]  ?colchicine 0.6 MG tablet Take 1 tablet (0.6 mg total) by mouth daily as needed (As needed for gout flare). Follow up with primary care provider for refill 01/17/22   Margie Billet, PA-C  ?docusate sodium (COLACE) 100 MG capsule Take 1 capsule (100 mg total) by mouth 2 (two) times daily. 04/16/22   Ngetich, [name] C, NP  ?donepezil (ARICEPT) 10 MG tablet Take 1 tablet (10 mg total) by mouth at bedtime. 07/02/21   Suzzanne Cloud, NP  ?furosemide (LASIX) 40 MG tablet TAKE 40 MG DAILY ALTERNATING EVERY OTHER DAY WITH 60 MG (1 AND 1/2 TABLET)DAILY 02/10/22   [name], MD  ?gabapentin (NEURONTIN) 300 MG capsule TAKE 1 CAPSULE BY MOUTH THREE TIMES A DAY 02/24/22   Ngetich, [name] C, NP   ?ipratropium (ATROVENT) 0.06 % nasal spray CAN USE 2 SPRAYS IN EACH NOSTRIL EVERY 6 HOURS IF NEEDED TO DRY UP RUNNY N0SE 12/04/20   Kozlow, Donnamarie Poag, MD  ?Melatonin 10 MG TABS Take 10-20 mg by mouth at bedtime.    [provider]  ?memantine (NAMENDA) 5 MG tablet Take 1 tablet (5 mg total) by mouth 2 (two) times daily. 07/02/21   Suzzanne Cloud, NP  ?Menthol, Topical Analgesic, (BIOFREEZE EX) Apply 1 application topically daily as needed (pain).    [provider]  ?metolazone (ZAROXOLYN) 2.5 MG tablet TAKE ONE TABLET BY MOUTH TWICE WEEKLY - 30 MINUTES PRIOR TO TAKING YOUR 60MG DOSE OF FUROSEMIDE 04/10/22   [name], MD  ?mirabegron [facility]) 25  MG TB24 tablet Take 1 tablet (25 mg total) by mouth daily. ON HOLD 01/29/22 01/29/22   [name], MD  ?Multiple Vitamins-Minerals (MULTIPLE VITAMINS/WOMENS PO) Take 1 tablet by mouth daily.    [provider]  ?potassium chloride SA (KLOR-CON M) 20 MEQ tablet Take 1 tablet (20 mEq total) by mouth daily. 02/10/22   [name], MD  ?Rivaroxaban (XARELTO) 15 MG TABS tablet TAKE 1 TABLET (15 MG TOTAL) BY MOUTH DAILY WITH SUPPER. 01/08/22   [name], MD  ?rosuvastatin (CRESTOR) 5 MG tablet Take 1 tablet (5 mg total) by mouth daily. 02/10/22   [name], MD  ?traZODone (DESYREL) 50 MG tablet TAKE 1 TABLET EVERY DAY BY ORAL ROUTE AS NEEDED. 03/27/22   Ngetich, [name] C, NP  ?vitamin B-12 (CYANOCOBALAMIN) 1000 MCG tablet Take 1,000 mcg by mouth daily.    [provider]  ?vitamin C (ASCORBIC ACID) 250 MG tablet Take 250 mg by mouth daily.    [provider]  ? ? ?Physical Exam: ?Vitals:  ? 04/22/22 0147 04/22/22 0245 04/22/22 0330 04/22/22 0345  ?BP: (!) 141/71 (!) 142/69 128/70 125/71  ?Pulse: 92 (!) 107 93 (!) 108  ?Resp: _0 (!) 29  ?Temp:      ?TempSrc:      ?SpO2: 95% 95% 98% 94%  ? ?Constitutional: Elderly female currently in no acute distress ?Eyes: PERRL, lids and conjunctivae normal ?ENMT: Mucous membranes are  moist. Posterior pharynx clear of any exudate or lesions.  ?Neck: normal, supple, no masses, no thyromegaly ?Respiratory: clear to auscultation bilaterally, no wheezing, no crackles. Normal respiratory effort. No accessor

## 2022-04-22 NOTE — Evaluation (Signed)
Clinical/Bedside Swallow Evaluation ?Patient Details  ?Name: [name] Dyer ?MRN: [id number] ?Date of Birth: 1932-06-09 ? ?Today's Date: 04/22/2022 ?Time: SLP Start Time (ACUTE ONLY): 1157 SLP Stop Time (ACUTE ONLY): 0930 ?SLP Time Calculation (min) (ACUTE ONLY): 10 min ? ?Past Medical History:  ?Past Medical History:  ?Diagnosis Date  ? Alzheimer disease ([place])   ? Anemia   ? Arrhythmia   ? Cataract   ? Colon polyps   ? Community acquired pneumonia 01/2012  ? LLL  ? Congestive heart failure ([place])   ? Hyperlipidemia   ? Hypertension   ? Memory difficulty 10/01/2016  ? Osteoporosis   ? Pleural effusion, right 01/2012  ? SVT (supraventricular tachycardia) ([place]) 01/2012  ? UTI (lower urinary tract infection)   ? ?Past Surgical History:  ?Past Surgical History:  ?Procedure Laterality Date  ? ABDOMINAL HYSTERECTOMY  1983  ? APPENDECTOMY  1983  ? COLONOSCOPY    ? COLONOSCOPY N/A 06/10/2016  ? Procedure: COLONOSCOPY;  Surgeon: Manus Gunning, MD;  Location: [facility];  Service: Gastroenterology;  Laterality: N/A;  ? ESOPHAGOGASTRODUODENOSCOPY N/A 06/10/2016  ? Procedure: ESOPHAGOGASTRODUODENOSCOPY (EGD);  Surgeon: Manus Gunning, MD;  Location: [facility];  Service: Gastroenterology;  Laterality: N/A;  ? HEMIARTHROPLASTY HIP  2011  ? Left femoral neck fracture  ? RIGHT/LEFT HEART CATH AND CORONARY ANGIOGRAPHY N/A 01/08/2022  ? Procedure: RIGHT/LEFT HEART CATH AND CORONARY ANGIOGRAPHY;  Surgeon: Leonie Man, MD;  Location: [facility];  Service: Cardiovascular;  Laterality: N/A;  ? UPPER GASTROINTESTINAL ENDOSCOPY    ? ?HPI:  ?[name] Dyer is a 88 y.o. female with PMH significant for HTN, HLD, osteoporosis, Afibb on Xarelto, chronic memory disturbance, who presents with generalized weakness.  Workup in the ED with MRI demonstrating a punctate L cerebellum infarct.  Failed [facility].  Pt had MBS in 1/23 "[name] oropharyngeal swallowing is functional with adequate efficiency and no aspiration. She  had intermittent throat clearing and coughing throughout the study despite no aspiration occurring. She did appear to have barium, including barium tablet, remaining in her esophagus upon completion of the study."  ?  ?Assessment / Plan / Recommendation  ?Clinical Impression ? Pt demonstrates adequate ability to eat and drink consistent with MBS completed three months ago at this hospital. Pt has no acute change in function, tolerates thin liquids and solids with occasional throat clearing as seen on MBS. Pt had intermittent cough and throat clear without airway invasion. There is the possibility of esophageal dysphagia. F/u with gastroenterology or with esophagram would be appropriate to address this. No SLP f/u or diet modification needed at this time. ?SLP Visit Diagnosis: Dysphagia, unspecified (R13.10) ?   ?Aspiration Risk ? Mild aspiration risk  ?  ?Diet Recommendation Regular;Thin liquid  ? ?Liquid Administration via: Cup;Straw ?Medication Administration: Whole meds with liquid  ?  ?Other  Recommendations     ? ?Recommendations for follow up therapy are one component of a multi-disciplinary discharge planning process, led by the attending physician.  Recommendations may be updated based on patient status, additional functional criteria and insurance authorization. ? ?Follow up Recommendations No SLP follow up  ? ? ?  ?Assistance Recommended at Discharge    ?Functional Status Assessment    ?Frequency and Duration    ?  ?  ?   ? ?Prognosis    ? ?  ? ?Swallow Study   ?General HPI: [name] Dyer is a 88 y.o. female with PMH significant for HTN, HLD, osteoporosis, Afibb on  Xarelto, chronic memory disturbance, who presents with generalized weakness.  Workup in the ED with MRI demonstrating a punctate L cerebellum infarct.  Failed [facility].  Pt had MBS in 1/23 "[name] oropharyngeal swallowing is functional with adequate efficiency and no aspiration. She had intermittent throat clearing and coughing throughout the  study despite no aspiration occurring. She did appear to have barium, including barium tablet, remaining in her esophagus upon completion of the study." ?Type of Study: Bedside Swallow Evaluation ?Previous Swallow Assessment: none ?Diet Prior to this Study: NPO ?Temperature Spikes Noted: No ?Respiratory Status: Room air ?History of Recent Intubation: No ?Behavior/Cognition: Alert;Cooperative;Pleasant mood ?Oral Cavity Assessment: Within Functional Limits ?Oral Care Completed by SLP: No ?Oral Cavity - Dentition: Adequate natural dentition ?Vision: Functional for self-feeding ?Self-Feeding Abilities: Able to feed self;Needs assist ?Patient Positioning: Upright in bed ?Baseline Vocal Quality: Normal ?Volitional Cough: Strong ?Volitional Swallow: Able to elicit  ?  ?Oral/Motor/Sensory Function Overall Oral Motor/Sensory Function: Within functional limits   ?Ice Chips     ?Thin Liquid Thin Liquid: Within functional limits  ?  ?Nectar Thick Nectar Thick Liquid: Not tested   ?Honey Thick Honey Thick Liquid: Not tested   ?Puree Puree: Within functional limits   ?Solid ? ? ?  Solid: Within functional limits  ? ?  ? ?Jodie Leiner, Katherene Ponto ?04/22/2022,11:18 AM ? ? ? ? ?

## 2022-04-22 NOTE — Progress Notes (Signed)
PT Cancellation Note ? ?Patient Details ?Name: [name] Dyer ?MRN: [id number] ?DOB: Oct 17, 1932 ? ? ?Cancelled Treatment:    Reason Eval/Treat Not Completed: Fatigue/lethargy limiting ability to participate.  Lethargic from another therapy session, retry at another time. ? ? ?Ramond Dial ?04/22/2022, 12:48 PM ? ?Mee Hives, PT PhD ?Acute Rehab Dept. Number: [facility] [phone] and [place] [phone] ? ?

## 2022-04-22 NOTE — Evaluation (Signed)
Physical Therapy Evaluation ?Patient Details ?Name: [name] Dyer ?MRN: [id number] ?DOB: 1932-01-05 ?Today's Date: 04/22/2022 ? ?History of Present Illness ? Patient is a 88 y/o female who presents on 4/24 for new UE tremors, gait changes and difficulty directing her own behavior.  Pt has AMS, negative MRI acute changes on vessels in brain and brain itself.  PMHx: CHF, A-fib,HTN, dementia, atherosclerosis, stenosis of brain vessels, L cerebellar subacute punctate stroke,  ?Clinical Impression ? Pt was seen for mobility with nursing in attendance for part of the session.  Pt is incontinent, confused about sequence of movement and has per her chart been more independent than currently demonstrating.  Follow up with her to send to SNF for her recovery process of gait and transfers, which were independent on a walker if pt is able to give reliable information.  Has apparently lived alone, and unsure of how much family and caregiver support pt has.  Follow for acute PT goals as are outlined below.   ?   ? ?Recommendations for follow up therapy are one component of a multi-disciplinary discharge planning process, led by the attending physician.  Recommendations may be updated based on patient status, additional functional criteria and insurance authorization. ? ?Follow Up Recommendations Skilled nursing-short term rehab (<3 hours/day) ? ?  ?Assistance Recommended at Discharge Frequent or constant Supervision/Assistance  ?Patient can return home with the following ? A lot of help with walking and/or transfers;A lot of help with bathing/dressing/bathroom;Assistance with cooking/housework;Direct supervision/assist for medications management;Direct supervision/assist for financial management;Assist for transportation;Help with stairs or ramp for entrance ? ?  ?Equipment Recommendations None recommended by PT  ?Recommendations for Other Services ?    ?  ?Functional Status Assessment Patient has had a recent decline in their  functional status and demonstrates the ability to make significant improvements in function in a reasonable and predictable amount of time.  ? ?  ?Precautions / Restrictions Precautions ?Precautions: Fall ?Precaution Comments: monitor O2 sats and HR ?Restrictions ?Weight Bearing Restrictions: No  ? ?  ? ?Mobility ? Bed Mobility ?Overal bed mobility: Needs Assistance ?Bed Mobility: Supine to Sit, Sit to Supine ?  ?  ?Supine to sit: Mod assist ?Sit to supine: Max assist ?  ?General bed mobility comments: max due to pt becoming unable to follow directions and in fact being a bit resistive to return to bed ?  ? ?Transfers ?Overall transfer level: Needs assistance ?Equipment used: Rolling walker (2 wheels), 1 person hand held assist ?Transfers: Sit to/from Stand ?Sit to Stand: Mod assist ?  ?  ?  ?  ?  ?General transfer comment: mod assist with dense cues for sequence and hand placemetn ?  ? ?Ambulation/Gait ?  ?  ?  ?  ?  ?  ?  ?General Gait Details: unable to safely walk due to fluctuations in sats ? ?Stairs ?  ?  ?  ?  ?  ? ?Wheelchair Mobility ?  ? ?Modified Rankin (Stroke Patients Only) ?  ? ?  ? ?Balance Overall balance assessment: Needs assistance, History of Falls ?Sitting-balance support: Feet supported ?Sitting balance-Leahy Scale: Fair ?  ?Postural control: Posterior lean, Right lateral lean ?Standing balance support: During functional activity, Bilateral upper extremity supported ?Standing balance-Leahy Scale: Poor ?  ?  ?  ?  ?  ?  ?  ?  ?  ?  ?  ?  ?   ? ? ? ?Pertinent Vitals/Pain Pain Assessment ?Pain Assessment: No/denies pain  ? ? ?Home Living  Family/patient expects to be discharged to:: Unsure ?Living Arrangements: Alone ?Available Help at Discharge: Family;Available PRN/intermittently ?Type of Home: House ?Home Access: Ramped entrance ?  ?  ?  ?Home Layout: One level ?Home Equipment: Cane - single point;Grab bars - tub/shower;Hand held shower head;Rolling Walker (2 wheels);Rollator (4  wheels);BSC/3in1 ?Additional Comments: has been home with sons helping in day time and home alone at night per previous chart history.  Pt cannot give PT any current history  ?  ?Prior Function Prior Level of Function : Needs assist ?  ?  ?  ?Physical Assist : Mobility (physical) ?Mobility (physical): Gait ?  ?Mobility Comments: RW per pt and chart history ?ADLs Comments: pt reports her children do the driving ?  ? ? ?Hand Dominance  ? Dominant Hand: Right ? ?  ?Extremity/Trunk Assessment  ? Upper Extremity Assessment ?Upper Extremity Assessment: Generalized weakness ?  ? ?Lower Extremity Assessment ?Lower Extremity Assessment: Generalized weakness ?  ? ?Cervical / Trunk Assessment ?Cervical / Trunk Assessment: Kyphotic  ?Communication  ? Communication: No difficulties  ?Cognition Arousal/Alertness: Lethargic ?Behavior During Therapy: Flat affect ?Overall Cognitive Status: No family/caregiver present to determine baseline cognitive functioning ?  ?  ?  ?  ?  ?  ?  ?  ?  ?  ?  ?  ?  ?  ?  ?  ?General Comments: unclear how close to baseline she is ?  ?  ? ?  ?General Comments General comments (skin integrity, edema, etc.): pt is fluctuating on sats, from 85% to 100% with nursing in to observe and report that pt is having difficulty with readings due to fit of finger probe, cool fingers ? ?  ?Exercises    ? ?Assessment/Plan  ?  ?PT Assessment Patient needs continued PT services  ?PT Problem List Decreased strength;Decreased range of motion;Decreased activity tolerance;Decreased balance;Decreased mobility;Decreased coordination;Decreased cognition;Decreased knowledge of use of DME;Decreased safety awareness;Cardiopulmonary status limiting activity ? ?   ?  ?PT Treatment Interventions DME instruction;Gait training;Functional mobility training;Therapeutic activities;Therapeutic exercise;Balance training;Neuromuscular re-education;Patient/family education   ? ?PT Goals (Current goals can be found in the Care Plan section)   ?Acute Rehab PT Goals ?Patient Stated Goal: did not state ?PT Goal Formulation: Patient unable to participate in goal setting ?Time For Goal Achievement: 05/06/22 ?Potential to Achieve Goals: Fair ? ?  ?Frequency Min 2X/week ?  ? ? ?Co-evaluation   ?  ?  ?  ?  ? ? ?  ?AM-PAC PT "6 Clicks" Mobility  ?Outcome Measure Help needed turning from your back to your side while in a flat bed without using bedrails?: A Lot ?Help needed moving from lying on your back to sitting on the side of a flat bed without using bedrails?: A Lot ?Help needed moving to and from a bed to a chair (including a wheelchair)?: A Lot ?Help needed standing up from a chair using your arms (e.g., wheelchair or bedside chair)?: A Lot ?Help needed to walk in hospital room?: A Lot ?Help needed climbing 3-5 steps with a railing? : Total ?6 Click Score: 11 ? ?  ?End of Session Equipment Utilized During Treatment: Gait belt;Oxygen ?Activity Tolerance: Patient limited by fatigue;Treatment limited secondary to medical complications (Comment) ?Patient left: in bed;with call bell/phone within reach;with bed alarm set;with nursing/sitter in room ?Nurse Communication: Mobility status;Other (comment) (discussion about vitals and incontience) ?PT Visit Diagnosis: Unsteadiness on feet (R26.81);Muscle weakness (generalized) (M62.81);Difficulty in walking, not elsewhere classified (R26.2);Other (comment) (incontinence) ?  ? ?Time: 9678-9381 ?PT  Time Calculation (min) (ACUTE ONLY): 39 min ? ? ?Charges:   PT Evaluation ?$PT Eval Moderate Complexity: 1 Mod ?PT Treatments ?$Therapeutic Activity: 23-37 mins ?  ?   ? ?Ramond Dial ?04/22/2022, 6:44 PM ? ?Mee Hives, PT PhD ?Acute Rehab Dept. Number: [facility] [phone] and [place] [phone] ? ? ?

## 2022-04-22 NOTE — Consult Note (Addendum)
NEUROLOGY CONSULTATION NOTE  ? ?Date of service: April 22, 2022 ?Patient Name: [name] Dyer ?MRN:  [id number] ?DOB:  August 15, 1932 ?Reason for consult: "encephalopathy, generalized weakness" ?Requesting Provider: Merryl Hacker, MD ?_ _ _   _ __   _ __ _ _  __ __   _ __   __ _ ? ?History of Present Illness  ?[name] Dyer is a 88 y.o. female with PMH significant for HTN, HLD, osteoporosis, Afibb on Xarelto, chronic memory disturbance, who presents with generalized weakness. ? ?Usually walks with her walker and over the last few days, would take a couple steps and then stop. Also been more confused than normal. ? ?At baseline, she is conversational. Sons tell her the date and month. Sons also report small issues with bleeding from her nose from time to time but not very concerning and mostly just spotting. Sons also report that PCP has recently cut down on crestor due to elevated liver enzymes. ? ?Workup in the ED with MRI demonstrating a punctate L cerebellum infarct. Workup also concerning for a potential UTI. ? ?Neurology consulted for further assistance. ? ?LKW: 04/17/22 ?mRS: 4 ?tNKASE: not offered, no focal deficit, outside window. ?Thrombectomy: not offered, no focal deficit, outside window ?NIHSS components Score: Comment  ?1a Level of Conscious 0_0  1_1  2_2  3_3      ?1b LOC Questions 0_4  1_5  2_6       ?1c LOC Commands 0_7  1_8  2_9       ?2 Best Gaze 0_10  1_11  2_12       ?3 Visual 0_13  1_14  2_15  3_16      ?4 Facial Palsy 0_17  1_18  2_19  3_20      ?5a Motor Arm - left 0_21  1_22  2_23  3_24  4_25  UN_26    ?5b Motor Arm - Right 0_27  1_28  2_29  3_30  4_31  UN_32    ?6a Motor Leg - Left 0_33  1_34  2_35  3_36  4_37  UN_38    ?6b Motor Leg - Right 0_39  1_40  2_41  3_42  4_43  UN_44    ?7 Limb Ataxia 0_45  1_46  2_47  3_48  UN_49     ?8 Sensory 0_50  1_51  2_52  UN_53      ?9 Best Language 0_54  1_55  2_56  3_57      ?10 Dysarthria 0_58  1_59  2_60  UN_61      ?11 Extinct. and Inattention 0_62  1_63  2_64       ?TOTAL: 2   ? ?ROS  ? ?Constitutional Denies weight loss, fever and chills.   ?HEENT  Denies changes in vision and hearing.   ?Respiratory Denies SOB and cough.   ?CV Denies palpitations and CP   ?GI Denies abdominal pain, nausea, vomiting and diarrhea.   ?GU Denies dysuria and urinary frequency.   ?MSK Denies myalgia and joint pain.   ?Skin Denies rash and pruritus.   ?Neurological Denies headache and syncope.   ?Psychiatric Denies recent changes in mood. Denies anxiety and depression.   ? ?Past History  ? ?Past Medical History:  ?Diagnosis Date  ? Alzheimer disease ([place])   ? Anemia   ? Arrhythmia   ? Cataract   ? Colon polyps   ? Community acquired pneumonia 01/2012  ? LLL  ? Congestive heart failure ([place])   ? Hyperlipidemia   ? Hypertension   ? Memory difficulty 10/01/2016  ? Osteoporosis   ? Pleural effusion, right 01/2012  ? SVT (supraventricular tachycardia) ([place]) 01/2012  ? UTI (lower urinary tract infection)   ? ?Past Surgical History:  ?Procedure Laterality Date  ? ABDOMINAL HYSTERECTOMY  1983  ? APPENDECTOMY  1983  ? COLONOSCOPY    ? COLONOSCOPY  N/A 06/10/2016  ? Procedure: COLONOSCOPY;  Surgeon: Manus Gunning, MD;  Location: [facility];  Service: Gastroenterology;  Laterality: N/A;  ? ESOPHAGOGASTRODUODENOSCOPY N/A 06/10/2016  ? Procedure: ESOPHAGOGASTRODUODENOSCOPY (EGD);  Surgeon: Manus Gunning, MD;  Location: [facility];  Service: Gastroenterology;  Laterality: N/A;  ? HEMIARTHROPLASTY HIP  2011  ? Left femoral neck fracture  ? RIGHT/LEFT HEART CATH AND CORONARY ANGIOGRAPHY N/A 01/08/2022  ? Procedure: RIGHT/LEFT HEART CATH AND CORONARY ANGIOGRAPHY;  Surgeon: Leonie Man, MD;  Location: [facility];  Service: Cardiovascular;  Laterality: N/A;  ? UPPER GASTROINTESTINAL ENDOSCOPY    ? ?Family History  ?Problem Relation Age of Onset  ? Heart Problems Mother   ? Osteoporosis Mother   ? Stroke Father   ? Heart Problems Father   ? Other Brother 67  ?     COVID  ? Colon polyps Son   ? Parkinson's disease Son   ? Neuropathy Son   ? Heart disease Son   ? Cancer  Grandchild   ? ?Social History  ? ?Socioeconomic History  ? Marital status: Widowed  ?  Spouse name: Not on file  ? Number of children: 2  ? Years of education: 58  ? Highest education level: Not on file  ?Occupational History  ? Occupation: Retired  ?Tobacco Use  ? Smoking status: Never  ? Smokeless tobacco: Never  ?Vaping Use  ? Vaping Use: Never used  ?Substance and Sexual Activity  ? Alcohol use: Never  ? Drug use: Never  ? Sexual activity: Not on file  ?Other Topics Concern  ? Not on file  ?Social History Narrative  ? Diet: low sodium/ fluid  ?   ? Caffeine:Green tea, cheerwine,chocolate  ?   ? Married, if yes what year: [phone]  ?   ? Do you live in a house, apartment, assisted living, condo, trailer, ect: House  ?   ? Is it one or more stories: One  ?   ? How many persons live in your home? 1  ?   ? Pets: No  ?   ? Highest level or education completed: High School  ?   ? Current/Past profession: Secretary/administrator & book keeping  ?   ? Exercise: Yes                 Type and how often: Walking, strength training   ?   ?   ? Living Will: Yes   ? DNR: No  ? POA/HPOA: Yes  ?   ? Functional Status:  ? Do you have difficulty bathing or dressing yourself? No  ? Do you have difficulty preparing food or eating? No  ? Do you have difficulty managing your medications? Yes  ? Do you have difficulty managing your finances? Yes  ? Do you have difficulty affording your medications? Yes  ? ?Social Determinants of Health  ? ?Financial Resource Strain: Not on file  ?Food Insecurity: Not on file  ?Transportation Needs: Not on file  ?Physical Activity: Not on file  ?Stress: Not on file  ?Social Connections: Not on file  ? ?Allergies  ?Allergen Reactions  ? Ciprofloxacin Diarrhea  ? Sulfa Antibiotics Hives and Rash  ?  Severe Rash  ? Memantine Other (See Comments)  ?  dizziness  ? Succinylsulphathiazole Rash  ? Sulfamethoxazole Rash  ? ? ?Medications  ?(Not in a hospital admission) ?  ? ?Vitals  ? ?Vitals:  ? 04/22/22 0147 04/22/22  0245 04/22/22 0330  04/22/22 0345  ?BP: (!) 141/71 (!) 142/69 128/70 125/71  ?Pulse: 92 (!) 107 93 (!) 108  ?Resp: _0 (!) 29  ?Temp:      ?TempSrc:      ?SpO2: 95% 95% 98% 94%  ?  ? ?There is no height or weight on file to calculate BMI. ? ?Physical Exam  ? ?General: Laying comfortably in bed; in no acute distress.  ?HENT: Normal oropharynx and mucosa. Normal external appearance of ears and nose.  ?Neck: Supple, no pain or tenderness  ?CV: No JVD. No peripheral edema.  ?Pulmonary: Symmetric Chest rise. Normal respiratory effort.  ?Abdomen: Soft to touch, non-tender.  ?Ext: No cyanosis, edema, or deformity  ?Skin: No rash. Normal palpation of skin.   ?Musculoskeletal: Normal digits and nails by inspection. No clubbing.  ? ?Neurologic Examination  ?Mental status/Cognition: Alert, oriented to self and place but not to month and year, good attention.  ?Speech/language: Fluent, comprehension intact, object naming intact, repetition intact.  ?Cranial nerves:  ? CN II Pupils equal and reactive to light, no VF deficits   ? CN III,IV,VI EOM intact, no gaze preference or deviation, no nystagmus   ? CN V normal sensation in V1, V2, and V3 segments bilaterally   ? CN VII no asymmetry, no nasolabial fold flattening   ? CN VIII normal hearing to speech   ? CN IX & X normal palatal elevation, no uvular deviation   ? CN XI 5/5 head turn and 5/5 shoulder shrug bilaterally   ? CN XII midline tongue protrusion   ? ?Motor:  ?Muscle bulk: poor, tone mild paratonia ?Mvmt Root Nerve  Muscle Right Left Comments  ?SA C5/6 Ax Deltoid     ?EF C5/6 Mc Biceps 5 5   ?EE C6/7/8 Rad Triceps 5 5   ?WF C6/7 Med FCR     ?WE C7/8 PIN ECU     ?F Ab C8/T1 U ADM/FDI 5 5   ?HF L1/2/3 Fem Illopsoas 4 4+ Pain in RLE  ?KE L2/3/4 Fem Quad 5 5   ?DF L4/5 D Peron Tib Ant 5 5   ?PF S1/2 Tibial Grc/Sol 5 5   ? ?Reflexes: ?Politely declined 2/2 pain. ? ?Sensation: ? Light touch Intact throughout  ? Pin prick   ? Temperature   ? Vibration   ?Proprioception    ? ?Coordination/Complex Motor:  ?- Finger to Nose intact BL ?- Heel to shin intact BL ?- Rapid alternating movement are slowed throughout ?- Gait: Deferred. ? ?Labs  ? ?CBC:  ?Recent Labs  ?Lab 04/16/22

## 2022-04-22 NOTE — Progress Notes (Addendum)
STROKE TEAM PROGRESS NOTE  ? ?INTERVAL HISTORY ?[name] Dyer is a 88 y.o. female with PMH significant for HTN, HLD, osteoporosis, Afib on Xarelto, chronic memory disturbance, who presented to the ED with weakness and confusion. Initial exam with NIHSS of 2 for LOC questions, no focal weakness. UA notable for UTI and MRI showing subacute punctate infarct in the L cerebellum.   ? ?The patient is seen in her room this morning with no family at bedside. She is oriented to location but not time or events. She has difficulty answering relatively simple questions/commands. She reports that her son manages her medications. She is unsure if she has missed doses of Xarelto.  ? ?Vitals:  ? 04/22/22 0330 04/22/22 0345 04/22/22 0948 04/22/22 1000  ?BP: 128/70 125/71 133/73 126/60  ?Pulse: 93 (!) 108 86 87  ?Resp: 19 (!) _0 ?Temp:   97.8 ?F (36.6 ?C)   ?TempSrc:      ?SpO2: 98% 94% 95% 95%  ? ?CBC:  ?Recent Labs  ?Lab 04/16/22 ?1529 04/21/22 ?1746  ?WBC 8.2 9.8  ?NEUTROABS 6,109 8.0*  ?HGB 12.3 12.7  ?HCT 39.2 40.5  ?MCV 93.1 92.0  ?PLT 206 PLATELET CLUMPS NOTED ON SMEAR, UNABLE TO ESTIMATE  ? ?Basic Metabolic Panel:  ?Recent Labs  ?Lab 04/16/22 ?1529 04/21/22 ?1746  ?NA 136 135  ?K 3.0* 3.5  ?CL 90* 91*  ?CO2 31 26  ?GLUCOSE 101* 130*  ?BUN 78* 82*  ?CREATININE 1.92* 2.67*  ?CALCIUM 10.0 9.1  ? ?Lipid Panel:  ?Recent Labs  ?Lab 04/22/22 ?0940  ?CHOL 112  ?TRIG 79  ?HDL 30*  ?CHOLHDL 3.7  ?VLDL 16  ?[place] Walk 66  ? ?HgbA1c:  ?Recent Labs  ?Lab 04/22/22 ?0940  ?HGBA1C 5.9*  ? ?Urine Drug Screen: No results for input(s): LABOPIA, COCAINSCRNUR, LABBENZ, AMPHETMU, THCU, LABBARB in the last 168 hours.  ?Alcohol Level No results for input(s): ETH in the last 168 hours. ? ?IMAGING past 24 hours ?DG Chest 2 View ? ?Result Date: 04/21/2022 ?CLINICAL DATA:  Altered mental status. EXAM: CHEST - 2 VIEW COMPARISON:  Chest x-ray 01/09/2022. FINDINGS: The heart size and mediastinal contours are within normal limits. Both lungs are  clear. The visualized skeletal structures are unremarkable. IMPRESSION: No active cardiopulmonary disease. Electronically Signed   By: Ronney Asters M.D.   On: 04/21/2022 18:32  ? ?CT HEAD WO CONTRAST (5MM) ? ?Result Date: 04/21/2022 ?CLINICAL DATA:  Mental status change, unknown cause. EXAM: CT HEAD WITHOUT CONTRAST TECHNIQUE: Contiguous axial images were obtained from the base of the skull through the vertex without intravenous contrast. RADIATION DOSE REDUCTION: This exam was performed according to the departmental dose-optimization program which includes automated exposure control, adjustment of the mA and/or kV according to patient size and/or use of iterative reconstruction technique. COMPARISON:  04/21/2017. FINDINGS: Brain: No acute intracranial hemorrhage, midline shift or mass effect. No extra-axial fluid collection. Subcortical and periventricular white matter hypodensities are noted bilaterally. No hydrocephalus. Mild diffuse atrophy is noted. There are multifocal infarcts in the cerebellum, occipital lobe on the right, parietal lobe on the left and frontal lobe on the left. Vascular: No hyperdense vessel or unexpected calcification. Skull: Normal. Negative for fracture or focal lesion. Sinuses/Orbits: Air-fluid levels are noted in the maxillary sinuses bilaterally. No acute orbital abnormality. Other: None. IMPRESSION: 1. No acute intracranial hemorrhage. 2. Multifocal infarcts in the cerebrum and cerebellum, some new from 2018 but are indeterminate in age. If there is concern for acute infarct, MRI is  recommended. 3. Atrophy with chronic microvascular ischemic changes. Electronically Signed   By: Brett Fairy M.D.   On: 04/21/2022 21:00  ? ?MR ANGIO HEAD WO CONTRAST ? ?Result Date: 04/22/2022 ?CLINICAL DATA:  Generalized weakness, chronic memory disturbance EXAM: MRA HEAD WITHOUT CONTRAST TECHNIQUE: Angiographic images of the Circle of Willis were acquired using MRA technique without intravenous  contrast. COMPARISON:  Same-day noncontrast brain MRI FINDINGS: Anterior circulation: There is mild irregularity of the intracranial ICAs likely reflecting atherosclerotic disease without high-grade stenosis There is moderate to severe stenosis of the left inferior M2 division proximally (1031-10). There is moderate stenosis at the origin of the superior left M2 division (1031-8). There is moderate stenosis of the right inferior M2 division proximally (1037-8). The bilateral ACAs are patent with mild atherosclerotic irregularity. There is no aneurysm or AVM. Posterior circulation: Bilateral V4 segments are patent. The PICA origins are not included within the field of view. The basilar artery is patent. The superior cerebellar artery origins are patent. There is moderate to severe stenosis of the right P2 segment (1043-2) with distal reconstitution. There is mild atherosclerotic irregularity distally. There is focal moderate to severe stenosis of the left P2 segment (1043-1) with distal reconstitution there is mild atherosclerotic irregularity distally. Posterior communicating arteries are not definitely seen. There is no aneurysm or AVM. Anatomic variants: None. Other: None. IMPRESSION: Intracranial atherosclerotic disease resulting in moderate to severe stenosis of the left inferior M2 division proximally, superior left M2 division at the origin, right inferior M2 division proximally, and bilateral P2 segments. Electronically Signed   By: Valetta Mole M.D.   On: 04/22/2022 08:14  ? ?MR BRAIN WO CONTRAST ? ?Result Date: 04/22/2022 ?CLINICAL DATA:  Generalized weakness with new tremors EXAM: MRI HEAD WITHOUT CONTRAST TECHNIQUE: Multiplanar, multiecho pulse sequences of the brain and surrounding structures were obtained without intravenous contrast. COMPARISON:  Head CT from yesterday FINDINGS: Brain: Weakly restricted diffusion in a tiny left cerebellar infarct. Background of advanced chronic small vessel ischemia  with confluent gliosis. There has been small remote bilateral cerebellar and occipital parietal cortex infarcts. Small remote cortically based infarcts along the left more than right vertex, roughly watershed. No acute hemorrhage, hydrocephalus, or collection. Two adjacent foci of remote microhemorrhage in the inferior left cerebrum, likely from old ischemia. Cerebral volume loss. Vascular: Major flow voids are preserved Skull and upper cervical spine: Normal marrow signal Sinuses/Orbits: Negative IMPRESSION: Punctate subacute infarct in the left cerebellum. Background of advanced chronic small vessel ischemia with scattered remote cortical and cerebellar infarcts, all progressed from 2018. Electronically Signed   By: Jorje Guild M.D.   On: 04/22/2022 05:10  ? ?VAS US CAROTID ? ?Result Date: 04/22/2022 ?Carotid Arterial Duplex Study Patient Name:  [name] Dyer  Date of Exam:   04/22/2022 Medical Rec #: [id number]          Accession #:    [id number] Date of Birth: 1932/06/20          Patient Gender: F Patient Age:   81 years Exam [facility]  [facility] Procedure:      VAS US CAROTID Referring Phys: Alferd Patee [facility] --------------------------------------------------------------------------------  Indications:       CVA. Risk Factors:      Hypertension, hyperlipidemia. Comparison Study:  no prior Performing Technologist: Archie Patten RVS  Examination Guidelines: A complete evaluation includes B-mode imaging, spectral Doppler, color Doppler, and power Doppler as needed of all accessible portions of each vessel. Bilateral testing is considered an integral part  of a complete examination. Limited examinations for reoccurring indications may be performed as noted.  Right Carotid Findings: +----------+--------+--------+--------+------------------+--------+           PSV cm/sEDV cm/sStenosisPlaque DescriptionComments +----------+--------+--------+--------+------------------+--------+ CCA Prox  62       11              heterogenous               +----------+--------+--------+--------+------------------+--------+ CCA Distal38      6               heterogenous               +----------+--------+--------+--------+----------

## 2022-04-22 NOTE — Progress Notes (Signed)
Carotid duplex has been completed.  ? ?Preliminary results in CV Proc.  ? ?[name] Dyer [name] Dyer ?04/22/2022 9:08 AM    ?

## 2022-04-23 DIAGNOSIS — N179 Acute kidney failure, unspecified: Secondary | ICD-10-CM | POA: Diagnosis not present

## 2022-04-23 DIAGNOSIS — I639 Cerebral infarction, unspecified: Secondary | ICD-10-CM | POA: Diagnosis not present

## 2022-04-23 DIAGNOSIS — G309 Alzheimer's disease, unspecified: Secondary | ICD-10-CM | POA: Diagnosis not present

## 2022-04-23 DIAGNOSIS — Z7901 Long term (current) use of anticoagulants: Secondary | ICD-10-CM | POA: Diagnosis not present

## 2022-04-23 DIAGNOSIS — I5032 Chronic diastolic (congestive) heart failure: Secondary | ICD-10-CM | POA: Diagnosis not present

## 2022-04-23 LAB — CBC
HCT: 34.6 % — ABNORMAL LOW (ref 36.0–46.0)
Hemoglobin: 11.3 g/dL — ABNORMAL LOW (ref 12.0–15.0)
MCH: 29 pg (ref 26.0–34.0)
MCHC: 32.7 g/dL (ref 30.0–36.0)
MCV: 88.9 fL (ref 80.0–100.0)
Platelets: UNDETERMINED 10*3/uL (ref 150–400)
RBC: 3.89 MIL/uL (ref 3.87–5.11)
RDW: 19.3 % — ABNORMAL HIGH (ref 11.5–15.5)
WBC: 6.1 10*3/uL (ref 4.0–10.5)
nRBC: 0 % (ref 0.0–0.2)

## 2022-04-23 LAB — UREA NITROGEN, URINE: Urea Nitrogen, Ur: 890 mg/dL

## 2022-04-23 LAB — COMPREHENSIVE METABOLIC PANEL
ALT: 31 U/L (ref 0–44)
AST: 45 U/L — ABNORMAL HIGH (ref 15–41)
Albumin: 2.8 g/dL — ABNORMAL LOW (ref 3.5–5.0)
Alkaline Phosphatase: 114 U/L (ref 38–126)
Anion gap: 9 (ref 5–15)
BUN: 64 mg/dL — ABNORMAL HIGH (ref 8–23)
CO2: 25 mmol/L (ref 22–32)
Calcium: 8.3 mg/dL — ABNORMAL LOW (ref 8.9–10.3)
Chloride: 103 mmol/L (ref 98–111)
Creatinine, Ser: 1.78 mg/dL — ABNORMAL HIGH (ref 0.44–1.00)
GFR, Estimated: 27 mL/min — ABNORMAL LOW (ref >60)
Glucose, Bld: 112 mg/dL — ABNORMAL HIGH (ref 70–99)
Potassium: 2.4 mmol/L — CL (ref 3.5–5.1)
Sodium: 137 mmol/L (ref 135–145)
Total Bilirubin: 1 mg/dL (ref 0.3–1.2)
Total Protein: 6 g/dL — ABNORMAL LOW (ref 6.5–8.1)

## 2022-04-23 LAB — POTASSIUM: Potassium: 3.5 mmol/L (ref 3.5–5.1)

## 2022-04-23 LAB — MAGNESIUM: Magnesium: 2.4 mg/dL (ref 1.7–2.4)

## 2022-04-23 MED ORDER — POTASSIUM CHLORIDE 10 MEQ/100ML IV SOLN
10.0000 meq | INTRAVENOUS | Status: AC
  Administered 2022-04-23 (×2): 10 meq via INTRAVENOUS
  Filled 2022-04-23 (×2): qty 100

## 2022-04-23 MED ORDER — POTASSIUM CHLORIDE CRYS ER 20 MEQ PO TBCR
40.0000 meq | EXTENDED_RELEASE_TABLET | Freq: Once | ORAL | Status: AC
  Administered 2022-04-23: 40 meq via ORAL
  Filled 2022-04-23: qty 2

## 2022-04-23 NOTE — Progress Notes (Signed)
? ?PROGRESS NOTE ? ? ? ?[name] Dyer  PHX:[id number] DOB: [birth year] DOA: 04/21/2022 ?PCP: Ngetich, [name], NP ? ? ?Brief Narrative: ?[name] Dyer is a 88 y.o. female with a history of hypertension, hyperlipidemia, CHF, atrial fibrillation, dementia. Patient presented secondary to progressive weakness. Patient was found to have evidence of a UTI in addition to evidence of a subacute cerebellar CVA. Antibiotics initiated and neurology consulted. ? ? ?Assessment and Plan: ? ?Acute metabolic encephalopathy ?Likely secondary to UTI. Seems to be improving. Unable to verify baseline at this time. ? ?Subacute left cerebellar CVA ?Neurology consulted. MRI significant for punctate lesion in left cerebellum. MRA head significant for intracranial atherosclerotic disease. Carotid dopplers unremarkable for stenosis. Transthoracic Echocardiogram from three months prior for normal EF with no evidence of thrombus or interatrial shunt. LDL of 66. Hemoglobin A1C of 5.9%. Neurology recommendations pending today. ? ?UTI ?Urinalysis suggestive of UTI. Patient started empirically on Ceftriaxone IV. Urine culture preliminary results significant for Klebsiella pneumoniae and streptococcus viridans.  ?-Continue Ceftriaxone IV ?-Follow-up urine culture data ? ?Hypokalemia ?Potassium of 2.4 on BMP this morning. Given potassium supplementation. ?-Repeat potassium; replete if still low ? ?Dementia with behavioral disturbance ?Stable. ? ?AKI on CKD stage IIIb ?Baseline creatinine is about 1.4. Peak creatinine of 2.67 this admission, with downtrend down toward baseline. ? ?Permanent atrial fibrillation ?-Continue atenolol and Xarelto ? ?Primary hypertension ?-Continue atenolol  ? ?Diastolic heart failure ?Euvolemic. Patient is on Lasix as an outpatient, which was held secondary to AKI. ? ?Hyperlipidemia ?-Continue Crestor ? ?Neuropathy ?-Continue gabapentin ? ?Elevated AST/ALP ?Minimally elevated. No symptoms. ? ?Gout ?-Continue  allopurinol ? ?  ?DVT prophylaxis: Xarelto ?Code Status:   Code Status: Full Code ?Family Communication: None at bedside. Called son but no response ?Disposition Plan: Discharge likely home in 24 hours pending transition to oral antibiotics if able ? ? ?Consultants:  ?Neurology ? ?Procedures:  ?None ? ?Antimicrobials: ?Ceftriaxone IV  ? ? ?Subjective: ?Patient reports not feeling too well today. No specific complaints. ? ?Objective: ?BP 123/80 (BP Location: Right Arm)   Pulse 95   Temp 98.5 ?F (36.9 ?C) (Oral)   Resp 20   Ht _0  (1.626 m)   SpO2 94%   BMI 20.92 kg/m?  ? ?Examination: ? ?General exam: Appears calm and comfortable ?Respiratory system: Clear to auscultation. Respiratory effort normal. ?Cardiovascular system: S1 & S2 heard, RRR. No murmurs, rubs, gallops or clicks. ?Gastrointestinal system: Abdomen is nondistended, soft and nontender. No organomegaly or masses felt. Normal bowel sounds heard. ?Central nervous system: Alert and oriented to self and place. No focal neurological deficits. ?Musculoskeletal: No edema. No calf tenderness ?Skin: No cyanosis. No rashes ?Psychiatry: Judgement and insight appear normal. Mood & affect appropriate.  ? ? ?Data Reviewed: I have personally reviewed following labs and imaging studies ? ?CBC ?Lab Results  ?Component Value Date  ? WBC 6.1 04/23/2022  ? RBC 3.89 04/23/2022  ? HGB 11.3 (L) 04/23/2022  ? HCT 34.6 (L) 04/23/2022  ? MCV 88.9 04/23/2022  ? MCH 29.0 04/23/2022  ? PLT PLATELET CLUMPS NOTED ON SMEAR, UNABLE TO ESTIMATE 04/23/2022  ? MCHC 32.7 04/23/2022  ? RDW 19.3 (H) 04/23/2022  ? LYMPHSABS 0.5 (L) 04/21/2022  ? MONOABS 0.7 04/21/2022  ? EOSABS 0.5 04/21/2022  ? BASOSABS 0.1 04/21/2022  ? ? ? ?Last metabolic panel ?Lab Results  ?Component Value Date  ? NA 137 04/23/2022  ? K 2.4 (LL) 04/23/2022  ? CL 103 04/23/2022  ? CO2 25 04/23/2022  ?  BUN 64 (H) 04/23/2022  ? CREATININE 1.78 (H) 04/23/2022  ? GLUCOSE 112 (H) 04/23/2022  ? GFRNONAA 27 (L) 04/23/2022   ? GFRAA 50 (L) 11/08/2020  ? CALCIUM 8.3 (L) 04/23/2022  ? PROT 6.0 (L) 04/23/2022  ? ALBUMIN 2.8 (L) 04/23/2022  ? LABGLOB 2.2 05/17/2021  ? AGRATIO 2.0 05/17/2021  ? BILITOT 1.0 04/23/2022  ? ALKPHOS 114 04/23/2022  ? AST 45 (H) 04/23/2022  ? ALT 31 04/23/2022  ? ANIONGAP 9 04/23/2022  ? ? ?GFR: ?Estimated Creatinine Clearance: 18.9 mL/min (A) (by C-G formula based on SCr of 1.78 mg/dL (H)). ? ?Recent Results (from the past 240 hour(s))  ?Urine Culture     Status: Abnormal (Preliminary result)  ? Collection Time: 04/22/22  3:51 AM  ? Specimen: Urine, Clean Catch  ?Result Value Ref Range Status  ? Specimen Description URINE, CLEAN CATCH  Final  ? Special Requests NONE  Final  ? Culture (A)  Final  ?  >=100,000 COLONIES/mL KLEBSIELLA PNEUMONIAE ?80,000 COLONIES/mL VIRIDANS STREPTOCOCCUS ?Standardized susceptibility testing for this organism is not available. ?Performed at [facility], [address], [place], [place] [address] ?  ? Report Status PENDING  Incomplete  ?  ? ? ?Radiology Studies: ?DG Chest 2 View ? ?Result Date: 04/21/2022 ?CLINICAL DATA:  Altered mental status. EXAM: CHEST - 2 VIEW COMPARISON:  Chest x-ray 01/09/2022. FINDINGS: The heart size and mediastinal contours are within normal limits. Both lungs are clear. The visualized skeletal structures are unremarkable. IMPRESSION: No active cardiopulmonary disease. Electronically Signed   By: Ronney Asters M.D.   On: 04/21/2022 18:32  ? ?CT HEAD WO CONTRAST (5MM) ? ?Result Date: 04/21/2022 ?CLINICAL DATA:  Mental status change, unknown cause. EXAM: CT HEAD WITHOUT CONTRAST TECHNIQUE: Contiguous axial images were obtained from the base of the skull through the vertex without intravenous contrast. RADIATION DOSE REDUCTION: This exam was performed according to the departmental dose-optimization program which includes automated exposure control, adjustment of the mA and/or kV according to patient size and/or use of iterative reconstruction technique.  COMPARISON:  04/21/2017. FINDINGS: Brain: No acute intracranial hemorrhage, midline shift or mass effect. No extra-axial fluid collection. Subcortical and periventricular white matter hypodensities are noted bilaterally. No hydrocephalus. Mild diffuse atrophy is noted. There are multifocal infarcts in the cerebellum, occipital lobe on the right, parietal lobe on the left and frontal lobe on the left. Vascular: No hyperdense vessel or unexpected calcification. Skull: Normal. Negative for fracture or focal lesion. Sinuses/Orbits: Air-fluid levels are noted in the maxillary sinuses bilaterally. No acute orbital abnormality. Other: None. IMPRESSION: 1. No acute intracranial hemorrhage. 2. Multifocal infarcts in the cerebrum and cerebellum, some new from 2018 but are indeterminate in age. If there is concern for acute infarct, MRI is recommended. 3. Atrophy with chronic microvascular ischemic changes. Electronically Signed   By: Brett Fairy M.D.   On: 04/21/2022 21:00  ? ?MR ANGIO HEAD WO CONTRAST ? ?Result Date: 04/22/2022 ?CLINICAL DATA:  Generalized weakness, chronic memory disturbance EXAM: MRA HEAD WITHOUT CONTRAST TECHNIQUE: Angiographic images of the Circle of Willis were acquired using MRA technique without intravenous contrast. COMPARISON:  Same-day noncontrast brain MRI FINDINGS: Anterior circulation: There is mild irregularity of the intracranial ICAs likely reflecting atherosclerotic disease without high-grade stenosis There is moderate to severe stenosis of the left inferior M2 division proximally (1031-10). There is moderate stenosis at the origin of the superior left M2 division (1031-8). There is moderate stenosis of the right inferior M2 division proximally (1037-8). The  bilateral ACAs are patent with mild atherosclerotic irregularity. There is no aneurysm or AVM. Posterior circulation: Bilateral V4 segments are patent. The PICA origins are not included within the field of view. The basilar artery is  patent. The superior cerebellar artery origins are patent. There is moderate to severe stenosis of the right P2 segment (1043-2) with distal reconstitution. There is mild atherosclerotic irregularity distally. There is

## 2022-04-23 NOTE — Progress Notes (Addendum)
TRH night cross cover note: ? ?I was notified by RN that the patient's serum potassium level this morning is 2.4.  This represents interval decline from a value of 3.5 at the time of her admission.  This morning's labs also reflect interval improvement in serum creatinine, now 1.78, relative to most recent prior value of 2.13, which was preceded by initial presenting creatinine level of 2.67.  ? ?Per my chart review, including review of most recent hospitalist documentation, this is a 88 year old female who is admitted with subacute CVA as well as AKI on CKD 3B/4.  She also has a history of permanent atrial fibrillation as well as chronic diastolic heart failure, without clinical evidence of acute volume overload at this time. ? ?Given no reported evidence for acutely decompensated heart failure as well as the presence of improving renal function, I have placed orders for potassium supplementation in the form of the following: Potassium chloride 40 meq p.o. x1 dose as well as potassium chloride 20 meq IV over 2 hours x1.  Additionally, I have placed order for add-on serum magnesium level. Order for ongoing telemetry monitoring is confirmed. ? ? ? ? ?Babs Bertin, DO ?Hospitalist ? ?

## 2022-04-23 NOTE — Evaluation (Signed)
Occupational Therapy Evaluation ?Patient Details ?Name: [name] Dyer ?MRN: [id number] ?DOB: [birth year] ?Today's Date: 04/23/2022 ? ? ?History of Present Illness Patient is a 88 y/o female who presents on 4/24 for new UE tremors, gait changes and difficulty directing her own behavior.  Pt has AMS, negative MRI acute changes on vessels in brain and brain itself.  PMHx: CHF, A-fib,HTN, dementia, atherosclerosis, stenosis of brain vessels, L cerebellar subacute punctate stroke,  ? ?Clinical Impression ?  ?PTA pt requiring increasing assist from family for ADLs (bathing, dressing) and used RW for mobility. Pt currently oriented to self, following commands with increased time, unable to attend to visual/cognitive assessment. Mod A-Mod A +2 for bed mobility and stand pivot transfer, increased cuing for hand placement. Discussed d/c plans with son (present in room) son states he feels comfortable providing the level of assist pt needs, interested in pursuing [facility] aide as well, relayed info to case management. Pt presenting with impairments listed below, will follow acutely. Recommend HHOT at d/c along with PCA and 24/7 supervision/assistance. ?   ? ?Recommendations for follow up therapy are one component of a multi-disciplinary discharge planning process, led by the attending physician.  Recommendations may be updated based on patient status, additional functional criteria and insurance authorization.  ? ?Follow Up Recommendations ? Home health OT (family expressing interest in PCA as well)  ?  ?Assistance Recommended at Discharge Frequent or constant Supervision/Assistance  ?Patient can return home with the following A lot of help with walking and/or transfers;A lot of help with bathing/dressing/bathroom;Assistance with cooking/housework;Assistance with feeding;Direct supervision/assist for medications management;Assist for transportation;Direct supervision/assist for financial management;Help with stairs or ramp for  entrance ? ?  ?Functional Status Assessment ? Patient has had a recent decline in their functional status and demonstrates the ability to make significant improvements in function in a reasonable and predictable amount of time.  ?Equipment Recommendations ? None recommended by OT (pt has all needed DME)  ?  ?Recommendations for Other Services   ? ? ?  ?Precautions / Restrictions Precautions ?Precautions: Fall ?Precaution Comments: monitor O2 sats and HR ?Restrictions ?Weight Bearing Restrictions: No  ? ?  ? ?Mobility Bed Mobility ?Overal bed mobility: Needs Assistance ?Bed Mobility: Supine to Sit, Sit to Supine ?  ?  ?Supine to sit: Mod assist ?  ?  ?  ?  ? ?Transfers ?Overall transfer level: Needs assistance ?Equipment used: Rolling walker (2 wheels) ?Transfers: Sit to/from Stand ?Sit to Stand: Mod assist, +2 physical assistance ?  ?  ?  ?  ?  ?General transfer comment: increased cues for hand placement ?  ? ?  ?Balance Overall balance assessment: Needs assistance, History of Falls ?Sitting-balance support: Feet supported ?Sitting balance-Leahy Scale: Fair ?  ?Postural control: Posterior lean, Right lateral lean ?Standing balance support: During functional activity, Bilateral upper extremity supported ?Standing balance-Leahy Scale: Poor ?  ?  ?  ?  ?  ?  ?  ?  ?  ?  ?  ?  ?   ? ?ADL either performed or assessed with clinical judgement  ? ?ADL Overall ADL's : Needs assistance/impaired ?Eating/Feeding: Minimal assistance;Sitting ?  ?Grooming: Minimal assistance;Sitting ?  ?Upper Body Bathing: Moderate assistance;Sitting ?  ?Lower Body Bathing: Moderate assistance;Sitting/lateral leans;Sit to/from stand ?  ?Upper Body Dressing : Moderate assistance;Sitting ?  ?Lower Body Dressing: Moderate assistance;Sit to/from stand;Sitting/lateral leans ?  ?Toilet Transfer: Moderate assistance;Rolling walker (2 wheels) ?  ?Toileting- Clothing Manipulation and Hygiene: Moderate assistance;Sitting/lateral lean;Sit to/from stand ?  ?   ?  ?  Functional mobility during ADLs: Moderate assistance;Rolling walker (2 wheels);+2 for physical assistance ?   ? ? ? ?Vision   ?Vision Assessment?: Vision impaired- to be further tested in functional context ?Additional Comments: does not track visual stimulus  ?   ?Perception   ?  ?Praxis   ?  ? ?Pertinent Vitals/Pain Pain Assessment ?Pain Assessment: No/denies pain  ? ? ? ?Hand Dominance Right ?  ?Extremity/Trunk Assessment Upper Extremity Assessment ?Upper Extremity Assessment: Generalized weakness ?  ?Lower Extremity Assessment ?Lower Extremity Assessment: Defer to PT evaluation ?  ?Cervical / Trunk Assessment ?Cervical / Trunk Assessment: Kyphotic ?  ?Communication Communication ?Communication: No difficulties ?  ?Cognition Arousal/Alertness: Lethargic ?Behavior During Therapy: Flat affect ?Overall Cognitive Status: History of cognitive impairments - at baseline ?  ?  ?  ?  ?  ?  ?  ?  ?  ?  ?  ?  ?  ?  ?  ?  ?General Comments: hx of dementia, oriented to self only ?  ?  ?General Comments  VSS on RA, pt reporting mild dizziness with standing ? ?  ?Exercises   ?  ?Shoulder Instructions    ? ? ?Home Living Family/patient expects to be discharged to:: Private residence ?Living Arrangements: Alone ?Available Help at Discharge: Family;Available 24 hours/day (family alternating to provide 24/7 care per son) ?Type of Home: House ?Home Access: Ramped entrance ?  ?  ?Home Layout: One level ?  ?  ?Bathroom Shower/Tub: Tub/shower unit ?  ?Bathroom Toilet: Handicapped height ?Bathroom Accessibility: Yes ?  ?Home Equipment: Cane - single point;Grab bars - tub/shower;Hand held shower head;Rolling Walker (2 wheels);Rollator (4 wheels);BSC/3in1;Hospital bed ?  ?Additional Comments: sons are present 24/7 to provide around the clock care ?  ? ?  ?Prior Functioning/Environment Prior Level of Function : Needs assist ?  ?  ?  ?Physical Assist : Mobility (physical) ?Mobility (physical): Gait ?  ?Mobility Comments: RW per pt and  chart history ?ADLs Comments: per son report, pt needing assist with bathing ?  ? ?  ?  ?OT Problem List: Decreased strength;Decreased range of motion;Decreased activity tolerance;Impaired balance (sitting and/or standing);Decreased safety awareness;Decreased knowledge of use of DME or AE;Decreased cognition;Impaired vision/perception;Impaired UE functional use ?  ?   ?OT Treatment/Interventions: Self-care/ADL training;Therapeutic exercise;DME and/or AE instruction;Cognitive remediation/compensation;Visual/perceptual remediation/compensation;Patient/family education;Balance training;Neuromuscular education  ?  ?OT Goals(Current goals can be found in the care plan section) Acute Rehab OT Goals ?Patient Stated Goal: none stated ?OT Goal Formulation: Patient unable to participate in goal setting ?Time For Goal Achievement: 05/07/22 ?Potential to Achieve Goals: Fair ?ADL Goals ?Pt Will Perform Eating: sitting;with supervision ?Pt Will Perform Grooming: sitting;standing;with set-up ?Additional ADL Goal #1: Pt will complete bed mobility with min gaurd A in prep for ADLs  ?OT Frequency: Min 3X/week ?  ? ?Co-evaluation   ?  ?  ?  ?  ? ?  ?AM-PAC OT "6 Clicks" Daily Activity     ?Outcome Measure Help from another person eating meals?: A Little ?Help from another person taking care of personal grooming?: A Little ?Help from another person toileting, which includes using toliet, bedpan, or urinal?: A Lot ?Help from another person bathing (including washing, rinsing, drying)?: A Lot ?Help from another person to put on and taking off regular upper body clothing?: A Lot ?Help from another person to put on and taking off regular lower body clothing?: A Lot ?6 Click Score: 14 ?  ?End of Session Equipment Utilized During Treatment: Gait belt;Rolling walker (  2 wheels) ?Nurse Communication: Mobility status ? ?Activity Tolerance: Patient tolerated treatment well ?Patient left: in chair;with call bell/phone within reach;with  family/visitor present (asked nurse secretary to bring green box for chair alarm) ? ?OT Visit Diagnosis: Unsteadiness on feet (R26.81);Other abnormalities of gait and mobility (R26.89);Muscle weakness (generalized) (

## 2022-04-23 NOTE — Progress Notes (Incomplete)
Date and time results received: 04/23/22 0352 ? ? ?Test: Potassium ? ?Critical Value: 2.4 ? ?Name of Provider Notified: J.Howerter ? ?Orders Received? Or Actions Taken?: Orders Received - See Orders for details ?

## 2022-04-23 NOTE — TOC Initial Note (Signed)
Transition of Care (TOC) - Initial/Assessment Note  ? ? ?Patient Details  ?Name: [name] Dyer ?MRN: [id number] ?Date of Birth: 1932/02/07 ? ?Transition of Care [facility]) CM/SW Contact:    ?Geralynn Ochs, LCSW ?Phone Number: ?04/23/2022, 11:46 AM ? ?Clinical Narrative:      CSW spoke with son, [name], on the phone to discuss recommendation for SNF. [name] not interested in SNF, his father had been and he did not get adequate care, they do not want to do that for their mother. Sons have been providing 24/7 for the patient at home, [name] stays during the day as he is retired and [name] takes over for the evenings/nights after he is done with work. Family is asking for CIR consult as one of the ER doctors had mentioned it, but if patient does not qualify then they will take her home with home health. Patient already has wheelchair, walker, shower chair, 3N1, hoyer lift, and a hospital bed available though it is not set up at this time. They also have bars installed in the shower and around the toilets, as well as a ramp into the home. [name] says the patient has had home health in the past but cannot remember the name of the agency. CSW asked CIR Admissions and OT about possible CIR recommendation, and they will review. CSW to follow.           ? ? ?Expected Discharge Plan: [place] ?Barriers to Discharge: Continued Medical Work up ? ? ?Patient Goals and CMS Choice ?Patient states their goals for this hospitalization and ongoing recovery are:: patient unable to participate in goal setting, not oriented ?CMS Medicare.gov Compare Post Acute Care list provided to:: Patient Represenative (must comment) ?Choice offered to / list presented to : Adult Children ? ?Expected Discharge Plan and Services ?Expected Discharge Plan: [place] ?  ?  ?Post Acute Care Choice: IP Rehab ?Living arrangements for the past 2 months: [place] ?                ?  ?  ?  ?  ?  ?  ?  ?  ?  ?   ? ?Prior Living Arrangements/Services ?Living arrangements for the past 2 months: [place] ?Lives with:: Adult Children ?Patient language and need for interpreter reviewed:: No ?Do you feel safe going back to the place where you live?: Yes      ?Need for Family Participation in Patient Care: Yes (Comment) ?Care giver support system in place?: Yes (comment) ?Current home services: DME ?Criminal Activity/Legal Involvement Pertinent to Current Situation/Hospitalization: No - Comment as needed ? ?Activities of Daily Living ?Home Assistive Devices/Equipment: None ?ADL Screening (condition at time of admission) ?[name] cognitive ability adequate to safely complete daily activities?: No ?Is the patient deaf or have difficulty hearing?: No ?Does the patient have difficulty seeing, even when wearing glasses/contacts?: No ?Does the patient have difficulty concentrating, remembering, or making decisions?: Yes ?Patient able to express need for assistance with ADLs?: Yes ?Does the patient have difficulty dressing or bathing?: Yes ?Independently performs ADLs?: No ?Communication: Needs assistance ?Is this a change from baseline?: Change from baseline, expected to last >3 days ?Dressing (OT): Needs assistance ?Is this a change from baseline?: Change from baseline, expected to last >3 days ?Grooming: Needs assistance ?Is this a change from baseline?: Change from baseline, expected to last >3 days ?Feeding: Needs assistance ?Is this a change from baseline?: Change from baseline,  expected to last >3 days ?Bathing: Dependent ?Is this a change from baseline?: Change from baseline, expected to last >3 days ?Toileting: Dependent ?Is this a change from baseline?: Change from baseline, expected to last >3days ?In/Out Bed: Dependent ?Is this a change from baseline?: Change from baseline, expected to last >3 days ?Walks in Home: Needs assistance ?Is this a change from baseline?: Change from baseline, expected to last >3  days ?Does the patient have difficulty walking or climbing stairs?: Yes ?Weakness of Legs: Both ?Weakness of Arms/Hands: Both ? ?Permission Sought/Granted ?Permission sought to share information with : Family Supports ?Permission granted to share information with : Yes, Verbal Permission Granted ? Share Information with NAME: Hubert Azure ?   ? Permission granted to share info w Relationship: Sons ?   ? ?Emotional Assessment ?Appearance:: Appears stated age ?Attitude/Demeanor/Rapport: Unable to Assess ?Affect (typically observed): Unable to Assess ?Orientation: : Oriented to Self, Oriented to Place ?Alcohol / Substance Use: Not Applicable ?Psych Involvement: No (comment) ? ?Admission diagnosis:  Gait disturbance [R26.9] ?CVA (cerebral vascular accident) [facility]) [I63.9] ?Acute cystitis without hematuria [N30.00] ?Acute CVA (cerebrovascular accident) ([place]) [I63.9] ?Cerebellar stroke ([place]) [I63.9] ?Patient Active Problem List  ? Diagnosis Date Noted  ? Acute CVA (cerebrovascular accident) ([place]) 04/22/2022  ? CVA (cerebral vascular accident) ([place]) 04/22/2022  ? Urinary tract infection 04/22/2022  ? Acute kidney injury superimposed on chronic kidney disease ([place]) 04/22/2022  ? Hypertensive heart and kidney disease with chronic diastolic congestive heart failure and stage 4 chronic kidney disease ([place]) 04/20/2022  ? Dyspnea on minimal exertion 01/08/2022  ? Atypical angina ([place]) 01/08/2022  ? Acute on chronic diastolic heart failure ([place]) 01/08/2022  ? Neuropathy 07/17/2021  ? Hand joint pain 12/24/2020  ? Chronic dermatitis 11/24/2020  ? Acute cystitis 05/22/2020  ? Chronic back pain 05/22/2020  ? Cystocele without uterine prolapse 05/22/2020  ? Pain of toe of left foot 04/26/2020  ? Acute on chronic diastolic (congestive) heart failure ([place]) 01/31/2020  ? CHF (congestive heart failure) ([place]) 01/30/2020  ? Chronic rhinitis 11/09/2019  ? Facial pain syndrome 11/09/2019  ? Rash 11/09/2019  ? Temporal arteritis ([place])  11/09/2019  ? Chronic anticoagulation 06/24/2018  ? Peripheral neuropathy 06/24/2018  ? Alzheimer's disease ([place]) 04/08/2018  ? Insomnia 04/08/2018  ? Osteoporosis 04/08/2018  ? Memory difficulty 10/01/2016  ? Colitis presumed infectious 06/06/2016  ? Chronic diastolic CHF (congestive heart failure) ([place]) 06/06/2016  ? Permanent atrial fibrillation ([place]) 06/06/2016  ? Hypertension 06/06/2016  ? Hyperlipidemia 06/06/2016  ? Diarrhea 06/04/2016  ? Generalized abdominal pain 06/04/2016  ? Anemia, unspecified 04/27/2012  ? ?PCP:  Ngetich, [name], NP ?Pharmacy:   ?[facility] [place], [place] ?[address] ?[place] [address] ?Phone: [phone] Fax: [phone] ? ?[id number] [place], [place] [place]?Phone: [phone]: [phone]? ?[place]?[address]?Phone: [phone]: [phone]? ?[facility] ?[address]?Phone: [phone]: [phone] ? ? ? ? ?Social Determinants of Health (SDOH) Interventions ?  ? ?Readmission Risk Interventions ?   ? View : No data to display.  ?  ?  ?  ? ? ? ?

## 2022-04-23 NOTE — Progress Notes (Signed)
STROKE TEAM PROGRESS NOTE  ? ?INTERVAL HISTORY ?Patient is sitting in bed.  She states she is not feeling well.  She is oriented to location but not time or events. She has difficulty answering relatively simple questions/commands.  She has been restarted on Xarelto.  Neurological exam is unchanged.  Vital signs are stable. ? ?Vitals:  ? 04/22/22 2010 04/23/22 0409 04/23/22 0731 04/23/22 1201  ?BP: 122/76 (!) 96/50 123/74 123/80  ?Pulse: (!) 56 84 86 95  ?Resp: 19 20 (!) 22 20  ?Temp: 98.5 ?F (36.9 ?C) 98.6 ?F (37 ?C) 98.3 ?F (36.8 ?C) 98.5 ?F (36.9 ?C)  ?TempSrc: Oral Oral Oral Oral  ?SpO2: 95% 94% 95% 94%  ?Height:      ? ?CBC:  ?Recent Labs  ?Lab 04/16/22 ?1529 04/21/22 ?1746 04/23/22 ?0301  ?WBC 8.2 9.8 6.1  ?NEUTROABS 6,109 8.0*  --   ?HGB 12.3 12.7 11.3*  ?HCT 39.2 40.5 34.6*  ?MCV 93.1 92.0 88.9  ?PLT 206 PLATELET CLUMPS NOTED ON SMEAR, UNABLE TO ESTIMATE PLATELET CLUMPS NOTED ON SMEAR, UNABLE TO ESTIMATE  ? ?Basic Metabolic Panel:  ?Recent Labs  ?Lab 04/21/22 ?1746 04/22/22 ?1545 04/23/22 ?0301  ?NA 135  --  137  ?K 3.5  --  2.4*  ?CL 91*  --  103  ?CO2 26  --  25  ?GLUCOSE 130*  --  112*  ?BUN 82*  --  64*  ?CREATININE 2.67* 2.13* 1.78*  ?CALCIUM 9.1  --  8.3*  ?MG  --   --  2.4  ? ?Lipid Panel:  ?Recent Labs  ?Lab 04/22/22 ?0940  ?CHOL 112  ?TRIG 79  ?HDL 30*  ?CHOLHDL 3.7  ?VLDL 16  ?[place] 66  ? ?HgbA1c:  ?Recent Labs  ?Lab 04/22/22 ?0940  ?HGBA1C 5.9*  ? ?Urine Drug Screen: No results for input(s): LABOPIA, COCAINSCRNUR, LABBENZ, AMPHETMU, THCU, LABBARB in the last 168 hours.  ?Alcohol Level No results for input(s): ETH in the last 168 hours. ? ?IMAGING past 24 hours ?No results found. ? ?PHYSICAL EXAM ? ?Physical Exam  ?Constitutional: Appears well-developed and well-nourished.  Elderly Caucasian lady ?Psych: Affect appropriate to situation ?Eyes: No scleral injection ?HENT: No OP obstrucion ?MSK: no joint deformities.  ?Cardiovascular: Normal rate and regular rhythm.  ?Respiratory: Effort normal,  non-labored breathing ?Skin: WDL ? ?Neuro: ?Mental Status: ?Awake alert oriented to place and person only.  Diminished attention, registration and recall.  Follows simple midline and one-step commands.  No aphasia apraxia or dysarthria. ?Cranial Nerves: ?II: Visual Fields are full. Pupils are equal, round, and reactive to light. ?III,IV, VI: EOMI without ptosis or diploplia.  ?V: patient unable to understand commands ?VII: Facial movement is symmetric resting and smiling ?VIII: Hearing is intact to voice ?X: Palate elevates symmetrically ?XI: Shoulder shrug is symmetric. ?XII: Tongue protrudes midline without atrophy or fasciculations.  ?Motor: ?Tone is normal. Bulk is normal. 5/5 strength was present in all four extremities. ?Sensory: ?Sensation is symmetric to light touch in the arms and legs. ?Cerebellar: ?FNF intact bilaterally ? ? ?ASSESSMENT/PLAN ?[name] Dyer is a 88 y.o. female with PMH significant for HTN, HLD, osteoporosis, Afib on Xarelto, chronic memory disturbance, who presented to the ED with weakness and confusion. Initial exam with NIHSS of 2 for LOC questions, no focal weakness. UA notable for UTI and MRI showing subacute punctate infarct in the L cerebellum.   ? ?Acute encephalopathy with no focal weakness, likely due to underlying UTI and patient with dementia who has been found  to have a small punctate subacute L cerebellar stroke with age indeterminate multifocal infarcts in the context of unknown compliance with Xarelto. Feel her subacute stroke is likely an incidental finding.  ?MRI: punctate subacute infarct in the L cerebellum with scattered remote infarcts ?MRA Head: ICAD, moderate to severe stenosis of the L inferior M2, superior L M2, R inferior M2, and bilateral P2.  ?Carotid Doppler: unremarkable ?2D Echo (01/09/2022): EF 50-55%, no thrombus or interatrial shunt ?LDL 66 ?HgbA1c 5.9 ?VTE prophylaxis - Xarelto restarted ?   ?Diet  ? Diet Heart Room service appropriate? Yes; Fluid  consistency: Thin  ? ?On Xarelto prior to admission, now on Xarelto (rivaroxaban) daily. ?Therapy recommendations: pending ?Disposition:  TBD ? ?Hypertension ?Home meds:  atenolol ?Stable ?Permissive hypertension (OK if < 220/120) but gradually normalize in 5-7 days ?Long-term BP goal normotensive ? ?Hyperlipidemia ?Home meds:  Crestor 5 mg, resumed in hospital ?LDL 66, goal < 70 ?Continue statin at discharge ? ?Glucose control ?Home meds:  NA ?HgbA1c 5.9, goal < 7.0 ?CBGs ?Recent Labs  ?  04/22/22 ?0222  ?GLUCAP 134*  ?  ?SSI ? ?Other Stroke Risk Factors ?Advanced Age >/= 59  ?ETOH use, advised to drink no more than 1 drink(s) a day ? ?Other Active Problems ?UTI being treated with Rocephin, per primary ? ?Hospital day # 1 ? ?Patient was admitted with confusion disorientation likely due to underlying urinary tract infection and she has mild baseline dementia.  MRI scan shows tiny punctate left cerebellar subacute infarct which is likely an incidental finding and unrelated to her presentation.  This is likely from her underlying atrial fibrillation and her compliance with Xarelto is unclear.  Continue ongoing stroke work-up and aggressive risk factor modification.  Treatment of UTI as per primary team.  Continue Xarelto.  Stroke team will sign off.  Kindly call for questions greater than 50% time during this 35-minute visit was spent in counseling and coordination of care and discussion patient and care team answering questions.  Discussed with Dr. Tamala Dyer ? ?[name] Contras, MD ?Medical Director ?[name] ?Pager: [phone] ?04/23/2022 1:06 PM ? ? ?To contact Stroke Continuity provider, please refer to [email or web address] ?After hours, contact General Neurology ? ?

## 2022-04-24 ENCOUNTER — Other Ambulatory Visit: Payer: Self-pay | Admitting: Family

## 2022-04-24 DIAGNOSIS — Z7901 Long term (current) use of anticoagulants: Secondary | ICD-10-CM | POA: Diagnosis not present

## 2022-04-24 DIAGNOSIS — N179 Acute kidney failure, unspecified: Secondary | ICD-10-CM | POA: Diagnosis not present

## 2022-04-24 DIAGNOSIS — G309 Alzheimer's disease, unspecified: Secondary | ICD-10-CM | POA: Diagnosis not present

## 2022-04-24 DIAGNOSIS — I5032 Chronic diastolic (congestive) heart failure: Secondary | ICD-10-CM | POA: Diagnosis not present

## 2022-04-24 LAB — BASIC METABOLIC PANEL
Anion gap: 12 (ref 5–15)
BUN: 42 mg/dL — ABNORMAL HIGH (ref 8–23)
CO2: 23 mmol/L (ref 22–32)
Calcium: 8.6 mg/dL — ABNORMAL LOW (ref 8.9–10.3)
Chloride: 104 mmol/L (ref 98–111)
Creatinine, Ser: 1.4 mg/dL — ABNORMAL HIGH (ref 0.44–1.00)
GFR, Estimated: 36 mL/min — ABNORMAL LOW (ref >60)
Glucose, Bld: 171 mg/dL — ABNORMAL HIGH (ref 70–99)
Potassium: 3.2 mmol/L — ABNORMAL LOW (ref 3.5–5.1)
Sodium: 139 mmol/L (ref 135–145)

## 2022-04-24 LAB — URINE CULTURE: Culture: >=100000 — AB

## 2022-04-24 MED ORDER — POTASSIUM CHLORIDE CRYS ER 20 MEQ PO TBCR
40.0000 meq | EXTENDED_RELEASE_TABLET | ORAL | Status: AC
  Administered 2022-04-24 (×2): 40 meq via ORAL
  Filled 2022-04-24 (×2): qty 2

## 2022-04-24 MED ORDER — SODIUM CHLORIDE 0.9 % IV SOLN
1.0000 g | Freq: Two times a day (BID) | INTRAVENOUS | Status: DC
  Administered 2022-04-24 – 2022-04-26 (×6): 1 g via INTRAVENOUS
  Filled 2022-04-24 (×7): qty 20

## 2022-04-24 NOTE — Progress Notes (Signed)
Pharmacy Antibiotic Note ? ?[name] Dyer is a 88 y.o. female admitted on 04/21/2022 with UTI and subacute cerebellar CVA. Marland Kitchen  Pharmacy has been consulted on 04/24/22 for Merrem dosing ESBL UTI. ? ?WBC wnl, afebrile ?AKI Scr peak 2.67>> 1.78 on 4/26, no SCr on 4/27,  MD will order BMP today. ? ? ?Plan: ?Merrem 1 g IV every 12 hours ?Monitor clinical status, renal function and culture results daily.  ? ? ?Height: _0  (162.6 cm) ?IBW/kg (Calculated) : 54.7 ? ?Temp (24hrs), Avg:98.2 ?F (36.8 ?C), Min:98 ?F (36.7 ?C), Max:98.4 ?F (36.9 ?C) ? ?Recent Labs  ?Lab 04/21/22 ?1746 04/22/22 ?1545 04/23/22 ?0301  ?WBC 9.8  --  6.1  ?CREATININE 2.67* 2.13* 1.78*  ?  ?Estimated Creatinine Clearance: 18.9 mL/min (A) (by C-G formula based on SCr of 1.78 mg/dL (H)).   ? ?Allergies  ?Allergen Reactions  ? Ciprofloxacin Diarrhea  ? Sulfa Antibiotics Hives and Rash  ?  Severe Rash  ? Memantine Other (See Comments)  ?  Dizziness. Pt takes this medication at home  ? Succinylsulphathiazole Rash  ? Sulfamethoxazole Rash  ? ? ?Antimicrobials this admission:  ?CTX  4/25 >>4/27 ?Merrem 4/27>> ? ?Dose adjustments this admission: ? ? ?Microbiology results: ?4/25 UCx: ESBL klebsiella pneumoniae ? ? ? ?Thank you for allowing pharmacy to be a part of this patient?s care. ? ?Nicole Cella, RPh ?Clinical Pharmacist ?[phone] ?04/24/2022 12:13 PM ? Please check AMION for all [place] phone numbers ?After 10:00 PM, call [place] [phone] ? ? ?

## 2022-04-24 NOTE — Progress Notes (Addendum)
? ?PROGRESS NOTE ? ? ? ?[name] Dyer  JQB:[id number] DOB: 07/21/1932 DOA: 04/21/2022 ?PCP: Ngetich, [name], NP ? ? ?Brief Narrative: ?[name] Dyer is a 88 y.o. female with a history of hypertension, hyperlipidemia, CHF, atrial fibrillation, dementia. Patient presented secondary to progressive weakness. Patient was found to have evidence of a UTI in addition to evidence of a subacute cerebellar CVA. Antibiotics initiated and neurology consulted. ? ? ?Assessment and Plan: ? ?Acute metabolic encephalopathy ?Likely secondary to UTI. Seems to be improving. Unable to verify baseline at this time. ? ?Subacute left cerebellar CVA ?Neurology consulted. MRI significant for punctate lesion in left cerebellum. MRA head significant for intracranial atherosclerotic disease. Carotid dopplers unremarkable for stenosis. Transthoracic Echocardiogram from three months prior for normal EF with no evidence of thrombus or interatrial shunt. LDL of 66. Hemoglobin A1C of 5.9%. Neurology recommendations to continue Xarelto with no other medication recommendations. ? ?ESBL Klebsiella UTI ?Urinalysis suggestive of UTI. Patient started empirically on Ceftriaxone IV. Urine culture preliminary results significant for Klebsiella pneumoniae and streptococcus viridans and final culture significant for ESBL Klebsiella pneumoniae. ?-Discontinue Ceftriaxone IV and start meropenem IV ? ?Hypokalemia ?Potassium as low as 2.4 on BMP. Given potassium supplementation with improvement. ?-Repeat BMP today ? ?Dementia with behavioral disturbance ?Stable. ? ?AKI on CKD stage IIIb ?Baseline creatinine is about 1.4. Peak creatinine of 2.67 this admission, with downtrend down toward baseline. ?-Repeat BMP today ? ?Permanent atrial fibrillation ?-Continue atenolol and Xarelto ? ?Primary hypertension ?-Continue atenolol  ? ?Diastolic heart failure ?Euvolemic. Patient is on Lasix as an outpatient, which was held secondary to AKI. ? ?Hyperlipidemia ?-Continue  Crestor ? ?Neuropathy ?-Continue gabapentin ? ?Elevated AST/ALP ?Minimally elevated. No symptoms. ? ?Gout ?-Continue allopurinol ? ?  ?DVT prophylaxis: Xarelto ?Code Status:   Code Status: Full Code ?Family Communication: None at bedside. Called son but no response (4/27) ?Disposition Plan: Discharge pending completion of antibiotic treatment for UTI. Patient requires IV antibiotics. Likely medically stable for discharge in 4 days if she responds well to antibiotics ? ? ?Consultants:  ?Neurology ? ?Procedures:  ?None ? ?Antimicrobials: ?Ceftriaxone IV ?Meropenem IV ? ? ?Subjective: ?Patient reports right forearm pain. No other concerns. ? ?Objective: ?BP 122/61   Pulse 97   Temp 98.2 ?F (36.8 ?C) (Oral)   Resp 18   Ht 5' 4" (1.626 m)   SpO2 92%   BMI 20.92 kg/m?  ? ?Examination: ? ?General exam: Appears calm and comfortable ?Respiratory system: Clear to auscultation. Respiratory effort normal. ?Cardiovascular system: S1 & S2 heard, irregular rhythm. ?Gastrointestinal system: Abdomen is nondistended, soft and nontender. Normal bowel sounds heard. ?Central nervous system: Alert and oriented to person and place. ?Musculoskeletal: No calf tenderness ? ? ?Data Reviewed: I have personally reviewed following labs and imaging studies ? ?CBC ?Lab Results  ?Component Value Date  ? WBC 6.1 04/23/2022  ? RBC 3.89 04/23/2022  ? HGB 11.3 (L) 04/23/2022  ? HCT 34.6 (L) 04/23/2022  ? MCV 88.9 04/23/2022  ? MCH 29.0 04/23/2022  ? PLT PLATELET CLUMPS NOTED ON SMEAR, UNABLE TO ESTIMATE 04/23/2022  ? MCHC 32.7 04/23/2022  ? RDW 19.3 (H) 04/23/2022  ? LYMPHSABS 0.5 (L) 04/21/2022  ? MONOABS 0.7 04/21/2022  ? EOSABS 0.5 04/21/2022  ? BASOSABS 0.1 04/21/2022  ? ? ? ?Last metabolic panel ?Lab Results  ?Component Value Date  ? NA 137 04/23/2022  ? K 3.5 04/23/2022  ? CL 103 04/23/2022  ? CO2 25 04/23/2022  ? BUN 64 (H) 04/23/2022  ?  CREATININE 1.78 (H) 04/23/2022  ? GLUCOSE 112 (H) 04/23/2022  ? GFRNONAA 27 (L) 04/23/2022  ? GFRAA 50  (L) 11/08/2020  ? CALCIUM 8.3 (L) 04/23/2022  ? PROT 6.0 (L) 04/23/2022  ? ALBUMIN 2.8 (L) 04/23/2022  ? LABGLOB 2.2 05/17/2021  ? AGRATIO 2.0 05/17/2021  ? BILITOT 1.0 04/23/2022  ? ALKPHOS 114 04/23/2022  ? AST 45 (H) 04/23/2022  ? ALT 31 04/23/2022  ? ANIONGAP 9 04/23/2022  ? ? ?GFR: ?Estimated Creatinine Clearance: 18.9 mL/min (A) (by C-G formula based on SCr of 1.78 mg/dL (H)). ? ?Recent Results (from the past 240 hour(s))  ?Urine Culture     Status: Abnormal  ? Collection Time: 04/22/22  3:51 AM  ? Specimen: Urine, Clean Catch  ?Result Value Ref Range Status  ? Specimen Description URINE, CLEAN CATCH  Final  ? Special Requests NONE  Final  ? Culture (A)  Final  ?  >=100,000 COLONIES/mL KLEBSIELLA PNEUMONIAE ?Confirmed Extended Spectrum Beta-Lactamase Producer (ESBL).  In bloodstream infections from ESBL organisms, carbapenems are preferred over piperacillin/tazobactam. They are shown to have a lower risk of mortality. ?80,000 COLONIES/mL VIRIDANS STREPTOCOCCUS ?Standardized susceptibility testing for this organism is not available. ?Performed at [name], [place] [address], [place], [place] [address] ?  ? Report Status 04/24/2022 FINAL  Final  ? Organism ID, Bacteria KLEBSIELLA PNEUMONIAE (A)  Final  ?    Susceptibility  ? Klebsiella pneumoniae - MIC*  ?  AMPICILLIN >=32 RESISTANT Resistant   ?  CEFAZOLIN >=64 RESISTANT Resistant   ?  CEFEPIME 8 INTERMEDIATE Intermediate   ?  CEFTRIAXONE >=64 RESISTANT Resistant   ?  CIPROFLOXACIN >=4 RESISTANT Resistant   ?  GENTAMICIN <=1 SENSITIVE Sensitive   ?  IMIPENEM <=0.25 SENSITIVE Sensitive   ?  NITROFURANTOIN 64 INTERMEDIATE Intermediate   ?  TRIMETH/SULFA >=320 RESISTANT Resistant   ?  AMPICILLIN/SULBACTAM >=32 RESISTANT Resistant   ?  PIP/TAZO 32 INTERMEDIATE Intermediate   ?  * >=100,000 COLONIES/mL KLEBSIELLA PNEUMONIAE  ?  ? ? ?Radiology Studies: ?No results found. ? ? ? LOS: 2 days  ? ? ?Cordelia Poche, MD ?[facility] ?04/24/2022, 12:19 PM ? ? ?If  7PM-7AM, please contact night-coverage ?[email or web address] ? ?

## 2022-04-25 ENCOUNTER — Inpatient Hospital Stay (HOSPITAL_COMMUNITY): Payer: Medicare Other

## 2022-04-25 DIAGNOSIS — G309 Alzheimer's disease, unspecified: Secondary | ICD-10-CM | POA: Diagnosis not present

## 2022-04-25 DIAGNOSIS — Z7901 Long term (current) use of anticoagulants: Secondary | ICD-10-CM | POA: Diagnosis not present

## 2022-04-25 DIAGNOSIS — I5032 Chronic diastolic (congestive) heart failure: Secondary | ICD-10-CM | POA: Diagnosis not present

## 2022-04-25 DIAGNOSIS — N179 Acute kidney failure, unspecified: Secondary | ICD-10-CM | POA: Diagnosis not present

## 2022-04-25 LAB — BASIC METABOLIC PANEL
Anion gap: 7 (ref 5–15)
Anion gap: 8 (ref 5–15)
BUN: 38 mg/dL — ABNORMAL HIGH (ref 8–23)
BUN: 39 mg/dL — ABNORMAL HIGH (ref 8–23)
CO2: 23 mmol/L (ref 22–32)
CO2: 21 mmol/L — ABNORMAL LOW (ref 22–32)
Calcium: 8.6 mg/dL — ABNORMAL LOW (ref 8.9–10.3)
Calcium: 8.4 mg/dL — ABNORMAL LOW (ref 8.9–10.3)
Chloride: 105 mmol/L (ref 98–111)
Chloride: 104 mmol/L (ref 98–111)
Creatinine, Ser: 1.47 mg/dL — ABNORMAL HIGH (ref 0.44–1.00)
Creatinine, Ser: 1.54 mg/dL — ABNORMAL HIGH (ref 0.44–1.00)
GFR, Estimated: 34 mL/min — ABNORMAL LOW (ref >60)
GFR, Estimated: 32 mL/min — ABNORMAL LOW (ref >60)
Glucose, Bld: 154 mg/dL — ABNORMAL HIGH (ref 70–99)
Glucose, Bld: 139 mg/dL — ABNORMAL HIGH (ref 70–99)
Potassium: 4.4 mmol/L (ref 3.5–5.1)
Potassium: 4.6 mmol/L (ref 3.5–5.1)
Sodium: 135 mmol/L (ref 135–145)
Sodium: 133 mmol/L — ABNORMAL LOW (ref 135–145)

## 2022-04-25 LAB — MAGNESIUM: Magnesium: 2.3 mg/dL (ref 1.7–2.4)

## 2022-04-25 LAB — BRAIN NATRIURETIC PEPTIDE: B Natriuretic Peptide: 1309.1 pg/mL — ABNORMAL HIGH (ref 0.0–100.0)

## 2022-04-25 MED ORDER — FUROSEMIDE 10 MG/ML IJ SOLN
40.0000 mg | Freq: Once | INTRAMUSCULAR | Status: AC
  Administered 2022-04-25: 40 mg via INTRAVENOUS
  Filled 2022-04-25: qty 4

## 2022-04-25 NOTE — Progress Notes (Addendum)
Physical Therapy Treatment ?Patient Details ?Name: [name] Dyer ?MRN: [id number] ?DOB: 1932/11/26 ?Today's Date: 04/25/2022 ? ? ?History of Present Illness Patient is a 88 y/o female who presents on 4/24 for new UE tremors, gait changes and difficulty directing her own behavior.  Pt has AMS, negative MRI acute changes on vessels in brain and brain itself.  PMHx: CHF, A-fib,HTN, dementia, atherosclerosis, stenosis of brain vessels, L cerebellar subacute punctate stroke, ? ?  ?PT Comments  ? ? Pt received in recliner. Flat affect and mildly lethargic but agreeable to participation in therapy. Pt with c/o generalized pain with all mobility attempts, primarily concentrated in RUE. She required max assist sit to stand with RW x 2 trials. Pt in a fib with HR increase to 123 during second standing trial. Pt repositioned in recliner and performed LE exercises with feet elevated. HR 90 at end of session. Pt remained in recliner. Fatigue noted. Per chart, pt's family prefers home with home health at discharge. Family able to provide 24-hour assist, and they have all needed DME. D/C recs updated. ? ?   ?Recommendations for follow up therapy are one component of a multi-disciplinary discharge planning process, led by the attending physician.  Recommendations may be updated based on patient status, additional functional criteria and insurance authorization. ? ?Follow Up Recommendations ? Home health PT (home aide) ?  ?  ?Assistance Recommended at Discharge Frequent or constant Supervision/Assistance  ?Patient can return home with the following A lot of help with walking and/or transfers;A lot of help with bathing/dressing/bathroom;Assistance with cooking/housework;Direct supervision/assist for medications management;Direct supervision/assist for financial management;Assist for transportation;Help with stairs or ramp for entrance ?  ?Equipment Recommendations ? None recommended by PT  ?  ?Recommendations for Other Services    ? ? ?  ?Precautions / Restrictions Precautions ?Precautions: Fall;Other (comment) ?Precaution Comments: monitor HR ?Restrictions ?Weight Bearing Restrictions: No  ?  ? ?Mobility ? Bed Mobility ?  ?  ?  ?  ?  ?  ?  ?General bed mobility comments: Pt received in recliner and remained in recliner at end of session. ?  ? ?Transfers ?Overall transfer level: Needs assistance ?Equipment used: Rolling walker (2 wheels) ?Transfers: Sit to/from Stand ?Sit to Stand: Max assist ?  ?  ?  ?  ?  ?General transfer comment: cues for hand placement, increased time, sit to stand x 2 trials from recliner. Pt in a fib. HR increased into 120s during second standing trial. ?  ? ?Ambulation/Gait ?  ?  ?  ?  ?  ?  ?  ?General Gait Details: unable ? ? ?Stairs ?  ?  ?  ?  ?  ? ? ?Wheelchair Mobility ?  ? ?Modified Rankin (Stroke Patients Only) ?Modified Rankin (Stroke Patients Only) ?Pre-Morbid Rankin Score: Moderately severe disability ?Modified Rankin: Severe disability ? ? ?  ?Balance Overall balance assessment: Needs assistance, History of Falls ?Sitting-balance support: Feet supported ?Sitting balance-Leahy Scale: Fair ?  ?  ?Standing balance support: During functional activity, Bilateral upper extremity supported, Reliant on assistive device for balance ?Standing balance-Leahy Scale: Poor ?  ?  ?  ?  ?  ?  ?  ?  ?  ?  ?  ?  ?  ? ?  ?Cognition Arousal/Alertness: Awake/alert ?Behavior During Therapy: Flat affect ?Overall Cognitive Status: History of cognitive impairments - at baseline ?  ?  ?  ?  ?  ?  ?  ?  ?  ?  ?  ?  ?  ?  ?  ?  ?  General Comments: hx of dementia, oriented to self only ?  ?  ? ?  ?Exercises General Exercises - Lower Extremity ?Ankle Circles/Pumps: AROM, Both, 10 reps ?Heel Slides: AAROM, Right, Left, 10 reps ?Hip ABduction/ADduction: AAROM, Right, Left, 10 reps ? ?  ?General Comments General comments (skin integrity, edema, etc.): Pt in a fib. Resting HR 88. Increased to 123 during second standing trial. Remained in  100s after 2-3 minute seated rest, so no further standing trials. Repositioned in recliner and performed LE exercises. HR 90 at end of session. ?  ?  ? ?Pertinent Vitals/Pain Pain Assessment ?Pain Assessment: Faces ?Faces Pain Scale: Hurts little more ?Pain Location: generalized with moiblity ?Pain Descriptors / Indicators: Tender, Discomfort, Grimacing, Guarding ?Pain Intervention(s): Limited activity within patient's tolerance, Monitored during session, Repositioned  ? ? ?Home Living   ?  ?  ?  ?  ?  ?  ?  ?  ?  ?   ?  ?Prior Function    ?  ?  ?   ? ?PT Goals (current goals can now be found in the care plan section) Acute Rehab PT Goals ?Patient Stated Goal: not stated ?Progress towards PT goals: Progressing toward goals ? ?  ?Frequency ? ? ? Min 3X/week ? ? ? ?  ?PT Plan Discharge plan needs to be updated;Frequency needs to be updated  ? ? ?Co-evaluation   ?  ?  ?  ?  ? ?  ?AM-PAC PT "6 Clicks" Mobility   ?Outcome Measure ? Help needed turning from your back to your side while in a flat bed without using bedrails?: A Lot ?Help needed moving from lying on your back to sitting on the side of a flat bed without using bedrails?: A Lot ?Help needed moving to and from a bed to a chair (including a wheelchair)?: A Lot ?Help needed standing up from a chair using your arms (e.g., wheelchair or bedside chair)?: A Lot ?Help needed to walk in hospital room?: Total ?Help needed climbing 3-5 steps with a railing? : Total ?6 Click Score: 10 ? ?  ?End of Session Equipment Utilized During Treatment: Gait belt ?Activity Tolerance: Patient limited by pain;Patient limited by fatigue;Treatment limited secondary to medical complications (Comment) (a fib, tachy) ?Patient left: in chair;with call bell/phone within reach;with chair alarm set ?Nurse Communication: Mobility status ?PT Visit Diagnosis: Unsteadiness on feet (R26.81);Muscle weakness (generalized) (M62.81);Difficulty in walking, not elsewhere classified (R26.2);Other  (comment) ?  ? ? ?Time: 5790-3833 ?PT Time Calculation (min) (ACUTE ONLY): 17 min ? ?Charges:  $Therapeutic Activity: 8-22 mins          ?          ? ?Lorrin Goodell, PT  ?Office # [phone] ?Pager [phone] ? ? ? ?Lorriane Shire ?04/25/2022, 11:29 AM ? ?

## 2022-04-25 NOTE — Progress Notes (Signed)
?  X-cover Note: ?Bedside RN reporting pt with wheezing, tachypnea. Home dose of lasix has been on hold since admission due to AKI/CKD. ? ?Will check CXR, BNP. Then decide on whether dose of IV lasix would be appropriate.  Pt on RA. ? ? ?Kristopher Oppenheim, DO ?[facility] ? ?

## 2022-04-25 NOTE — Progress Notes (Signed)
Occupational Therapy Treatment ?Patient Details ?Name: [name] Dyer ?MRN: [id number] ?DOB: 1932-06-07 ?Today's Date: 04/25/2022 ? ? ?History of present illness Patient is a 88 y/o female who presents on 4/24 for new UE tremors, gait changes and difficulty directing her own behavior.  Pt has AMS, negative MRI acute changes on vessels in brain and brain itself.  PMHx: CHF, A-fib,HTN, dementia, atherosclerosis, stenosis of brain vessels, L cerebellar subacute punctate stroke, ?  ?OT comments ? Patient received in bed and agreeable to OT session. Patient required mod assist to get to EOB due to assistance with BLE. Face to face transfer performed to recliner for safety with mod assist and patient demonstrating posterior leaning. Patient was setup for grooming and self feeding with verbal cues for sequencing and to stay on tasks. Patient was able to manage utensils and cups with occasional min assist to progress tasks. Acute OT to continue to follow.   ? ?Recommendations for follow up therapy are one component of a multi-disciplinary discharge planning process, led by the attending physician.  Recommendations may be updated based on patient status, additional functional criteria and insurance authorization. ?   ?Follow Up Recommendations ? Home health OT (family expressing interest in PCA as well)  ?  ?Assistance Recommended at Discharge Frequent or constant Supervision/Assistance  ?Patient can return home with the following ? A lot of help with walking and/or transfers;A lot of help with bathing/dressing/bathroom;Assistance with cooking/housework;Assistance with feeding;Direct supervision/assist for medications management;Assist for transportation;Direct supervision/assist for financial management;Help with stairs or ramp for entrance ?  ?Equipment Recommendations ? None recommended by OT (patient has all needed equipment)  ?  ?Recommendations for Other Services   ? ?  ?Precautions / Restrictions  Precautions ?Precautions: Fall ?Precaution Comments: monitor O2 sats and HR  ? ? ?  ? ?Mobility Bed Mobility ?Overal bed mobility: Needs Assistance ?Bed Mobility: Supine to Sit ?  ?  ?Supine to sit: Mod assist ?  ?  ?General bed mobility comments: assistance with getting BLEs to EOB and scooting hips forward with bed pad as assist ?  ? ?Transfers ?Overall transfer level: Needs assistance ?Equipment used: None ?Transfers: Sit to/from Stand, Bed to chair/wheelchair/BSC ?Sit to Stand: Mod assist ?Stand pivot transfers: Mod assist ?  ?  ?  ?  ?General transfer comment: face to face technique for safety with stand pivot transfer ?  ?  ?Balance Overall balance assessment: Needs assistance, History of Falls ?Sitting-balance support: Feet supported ?Sitting balance-Leahy Scale: Fair ?  ?Postural control: Posterior lean, Right lateral lean ?Standing balance support: During functional activity, Bilateral upper extremity supported ?Standing balance-Leahy Scale: Poor ?Standing balance comment: posterior leaning during stand pivot transfer ?  ?  ?  ?  ?  ?  ?  ?  ?  ?  ?  ?   ? ?ADL either performed or assessed with clinical judgement  ? ?ADL Overall ADL's : Needs assistance/impaired ?Eating/Feeding: Sitting;Supervision/ safety ?Eating/Feeding Details (indicate cue type and reason): verbal cues to stay on tasks, does best with finger foods; apple slices ?Grooming: Supervision/safety;Cueing for sequencing;Sitting;Wash/dry hands;Wash/dry face ?Grooming Details (indicate cue type and reason): seated in recliner ?  ?  ?  ?  ?  ?  ?  ?  ?  ?  ?  ?  ?  ?  ?  ?General ADL Comments: performed self feeding and grooming seated in recliner ?  ? ?Extremity/Trunk Assessment   ?  ?  ?  ?  ?  ? ?Vision   ?  ?  ?  Perception   ?  ?Praxis   ?  ? ?Cognition Arousal/Alertness: Awake/alert ?Behavior During Therapy: Flat affect ?Overall Cognitive Status: History of cognitive impairments - at baseline ?  ?  ?  ?  ?  ?  ?  ?  ?  ?  ?  ?  ?  ?  ?  ?   ?General Comments: hx of dementia, oriented to self only ?  ?  ?   ?Exercises   ? ?  ?Shoulder Instructions   ? ? ?  ?General Comments    ? ? ?Pertinent Vitals/ Pain       Pain Assessment ?Pain Assessment: No/denies pain ? ?Home Living   ?  ?  ?  ?  ?  ?  ?  ?  ?  ?  ?  ?  ?  ?  ?  ?  ?  ?  ? ?  ?Prior Functioning/Environment    ?  ?  ?  ?   ? ?Frequency ? Min 3X/week  ? ? ? ? ?  ?Progress Toward Goals ? ?OT Goals(current goals can now be found in the care plan section) ? Progress towards OT goals: Progressing toward goals ? ?Acute Rehab OT Goals ?OT Goal Formulation: Patient unable to participate in goal setting ?Time For Goal Achievement: 05/07/22 ?Potential to Achieve Goals: Fair ?ADL Goals ?Pt Will Perform Eating: sitting;with supervision ?Pt Will Perform Grooming: sitting;standing;with set-up ?Additional ADL Goal #1: Pt will complete bed mobility with min gaurd A in prep for ADLs  ?Plan Discharge plan remains appropriate   ? ?Co-evaluation ? ? ?   ?  ?  ?  ?  ? ?  ?AM-PAC OT "6 Clicks" Daily Activity     ?Outcome Measure ? ? Help from another person eating meals?: A Little ?Help from another person taking care of personal grooming?: A Little ?Help from another person toileting, which includes using toliet, bedpan, or urinal?: A Lot ?Help from another person bathing (including washing, rinsing, drying)?: A Lot ?Help from another person to put on and taking off regular upper body clothing?: A Lot ?Help from another person to put on and taking off regular lower body clothing?: A Lot ?6 Click Score: 14 ? ?  ?End of Session Equipment Utilized During Treatment: Gait belt ? ?OT Visit Diagnosis: Unsteadiness on feet (R26.81);Other abnormalities of gait and mobility (R26.89);Muscle weakness (generalized) (M62.81);Other symptoms and signs involving cognitive function ?  ?Activity Tolerance Patient tolerated treatment well ?  ?Patient Left in chair;with call bell/phone within reach;with chair alarm set;with  nursing/sitter in room ?  ?Nurse Communication Mobility status ?  ? ?   ? ?Time: 1884-1660 ?OT Time Calculation (min): 26 min ? ?Charges: OT General Charges ?$OT Visit: 1 Visit ?OT Treatments ?$Self Care/Home Management : 23-37 mins ? ?Lodema Hong, OTA ?Acute Rehabilitation Services  ?Pager [phone] ?Office [phone] ? ? ?[place] ?04/25/2022, 9:48 AM ?

## 2022-04-25 NOTE — Progress Notes (Signed)
? ?PROGRESS NOTE ? ? ? ?[name] Dyer  IDP:[id number] DOB: 1932/08/08 DOA: 04/21/2022 ?PCP: Ngetich, [name], NP ? ? ?Brief Narrative: ?[name] Dyer is a 88 y.o. female with a history of hypertension, hyperlipidemia, CHF, atrial fibrillation, dementia. Patient presented secondary to progressive weakness. Patient was found to have evidence of a UTI in addition to evidence of a subacute cerebellar CVA. Antibiotics initiated and neurology consulted. ? ? ?Assessment and Plan: ? ?Acute metabolic encephalopathy ?Likely secondary to UTI. Seems to be improving. Unable to verify baseline at this time. ? ?Subacute left cerebellar CVA ?Neurology consulted. MRI significant for punctate lesion in left cerebellum. MRA head significant for intracranial atherosclerotic disease. Carotid dopplers unremarkable for stenosis. Transthoracic Echocardiogram from three months prior for normal EF with no evidence of thrombus or interatrial shunt. LDL of 66. Hemoglobin A1C of 5.9%. Neurology recommendations to continue Xarelto with no other medication recommendations. ? ?ESBL Klebsiella UTI ?Urinalysis suggestive of UTI. Patient started empirically on Ceftriaxone IV. Urine culture preliminary results significant for Klebsiella pneumoniae and streptococcus viridans and final culture significant for ESBL Klebsiella pneumoniae. ?- meropenem IV x3 days if good response ? ?Hypokalemia ?Potassium as low as 2.4 on BMP. Given potassium supplementation with improvement. Potassium back down to 3.2 ?-Repeat potassium check today ? ?Dementia with behavioral disturbance ?Stable. ? ?AKI on CKD stage IIIb ?Baseline creatinine is about 1.4. Peak creatinine of 2.67 this admission, with downtrend down toward baseline. ?-Repeat BMP today ? ?Permanent atrial fibrillation ?-Continue atenolol and Xarelto ? ?Primary hypertension ?-Continue atenolol  ? ?Diastolic heart failure ?Euvolemic. Patient is on Lasix as an outpatient, which was held secondary to  AKI. ? ?Hyperlipidemia ?-Continue Crestor ? ?Neuropathy ?-Continue gabapentin ? ?Elevated AST/ALP ?Minimally elevated. No symptoms. ? ?Gout ?-Continue allopurinol ? ?  ?DVT prophylaxis: Xarelto ?Code Status:   Code Status: Full Code ?Family Communication: None at bedside. Called son but no response (4/28) ?Disposition Plan: Discharge pending completion of antibiotic treatment for UTI. Patient requires IV antibiotics. Likely medically stable for discharge in 2-4 days depending on her response to antibiotics ? ? ?Consultants:  ?Neurology ? ?Procedures:  ?None ? ?Antimicrobials: ?Ceftriaxone IV ?Meropenem IV ? ? ?Subjective: ?Feels so-so today. Patient reports some slight shortness of breath. ? ?Objective: ?BP 111/81   Pulse 81   Temp 97.6 ?F (36.4 ?C) (Oral)   Resp 18   Ht _0  (1.626 m)   SpO2 99%   BMI 20.92 kg/m?  ? ?Examination: ? ?General exam: Appears calm and comfortable ?Respiratory system: Clear to auscultation. Respiratory effort normal. ?Cardiovascular system: S1 & S2 heard, irregular rhythm with normal rate ?Gastrointestinal system: Abdomen is nondistended, soft and nontender. Normal bowel sounds heard. ?Central nervous system: Alert and oriented to person and place. ? ? ?Data Reviewed: I have personally reviewed following labs and imaging studies ? ?CBC ?Lab Results  ?Component Value Date  ? WBC 6.1 04/23/2022  ? RBC 3.89 04/23/2022  ? HGB 11.3 (L) 04/23/2022  ? HCT 34.6 (L) 04/23/2022  ? MCV 88.9 04/23/2022  ? MCH 29.0 04/23/2022  ? PLT PLATELET CLUMPS NOTED ON SMEAR, UNABLE TO ESTIMATE 04/23/2022  ? MCHC 32.7 04/23/2022  ? RDW 19.3 (H) 04/23/2022  ? LYMPHSABS 0.5 (L) 04/21/2022  ? MONOABS 0.7 04/21/2022  ? EOSABS 0.5 04/21/2022  ? BASOSABS 0.1 04/21/2022  ? ? ? ?Last metabolic panel ?Lab Results  ?Component Value Date  ? NA 139 04/24/2022  ? K 3.2 (L) 04/24/2022  ? CL 104 04/24/2022  ? CO2  23 04/24/2022  ? BUN 42 (H) 04/24/2022  ? CREATININE 1.40 (H) 04/24/2022  ? GLUCOSE 171 (H) 04/24/2022  ?  GFRNONAA 36 (L) 04/24/2022  ? GFRAA 50 (L) 11/08/2020  ? CALCIUM 8.6 (L) 04/24/2022  ? PROT 6.0 (L) 04/23/2022  ? ALBUMIN 2.8 (L) 04/23/2022  ? LABGLOB 2.2 05/17/2021  ? AGRATIO 2.0 05/17/2021  ? BILITOT 1.0 04/23/2022  ? ALKPHOS 114 04/23/2022  ? AST 45 (H) 04/23/2022  ? ALT 31 04/23/2022  ? ANIONGAP 12 04/24/2022  ? ? ?GFR: ?Estimated Creatinine Clearance: 24 mL/min (A) (by C-G formula based on SCr of 1.4 mg/dL (H)). ? ?Recent Results (from the past 240 hour(s))  ?Urine Culture     Status: Abnormal  ? Collection Time: 04/22/22  3:51 AM  ? Specimen: Urine, Clean Catch  ?Result Value Ref Range Status  ? Specimen Description URINE, CLEAN CATCH  Final  ? Special Requests NONE  Final  ? Culture (A)  Final  ?  >=100,000 COLONIES/mL KLEBSIELLA PNEUMONIAE ?Confirmed Extended Spectrum Beta-Lactamase Producer (ESBL).  In bloodstream infections from ESBL organisms, carbapenems are preferred over piperacillin/tazobactam. They are shown to have a lower risk of mortality. ?80,000 COLONIES/mL VIRIDANS STREPTOCOCCUS ?Standardized susceptibility testing for this organism is not available. ?Performed at [facility], [place] [address] [place], [place] [address] ?  ? Report Status 04/24/2022 FINAL  Final  ? Organism ID, Bacteria KLEBSIELLA PNEUMONIAE (A)  Final  ?    Susceptibility  ? Klebsiella pneumoniae - MIC*  ?  AMPICILLIN >=32 RESISTANT Resistant   ?  CEFAZOLIN >=64 RESISTANT Resistant   ?  CEFEPIME 8 INTERMEDIATE Intermediate   ?  CEFTRIAXONE >=64 RESISTANT Resistant   ?  CIPROFLOXACIN >=4 RESISTANT Resistant   ?  GENTAMICIN <=1 SENSITIVE Sensitive   ?  IMIPENEM <=0.25 SENSITIVE Sensitive   ?  NITROFURANTOIN 64 INTERMEDIATE Intermediate   ?  TRIMETH/SULFA >=320 RESISTANT Resistant   ?  AMPICILLIN/SULBACTAM >=32 RESISTANT Resistant   ?  PIP/TAZO 32 INTERMEDIATE Intermediate   ?  * >=100,000 COLONIES/mL KLEBSIELLA PNEUMONIAE  ?  ? ? ?Radiology Studies: ?No results found. ? ? ? LOS: 3 days  ? ? ?Cordelia Poche, MD ?[facility] ?04/25/2022, 2:23 PM ? ? ?If 7PM-7AM, please contact night-coverage ?[email or web address] ? ?

## 2022-04-26 LAB — BASIC METABOLIC PANEL
Anion gap: 7 (ref 5–15)
BUN: 37 mg/dL — ABNORMAL HIGH (ref 8–23)
CO2: 23 mmol/L (ref 22–32)
Calcium: 8.4 mg/dL — ABNORMAL LOW (ref 8.9–10.3)
Chloride: 105 mmol/L (ref 98–111)
Creatinine, Ser: 1.42 mg/dL — ABNORMAL HIGH (ref 0.44–1.00)
GFR, Estimated: 36 mL/min — ABNORMAL LOW (ref >60)
Glucose, Bld: 120 mg/dL — ABNORMAL HIGH (ref 70–99)
Potassium: 3.7 mmol/L (ref 3.5–5.1)
Sodium: 135 mmol/L (ref 135–145)

## 2022-04-26 NOTE — Progress Notes (Signed)
? ?PROGRESS NOTE ? ? ? ?[name] Dyer  ZOX:[id number] DOB: 1932-05-06 DOA: 04/21/2022 ?PCP: Ngetich, [name], NP ? ? ?Brief Narrative: ?[name] Dyer is a 88 y.o. female with a history of hypertension, hyperlipidemia, CHF, atrial fibrillation, dementia. Patient presented secondary to progressive weakness. Patient was found to have evidence of a UTI in addition to evidence of a subacute cerebellar CVA. Antibiotics initiated and neurology consulted. ? ? ?Assessment and Plan: ? ?Acute metabolic encephalopathy ?Likely secondary to UTI. Seems to be improving. Unable to verify baseline at this time. ? ?Subacute left cerebellar CVA ?Neurology consulted. MRI significant for punctate lesion in left cerebellum. MRA head significant for intracranial atherosclerotic disease. Carotid dopplers unremarkable for stenosis. Transthoracic Echocardiogram from three months prior for normal EF with no evidence of thrombus or interatrial shunt. LDL of 66. Hemoglobin A1C of 5.9%. Neurology recommendations to continue Xarelto with no other medication recommendations. ? ?ESBL Klebsiella UTI ?Urinalysis suggestive of UTI. Patient started empirically on Ceftriaxone IV. Urine culture preliminary results significant for Klebsiella pneumoniae and streptococcus viridans and final culture significant for ESBL Klebsiella pneumoniae. ?- meropenem IV x3 days total if good response ? ?Hypokalemia ?Potassium as low as 2.4 on BMP. Given potassium supplementation with improvement. Now resolved. ? ?Dementia with behavioral disturbance ?Stable. ? ?AKI on CKD stage IIIb ?Baseline creatinine is about 1.4. Peak creatinine of 2.67 this admission, with downtrend to baseline. AKI now resolved. ? ?Permanent atrial fibrillation ?-Continue atenolol and Xarelto ? ?Primary hypertension ?-Continue atenolol  ? ?Diastolic heart failure ?Euvolemic. Patient is on Lasix as an outpatient, which was held secondary to AKI. ?-Restart home Lasix now that AKI has  resolved ? ?Hyperlipidemia ?-Continue Crestor ? ?Neuropathy ?-Continue gabapentin ? ?Elevated AST/ALP ?Minimally elevated. No symptoms. ? ?Gout ?-Continue allopurinol ? ?Possible dysphagia ?Patient with coughing after food per nursing. ?-Repeat SLP evaluation ? ?  ?DVT prophylaxis: Xarelto ?Code Status:   Code Status: Full Code ?Family Communication: None at bedside. Called son but no response (4/29) ?Disposition Plan: Discharge home likely in 24 hours after completion of IV antibiotics ? ? ?Consultants:  ?Neurology ? ?Procedures:  ?None ? ?Antimicrobials: ?Ceftriaxone IV ?Meropenem IV ? ? ?Subjective: ?Patient reports being so-so. When asked why not "good," she states she is almost there. No concerns. ? ?Objective: ?BP 119/78 (BP Location: Right Arm)   Pulse 97   Temp 98 ?F (36.7 ?C) (Oral)   Resp 14   Ht _0  (1.626 m)   SpO2 96%   BMI 20.92 kg/m?  ? ?Examination: ? ?General exam: Appears calm and comfortable ?Respiratory system: Clear to auscultation. Respiratory effort normal. ?Cardiovascular system: S1 & S2 heard, irregular rhythm ?Gastrointestinal system: Abdomen is nondistended, soft and nontender. Normal bowel sounds heard. ?Central nervous system: Alert and oriented to person and place. ?Musculoskeletal:  No calf tenderness ?Skin: No cyanosis. No rashes ? ? ?Data Reviewed: I have personally reviewed following labs and imaging studies ? ?CBC ?Lab Results  ?Component Value Date  ? WBC 6.1 04/23/2022  ? RBC 3.89 04/23/2022  ? HGB 11.3 (L) 04/23/2022  ? HCT 34.6 (L) 04/23/2022  ? MCV 88.9 04/23/2022  ? MCH 29.0 04/23/2022  ? PLT PLATELET CLUMPS NOTED ON SMEAR, UNABLE TO ESTIMATE 04/23/2022  ? MCHC 32.7 04/23/2022  ? RDW 19.3 (H) 04/23/2022  ? LYMPHSABS 0.5 (L) 04/21/2022  ? MONOABS 0.7 04/21/2022  ? EOSABS 0.5 04/21/2022  ? BASOSABS 0.1 04/21/2022  ? ? ? ?Last metabolic panel ?Lab Results  ?Component Value Date  ?  NA 135 04/26/2022  ? K 3.7 04/26/2022  ? CL 105 04/26/2022  ? CO2 23 04/26/2022  ? BUN 37  (H) 04/26/2022  ? CREATININE 1.42 (H) 04/26/2022  ? GLUCOSE 120 (H) 04/26/2022  ? GFRNONAA 36 (L) 04/26/2022  ? GFRAA 50 (L) 11/08/2020  ? CALCIUM 8.4 (L) 04/26/2022  ? PROT 6.0 (L) 04/23/2022  ? ALBUMIN 2.8 (L) 04/23/2022  ? LABGLOB 2.2 05/17/2021  ? AGRATIO 2.0 05/17/2021  ? BILITOT 1.0 04/23/2022  ? ALKPHOS 114 04/23/2022  ? AST 45 (H) 04/23/2022  ? ALT 31 04/23/2022  ? ANIONGAP 7 04/26/2022  ? ? ?GFR: ?Estimated Creatinine Clearance: 23.6 mL/min (A) (by C-G formula based on SCr of 1.42 mg/dL (H)). ? ?Recent Results (from the past 240 hour(s))  ?Urine Culture     Status: Abnormal  ? Collection Time: 04/22/22  3:51 AM  ? Specimen: Urine, Clean Catch  ?Result Value Ref Range Status  ? Specimen Description URINE, CLEAN CATCH  Final  ? Special Requests NONE  Final  ? Culture (A)  Final  ?  >=100,000 COLONIES/mL KLEBSIELLA PNEUMONIAE ?Confirmed Extended Spectrum Beta-Lactamase Producer (ESBL).  In bloodstream infections from ESBL organisms, carbapenems are preferred over piperacillin/tazobactam. They are shown to have a lower risk of mortality. ?80,000 COLONIES/mL VIRIDANS STREPTOCOCCUS ?Standardized susceptibility testing for this organism is not available. ?Performed at [facility], [place] [address] [place], [place] [address] ?  ? Report Status 04/24/2022 FINAL  Final  ? Organism ID, Bacteria KLEBSIELLA PNEUMONIAE (A)  Final  ?    Susceptibility  ? Klebsiella pneumoniae - MIC*  ?  AMPICILLIN >=32 RESISTANT Resistant   ?  CEFAZOLIN >=64 RESISTANT Resistant   ?  CEFEPIME 8 INTERMEDIATE Intermediate   ?  CEFTRIAXONE >=64 RESISTANT Resistant   ?  CIPROFLOXACIN >=4 RESISTANT Resistant   ?  GENTAMICIN <=1 SENSITIVE Sensitive   ?  IMIPENEM <=0.25 SENSITIVE Sensitive   ?  NITROFURANTOIN 64 INTERMEDIATE Intermediate   ?  TRIMETH/SULFA >=320 RESISTANT Resistant   ?  AMPICILLIN/SULBACTAM >=32 RESISTANT Resistant   ?  PIP/TAZO 32 INTERMEDIATE Intermediate   ?  * >=100,000 COLONIES/mL KLEBSIELLA PNEUMONIAE  ?   ? ? ?Radiology Studies: ?DG CHEST PORT 1 VIEW ? ?Result Date: 04/25/2022 ?CLINICAL DATA:  Tachypnea. EXAM: PORTABLE CHEST 1 VIEW COMPARISON:  April 21, 2022 FINDINGS: The cardiac silhouette is mildly enlarged and unchanged in size. Both lungs are clear. Multilevel degenerative changes seen throughout the thoracic spine. IMPRESSION: No active cardiopulmonary disease. Electronically Signed   By: Virgina Norfolk M.D.   On: 04/25/2022 21:23   ? ? ? LOS: 4 days  ? ? ?Cordelia Poche, MD ?[facility] ?04/26/2022, 7:28 AM ? ? ?If 7PM-7AM, please contact night-coverage ?[email or web address] ? ?

## 2022-04-26 NOTE — Plan of Care (Signed)
?  Problem: Education: ?Goal: Knowledge of General Education information will improve ?Description: Including pain rating scale, medication(s)/side effects and non-pharmacologic comfort measures ?Outcome: Progressing ?  ?Problem: Health Behavior/Discharge Planning: ?Goal: Ability to manage health-related needs will improve ?Outcome: Progressing ?  ?Problem: Clinical Measurements: ?Goal: Ability to maintain clinical measurements within normal limits will improve ?Outcome: Progressing ?Goal: Will remain free from infection ?Outcome: Progressing ?Goal: Diagnostic test results will improve ?Outcome: Progressing ?Goal: Respiratory complications will improve ?Outcome: Progressing ?Goal: Cardiovascular complication will be avoided ?Outcome: Progressing ?  ?Problem: Activity: ?Goal: Risk for activity intolerance will decrease ?Outcome: Progressing ?  ?Problem: Nutrition: ?Goal: Adequate nutrition will be maintained ?Outcome: Progressing ?  ?Problem: Coping: ?Goal: Level of anxiety will decrease ?Outcome: Progressing ?  ?Problem: Elimination: ?Goal: Will not experience complications related to bowel motility ?Outcome: Progressing ?Goal: Will not experience complications related to urinary retention ?Outcome: Progressing ?  ?Problem: Pain Managment: ?Goal: General experience of comfort will improve ?Outcome: Progressing ?  ?Problem: Safety: ?Goal: Ability to remain free from injury will improve ?Outcome: Progressing ?  ?Problem: Skin Integrity: ?Goal: Risk for impaired skin integrity will decrease ?Outcome: Progressing ?  ?

## 2022-04-27 DIAGNOSIS — B9689 Other specified bacterial agents as the cause of diseases classified elsewhere: Secondary | ICD-10-CM

## 2022-04-27 DIAGNOSIS — N390 Urinary tract infection, site not specified: Secondary | ICD-10-CM

## 2022-04-27 NOTE — TOC Transition Note (Signed)
Transition of Care (TOC) - CM/SW Discharge Note ? ? ?Patient Details  ?Name: [name] Dyer ?MRN: [id number] ?Date of Birth: 08/27/1932 ? ?Transition of Care (TOC) CM/SW Contact:  ?Carles Collet, RN ?Phone Number: ?04/27/2022, 12:34 PM ? ? ?Clinical Narrative:   Spoke w patient's son [name] Dyer.  ?Her verbalized concern that patient has not been up and walking. Reviewed initial recs for SNF that were declined for [facility] services and that [facility] services would see patient three times a week at home. He was agreeable to home health services and requested that PT see patient one more time before DC if they had time. He questioned need for barium swallow before DC and explained that this will likely need to be doe at outpatient follow up.  ?Passed these questions on to Dr Lonny Prude who spoke w son prior to them leaving.  ?Requested [place] RN PT OT SLP for DC. Services set up with [facility] with whom they were recently active.  ?Verified they had no DME needs. ? ? ? [name] Dyer, [name] Dyer (Son)  ?[phone]  ? ? ? ? ?Final next level of care: [place] ?Barriers to Discharge: No Barriers Identified ? ? ?Patient Goals and CMS Choice ?Patient states their goals for this hospitalization and ongoing recovery are:: patient unable to participate in goal setting, not oriented ?CMS Medicare.gov Compare Post Acute Care list provided to:: Other (Comment Required) ?Choice offered to / list presented to : Adult Children ? ?Discharge Placement ?  ?           ?  ?  ?  ?  ? ?Discharge Plan and Services ?  ?  ?Post Acute Care Choice: IP Rehab          ?DME Arranged: N/A ?  ?  ?  ?  ?HH Arranged: RN, PT, OT, Nurse's Aide, Speech Therapy ?[place] Agency: [facility] ?Date HH Agency Contacted: 04/27/22 ?Time [place]: [id number] ?Representative spoke with at [place]: Danise Mina ? ?Social Determinants of Health (SDOH) Interventions ?  ? ? ?Readmission Risk Interventions ?   ? View : No data to display.  ?  ?  ?  ? ? ? ? ? ?

## 2022-04-27 NOTE — Discharge Summary (Addendum)
?Physician Discharge Summary ?  ?Patient: [name] Dyer MRN: [id number] DOB: 08-09-1932  ?Admit date:     04/21/2022  ?Discharge date: 04/27/22  ?Discharge Physician: Cordelia Poche, MD  ? ?PCP: Ngetich, [name], NP  ? ?Recommendations at discharge:  ? ?Hospital follow-up with PCP in one week ?Repeat BMP in 3-5 days ?Recommend outpatient modified barium swallow within 1 week if able ? ?Discharge Diagnoses: ?Principal Problem: ?  Urinary tract infection due to ESBL Klebsiella ?Active Problems: ?  CVA (cerebral vascular accident) ([place]) ?  Chronic diastolic CHF (congestive heart failure) ([place]) ?  Permanent atrial fibrillation ([place]) ?  Hypertension ?  Chronic anticoagulation ?  Alzheimer's disease ([place]) ?  Neuropathy ?  Acute kidney injury superimposed on chronic kidney disease ([place]) ? ?Resolved Problems: ?  * No resolved hospital problems. * ? ?Hospital Course: ?[name] Dyer is a 88 y.o. female with a history of hypertension, hyperlipidemia, CHF, atrial fibrillation, dementia. Patient presented secondary to progressive weakness. Patient was found to have evidence of a UTI in addition to evidence of a subacute cerebellar CVA. Antibiotics initiated and neurology consulted. Urine culture significant for ESBL Klebsiella UTI and patient managed on meropenem. She received 3 days of treatment and completed course. ? ?Assessment and Plan: ?Acute metabolic encephalopathy ?Likely secondary to UTI. Seems to be resolved. ?  ?Subacute left cerebellar CVA ?Neurology consulted. MRI significant for punctate lesion in left cerebellum. MRA head significant for intracranial atherosclerotic disease. Carotid dopplers unremarkable for stenosis. Transthoracic Echocardiogram from three months prior for normal EF with no evidence of thrombus or interatrial shunt. LDL of 66. Hemoglobin A1C of 5.9%. Neurology recommendations to continue Xarelto with no other medication recommendations. ?  ?ESBL Klebsiella UTI ?Urinalysis suggestive of UTI.  Patient started empirically on Ceftriaxone IV. Urine culture significant for ESBL Klebsiella pneumoniae. Patient completed 3 days of meropenem IV. ?  ?Hypokalemia ?Potassium as low as 2.4 on BMP. Given potassium supplementation with improvement. Now resolved. Resume home potassium regimen on discharge. ?  ?Dementia with behavioral disturbance ?Stable. ?  ?AKI on CKD stage IIIb ?Baseline creatinine is about 1.4. Peak creatinine of 2.67 this admission, with downtrend to baseline. AKI now resolved. ?  ?Permanent atrial fibrillation ?Continue atenolol and Xarelto ?  ?Primary hypertension ?Continue atenolol  ?  ?Diastolic heart failure ?Euvolemic. Patient is on Lasix and metolazone as an outpatient, which was held secondary to AKI. Resume home regimen. ?  ?Hyperlipidemia ?Continue Crestor ?  ?Neuropathy ?Continue gabapentin ?  ?Elevated AST/ALP ?Minimally elevated. No symptoms. ?  ?Gout ?Continue allopurinol/colchicine. ?  ?Possible dysphagia ?Patient with coughing after food per nursing but this is baseline per speech therapy. Patient with concern for possible esophageal dysphagia, however patient was not having significant issues with swallowing at this time. Speech therapy reevaluated patient and have recommended a modified barium swallow. Unfortunately, this was not able to be done prior to discharge so recommend outpatient modified barium swallow within the week if possible. ? ? ?Consultants: Neurology ?Procedures performed: None  ?Disposition: Home health ?Diet recommendation:  ?Cardiac diet ? ?DISCHARGE MEDICATION: ?Allergies as of 04/27/2022   ? ?   Reactions  ? Ciprofloxacin Diarrhea  ? Sulfa Antibiotics Hives, Rash  ? Severe Rash  ? Memantine Other (See Comments)  ? Dizziness. Pt takes this medication at home  ? Succinylsulphathiazole Rash  ? Sulfamethoxazole Rash  ? ?  ? ?  ?Medication List  ?  ? ?TAKE these medications   ? ?acetaminophen 500 MG tablet ?Commonly known  as: TYLENOL ?Take 1,000 mg by mouth every 6  (six) hours as needed for mild pain or headache. ?  ?alendronate 70 MG tablet ?Commonly known as: FOSAMAX ?Take 1 tablet (70 mg total) by mouth once a week. ?  ?allopurinol 100 MG tablet ?Commonly known as: ZYLOPRIM ?Take 1 tablet (100 mg total) by mouth daily. ?  ?atenolol 50 MG tablet ?Commonly known as: TENORMIN ?Take 1 tablet (50 mg total) by mouth 2 (two) times daily. ?  ?calcium carbonate 1250 (500 Ca) MG tablet ?Commonly known as: OS-CAL - dosed in mg of elemental calcium ?Take 1 tablet (1,250 mg total) by mouth in the morning. ?  ?cholecalciferol 25 MCG (1000 UNIT) tablet ?Commonly known as: VITAMIN D3 ?Take 2,000 Units by mouth daily. ?  ?colchicine 0.6 MG tablet ?Take 1 tablet (0.6 mg total) by mouth daily as needed (As needed for gout flare). Follow up with primary care provider for refill ?  ?donepezil 10 MG tablet ?Commonly known as: ARICEPT ?Take 1 tablet (10 mg total) by mouth at bedtime. ?  ?furosemide 40 MG tablet ?Commonly known as: LASIX ?TAKE 40 MG DAILY ALTERNATING EVERY OTHER DAY WITH 60 MG (1 AND 1/2 TABLET)DAILY ?What changed:  ?how much to take ?how to take this ?when to take this ?additional instructions ?  ?gabapentin 300 MG capsule ?Commonly known as: NEURONTIN ?TAKE 1 CAPSULE BY MOUTH THREE TIMES A DAY ?What changed: See the new instructions. ?  ?ICY HOT EX ?Apply 1 application. topically 2 (two) times daily as needed (Foot pain). ?  ?ipratropium 0.06 % nasal spray ?Commonly known as: ATROVENT ?CAN USE 2 SPRAYS IN EACH NOSTRIL EVERY 6 HOURS IF NEEDED TO DRY UP RUNNY N0SE ?What changed: See the new instructions. ?  ?memantine 5 MG tablet ?Commonly known as: NAMENDA ?Take 1 tablet (5 mg total) by mouth 2 (two) times daily. ?  ?metolazone 2.5 MG tablet ?Commonly known as: ZAROXOLYN ?TAKE ONE TABLET BY MOUTH TWICE WEEKLY - 30 MINUTES PRIOR TO TAKING YOUR 60MG DOSE OF FUROSEMIDE ?What changed: See the new instructions. ?  ?MULTIPLE VITAMINS/WOMENS PO ?Take 1 tablet by mouth daily. ?   ?potassium chloride SA 20 MEQ tablet ?Commonly known as: KLOR-CON M ?Take 1 tablet (20 mEq total) by mouth daily. ?  ?rosuvastatin 5 MG tablet ?Commonly known as: CRESTOR ?Take 1 tablet (5 mg total) by mouth daily. ?  ?traZODone 50 MG tablet ?Commonly known as: DESYREL ?TAKE 1 TABLET EVERY DAY BY ORAL ROUTE AS NEEDED. ?What changed: See the new instructions. ?  ?vitamin B-12 1000 MCG tablet ?Commonly known as: CYANOCOBALAMIN ?Take 1,000 mcg by mouth daily. ?  ?vitamin C 250 MG tablet ?Commonly known as: ASCORBIC ACID ?Take 250 mg by mouth daily. ?  ?Xarelto 15 MG Tabs tablet ?Generic drug: Rivaroxaban ?TAKE 1 TABLET (15 MG TOTAL) BY MOUTH DAILY WITH SUPPER. ?  ? ?  ? ? Follow-up Information   ? ? [facility].   ?Specialty: Home Health Services ?Why: for home health services. they will cal lyou in 1-2 days to set up a home appointment ?Contact information: ?[address] ?[name] [address] ?[phone] ? ? ?  ?  ? ? Ngetich, [name], NP. Schedule an appointment as soon as possible for a visit in 1 week(s).   ?Specialty: Family Medicine ?Why: For hospital follow-up ?Contact information: ?[address] ?[place] [address] ?[phone] ? ? ?  ?  ? ?  ?  ? ?  ? ?  Discharge Exam: ?BP 116/83 (BP Location: Right Arm)   Pulse 93   Temp 97.7 ?F (36.5 ?C) (Oral)   Resp 17   Ht _0  (1.626 m)   SpO2 97%   BMI 20.92 kg/m?  ? ?General exam: Appears calm and comfortable ?Respiratory system: Clear to auscultation. Respiratory effort normal. ?Cardiovascular system: S1 & S2 heard, irregular rhythm with normal rate. ?Gastrointestinal system: Abdomen is nondistended, soft and nontender. Normal bowel sounds heard. ?Central nervous system: Alert and oriented to person and place. ?Musculoskeletal: No edema. No calf tenderness  ? ?Condition at discharge: stable ? ?The results of significant diagnostics from this hospitalization (including imaging, microbiology, ancillary and  laboratory) are listed below for reference.  ? ?Imaging Studies: ?DG Chest 2 View ? ?Result Date: 04/21/2022 ?CLINICAL DATA:  Altered mental status. EXAM: CHEST - 2 VIEW COMPARISON:  Chest x-ray 01/09/2022. FINDINGS: The

## 2022-04-27 NOTE — Evaluation (Signed)
Clinical/Bedside Swallow Evaluation ?Patient Details  ?Name: [name] Dyer ?MRN: [id number] ?Date of Birth: 18-Aug-1932 ? ?Today's Date: 04/27/2022 ?Time: SLP Start Time (ACUTE ONLY): 1200 SLP Stop Time (ACUTE ONLY): 4235 ?SLP Time Calculation (min) (ACUTE ONLY): 20 min ? ?Past Medical History:  ?Past Medical History:  ?Diagnosis Date  ? Alzheimer disease ([place])   ? Anemia   ? Arrhythmia   ? Cataract   ? Colon polyps   ? Community acquired pneumonia 01/2012  ? LLL  ? Congestive heart failure ([place])   ? Hyperlipidemia   ? Hypertension   ? Memory difficulty 10/01/2016  ? Osteoporosis   ? Pleural effusion, right 01/2012  ? SVT (supraventricular tachycardia) ([place]) 01/2012  ? UTI (lower urinary tract infection)   ? ?Past Surgical History:  ?Past Surgical History:  ?Procedure Laterality Date  ? ABDOMINAL HYSTERECTOMY  1983  ? APPENDECTOMY  1983  ? COLONOSCOPY    ? COLONOSCOPY N/A 06/10/2016  ? Procedure: COLONOSCOPY;  Surgeon: Manus Gunning, MD;  Location: [facility];  Service: Gastroenterology;  Laterality: N/A;  ? ESOPHAGOGASTRODUODENOSCOPY N/A 06/10/2016  ? Procedure: ESOPHAGOGASTRODUODENOSCOPY (EGD);  Surgeon: Manus Gunning, MD;  Location: [facility];  Service: Gastroenterology;  Laterality: N/A;  ? HEMIARTHROPLASTY HIP  2011  ? Left femoral neck fracture  ? RIGHT/LEFT HEART CATH AND CORONARY ANGIOGRAPHY N/A 01/08/2022  ? Procedure: RIGHT/LEFT HEART CATH AND CORONARY ANGIOGRAPHY;  Surgeon: Leonie Man, MD;  Location: [facility];  Service: Cardiovascular;  Laterality: N/A;  ? UPPER GASTROINTESTINAL ENDOSCOPY    ? ?HPI:  ?[name] Dyer is a 88 y.o. female with PMH significant for HTN, HLD, osteoporosis, Afibb on Xarelto, chronic memory disturbance, who presents with generalized weakness.  Workup in the ED with MRI demonstrating a punctate L cerebellum infarct.  Failed [facility].  Pt had MBS in 1/23 "[name] oropharyngeal swallowing is functional with adequate efficiency and no aspiration. She  had intermittent throat clearing and coughing throughout the study despite no aspiration occurring. She did appear to have barium, including barium tablet, remaining in her esophagus upon completion of the study."  ?  ?Assessment / Plan / Recommendation  ?Clinical Impression ? Repeat swallow evaluation ordered given nursing reports of significant coughing with and after po intake. Patient continues to demonstrate adeqaute ability to eat and drink. Oral and pharyngeal phase of swallow appearing relatively consistent with MBS completed three months ago at this hospital which noted occassional throat clearing/cough despite full airway protection although during today's evaluation, coughing did increase significantly with po intake. Cough was dry in nature and did not necessarily appear related to po intake although difficulty to judge at bedside. Discussed with son who was present for evaluation this am. He is concerned about swallowing function and would like to proceed cautiously. It was decided that given acute medical changes (UTI, CVA), we will proceed with instrumental testing to evaluate for possible changes in function. Unfortuantely this cannot be completed today. We can plan for MBS tomorrow. Note d/c summary in chart. If patient is to discharge today, this could be completed as OP but would be easier for patient and family to complete while inpatient if possible. As previously, there is the possibility of esophageal dysphagia causing this cough reflex. If no changes noted on MBS, F/u with gastroenterology or with esophagram would be appropriate to address this. ?SLP Visit Diagnosis: Dysphagia, unspecified (R13.10) ?   ?Aspiration Risk ? Mild aspiration risk  ?  ?Diet Recommendation Regular;Thin liquid  ? ?Liquid  Administration via: Cup;Straw ?Medication Administration: Whole meds with liquid ?Supervision: Patient able to self feed ?Compensations: Slow rate;Small sips/bites ?Postural Changes: Seated upright  at 90 degrees  ?  ?Other  Recommendations Oral Care Recommendations: Oral care BID   ? ?Recommendations for follow up therapy are one component of a multi-disciplinary discharge planning process, led by the attending physician.  Recommendations may be updated based on patient status, additional functional criteria and insurance authorization. ? ? ? ?Swallow Study   ?General HPI: [name] Dyer is a 88 y.o. female with PMH significant for HTN, HLD, osteoporosis, Afibb on Xarelto, chronic memory disturbance, who presents with generalized weakness.  Workup in the ED with MRI demonstrating a punctate L cerebellum infarct.  Failed [facility].  Pt had MBS in 1/23 "[name] oropharyngeal swallowing is functional with adequate efficiency and no aspiration. She had intermittent throat clearing and coughing throughout the study despite no aspiration occurring. She did appear to have barium, including barium tablet, remaining in her esophagus upon completion of the study." ?Type of Study: Bedside Swallow Evaluation ?Previous Swallow Assessment: seen during this admission with no f/u recommended ?Diet Prior to this Study: Regular;Thin liquids ?Temperature Spikes Noted: No ?Respiratory Status: Room air ?History of Recent Intubation: No ?Behavior/Cognition: Alert;Cooperative;Pleasant mood ?Oral Cavity Assessment: Within Functional Limits ?Oral Care Completed by SLP: No ?Oral Cavity - Dentition: Adequate natural dentition ?Vision: Functional for self-feeding ?Self-Feeding Abilities: Able to feed self ?Patient Positioning: Upright in bed ?Baseline Vocal Quality: Normal ?Volitional Cough: Strong ?Volitional Swallow: Able to elicit  ?  ?Oral/Motor/Sensory Function Overall Oral Motor/Sensory Function: Within functional limits   ?Ice Chips Ice chips: Not tested   ?Thin Liquid Thin Liquid: Within functional limits  ?  ?Nectar Thick Nectar Thick Liquid: Not tested   ?Honey Thick Honey Thick Liquid: Not tested   ?Puree Puree: Within functional  limits   ?Solid ? ? ?  Solid: Within functional limits  ? ?  ?[name] Salminen MA, CCC-SLP ? ?[name] Dyer ?04/27/2022,12:29 PM ? ? ? ?

## 2022-04-28 ENCOUNTER — Other Ambulatory Visit: Payer: Self-pay

## 2022-04-28 ENCOUNTER — Telehealth: Payer: Self-pay

## 2022-04-28 DIAGNOSIS — M10072 Idiopathic gout, left ankle and foot: Secondary | ICD-10-CM

## 2022-04-28 MED ORDER — COLCHICINE 0.6 MG PO TABS: ORAL_TABLET | ORAL | 0 refills | Status: AC

## 2022-04-28 NOTE — Telephone Encounter (Signed)
Colchicine 0.6 mg tablet Take 2 tablets by mouth x 1 dose then one tablet daily x 3 days wait 12 hours prior to resuming Allopurinol  ?

## 2022-04-28 NOTE — Telephone Encounter (Signed)
I discussed response with patients son and informed him rx sent  ?

## 2022-04-28 NOTE — Telephone Encounter (Addendum)
Incoming call received from patents son stating patient with an acute gout flare up in both feet. ? ?Patient needs a refill on colchicine and needs instructions on how to take. ? ?Patients son questions if she takes the colchicine along with the allopurinol,  and if so how long does she take the colchicine  ? ? ?Please advise  ?

## 2022-04-28 NOTE — Telephone Encounter (Signed)
Transition Care Management Unsuccessful Follow-up Telephone Call ? ?Date of discharge and from where:  04/21/2022; [facility] ? ? ?Attempts:  1st Attempt ? ?Reason for unsuccessful TCM follow-up call:  Unable to reach patient ? ?Spoke with patients son, states mother is not able to take care of herself . Says she is not strong enough to come in or do virtual visit right now had few questions for doctor so will call again tomorrow when I have information on how to proceed ? ?

## 2022-04-30 ENCOUNTER — Telehealth: Payer: Self-pay

## 2022-04-30 NOTE — Telephone Encounter (Signed)
Opened in error

## 2022-04-30 NOTE — Telephone Encounter (Signed)
Transition Care Management Follow-up Telephone Call ?Date of discharge and from where: 04/27/2022 ?How have you been since you were released from the hospital? Up and down slowly doing better ?Any questions or concerns? No ? ?Items Reviewed: ?Did the pt receive and understand the discharge instructions provided? Yes  ?Medications obtained and verified? Yes  ?Other?  N/a ?Any new allergies since your discharge? No  ?Dietary orders reviewed? No ?Do you have support at home? Yes  ? ?Home Care and Equipment/Supplies: ?Were home health services ordered? no ?If so, what is the name of the agency? N/A  ?Has the agency set up a time to come to the patient's home? no ?Were any new equipment or medical supplies ordered?  Yes: no ?What is the name of the medical supply agency? N/a ?Were you able to get the supplies/equipment? not applicable ?Do you have any questions related to the use of the equipment or supplies? No ? ?Functional Questionnaire: (I = Independent and D = Dependent) ?ADLs: dependant ? ?Bathing/Dressing- with assistance ? ?Meal Prep- with assistance ? ?Eating- yes ? ?Maintaining continence- yes ? ?Transferring/Ambulation- with assistance ? ?Managing Meds- no ? ?Follow up appointments reviewed: ? ?PCP Hospital f/u appt confirmed? Yes  Scheduled to see Marlowe Sax, NP on 05/09/2022 @ 2:00pm. ?[facility] f/u appt confirmed? No  Scheduled to see N/A on n/a @ n/a. ?Are transportation arrangements needed? No  ?If their condition worsens, is the pt aware to call PCP or go to the Emergency Dept.? Yes ?Was the patient provided with contact information for the PCP's office or ED? Yes ?Was to pt encouraged to call back with questions or concerns? Yes ? ?

## 2022-05-02 ENCOUNTER — Telehealth: Payer: Self-pay | Admitting: *Deleted

## 2022-05-02 NOTE — Telephone Encounter (Signed)
[name] Dyer with [facility] called requesting Verbal OT orders for 1X2weeks, 2X2weeks and 1X reassessment.  ? ?Verbal orders given.  ?

## 2022-05-05 ENCOUNTER — Other Ambulatory Visit: Payer: Self-pay

## 2022-05-05 ENCOUNTER — Telehealth: Payer: Self-pay | Admitting: *Deleted

## 2022-05-05 DIAGNOSIS — I130 Hypertensive heart and chronic kidney disease with heart failure and stage 1 through stage 4 chronic kidney disease, or unspecified chronic kidney disease: Secondary | ICD-10-CM

## 2022-05-05 NOTE — Progress Notes (Signed)
Patient suppose to be scheduled for 2 weeks lab follow-up on 05/06/22 but lab order was never placed. ?

## 2022-05-05 NOTE — Telephone Encounter (Signed)
Sonia Baller, ST with [facility] called requesting verbal orders for ST 1x2weeks and 1x1weeks.  ?Verbal orders given.  ?

## 2022-05-06 ENCOUNTER — Encounter: Payer: Self-pay | Admitting: Family

## 2022-05-06 ENCOUNTER — Other Ambulatory Visit: Payer: Medicare Other

## 2022-05-06 ENCOUNTER — Telehealth (INDEPENDENT_AMBULATORY_CARE_PROVIDER_SITE_OTHER): Payer: Medicare Other | Admitting: Family

## 2022-05-06 DIAGNOSIS — M109 Gout, unspecified: Secondary | ICD-10-CM

## 2022-05-06 NOTE — Progress Notes (Signed)
?This service is provided via telemedicine ? ?No vital signs collected/recorded due to the encounter was a telemedicine visit.  ? ?Location of patient (ex: home, work):  Home. ? ?Patient consents to a telephone visit:  Yes ? ?Location of the provider (ex: office, home):  [employer].  ? ?Name of any referring provider:  Trexton Dyer, [name], NP  ? ?Names of all persons participating in the telemedicine service and their role in the encounter:  Patient, Son [name] Dyer, [name] Dyer, [name] Dyer, [name] Dyer, [place], NP.   ? ?Time spent on call:  8 minutes spent on the phone with Medical Assistant.   ? ?Location:    ?  ?Place of Service:    ?Provider: Marlowe Sax FNP-C ? ?[name] Dyer, [name], NP ? ?Patient Care Team: ?[name] Dyer, [name], NP as PCP - General (Family Medicine) ?[name] Records, MD as PCP - Cardiology (Cardiology) ?[name] Dyer, [name] Poag, MD as Consulting Physician (Allergy and Immunology) ?[name] Dyer, DPM as Consulting Physician (Podiatry) ?[name] Rea, MD as Consulting Physician (Obstetrics and Gynecology) ?[name] Ducking, MD (Inactive) (Neurology) ?[name] Loser, MD as Consulting Physician (Urology) ? ?Extended Emergency Contact Information ?Primary Emergency Contact: Dyer,[name] ? [name] of [place] ?Home Phone: [phone] ?Mobile Phone: [phone] ?Relation: Son ?Secondary Emergency Contact: Moon Dyer,[name] ? [name] ?Home Phone: [phone] ?Mobile Phone: [phone] ?Relation: Son ? ?Code Status:  DNR ?Goals of care: Advanced Directive information ? ?  05/06/2022  ?  3:45 PM  ?Advanced Directives  ?Does Patient Have a Medical Advance Directive? Yes  ?Type of Paramedic of [place];Living will  ?Does patient want to make changes to medical advance directive? No - Patient declined  ?Copy of [place] in Chart? Yes - validated most recent copy scanned in chart (See row information)  ? ? ? ?Chief Complaint   ?Patient presents with  ? Acute Visit  ?  Patient son [name] Dyer complains of pain and swelling in patient feet since leaving hospital 04/21/2022. Patient has also been losing weight.   ? ? ?HPI:  ?Pt is a 88 y.o. female seen today for an acute visit for evaluation of pain and swelling on feet.pain worst when patient walks.son states looks like patient's gout has recurred wonders whether she should take her colchicine previously ordered.Has taken her allopurinol .Has redness and warm to touch on gout areas. ?She was seen in ED on 04/21/2022 Furosemide was changed to 40 mg tablet daily alternate with 60 mg tablet every other day.Son states weight is 123 lbs previous weight here was 121 lbs. ?She denies any fever,chills,cough,fatigue,chest tightness,chest pain,palpitation or shortness of breath. ? ? ? ?Past Medical History:  ?Diagnosis Date  ? Alzheimer disease ([place])   ? Anemia   ? Arrhythmia   ? Cataract   ? Colon polyps   ? Community acquired pneumonia 01/2012  ? LLL  ? Congestive heart failure ([place])   ? Hyperlipidemia   ? Hypertension   ? Memory difficulty 10/01/2016  ? Osteoporosis   ? Pleural effusion, right 01/2012  ? SVT (supraventricular tachycardia) ([place]) 01/2012  ? UTI (lower urinary tract infection)   ? ?Past Surgical History:  ?Procedure Laterality Date  ? ABDOMINAL HYSTERECTOMY  1983  ? APPENDECTOMY  1983  ? COLONOSCOPY    ? COLONOSCOPY N/A 06/10/2016  ? Procedure: COLONOSCOPY;  Surgeon: Manus Gunning, MD;  Location: [facility];  Service: Gastroenterology;  Laterality: N/A;  ? ESOPHAGOGASTRODUODENOSCOPY N/A 06/10/2016  ? Procedure: ESOPHAGOGASTRODUODENOSCOPY (EGD);  Surgeon: Manus Gunning, MD;  Location: [facility];  Service: Gastroenterology;  Laterality: N/A;  ? HEMIARTHROPLASTY HIP  2011  ? Left femoral neck fracture  ? RIGHT/LEFT HEART CATH AND CORONARY ANGIOGRAPHY N/A 01/08/2022  ? Procedure: RIGHT/LEFT HEART CATH AND CORONARY ANGIOGRAPHY;  Surgeon: Leonie Man, MD;   Location: [facility];  Service: Cardiovascular;  Laterality: N/A;  ? UPPER GASTROINTESTINAL ENDOSCOPY    ? ? ?Allergies  ?Allergen Reactions  ? Ciprofloxacin Diarrhea  ? Sulfa Antibiotics Hives and Rash  ?  Severe Rash  ? Memantine Other (See Comments)  ?  Dizziness. Pt takes this medication at home  ? Succinylsulphathiazole Rash  ? Sulfamethoxazole Rash  ? ? ?Outpatient Encounter Medications as of 05/06/2022  ?Medication Sig  ? acetaminophen (TYLENOL) 500 MG tablet Take 1,000 mg by mouth every 6 (six) hours as needed for mild pain or headache.   ? alendronate (FOSAMAX) 70 MG tablet Take 1 tablet (70 mg total) by mouth once a week.  ? allopurinol (ZYLOPRIM) 100 MG tablet Take 1 tablet (100 mg total) by mouth daily.  ? atenolol (TENORMIN) 50 MG tablet Take 1 tablet (50 mg total) by mouth 2 (two) times daily.  ? calcium carbonate (OS-CAL - DOSED IN MG OF ELEMENTAL CALCIUM) 1250 (500 Ca) MG tablet Take 1 tablet (1,250 mg total) by mouth in the morning.  ? cholecalciferol (VITAMIN D3) 25 MCG (1000 UT) tablet Take 2,000 Units by mouth daily.  ? colchicine 0.6 MG tablet Take 2 tablets by mouth x 1 dose then one tablet daily x 3 days wait 12 hours prior to resuming Allopurinol  ? donepezil (ARICEPT) 10 MG tablet Take 1 tablet (10 mg total) by mouth at bedtime.  ? furosemide (LASIX) 40 MG tablet TAKE 40 MG DAILY ALTERNATING EVERY OTHER DAY WITH 60 MG (1 AND 1/2 TABLET)DAILY (Patient taking differently: Take 40-60 mg by mouth See admin instructions. TAKE 40 MG DAILY ALTERNATING EVERY OTHER DAY WITH 60 MG (1 AND 1/2 TABLET)DAILY ?Take 1 tablet (40 mg) daily alternating every other day with 1 and 1/2 tablet (60 mg) daily)  ? gabapentin (NEURONTIN) 300 MG capsule TAKE 1 CAPSULE BY MOUTH THREE TIMES A DAY  ? ipratropium (ATROVENT) 0.06 % nasal spray CAN USE 2 SPRAYS IN EACH NOSTRIL EVERY 6 HOURS IF NEEDED TO DRY UP RUNNY N0SE  ? memantine (NAMENDA) 5 MG tablet Take 1 tablet (5 mg total) by mouth 2 (two) times daily.  ?  Menthol, Topical Analgesic, (ICY HOT EX) Apply 1 application. topically 2 (two) times daily as needed (Foot pain).  ? metolazone (ZAROXOLYN) 2.5 MG tablet TAKE ONE TABLET BY MOUTH TWICE WEEKLY - 30 MINUTES PRIOR TO TAKING YOUR 60MG DOSE OF FUROSEMIDE  ? Multiple Vitamins-Minerals (MULTIPLE VITAMINS/WOMENS PO) Take 1 tablet by mouth daily.  ? potassium chloride SA (KLOR-CON M) 20 MEQ tablet Take 1 tablet (20 mEq total) by mouth daily.  ? Rivaroxaban (XARELTO) 15 MG TABS tablet TAKE 1 TABLET (15 MG TOTAL) BY MOUTH DAILY WITH SUPPER.  ? rosuvastatin (CRESTOR) 5 MG tablet Take 1 tablet (5 mg total) by mouth daily.  ? traZODone (DESYREL) 50 MG tablet Take 50 mg by mouth at bedtime as needed for sleep.  ? vitamin B-12 (CYANOCOBALAMIN) 1000 MCG tablet Take 1,000 mcg by mouth daily.  ? vitamin C (ASCORBIC ACID) 250 MG tablet Take 250 mg by mouth daily.  ? [DISCONTINUED] traZODone (DESYREL) 50 MG tablet TAKE 1 TABLET EVERY DAY BY ORAL ROUTE AS  NEEDED. (Patient taking differently: Take 50 mg by mouth at bedtime as needed for sleep.)  ? ?No facility-administered encounter medications on file as of 05/06/2022.  ? ? ?Review of Systems  ?Constitutional:  Negative for chills, fatigue and fever.  ?Respiratory:  Negative for cough, chest tightness, shortness of breath and wheezing.   ?Cardiovascular:  Positive for leg swelling. Negative for chest pain and palpitations.  ?Musculoskeletal:   ?     Previous gout site on feet swollen,red and warm   ?Skin:  Negative for color change and rash.  ? ?Immunization History  ?Administered Date(s) Administered  ? Influenza, High Dose Seasonal PF 11/19/2018  ? Influenza,inj,Quad PF,6+ Mos 11/17/2018  ? Influenza,inj,quad, With Preservative 10/29/2017  ? Influenza-Unspecified 10/29/2021  ? ?Pertinent  Health Maintenance Due  ?Topic Date Due  ? DEXA SCAN  Never done  ? INFLUENZA VACCINE  07/29/2022  ? ? ?  04/25/2022  ?  9:00 PM 04/26/2022  ?  8:00 AM 04/26/2022  ? 10:00 PM 04/27/2022  ?  8:00 AM  05/06/2022  ?  3:44 PM  ?Fall Risk  ?Falls in the past year?     0  ?Was there an injury with Fall?     0  ?Fall Risk Category Calculator     0  ?Fall Risk Category     Low  ?Patient Fall Risk Level High fall risk High fall

## 2022-05-07 ENCOUNTER — Ambulatory Visit: Payer: Medicare Other | Admitting: Podiatry

## 2022-05-08 NOTE — Patient Instructions (Signed)
Please contact your local pharmacy, previous provider, or insurance carrier for vaccine/immunization records. Ensure that any procedures done outside of [facility] are faxed to [place] [phone] or you can sign release of records form at the front desk to keep your medical record updated.   ?

## 2022-05-09 ENCOUNTER — Encounter: Payer: Self-pay | Admitting: Family

## 2022-05-09 ENCOUNTER — Ambulatory Visit (INDEPENDENT_AMBULATORY_CARE_PROVIDER_SITE_OTHER): Payer: Medicare Other | Admitting: Family

## 2022-05-09 ENCOUNTER — Telehealth: Payer: Self-pay | Admitting: *Deleted

## 2022-05-09 VITALS — BP 126/70 | HR 93 | Temp 97.3°F | Resp 16 | Ht 64.0 in | Wt 123.9 lb

## 2022-05-09 DIAGNOSIS — K219 Gastro-esophageal reflux disease without esophagitis: Secondary | ICD-10-CM

## 2022-05-09 DIAGNOSIS — R600 Localized edema: Secondary | ICD-10-CM

## 2022-05-09 DIAGNOSIS — G609 Hereditary and idiopathic neuropathy, unspecified: Secondary | ICD-10-CM | POA: Diagnosis not present

## 2022-05-09 DIAGNOSIS — I5032 Chronic diastolic (congestive) heart failure: Secondary | ICD-10-CM

## 2022-05-09 MED ORDER — OMEPRAZOLE 20 MG PO CPDR: 20.0000 mg | DELAYED_RELEASE_CAPSULE | Freq: Every day | ORAL | 3 refills | Status: AC

## 2022-05-09 NOTE — Progress Notes (Signed)
? ?Provider: Marlowe Sax FNP-C ? ?Dajon Rowe, [name], NP ? ?Patient Care Team: ?Dorena Dorfman, [name], NP as PCP - General (Family Medicine) ?[name], MD as PCP - Cardiology (Cardiology) ?Kozlow, Donnamarie Poag, MD as Consulting Physician (Allergy and Immunology) ?[name], DPM as Consulting Physician (Podiatry) ?[name], MD as Consulting Physician (Obstetrics and Gynecology) ?[name], MD (Inactive) (Neurology) ?[name], MD as Consulting Physician (Urology) ? ?Extended Emergency Contact Information ?Primary Emergency Contact: Beedy,[name] ? [place] ?Home Phone: [phone] ?Mobile Phone: [phone] ?Relation: Son ?Secondary Emergency Contact: Rohe Jr,[name] ? [place] ?Home Phone: [phone] ?Mobile Phone: [phone] ?Relation: Son ? ?Code Status:  DNR ?Goals of care: Advanced Directive information ? ?  05/09/2022  ?  2:21 PM  ?Advanced Directives  ?Does Patient Have a Medical Advance Directive? Yes  ?Type of Paramedic of [place];Living will  ?Does patient want to make changes to medical advance directive? No - Patient declined  ?Copy of [place] in Chart? Yes - validated most recent copy scanned in chart (See row information)  ? ? ? ?Chief Complaint  ?Patient presents with  ? Transitions Of Care  ?  TOC 04/21/2022-04/27/2022  ? ? ?HPI:  ?Pt is a 88 y.o. female seen today for an acute visit for Transition of care post hospitalization 04/21/2022 - 04/27/2022 for worsening general weakness.  She was found to have urinary tract infection and subacute Celebra will CVA.  She was treated with antibiotics for UTI culture indicated ESBL Klebsiella UTI.  She was treated with meropenem IV x3 days.  ?Neurology was consulted subacute cerebellar CVA.  MRI and head  showed intracranial atherosclerotic disease. Carotid Dopplers were unremarkable.Neurology recommended to continue on Xarelto. ?Also had hypokalemia 2.4  potassium supplement was given with much improvement.  Was advised to continue with home potassium regimen. ?Also had acute kidney injury baseline creatinine 1.4 but worsened to 2.67 during admission that resolved on discharge.  Lasix and metaxalone were held during visit admission due to acute kidney injury. ?Liver enzymes slightly elevated but the symptomatic ?She was seen by speech therapist due to coughing after eating recommended modified barium swallow but this was not done during admission recommended to be done on outpatient.  Patient has been splints it is due to acid reflux would like to try acid reflux medication prior to doing barium swallow.Speech Therapist recommended PPI  ?Has a medical history of essential hypertension, chronic congestive heart failure, atrial fibrillation, hyperlipidemia, dementia ? ? ? ? ?Past Medical History:  ?Diagnosis Date  ? Alzheimer disease ([place])   ? Anemia   ? Arrhythmia   ? Cataract   ? Colon polyps   ? Community acquired pneumonia 01/2012  ? LLL  ? Congestive heart failure ([place])   ? Hyperlipidemia   ? Hypertension   ? Memory difficulty 10/01/2016  ? Osteoporosis   ? Pleural effusion, right 01/2012  ? SVT (supraventricular tachycardia) ([place]) 01/2012  ? UTI (lower urinary tract infection)   ? ?Past Surgical History:  ?Procedure Laterality Date  ? ABDOMINAL HYSTERECTOMY  1983  ? APPENDECTOMY  1983  ? COLONOSCOPY    ? COLONOSCOPY N/A 06/10/2016  ? Procedure: COLONOSCOPY;  Surgeon: Manus Gunning, MD;  Location: [facility];  Service: Gastroenterology;  Laterality: N/A;  ? ESOPHAGOGASTRODUODENOSCOPY N/A 06/10/2016  ? Procedure: ESOPHAGOGASTRODUODENOSCOPY (EGD);  Surgeon: Manus Gunning, MD;  Location: [facility];  Service: Gastroenterology;  Laterality: N/A;  ? HEMIARTHROPLASTY HIP  2011  ?  Left femoral neck fracture  ? RIGHT/LEFT HEART CATH AND CORONARY ANGIOGRAPHY N/A 01/08/2022  ? Procedure: RIGHT/LEFT HEART CATH AND CORONARY ANGIOGRAPHY;  Surgeon: Leonie Man, MD;  Location: [facility];  Service: Cardiovascular;  Laterality: N/A;  ? UPPER GASTROINTESTINAL ENDOSCOPY    ? ? ?Allergies  ?Allergen Reactions  ? Ciprofloxacin Diarrhea  ? Sulfa Antibiotics Hives and Rash  ?  Severe Rash  ? Memantine Other (See Comments)  ?  Dizziness. Pt takes this medication at home  ? Succinylsulphathiazole Rash  ? Sulfamethoxazole Rash  ? ? ?Outpatient Encounter Medications as of 05/09/2022  ?Medication Sig  ? acetaminophen (TYLENOL) 500 MG tablet Take 1,000 mg by mouth every 6 (six) hours as needed for mild pain or headache.   ? alendronate (FOSAMAX) 70 MG tablet Take 1 tablet (70 mg total) by mouth once a week.  ? allopurinol (ZYLOPRIM) 100 MG tablet Take 1 tablet (100 mg total) by mouth daily.  ? atenolol (TENORMIN) 50 MG tablet Take 1 tablet (50 mg total) by mouth 2 (two) times daily.  ? calcium carbonate (OS-CAL - DOSED IN MG OF ELEMENTAL CALCIUM) 1250 (500 Ca) MG tablet Take 1 tablet (1,250 mg total) by mouth in the morning.  ? cholecalciferol (VITAMIN D3) 25 MCG (1000 UT) tablet Take 2,000 Units by mouth daily.  ? colchicine 0.6 MG tablet Take 2 tablets by mouth x 1 dose then one tablet daily x 3 days wait 12 hours prior to resuming Allopurinol  ? donepezil (ARICEPT) 10 MG tablet Take 1 tablet (10 mg total) by mouth at bedtime.  ? furosemide (LASIX) 40 MG tablet TAKE 40 MG DAILY ALTERNATING EVERY OTHER DAY WITH 60 MG (1 AND 1/2 TABLET)DAILY  ? gabapentin (NEURONTIN) 300 MG capsule TAKE 1 CAPSULE BY MOUTH THREE TIMES A DAY  ? ipratropium (ATROVENT) 0.06 % nasal spray CAN USE 2 SPRAYS IN EACH NOSTRIL EVERY 6 HOURS IF NEEDED TO DRY UP RUNNY N0SE  ? memantine (NAMENDA) 5 MG tablet Take 1 tablet (5 mg total) by mouth 2 (two) times daily.  ? Menthol, Topical Analgesic, (ICY HOT EX) Apply 1 application. topically 2 (two) times daily as needed (Foot pain).  ? metolazone (ZAROXOLYN) 2.5 MG tablet TAKE ONE TABLET BY MOUTH TWICE WEEKLY - 30 MINUTES PRIOR TO TAKING YOUR 60MG DOSE  OF FUROSEMIDE  ? Multiple Vitamins-Minerals (MULTIPLE VITAMINS/WOMENS PO) Take 1 tablet by mouth daily.  ? potassium chloride SA (KLOR-CON M) 20 MEQ tablet Take 1 tablet (20 mEq total) by mouth daily.  ? Rivaroxaban (XARELTO) 15 MG TABS tablet TAKE 1 TABLET (15 MG TOTAL) BY MOUTH DAILY WITH SUPPER.  ? rosuvastatin (CRESTOR) 5 MG tablet Take 1 tablet (5 mg total) by mouth daily.  ? traZODone (DESYREL) 50 MG tablet Take 50 mg by mouth at bedtime as needed for sleep.  ? vitamin B-12 (CYANOCOBALAMIN) 1000 MCG tablet Take 1,000 mcg by mouth daily.  ? vitamin C (ASCORBIC ACID) 250 MG tablet Take 250 mg by mouth daily.  ? [DISCONTINUED] traZODone (DESYREL) 50 MG tablet TAKE 1 TABLET EVERY DAY BY ORAL ROUTE AS NEEDED. (Patient taking differently: Take 50 mg by mouth at bedtime as needed for sleep.)  ? ?No facility-administered encounter medications on file as of 05/09/2022.  ? ? ?Review of Systems  ?Constitutional:  Negative for appetite change, chills, fatigue, fever and unexpected weight change.  ?HENT:  Negative for congestion, dental problem, ear discharge, ear pain, facial swelling, hearing loss, nosebleeds, postnasal drip, rhinorrhea, sinus  pressure, sinus pain, sneezing, sore throat, tinnitus and trouble swallowing.   ?Eyes:  Negative for pain, discharge, redness, itching and visual disturbance.  ?Respiratory:  Negative for cough, chest tightness, shortness of breath and wheezing.   ?Cardiovascular:  Negative for chest pain, palpitations and leg swelling.  ?Gastrointestinal:  Negative for abdominal distention, abdominal pain, blood in stool, constipation, diarrhea, nausea and vomiting.  ?Endocrine: Negative for cold intolerance, heat intolerance, polydipsia, polyphagia and polyuria.  ?Genitourinary:  Negative for difficulty urinating, dysuria, flank pain, frequency and urgency.  ?Musculoskeletal:  Positive for arthralgias and gait problem. Negative for back pain, joint swelling, myalgias, neck pain and neck stiffness.   ?Skin:  Negative for color change, pallor, rash and wound.  ?Neurological:  Negative for dizziness, syncope, speech difficulty, weakness, light-headedness, numbness and headaches.  ?Hematological:  D

## 2022-05-09 NOTE — Telephone Encounter (Signed)
Patient seen today by PCP Ngetich, [name] C, NP and concerns addressed.  ?

## 2022-05-09 NOTE — Telephone Encounter (Signed)
Jennie with [facility] called and stated that patient has an appointment today with [name].  ? ?She stated that she thinks patient is suspected for reflux and wants to know if you would do a trial of Omeprazole.  ? ?Patient is coming in today.  ?

## 2022-05-09 NOTE — Telephone Encounter (Signed)
Will address during visit today. ? ?

## 2022-05-10 LAB — BASIC METABOLIC PANEL WITH GFR
BUN/Creatinine Ratio: 26 (calc) — ABNORMAL HIGH (ref 6–22)
BUN: 45 mg/dL — ABNORMAL HIGH (ref 7–25)
CO2: 33 mmol/L — ABNORMAL HIGH (ref 20–32)
Calcium: 10 mg/dL (ref 8.6–10.4)
Chloride: 89 mmol/L — ABNORMAL LOW (ref 98–110)
Creat: 1.73 mg/dL — ABNORMAL HIGH (ref 0.60–0.95)
Glucose, Bld: 103 mg/dL (ref 65–139)
Potassium: 3 mmol/L — ABNORMAL LOW (ref 3.5–5.3)
Sodium: 133 mmol/L — ABNORMAL LOW (ref 135–146)
eGFR: 28 mL/min/{1.73_m2} — ABNORMAL LOW (ref >=60)

## 2022-05-13 NOTE — Progress Notes (Signed)
? ? ?PATIENT: [name] Dyer ?DOB: 1932-05-17 ? ?REASON FOR VISIT: follow up ?HISTORY FROM: patient, son ?Primary Neurologist: Dr. Julius Bowels. [name] Dyer  ? ?Today 05/14/22 ?[name] Dyer today for follow-up.  Presented to the ED 04/22/22 with weakness and confusion.  UA notable for UTI and MRI showing subacute punctate infarct in the left cerebellum and scattered remote infarcts.  Felt subacute stroke was an incidental finding. On Xarelto.  ? 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 ?Here with her son, who is also my patient, he has Parkinson's disease. She now has an aide who helps with showers every other day. Denies any change to memory. Has to urinate often. Has CHF on Lasix. Her son manages medications. Eats well, sleeps well. Uses rollator. No recent falls. No changes to walking. No numbness or weakness. Physical therapy is coming to the home, seems to be helping. Poor vision. Working with PT on shuffling gait. Saw PCP for coughing with eating trying reflux medication. Decline in MMSE 13/30 today (23/30) ? ?Update 07/02/2021 SS: Sonam Wandel is an 88 year old female with history of memory disturbance. MMSE 23/30 today. Living alone, her sons come throughout the day. She is alone in mornings and night. Does fairly well. She forgets mainly at night, where she put something. She keeps up with medications, does well with this, son looks behind her, uses pillbox. Family brings meals in. Doesn't drive a car. Family helps with housework, she does laundry. Uses rollator. Currently taking Namenda 5 mg twice daily, Aricept 10 mg at bedtime. Sleeps fairly well, denies any dreams. Dizziness manageable with current dose of Namenda, also on Jardiance, which has same  effect. Takes trazodone as needed. Xarelto for history AFIB. Claims no major health issues. Has a list of medications she may want to try: Tofranil, Zyprexa. Does walk outside when weather allows. Here today with son, [name] Dyer.  ? ?HISTORY  ?01/30/2021 Dr. Jannifer Franklin: [name] is an 88 year old right-handed white female with a history of a slowly progressive memory disturbance that has been present for a number of years.  The patient was first seen through this office and 2017, she was having difficulty at that time with directions with driving.  She scored 28/30 on the Mini-Mental status examination.  The patient underwent MRI of the brain that showed fairly extensive small vessel disease and a chronic right occipital stroke event.  The patient is on chronic anticoagulation therapy.  She has been on Aricept taking 10 mg daily.  She does report some vivid dreams on the medication.  She recent was placed on Namenda, and could not tolerate the drug secondary to dizziness but she claims that she was started on 28 mg extended release medication daily.  She only took the medication for 4 days.  The patient currently lives at home, she is alone at this point as her husband has passed away.  She does not operate a motor vehicle.  She does not cook, her daughter-in-law makes meals for her and she will hit them up.  She takes pills from a pill dispenser and she writes the checks to pay the bills but requires some help with this.  She does require some help keeping up with appointments.  The patient has not had any  falls but she does use a walker outside of the house.  The patient denies issues controlling the bowels or the bladder.  She does have some discomfort in the legs associated with neuropathy, she takes gabapentin for this.  She claims that she does fairly well with the neuropathy pain. ? ?REVIEW OF SYSTEMS: Out of a complete 14 system review of symptoms, the patient complains only of the following symptoms, and all  other reviewed systems are negative. ? ?See HPI ? ?ALLERGIES: ?Allergies  ?Allergen Reactions  ? Ciprofloxacin Diarrhea  ? Sulfa Antibiotics Hives and Rash  ?  Severe Rash  ? Memantine Other (See Comments)  ?  Dizziness. Pt takes this medication at home  ? Succinylsulphathiazole Rash  ? Sulfamethoxazole Rash  ? ? ?HOME MEDICATIONS: ?Outpatient Medications Prior to Visit  ?Medication Sig Dispense Refill  ? acetaminophen (TYLENOL) 500 MG tablet Take 1,000 mg by mouth every 6 (six) hours as needed for mild pain or headache.     ? alendronate (FOSAMAX) 70 MG tablet Take 1 tablet (70 mg total) by mouth once a week. 12 tablet 1  ? allopurinol (ZYLOPRIM) 100 MG tablet Take 1 tablet (100 mg total) by mouth daily. 90 tablet 1  ? atenolol (TENORMIN) 50 MG tablet Take 1 tablet (50 mg total) by mouth 2 (two) times daily. 180 tablet 3  ? calcium carbonate (OS-CAL - DOSED IN MG OF ELEMENTAL CALCIUM) 1250 (500 Ca) MG tablet Take 1 tablet (1,250 mg total) by mouth in the morning. 30 tablet 3  ? cholecalciferol (VITAMIN D3) 25 MCG (1000 UT) tablet Take 2,000 Units by mouth daily.    ? colchicine 0.6 MG tablet Take 2 tablets by mouth x 1 dose then one tablet daily x 3 days wait 12 hours prior to resuming Allopurinol 30 tablet 0  ? donepezil (ARICEPT) 10 MG tablet Take 1 tablet (10 mg total) by mouth at bedtime. 90 tablet 3  ? furosemide (LASIX) 40 MG tablet TAKE 40 MG DAILY ALTERNATING EVERY OTHER DAY WITH 60 MG (1 AND 1/2 TABLET)DAILY 90 tablet 3  ? gabapentin (NEURONTIN) 300 MG capsule TAKE 1 CAPSULE BY MOUTH THREE TIMES A DAY 270 capsule 1  ? ipratropium (ATROVENT) 0.06 % nasal spray CAN USE 2 SPRAYS IN EACH NOSTRIL EVERY 6 HOURS IF NEEDED TO DRY UP RUNNY N0SE 15 mL 0  ? memantine (NAMENDA) 5 MG tablet Take 1 tablet (5 mg total) by mouth 2 (two) times daily. 180 tablet 3  ? Menthol, Topical Analgesic, (ICY HOT EX) Apply 1 application. topically 2 (two) times daily as needed (Foot pain).    ? metolazone (ZAROXOLYN) 2.5 MG tablet  TAKE ONE TABLET BY MOUTH TWICE WEEKLY - 30 MINUTES PRIOR TO TAKING YOUR 60MG DOSE OF FUROSEMIDE 8 tablet 10  ? Multiple Vitamins-Minerals (MULTIPLE VITAMINS/WOMENS PO) Take 1 tablet by mouth daily.    ? omeprazole (PRILOSEC) 20 MG capsule Take 1 capsule (20 mg total) by mouth daily. 30 capsule 3  ? potassium chloride SA (KLOR-CON M) 20 MEQ tablet Take 1 tablet (20 mEq total) by mouth daily. 90 tablet 3  ? Rivaroxaban (XARELTO) 15 MG TABS tablet TAKE 1 TABLET (15 MG TOTAL) BY MOUTH DAILY WITH SUPPER. 90 tablet 1  ? rosuvastatin (CRESTOR) 5 MG tablet Take 1 tablet (5 mg total) by mouth daily. 90 tablet 3  ? traZODone (DESYREL) 50 MG tablet Take 50 mg by mouth at bedtime as needed for sleep.    ? vitamin B-12 (CYANOCOBALAMIN) 1000  MCG tablet Take 1,000 mcg by mouth daily.    ? vitamin C (ASCORBIC ACID) 250 MG tablet Take 250 mg by mouth daily.    ? ?No facility-administered medications prior to visit.  ? ? ?PAST MEDICAL HISTORY: ?Past Medical History:  ?Diagnosis Date  ? Alzheimer disease ([place])   ? Anemia   ? Arrhythmia   ? Cataract   ? Colon polyps   ? Community acquired pneumonia 01/2012  ? LLL  ? Congestive heart failure ([place])   ? Hyperlipidemia   ? Hypertension   ? Memory difficulty 10/01/2016  ? Osteoporosis   ? Pleural effusion, right 01/2012  ? SVT (supraventricular tachycardia) ([place]) 01/2012  ? UTI (lower urinary tract infection)   ? ? ?PAST SURGICAL HISTORY: ?Past Surgical History:  ?Procedure Laterality Date  ? ABDOMINAL HYSTERECTOMY  1983  ? APPENDECTOMY  1983  ? COLONOSCOPY    ? COLONOSCOPY N/A 06/10/2016  ? Procedure: COLONOSCOPY;  Surgeon: Manus Gunning, MD;  Location: [facility];  Service: Gastroenterology;  Laterality: N/A;  ? ESOPHAGOGASTRODUODENOSCOPY N/A 06/10/2016  ? Procedure: ESOPHAGOGASTRODUODENOSCOPY (EGD);  Surgeon: Manus Gunning, MD;  Location: [facility];  Service: Gastroenterology;  Laterality: N/A;  ? HEMIARTHROPLASTY HIP  2011  ? Left femoral neck fracture  ? RIGHT/LEFT  HEART CATH AND CORONARY ANGIOGRAPHY N/A 01/08/2022  ? Procedure: RIGHT/LEFT HEART CATH AND CORONARY ANGIOGRAPHY;  Surgeon: Leonie Man, MD;  Location: [facility];  Service: Cardiovascular;  L

## 2022-05-14 ENCOUNTER — Ambulatory Visit (INDEPENDENT_AMBULATORY_CARE_PROVIDER_SITE_OTHER): Payer: Medicare Other | Admitting: Neurology

## 2022-05-14 ENCOUNTER — Encounter: Payer: Self-pay | Admitting: Neurology

## 2022-05-14 VITALS — BP 121/66 | HR 81 | Ht 64.0 in | Wt 126.5 lb

## 2022-05-14 DIAGNOSIS — G309 Alzheimer's disease, unspecified: Secondary | ICD-10-CM | POA: Diagnosis not present

## 2022-05-14 DIAGNOSIS — I639 Cerebral infarction, unspecified: Secondary | ICD-10-CM | POA: Diagnosis not present

## 2022-05-14 DIAGNOSIS — F0280 Dementia in other diseases classified elsewhere without behavioral disturbance: Secondary | ICD-10-CM | POA: Diagnosis not present

## 2022-05-15 ENCOUNTER — Other Ambulatory Visit: Payer: Self-pay

## 2022-05-15 DIAGNOSIS — E876 Hypokalemia: Secondary | ICD-10-CM

## 2022-05-18 ENCOUNTER — Emergency Department (HOSPITAL_COMMUNITY): Payer: Medicare Other

## 2022-05-18 ENCOUNTER — Other Ambulatory Visit: Payer: Self-pay

## 2022-05-18 ENCOUNTER — Encounter (HOSPITAL_COMMUNITY): Payer: Self-pay | Admitting: Emergency Medicine

## 2022-05-18 ENCOUNTER — Emergency Department (HOSPITAL_COMMUNITY)
Admission: EM | Admit: 2022-05-18 | Discharge: 2022-05-18 | Disposition: A | Discharge status: Home / Self Care | Payer: Medicare Other | Attending: Emergency Medicine | Admitting: Emergency Medicine

## 2022-05-18 DIAGNOSIS — E860 Dehydration: Secondary | ICD-10-CM | POA: Diagnosis not present

## 2022-05-18 DIAGNOSIS — E876 Hypokalemia: Secondary | ICD-10-CM | POA: Insufficient documentation

## 2022-05-18 DIAGNOSIS — Z7901 Long term (current) use of anticoagulants: Secondary | ICD-10-CM | POA: Diagnosis not present

## 2022-05-18 DIAGNOSIS — R55 Syncope and collapse: Secondary | ICD-10-CM | POA: Diagnosis present

## 2022-05-18 DIAGNOSIS — E871 Hypo-osmolality and hyponatremia: Secondary | ICD-10-CM | POA: Insufficient documentation

## 2022-05-18 DIAGNOSIS — R300 Dysuria: Secondary | ICD-10-CM | POA: Insufficient documentation

## 2022-05-18 DIAGNOSIS — F0390 Unspecified dementia without behavioral disturbance: Secondary | ICD-10-CM | POA: Insufficient documentation

## 2022-05-18 DIAGNOSIS — Z87440 Personal history of urinary (tract) infections: Secondary | ICD-10-CM | POA: Insufficient documentation

## 2022-05-18 LAB — COMPREHENSIVE METABOLIC PANEL
ALT: 17 U/L (ref 0–44)
AST: 33 U/L (ref 15–41)
Albumin: 3.4 g/dL — ABNORMAL LOW (ref 3.5–5.0)
Alkaline Phosphatase: 119 U/L (ref 38–126)
Anion gap: 13 (ref 5–15)
BUN: 55 mg/dL — ABNORMAL HIGH (ref 8–23)
CO2: 28 mmol/L (ref 22–32)
Calcium: 8.8 mg/dL — ABNORMAL LOW (ref 8.9–10.3)
Chloride: 85 mmol/L — ABNORMAL LOW (ref 98–111)
Creatinine, Ser: 1.73 mg/dL — ABNORMAL HIGH (ref 0.44–1.00)
GFR, Estimated: 28 mL/min — ABNORMAL LOW (ref >60)
Glucose, Bld: 102 mg/dL — ABNORMAL HIGH (ref 70–99)
Potassium: 3 mmol/L — ABNORMAL LOW (ref 3.5–5.1)
Sodium: 126 mmol/L — ABNORMAL LOW (ref 135–145)
Total Bilirubin: 1.4 mg/dL — ABNORMAL HIGH (ref 0.3–1.2)
Total Protein: 7.2 g/dL (ref 6.5–8.1)

## 2022-05-18 LAB — URINALYSIS, ROUTINE W REFLEX MICROSCOPIC
Bacteria, UA: NONE SEEN
Bilirubin Urine: NEGATIVE
Glucose, UA: NEGATIVE mg/dL
Hgb urine dipstick: NEGATIVE
Ketones, ur: NEGATIVE mg/dL
Nitrite: NEGATIVE
Protein, ur: NEGATIVE mg/dL
Specific Gravity, Urine: 1.006 (ref 1.005–1.030)
pH: 7 (ref 5.0–8.0)

## 2022-05-18 LAB — CBC WITH DIFFERENTIAL/PLATELET
Abs Immature Granulocytes: 0.06 10*3/uL (ref 0.00–0.07)
Basophils Absolute: 0 10*3/uL (ref 0.0–0.1)
Basophils Relative: 0 %
Eosinophils Absolute: 0.3 10*3/uL (ref 0.0–0.5)
Eosinophils Relative: 5 %
HCT: 35.6 % — ABNORMAL LOW (ref 36.0–46.0)
Hemoglobin: 11.4 g/dL — ABNORMAL LOW (ref 12.0–15.0)
Immature Granulocytes: 1 %
Lymphocytes Relative: 8 %
Lymphs Abs: 0.6 10*3/uL — ABNORMAL LOW (ref 0.7–4.0)
MCH: 29.2 pg (ref 26.0–34.0)
MCHC: 32 g/dL (ref 30.0–36.0)
MCV: 91 fL (ref 80.0–100.0)
Monocytes Absolute: 0.8 10*3/uL (ref 0.1–1.0)
Monocytes Relative: 11 %
Neutro Abs: 5.1 10*3/uL (ref 1.7–7.7)
Neutrophils Relative %: 75 %
Platelets: UNDETERMINED 10*3/uL (ref 150–400)
RBC: 3.91 MIL/uL (ref 3.87–5.11)
RDW: 19.3 % — ABNORMAL HIGH (ref 11.5–15.5)
WBC: 6.8 10*3/uL (ref 4.0–10.5)
nRBC: 0 % (ref 0.0–0.2)

## 2022-05-18 LAB — TROPONIN I (HIGH SENSITIVITY)
Troponin I (High Sensitivity): 17 ng/L (ref <18)
Troponin I (High Sensitivity): 12 ng/L (ref <18)

## 2022-05-18 LAB — MAGNESIUM: Magnesium: 2.3 mg/dL (ref 1.7–2.4)

## 2022-05-18 MED ORDER — POTASSIUM CHLORIDE CRYS ER 20 MEQ PO TBCR
40.0000 meq | EXTENDED_RELEASE_TABLET | Freq: Once | ORAL | Status: AC
  Administered 2022-05-18: 40 meq via ORAL
  Filled 2022-05-18: qty 2

## 2022-05-18 NOTE — ED Triage Notes (Signed)
Pt BIB EMS from home for a syncopal event with her son. States that the pt did not fall, son caught her during syncopal episode. States LOC last for approx. 2 minutes.

## 2022-05-18 NOTE — ED Notes (Signed)
Pt verbalized understanding of d/c instructions, meds, and followup care. Denies questions. VSS, no distress noted. Steady gait to exit with all belongings.  ?

## 2022-05-18 NOTE — ED Notes (Signed)
Patient transported to MRI 

## 2022-05-18 NOTE — Discharge Instructions (Addendum)
Your symptoms can be from low salt and low potassium levels.  Please continue taking your potassium as prescribed  You should increase your salt intake.  You need your chemistry checked including your sodium and potassium levels with your doctor in a week.    Please follow-up with your primary care doctor  Return to ER if you have another episode of passing out, dizziness, chest pain, trouble speaking

## 2022-05-18 NOTE — ED Provider Notes (Signed)
[facility] EMERGENCY DEPARTMENT Provider Note   CSN: [id number] Arrival date & time: 05/18/22  1422     History  Chief Complaint  Patient presents with   Loss of Consciousness    [name] Dyer is a 88 y.o. female hx of afib on xarelto, dementia, here presenting with loss of consciousness.  Patient is here with near syncope.  Patient cannot tell me much about what is going on.  Patient states that she felt lightheaded and dizzy.  Son was at the bedside and told me that they were coming back from church today.  She usually walks with a walker and just got in the house and  collapsed.  her son was She was lowered to the floor and there is no head injury.  Patient is now back to baseline.  Patient was recently admitted for ESBL UTI and gets frequent UTIs.   The history is provided by the patient and a relative.      Home Medications Prior to Admission medications   Medication Sig Start Date End Date Taking? Authorizing Provider  acetaminophen (TYLENOL) 500 MG tablet Take 1,000 mg by mouth every 6 (six) hours as needed for mild pain or headache.     [provider]  alendronate (FOSAMAX) 70 MG tablet Take 1 tablet (70 mg total) by mouth once a week. 01/20/22   Ngetich, [name] C, NP  allopurinol (ZYLOPRIM) 100 MG tablet Take 1 tablet (100 mg total) by mouth daily. 02/10/22   Ngetich, [name] C, NP  atenolol (TENORMIN) 50 MG tablet Take 1 tablet (50 mg total) by mouth 2 (two) times daily. 02/10/22   [name], MD  calcium carbonate (OS-CAL - DOSED IN MG OF ELEMENTAL CALCIUM) 1250 (500 Ca) MG tablet Take 1 tablet (1,250 mg total) by mouth in the morning. 01/18/22   Margie Billet, PA-C  cholecalciferol (VITAMIN D3) 25 MCG (1000 UT) tablet Take 2,000 Units by mouth daily.    [provider]  colchicine 0.6 MG tablet Take 2 tablets by mouth x 1 dose then one tablet daily x 3 days wait 12 hours prior to resuming Allopurinol 04/28/22   Ngetich, [name] C, NP   donepezil (ARICEPT) 10 MG tablet Take 1 tablet (10 mg total) by mouth at bedtime. 07/02/21   Suzzanne Cloud, NP  furosemide (LASIX) 40 MG tablet TAKE 40 MG DAILY ALTERNATING EVERY OTHER DAY WITH 60 MG (1 AND 1/2 TABLET)DAILY 02/10/22   [name], MD  gabapentin (NEURONTIN) 300 MG capsule TAKE 1 CAPSULE BY MOUTH THREE TIMES A DAY 02/24/22   Ngetich, [name] C, NP  ipratropium (ATROVENT) 0.06 % nasal spray CAN USE 2 SPRAYS IN EACH NOSTRIL EVERY 6 HOURS IF NEEDED TO DRY UP RUNNY N0SE 12/04/20   Kozlow, Donnamarie Poag, MD  memantine (NAMENDA) 5 MG tablet Take 1 tablet (5 mg total) by mouth 2 (two) times daily. 07/02/21   Suzzanne Cloud, NP  Menthol, Topical Analgesic, (ICY HOT EX) Apply 1 application. topically 2 (two) times daily as needed (Foot pain).    [provider]  metolazone (ZAROXOLYN) 2.5 MG tablet TAKE ONE TABLET BY MOUTH TWICE WEEKLY - 30 MINUTES PRIOR TO TAKING YOUR 60MG DOSE OF FUROSEMIDE 04/10/22   [name], MD  Multiple Vitamins-Minerals (MULTIPLE VITAMINS/WOMENS PO) Take 1 tablet by mouth daily.    [provider]  omeprazole (PRILOSEC) 20 MG capsule Take 1 capsule (20 mg total) by mouth daily. 05/09/22   Ngetich,  [name] C, NP  potassium chloride SA (KLOR-CON M) 20 MEQ tablet Take 1 tablet (20 mEq total) by mouth daily. 02/10/22   [name], MD  Rivaroxaban (XARELTO) 15 MG TABS tablet TAKE 1 TABLET (15 MG TOTAL) BY MOUTH DAILY WITH SUPPER. 01/08/22   [name], MD  rosuvastatin (CRESTOR) 5 MG tablet Take 1 tablet (5 mg total) by mouth daily. 02/10/22   [name], MD  traZODone (DESYREL) 50 MG tablet Take 50 mg by mouth at bedtime as needed for sleep.    [provider]  vitamin B-12 (CYANOCOBALAMIN) 1000 MCG tablet Take 1,000 mcg by mouth daily.    [provider]  vitamin C (ASCORBIC ACID) 250 MG tablet Take 250 mg by mouth daily.    [provider]      Allergies    Ciprofloxacin, Sulfa antibiotics, Memantine, Succinylsulphathiazole, and  Sulfamethoxazole    Review of Systems   Review of Systems  Neurological:  Positive for dizziness.  All other systems reviewed and are negative.  Physical Exam Updated Vital Signs BP 122/85   Pulse 88   Temp 97.9 F (36.6 C) (Oral)   Resp 20   Ht _0  (1.626 m)   Wt 57.4 kg   SpO2 98%   BMI 21.71 kg/m  Physical Exam Vitals and nursing note reviewed.  Constitutional:      Comments: Slightly dehydrated  HENT:     Head: Normocephalic.     Nose: Nose normal.     Mouth/Throat:     Mouth: Mucous membranes are dry.  Eyes:     Extraocular Movements: Extraocular movements intact.     Pupils: Pupils are equal, round, and reactive to light.  Cardiovascular:     Rate and Rhythm: Normal rate and regular rhythm.     Pulses: Normal pulses.     Heart sounds: Normal heart sounds.  Pulmonary:     Effort: Pulmonary effort is normal.     Breath sounds: Normal breath sounds.  Abdominal:     General: Abdomen is flat.     Palpations: Abdomen is soft.  Musculoskeletal:        General: Normal range of motion.     Cervical back: Normal range of motion and neck supple.  Skin:    General: Skin is warm.     Capillary Refill: Capillary refill takes less than 2 seconds.  Neurological:     Comments: Demented and patient is confused.  Patient is moving all extremities.  No obvious eye deviation.  Psychiatric:        Mood and Affect: Mood normal.        Behavior: Behavior normal.    ED Results / Procedures / Treatments   Labs (all labs ordered are listed, but only abnormal results are displayed) Labs Reviewed  COMPREHENSIVE METABOLIC PANEL  CBC WITH DIFFERENTIAL/PLATELET  MAGNESIUM  URINALYSIS, ROUTINE W REFLEX MICROSCOPIC  TROPONIN I (HIGH SENSITIVITY)    EKG None  Radiology No results found.  Procedures Procedures    Medications Ordered in ED Medications - No data to display  ED Course/ Medical Decision Making/ A&P                           Medical Decision  Making [name] Dyer is a 88 y.o. female here presenting with near syncope.  Patient cannot tell me exactly what happened.  She apparently had a near syncopal episode that was witnessed  by her son.  Patient is demented and unable to give me much history.  Consider electrolyte abnormality versus dehydration versus orthostasis versus cardiac event versus head bleed.  Plan to get CBC and CMP and troponin and CT head and urinalysis and chest x-ray.  Will hydrate patient and get orthostatics and reassess  6:47 PM Son is at bedside and we had extensive discussion.  I was able to reviewed her labs and her imaging.  Her labs showed sodium of 126 and potassium of 3.0.  Sodium is slightly lower than usual and potassium is chronic.  Her magnesium level is normal.  [name] creatinine is 1.7 which is baseline.  Her UA showed no obvious UTI.  Her CT head did not show any obvious bleeding but due to her history of stroke in the past, I ordered MRI and it did not show any acute infarct.  Patient is back to baseline now.  I am not clear if she had a near syncopal episode from dementia or if she has a vasovagal syncope or this is from her mild hyponatremia.  We had extensive discussion about observation in the hospital versus close follow-up with PCP and patient and son wants to go home.  She is on low-salt diet and I told her to add slightly more salt to her diet and she already has prescription for potassium at home.  I told her that she will need repeat chemistry with her doctor in the office in a week  Problems Addressed: Hypokalemia: acute illness or injury Hyponatremia: acute illness or injury Near syncope: acute illness or injury  Amount and/or Complexity of Data Reviewed Labs: ordered. Decision-making details documented in ED Course. Radiology: ordered and independent interpretation performed. Decision-making details documented in ED Course. ECG/medicine tests: ordered and independent interpretation  performed. Decision-making details documented in ED Course.  Risk Prescription drug management.    Final Clinical Impression(s) / ED Diagnoses Final diagnoses:  None    Rx / DC Orders ED Discharge Orders     None         Drenda Freeze, MD 05/18/22 1850

## 2022-05-20 LAB — URINE CULTURE: Culture: >=100000 — AB

## 2022-05-21 ENCOUNTER — Telehealth: Payer: Self-pay

## 2022-05-21 NOTE — Telephone Encounter (Signed)
Post ED Visit - Positive Culture Follow-up  Culture report reviewed by antimicrobial stewardship pharmacist: [place] Team _0  Joseph Art, Pharm.D. _1  Heide Guile, Pharm.D., BCPS AQ-ID _2  Parks Neptune, Pharm.D., BCPS _3  Alycia Rossetti, Pharm.D., BCPS _4  [place], [place].D., BCPS, AAHIVP _5  [name], Pharm.D., BCPS, AAHIVP _6  Salome Arnt, PharmD, BCPS _7  Johnnette Gourd, PharmD, BCPS _8  Hughes Better, PharmD, BCPS _9  Leeroy Cha, PharmD _10  Laqueta Linden, PharmD, BCPS _11  Albertina Parr, PharmD  [place] Team _12  Leodis Sias, PharmD _13  Lindell Spar, PharmD _14  Royetta Asal, PharmD _15  Graylin Shiver, Rph _16  Rema Fendt) Glennon Mac, PharmD _17  Arlyn Dunning, PharmD _18  Netta Cedars, PharmD _19  Dia Sitter, PharmD _20  Leone [place], PharmD _21  Gretta Arab, PharmD _22  Theodis Shove, PharmD _23  Peggyann Juba, PharmD _24  Reuel Boom, PharmD   Positive urine culture Not treated, asymptomatic bacterium no antibiotics indicated and no further patient follow-up is required at this time.  Glennon Hamilton 05/21/2022, 12:46 PM

## 2022-05-21 NOTE — Progress Notes (Signed)
ED Antimicrobial Stewardship Positive Culture Follow Up   [name] Dyer is an 88 y.o. female who presented to [facility] on 05/18/2022 with a chief complaint of  Chief Complaint  Patient presents with   Loss of Consciousness    Recent Results (from the past 720 hour(s))  Urine Culture     Status: Abnormal   Collection Time: 04/22/22  3:51 AM   Specimen: Urine, Clean Catch  Result Value Ref Range Status   Specimen Description URINE, CLEAN CATCH  Final   Special Requests NONE  Final   Culture (A)  Final    >=100,000 COLONIES/mL KLEBSIELLA PNEUMONIAE Confirmed Extended Spectrum Beta-Lactamase Producer (ESBL).  In bloodstream infections from ESBL organisms, carbapenems are preferred over piperacillin/tazobactam. They are shown to have a lower risk of mortality. 80,000 COLONIES/mL VIRIDANS STREPTOCOCCUS Standardized susceptibility testing for this organism is not available. Performed at [facility], [place] [address] [place], [place] [address]    Report Status 04/24/2022 FINAL  Final   Organism ID, Bacteria KLEBSIELLA PNEUMONIAE (A)  Final      Susceptibility   Klebsiella pneumoniae - MIC*    AMPICILLIN >=32 RESISTANT Resistant     CEFAZOLIN >=64 RESISTANT Resistant     CEFEPIME 8 INTERMEDIATE Intermediate     CEFTRIAXONE >=64 RESISTANT Resistant     CIPROFLOXACIN >=4 RESISTANT Resistant     GENTAMICIN <=1 SENSITIVE Sensitive     IMIPENEM <=0.25 SENSITIVE Sensitive     NITROFURANTOIN 64 INTERMEDIATE Intermediate     TRIMETH/SULFA >=320 RESISTANT Resistant     AMPICILLIN/SULBACTAM >=32 RESISTANT Resistant     PIP/TAZO 32 INTERMEDIATE Intermediate     * >=100,000 COLONIES/mL KLEBSIELLA PNEUMONIAE  Urine Culture     Status: Abnormal   Collection Time: 05/18/22  3:47 PM   Specimen: Urine, Clean Catch  Result Value Ref Range Status   Specimen Description URINE, CLEAN CATCH  Final   Special Requests   Final    NONE Performed at [facility], [place] [address]  [place], [place] [address]    Culture >=100,000 COLONIES/mL ENTEROCOCCUS FAECIUM (A)  Final   Report Status 05/20/2022 FINAL  Final   Organism ID, Bacteria ENTEROCOCCUS FAECIUM (A)  Final      Susceptibility   Enterococcus faecium - MIC*    AMPICILLIN <=2 SENSITIVE Sensitive     NITROFURANTOIN 64 INTERMEDIATE Intermediate     VANCOMYCIN <=0.5 SENSITIVE Sensitive     * >=100,000 COLONIES/mL ENTEROCOCCUS FAECIUM    Likely asymptomatic bacteruria. No antibiotics indicated.  ED Provider: Caryl Ada, PA-C   [name] 05/21/2022, 9:10 AM Clinical Pharmacist Monday - Friday phone -  [phone] Saturday - Sunday phone - [phone]

## 2022-05-22 ENCOUNTER — Telehealth: Payer: Self-pay

## 2022-05-22 ENCOUNTER — Other Ambulatory Visit: Payer: Medicare Other

## 2022-05-22 DIAGNOSIS — E876 Hypokalemia: Secondary | ICD-10-CM

## 2022-05-22 NOTE — Telephone Encounter (Signed)
Recommend ED evaluation if patient is having episodes of collapsing.

## 2022-05-22 NOTE — Telephone Encounter (Signed)
[name] Dyer the son states they have been giving her Gatorade for sodium. She had two episodes (collapsed/no fall) yesterday and today episode. Unable to attend blood draw today, it was canceled. She slid down/collapsed on her walker today. Should she come in for office visit or blood work.  Scheduled patient for tomorrow at 2:20 as requested by son.

## 2022-05-23 ENCOUNTER — Encounter: Payer: Self-pay | Admitting: Family

## 2022-05-23 ENCOUNTER — Ambulatory Visit (INDEPENDENT_AMBULATORY_CARE_PROVIDER_SITE_OTHER): Payer: Medicare Other | Admitting: Family

## 2022-05-23 VITALS — BP 108/78 | HR 52 | Temp 97.3°F | Resp 16 | Ht 64.0 in

## 2022-05-23 DIAGNOSIS — R55 Syncope and collapse: Secondary | ICD-10-CM

## 2022-05-23 DIAGNOSIS — E871 Hypo-osmolality and hyponatremia: Secondary | ICD-10-CM

## 2022-05-23 DIAGNOSIS — E876 Hypokalemia: Secondary | ICD-10-CM

## 2022-05-23 DIAGNOSIS — R82998 Other abnormal findings in urine: Secondary | ICD-10-CM

## 2022-05-23 DIAGNOSIS — I4821 Permanent atrial fibrillation: Secondary | ICD-10-CM

## 2022-05-23 LAB — POCT URINALYSIS DIPSTICK
Bilirubin, UA: NEGATIVE
Glucose, UA: NEGATIVE
Ketones, UA: POSITIVE
Nitrite, UA: POSITIVE
Protein, UA: NEGATIVE
Spec Grav, UA: 1.01 (ref 1.010–1.025)
Urobilinogen, UA: NEGATIVE E.U./dL — AB
pH, UA: 6 (ref 5.0–8.0)

## 2022-05-23 MED ORDER — ATENOLOL 50 MG PO TABS: 50.0000 mg | ORAL_TABLET | Freq: Every day | ORAL | 3 refills | Status: AC

## 2022-05-23 NOTE — Patient Instructions (Addendum)
cleanse right hand skin tear with saline ,pat dry, triple antibiotic ointment applied and covered with foam dressing for extra protection and absorption.change dressing every other day.  - decrease Atenolol from 50 mg tablet twice daily to 50 mg tablet daily

## 2022-05-23 NOTE — Progress Notes (Signed)
Provider: Marlowe Sax FNP-C  Emersyn Kotarski, [name], NP  Patient Care Team: Joie Reamer, [name], NP as PCP - General (Family Medicine) [name], MD as PCP - Cardiology (Cardiology) [name], MD as Consulting Physician (Allergy and Immunology) [name], DPM as Consulting Physician (Podiatry) [name], MD as Consulting Physician (Obstetrics and Gynecology) [name], MD (Inactive) (Neurology) [name], MD as Consulting Physician (Urology)  Extended Emergency Contact Information Primary Emergency Contact: [place] Phone: [phone] Mobile Phone: [phone] Relation: Son Secondary Emergency Contact: Honor Jr,[name]  [place] of [place] Phone: [phone] Mobile Phone: [phone] Relation: Son  Code Status:  DNR Goals of care: Advanced Directive information    05/23/2022    2:58 PM  Advanced Directives  Does Patient Have a Medical Advance Directive? Yes  Type of Paramedic of [place];Living will  Does patient want to make changes to medical advance directive? No - Patient declined  Copy of [place] in Chart? Yes - validated most recent copy scanned in chart (See row information)     Chief Complaint  Patient presents with  . Hospitalization Follow-up    Hospital follow up 05/18/2022 for Hyponatremia, and Hypokalemia.  . Concern     High Fall Risk    HPI:  Pt is a 88 y.o. female seen today for an acute visit for hospital follow up 05/18/2022 for hypokalemia and hyponatremia.    Past Medical History:  Diagnosis Date  . Alzheimer disease ([place])   . Anemia   . Arrhythmia   . Cataract   . Colon polyps   . Community acquired pneumonia 01/2012   LLL  . Congestive heart failure ([place])   . Hyperlipidemia   . Hypertension   . Memory difficulty 10/01/2016  . Osteoporosis   . Pleural effusion, right 01/2012  . SVT (supraventricular  tachycardia) ([place]) 01/2012  . UTI (lower urinary tract infection)    Past Surgical History:  Procedure Laterality Date  . ABDOMINAL HYSTERECTOMY  1983  . APPENDECTOMY  1983  . COLONOSCOPY    . COLONOSCOPY N/A 06/10/2016   Procedure: COLONOSCOPY;  Surgeon: Manus Gunning, MD;  Location: [facility];  Service: Gastroenterology;  Laterality: N/A;  . ESOPHAGOGASTRODUODENOSCOPY N/A 06/10/2016   Procedure: ESOPHAGOGASTRODUODENOSCOPY (EGD);  Surgeon: Manus Gunning, MD;  Location: [facility];  Service: Gastroenterology;  Laterality: N/A;  . HEMIARTHROPLASTY HIP  2011   Left femoral neck fracture  . RIGHT/LEFT HEART CATH AND CORONARY ANGIOGRAPHY N/A 01/08/2022   Procedure: RIGHT/LEFT HEART CATH AND CORONARY ANGIOGRAPHY;  Surgeon: Leonie Man, MD;  Location: [facility];  Service: Cardiovascular;  Laterality: N/A;  . UPPER GASTROINTESTINAL ENDOSCOPY      Allergies  Allergen Reactions  . Ciprofloxacin Diarrhea  . Sulfa Antibiotics Hives and Rash    Severe Rash  . Memantine Other (See Comments)    Dizziness. Pt takes this medication at home  . Succinylsulphathiazole Rash  . Sulfamethoxazole Rash    Outpatient Encounter Medications as of 05/23/2022  Medication Sig  . acetaminophen (TYLENOL) 500 MG tablet Take 1,000 mg by mouth every 6 (six) hours as needed for mild pain or headache.   . alendronate (FOSAMAX) 70 MG tablet Take 1 tablet (70 mg total) by mouth once a week.  Marland Kitchen allopurinol (ZYLOPRIM) 100 MG tablet Take 1 tablet (100 mg total) by mouth daily.  Marland Kitchen atenolol (TENORMIN) 50 MG tablet Take 1 tablet (50 mg total)  by mouth 2 (two) times daily.  . calcium carbonate (OS-CAL - DOSED IN MG OF ELEMENTAL CALCIUM) 1250 (500 Ca) MG tablet Take 1 tablet (1,250 mg total) by mouth in the morning.  . cholecalciferol (VITAMIN D3) 25 MCG (1000 UT) tablet Take 2,000 Units by mouth daily.  . colchicine 0.6 MG tablet Take 2 tablets by mouth x 1 dose then one tablet daily x 3  days wait 12 hours prior to resuming Allopurinol  . donepezil (ARICEPT) 10 MG tablet Take 1 tablet (10 mg total) by mouth at bedtime.  . furosemide (LASIX) 40 MG tablet TAKE 40 MG DAILY ALTERNATING EVERY OTHER DAY WITH 60 MG (1 AND 1/2 TABLET)DAILY  . gabapentin (NEURONTIN) 300 MG capsule TAKE 1 CAPSULE BY MOUTH THREE TIMES A DAY  . ipratropium (ATROVENT) 0.06 % nasal spray CAN USE 2 SPRAYS IN EACH NOSTRIL EVERY 6 HOURS IF NEEDED TO DRY UP RUNNY N0SE  . memantine (NAMENDA) 5 MG tablet Take 1 tablet (5 mg total) by mouth 2 (two) times daily.  . Menthol, Topical Analgesic, (ICY HOT EX) Apply 1 application. topically 2 (two) times daily as needed (Foot pain).  Marland Kitchen metolazone (ZAROXOLYN) 2.5 MG tablet TAKE ONE TABLET BY MOUTH TWICE WEEKLY - 30 MINUTES PRIOR TO TAKING YOUR 60MG DOSE OF FUROSEMIDE  . Multiple Vitamins-Minerals (MULTIPLE VITAMINS/WOMENS PO) Take 1 tablet by mouth daily.  Marland Kitchen omeprazole (PRILOSEC) 20 MG capsule Take 1 capsule (20 mg total) by mouth daily.  . potassium chloride SA (KLOR-CON M) 20 MEQ tablet Take 1 tablet (20 mEq total) by mouth daily.  . Rivaroxaban (XARELTO) 15 MG TABS tablet TAKE 1 TABLET (15 MG TOTAL) BY MOUTH DAILY WITH SUPPER.  . rosuvastatin (CRESTOR) 5 MG tablet Take 1 tablet (5 mg total) by mouth daily.  . traZODone (DESYREL) 50 MG tablet Take 50 mg by mouth at bedtime as needed for sleep.  . vitamin B-12 (CYANOCOBALAMIN) 1000 MCG tablet Take 1,000 mcg by mouth daily.  . vitamin C (ASCORBIC ACID) 250 MG tablet Take 250 mg by mouth daily.   No facility-administered encounter medications on file as of 05/23/2022.    Review of Systems  Immunization History  Administered Date(s) Administered  . Hepatitis A 09/10/1999  . Influenza, High Dose Seasonal PF 11/19/2018  . Influenza,inj,Quad PF,6+ Mos 11/17/2018  . Influenza,inj,quad, With Preservative 10/29/2017  . Influenza-Unspecified 10/18/2020, 10/29/2021  . Zoster Recombinat (Shingrix) 09/08/2017   Pertinent   Health Maintenance Due  Topic Date Due  . DEXA SCAN  Never done  . INFLUENZA VACCINE  07/29/2022      04/27/2022    8:00 AM 05/06/2022    3:44 PM 05/09/2022    2:21 PM 05/18/2022    2:39 PM 05/23/2022    2:58 PM  Fall Risk  Falls in the past year?  0 0    Was there an injury with Fall?  0 0  1  Fall Risk Category Calculator  0 0    Fall Risk Category  Low Low    Patient Fall Risk Level High fall risk Low fall risk Low fall risk High fall risk High fall risk  Patient at Risk for Falls Due to  No Fall Risks No Fall Risks  History of fall(s);Impaired balance/gait;Impaired mobility  Fall risk Follow up  Falls evaluation completed Falls evaluation completed  Falls evaluation completed;Education provided;Falls prevention discussed   Functional Status Survey:    Vitals:   05/23/22 1457  BP: 108/78  Pulse: (!) 52  Resp:  16  Temp: (!) 97.3 F (36.3 C)  SpO2: 97%  Height: _0  (1.626 m)   Body mass index is 21.71 kg/m. Physical Exam  Labs reviewed: Recent Labs    04/23/22 0301 04/23/22 1305 04/25/22 2101 04/26/22 0242 05/09/22 1459 05/18/22 1451  NA 137   < > 133* 135 133* 126*  K 2.4*   < > 4.6 3.7 3.0* 3.0*  CL 103   < > 104 105 89* 85*  CO2 25   < > 21* 23 33* 28  GLUCOSE 112*   < > 139* 120* 103 102*  BUN 64*   < > 39* 37* 45* 55*  CREATININE 1.78*   < > 1.54* 1.42* 1.73* 1.73*  CALCIUM 8.3*   < > 8.4* 8.4* 10.0 8.8*  MG 2.4  --  2.3  --   --  2.3   < > = values in this interval not displayed.   Recent Labs    04/21/22 1746 04/23/22 0301 05/18/22 1451  AST 48* 45* 33  ALT 38 31 17  ALKPHOS 148* 114 119  BILITOT 1.5* 1.0 1.4*  PROT 7.7 6.0* 7.2  ALBUMIN 3.9 2.8* 3.4*   Recent Labs    04/16/22 1529 04/21/22 1746 04/23/22 0301 05/18/22 1451  WBC 8.2 9.8 6.1 6.8  NEUTROABS 6,109 8.0*  --  5.1  HGB 12.3 12.7 11.3* 11.4*  HCT 39.2 40.5 34.6* 35.6*  MCV 93.1 92.0 88.9 91.0  PLT 206 PLATELET CLUMPS NOTED ON SMEAR, UNABLE TO ESTIMATE PLATELET CLUMPS NOTED  ON SMEAR, UNABLE TO ESTIMATE PLATELET CLUMPS NOTED ON SMEAR, UNABLE TO ESTIMATE   Lab Results  Component Value Date   TSH 3.04 02/20/2022   Lab Results  Component Value Date   HGBA1C 5.9 (H) 04/22/2022   Lab Results  Component Value Date   CHOL 112 04/22/2022   HDL 30 (L) 04/22/2022   LDLCALC 66 04/22/2022   TRIG 79 04/22/2022   CHOLHDL 3.7 04/22/2022    Significant Diagnostic Results in last 30 days:  CT HEAD WO CONTRAST (5MM)  Result Date: 05/18/2022 CLINICAL DATA:  Mental status change. EXAM: CT HEAD WITHOUT CONTRAST TECHNIQUE: Contiguous axial images were obtained from the base of the skull through the vertex without intravenous contrast. RADIATION DOSE REDUCTION: This exam was performed according to the departmental dose-optimization program which includes automated exposure control, adjustment of the mA and/or kV according to patient size and/or use of iterative reconstruction technique. COMPARISON:  MRI head 04/22/2022 and 04/21/2022. FINDINGS: Brain: There is no evidence for acute intracranial hemorrhage or extra-axial fluid collection. There is no mass effect or midline shift. Old infarcts are seen in the left parietal region and right occipital region similar to prior MRI. There is mild diffuse atrophy with compensatory dilatation of the ventricular system, unchanged. There is also small infarct in the left cerebellum, unchanged. Vascular: Atherosclerotic calcifications are present within the cavernous internal carotid arteries. Skull: Normal. Negative for fracture or focal lesion. Sinuses/Orbits: No acute finding. Other: None. IMPRESSION: 1. No acute intracranial process. 2. 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 3. Extensive periventricular white matter hypodensities likely chronic small vessel ischemic change. If there is high clinical concern for acute infarct, consider further evaluation with MRI. No acute intracranial  abnormality. Electronically Signed   By: Ronney Asters M.D.   On: 05/18/2022 15:52   MR BRAIN WO CONTRAST  Result Date: 05/18/2022 CLINICAL DATA:  Transient ischemic attack (TIA) Neuro  deficit, acute, stroke suspected EXAM: MRI HEAD WITHOUT CONTRAST TECHNIQUE: Multiplanar, multiecho pulse sequences of the brain and surrounding structures were obtained without intravenous contrast. COMPARISON:  04/22/2022 FINDINGS: Brain: There is no acute infarction. Prominence of the ventricles and sulci reflects stable parenchymal volume loss. Confluent areas of T2 hyperintensity in the supratentorial white matter are nonspecific but may reflect advanced chronic microvascular ischemic changes. Chronic bilateral frontoparietal, occipital, and cerebellar infarcts are again noted. Minimal punctate foci of susceptibility are again identified and likely reflect chronic microhemorrhages. There is no intracranial mass, mass effect, or edema. There is no hydrocephalus or extra-axial fluid collection. Vascular: Major vessel flow voids at the skull base are preserved. Skull and upper cervical spine: Normal marrow signal is preserved. Sinuses/Orbits: Paranasal sinuses are aerated. Bilateral lens replacements. Other: Sella is unremarkable.  Mastoid air cells are clear. IMPRESSION: No acute infarction or significant change since prior study. Advanced chronic microvascular ischemic changes with superimposed chronic infarcts. Electronically Signed   By: Macy Mis M.D.   On: 05/18/2022 17:45   DG Chest Port 1 View  Result Date: 05/18/2022 CLINICAL DATA:  Syncopal episode today.  Altered mental status. EXAM: PORTABLE CHEST 1 VIEW COMPARISON:  04/25/2022 FINDINGS: Stable mild cardiomegaly. Both lungs are clear. The visualized skeletal structures are unremarkable. IMPRESSION: Stable mild cardiomegaly. No active lung disease. Electronically Signed   By: Marlaine Hind M.D.   On: 05/18/2022 16:06   DG CHEST PORT 1 VIEW  Result Date:  04/25/2022 CLINICAL DATA:  Tachypnea. EXAM: PORTABLE CHEST 1 VIEW COMPARISON:  April 21, 2022 FINDINGS: The cardiac silhouette is mildly enlarged and unchanged in size. Both lungs are clear. Multilevel degenerative changes seen throughout the thoracic spine. IMPRESSION: No active cardiopulmonary disease. Electronically Signed   By: Virgina Norfolk M.D.   On: 04/25/2022 21:23    Assessment/Plan 1. Syncope, unspecified syncope type *** - CBC with Differential/Platelets - CMP with eGFR([facility]) - EKG 12-Lead - POC Urinalysis Dipstick    Family/ staff Communication: Reviewed plan of care with patient  Labs/tests ordered: None   Next Appointment:   [name], NP

## 2022-05-23 NOTE — Telephone Encounter (Signed)
Called and discussed with [name]. He is hesitant to take patient to the ED. Advised him [name] definitely can order some labs but patient may need ED evaluation again.

## 2022-05-24 LAB — COMPLETE METABOLIC PANEL WITH GFR
AG Ratio: 1.5 (calc) (ref 1.0–2.5)
ALT: 16 U/L (ref 6–29)
AST: 23 U/L (ref 10–35)
Albumin: 4.1 g/dL (ref 3.6–5.1)
Alkaline phosphatase (APISO): 127 U/L (ref 37–153)
BUN/Creatinine Ratio: 33 (calc) — ABNORMAL HIGH (ref 6–22)
BUN: 50 mg/dL — ABNORMAL HIGH (ref 7–25)
CO2: 31 mmol/L (ref 20–32)
Calcium: 9.4 mg/dL (ref 8.6–10.4)
Chloride: 81 mmol/L — ABNORMAL LOW (ref 98–110)
Creat: 1.5 mg/dL — ABNORMAL HIGH (ref 0.60–0.95)
Globulin: 2.8 g/dL (calc) (ref 1.9–3.7)
Glucose, Bld: 88 mg/dL (ref 65–139)
Potassium: 2.8 mmol/L — ABNORMAL LOW (ref 3.5–5.3)
Sodium: 127 mmol/L — ABNORMAL LOW (ref 135–146)
Total Bilirubin: 1 mg/dL (ref 0.2–1.2)
Total Protein: 6.9 g/dL (ref 6.1–8.1)
eGFR: 33 mL/min/{1.73_m2} — ABNORMAL LOW (ref >=60)

## 2022-05-24 LAB — CBC WITH DIFFERENTIAL/PLATELET
Absolute Monocytes: 836 cells/uL (ref 200–950)
Basophils Absolute: 52 cells/uL (ref 0–200)
Basophils Relative: 0.7 %
Eosinophils Absolute: 259 cells/uL (ref 15–500)
Eosinophils Relative: 3.5 %
HCT: 33 % — ABNORMAL LOW (ref 35.0–45.0)
Hemoglobin: 10.8 g/dL — ABNORMAL LOW (ref 11.7–15.5)
Lymphs Abs: 548 cells/uL — ABNORMAL LOW (ref 850–3900)
MCH: 29 pg (ref 27.0–33.0)
MCHC: 32.7 g/dL (ref 32.0–36.0)
MCV: 88.7 fL (ref 80.0–100.0)
MPV: 12.4 fL (ref 7.5–12.5)
Monocytes Relative: 11.3 %
Neutro Abs: 5705 cells/uL (ref 1500–7800)
Neutrophils Relative %: 77.1 %
Platelets: 108 10*3/uL — ABNORMAL LOW (ref 140–400)
RBC: 3.72 10*6/uL — ABNORMAL LOW (ref 3.80–5.10)
RDW: 17.5 % — ABNORMAL HIGH (ref 11.0–15.0)
Total Lymphocyte: 7.4 %
WBC: 7.4 10*3/uL (ref 3.8–10.8)

## 2022-05-25 LAB — URINE CULTURE
MICRO NUMBER:: 13451629
SPECIMEN QUALITY:: ADEQUATE

## 2022-05-28 ENCOUNTER — Telehealth: Payer: Self-pay | Admitting: *Deleted

## 2022-05-28 ENCOUNTER — Other Ambulatory Visit: Payer: Self-pay

## 2022-05-28 DIAGNOSIS — E871 Hypo-osmolality and hyponatremia: Secondary | ICD-10-CM

## 2022-05-28 DIAGNOSIS — D649 Anemia, unspecified: Secondary | ICD-10-CM

## 2022-05-28 DIAGNOSIS — N3000 Acute cystitis without hematuria: Secondary | ICD-10-CM

## 2022-05-28 DIAGNOSIS — E876 Hypokalemia: Secondary | ICD-10-CM

## 2022-05-28 MED ORDER — NITROFURANTOIN MACROCRYSTAL 100 MG PO CAPS: 100.0000 mg | ORAL_CAPSULE | Freq: Two times a day (BID) | ORAL | 0 refills | Status: DC

## 2022-05-28 MED ORDER — SACCHAROMYCES BOULARDII 250 MG PO CAPS: 250.0000 mg | ORAL_CAPSULE | Freq: Two times a day (BID) | ORAL | 0 refills | Status: AC

## 2022-05-28 MED ORDER — POTASSIUM CHLORIDE CRYS ER 20 MEQ PO TBCR: 40.0000 meq | EXTENDED_RELEASE_TABLET | Freq: Every day | ORAL | 0 refills | Status: DC

## 2022-05-28 NOTE — Telephone Encounter (Signed)
Patient son called upset because the antibiotic for patient's UTI was not sent to the pharmacy.    - Final urine culture indicates greater than 100,000 Enterococcus Faecium start on nitrofurantoin 100 mg capsule 1 by mouth twice daily for 7 days.Take along with probiotic Florastor 250 mg capsule twice daily for 10 days.  -Potassium level is lower than previous increase potassium chloride from 20 mEq to take 2 tablet( Total 40 meq) by mouth daily.  Take extra tablet today.     Rx's pended and sent to [facility] for approval due to [place].

## 2022-05-28 NOTE — Telephone Encounter (Signed)
Orders were placed for iFOBT kit and BMP.  iFOBT kit was placed at the front desk for pick-up.

## 2022-05-28 NOTE — Telephone Encounter (Signed)
Script signed.

## 2022-05-29 ENCOUNTER — Emergency Department (HOSPITAL_COMMUNITY): Payer: Medicare Other

## 2022-05-29 ENCOUNTER — Telehealth: Payer: Self-pay | Admitting: *Deleted

## 2022-05-29 ENCOUNTER — Ambulatory Visit: Payer: Medicare Other | Admitting: Nurse Practitioner

## 2022-05-29 ENCOUNTER — Other Ambulatory Visit: Payer: Self-pay

## 2022-05-29 ENCOUNTER — Inpatient Hospital Stay (HOSPITAL_COMMUNITY)
Admission: EM | Admit: 2022-05-29 | Discharge: 2022-06-09 | DRG: 071 | Disposition: A | Discharge status: Skilled Nursing Facility | Payer: Medicare Other | Attending: Family Medicine | Admitting: Family Medicine

## 2022-05-29 ENCOUNTER — Encounter (HOSPITAL_COMMUNITY): Payer: Self-pay | Admitting: Emergency Medicine

## 2022-05-29 DIAGNOSIS — E876 Hypokalemia: Secondary | ICD-10-CM | POA: Diagnosis not present

## 2022-05-29 DIAGNOSIS — I130 Hypertensive heart and chronic kidney disease with heart failure and stage 1 through stage 4 chronic kidney disease, or unspecified chronic kidney disease: Secondary | ICD-10-CM | POA: Diagnosis present

## 2022-05-29 DIAGNOSIS — Z8249 Family history of ischemic heart disease and other diseases of the circulatory system: Secondary | ICD-10-CM

## 2022-05-29 DIAGNOSIS — Z7901 Long term (current) use of anticoagulants: Secondary | ICD-10-CM | POA: Diagnosis not present

## 2022-05-29 DIAGNOSIS — I5022 Chronic systolic (congestive) heart failure: Secondary | ICD-10-CM | POA: Diagnosis not present

## 2022-05-29 DIAGNOSIS — N3000 Acute cystitis without hematuria: Secondary | ICD-10-CM | POA: Diagnosis not present

## 2022-05-29 DIAGNOSIS — E46 Unspecified protein-calorie malnutrition: Secondary | ICD-10-CM | POA: Diagnosis present

## 2022-05-29 DIAGNOSIS — Z823 Family history of stroke: Secondary | ICD-10-CM

## 2022-05-29 DIAGNOSIS — Z7189 Other specified counseling: Secondary | ICD-10-CM | POA: Diagnosis not present

## 2022-05-29 DIAGNOSIS — A419 Sepsis, unspecified organism: Secondary | ICD-10-CM | POA: Diagnosis present

## 2022-05-29 DIAGNOSIS — E785 Hyperlipidemia, unspecified: Secondary | ICD-10-CM | POA: Diagnosis present

## 2022-05-29 DIAGNOSIS — R233 Spontaneous ecchymoses: Secondary | ICD-10-CM | POA: Diagnosis present

## 2022-05-29 DIAGNOSIS — R21 Rash and other nonspecific skin eruption: Secondary | ICD-10-CM | POA: Diagnosis present

## 2022-05-29 DIAGNOSIS — R1310 Dysphagia, unspecified: Secondary | ICD-10-CM | POA: Diagnosis present

## 2022-05-29 DIAGNOSIS — I10 Essential (primary) hypertension: Secondary | ICD-10-CM | POA: Diagnosis not present

## 2022-05-29 DIAGNOSIS — F02C18 Dementia in other diseases classified elsewhere, severe, with other behavioral disturbance: Secondary | ICD-10-CM | POA: Diagnosis present

## 2022-05-29 DIAGNOSIS — D631 Anemia in chronic kidney disease: Secondary | ICD-10-CM | POA: Diagnosis present

## 2022-05-29 DIAGNOSIS — G9341 Metabolic encephalopathy: Secondary | ICD-10-CM | POA: Diagnosis present

## 2022-05-29 DIAGNOSIS — E875 Hyperkalemia: Secondary | ICD-10-CM | POA: Diagnosis not present

## 2022-05-29 DIAGNOSIS — N3001 Acute cystitis with hematuria: Secondary | ICD-10-CM | POA: Diagnosis present

## 2022-05-29 DIAGNOSIS — Z79899 Other long term (current) drug therapy: Secondary | ICD-10-CM

## 2022-05-29 DIAGNOSIS — Z515 Encounter for palliative care: Secondary | ICD-10-CM | POA: Diagnosis not present

## 2022-05-29 DIAGNOSIS — Z8673 Personal history of transient ischemic attack (TIA), and cerebral infarction without residual deficits: Secondary | ICD-10-CM

## 2022-05-29 DIAGNOSIS — R627 Adult failure to thrive: Secondary | ICD-10-CM | POA: Diagnosis present

## 2022-05-29 DIAGNOSIS — R4182 Altered mental status, unspecified: Secondary | ICD-10-CM

## 2022-05-29 DIAGNOSIS — I4821 Permanent atrial fibrillation: Secondary | ICD-10-CM | POA: Diagnosis present

## 2022-05-29 DIAGNOSIS — E860 Dehydration: Secondary | ICD-10-CM | POA: Diagnosis present

## 2022-05-29 DIAGNOSIS — G309 Alzheimer's disease, unspecified: Secondary | ICD-10-CM | POA: Diagnosis present

## 2022-05-29 DIAGNOSIS — R6520 Severe sepsis without septic shock: Secondary | ICD-10-CM | POA: Diagnosis not present

## 2022-05-29 DIAGNOSIS — N1832 Chronic kidney disease, stage 3b: Secondary | ICD-10-CM | POA: Diagnosis present

## 2022-05-29 DIAGNOSIS — N179 Acute kidney failure, unspecified: Secondary | ICD-10-CM | POA: Diagnosis present

## 2022-05-29 DIAGNOSIS — Z1612 Extended spectrum beta lactamase (ESBL) resistance: Secondary | ICD-10-CM | POA: Diagnosis present

## 2022-05-29 DIAGNOSIS — I50813 Acute on chronic right heart failure: Secondary | ICD-10-CM | POA: Diagnosis not present

## 2022-05-29 DIAGNOSIS — B961 Klebsiella pneumoniae [K. pneumoniae] as the cause of diseases classified elsewhere: Secondary | ICD-10-CM | POA: Diagnosis present

## 2022-05-29 DIAGNOSIS — B952 Enterococcus as the cause of diseases classified elsewhere: Secondary | ICD-10-CM | POA: Diagnosis present

## 2022-05-29 DIAGNOSIS — Z888 Allergy status to other drugs, medicaments and biological substances status: Secondary | ICD-10-CM

## 2022-05-29 DIAGNOSIS — R54 Age-related physical debility: Secondary | ICD-10-CM | POA: Diagnosis present

## 2022-05-29 DIAGNOSIS — Z6822 Body mass index (BMI) 22.0-22.9, adult: Secondary | ICD-10-CM

## 2022-05-29 DIAGNOSIS — Z882 Allergy status to sulfonamides status: Secondary | ICD-10-CM

## 2022-05-29 DIAGNOSIS — I5082 Biventricular heart failure: Secondary | ICD-10-CM | POA: Diagnosis present

## 2022-05-29 DIAGNOSIS — R296 Repeated falls: Secondary | ICD-10-CM | POA: Diagnosis present

## 2022-05-29 DIAGNOSIS — K649 Unspecified hemorrhoids: Secondary | ICD-10-CM | POA: Diagnosis present

## 2022-05-29 HISTORY — DX: Permanent atrial fibrillation: I48.21

## 2022-05-29 LAB — COMPREHENSIVE METABOLIC PANEL
ALT: 18 U/L (ref 0–44)
AST: 31 U/L (ref 15–41)
Albumin: 3.9 g/dL (ref 3.5–5.0)
Alkaline Phosphatase: 106 U/L (ref 38–126)
Anion gap: 15 (ref 5–15)
BUN: 59 mg/dL — ABNORMAL HIGH (ref 8–23)
CO2: 30 mmol/L (ref 22–32)
Calcium: 9.1 mg/dL (ref 8.9–10.3)
Chloride: 88 mmol/L — ABNORMAL LOW (ref 98–111)
Creatinine, Ser: 1.94 mg/dL — ABNORMAL HIGH (ref 0.44–1.00)
GFR, Estimated: 24 mL/min — ABNORMAL LOW (ref >60)
Glucose, Bld: 123 mg/dL — ABNORMAL HIGH (ref 70–99)
Potassium: 3.5 mmol/L (ref 3.5–5.1)
Sodium: 133 mmol/L — ABNORMAL LOW (ref 135–145)
Total Bilirubin: 1.9 mg/dL — ABNORMAL HIGH (ref 0.3–1.2)
Total Protein: 8.2 g/dL — ABNORMAL HIGH (ref 6.5–8.1)

## 2022-05-29 LAB — CBC
HCT: 35.7 % — ABNORMAL LOW (ref 36.0–46.0)
Hemoglobin: 11.3 g/dL — ABNORMAL LOW (ref 12.0–15.0)
MCH: 28.9 pg (ref 26.0–34.0)
MCHC: 31.7 g/dL (ref 30.0–36.0)
MCV: 91.3 fL (ref 80.0–100.0)
Platelets: UNDETERMINED 10*3/uL (ref 150–400)
RBC: 3.91 MIL/uL (ref 3.87–5.11)
RDW: 19.1 % — ABNORMAL HIGH (ref 11.5–15.5)
WBC: 13.8 10*3/uL — ABNORMAL HIGH (ref 4.0–10.5)
nRBC: 0 % (ref 0.0–0.2)

## 2022-05-29 LAB — MAGNESIUM: Magnesium: 2.4 mg/dL (ref 1.7–2.4)

## 2022-05-29 LAB — URINALYSIS, ROUTINE W REFLEX MICROSCOPIC
Bilirubin Urine: NEGATIVE
Glucose, UA: NEGATIVE mg/dL
Ketones, ur: NEGATIVE mg/dL
Nitrite: NEGATIVE
Protein, ur: NEGATIVE mg/dL
Specific Gravity, Urine: 1.012 (ref 1.005–1.030)
pH: 6 (ref 5.0–8.0)

## 2022-05-29 LAB — DIFFERENTIAL
Abs Immature Granulocytes: 0.08 10*3/uL — ABNORMAL HIGH (ref 0.00–0.07)
Basophils Absolute: 0 10*3/uL (ref 0.0–0.1)
Basophils Relative: 0 %
Eosinophils Absolute: 0.1 10*3/uL (ref 0.0–0.5)
Eosinophils Relative: 0 %
Immature Granulocytes: 1 %
Lymphocytes Relative: 2 %
Lymphs Abs: 0.3 10*3/uL — ABNORMAL LOW (ref 0.7–4.0)
Monocytes Absolute: 0.2 10*3/uL (ref 0.1–1.0)
Monocytes Relative: 2 %
Neutro Abs: 13 10*3/uL — ABNORMAL HIGH (ref 1.7–7.7)
Neutrophils Relative %: 95 %

## 2022-05-29 LAB — LACTIC ACID, PLASMA
Lactic Acid, Venous: 2.7 mmol/L (ref 0.5–1.9)
Lactic Acid, Venous: 2.3 mmol/L (ref 0.5–1.9)

## 2022-05-29 LAB — PROTIME-INR
INR: 3.2 — ABNORMAL HIGH (ref 0.8–1.2)
Prothrombin Time: 32.5 seconds — ABNORMAL HIGH (ref 11.4–15.2)

## 2022-05-29 LAB — PLATELET COUNT: Platelets: UNDETERMINED 10*3/uL (ref 150–400)

## 2022-05-29 LAB — APTT: aPTT: 42 seconds — ABNORMAL HIGH (ref 24–36)

## 2022-05-29 LAB — LACTATE DEHYDROGENASE: LDH: 194 U/L — ABNORMAL HIGH (ref 98–192)

## 2022-05-29 MED ORDER — LACTATED RINGERS IV SOLN
INTRAVENOUS | Status: AC
Start: 2022-05-29 — End: 2022-05-30

## 2022-05-29 MED ORDER — SODIUM CHLORIDE 0.9 % IV SOLN
2.0000 g | Freq: Two times a day (BID) | INTRAVENOUS | Status: DC
  Administered 2022-05-29 – 2022-05-31 (×4): 2 g via INTRAVENOUS
  Filled 2022-05-29 (×5): qty 2000

## 2022-05-29 MED ORDER — ACETAMINOPHEN 325 MG PO TABS: 650.0000 mg | ORAL_TABLET | Freq: Four times a day (QID) | ORAL | Status: DC | PRN

## 2022-05-29 MED ORDER — SODIUM CHLORIDE 0.9 % IV SOLN
1.0000 g | Freq: Two times a day (BID) | INTRAVENOUS | Status: DC
  Filled 2022-05-29: qty 1000

## 2022-05-29 MED ORDER — LACTATED RINGERS IV BOLUS
1000.0000 mL | Freq: Once | INTRAVENOUS | Status: AC
  Administered 2022-05-29: 1000 mL via INTRAVENOUS

## 2022-05-29 MED ORDER — ACETAMINOPHEN 650 MG RE SUPP
650.0000 mg | Freq: Four times a day (QID) | RECTAL | Status: DC | PRN
  Administered 2022-05-30: 650 mg via RECTAL
  Filled 2022-05-29: qty 1

## 2022-05-29 MED ORDER — LACTATED RINGERS IV BOLUS
500.0000 mL | Freq: Once | INTRAVENOUS | Status: AC
  Administered 2022-05-29: 500 mL via INTRAVENOUS

## 2022-05-29 NOTE — Assessment & Plan Note (Signed)
  #)   Permanent atrial fibrillation: Documented history of such. In setting of CHA2DS2-VASc score of 8, there is an indication for chronic anticoagulation for thromboembolic prophylaxis. Consistent with this, patient is chronically anticoagulated on Xarelto. Home AV nodal blocking regimen: Atenolol.  Most recent echocardiogram was performed in January 2023, with results as further detailed above. Presenting EKG appears to demonstrate atrial fibrillation with heart rates in the low 100s, without overt evidence of acute ischemic changes, as further detailed above.   Plan: monitor strict I's & O's and daily weights. Repeat BMP/CBC in AM. Check serum mag level.  In the setting of current n.p.o. status, holding home Xarelto as well as atenolol for now.  Monitor on telemetry.

## 2022-05-29 NOTE — Assessment & Plan Note (Signed)
  #)   Essential Hypertension: documented h/o such, with outpatient antihypertensive regimen including atenolol.  SBP's in the ED today: Normotensive.   Plan: Close monitoring of subsequent BP via routine VS. in the setting of presenting severe sepsis, will hold home atenolol for now.

## 2022-05-29 NOTE — Assessment & Plan Note (Signed)
#)  Acute metabolic encephalopathy: Per patient's son, 4 to 5 days progressive confusion and somnolence relative to baseline mental status, which the patient has documented history of advanced dementia. [name] acute encephalopathy appears to be on the basis of physiologic stressors stemming from presenting sepsis d/t suspected severe sepsis due to urinary tract infection in spite of 3 doses of Macrobid as an outpatient, warranting initiation of IV antibiotics as further detailed below.  No obvious additional contributory underlying infectious process at this time, including chest x-ray shows no evidence of acute cardiopulmonary process.    No additional overt metabolic/electrolyte contributions at this time.  However, in the setting of urinalysis demonstrating the presence of hemoglobin in the absence of RBCs, will-CPK level to evaluate for any contributory rhabdomyolysis.  Will also closely monitor petechial rash associated with bilateral lower extremity, as further detailed below.   While felt to be not of primary contribution, potential secondary contributions from pharmacologic factors, namely multiple central acting medications on an outpatient basis, including scheduled gabapentin.   No overt acute focal neurologic deficits to suggest a contribution from an underlying acute CVA. Seizures are also felt to be less likely. Will keep patient NPO until mental status improves sufficiently that patient is able to participate in and pass nursing bedside swallow screen.      Plan: fall precautions. Repeat CMP/CBC in the AM. Check magnesium level. check TSH, MMA. NPO pending nursing bedside swallow evaluation prior to the initiation of a diet/oral medications, as described above.  Hold central acting outpatient medications, including scheduled gabapentin for now.  Further evaluation management presents for sepsis due to urinary tract infection, as further detailed below.  Add on UDS.  Check ammonia level,  CPK, ionized calcium level.  Further evaluation of petechial rash, as below.

## 2022-05-29 NOTE — ED Notes (Signed)
Patient transported to CT 

## 2022-05-29 NOTE — Telephone Encounter (Signed)
Send to ED for further evaluation.

## 2022-05-29 NOTE — Assessment & Plan Note (Signed)
  #)   Severe sepsis due to urinary tract infection: In the setting of 4 to 5 days of progressive altered mental status, as above, patient was recently diagnosed as an outpatient with UTI, per urinalysis on 05/23/2022, with associated urine culture positive for Enterococcus facialis, with associated sensitivities, as described above.  Her altered mental status/somnolence has progressed and spite to 3 doses of interval Macrobid, prompting admission for initiation of IV antibiotics.  Per discussions with inpatient pharmacy regarding results of urine culture from 05/23/2022 including associated sensitivities, patient pharmacy subsequently recommends IV ampicillin on a twice daily basis, which has been initiated in the ED this evening.  SIRS criteria met via leukocytosis, tachycardia. Lactic acid level: Initially 2.7, with repeat value trending down to 2.3 following interval IV fluid bolus x1.5 L. Of note, given the associated presence of suspected end organ damage in the form of concominant presenting elevated lactate as well as suspected acute metabolic encephalopathy, criteria are met for pt's sepsis to be considered severe in nature. However, in the absence of lactic acid level that is greater than or equal to 4.0, and in the absence of any associated hypotension refractory to IVF's, there are no indications for administration of a 30 mL/kg IVF bolus at this time.   Additional ED work-up/management notable for: Collection of blood cultures x2 as well as urine culture this evening prior to initiation of IV ampicillin, as above.  No e/o additional infectious process at this time, including present chest x-ray shows no evidence of acute cardiopulmonary process.    Plan: CBC w/ diff and CMP in AM.  Follow for results of blood cx's x 2.  Follow-up result of updated urine culture collected this evening.  Abx: Continue ampicillin per inpatient pharmacy recommendations, as above.  Repeat lactate at 2300.  Will  enlarging the patient's history of chronic right-sided heart failure, will provide interval continuous lactated Ringer's at 75 cc/h, following for updated lactate result.

## 2022-05-29 NOTE — H&P (Signed)
History and Physical    PLEASE NOTE THAT [employer]AGON DICT[employer] SOFTWARE WAS USED IN THE CONSTRUCTION OF THIS NOTE.   [name] Dyer AST:[id number] DOB: 10/11/1932 DOA: 05/29/2022  PCP: [name], NP  Patient coming from: home   I have personally briefly reviewed patient's old medical records in [place]  Chief Complaint: altered mental status  HPI: [name] Dyer is a 88 y.o. female with medical history significant for advanced dementia, chronic anemia associated baseline hemoglobin 11-13, chronic right-sided heart failure, hyperlipidemia, hypertension, permanent atrial fibrillation chronically anticoagulated on Xarelto, stage IV CKD with baseline creatinine 1.5-2.0, who is admitted to [facility] on 05/29/2022 with suspected acute metabolic encephalopathy after presenting from home to [facility] ED for evaluation of altered mental status.   In the context of the patient's altered mental status, the following history is provided by the patient's son, is present at bedside, in addition to my discussions with the EDP and via chart review.  Patient sent the patient has evidence of progressive confusion/somnolence over the last 4 to 5 days, prompting laboratory evaluation that included urinalysis that was reportedly suggestive of UTI, with chart review revealing associated urine culture drawn on May 26 that is positive for Enterococcus faecalis, which was sensitive to Macrobid, vancomycin, and ampicillin.  On the basis of this result with associated sensitivities, she was started on Macrobid, and has completed 3 such doses of this antibiotic, without any ensuing improvement into her mental status, prompting the patient to present to [facility] long emergency department seen for further evaluation and management thereof.  No preceding trauma.  Son reports that after the patient received her second dose of Macrobid, that he noted new onset rash involving the bilateral lower extremities, which  is new for her.  With exception of this rash, son reports that the patient's confusion/somnolence is very similar in nature to that which the patient was experiencing in April 2023, at which time she was admitted for acute metabolic encephalopathy in the setting of urinary tract infection associated with ESBL as well as Klebsiella.  During that hospitalization, she reportedly received 3 doses of meropenem, and son reports that the patient's mental status subsequently returned to baseline with IV antibiotics and that she remained at her baseline mental status into the last 4 to 5 days, as above.  In qualifying her baseline ental status, the patient's son reports that his advanced dementia is chronically diffuse, but knows her name, date of birth, home address, but does not know month or year.   Of note, patient also had a hospitalization in January 2023 for acute ischemic stroke at that time.  Medical history is notable for chronic anemia with baseline hemoglobin 11-13, with most recent prior hemoglobin noted to be 10.8 on 05/23/2022.  She also has a history of stage IV chronic kidney disease associated baseline creatinine range of 1.5-2.0.      ED Course:  Vital signs in the ED were notable for the following: Temperature max 99.3; initial heart rate 104, which decreased to 96 solumedrol IV fluids; blood pressure initially 107/80, which subsequently increased to 139/70 following interval IV fluids; respiratory rate 16-21, oxygen saturation 96 to 100% on room air.  Labs were notable for the following: CMP notable for the following: Sodium 133,.  Most recent prior value of 127 on 05/23/2022, bicarbonate 30, BUN 59, creatinine 1.94, BUN/creatinine ratio 13.4, glucose 123, calcium 9.1, total bilirubin 1.9, otherwise liver enzymes within normal limits.  LDH 194.  Initial  lactate 2.7, 3.5 down to 2.3.  CBC notable for white blood cell count 13,800 with 95% neutrophils, hemoglobin 11.3, platelet count unclear  as platelets were determined to be clumped.  Urinalysis associated hazy appearing specimen and notable for 11-20 white blood cells, many bacteria, large leukocyte esterase, Nischal suboptimal cells, negative protein, positive hyaline casts demonstrating small hemoglobin in the absence of any RBCs.  Urine culture as well as blood cultures x2 collected prior to initiation of IV antibiotics.  Imaging and additional notable ED work-up: EKG interpretation limited by the presence of artifact, but within these confines, it appears to demonstrate atrial fibrillation with heart rate 108, and no evidence of overt T wave or ST changes.  Chest x-ray shows no evidence of acute cardiopulmonary process.  Noncontrast CT head shows no evidence of acute intracranial process.  EDP discussed patient's case, including urine culture results from May 26, with ensuing recommendations for initiation of IV ampicillin twice daily.  While in the ED, the following were administered: Ampicillin 2 g IV x1, lactated Ringer's x1.5 L bolus.  Subsequently, the patient was admitted for further evaluation and management of suspected acute metabolic encephalopathy in the setting of severe sepsis due to urinary tract infection.    Review of Systems: As per HPI otherwise 10 point review of systems negative.   Past Medical History:  Diagnosis Date   Alzheimer disease ([place])    Anemia    Arrhythmia    Cataract    Colon polyps    Community acquired pneumonia 01/2012   LLL   Congestive heart failure ([place])    Hyperlipidemia    Hypertension    Memory difficulty 10/01/2016   Osteoporosis    Permanent atrial fibrillation (HCC)    Pleural effusion, right 01/2012   SVT (supraventricular tachycardia) ([place]) 01/2012   UTI (lower urinary tract infection)     Past Surgical History:  Procedure Laterality Date   [place]   COLONOSCOPY     COLONOSCOPY N/A 06/10/2016   Procedure: COLONOSCOPY;   Surgeon: Manus Gunning, MD;  Location: [facility];  Service: Gastroenterology;  Laterality: N/A;   ESOPHAGOGASTRODUODENOSCOPY N/A 06/10/2016   Procedure: ESOPHAGOGASTRODUODENOSCOPY (EGD);  Surgeon: Manus Gunning, MD;  Location: [facility];  Service: Gastroenterology;  Laterality: N/A;   HEMIARTHROPLASTY HIP  2011   Left femoral neck fracture   RIGHT/LEFT HEART CATH AND CORONARY ANGIOGRAPHY N/A 01/08/2022   Procedure: RIGHT/LEFT HEART CATH AND CORONARY ANGIOGRAPHY;  Surgeon: Leonie Man, MD;  Location: [facility];  Service: Cardiovascular;  Laterality: N/A;   UPPER GASTROINTESTINAL ENDOSCOPY      Social History:  reports that she has never smoked. She has never used smokeless tobacco. She reports that she does not drink alcohol and does not use drugs.   Allergies  Allergen Reactions   Ciprofloxacin Diarrhea   Sulfa Antibiotics Hives and Rash    Severe Rash   Memantine Other (See Comments)    Dizziness. Pt takes this medication at home   Succinylsulphathiazole Rash   Sulfamethoxazole Rash    Family History  Problem Relation Age of Onset   Heart Problems Mother    Osteoporosis Mother    Stroke Father    Heart Problems Father    Other Brother 59       COVID   Colon polyps Son    Parkinson's disease Son    Neuropathy Son    Heart disease Son    Cancer  Grandchild     Family history reviewed and not pertinent    Prior to Admission medications   Medication Sig Start Date End Date Taking? Authorizing Provider  acetaminophen (TYLENOL) 500 MG tablet Take 1,000 mg by mouth every 6 (six) hours as needed for mild pain or headache.    Yes [provider]  alendronate (FOSAMAX) 70 MG tablet Take 1 tablet (70 mg total) by mouth once a week. 01/20/22  Yes Ngetich, [name] C, NP  allopurinol (ZYLOPRIM) 100 MG tablet Take 1 tablet (100 mg total) by mouth daily. 02/10/22  Yes Ngetich, [name] C, NP  atenolol (TENORMIN) 50 MG tablet Take 1 tablet (50 mg  total) by mouth daily. 05/23/22  Yes Ngetich, [name] C, NP  calcium carbonate (OS-CAL - DOSED IN MG OF ELEMENTAL CALCIUM) 1250 (500 Ca) MG tablet Take 1 tablet (1,250 mg total) by mouth in the morning. 01/18/22  Yes Margie Billet, PA-C  cholecalciferol (VITAMIN D3) 25 MCG (1000 UT) tablet Take 2,000 Units by mouth daily.   Yes [provider]  colchicine 0.6 MG tablet Take 2 tablets by mouth x 1 dose then one tablet daily x 3 days wait 12 hours prior to resuming Allopurinol Patient taking differently: Take 0.6 mg by mouth daily as needed (flare up). Wait 12 hours prior to resuming Allopurinol 04/28/22  Yes Ngetich, [name] C, NP  donepezil (ARICEPT) 10 MG tablet Take 1 tablet (10 mg total) by mouth at bedtime. 07/02/21  Yes Suzzanne Cloud, NP  furosemide (LASIX) 40 MG tablet TAKE 40 MG DAILY ALTERNATING EVERY OTHER DAY WITH 60 MG (1 AND 1/2 TABLET)DAILY 02/10/22  Yes [name], MD  gabapentin (NEURONTIN) 300 MG capsule TAKE 1 CAPSULE BY MOUTH THREE TIMES A DAY 02/24/22  Yes Ngetich, [name] C, NP  ipratropium (ATROVENT) 0.06 % nasal spray CAN USE 2 SPRAYS IN EACH NOSTRIL EVERY 6 HOURS IF NEEDED TO DRY UP RUNNY N0SE 12/04/20  Yes Kozlow, Donnamarie Poag, MD  memantine (NAMENDA) 5 MG tablet Take 1 tablet (5 mg total) by mouth 2 (two) times daily. 07/02/21  Yes Suzzanne Cloud, NP  Menthol, Topical Analgesic, (ICY HOT EX) Apply 1 application. topically at bedtime.   Yes [provider]  metolazone (ZAROXOLYN) 2.5 MG tablet TAKE ONE TABLET BY MOUTH TWICE WEEKLY - 30 MINUTES PRIOR TO TAKING YOUR 60MG DOSE OF FUROSEMIDE 04/10/22  Yes [name], MD  Multiple Vitamins-Minerals (MULTIPLE VITAMINS/WOMENS PO) Take 1 tablet by mouth daily.   Yes [provider]  nitrofurantoin (MACRODANTIN) 100 MG capsule Take 1 capsule (100 mg total) by mouth 2 (two) times daily. For 7 days. 05/28/22  Yes Ngetich, [name] C, NP  omeprazole (PRILOSEC) 20 MG capsule Take 1 capsule (20 mg total) by mouth daily. 05/09/22   Yes Ngetich, [name] C, NP  potassium chloride SA (KLOR-CON M) 20 MEQ tablet Take 2 tablets (40 mEq total) by mouth daily. 05/28/22  Yes Ngetich, [name] C, NP  Rivaroxaban (XARELTO) 15 MG TABS tablet TAKE 1 TABLET (15 MG TOTAL) BY MOUTH DAILY WITH SUPPER. 01/08/22  Yes [name], MD  rosuvastatin (CRESTOR) 5 MG tablet Take 1 tablet (5 mg total) by mouth daily. 02/10/22  Yes [name], MD  saccharomyces boulardii (FLORASTOR) 250 MG capsule Take 1 capsule (250 mg total) by mouth 2 (two) times daily. For 10 days. 05/28/22  Yes Ngetich, [name] C, NP  traZODone (DESYREL) 50 MG tablet Take 50 mg by mouth at bedtime as needed  for sleep.   Yes [provider]  vitamin B-12 (CYANOCOBALAMIN) 1000 MCG tablet Take 1,000 mcg by mouth daily.   Yes [provider]  vitamin C (ASCORBIC ACID) 250 MG tablet Take 250 mg by mouth daily.   Yes [provider]     Objective    Physical Exam: Vitals:   05/29/22 1930 05/29/22 1945 05/29/22 2000 05/29/22 2015  BP: 125/73 131/67 113/75 126/67  Pulse: 96 88 98 (!) 101  Resp: _0 Temp:      TempSrc:      SpO2: 100% 100% 100% (!) 84%    General: appears to be stated age; somnolent, nonverbal, unable to follow instructions at this time Skin: warm, dry, no rash Head:  AT/ Mouth:  Oral mucosa membranes appear dry, normal dentition Neck: supple; trachea midline Heart:  RRR; did not appreciate any M/R/G Lungs: CTAB, did not appreciate any wheezes, rales, or rhonchi Abdomen: + BS; soft, ND, NT Vascular: 2+ pedal pulses b/l; 2+ radial pulses b/l Extremities: no peripheral edema, no muscle wasting Neuro: In the setting of the patient's current mental status and associated inability to follow instructions, unable to perform full neurologic exam at this time.  As such, assessment of strength, sensation, and cranial nerves is limited at this time. Patient noted to spontaneously move all 4 extremities. No tremors.     Labs on  Admission: I have personally reviewed following labs and imaging studies  CBC: Recent Labs  Lab 05/23/22 1442 05/29/22 1630 05/29/22 2031  WBC 7.4 13.8*  --   NEUTROABS 5,705 13.0*  --   HGB 10.8* 11.3*  --   HCT 33.0* 35.7*  --   MCV 88.7 91.3  --   PLT 108* PLATELET CLUMPS NOTED ON SMEAR, UNABLE TO ESTIMATE PLATELET CLUMPS NOTED ON SMEAR, UNABLE TO ESTIMATE   Basic Metabolic Panel: Recent Labs  Lab 05/23/22 1442 05/29/22 1536 05/29/22 1727  NA 127* 133*  --   K 2.8* 3.5  --   CL 81* 88*  --   CO2 31 30  --   GLUCOSE 88 123*  --   BUN 50* 59*  --   CREATININE 1.50* 1.94*  --   CALCIUM 9.4 9.1  --   MG  --   --  2.4   GFR: Estimated Creatinine Clearance: 17.3 mL/min (A) (by C-G formula based on SCr of 1.94 mg/dL (H)). Liver Function Tests: Recent Labs  Lab 05/23/22 1442 05/29/22 1536  AST 23 31  ALT 16 18  ALKPHOS  --  106  BILITOT 1.0 1.9*  PROT 6.9 8.2*  ALBUMIN  --  3.9   No results for input(s): LIPASE, AMYLASE in the last 168 hours. No results for input(s): AMMONIA in the last 168 hours. Coagulation Profile: Recent Labs  Lab 05/29/22 1630  INR 3.2*   Cardiac Enzymes: No results for input(s): CKTOTAL, CKMB, CKMBINDEX, TROPONINI in the last 168 hours. BNP (last 3 results) Recent Labs    02/13/22 1433 02/20/22 1419 03/11/22 1315  PROBNP 4,177* 4,228* 3,640*   HbA1C: No results for input(s): HGBA1C in the last 72 hours. CBG: No results for input(s): GLUCAP in the last 168 hours. Lipid Profile: No results for input(s): CHOL, HDL, LDLCALC, TRIG, CHOLHDL, LDLDIRECT in the last 72 hours. Thyroid Function Tests: No results for input(s): TSH, T4TOTAL, FREET4, T3FREE, THYROIDAB in the last 72 hours. Anemia Panel: No results for input(s): VITAMINB12, FOLATE, FERRITIN, TIBC, IRON, RETICCTPCT in the last  72 hours. Urine analysis:    Component Value Date/Time   COLORURINE YELLOW 05/29/2022 1829   APPEARANCEUR HAZY (A) 05/29/2022 1829   LABSPEC 1.012  05/29/2022 1829   PHURINE 6.0 05/29/2022 1829   GLUCOSEU NEGATIVE 05/29/2022 1829   HGBUR SMALL (A) 05/29/2022 1829   BILIRUBINUR NEGATIVE 05/29/2022 1829   BILIRUBINUR negative 05/23/2022 1513   KETONESUR NEGATIVE 05/29/2022 1829   PROTEINUR NEGATIVE 05/29/2022 1829   UROBILINOGEN negative (A) 05/23/2022 1513   UROBILINOGEN 0.2 03/22/2015 1302   NITRITE NEGATIVE 05/29/2022 1829   LEUKOCYTESUR LARGE (A) 05/29/2022 1829    Radiological Exams on Admission: CT HEAD WO CONTRAST (5MM)  Result Date: 05/29/2022 CLINICAL DATA:  Delirium.  Altered level of consciousness. EXAM: CT HEAD WITHOUT CONTRAST TECHNIQUE: Contiguous axial images were obtained from the base of the skull through the vertex without intravenous contrast. RADIATION DOSE REDUCTION: This exam was performed according to the departmental dose-optimization program which includes automated exposure control, adjustment of the mA and/or kV according to patient size and/or use of iterative reconstruction technique. COMPARISON:  Head CT and brain MRI 05/18/2022 FINDINGS: Brain: No acute intracranial hemorrhage. Stable atrophy. Advanced periventricular and deep chronic small vessel ischemia. Multiple remote cerebral and cerebellar chronic infarcts are unchanged from prior exam. No evidence of acute ischemia, although degree of background chronic change limits detailed assessment. No subdural or extra-axial collection. Vascular: Atherosclerosis of skullbase vasculature without hyperdense vessel or abnormal calcification. Skull: No fracture or focal lesion. Sinuses/Orbits: No acute findings.  Bilateral cataract resection. Other: None. IMPRESSION: 1. No acute intracranial abnormality. 2. Stable atrophy and advanced chronic small vessel ischemia. Multiple remote cerebral and cerebellar infarcts are unchanged from prior exam. Electronically Signed   By: Keith Rake M.D.   On: 05/29/2022 18:00   DG Chest Port 1 View  Result Date: 05/29/2022 CLINICAL  DATA:  Possible sepsis EXAM: PORTABLE CHEST 1 VIEW COMPARISON:  Prior chest x-ray 05/18/2022 FINDINGS: Cardiomegaly. No edema, focal airspace infiltrate, pleural effusion or pneumothorax. No acute osseous abnormality. IMPRESSION: Stable chest x-ray and cardiomegaly without evidence of pneumonia. Electronically Signed   By: Jacqulynn Cadet M.D.   On: 05/29/2022 15:48     EKG: Independently reviewed, with result as described above.    Assessment/Plan    Principal Problem:   Acute metabolic encephalopathy Active Problems:   Anemia   Permanent atrial fibrillation (HCC)   Hypertension   Hyperlipidemia   Acute cystitis   Rash   Severe sepsis (HCC)   Chronic systolic CHF (congestive heart failure) (HCC)      #) Acute metabolic encephalopathy: Per patient's son, 4 to 5 days progressive confusion and somnolence relative to baseline mental status, which the patient has documented history of advanced dementia. [name] acute encephalopathy appears to be on the basis of physiologic stressors stemming from presenting sepsis d/t suspected severe sepsis due to urinary tract infection in spite of 3 doses of Macrobid as an outpatient, warranting initiation of IV antibiotics as further detailed below.  No obvious additional contributory underlying infectious process at this time, including chest x-ray shows no evidence of acute cardiopulmonary process.    No additional overt metabolic/electrolyte contributions at this time.  However, in the setting of urinalysis demonstrating the presence of hemoglobin in the absence of RBCs, will-CPK level to evaluate for any contributory rhabdomyolysis.  Will also closely monitor petechial rash associated with bilateral lower extremity, as further detailed below.   While felt to be not of primary contribution, potential secondary contributions from pharmacologic  factors, namely multiple central acting medications on an outpatient basis, including scheduled  gabapentin.   No overt acute focal neurologic deficits to suggest a contribution from an underlying acute CVA. Seizures are also felt to be less likely. Will keep patient NPO until mental status improves sufficiently that patient is able to participate in and pass nursing bedside swallow screen.      Plan: fall precautions. Repeat CMP/CBC in the AM. Check magnesium level. check TSH, MMA. NPO pending nursing bedside swallow evaluation prior to the initiation of a diet/oral medications, as described above.  Hold central acting outpatient medications, including scheduled gabapentin for now.  Further evaluation management presents for sepsis due to urinary tract infection, as further detailed below.  Add on UDS.  Check ammonia level, CPK, ionized calcium level.  Further evaluation of petechial rash, as below.            #) Severe sepsis due to urinary tract infection: In the setting of 4 to 5 days of progressive altered mental status, as above, patient was recently diagnosed as an outpatient with UTI, per urinalysis on 05/23/2022, with associated urine culture positive for Enterococcus facialis, with associated sensitivities, as described above.  Her altered mental status/somnolence has progressed and spite to 3 doses of interval Macrobid, prompting admission for initiation of IV antibiotics.  Per discussions with inpatient pharmacy regarding results of urine culture from 05/23/2022 including associated sensitivities, patient pharmacy subsequently recommends IV ampicillin on a twice daily basis, which has been initiated in the ED this evening.  SIRS criteria met via leukocytosis, tachycardia. Lactic acid level: Initially 2.7, with repeat value trending down to 2.3 following interval IV fluid bolus x1.5 L. Of note, given the associated presence of suspected end organ damage in the form of concominant presenting elevated lactate as well as suspected acute metabolic encephalopathy, criteria are met for  pt's sepsis to be considered severe in nature. However, in the absence of lactic acid level that is greater than or equal to 4.0, and in the absence of any associated hypotension refractory to IVF's, there are no indications for administration of a 30 mL/kg IVF bolus at this time.   Additional ED work-up/management notable for: Collection of blood cultures x2 as well as urine culture this evening prior to initiation of IV ampicillin, as above.  No e/o additional infectious process at this time, including present chest x-ray shows no evidence of acute cardiopulmonary process.    Plan: CBC w/ diff and CMP in AM.  Follow for results of blood cx's x 2.  Follow-up result of updated urine culture collected this evening.  Abx: Continue ampicillin per inpatient pharmacy recommendations, as above.  Repeat lactate at 2300.  Will enlarging the patient's history of chronic right-sided heart failure, will provide interval continuous lactated Ringer's at 75 cc/h, following for updated lactate result.        #) Rash: New onset petechial appearing nonblanching rash involving the bilateral lower extremities distal to the bilateral knees, potentially related to recently initiated Macrobid, with son specifically reporting development of this rash following second dose of this medication.  Of note, confirmed with patient's chronic anemia, presenting hemoglobin consistent with his baseline range, is slightly higher than most recent prior value.  However, given the presence of petechial rash following Macrobid, will closely monitor for e/o microangiopathic hemolytic anemia, including pursuit of peripheral smear.  Of note, total bilirubin is very mildly elevated at 1.9, but will continue to trend this value and further expand work-up,  as detailed below.  Kidney function appears at baseline.  Platelet count currently unclear, as initial CBC demonstrated clumping of associated platelets, with repeat platelet count currently  pending.  Plan: Place dressing communication requesting the current distribution of the patient's regimen in the bilateral lower extremities be worked up for further trending of distribution.  Follow-up for updated platelet count.  Repeat CBC in the morning.  CMP in the morning, along with direct bilirubin level.  Check INR, PTT.  Check peripheral smear via technologist smear review ordered.  Check LDH.       #) Chronic right-sided systolic heart failure documented as result, most recent echocardiogram on 01/09/2022 notable for LVEF 50 to 55%, indeterminate diastolic function, and moderately reduced right ventricular systolic function.  Outpatient diuretic regimen includes Lasix 40 mg prn to 48 hours as well as alternating Lasix 60 mg p.o. every 48 hours in addition to metolazone which patient takes twice a week.  No clinical or radiographic evidence to suggest acutely decompensated failure at this time, particularly in the setting of her recent decline in oral intake due to increased somnolence over the preceding days as well as chest x-ray shows no evidence of acute cardiopulmonary process.  Clinically, she appears mildly intravascularly depleted in the setting of severe sepsis will as well as diminished oral intake.  We will pursue gentle IV fluids, closely monitor for ensuing evidence of acute volume overload.  Plan: Hold home Lasix and metolazone for now.  Monitor strict I's and O's and daily weights.  Add on serum magnesium level.  Repeat CMP in the morning.           #) Hyperlipidemia: documented h/o such. On rosuvastatin as outpatient.    Plan: In the setting of current n.p.o. status, will hold home statin.           #) Essential Hypertension: documented h/o such, with outpatient antihypertensive regimen including atenolol.  SBP's in the ED today: Normotensive.   Plan: Close monitoring of subsequent BP via routine VS. in the setting of presenting severe sepsis, will hold  home atenolol for now.             #) Permanent atrial fibrillation: Documented history of such. In setting of CHA2DS2-VASc score of 8, there is an indication for chronic anticoagulation for thromboembolic prophylaxis. Consistent with this, patient is chronically anticoagulated on Xarelto. Home AV nodal blocking regimen: Atenolol.  Most recent echocardiogram was performed in January 2023, with results as further detailed above. Presenting EKG appears to demonstrate atrial fibrillation with heart rates in the low 100s, without overt evidence of acute ischemic changes, as further detailed above.   Plan: monitor strict I's & O's and daily weights. Repeat BMP/CBC in AM. Check serum mag level.  In the setting of current n.p.o. status, holding home Xarelto as well as atenolol for now.  Monitor on telemetry.        #) Stage IV CKD: Documented history of such, associated baseline creatinine range of 1.5-2.0, with presenting serum creatinine two-point noted to be consistent with his baseline range, will also noting evidence of acute prerenal azotemia, suspected to be on the basis of mild intravascular dilution given recent decline in oral intake, as above.  Plan: Monitor strict I's and O's Daily weights.  Tempt avoid nephrotoxic agents.  Add on serum magnesium level.  Repeat CMP in the morning.  Gentle IV fluids, as outlined above.         DVT prophylaxis: SCD's   Code  Status: Full code (per my discussions with the patient's son, who is POA) Family Communication: I discussed the patient's case with her son, who is present at bedside Disposition Plan: Per Rounding Team Consults called: none;  Admission status: Inpatient  PLEASE NOTE THAT DRAGON DICTATION SOFTWARE WAS USED IN THE CONSTRUCTION OF THIS NOTE.   [place] DO [facility] [place]   05/29/2022, 10:43 PM

## 2022-05-29 NOTE — Assessment & Plan Note (Signed)
 #)   Chronic right-sided systolic heart failure documented as result, most recent echocardiogram on 01/09/2022 notable for LVEF 50 to 55%, indeterminate diastolic function, and moderately reduced right ventricular systolic function.  Outpatient diuretic regimen includes Lasix 40 mg prn to 48 hours as well as alternating Lasix 60 mg p.o. every 48 hours in addition to metolazone which patient takes twice a week.  No clinical or radiographic evidence to suggest acutely decompensated failure at this time, particularly in the setting of her recent decline in oral intake due to increased somnolence over the preceding days as well as chest x-ray shows no evidence of acute cardiopulmonary process.  Clinically, she appears mildly intravascularly depleted in the setting of severe sepsis will as well as diminished oral intake.  We will pursue gentle IV fluids, closely monitor for ensuing evidence of acute volume overload.  Plan: Hold home Lasix and metolazone for now.  Monitor strict I's and O's and daily weights.  Add on serum magnesium level.  Repeat CMP in the morning.

## 2022-05-29 NOTE — ED Notes (Signed)
Lavender tube drawn and sent to lab

## 2022-05-29 NOTE — ED Triage Notes (Signed)
Pt BIB EMS from home. Pt has dementia and Alzheimers. Pt is oriented to person and date of birth at baseline. Family called EMS for reaction from a medication (Macrobid) prescribed for a UTI. This is the second UTI in less than a months time. Pt has bilateral petechia to lower extremities. Pt has decreased LOC per family. EMS states pt is warm to touch.  Capnography 24 Resp 30 A Fib with RVR 120 bpm per EMS Pt has hx of A Fib Cbg 149  20G R hand 400 mL of NS given by EMS 50 mg benadryl IV given by EMS  EMS reports clear lung sounds EMS reports pt had garbled speech post benadryl, last normal speech at 1456. EMS reports pt had normal stroke screen with them.

## 2022-05-29 NOTE — Telephone Encounter (Signed)
Patient son, [name] called and stated that patient's condition has worsened.  Patient was scheduled to come into office to be seen for leg redness from midcalf down to feet bilateral. No pain.   Now patient cannot walk and has tremors all over. Son stated that her condition is worsening and she doesn't look good. Son isn't sure if she is having a reaction to the Antibiotic prescribed yesterday or not.   Son is calling 911 to have them come and access patient. Stated that he cannot bring her into office because she cannot walk.   Agreed.

## 2022-05-29 NOTE — Assessment & Plan Note (Signed)
#)  Rash: New onset petechial appearing nonblanching rash involving the bilateral lower extremities distal to the bilateral knees, potentially related to recently initiated Macrobid, with son specifically reporting development of this rash following second dose of this medication.  Of note, confirmed with patient's chronic anemia, presenting hemoglobin consistent with his baseline range, is slightly higher than most recent prior value.  However, given the presence of petechial rash following Macrobid, will closely monitor for e/o microangiopathic hemolytic anemia, including pursuit of peripheral smear.  Of note, total bilirubin is very mildly elevated at 1.9, but will continue to trend this value and further expand work-up, as detailed below.  Kidney function appears at baseline.  Platelet count currently unclear, as initial CBC demonstrated clumping of associated platelets, with repeat platelet count currently pending.  Plan: Place dressing communication requesting the current distribution of the patient's regimen in the bilateral lower extremities be worked up for further trending of distribution.  Follow-up for updated platelet count.  Repeat CBC in the morning.  CMP in the morning, along with direct bilirubin level.  Check INR, PTT.  Check peripheral smear via technologist smear review ordered.  Check LDH.

## 2022-05-29 NOTE — Assessment & Plan Note (Signed)
 #)   Hyperlipidemia: documented h/o such. On rosuvastatin as outpatient.    Plan: In the setting of current n.p.o. status, will hold home statin.

## 2022-05-29 NOTE — ED Provider Notes (Signed)
[facility] DEPT Provider Note   CSN: [id number] Arrival date & time: 05/29/22  1444     History  Chief Complaint  Patient presents with   Medication Reaction    [name] Dyer is a 88 y.o. female.  HPI Patient is an 88 year old female with history of CHF, permanent atrial fibrillation, Alzheimer's disease, CVA, who presents to the emergency department via EMS due to lethargy.  Patient has a history of dementia and at this time can only tell me her name.  Per EMS, family states that "on a good day patient can state her name and birthday".  They note that the patient had a recent UTI and was started on Macrobid yesterday and has had 3 doses.  Earlier today she was found to be more somnolent than normal and they noticed a new rash on her lower extremities.  EMS states that they gave her 50 mg of IV Benadryl as well is 400 cc of IV fluids with little improvement.  Due to her tachypnea they initiated code sepsis in the field.  Spoke to the patient's son at bedside.  He states that he and his brother are the POA for the patient.  Confirms that she is full code.  States that she was initially seen in the ED recently and diagnosed with a UTI and received IV antibiotics.  Per records, it appears that she was started empirically on Rocephin and then completed 3 days of meropenem.  She had a follow-up UA at her PCP which also appeared infectious which was why she was started on Macrobid yesterday.  States that he and her caregiver did not notice the rash on her legs last night and first noticed it this morning.  Level 5 caveat due to dementia.    Home Medications Prior to Admission medications   Medication Sig Start Date End Date Taking? Authorizing Provider  acetaminophen (TYLENOL) 500 MG tablet Take 1,000 mg by mouth every 6 (six) hours as needed for mild pain or headache.    Yes [provider]  alendronate (FOSAMAX) 70 MG tablet Take 1 tablet (70 mg  total) by mouth once a week. 01/20/22  Yes Ngetich, [name] C, NP  allopurinol (ZYLOPRIM) 100 MG tablet Take 1 tablet (100 mg total) by mouth daily. 02/10/22   Ngetich, [name] C, NP  atenolol (TENORMIN) 50 MG tablet Take 1 tablet (50 mg total) by mouth daily. 05/23/22   Ngetich, [name] C, NP  calcium carbonate (OS-CAL - DOSED IN MG OF ELEMENTAL CALCIUM) 1250 (500 Ca) MG tablet Take 1 tablet (1,250 mg total) by mouth in the morning. 01/18/22   Margie Billet, PA-C  cholecalciferol (VITAMIN D3) 25 MCG (1000 UT) tablet Take 2,000 Units by mouth daily.    [provider]  colchicine 0.6 MG tablet Take 2 tablets by mouth x 1 dose then one tablet daily x 3 days wait 12 hours prior to resuming Allopurinol 04/28/22   Ngetich, [name] C, NP  donepezil (ARICEPT) 10 MG tablet Take 1 tablet (10 mg total) by mouth at bedtime. 07/02/21   Suzzanne Cloud, NP  furosemide (LASIX) 40 MG tablet TAKE 40 MG DAILY ALTERNATING EVERY OTHER DAY WITH 60 MG (1 AND 1/2 TABLET)DAILY 02/10/22   [name], MD  gabapentin (NEURONTIN) 300 MG capsule TAKE 1 CAPSULE BY MOUTH THREE TIMES A DAY 02/24/22   Ngetich, [name] C, NP  ipratropium (ATROVENT) 0.06 % nasal spray CAN USE 2 SPRAYS IN EACH NOSTRIL EVERY  6 HOURS IF NEEDED TO DRY UP RUNNY N0SE 12/04/20   Kozlow, Donnamarie Poag, MD  memantine (NAMENDA) 5 MG tablet Take 1 tablet (5 mg total) by mouth 2 (two) times daily. 07/02/21   Suzzanne Cloud, NP  Menthol, Topical Analgesic, (ICY HOT EX) Apply 1 application. topically 2 (two) times daily as needed (Foot pain).    [provider]  metolazone (ZAROXOLYN) 2.5 MG tablet TAKE ONE TABLET BY MOUTH TWICE WEEKLY - 30 MINUTES PRIOR TO TAKING YOUR 60MG DOSE OF FUROSEMIDE 04/10/22   [name], MD  Multiple Vitamins-Minerals (MULTIPLE VITAMINS/WOMENS PO) Take 1 tablet by mouth daily.    [provider]  nitrofurantoin (MACRODANTIN) 100 MG capsule Take 1 capsule (100 mg total) by mouth 2 (two) times daily. For 7 days. 05/28/22   Ngetich,  [name] C, NP  omeprazole (PRILOSEC) 20 MG capsule Take 1 capsule (20 mg total) by mouth daily. 05/09/22   Ngetich, [name] C, NP  potassium chloride SA (KLOR-CON M) 20 MEQ tablet Take 2 tablets (40 mEq total) by mouth daily. 05/28/22   Ngetich, [name] C, NP  Rivaroxaban (XARELTO) 15 MG TABS tablet TAKE 1 TABLET (15 MG TOTAL) BY MOUTH DAILY WITH SUPPER. 01/08/22   [name], MD  rosuvastatin (CRESTOR) 5 MG tablet Take 1 tablet (5 mg total) by mouth daily. 02/10/22   [name], MD  saccharomyces boulardii (FLORASTOR) 250 MG capsule Take 1 capsule (250 mg total) by mouth 2 (two) times daily. For 10 days. 05/28/22   Ngetich, [name] C, NP  traZODone (DESYREL) 50 MG tablet Take 50 mg by mouth at bedtime as needed for sleep.    [provider]  vitamin B-12 (CYANOCOBALAMIN) 1000 MCG tablet Take 1,000 mcg by mouth daily.    [provider]  vitamin C (ASCORBIC ACID) 250 MG tablet Take 250 mg by mouth daily.    [provider]      Allergies    Ciprofloxacin, Sulfa antibiotics, Memantine, Succinylsulphathiazole, and Sulfamethoxazole    Review of Systems   Review of Systems  Unable to perform ROS: Dementia   Updated Vital Signs BP 126/67   Pulse (!) 101   Temp 99.3 F (37.4 C) (Oral)   Resp 16   SpO2 (!) 84%  Physical Exam Vitals and nursing note reviewed.  Constitutional:      General: She is not in acute distress.    Appearance: Normal appearance. She is not ill-appearing, toxic-appearing or diaphoretic.  HENT:     Head: Normocephalic and atraumatic.     Right Ear: External ear normal.     Left Ear: External ear normal.     Nose: Nose normal.     Mouth/Throat:     Mouth: Mucous membranes are moist.     Pharynx: Oropharynx is clear. No oropharyngeal exudate or posterior oropharyngeal erythema.  Eyes:     General: No scleral icterus.       Right eye: No discharge.        Left eye: No discharge.     Extraocular Movements: Extraocular movements intact.      Conjunctiva/sclera: Conjunctivae normal.  Cardiovascular:     Rate and Rhythm: Tachycardia present. Rhythm irregular.     Pulses: Normal pulses.     Heart sounds: Normal heart sounds. No murmur heard.   No friction rub. No gallop.  Pulmonary:     Effort: Pulmonary effort is normal. No respiratory distress.     Breath sounds: Normal breath sounds.  No stridor. No wheezing, rhonchi or rales.  Abdominal:     General: Abdomen is flat.     Palpations: Abdomen is soft.     Tenderness: There is abdominal tenderness.     Comments: Abdomen is flat and soft.  Patient exhibits signs of tenderness with palpation of the abdomen.  Musculoskeletal:        General: Normal range of motion.     Cervical back: Normal range of motion and neck supple. No tenderness.  Skin:    General: Skin is warm and dry.     Findings: Erythema present.     Comments: Nonblanching erythema noted to the bilateral lower extremities.  Please see image below.  Neurological:     Comments: History of advanced dementia.  Patient is able to tell me her name but otherwise is not providing answers to questions or following commands.  Psychiatric:        Mood and Affect: Mood normal.        Behavior: Behavior normal.    ED Results / Procedures / Treatments   Labs (all labs ordered are listed, but only abnormal results are displayed) Labs Reviewed  LACTIC ACID, PLASMA - Abnormal; Notable for the following components:      Result Value   Lactic Acid, Venous 2.7 (*)    All other components within normal limits  LACTIC ACID, PLASMA - Abnormal; Notable for the following components:   Lactic Acid, Venous 2.3 (*)    All other components within normal limits  COMPREHENSIVE METABOLIC PANEL - Abnormal; Notable for the following components:   Sodium 133 (*)    Chloride 88 (*)    Glucose, Bld 123 (*)    BUN 59 (*)    Creatinine, Ser 1.94 (*)    Total Protein 8.2 (*)    Total Bilirubin 1.9 (*)    GFR, Estimated 24 (*)    All other  components within normal limits  URINALYSIS, ROUTINE W REFLEX MICROSCOPIC - Abnormal; Notable for the following components:   APPearance HAZY (*)    Hgb urine dipstick SMALL (*)    Leukocytes,Ua LARGE (*)    Bacteria, UA MANY (*)    All other components within normal limits  CBC - Abnormal; Notable for the following components:   WBC 13.8 (*)    Hemoglobin 11.3 (*)    HCT 35.7 (*)    RDW 19.1 (*)    All other components within normal limits  PROTIME-INR - Abnormal; Notable for the following components:   Prothrombin Time 32.5 (*)    INR 3.2 (*)    All other components within normal limits  APTT - Abnormal; Notable for the following components:   aPTT 42 (*)    All other components within normal limits  LACTATE DEHYDROGENASE - Abnormal; Notable for the following components:   LDH 194 (*)    All other components within normal limits  DIFFERENTIAL - Abnormal; Notable for the following components:   Neutro Abs 13.0 (*)    Lymphs Abs 0.3 (*)    Abs Immature Granulocytes 0.08 (*)    All other components within normal limits  CULTURE, BLOOD (ROUTINE X 2)  CULTURE, BLOOD (ROUTINE X 2)  URINE CULTURE  CBC WITH DIFFERENTIAL/PLATELET  PLATELET COUNT   EKG EKG Interpretation  Date/Time:  Thursday May 29 2022 15:18:38 EDT Ventricular Rate:  108 PR Interval:    QRS Duration: 158 QT Interval:  419 QTC Calculation: 562 R Axis:   124 Text  Interpretation: Atrial fibrillation Ventricular premature complex Right bundle branch block Artifact in lead(s) I II III aVR aVL aVF V1 V4 Abnormal ECG Confirmed by Carmin Muskrat [phone]) on 05/29/2022 5:16:55 PM  Radiology CT HEAD WO CONTRAST (5MM)  Result Date: 05/29/2022 CLINICAL DATA:  Delirium.  Altered level of consciousness. EXAM: CT HEAD WITHOUT CONTRAST TECHNIQUE: Contiguous axial images were obtained from the base of the skull through the vertex without intravenous contrast. RADIATION DOSE REDUCTION: This exam was performed according to the  departmental dose-optimization program which includes automated exposure control, adjustment of the mA and/or kV according to patient size and/or use of iterative reconstruction technique. COMPARISON:  Head CT and brain MRI 05/18/2022 FINDINGS: Brain: No acute intracranial hemorrhage. Stable atrophy. Advanced periventricular and deep chronic small vessel ischemia. Multiple remote cerebral and cerebellar chronic infarcts are unchanged from prior exam. No evidence of acute ischemia, although degree of background chronic change limits detailed assessment. No subdural or extra-axial collection. Vascular: Atherosclerosis of skullbase vasculature without hyperdense vessel or abnormal calcification. Skull: No fracture or focal lesion. Sinuses/Orbits: No acute findings.  Bilateral cataract resection. Other: None. IMPRESSION: 1. No acute intracranial abnormality. 2. Stable atrophy and advanced chronic small vessel ischemia. Multiple remote cerebral and cerebellar infarcts are unchanged from prior exam. Electronically Signed   By: Keith Rake M.D.   On: 05/29/2022 18:00   DG Chest Port 1 View  Result Date: 05/29/2022 CLINICAL DATA:  Possible sepsis EXAM: PORTABLE CHEST 1 VIEW COMPARISON:  Prior chest x-ray 05/18/2022 FINDINGS: Cardiomegaly. No edema, focal airspace infiltrate, pleural effusion or pneumothorax. No acute osseous abnormality. IMPRESSION: Stable chest x-ray and cardiomegaly without evidence of pneumonia. Electronically Signed   By: Jacqulynn Cadet M.D.   On: 05/29/2022 15:48    Procedures Procedures   Medications Ordered in ED Medications  ampicillin (OMNIPEN) 2 g in sodium chloride 0.9 % 100 mL IVPB (has no administration in time range)  lactated ringers bolus 500 mL (has no administration in time range)  lactated ringers bolus 1,000 mL (1,000 mLs Intravenous New Bag/Given 05/29/22 1726)   ED Course/ Medical Decision Making/ A&P Clinical Course as of 05/29/22 2036  Thu May 29, 2022  1943  Bacteria, UA(!): MANY [LJ]  2009 WBC, UA: 11-20 [LJ]  2009 Leukocytes,Ua(!): LARGE [LJ]  2016 Last urine culture on May 23, 2022 shows greater than 100,000 CFU's.  Appears to be growing Enterococcus faecium.  And appears sensitive to nitrofurantoin, ampicillin, as well as vancomycin.  Discussed with the on-call pharmacist at [facility] who recommends that we initiate ampicillin and avoid nitrofurantoin given patient's renal function.  Recommends 1 to 2 g twice daily. [LJ]    Clinical Course User Index [LJ] Rayna Sexton, PA-C                           Medical Decision Making Amount and/or Complexity of Data Reviewed Labs: ordered. Decision-making details documented in ED Course. Radiology: ordered. ECG/medicine tests: ordered.  Risk Decision regarding hospitalization.   Pt is a 88 y.o. female with a history of advanced dementia who presents to the emergency department due to worsening altered mental status as well as an erythematous rash to the lower extremities.  Please see image above.  Labs: CBC with a white count of 13.8, hemoglobin 11.3, RDW of 19.1, platelet clumping, neutrophils of 13, absolute immature granulocytes of 0.08. APTT of 42. Lactic acid 2.7. LDH 194. CMP with a sodium of 133, chloride of 88,  glucose of 123, BUN of 59, creatinine 1.94, total protein of 8.2, total bilirubin 1.9, GFR 24. UA shows small hemoglobin, large leukocytes, 11-20 white blood cells, many bacteria.. Blood cultures obtained. Urine culture obtained.   Imaging: Chest x-ray shows a stable chest x-ray and cardiomegaly without evidence of pneumonia. CT scan of the head without contrast shows no acute intracranial abnormality.  Stable atrophy and advanced chronic small vessel ischemia.  Multiple remote cerebral and cerebellar infarcts are unchanged from prior exam.  I, Rayna Sexton, PA-C, personally reviewed and evaluated these images and lab results as part of my medical  decision-making.  On my exam patient is A&O x1.  She can tell me her name but otherwise does not provide any clear answers to questions or follow commands.  Has a history of advanced dementia.  Per records as well as her son at bedside who acts as her POA, she was admitted and April and at that time had a urine culture that grew ESBL Klebsiella.  She was initially started on Rocephin which was then discontinued and she was given 3 days of meropenem.  She has been evaluated by her PCP multiple times since then and has had 2 positive urine cultures that grew Enterococcus faecium.  She was started on Macrobid yesterday and then today became more altered and began showing signs of a nonblanching rash to the lower extremities.  Please see the image above.  Patient found to have an elevated lactic acid of 2.7.  She was given 1 L of IV fluids and this improved to 2.3.  Additional 500 cc have been ordered.  UA still appears infectious.  Urine culture sent.  Leukocytosis of 13.8.  Based on her most recent urine culture on May 23, 2022 we will start patient on 2 g of ampicillin twice daily.  This was discussed with our on-call pharmacist, Dimple Nanas [facility], who was in agreement.  Unsure the source of the rash on her lower extremities.  Appears to nonblanching and not consistent with cellulitis.  Palpable pedal pulses.  Possibly a medication reaction?  Her platelets were clumping on CBC and are not calculable.  Her repeat platelets have been sent to the pharmacy and are pending.  Discussed admission with her son bedside who is in agreement with this plan.  We will discuss with the medicine team at this time.  Note: Portions of this report may have been transcribed using voice recognition software. Every effort was made to ensure accuracy; however, inadvertent computerized transcription errors may be present.   Final Clinical Impression(s) / ED Diagnoses Final diagnoses:  Acute cystitis with hematuria   Altered mental status, unspecified altered mental status type  Rash   Rx / DC Orders ED Discharge Orders     None         Rayna Sexton, PA-C 05/29/22 2039    Charlesetta Shanks, MD 05/30/22 1143

## 2022-05-29 NOTE — Assessment & Plan Note (Signed)
(  please see acute cystitis)

## 2022-05-30 ENCOUNTER — Other Ambulatory Visit: Payer: Self-pay

## 2022-05-30 DIAGNOSIS — G9341 Metabolic encephalopathy: Secondary | ICD-10-CM | POA: Diagnosis not present

## 2022-05-30 DIAGNOSIS — E876 Hypokalemia: Secondary | ICD-10-CM

## 2022-05-30 DIAGNOSIS — N3001 Acute cystitis with hematuria: Secondary | ICD-10-CM

## 2022-05-30 DIAGNOSIS — R21 Rash and other nonspecific skin eruption: Secondary | ICD-10-CM | POA: Diagnosis not present

## 2022-05-30 LAB — COMPREHENSIVE METABOLIC PANEL
ALT: 15 U/L (ref 0–44)
AST: 27 U/L (ref 15–41)
Albumin: 3 g/dL — ABNORMAL LOW (ref 3.5–5.0)
Alkaline Phosphatase: 76 U/L (ref 38–126)
Anion gap: 11 (ref 5–15)
BUN: 53 mg/dL — ABNORMAL HIGH (ref 8–23)
CO2: 30 mmol/L (ref 22–32)
Calcium: 8.6 mg/dL — ABNORMAL LOW (ref 8.9–10.3)
Chloride: 94 mmol/L — ABNORMAL LOW (ref 98–111)
Creatinine, Ser: 1.59 mg/dL — ABNORMAL HIGH (ref 0.44–1.00)
GFR, Estimated: 31 mL/min — ABNORMAL LOW (ref >60)
Glucose, Bld: 102 mg/dL — ABNORMAL HIGH (ref 70–99)
Potassium: 3.3 mmol/L — ABNORMAL LOW (ref 3.5–5.1)
Sodium: 135 mmol/L (ref 135–145)
Total Bilirubin: 1.5 mg/dL — ABNORMAL HIGH (ref 0.3–1.2)
Total Protein: 6.5 g/dL (ref 6.5–8.1)

## 2022-05-30 LAB — RAPID URINE DRUG SCREEN, HOSP PERFORMED
Amphetamines: NOT DETECTED
Barbiturates: NOT DETECTED
Benzodiazepines: NOT DETECTED
Cocaine: NOT DETECTED
Opiates: NOT DETECTED
Tetrahydrocannabinol: NOT DETECTED

## 2022-05-30 LAB — TECHNOLOGIST SMEAR REVIEW

## 2022-05-30 LAB — CBC WITH DIFFERENTIAL/PLATELET
Abs Immature Granulocytes: 0.05 10*3/uL (ref 0.00–0.07)
Basophils Absolute: 0 10*3/uL (ref 0.0–0.1)
Basophils Relative: 0 %
Eosinophils Absolute: 0.3 10*3/uL (ref 0.0–0.5)
Eosinophils Relative: 3 %
HCT: 31.1 % — ABNORMAL LOW (ref 36.0–46.0)
Hemoglobin: 10 g/dL — ABNORMAL LOW (ref 12.0–15.0)
Immature Granulocytes: 1 %
Lymphocytes Relative: 4 %
Lymphs Abs: 0.4 10*3/uL — ABNORMAL LOW (ref 0.7–4.0)
MCH: 28.9 pg (ref 26.0–34.0)
MCHC: 32.2 g/dL (ref 30.0–36.0)
MCV: 89.9 fL (ref 80.0–100.0)
Monocytes Absolute: 0.4 10*3/uL (ref 0.1–1.0)
Monocytes Relative: 4 %
Neutro Abs: 7.9 10*3/uL — ABNORMAL HIGH (ref 1.7–7.7)
Neutrophils Relative %: 88 %
Platelets: 158 10*3/uL (ref 150–400)
RBC: 3.46 MIL/uL — ABNORMAL LOW (ref 3.87–5.11)
RDW: 18.8 % — ABNORMAL HIGH (ref 11.5–15.5)
WBC: 9.1 10*3/uL (ref 4.0–10.5)
nRBC: 0 % (ref 0.0–0.2)

## 2022-05-30 LAB — LACTATE DEHYDROGENASE: LDH: 192 U/L (ref 98–192)

## 2022-05-30 LAB — PROTIME-INR
INR: 2.7 — ABNORMAL HIGH (ref 0.8–1.2)
Prothrombin Time: 28.5 seconds — ABNORMAL HIGH (ref 11.4–15.2)

## 2022-05-30 LAB — APTT: aPTT: 44 seconds — ABNORMAL HIGH (ref 24–36)

## 2022-05-30 LAB — BILIRUBIN, DIRECT: Bilirubin, Direct: 0.4 mg/dL — ABNORMAL HIGH (ref 0.0–0.2)

## 2022-05-30 LAB — TSH: TSH: 2.381 u[IU]/mL (ref 0.350–4.500)

## 2022-05-30 LAB — CK: Total CK: 74 U/L (ref 38–234)

## 2022-05-30 LAB — LACTIC ACID, PLASMA: Lactic Acid, Venous: 1.5 mmol/L (ref 0.5–1.9)

## 2022-05-30 LAB — MAGNESIUM: Magnesium: 2.6 mg/dL — ABNORMAL HIGH (ref 1.7–2.4)

## 2022-05-30 LAB — AMMONIA: Ammonia: 36 umol/L — ABNORMAL HIGH (ref 9–35)

## 2022-05-30 MED ORDER — ATENOLOL 50 MG PO TABS
50.0000 mg | ORAL_TABLET | Freq: Every day | ORAL | Status: DC
  Administered 2022-05-30 – 2022-06-09 (×10): 50 mg via ORAL
  Filled 2022-05-30 (×10): qty 1

## 2022-05-30 MED ORDER — CALCIUM CARBONATE 1250 (500 CA) MG PO TABS
1250.0000 mg | ORAL_TABLET | Freq: Every day | ORAL | Status: DC
  Administered 2022-05-31 – 2022-06-03 (×4): 1250 mg via ORAL
  Filled 2022-05-30 (×4): qty 1

## 2022-05-30 MED ORDER — LACTATED RINGERS IV SOLN: INTRAVENOUS | Status: DC

## 2022-05-30 MED ORDER — DONEPEZIL HCL 10 MG PO TABS
10.0000 mg | ORAL_TABLET | Freq: Every day | ORAL | Status: DC
  Administered 2022-05-30 – 2022-06-09 (×11): 10 mg via ORAL
  Filled 2022-05-30 (×11): qty 1

## 2022-05-30 MED ORDER — MUSCLE RUB 10-15 % EX CREA
TOPICAL_CREAM | CUTANEOUS | Status: DC | PRN
  Filled 2022-05-30: qty 85

## 2022-05-30 MED ORDER — RIVAROXABAN 15 MG PO TABS
15.0000 mg | ORAL_TABLET | Freq: Every day | ORAL | Status: DC
  Administered 2022-05-30 – 2022-06-03 (×5): 15 mg via ORAL
  Filled 2022-05-30 (×5): qty 1

## 2022-05-30 MED ORDER — PANTOPRAZOLE SODIUM 40 MG PO TBEC
40.0000 mg | DELAYED_RELEASE_TABLET | Freq: Every day | ORAL | Status: DC
  Administered 2022-05-30 – 2022-06-09 (×10): 40 mg via ORAL
  Filled 2022-05-30 (×11): qty 1

## 2022-05-30 MED ORDER — ALLOPURINOL 100 MG PO TABS
100.0000 mg | ORAL_TABLET | Freq: Every day | ORAL | Status: DC
  Administered 2022-05-30 – 2022-06-09 (×10): 100 mg via ORAL
  Filled 2022-05-30 (×10): qty 1

## 2022-05-30 MED ORDER — MEMANTINE HCL 10 MG PO TABS
5.0000 mg | ORAL_TABLET | Freq: Two times a day (BID) | ORAL | Status: DC
  Administered 2022-05-30 – 2022-06-09 (×21): 5 mg via ORAL
  Filled 2022-05-30 (×21): qty 1

## 2022-05-30 MED ORDER — POTASSIUM CHLORIDE CRYS ER 20 MEQ PO TBCR: 40.0000 meq | EXTENDED_RELEASE_TABLET | Freq: Once | ORAL | Status: DC

## 2022-05-30 MED ORDER — POTASSIUM CHLORIDE 10 MEQ/100ML IV SOLN
10.0000 meq | INTRAVENOUS | Status: AC
  Administered 2022-05-30 (×3): 10 meq via INTRAVENOUS
  Filled 2022-05-30 (×3): qty 100

## 2022-05-30 MED ORDER — SACCHAROMYCES BOULARDII 250 MG PO CAPS
250.0000 mg | ORAL_CAPSULE | Freq: Two times a day (BID) | ORAL | Status: DC
  Administered 2022-05-30 – 2022-06-09 (×20): 250 mg via ORAL
  Filled 2022-05-30 (×21): qty 1

## 2022-05-30 MED ORDER — POTASSIUM CHLORIDE CRYS ER 20 MEQ PO TBCR
40.0000 meq | EXTENDED_RELEASE_TABLET | Freq: Every day | ORAL | Status: DC
  Administered 2022-05-31 – 2022-06-03 (×4): 40 meq via ORAL
  Filled 2022-05-30 (×4): qty 2

## 2022-05-30 MED ORDER — ROSUVASTATIN CALCIUM 5 MG PO TABS
5.0000 mg | ORAL_TABLET | Freq: Every day | ORAL | Status: DC
  Administered 2022-05-30 – 2022-06-09 (×9): 5 mg via ORAL
  Filled 2022-05-30 (×10): qty 1

## 2022-05-30 MED ORDER — VITAMIN D 25 MCG (1000 UNIT) PO TABS
2000.0000 [IU] | ORAL_TABLET | Freq: Every day | ORAL | Status: DC
  Administered 2022-05-30 – 2022-06-09 (×9): 2000 [IU] via ORAL
  Filled 2022-05-30 (×10): qty 2

## 2022-05-30 NOTE — Progress Notes (Addendum)
PROGRESS NOTE   KODIE PICK  UKR:[id number]    DOB: 29-Jul-1932    DOA: 05/29/2022  PCP: [name], NP   I have briefly reviewed patients previous medical records in [facility].  Chief Complaint  Patient presents with   Medication Reaction    Brief Narrative:  88 year old female, lives with her sons who between them are able to provide 24/7 supervision and assistance, ambulates with a walker but lately has been using a wheelchair, medical history significant for moderate to severe dementia, chronic anemia, chronic right heart failure, hyperlipidemia, hypertension, permanent atrial fibrillation chronically on Xarelto, stage IV CKD with baseline creatinine 1.5-2, at baseline does okay in the daytime, more confused in the nights, unable to consistently recognize family, presented with complaints of 4 to 5 days history of worsening confusion and somnolence.  Seen by PCP and started on Macrobid for Enterococcus faecalis UTI, after 2 doses, the next day noted significant bilateral lower extremity rash and no significant improvement in mental status, presented to ED.  Per family, AMS similar to when she had prior episodes of UTI.  Admitted for acute metabolic encephalopathy and suspected acute cystitis.   Assessment & Plan:  Principal Problem:   Acute metabolic encephalopathy Active Problems:   Permanent atrial fibrillation (HCC)   Hypertension   Hyperlipidemia   Acute cystitis   Rash   Severe sepsis (HCC)   Chronic systolic CHF (congestive heart failure) (HCC)   Acute metabolic encephalopathy: Due to acute cystitis, dehydration, AKI complicating underlying advanced dementia.  Prior history of same. Treat underlying cause and monitor. Delirium precautions. CT head without acute findings. UDS negative.  Ammonia unremarkable.  Enterococcus faecalis acute cystitis: Urine culture 5/26 showed this and sensitive to ampicillin, vancomycin and nitrofurantoin. Continue IV  ampicillin for 24 to 48 hours and then consider transitioning to oral. Follow-up outstanding blood and urine culture results. Severe sepsis documented in H&P.  However on review of chart carefully, on admission, she had no fever, tachycardia, tachypnea but had leukocytosis and lactate elevation.  I do not agree with severe sepsis diagnosis.  Sepsis ruled out.  Bilateral legs rash: Reportedly started overnight after initiating Macrobid. Possible Macrobid allergy-added to allergy list Improving per family, monitor without intervention.  Dehydration: Although son reports good oral intake, appears somewhat dehydrated.  Also on diuretics at home. Continue gentle IV fluids for 24 hours.  P.o. intake as tolerated.  Son denies issues with swallowing.  Soft diet.  Acute kidney injury complicating stage IIIb CKD Baseline creatinine probably in the 1.5-1.7 range. Presented with creatinine of 1.9. Continue gentle IV fluids and follow BMP.  Creatinine already improved to 1.59.  Elevated lactate: Secondary to dehydration and AKI.  Resolved.  Hypokalemia: Replace and follow.  Magnesium 2.6.  Chronic anemia: Stable.  Chronic right heart failure:  Clinically dehydrated.  Holding Lasix and metolazone. Monitor closely while on gentle IV fluids.  Hyperlipidemia: Continue statins  Permanent A-fib Continue home dose of atenolol and Xarelto.  Discussed extensively with son that if she has frequent falls then her outpatient providers will need to carefully weigh the benefits of anticoagulation versus risks of catastrophic bleeding.  He verbalized understanding.  Essential hypertension: Continue atenolol.  Advanced dementia: Reportedly used to follow-up with [facility] and has been released. Continue Aricept and Namenda.  Falls at home: PT evaluation.  There is no height or weight on file to calculate BMI.   DVT prophylaxis: SCDs Start: 05/29/22 2042  Code Status: Full  Code:  Family Communication: Son in detail at bedside. Disposition:  Status is: Inpatient Possible DC home in 2 to 3 days.     Consultants:   None  Procedures:   None  Antimicrobials:   IV ampicillin  Subjective:  Seen this morning while still in ED.  Son at bedside.  [name] female RN in the room during interview and exam.  Per son, mental status somewhat better than on admission and so also bilateral leg rash.  Rest of history as noted above.  Patient sleeping but arousable, oriented only to self, calls her son as brother.    Objective:   Vitals:   05/30/22 0530 05/30/22 0615 05/30/22 0900 05/30/22 0912  BP: 125/79  122/63   Pulse: 85  93 88  Resp: _0 Temp:  (!) 100.6 F (38.1 C)    TempSrc:  Rectal    SpO2: 98%  95% 91%    General exam: Very elderly female, moderately built and frail, lying comfortably propped up in bed without distress.  Oral mucosa dry. Respiratory system: Clear to auscultation. Respiratory effort normal. Cardiovascular system: S1 & S2 heard, RRR. No JVD, murmurs, rubs, gallops or clicks. No pedal edema.  Telemetry personally reviewed: Sinus rhythm. Gastrointestinal system: Abdomen is nondistended, soft and nontender. No organomegaly or masses felt. Normal bowel sounds heard. Central nervous system: Sleeping and arousable easily, oriented only to self. No focal neurological deficits. Extremities: Symmetric 5 x 5 power. Skin: Bilateral leg with significant erythematous?  Petechial rash up to the knees as noted in picture below from 6/2. Psychiatry: Judgement and insight impaired. Mood & affect appropriate.      Data Reviewed:   I have personally reviewed following labs and imaging studies   CBC: Recent Labs  Lab 05/23/22 1442 05/29/22 1630 05/29/22 2031 05/30/22 0422  WBC 7.4 13.8*  --  9.1  NEUTROABS 5,705 13.0*  --  7.9*  HGB 10.8* 11.3*  --  10.0*  HCT 33.0* 35.7*  --  31.1*  MCV 88.7 91.3  --  89.9  PLT 108* PLATELET  CLUMPS NOTED ON SMEAR, UNABLE TO ESTIMATE PLATELET CLUMPS NOTED ON SMEAR, UNABLE TO ESTIMATE 179    Basic Metabolic Panel: Recent Labs  Lab 05/23/22 1442 05/29/22 1536 05/29/22 1727 05/30/22 0422  NA 127* 133*  --  135  K 2.8* 3.5  --  3.3*  CL 81* 88*  --  94*  CO2 31 30  --  30  GLUCOSE 88 123*  --  102*  BUN 50* 59*  --  53*  CREATININE 1.50* 1.94*  --  1.59*  CALCIUM 9.4 9.1  --  8.6*  MG  --   --  2.4 2.6*    Liver Function Tests: Recent Labs  Lab 05/23/22 1442 05/29/22 1536 05/30/22 0422  AST _1 ALT _2 ALKPHOS  --  106 76  BILITOT 1.0 1.9* 1.5*  PROT 6.9 8.2* 6.5  ALBUMIN  --  3.9 3.0*    CBG: No results for input(s): GLUCAP in the last 168 hours.  Microbiology Studies:   Recent Results (from the past 240 hour(s))  Urine Culture     Status: Abnormal   Collection Time: 05/23/22  3:48 PM   Specimen: Urine  Result Value Ref Range Status   MICRO NUMBER: [id number]  Final   SPECIMEN QUALITY: Adequate  Final   Sample Source URINE, CLEAN CATCH  Final  STATUS: FINAL  Final   ISOLATE 1: Enterococcus faecium (A)  Final    Comment: Greater than 100,000 CFU/mL of Enterococcus faecium   COMMENT:   Final    Additional non-predominating organism(s) isolated. These organisms, commonly found on external and internal genitalia, are considered colonizers. No further testing performed.      Susceptibility   Enterococcus faecium - URINE CULTURE POSITIVE 1    AMPICILLIN <=2 Sensitive     VANCOMYCIN <=0.5 Sensitive     NITROFURANTOIN* 32 Sensitive      * Legend: S = Susceptible  I = Intermediate R = Resistant  NS = Not susceptible * = Not tested  NR = Not reported **NN = See antimicrobic comments   Blood Culture (routine x 2)     Status: None (Preliminary result)   Collection Time: 05/29/22  3:36 PM   Specimen: BLOOD  Result Value Ref Range Status   Specimen Description   Final    BLOOD BLOOD LEFT ARM Performed at [facility] [address], [place], [place] [address]    Special Requests   Final    BOTTLES DRAWN AEROBIC AND ANAEROBIC Blood Culture results may not be optimal due to an inadequate volume of blood received in culture bottles Performed at [facility] [address], [place], [place] [address]    Culture   Final    NO GROWTH < 24 HOURS Performed at [facility] [address] [place], [place] [address]    Report Status PENDING  Incomplete  Blood Culture (routine x 2)     Status: None (Preliminary result)   Collection Time: 05/29/22  5:03 PM   Specimen: BLOOD  Result Value Ref Range Status   Specimen Description   Final    BLOOD BLOOD RIGHT FOREARM Performed at [facility] [address] [place], [place] [address]    Special Requests   Final    BOTTLES DRAWN AEROBIC AND ANAEROBIC Blood Culture results may not be optimal due to an inadequate volume of blood received in culture bottles Performed at [facility] [address] [place], [place] [address]    Culture   Final    NO GROWTH < 12 HOURS Performed at [facility] [address], [place],  [address]    Report Status PENDING  Incomplete    Radiology Studies:  CT HEAD WO CONTRAST (5MM)  Result Date: 05/29/2022 CLINICAL DATA:  Delirium.  Altered level of consciousness. EXAM: CT HEAD WITHOUT CONTRAST TECHNIQUE: Contiguous axial images were obtained from the base of the skull through the vertex without intravenous contrast. RADIATION DOSE REDUCTION: This exam was performed according to the departmental dose-optimization program which includes automated exposure control, adjustment of the mA and/or kV according to patient size and/or use of iterative reconstruction technique. COMPARISON:  Head CT and brain MRI 05/18/2022 FINDINGS: Brain: No acute intracranial hemorrhage. Stable atrophy. Advanced periventricular and deep chronic small vessel ischemia. 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 No evidence of acute ischemia, although degree of background chronic change limits detailed assessment. No subdural or extra-axial collection. Vascular: Atherosclerosis of skullbase vasculature without hyperdense vessel or abnormal calcification. Skull: No fracture or focal lesion. Sinuses/Orbits: No acute findings.  Bilateral cataract resection. Other: None. IMPRESSION: 1. No acute intracranial abnormality. 2. Stable atrophy and advanced chronic small vessel ischemia. Multiple remote cerebral and cerebellar infarcts are unchanged from prior exam.  Electronically Signed   By: Keith Rake M.D.   On: 05/29/2022 18:00   DG Chest Port 1 View  Result Date: 05/29/2022 CLINICAL DATA:  Possible sepsis EXAM: PORTABLE CHEST 1 VIEW COMPARISON:  Prior chest x-ray 05/18/2022 FINDINGS: Cardiomegaly. No edema, focal airspace infiltrate, pleural effusion or pneumothorax. No acute osseous abnormality. IMPRESSION: Stable chest x-ray and cardiomegaly without evidence of pneumonia. Electronically Signed   By: Jacqulynn Cadet M.D.   On: 05/29/2022 15:48    Scheduled Meds:    allopurinol  100 mg Oral Daily   atenolol  50 mg Oral Daily   calcium carbonate  1,250 mg Oral q AM   cholecalciferol  2,000 Units Oral Daily   donepezil  10 mg Oral QHS   memantine  5 mg Oral BID   pantoprazole  40 mg Oral Daily   [START ON 05/31/2022] potassium chloride SA  40 mEq Oral Daily   Rivaroxaban  15 mg Oral Q supper   rosuvastatin  5 mg Oral Daily   saccharomyces boulardii  250 mg Oral BID    Continuous Infusions:    ampicillin (OMNIPEN) IV Stopped (05/30/22 0013)   lactated ringers 50 mL/hr at 05/30/22 0819     LOS: 1 day     Vernell Leep, MD,  FACP, [facility], [facility], [facility] (Care Management Physician Certified) [place]  To contact the attending provider between 7A-7P or the covering provider during after  hours 7P-7A, please log into the web site [email or web address] and access using universal [place] password for that web site. If you do not have the password, please call the hospital operator.  05/30/2022, 10:32 AM

## 2022-05-30 NOTE — ED Notes (Signed)
Upon checking for bowel movement, pt had no bowel movement present. Pt placed in hospital gown, new brief applied, pt repositioned to comfort.

## 2022-05-30 NOTE — Telephone Encounter (Signed)
[name], son called and stated that they admitted patient to [facility]

## 2022-05-30 NOTE — Plan of Care (Signed)
  Problem: Education: Goal: Knowledge of General Education information will improve Description: Including pain rating scale, medication(s)/side effects and non-pharmacologic comfort measures Outcome: Progressing   Problem: Safety: Goal: Ability to remain free from injury will improve Outcome: Progressing

## 2022-05-30 NOTE — Progress Notes (Signed)
PT Cancellation Note  Patient Details Name: [name] Dyer MRN: [id number] DOB: [birth year]   Cancelled Treatment:    Reason Eval/Treat Not Completed: Patient declined, no reason specified (Pt eating and daughter requesting return at later time.) Will follow up at schedule and pt status allows.  Coolidge Breeze, PT, DPT [facility] Department Office: [phone] Pager: [phone]  Coolidge Breeze 05/30/2022, 2:21 PM

## 2022-05-30 NOTE — Telephone Encounter (Signed)
Noted

## 2022-05-30 NOTE — Evaluation (Signed)
Clinical/Bedside Swallow Evaluation Patient Details  Name: [name] Dyer MRN: [id number] Date of Birth: 08-20-1932  Today's Date: 05/30/2022 Time: SLP Start Time (ACUTE ONLY): 94 SLP Stop Time (ACUTE ONLY): 0160 SLP Time Calculation (min) (ACUTE ONLY): 27 min  Past Medical History:  Past Medical History:  Diagnosis Date   Alzheimer disease ([place])    Anemia    Arrhythmia    Cataract    Colon polyps    Community acquired pneumonia 01/2012   LLL   Congestive heart failure ([place])    Hyperlipidemia    Hypertension    Memory difficulty 10/01/2016   Osteoporosis    Permanent atrial fibrillation (HCC)    Pleural effusion, right 01/2012   SVT (supraventricular tachycardia) ([place]) 01/2012   UTI (lower urinary tract infection)    Past Surgical History:  Past Surgical History:  Procedure Laterality Date   [place]   COLONOSCOPY     COLONOSCOPY N/A 06/10/2016   Procedure: COLONOSCOPY;  Surgeon: Manus Gunning, MD;  Location: [facility];  Service: Gastroenterology;  Laterality: N/A;   ESOPHAGOGASTRODUODENOSCOPY N/A 06/10/2016   Procedure: ESOPHAGOGASTRODUODENOSCOPY (EGD);  Surgeon: Manus Gunning, MD;  Location: [facility];  Service: Gastroenterology;  Laterality: N/A;   HEMIARTHROPLASTY HIP  2011   Left femoral neck fracture   RIGHT/LEFT HEART CATH AND CORONARY ANGIOGRAPHY N/A 01/08/2022   Procedure: RIGHT/LEFT HEART CATH AND CORONARY ANGIOGRAPHY;  Surgeon: Leonie Man, MD;  Location: [facility] LAB;  Service: Cardiovascular;  Laterality: N/A;   UPPER GASTROINTESTINAL ENDOSCOPY     HPI:  88 year old female, lives with her sons admitted worsening confusion and somnolence. Found to have metabolic encephalopathy and suspected acute cystitis. PMH:  moderate to severe dementia, chronic anemia, chronic right heart failure, hyperlipidemia, hypertension, permanent atrial fibrillation chronically, stage IV CKD.  Pt had MBS in  1/23 "[name] oropharyngeal swallowing is functional with adequate efficiency and no aspiration and occasional throat clearing/cough with clear airway.  BSE 04/27/22 + coughing (dry) with possibility of esophageal dysphagia recommended MBS however not charted after 04/27/22( possibly discharged prior to scheduling).    Assessment / Plan / Recommendation  Clinical Impression  Pt presents similar to BSE from 04/27/22. There were dry immediate and delayed coughs and throat clears with all consistencies except solid. Timing of swallow subjectively judged to be adequate. Only instance suspicious for penetration/aspiration was with nectar thick. Subtle eructation several times. Family reports doctor has recently placed pt on Protonix and family has not seen improvements as of yet. Suspect clinical observations may be indicative of esophageal involvement however unable to determine without instrumentation. Recommend continue Dys 3, thin liquids, pills with thin (family and RN reported no difficulty with pills with water) and MBS when able to perform. SLP Visit Diagnosis: Dysphagia, unspecified (R13.10)    Aspiration Risk  Mild aspiration risk    Diet Recommendation Dysphagia 3 (Mech soft);Thin liquid   Liquid Administration via: Cup;Straw Medication Administration: Whole meds with liquid Supervision: Staff to assist with self feeding Compensations: Minimize environmental distractions;Slow rate;Small sips/bites Postural Changes: Seated upright at 90 degrees    Other  Recommendations Oral Care Recommendations: Oral care BID    Recommendations for follow up therapy are one component of a multi-disciplinary discharge planning process, led by the attending physician.  Recommendations may be updated based on patient status, additional functional criteria and insurance authorization.  Follow up Recommendations  (TBD)      Assistance Recommended at Discharge  (  TBD)  Functional Status Assessment Patient has had  a recent decline in their functional status and demonstrates the ability to make significant improvements in function in a reasonable and predictable amount of time.  Frequency and Duration min 2x/week  2 weeks       Prognosis Prognosis for Safe Diet Advancement:  (fair-good) Barriers to Reach Goals: Cognitive deficits      Swallow Study   General Date of Onset: 05/29/22 HPI: 88 year old female, lives with her sons admitted worsening confusion and somnolence. Found to have metabolic encephalopathy and suspected acute cystitis. PMH:  moderate to severe dementia, chronic anemia, chronic right heart failure, hyperlipidemia, hypertension, permanent atrial fibrillation chronically, stage IV CKD.  Pt had MBS in 1/23 "[name] oropharyngeal swallowing is functional with adequate efficiency and no aspiration and occasional throat clearing/cough with clear airway.  BSE 04/27/22 + coughing (dry) with possibility of esophageal dysphagia recommended MBS however not charted after 04/27/22( possibly discharged prior to scheduling). Type of Study: Bedside Swallow Evaluation Previous Swallow Assessment:  (see HPI) Diet Prior to this Study: Dysphagia 3 (soft);Thin liquids Temperature Spikes Noted: No Respiratory Status: Nasal cannula History of Recent Intubation: No Behavior/Cognition: Alert;Cooperative;Pleasant mood;Requires cueing Oral Cavity Assessment: Within Functional Limits Oral Care Completed by SLP: No Oral Cavity - Dentition: Adequate natural dentition Vision: Functional for self-feeding Self-Feeding Abilities: Needs assist Patient Positioning: Upright in bed Baseline Vocal Quality: Normal Volitional Cough: Weak Volitional Swallow: Able to elicit    Oral/Motor/Sensory Function Overall Oral Motor/Sensory Function: Within functional limits   Ice Chips Ice chips: Not tested   Thin Liquid Thin Liquid: Impaired Presentation: Cup;Straw Pharyngeal  Phase Impairments: Throat Clearing - Delayed;Cough -  Immediate    Nectar Thick Nectar Thick Liquid: Impaired Pharyngeal Phase Impairments: Cough - Immediate;Throat Clearing - Delayed   Honey Thick Honey Thick Liquid: Not tested   Puree Puree: Impaired Pharyngeal Phase Impairments: Throat Clearing - Delayed   Solid     Solid: Within functional limits      Houston Siren 05/30/2022,4:26 PM

## 2022-05-31 DIAGNOSIS — N3001 Acute cystitis with hematuria: Secondary | ICD-10-CM | POA: Diagnosis not present

## 2022-05-31 DIAGNOSIS — G9341 Metabolic encephalopathy: Secondary | ICD-10-CM | POA: Diagnosis not present

## 2022-05-31 LAB — BASIC METABOLIC PANEL
Anion gap: 9 (ref 5–15)
BUN: 51 mg/dL — ABNORMAL HIGH (ref 8–23)
CO2: 29 mmol/L (ref 22–32)
Calcium: 8.4 mg/dL — ABNORMAL LOW (ref 8.9–10.3)
Chloride: 95 mmol/L — ABNORMAL LOW (ref 98–111)
Creatinine, Ser: 1.63 mg/dL — ABNORMAL HIGH (ref 0.44–1.00)
GFR, Estimated: 30 mL/min — ABNORMAL LOW (ref >60)
Glucose, Bld: 101 mg/dL — ABNORMAL HIGH (ref 70–99)
Potassium: 3.1 mmol/L — ABNORMAL LOW (ref 3.5–5.1)
Sodium: 133 mmol/L — ABNORMAL LOW (ref 135–145)

## 2022-05-31 LAB — CBC
HCT: 32.1 % — ABNORMAL LOW (ref 36.0–46.0)
Hemoglobin: 10.2 g/dL — ABNORMAL LOW (ref 12.0–15.0)
MCH: 28.7 pg (ref 26.0–34.0)
MCHC: 31.8 g/dL (ref 30.0–36.0)
MCV: 90.2 fL (ref 80.0–100.0)
Platelets: UNDETERMINED 10*3/uL (ref 150–400)
RBC: 3.56 MIL/uL — ABNORMAL LOW (ref 3.87–5.11)
RDW: 19.2 % — ABNORMAL HIGH (ref 11.5–15.5)
WBC: 6.5 10*3/uL (ref 4.0–10.5)
nRBC: 0 % (ref 0.0–0.2)

## 2022-05-31 LAB — URINE CULTURE: Culture: >=100000 — AB

## 2022-05-31 MED ORDER — SODIUM CHLORIDE 0.9 % IV SOLN
1.0000 g | Freq: Two times a day (BID) | INTRAVENOUS | Status: AC
  Administered 2022-05-31 – 2022-06-06 (×13): 1 g via INTRAVENOUS
  Filled 2022-05-31 (×14): qty 20

## 2022-05-31 NOTE — TOC Initial Note (Signed)
Transition of Care [facility] - Initial/Assessment Note    Patient Details  Name: [name] Dyer MRN: [id number] Date of Birth: Feb 19, 1932  Transition of Care [facility]) CM/SW Contact:    Ross Ludwig, LCSW Phone Number: 05/31/2022, 5:19 PM  Clinical Narrative:                  Patient is an 88 year old female who is alert and oriented x2 and lives with her son.  Patient has some dementia, CSW completed assessment by speaking to patient's son [name] Dyer.  Per patient's son, she is currently receiving HH PT, OT, RN, and ST, through [facility].  Patient was recently in the hospital, and discharged back home with home health.  [name] son stated that she has recently become weak and was confused, however normally she is able to ambulate with a walker, and has good days and bad days from dementia.  CSW discussed PT recommendation for SNF placement, patient, son stated that he would prefer her to return home if she gets stronger again like the last hospitalization.  Per patient's son during the last hospitalization, after a couple of days being treated with antibiotics patient was able to walk well enough to return back home.  [name] son expressed concern about if she goes to a SNF, she has a higher chance of getting sick from Covid or other things.  [name] son is open to SNF and gave CSW permission to begin bed search in [facility].  CSW explained that if they decide to go to a SNF, CSW will have to get insurance authorization which may take a day or two.  CSW explained the process and what to expect at SNF.  CSW also informed him how insurance approval is completed.  CSW to begin bed search for SNF placement.  Expected Discharge Plan: Skilled Nursing Facility Barriers to Discharge: Continued Medical Work up   Patient Goals and CMS Choice Patient states their goals for this hospitalization and ongoing recovery are:: To go to SNF for short term rehab, then return back home. CMS Medicare.gov  Compare Post Acute Care list provided to:: Patient Represenative (must comment) Choice offered to / list presented to : Adult Children  Expected Discharge Plan and Services Expected Discharge Plan: [place] In-house Referral: Clinical Social Work   Post Acute Care Choice: [place] arrangements for the past 2 months: Single Family Home                           HH Arranged: RN, PT, OT, Speech Therapy HH Agency: [facility]        Prior Living Arrangements/Services Living arrangements for the past 2 months: Single Family Home Lives with:: Adult Children Patient language and need for interpreter reviewed:: Yes Do you feel safe going back to the place where you live?: No   [name] family feels that she would do better at home, but are open to possibly SNF if needed.  Need for Family Participation in Patient Care: Yes (Comment) Care giver support system in place?: Yes (comment) Current home services: Home PT, Home OT, Home RN, Other (comment) (Speech Therapy) Criminal Activity/Legal Involvement Pertinent to Current Situation/Hospitalization: No - Comment as needed  Activities of Daily Living Home Assistive Devices/Equipment: Walker (specify type), Wheelchair, Grab bars around toilet, Grab bars in shower, Bedside commode/3-in-1, Shower chair with back, Hand-held shower hose, Eyeglasses ADL Screening (condition at time of admission) [name] cognitive ability  adequate to safely complete daily activities?: No Is the patient deaf or have difficulty hearing?: No Does the patient have difficulty seeing, even when wearing glasses/contacts?: No Does the patient have difficulty concentrating, remembering, or making decisions?: Yes Patient able to express need for assistance with ADLs?: No Does the patient have difficulty dressing or bathing?: Yes Independently performs ADLs?: No Communication: Independent Dressing (OT): Needs assistance Is this a change  from baseline?: Pre-admission baseline Grooming: Needs assistance Is this a change from baseline?: Pre-admission baseline Feeding: Needs assistance Is this a change from baseline?: Change from baseline, expected to last >3 days Bathing: Needs assistance Is this a change from baseline?: Change from baseline, expected to last >3 days Toileting: Needs assistance Is this a change from baseline?: Change from baseline, expected to last >3days In/Out Bed: Needs assistance Is this a change from baseline?: Change from baseline, expected to last >3 days Walks in Home: Dependent Is this a change from baseline?: Change from baseline, expected to last >3 days Does the patient have difficulty walking or climbing stairs?: Yes Weakness of Legs: Both Weakness of Arms/Hands: Both  Permission Sought/Granted Permission sought to share information with : Case Manager, Customer service manager, Family Supports Permission granted to share information with : Yes, Verbal Permission Granted, Yes, Release of Information Signed  Share Information with NAME: [name] Dyer, [name] Dyer [phone]  [phone]  [name] Dyer Son [phone]  [phone]  Permission granted to share info w AGENCY: SNF admissions        Emotional Assessment Appearance:: Appears stated age   Affect (typically observed): Calm, Accepting, Stable Orientation: : Oriented to Self, Oriented to Place Alcohol / Substance Use: Not Applicable Psych Involvement: No (comment)  Admission diagnosis:  Rash [R21] Acute cystitis with hematuria [N30.01] Altered mental status, unspecified altered mental status type [H47.65] Acute metabolic encephalopathy [Y65.03] Patient Active Problem List   Diagnosis Date Noted   Acute metabolic encephalopathy 54/65/6812   Severe sepsis ([place]) 75/17/0017   Chronic systolic CHF (congestive heart failure) ([place]) 05/29/2022   Acute CVA (cerebrovascular accident) ([place]) 04/22/2022   CVA (cerebral  vascular accident) ([place]) 04/22/2022   Urinary tract infection due to ESBL Klebsiella 04/22/2022   Acute kidney injury superimposed on chronic kidney disease ([place]) 04/22/2022   Hypertensive heart and kidney disease with chronic diastolic congestive heart failure and stage 4 chronic kidney disease ([place]) 04/20/2022   Dyspnea on minimal exertion 01/08/2022   Atypical angina ([place]) 01/08/2022   Acute on chronic diastolic heart failure ([place]) 01/08/2022   Neuropathy 07/17/2021   Hand joint pain 12/24/2020   Chronic dermatitis 11/24/2020   Acute cystitis 05/22/2020   Chronic back pain 05/22/2020   Cystocele without uterine prolapse 05/22/2020   Pain of toe of left foot 04/26/2020   Acute on chronic diastolic (congestive) heart failure ([place]) 01/31/2020   CHF (congestive heart failure) ([place]) 01/30/2020   Chronic rhinitis 11/09/2019   Facial pain syndrome 11/09/2019   Rash 11/09/2019   Temporal arteritis ([place]) 11/09/2019   Chronic anticoagulation 06/24/2018   Peripheral neuropathy 06/24/2018   Alzheimer's disease ([place]) 04/08/2018   Insomnia 04/08/2018   Osteoporosis 04/08/2018   Memory difficulty 10/01/2016   Colitis presumed infectious 06/06/2016   Chronic diastolic CHF (congestive heart failure) ([place]) 06/06/2016   Permanent atrial fibrillation ([place]) 06/06/2016   Hypertension 06/06/2016   Hyperlipidemia 06/06/2016   Diarrhea 06/04/2016   Generalized abdominal pain 06/04/2016   Anemia 04/27/2012   PCP:  [name], NP Pharmacy:   [facility] -  [address] [place] [address] Phone: [phone] Fax: [phone]  [id number] [address] [place] [id number]: [phone]: [address] [address]Phone: [phone]: [phone] [facility] [id number]: [phone]: [phone]    Social Determinants of Health (SDOH) Interventions    Readmission Risk Interventions    05/31/2022    5:13 PM  Readmission Risk Prevention Plan  Transportation Screening Complete  Medication Review (RN Care Manager) Referral to Pharmacy  PCP or Specialist appointment within 3-5 days of discharge Complete  HRI or Home Care Consult Complete  SW Recovery Care/Counseling Consult Complete  [place]

## 2022-05-31 NOTE — Evaluation (Signed)
Physical Therapy Evaluation Patient Details Name: [name] Dyer MRN: [id number] DOB: 1932-12-19 Today's Date: 05/31/2022  History of Present Illness  [name] Dyer is a 88 y.o. female presents with AMS and admitted with acute metabolic encephalopathy. PMH: advanced dementia, chronic anemia, CHF, hyperlipidemia, HTN, permanent afib, stage IV CKD, osteoporosis  Clinical Impression  Pt admitted with above diagnosis. PLOF and home setup information obtained from chart review; pt's pastor in room states pt using RW to ambulate when he sees her. Pt currently requiring significant assistance to enter/exit the bed and to power to stand. Pt verbalizes fear of falling throughout session, requires reassurance from therapist and cues to improve pt's performance. Pt requires max multimodal cues and increased time to follow commands, and at times requires different cues or not following commands. Pt able to take a few sidesteps up to [facility] with RW and assistance, therapist maneuvering RW and cuing pt throughout steps, pt only moving each foot ~1 inch. Assisted pt back to supine with all needs in reach. Recommending short term SNF for strengthening prior to return home with family to support. Pt currently with functional limitations due to the deficits listed below (see PT Problem List). Pt will benefit from skilled PT to increase their independence and safety with mobility to allow discharge to the venue listed below.          Recommendations for follow up therapy are one component of a multi-disciplinary discharge planning process, led by the attending physician.  Recommendations may be updated based on patient status, additional functional criteria and insurance authorization.  Follow Up Recommendations Skilled nursing-short term rehab (<3 hours/day)    Assistance Recommended at Discharge Frequent or constant Supervision/Assistance  Patient can return home with the following  Two people to help with  walking and/or transfers;A lot of help with bathing/dressing/bathroom;Assistance with feeding;Assistance with cooking/housework;Assist for transportation;Direct supervision/assist for medications management    Equipment Recommendations None recommended by PT  Recommendations for Other Services       Functional Status Assessment Patient has had a recent decline in their functional status and demonstrates the ability to make significant improvements in function in a reasonable and predictable amount of time.     Precautions / Restrictions Precautions Precautions: Fall Restrictions Weight Bearing Restrictions: No      Mobility  Bed Mobility Overal bed mobility: Needs Assistance Bed Mobility: Supine to Sit, Sit to Supine  Supine to sit: Max assist Sit to supine: Mod assist, +2 for physical assistance  General bed mobility comments: pt slowly inches BLE towards EOB with max multmodal cues and significantly increased time, pt grabs therapist's hands and attempts to upright trunk with strong grip and upper body strength but appears to be limited by fear of falling because repeats "don't let me fall" and then returns trunk back down to mattress, ultimately requiring max A to bring BLE off of bed and upright trunk with use of bedpad to scoot pt out to EOB; initial sitting EOB pt with strong lean to R, improving with time and contstant cues    Transfers Overall transfer level: Needs assistance Equipment used: Rolling walker (2 wheels) Transfers: Sit to/from Stand Sit to Stand: Mod assist, +2 physical assistance  General transfer comment: assist to power to stand and brace RW due to pt wanting to pull on RW, strong posterior lean and BLE braced against bed with slight R trunk lean, heavy multimodal cues, requires increased time and prefers flexed trunk posture unless max cues  Ambulation/Gait Ambulation/Gait assistance: Mod assist, +2 safety/equipment  Assistive device: Rolling walker (2  wheels) Gait Pattern/deviations: Shuffle Gait velocity: decreased  General Gait Details: pt takes slow, short, shuffling steps moving each for ~1 inch at a time to sidestep up to [facility], therapist managing RW and steadying pt with constant cueing for sequencing, upright posture and continuation of task; pt vocal regarding fear of falling  Stairs            Wheelchair Mobility    Modified Rankin (Stroke Patients Only)       Balance Overall balance assessment: Needs assistance, History of Falls Sitting-balance support: Feet supported, Bilateral upper extremity supported Sitting balance-Leahy Scale: Poor Sitting balance - Comments: BUE supporting mod-min A for static sitting   Standing balance support: During functional activity, Bilateral upper extremity supported, Reliant on assistive device for balance Standing balance-Leahy Scale: Zero Standing balance comment: +2 for safety, strong posterior lean       Pertinent Vitals/Pain Pain Assessment Pain Assessment: No/denies pain    Home Living Family/patient expects to be discharged to:: Private residence Living Arrangements: Children (2 sons alternate living with pt) Available Help at Discharge: Family;Available 24 hours/day (family alternating to provide 24/7 care per son) Type of Home: House Home Access: Ramped entrance  Home Layout: One level Home Equipment: Cane - single point;Grab bars - tub/shower;Hand held shower head;Rolling Walker (2 wheels);Rollator (4 wheels);BSC/3in1;Hospital bed Additional Comments: All information above obtained from chart review, no family present during eval; pt's pastor present during eval states pt uses RW when he sees her walking    Prior Function Prior Level of Function : Patient poor historian/Family not available        Hand Dominance        Extremity/Trunk Assessment   Upper Extremity Assessment Upper Extremity Assessment: Overall WFL for tasks assessed    Lower Extremity  Assessment Lower Extremity Assessment: Generalized weakness    Cervical / Trunk Assessment Cervical / Trunk Assessment: Kyphotic  Communication   Communication:  (minimally conversational, difficult to determine)  Cognition Arousal/Alertness: Awake/alert Behavior During Therapy: Flat affect Overall Cognitive Status: History of cognitive impairments - at baseline  General Comments: pt responds to name appropriately, follows 1 step commands inconsistently, when does requires increasead time, max verbal cues        General Comments      Exercises     Assessment/Plan    PT Assessment Patient needs continued PT services  PT Problem List Decreased strength;Decreased activity tolerance;Decreased balance;Decreased mobility;Decreased cognition;Decreased knowledge of use of DME;Decreased safety awareness       PT Treatment Interventions DME instruction;Gait training;Functional mobility training;Therapeutic activities;Therapeutic exercise;Balance training;Patient/family education;Cognitive remediation    PT Goals (Current goals can be found in the Care Plan section)  Acute Rehab PT Goals Patient Stated Goal: none stated PT Goal Formulation: Patient unable to participate in goal setting Time For Goal Achievement: 06/14/22 Potential to Achieve Goals: Fair    Frequency Min 2X/week     Co-evaluation               AM-PAC PT "6 Clicks" Mobility  Outcome Measure Help needed turning from your back to your side while in a flat bed without using bedrails?: A Lot Help needed moving from lying on your back to sitting on the side of a flat bed without using bedrails?: A Lot Help needed moving to and from a bed to a chair (including a wheelchair)?: A Lot Help needed standing up from a chair using  your arms (e.g., wheelchair or bedside chair)?: A Lot Help needed to walk in hospital room?: Total Help needed climbing 3-5 steps with a railing? : Total 6 Click Score: 10    End of  Session Equipment Utilized During Treatment: Gait belt Activity Tolerance: Patient tolerated treatment well;Other (comment) (limited by fear of falling) Patient left: in bed;with call bell/phone within reach;with bed alarm set;with nursing/sitter in room;with family/visitor present Nurse Communication: Mobility status;Other (comment) (strong fear of falling) PT Visit Diagnosis: Unsteadiness on feet (R26.81);Other abnormalities of gait and mobility (R26.89);Muscle weakness (generalized) (M62.81);History of falling (Z91.81)    Time: 4327-6147 PT Time Calculation (min) (ACUTE ONLY): 30 min   Charges:   PT Evaluation $PT Eval Moderate Complexity: 1 Mod PT Treatments $Therapeutic Activity: 8-22 mins         Talbot Grumbling PT, DPT 05/31/22, 12:37 PM

## 2022-05-31 NOTE — Progress Notes (Signed)
PROGRESS NOTE   [name] Dyer  CBJ:[id number]    DOB: [birth year]    DOA: 05/29/2022  PCP: [name], NP   I have briefly reviewed patients previous medical records in [facility].  Chief Complaint  Patient presents with   Medication Reaction    Brief Narrative:  88 year old female, lives with her sons who between them are able to provide 24/7 supervision and assistance, ambulates with a walker but lately has been using a wheelchair, medical history significant for moderate to severe dementia, chronic anemia, chronic right heart failure, hyperlipidemia, hypertension, permanent atrial fibrillation chronically on Xarelto, stage IV CKD with baseline creatinine 1.5-2, at baseline does okay in the daytime, more confused in the nights, unable to consistently recognize family, presented with complaints of 4 to 5 days history of worsening confusion and somnolence.  Seen by PCP and started on Macrobid for Enterococcus faecalis UTI, after 2 doses, the next day noted significant bilateral lower extremity rash and no significant improvement in mental status, presented to ED.  Per family, AMS similar to when she had prior episodes of UTI.  Admitted for acute metabolic encephalopathy and suspected acute cystitis.   Assessment & Plan:  Principal Problem:   Acute metabolic encephalopathy Active Problems:   Permanent atrial fibrillation (HCC)   Hypertension   Hyperlipidemia   Acute cystitis   Rash   Severe sepsis (HCC)   Chronic systolic CHF (congestive heart failure) (HCC)   Acute metabolic encephalopathy: Due to acute cystitis, dehydration, AKI complicating underlying advanced dementia.  Prior history of same. Treat underlying cause and monitor. Delirium precautions. CT head without acute findings. UDS negative.  Ammonia unremarkable. Appears more alert than in ED.  Follow-up with family regarding improvement.  Enterococcus faecium> ESBL Klebsiella pneumonia acute cystitis: OP  urine culture 5/26 showed Enterococcus and sensitive to ampicillin, vancomycin and nitrofurantoin. Received 3 doses of nitrofurantoin.  Developed significant lower extremity rash.  Nitrofurantoin discontinued at admission. Treated briefly with IV ampicillin but admission urine culture shows ESBL Klebsiella, only sensitive to gentamicin and imipenem. Hard to differentiate if this is true UTI or colonization.  Family insistent that in the past when she was treated for this, AMS improved.  Therefore we will continue to treat for UTI. As discussed with pharmacy 6/3, ampicillin changed to meropenem, continue while hospitalized and may be a dose of fosfomycin at time of discharge. Blood cultures negative to date. Severe sepsis documented in H&P.  However on review of chart carefully, on admission, she had no fever, tachycardia, tachypnea but had leukocytosis and lactate elevation.  I do not agree with severe sepsis diagnosis.  Sepsis ruled out.  Bilateral legs rash: Reportedly started overnight after initiating Macrobid x2 doses. Possible Macrobid allergy-added to allergy list Stable, no worse and may be slowly improving.  Dehydration: Briefly hydrated with IV fluids.  Per nursing, ate 100% of breakfast IV fluids discontinued.  Acute kidney injury complicating stage IIIb CKD Baseline creatinine probably in the 1.5-1.7 range. Presented with creatinine of 1.9. Briefly hydrated with IV fluids, creatinine down to baseline 1.59-1.63.  Follow BMP in a.m.  Elevated lactate: Secondary to dehydration and AKI.  Resolved.  Hypokalemia: Continue to replace aggressively and follow.  Magnesium 2.6.  Chronic anemia: Stable.  Chronic right heart failure:  Clinically dehydrated on arrival.  Holding Lasix and metolazone. Continue to hold diuretics for now.  Hyperlipidemia: Continue statins  Permanent A-fib Continue home dose of atenolol and Xarelto.  Discussed extensively with son that if  she has  frequent falls then her outpatient providers will need to carefully weigh the benefits of anticoagulation versus risks of catastrophic bleeding.  He verbalized understanding.  Essential hypertension: Continue atenolol.  Controlled.  Advanced dementia: Reportedly used to follow-up with [facility] and has been released. Continue Aricept and Namenda.  Falls at home: PT evaluation pending.  Dysphagia: Per SLP, continue dysphagia 3 diet and thin liquids.  Patient tolerating well per RN.  Body mass index is 22.06 kg/m.   DVT prophylaxis: SCDs Start: 05/29/22 2042     Code Status: Full Code:  Family Communication: None at bedside Disposition:  Status is: Inpatient Possible DC in the next 1 to 2 days pending PT evaluation, may need SNF.     Consultants:   None  Procedures:   None  Antimicrobials:   IV ampicillin-DC'd 6/3 IV meropenem 6/3  Subjective:  Seen this AM along with RN at bedside.  Patient awake and alert, oriented to self and place.  Poor historian.  Unable to get much out of her.  As per RN, she ate 100% of her breakfast and fed herself with some cues from nursing.  No urinary retention or bladder scan.  Objective:   Vitals:   05/31/22 0002 05/31/22 0423 05/31/22 0939 05/31/22 1231  BP: 121/62 124/66 119/69 133/90  Pulse: 88 82 76 94  Resp: _0 Temp: 98.2 F (36.8 C) 98.7 F (37.1 C)  98 F (36.7 C)  TempSrc: Oral Oral  Oral  SpO2: 94% 93%  92%  Weight:      Height:        General exam: Very elderly female, moderately built and frail, sitting up comfortably in bed without distress.  Looks much improved compared to ED.  Oral mucosa moist. Respiratory system: Clear to auscultation. Respiratory effort normal. Cardiovascular system: S1 & S2 heard, RRR. No JVD, murmurs, rubs, gallops or clicks. No pedal edema.  Telemetry personally reviewed: A-fib with controlled ventricular rate, BBB morphology. Gastrointestinal system: Abdomen is  nondistended, soft and nontender. No organomegaly or masses felt. Normal bowel sounds heard. Central nervous system: Mental status as noted above. No focal neurological deficits. Extremities: Symmetric 5 x 5 power. Skin: Bilateral leg with significant erythematous?  Petechial rash up to the knees as noted in picture below from 6/2.  Rash looks about the same or may be slightly better compared to yesterday. Psychiatry: Judgement and insight impaired. Mood & affect appropriate.      Data Reviewed:   I have personally reviewed following labs and imaging studies   CBC: Recent Labs  Lab 05/29/22 1630 05/29/22 2031 05/30/22 0422 05/31/22 0524  WBC 13.8*  --  9.1 6.5  NEUTROABS 13.0*  --  7.9*  --   HGB 11.3*  --  10.0* 10.2*  HCT 35.7*  --  31.1* 32.1*  MCV 91.3  --  89.9 90.2  PLT PLATELET CLUMPS NOTED ON SMEAR, UNABLE TO ESTIMATE PLATELET CLUMPS NOTED ON SMEAR, UNABLE TO ESTIMATE 158 PLATELET CLUMPS NOTED ON SMEAR, UNABLE TO ESTIMATE    Basic Metabolic Panel: Recent Labs  Lab 05/29/22 1536 05/29/22 1727 05/30/22 0422 05/31/22 0524  NA 133*  --  135 133*  K 3.5  --  3.3* 3.1*  CL 88*  --  94* 95*  CO2 30  --  30 29  GLUCOSE 123*  --  102* 101*  BUN 59*  --  53* 51*  CREATININE 1.94*  --  1.59* 1.63*  CALCIUM 9.1  --  8.6* 8.4*  MG  --  2.4 2.6*  --     Liver Function Tests: Recent Labs  Lab 05/29/22 1536 05/30/22 0422  AST 31 27  ALT 18 15  ALKPHOS 106 76  BILITOT 1.9* 1.5*  PROT 8.2* 6.5  ALBUMIN 3.9 3.0*    CBG: No results for input(s): GLUCAP in the last 168 hours.  Microbiology Studies:   Recent Results (from the past 240 hour(s))  Urine Culture     Status: Abnormal   Collection Time: 05/23/22  3:48 PM   Specimen: Urine  Result Value Ref Range Status   MICRO NUMBER: [id number]  Final   SPECIMEN QUALITY: Adequate  Final   Sample Source URINE, CLEAN CATCH  Final   STATUS: FINAL  Final   ISOLATE 1: Enterococcus faecium (A)  Final    Comment: Greater  than 100,000 CFU/mL of Enterococcus faecium   COMMENT:   Final    Additional non-predominating organism(s) isolated. These organisms, commonly found on external and internal genitalia, are considered colonizers. No further testing performed.      Susceptibility   Enterococcus faecium - URINE CULTURE POSITIVE 1    AMPICILLIN <=2 Sensitive     VANCOMYCIN <=0.5 Sensitive     NITROFURANTOIN* 32 Sensitive      * Legend: S = Susceptible  I = Intermediate R = Resistant  NS = Not susceptible * = Not tested  NR = Not reported **NN = See antimicrobic comments   Blood Culture (routine x 2)     Status: None (Preliminary result)   Collection Time: 05/29/22  3:36 PM   Specimen: BLOOD  Result Value Ref Range Status   Specimen Description   Final    BLOOD BLOOD LEFT ARM Performed at [facility] [address], [place], [place] [address]    Special Requests   Final    BOTTLES DRAWN AEROBIC AND ANAEROBIC Blood Culture results may not be optimal due to an inadequate volume of blood received in culture bottles Performed at [facility] [address], [place], [place] [address]    Culture   Final    NO GROWTH 2 DAYS Performed at [facility] [address], [place], [place] [address]    Report Status PENDING  Incomplete  Blood Culture (routine x 2)     Status: None (Preliminary result)   Collection Time: 05/29/22  5:03 PM   Specimen: BLOOD  Result Value Ref Range Status   Specimen Description   Final    BLOOD BLOOD RIGHT FOREARM Performed at [facility] [address] [place], [place] [address]    Special Requests   Final    BOTTLES DRAWN AEROBIC AND ANAEROBIC Blood Culture results may not be optimal due to an inadequate volume of blood received in culture bottles Performed at [facility] [address], [place], [place] [address]    Culture   Final    NO GROWTH 2 DAYS Performed at [facility] [address], [place], [place] [address]    Report Status PENDING  Incomplete  Urine Culture     Status: Abnormal   Collection Time: 05/29/22  6:29 PM   Specimen: In/Out Cath Urine  Result Value Ref Range Status   Specimen Description   Final    IN/OUT CATH URINE Performed at [facility] [address], [place], [place] [address]  Special Requests   Final    NONE Performed at [facility], [place] [address], [place], [place] [address]    Culture (A)  Final    >=100,000 COLONIES/mL KLEBSIELLA PNEUMONIAE Confirmed Extended Spectrum Beta-Lactamase Producer (ESBL).  In bloodstream infections from ESBL organisms, carbapenems are preferred over piperacillin/tazobactam. They are shown to have a lower risk of mortality.    Report Status 05/31/2022 FINAL  Final   Organism ID, Bacteria KLEBSIELLA PNEUMONIAE (A)  Final      Susceptibility   Klebsiella pneumoniae - MIC*    AMPICILLIN >=32 RESISTANT Resistant     CEFAZOLIN >=64 RESISTANT Resistant     CEFEPIME >=32 RESISTANT Resistant     CEFTRIAXONE >=64 RESISTANT Resistant     CIPROFLOXACIN >=4 RESISTANT Resistant     GENTAMICIN <=1 SENSITIVE Sensitive     IMIPENEM <=0.25 SENSITIVE Sensitive     NITROFURANTOIN 64 INTERMEDIATE Intermediate     TRIMETH/SULFA >=320 RESISTANT Resistant     AMPICILLIN/SULBACTAM >=32 RESISTANT Resistant     PIP/TAZO 32 INTERMEDIATE Intermediate     * >=100,000 COLONIES/mL KLEBSIELLA PNEUMONIAE    Radiology Studies:  CT HEAD WO CONTRAST (5MM)  Result Date: 05/29/2022 CLINICAL DATA:  Delirium.  Altered level of consciousness. EXAM: CT HEAD WITHOUT CONTRAST TECHNIQUE: Contiguous axial images were obtained from the base of the skull through the vertex without intravenous contrast. RADIATION DOSE REDUCTION: This exam was performed according to the departmental dose-optimization program which includes automated exposure control, adjustment of the mA and/or kV according to  patient size and/or use of iterative reconstruction technique. COMPARISON:  Head CT and brain MRI 05/18/2022 FINDINGS: Brain: No acute intracranial hemorrhage. Stable atrophy. Advanced periventricular and deep chronic small vessel ischemia. Multiple remote cerebral and cerebellar chronic infarcts are unchanged from prior exam. No evidence of acute ischemia, although degree of background chronic change limits detailed assessment. No subdural or extra-axial collection. Vascular: Atherosclerosis of skullbase vasculature without hyperdense vessel or abnormal calcification. Skull: No fracture or focal lesion. Sinuses/Orbits: No acute findings.  Bilateral cataract resection. Other: None. IMPRESSION: 1. No acute intracranial abnormality. 2. Stable atrophy and advanced chronic small vessel ischemia. Multiple remote cerebral and cerebellar infarcts are unchanged from prior exam. Electronically Signed   By: Keith Rake M.D.   On: 05/29/2022 18:00    Scheduled Meds:    allopurinol  100 mg Oral Daily   atenolol  50 mg Oral Daily   calcium carbonate  1,250 mg Oral Q breakfast   cholecalciferol  2,000 Units Oral Daily   donepezil  10 mg Oral QHS   memantine  5 mg Oral BID   pantoprazole  40 mg Oral Daily   potassium chloride SA  40 mEq Oral Daily   Rivaroxaban  15 mg Oral Q supper   rosuvastatin  5 mg Oral Daily   saccharomyces boulardii  250 mg Oral BID    Continuous Infusions:    ampicillin (OMNIPEN) IV 2 g (05/31/22 0946)     LOS: 2 days     Vernell Leep, MD,  FACP, [facility], [facility], [facility] (Care Management Physician Certified) [place]  To contact the attending provider between 7A-7P or the covering provider during after hours 7P-7A, please log into the web site [email or web address] and access using universal [place] password for that web site. If you do not have the password, please call the hospital operator.  05/31/2022, 3:56 PM

## 2022-05-31 NOTE — Plan of Care (Signed)
  Problem: Health Behavior/Discharge Planning: Goal: Ability to manage health-related needs will improve Outcome: Progressing   Problem: Clinical Measurements: Goal: Ability to maintain clinical measurements within normal limits will improve Outcome: Progressing   Problem: Safety: Goal: Ability to remain free from injury will improve Outcome: Progressing

## 2022-05-31 NOTE — NC FL2 (Signed)
[place] LEVEL OF CARE SCREENING TOOL     IDENTIFICATION  Patient Name: [name] Dyer Birthdate: 1932/03/30 Sex: female Admission Date (Current Location): 05/29/2022  [facility] and [place] Number:  Herbalist and Address:  [facility],  [place], [place]      Provider Number: [id number]  Attending Physician Name and Address:  Modena Jansky, MD  Relative Name and Phone Number:  Brit[place], Defrain [phone]  [phone]  [place] Jr,[name] Son [phone]  [phone]    Current Level of Care: Hospital Recommended Level of Care: [place] Prior Approval Number:    Date Approved/Denied:   PASRR Number: [id number]  Discharge Plan: SNF    Current Diagnoses: Patient Active Problem List   Diagnosis Date Noted   Acute metabolic encephalopathy 56/25/6389   Severe sepsis ([place]) 37/34/2876   Chronic systolic CHF (congestive heart failure) ([place]) 05/29/2022   Acute CVA (cerebrovascular accident) ([place]) 04/22/2022   CVA (cerebral vascular accident) ([place]) 04/22/2022   Urinary tract infection due to ESBL Klebsiella 04/22/2022   Acute kidney injury superimposed on chronic kidney disease ([place]) 04/22/2022   Hypertensive heart and kidney disease with chronic diastolic congestive heart failure and stage 4 chronic kidney disease ([place]) 04/20/2022   Dyspnea on minimal exertion 01/08/2022   Atypical angina (HCC) 01/08/2022   Acute on chronic diastolic heart failure (HCC) 01/08/2022   Neuropathy 07/17/2021   Hand joint pain 12/24/2020   Chronic dermatitis 11/24/2020   Acute cystitis 05/22/2020   Chronic back pain 05/22/2020   Cystocele without uterine prolapse 05/22/2020   Pain of toe of left foot 04/26/2020   Acute on chronic diastolic (congestive) heart failure ([place]) 01/31/2020   CHF (congestive heart failure) (HCC) 01/30/2020   Chronic rhinitis 11/09/2019   Facial pain syndrome 11/09/2019   Rash  11/09/2019   Temporal arteritis (HCC) 11/09/2019   Chronic anticoagulation 06/24/2018   Peripheral neuropathy 06/24/2018   Alzheimer's disease ([place]) 04/08/2018   Insomnia 04/08/2018   Osteoporosis 04/08/2018   Memory difficulty 10/01/2016   Colitis presumed infectious 06/06/2016   Chronic diastolic CHF (congestive heart failure) ([place]) 06/06/2016   Permanent atrial fibrillation ([place]) 06/06/2016   Hypertension 06/06/2016   Hyperlipidemia 06/06/2016   Diarrhea 06/04/2016   Generalized abdominal pain 06/04/2016   Anemia 04/27/2012    Orientation RESPIRATION BLADDER Height & Weight     Self, Place  Normal Incontinent Weight: 128 lb 8.5 oz (58.3 kg) Height:  _0  (162.6 cm)  BEHAVIORAL SYMPTOMS/MOOD NEUROLOGICAL BOWEL NUTRITION STATUS      Continent Diet (Dys 3 diet)  AMBULATORY STATUS COMMUNICATION OF NEEDS Skin   Limited Assist Verbally Normal                       Personal Care Assistance Level of Assistance  Bathing, Feeding, Dressing Bathing Assistance: Limited assistance Feeding assistance: Independent Dressing Assistance: Limited assistance     Functional Limitations Info  Sight, Hearing, Speech Sight Info: Adequate Hearing Info: Adequate Speech Info: Adequate    SPECIAL CARE FACTORS FREQUENCY  PT (By licensed PT), OT (By licensed OT)     PT Frequency: Minimum 5x a week OT Frequency: Minimum 5x a week            Contractures Contractures Info: Not present    Additional Factors Info  Code Status, Allergies, Isolation Precautions Code Status Info: Full Code Allergies Info: Ciprofloxacin   Sulfa Antibiotics   Nitrofurantoin   Memantine  Succinylsulphathiazole   Sulfamethoxazole     Isolation Precautions Info: ESBL     Current Medications (05/31/2022):  This is the current hospital active medication list Current Facility-Administered Medications  Medication Dose Route Frequency Provider Last Rate Last Admin   acetaminophen (TYLENOL) tablet 650 mg   650 mg Oral Q6H PRN Howerter, Justin B, DO       Or   acetaminophen (TYLENOL) suppository 650 mg  650 mg Rectal Q6H PRN Howerter, Justin B, DO   650 mg at 05/30/22 0616   allopurinol (ZYLOPRIM) tablet 100 mg  100 mg Oral Daily Vernell Leep D, MD   100 mg at 05/31/22 0920   atenolol (TENORMIN) tablet 50 mg  50 mg Oral Daily Vernell Leep D, MD   50 mg at 05/31/22 0944   calcium carbonate (OS-CAL - dosed in mg of elemental calcium) tablet 1,250 mg  1,250 mg Oral Q breakfast Modena Jansky, MD   1,250 mg at 05/31/22 0920   cholecalciferol (VITAMIN D3) tablet 2,000 Units  2,000 Units Oral Daily Modena Jansky, MD   2,000 Units at 05/31/22 0918   donepezil (ARICEPT) tablet 10 mg  10 mg Oral QHS Vernell Leep D, MD   10 mg at 05/30/22 2156   memantine (NAMENDA) tablet 5 mg  5 mg Oral BID Vernell Leep D, MD   5 mg at 05/31/22 0919   meropenem (MERREM) 1 g in sodium chloride 0.9 % 100 mL IVPB  1 g Intravenous Q12H Vernell Leep D, MD 200 mL/hr at 05/31/22 1735 1 g at 05/31/22 1735   Muscle Rub CREA   Topical PRN Rise Patience, MD       pantoprazole (PROTONIX) EC tablet 40 mg  40 mg Oral Daily Vernell Leep D, MD   40 mg at 05/31/22 3702   potassium chloride SA (KLOR-CON M) CR tablet 40 mEq  40 mEq Oral Daily Vernell Leep D, MD   40 mEq at 05/31/22 3017   Rivaroxaban (XARELTO) tablet 15 mg  15 mg Oral Q supper Vernell Leep D, MD   15 mg at 05/31/22 1736   rosuvastatin (CRESTOR) tablet 5 mg  5 mg Oral Daily Vernell Leep D, MD   5 mg at 05/31/22 2091   saccharomyces boulardii (FLORASTOR) capsule 250 mg  250 mg Oral BID Modena Jansky, MD   250 mg at 05/31/22 0681     Discharge Medications: Please see discharge summary for a list of discharge medications.  Relevant Imaging Results:  Relevant Lab Results:   Additional Information SS# [id number]  Ross Ludwig, LCSW

## 2022-06-01 DIAGNOSIS — G9341 Metabolic encephalopathy: Secondary | ICD-10-CM | POA: Diagnosis not present

## 2022-06-01 DIAGNOSIS — N3001 Acute cystitis with hematuria: Secondary | ICD-10-CM | POA: Diagnosis not present

## 2022-06-01 LAB — BASIC METABOLIC PANEL
Anion gap: 9 (ref 5–15)
BUN: 44 mg/dL — ABNORMAL HIGH (ref 8–23)
CO2: 29 mmol/L (ref 22–32)
Calcium: 8.8 mg/dL — ABNORMAL LOW (ref 8.9–10.3)
Chloride: 100 mmol/L (ref 98–111)
Creatinine, Ser: 1.29 mg/dL — ABNORMAL HIGH (ref 0.44–1.00)
GFR, Estimated: 40 mL/min — ABNORMAL LOW (ref >60)
Glucose, Bld: 101 mg/dL — ABNORMAL HIGH (ref 70–99)
Potassium: 3.1 mmol/L — ABNORMAL LOW (ref 3.5–5.1)
Sodium: 138 mmol/L (ref 135–145)

## 2022-06-01 MED ORDER — HYDROCORTISONE ACETATE 25 MG RE SUPP
25.0000 mg | Freq: Two times a day (BID) | RECTAL | Status: DC
  Administered 2022-06-01 – 2022-06-09 (×12): 25 mg via RECTAL
  Filled 2022-06-01 (×17): qty 1

## 2022-06-01 MED ORDER — POTASSIUM CHLORIDE CRYS ER 20 MEQ PO TBCR
40.0000 meq | EXTENDED_RELEASE_TABLET | Freq: Once | ORAL | Status: AC
  Administered 2022-06-01: 40 meq via ORAL
  Filled 2022-06-01: qty 2

## 2022-06-01 MED ORDER — DOCUSATE SODIUM 100 MG PO CAPS
100.0000 mg | ORAL_CAPSULE | Freq: Two times a day (BID) | ORAL | Status: DC
  Administered 2022-06-02 – 2022-06-09 (×8): 100 mg via ORAL
  Filled 2022-06-01 (×13): qty 1

## 2022-06-01 NOTE — Progress Notes (Signed)
Physical Therapy Treatment Patient Details Name: [name] Dyer MRN: [id number] DOB: 10-Dec-1932 Today's Date: 06/01/2022   History of Present Illness [name] Dyer is a 88 y.o. female presents with AMS and admitted with acute metabolic encephalopathy. PMH: advanced dementia, chronic anemia, CHF, hyperlipidemia, HTN, permanent afib, stage IV CKD, osteoporosis    PT Comments    Request from pt's family/MD for pt to be seen again on today. Pt continues to require +2 assistance for mobility. Poor sitting balance so worked on static sitting balance EOB. Utilized STEDY to safely work on anterior weightshifting and static standing. Pt with bowel incontinence so assisted her onto bsc using STEDY. Significant time spent waiting for pt to complete BM before notifying nursing that pt would require nursing assistance for toileting hygiene and assist back to bed once she was ready. Son was present in room to observe level of assistance pt is currently requiring. Encouraged him to discuss d/c plan with his other brother. PT continues to recommend ST SNF rehab. Pt would benefit from increased mobility with nursing assistance, in addition to PT sessions so she doesn't spend all day in bed.    Recommendations for follow up therapy are one component of a multi-disciplinary discharge planning process, led by the attending physician.  Recommendations may be updated based on patient status, additional functional criteria and insurance authorization.  Follow Up Recommendations  Skilled nursing-short term rehab (<3 hours/day)     Assistance Recommended at Discharge Frequent or constant Supervision/Assistance  Patient can return home with the following Two people to help with walking and/or transfers;A lot of help with bathing/dressing/bathroom;Assistance with feeding;Assistance with cooking/housework;Assist for transportation;Direct supervision/assist for medications management   Equipment Recommendations  None  recommended by PT    Recommendations for Other Services       Precautions / Restrictions Precautions Precautions: Fall Precaution Comments: incontinent Restrictions Weight Bearing Restrictions: No     Mobility  Bed Mobility Overal bed mobility: Needs Assistance Bed Mobility: Supine to Sit     Supine to sit: Mod assist, +2 for physical assistance, +2 for safety/equipment, HOB elevated     General bed mobility comments: Max multimodal cueing required. Pt did not initiate bed mobility. Assist for trunk and bil LEs. Utilized bedpad to assist with scooting, positioning at EOB. Worked on static sitting balance for quite sometime before able to safely attempt standing with STEDY. Mod leaning to R and posteriorly requiring external assistance to correct/maintain balance.    Transfers Overall transfer level: Needs assistance   Transfers: Bed to chair/wheelchair/BSC Sit to Stand: Mod assist, +2 safety/equipment, +2 physical assistance           General transfer comment: Max multimodal cueing required. Assist to power up, stabilize, control descent. Utilized STEDY for safety, to encourage anterior weight shifting, to work on static standing tolerance. Pt with trunk and bil knees flexed.  Transfer via Lift Equipment: Stedy  Ambulation/Gait               General Gait Details: NT on today   Stairs             Wheelchair Mobility    Modified Rankin (Stroke Patients Only)       Balance Overall balance assessment: Needs assistance, History of Falls Sitting-balance support: Feet supported, Bilateral upper extremity supported Sitting balance-Leahy Scale: Poor Sitting balance - Comments: mod A for static sitting Postural control: Posterior lean, Right lateral lean Standing balance support: Bilateral upper extremity supported, Reliant on assistive device for  balance Standing balance-Leahy Scale: Poor                              Cognition  Arousal/Alertness: Awake/alert Behavior During Therapy: Flat affect Overall Cognitive Status: History of cognitive impairments - at baseline                                 General Comments: pt responds to name appropriately, follows 1 step commands inconsistently, when does requires increasead time, max multimodal cues        Exercises      General Comments        Pertinent Vitals/Pain Pain Assessment Pain Assessment: Faces Faces Pain Scale: Hurts little more Pain Location: generalized Pain Descriptors / Indicators: Discomfort, Sore Pain Intervention(s): Limited activity within patient's tolerance, Monitored during session, Repositioned    Home Living                          Prior Function            PT Goals (current goals can now be found in the care plan section) Progress towards PT goals: Progressing toward goals    Frequency    Min 2X/week      PT Plan Current plan remains appropriate    Co-evaluation              AM-PAC PT "6 Clicks" Mobility   Outcome Measure  Help needed turning from your back to your side while in a flat bed without using bedrails?: A Lot Help needed moving from lying on your back to sitting on the side of a flat bed without using bedrails?: A Lot Help needed moving to and from a bed to a chair (including a wheelchair)?: A Lot Help needed standing up from a chair using your arms (e.g., wheelchair or bedside chair)?: A Lot Help needed to walk in hospital room?: Total Help needed climbing 3-5 steps with a railing? : Total 6 Click Score: 10    End of Session   Activity Tolerance: Patient tolerated treatment well Patient left: with family/visitor present (having BM on [name]) Nurse Communication: Need for lift equipment;Mobility status (pt on bsc having BM (will need assistance once finished); also that bsc adjustments are uneven-unable to adjust one leg-requested RN place tag on it) PT Visit Diagnosis:  Unsteadiness on feet (R26.81);Other abnormalities of gait and mobility (R26.89);Muscle weakness (generalized) (M62.81);History of falling (Z91.81)     Time: 9323-5573 PT Time Calculation (min) (ACUTE ONLY): 44 min  Charges:  $Therapeutic Activity: 38-52 mins                         Doreatha Massed, PT Acute Rehabilitation  Office: [phone] Pager: [phone]

## 2022-06-01 NOTE — Progress Notes (Signed)
PROGRESS NOTE   [name] Dyer  YHC:[id number]    DOB: Nov 29, 1932    DOA: 05/29/2022  PCP: [name], NP   I have briefly reviewed patients previous medical records in [facility].  Chief Complaint  Patient presents with   Medication Reaction    Brief Narrative:  88 year old female, lives with her sons who between them are able to provide 24/7 supervision and assistance, ambulates with a walker but lately has been using a wheelchair, medical history significant for moderate to severe dementia, chronic anemia, chronic right heart failure, hyperlipidemia, hypertension, permanent atrial fibrillation chronically on Xarelto, stage IV CKD with baseline creatinine 1.5-2, at baseline does okay in the daytime, more confused in the nights, unable to consistently recognize family, presented with complaints of 4 to 5 days history of worsening confusion and somnolence.  Seen by PCP and started on Macrobid for Enterococcus faecalis UTI, after 2 doses, the next day noted significant bilateral lower extremity rash and no significant improvement in mental status, presented to ED.  Per family, AMS similar to when she had prior episodes of UTI.  Admitted for acute metabolic encephalopathy and suspected acute cystitis.   Assessment & Plan:  Principal Problem:   Acute metabolic encephalopathy Active Problems:   Permanent atrial fibrillation (HCC)   Hypertension   Hyperlipidemia   Acute cystitis   Rash   Severe sepsis (HCC)   Chronic systolic CHF (congestive heart failure) (HCC)   Acute metabolic encephalopathy: Due to acute cystitis, dehydration, AKI complicating underlying advanced dementia.  Prior history of same. Treat underlying cause and monitor. Delirium precautions. CT head without acute findings. UDS negative.  Ammonia unremarkable. As discussed with son, mental status improving.  Enterococcus faecium> ESBL Klebsiella pneumonia acute cystitis: OP urine culture 5/26 showed  Enterococcus and sensitive to ampicillin, vancomycin and nitrofurantoin. Received 3 doses of nitrofurantoin.  Developed significant lower extremity rash.  Nitrofurantoin discontinued at admission. Treated briefly with IV ampicillin but admission urine culture shows ESBL Klebsiella, only sensitive to gentamicin and imipenem. Hard to differentiate if this is true UTI or colonization.  Family insistent that in the past when she was treated for this, AMS improved.  Therefore we will continue to treat for UTI. As discussed with pharmacy 6/3, ampicillin changed to meropenem, continue while hospitalized and may be a dose of fosfomycin at time of discharge. Blood cultures negative to date. Sepsis ruled out. Continue IV meropenem.  Bilateral legs rash: Reportedly started overnight after initiating Macrobid x2 doses. Possible Macrobid allergy-added to allergy list Slowly improving.  Dehydration: Briefly hydrated with IV fluids.  Per nursing, ate 100% of breakfast IV fluids discontinued.  Remains at high risk for recurrent dehydration if inconsistent oral intake.  Acute kidney injury complicating stage IIIb CKD Baseline creatinine probably in the 1.5-1.7 range. Presented with creatinine of 1.9. Hydrated with IV fluids and creatinine gradually improving, down to 1.29.  Elevated lactate: Secondary to dehydration and AKI.  Resolved.  Hypokalemia: Continue to replace aggressively and follow.  Magnesium 2.6.  Chronic anemia: Stable.  Chronic right heart failure:  Clinically dehydrated on arrival.  Holding Lasix and metolazone. Continue to hold diuretics for now.  Hyperlipidemia: Continue statins  Permanent A-fib Continue home dose of atenolol and Xarelto.  Discussed extensively with son that if she has frequent falls then her outpatient providers will need to carefully weigh the benefits of anticoagulation versus risks of catastrophic bleeding.  He verbalized understanding.  Essential  hypertension: Continue atenolol.  Controlled.  Advanced  dementia: Reportedly used to follow-up with [facility] and has been released. Continue Aricept and Namenda.  Falls at home: PT evaluated.  Family prefers to take home if possible but are open to SNF for STR.  Dysphagia: Per SLP, continue dysphagia 3 diet and thin liquids.  Patient tolerating well per RN.  Body mass index is 22.06 kg/m.   DVT prophylaxis: SCDs Start: 05/29/22 2042     Code Status: Full Code:  Family Communication: Son via phone, he and his brother have been by to visit the patient and he indicates that her mental status is improving.  Advised him that patient will potentially be ready for discharge tomorrow.  He verbalized understanding. Disposition:  Status is: Inpatient Possible DC to SNF 6/5 pending bed and insurance approval.     Consultants:   None  Procedures:   None  Antimicrobials:   IV ampicillin-DC'd 6/3 IV meropenem 6/3  Subjective:  Patient sitting up in bed eating breakfast, scrambled egg, potatoes and drinking water.  Alert and oriented to self only.  Poor historian.  Objective:   Vitals:   05/31/22 0939 05/31/22 1231 06/01/22 0459 06/01/22 0942  BP: 119/69 133/90 137/67 133/70  Pulse: 76 94 73 99  Resp:  16 18   Temp:  98 F (36.7 C) 99.3 F (37.4 C)   TempSrc:  Oral Oral   SpO2:  92% 95%   Weight:      Height:        General exam: Very elderly female, moderately built and frail, sitting up comfortably in bed without distress.  Oral mucosa moist Respiratory system: Clear to auscultation. Respiratory effort normal. Cardiovascular system: S1 & S2 heard, RRR. No JVD, murmurs, rubs, gallops or clicks. No pedal edema.  Telemetry personally reviewed: Atrial flutter/fib with controlled ventricular rate, BBB morphology, discontinue telemetry 6/4. Gastrointestinal system: Abdomen is nondistended, soft and nontender. No organomegaly or masses felt. Normal bowel  sounds heard. Central nervous system: Mental status as noted above. No focal neurological deficits. Extremities: Symmetric 5 x 5 power. Skin: Bilateral leg with significant erythematous?  Petechial rash up to the knees as noted in picture below from 6/2.  Rash continues to gradually improve, receding away from knee. Psychiatry: Judgement and insight impaired. Mood & affect appropriate.      Data Reviewed:   I have personally reviewed following labs and imaging studies   CBC: Recent Labs  Lab 05/29/22 1630 05/29/22 2031 05/30/22 0422 05/31/22 0524  WBC 13.8*  --  9.1 6.5  NEUTROABS 13.0*  --  7.9*  --   HGB 11.3*  --  10.0* 10.2*  HCT 35.7*  --  31.1* 32.1*  MCV 91.3  --  89.9 90.2  PLT PLATELET CLUMPS NOTED ON SMEAR, UNABLE TO ESTIMATE PLATELET CLUMPS NOTED ON SMEAR, UNABLE TO ESTIMATE 158 PLATELET CLUMPS NOTED ON SMEAR, UNABLE TO ESTIMATE    Basic Metabolic Panel: Recent Labs  Lab 05/29/22 1536 05/29/22 1727 05/30/22 0422 05/31/22 0524 06/01/22 0522  NA 133*  --  135 133* 138  K 3.5  --  3.3* 3.1* 3.1*  CL 88*  --  94* 95* 100  CO2 30  --  _0 GLUCOSE 123*  --  102* 101* 101*  BUN 59*  --  53* 51* 44*  CREATININE 1.94*  --  1.59* 1.63* 1.29*  CALCIUM 9.1  --  8.6* 8.4* 8.8*  MG  --  2.4 2.6*  --   --  Liver Function Tests: Recent Labs  Lab 05/29/22 1536 05/30/22 0422  AST 31 27  ALT 18 15  ALKPHOS 106 76  BILITOT 1.9* 1.5*  PROT 8.2* 6.5  ALBUMIN 3.9 3.0*    CBG: No results for input(s): GLUCAP in the last 168 hours.  Microbiology Studies:   Recent Results (from the past 240 hour(s))  Urine Culture     Status: Abnormal   Collection Time: 05/23/22  3:48 PM   Specimen: Urine  Result Value Ref Range Status   MICRO NUMBER: [id number]  Final   SPECIMEN QUALITY: Adequate  Final   Sample Source URINE, CLEAN CATCH  Final   STATUS: FINAL  Final   ISOLATE 1: Enterococcus faecium (A)  Final    Comment: Greater than 100,000 CFU/mL of Enterococcus  faecium   COMMENT:   Final    Additional non-predominating organism(s) isolated. These organisms, commonly found on external and internal genitalia, are considered colonizers. No further testing performed.      Susceptibility   Enterococcus faecium - URINE CULTURE POSITIVE 1    AMPICILLIN <=2 Sensitive     VANCOMYCIN <=0.5 Sensitive     NITROFURANTOIN* 32 Sensitive      * Legend: S = Susceptible  I = Intermediate R = Resistant  NS = Not susceptible * = Not tested  NR = Not reported **NN = See antimicrobic comments   Blood Culture (routine x 2)     Status: None (Preliminary result)   Collection Time: 05/29/22  3:36 PM   Specimen: BLOOD  Result Value Ref Range Status   Specimen Description   Final    BLOOD BLOOD LEFT ARM Performed at [facility] [address], [place], [place] [address]    Special Requests   Final    BOTTLES DRAWN AEROBIC AND ANAEROBIC Blood Culture results may not be optimal due to an inadequate volume of blood received in culture bottles Performed at [facility] [address] [place], [place] [address]    Culture   Final    NO GROWTH 3 DAYS Performed at [facility], [place] [address], [place], [place] [address]    Report Status PENDING  Incomplete  Blood Culture (routine x 2)     Status: None (Preliminary result)   Collection Time: 05/29/22  5:03 PM   Specimen: BLOOD  Result Value Ref Range Status   Specimen Description   Final    BLOOD BLOOD RIGHT FOREARM Performed at [facility] [address] [place], [place] [address]    Special Requests   Final    BOTTLES DRAWN AEROBIC AND ANAEROBIC Blood Culture results may not be optimal due to an inadequate volume of blood received in culture bottles Performed at [facility] [address], [place], [place] [address]    Culture   Final    NO GROWTH 3 DAYS Performed at [facility], [place] [address], [place], [place]  [address]    Report Status PENDING  Incomplete  Urine Culture     Status: Abnormal   Collection Time: 05/29/22  6:29 PM   Specimen: In/Out Cath Urine  Result Value Ref Range Status   Specimen Description   Final    IN/OUT CATH URINE Performed at [facility] [address] [place], [place] [address]    Special Requests   Final    NONE Performed at [facility], [place] [address] [place],  [place] [address]    Culture (A)  Final    >=100,000 COLONIES/mL KLEBSIELLA PNEUMONIAE Confirmed Extended Spectrum Beta-Lactamase Producer (ESBL).  In bloodstream infections from ESBL organisms, carbapenems are preferred over piperacillin/tazobactam. They are shown to have a lower risk of mortality.    Report Status 05/31/2022 FINAL  Final   Organism ID, Bacteria KLEBSIELLA PNEUMONIAE (A)  Final      Susceptibility   Klebsiella pneumoniae - MIC*    AMPICILLIN >=32 RESISTANT Resistant     CEFAZOLIN >=64 RESISTANT Resistant     CEFEPIME >=32 RESISTANT Resistant     CEFTRIAXONE >=64 RESISTANT Resistant     CIPROFLOXACIN >=4 RESISTANT Resistant     GENTAMICIN <=1 SENSITIVE Sensitive     IMIPENEM <=0.25 SENSITIVE Sensitive     NITROFURANTOIN 64 INTERMEDIATE Intermediate     TRIMETH/SULFA >=320 RESISTANT Resistant     AMPICILLIN/SULBACTAM >=32 RESISTANT Resistant     PIP/TAZO 32 INTERMEDIATE Intermediate     * >=100,000 COLONIES/mL KLEBSIELLA PNEUMONIAE    Radiology Studies:  No results found.  Scheduled Meds:    allopurinol  100 mg Oral Daily   atenolol  50 mg Oral Daily   calcium carbonate  1,250 mg Oral Q breakfast   cholecalciferol  2,000 Units Oral Daily   donepezil  10 mg Oral QHS   memantine  5 mg Oral BID   pantoprazole  40 mg Oral Daily   potassium chloride SA  40 mEq Oral Daily   Rivaroxaban  15 mg Oral Q supper   rosuvastatin  5 mg Oral Daily   saccharomyces boulardii  250 mg Oral BID    Continuous Infusions:    meropenem (MERREM) IV 1 g  (06/01/22 0502)     LOS: 3 days     Vernell Leep, MD,  FACP, [facility], [facility], [facility] (Care Management Physician Certified) [place]  To contact the attending provider between 7A-7P or the covering provider during after hours 7P-7A, please log into the web site [email or web address] and access using universal [place] password for that web site. If you do not have the password, please call the hospital operator.  06/01/2022, 11:02 AM

## 2022-06-01 NOTE — Plan of Care (Signed)

## 2022-06-02 ENCOUNTER — Ambulatory Visit: Payer: Medicare Other | Admitting: Family

## 2022-06-02 ENCOUNTER — Inpatient Hospital Stay (HOSPITAL_COMMUNITY): Payer: Medicare Other

## 2022-06-02 DIAGNOSIS — Z7189 Other specified counseling: Secondary | ICD-10-CM | POA: Diagnosis not present

## 2022-06-02 DIAGNOSIS — N3001 Acute cystitis with hematuria: Secondary | ICD-10-CM | POA: Diagnosis not present

## 2022-06-02 DIAGNOSIS — Z515 Encounter for palliative care: Secondary | ICD-10-CM

## 2022-06-02 DIAGNOSIS — G9341 Metabolic encephalopathy: Secondary | ICD-10-CM | POA: Diagnosis not present

## 2022-06-02 DIAGNOSIS — R4182 Altered mental status, unspecified: Secondary | ICD-10-CM

## 2022-06-02 LAB — COMPREHENSIVE METABOLIC PANEL
ALT: 17 U/L (ref 0–44)
AST: 24 U/L (ref 15–41)
Albumin: 2.9 g/dL — ABNORMAL LOW (ref 3.5–5.0)
Alkaline Phosphatase: 93 U/L (ref 38–126)
Anion gap: 8 (ref 5–15)
BUN: 33 mg/dL — ABNORMAL HIGH (ref 8–23)
CO2: 26 mmol/L (ref 22–32)
Calcium: 8.9 mg/dL (ref 8.9–10.3)
Chloride: 106 mmol/L (ref 98–111)
Creatinine, Ser: 1.11 mg/dL — ABNORMAL HIGH (ref 0.44–1.00)
GFR, Estimated: 48 mL/min — ABNORMAL LOW (ref >60)
Glucose, Bld: 104 mg/dL — ABNORMAL HIGH (ref 70–99)
Potassium: 4.3 mmol/L (ref 3.5–5.1)
Sodium: 140 mmol/L (ref 135–145)
Total Bilirubin: 1.5 mg/dL — ABNORMAL HIGH (ref 0.3–1.2)
Total Protein: 6.5 g/dL (ref 6.5–8.1)

## 2022-06-02 LAB — CBC
HCT: 35.5 % — ABNORMAL LOW (ref 36.0–46.0)
Hemoglobin: 11 g/dL — ABNORMAL LOW (ref 12.0–15.0)
MCH: 28.4 pg (ref 26.0–34.0)
MCHC: 31 g/dL (ref 30.0–36.0)
MCV: 91.7 fL (ref 80.0–100.0)
RBC: 3.87 MIL/uL (ref 3.87–5.11)
RDW: 19.3 % — ABNORMAL HIGH (ref 11.5–15.5)
WBC: 6.6 10*3/uL (ref 4.0–10.5)
nRBC: 0 % (ref 0.0–0.2)

## 2022-06-02 LAB — MAGNESIUM: Magnesium: 2.4 mg/dL (ref 1.7–2.4)

## 2022-06-02 NOTE — TOC Progression Note (Signed)
Transition of Care [facility]) - Progression Note    Patient Details  Name: [name] Dyer MRN: [id number] Date of Birth: [birth year]  Transition of Care [facility]) CM/SW Contact  Rishav Rockefeller, Juliann Pulse, RN Phone Number: 06/02/2022, 3:07 PM  Clinical Narrative: Bed offers given-await choice.  1. 1.5 mi [facility] [place], [place] [address] [phone] Overall rating Much below average 2. 1.9 mi [facility] [place] [address] [place], [place] [address] [phone] Overall rating Average 3. 2 mi [facility] [address] [place], [place] [address] [phone] Overall rating Below average 4. 2.4 mi [facility] at the [place], [place] [address] [phone] Overall rating Above average 5. 2.6 mi [facility] [place], [place] [address] [phone] Overall rating Much below average 6. 3.4 mi [place], [place] [address] [phone] Overall rating Much below average 7. 3.7 mi [facility] [place], [place] [address] [phone] Overall rating Much above average 8. 3.9 mi [facility] [place], [place] [address] [phone] Overall rating Much above average 9. [place] [facility] [address] [place], [place] [address] [phone] Overall rating Average 10. 4.2 mi [facility] [address] [place], [place] [address] [phone] Overall rating Much below average 11. 4.6 mi [facility] [address] [place], [place] [address] [phone] Overall rating Much below average 12. 6.3 mi [facility] [address] [place], [place] [address] [phone] Overall rating Above average 13. 9.2 mi [place] [facility] [place], [place] [address] [phone] Overall rating Below average 14. 9.6 [place], [place] [address] [phone] Overall rating Much above average 15. 9.8 mi [facility] [address] [place], [place] [address] [phone] Overall rating Above average 16. 9.9 mi [facility] [address] [place], [place] [address] [phone] Overall rating Much above average 17. 10.7 mi [place] [facility] [address] [place], [place] [address] [phone] Overall rating Much above average 18. 13.3 [facility] [address] [place], [name] [address] [phone] Overall rating Much below average 19. 13.6 mi [facility] [address] [place], [place] [address] [phone] Overall rating Much below average 20. 14.2 mi [place] [facility] [place], [place] [address] [phone] Overall rating Much above average 21. 14.4 [place] [address] [place], [place] [address] [phone] Overall rating Much below average 22. 15 mi [facility] [place], [place] [address] [phone] Overall rating Above average 23. 15.2 mi [facility] [address] [place], [place] [address] [phone] Overall rating Above average 24. 15.3 mi [facility] [address] [place], [place] [address] [phone] Overall rating Below average 25. 16.9 [place], [place] [address] [phone] Overall rating Average 26. 18.1 mi [place], [place] [address] [phone] Overall rating Below average 27. 18.9 mi  [facility] [place], [place] [address] [phone] Overall rating Much below average 28. 19.7 mi [facility] [place], [place] [address] [phone] Overall rating Much below average 29. 20.2 mi [address] at the [facility] at [facility], [place] [address] [phone] Overall rating Much above average 30. 20.4 mi [facility] [address] [place], [place] [address] [phone] Overall rating Much below average 31. 20.4 mi [facility] [address] [place], [place] [address] [phone] Overall rating Below average 32. 20.6 [place], [place] [address] [phone] Overall rating Much above average 33. 21.2 mi [address] [place], [place] [address] [phone] Overall rating Below average 34. 22.7 mi [facility] [address] [place], [place] [address] [phone] Overall rating Below average 35. 22.8 mi [facility] [address] [place], [place] [address] [phone] Overall rating Much above average 36. 23.1 mi [facility] [address] [place], [place] [address] [phone] Overall rating Below average 37. 23.5 mi [facility] [address] [place], [place] [address] [phone] Overall rating Above average 38. 23.7 [place], [place] [address] [phone] Overall rating Not available18 39. [place], [place] [address] [phone] Overall rating Much below average 40. 24.8 mi [place] [address] [place],  [address] [phone] Overall rating Above average To explore and download nursing home d      Expected Discharge Plan: [place] Barriers to Discharge: Continued Medical Work up  Expected Discharge Plan and Services Expected Discharge Plan: [place] In-house Referral: Clinical Social Work   Post Acute Care Choice: [place] arrangements for the past 2 months: Single Family Home                           HH Arranged: RN, PT, OT, Speech Therapy HH Agency: [facility] [place] Determinants of Health (SDOH) Interventions    Readmission Risk Interventions    05/31/2022    5:13 PM  Readmission Risk Prevention Plan  Transportation Screening Complete  Medication Review (RN Care Manager) Referral to Pharmacy  PCP or Specialist appointment within 3-5 days of discharge Complete  HRI or Home Care Consult Complete  SW Recovery Care/Counseling Consult Complete  [place] Complete

## 2022-06-02 NOTE — Consult Note (Signed)
Palliative Care Consult Note                                  Date: 06/02/2022   Patient Name: [name] Dyer  DOB: Nov 01, 1932  MRN: [id number]  Age / Sex: 88 y.o., female  PCP: Ngetich, [name], NP Referring Physician: Modena Jansky, MD  Reason for Consultation: Establishing goals of care  HPI/Patient Profile: 88 y.o. female  with past medical history of moderate to severe dementia, chronic anemia, chronic right heart failure, hyperlipidemia, hypertension, permanent atrial fibrillation chronically on Xarelto, stage 3B CKD with baseline creatinine 1.5-2. She presented with complaints of 4 to 5 days history of worsening confusion and somnolence.  Seen by PCP and started on Macrobid for Enterococcus Faecium UTI, after 2 doses, the next day noted significant bilateral lower extremity rash and no significant improvement in mental status, presented to ED. She was admitted on 05/29/2022 with acute metabolic encephalopathy, Enterococcus faecium> ESBL Klebsiella pneumonia acute cystitis, bilateral leg rash, dehydration, AKI on CKD, chronic right heart failure, permanent A-fib, advanced dementia, recent falls at home.   PMT was consulted for goals of care conversations.  Past Medical History:  Diagnosis Date   Alzheimer disease ([place])    Anemia    Arrhythmia    Cataract    Colon polyps    Community acquired pneumonia 01/2012   LLL   Congestive heart failure ([place])    Hyperlipidemia    Hypertension    Memory difficulty 10/01/2016   Osteoporosis    Permanent atrial fibrillation (HCC)    Pleural effusion, right 01/2012   SVT (supraventricular tachycardia) ([place]) 01/2012   UTI (lower urinary tract infection)     Subjective:   This NP [name] Dyer reviewed medical records, received report from team, assessed the patient and then meet at the patient's bedside to discuss diagnosis, prognosis, GOC, EOL wishes disposition and options.  I met with  the patient at the bedside who is not able to meaningfully participate.  Also present at bedside was her son [name] Dyer and [name] Dyer.  The patient's other son [name] Dyer was working unable to participate in person or telephonically.   Concept of Palliative Care was introduced as specialized medical care for people and their families living with serious illness.  If focuses on providing relief from the symptoms and stress of a serious illness.  The goal is to improve quality of life for both the patient and the family. Values and goals of care important to patient and family were attempted to be elicited.  Created space and opportunity for patient  and family to explore thoughts and feelings regarding current medical situation   Natural trajectory and current clinical status were discussed. Questions and concerns addressed. Patient  encouraged to call with questions or concerns.    Patient/Family Understanding of Illness: The patient's family understands she is not currently mobile.  She is having problems due to her dementia and the hospitalist has explained to them that she is possibly regressing with her dementia.  She is having worsening difficulties communicating, poor appetite.  There is a question of delirium.  They state that is sometimes improves in a couple days, per hospitalist.  At baseline she was mobile, supervision for toileting, generally able to get around on her own.  Since January 2023 when she had a mild CVA she has had 24/7 care by her sons as well as  a caregiver who comes in for 3 hours in the morning and 3 hours in the evening to help with personal care.  We have further discussion on her acute and chronic issues including the typical progression of dementia as an incurable and insidious condition that can worsen substantially in the setting of acute illness.  We discussed that her confusion worse than normal is likely multifactorial including UTI, acute illness, rapid  progression of dementia, malnutrition, falls, and other things at play.  We discussed that while [name] Dyer hopes for her to be able to ambulate again this may not be possible.  However, family is pursuing SNF/rehab to see if they can get some improvement in her weakness.  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  She has 2 sons, [name] Dyer and [name] Dyer.  She does not have any hobbies currently.  Previously she liked working on word puzzles and jigsaw puzzles.  Patient Values: Family  Goals: To see what improvements can be made in her acute condition, consider rehab for strengthening  Today's Discussion: In addition to discussions described above we had extensive discussion on multiple topics.  The patient's son [name] Dyer explains that he provides the majority of care as he is retired.  [name] Dyer is able to provide it several hours during the day on weekdays and sometimes evenings on the weekends.  However, because he works he is unable to provide substantial care.  [name] husband [name] Dyer and does cooking for everybody.  Because [name] Dyer is the primary caregiver and has Parkinson's he states that he cannot take her home like this, not able to ambulate.  He does say that she has long-term care insurance was helps pay for the in-home caregiver.  We discussed CODE STATUS and [name] Dyer and [name] Dyer states that they understand the futility or at least the low likelihood of success for resuscitation given her current situation and the knowledge that resuscitation would not change anything about her current situation and she will continue to decline from a dementia standpoint.  However, they state that [name] Dyer is having a tougher time with this currently.  About that time the case manager called to talk to them about possible SNF placement and available facilities that could offer bed.  At this point they are on board with attending SNF.  She  may end up requiring long-term care given her care needs.  I recommended outpatient palliative care support and have accepted.  I provided cards with our contact information for any questions or concerns while she is admitted inpatient.  I also provided a copy of resources describing palliative care versus hospice and a copy of "hard choices for loving people" to help in further discussions on goals of care.  I provided emotional and general support through therapeutic listening, empathy, sharing stories, and other techniques.  Answered all questions and addressed all concerns to the best my ability.  Review of Systems  Unable to perform ROS: Mental status change   Objective:   Primary Diagnoses: Present on Admission:  Acute metabolic encephalopathy  Severe sepsis (HCC)  Acute cystitis  Permanent atrial fibrillation (HCC)  Rash  Chronic systolic CHF (congestive heart failure) ([place])  Hyperlipidemia  Hypertension   Physical Exam Vitals and nursing note reviewed.  Constitutional:      General: She is sleeping. She is not in acute distress. Cardiovascular:     Rate and Rhythm: Normal rate.  Pulmonary:  Effort: Pulmonary effort is normal. No respiratory distress.  Abdominal:     General: Abdomen is flat.     Palpations: Abdomen is soft.  Skin:    General: Skin is warm and dry.  Neurological:     Mental Status: She is easily aroused. She is disoriented and confused.    Vital Signs:  BP (!) 140/98 (BP Location: Right Arm)   Pulse 69   Temp 98.1 F (36.7 C) (Oral)   Resp 20   Ht _0  (1.626 m)   Wt 58.3 kg   SpO2 94%   BMI 22.06 kg/m   Palliative Assessment/Data: 20-30%    Advanced Care Planning:   Primary Decision Maker: NEXT OF KIN  Code Status/Advance Care Planning: Full code  A discussion was had today regarding advanced directives. Concepts specific to code status, artifical feeding and hydration, continued IV antibiotics and rehospitalization was had.   The difference between a aggressive medical intervention path and a palliative comfort care path for this patient at this time was had.   Decisions/Changes to ACP: None today  Assessment & Plan:   Impression: 88 year old female with advanced dementia now presenting with metabolic encephalopathy likely multifactorial including UTI, malnutrition, recent falls, progression of dementia/sudden progression of dementia given acute illness.  At this point one of her sons keeps discussing that she was previously mobile and his goal is to get her mobile again.  Her care is provided at home by 2 sons, primarily [place].  [name] Dyer shares that he does not think that he can care for her like this at home.  She may end up requiring long-term care placement eventually.  However, there may be some improvement if she has an element of delirium due to acute illness that could improve.  However, I have explained to [place] that she will likely continue to decline, possibly rapidly.  He seems to understand.  Overall prognosis poor.  SUMMARY OF RECOMMENDATIONS   Remain full code Try to discuss code status with son [name] Dyer [facility] referral for outpatient palliative care for continued [place] discussions Continued family support and education PMT will continue to follow  Symptom Management:  Per primary team PMT is available to assist as needed  Prognosis:  Unable to determine  Discharge Planning:  [place] for rehab with Palliative care service follow-up   Discussed with: [name] family, medical team, nursing team    Thank you for allowing [place] to participate in the care of [name] Dyer PMT will continue to support holistically.  Time Total: 75 min  Greater than 50%  of this time was spent counseling and coordinating care related to the above assessment and plan.  Signed by: [name] Field, NP Palliative Medicine Team  Team Phone # [phone] (Nights/Weekends)  06/02/2022, 3:08 PM

## 2022-06-02 NOTE — Progress Notes (Signed)
Modified Barium Swallow Progress Note  Patient Details  Name: [name] Dyer MRN: [id number] Date of Birth: 07/18/1932  Today's Date: 06/02/2022  Modified Barium Swallow completed.  Full report located under Chart Review in the Imaging Section.  Brief recommendations include the following:  Clinical Impression  Patient presenting with a moderately impaired oral phase of swallow and mildly impaired pharyngeal phase of swallow. During oral phase, patient exhibited inefficient and prolonged mastication, inability to move 1/2 of a 13m barium tablet from anterior portion of mouth (SLP had to scoop it out both attempts) and PO residuals in oral cavity post initial swallow with regular solids. During pharyngeal phase of swallow, patient exhibited swallow initiation delay to pyriform sinus with thin liquids, vallecular sinus with regular solids. Trace amount of pharyngeal residuals observed in vallecular sinus and posterior pharyngeal wall with puree solids but no significant amount of residuals observed with puree solids or thin liquids. No instances of penetration or aspiration observed with any tested consistency. SLP is recommending downgrade to Dys 2 (fine chopped) solids, continue with thin liquids.   Swallow Evaluation Recommendations       SLP Diet Recommendations: Dysphagia 2 (Fine chop) solids;Thin liquid   Liquid Administration via: Cup;Straw   Medication Administration: Crushed with puree   Supervision: Full supervision/cueing for compensatory strategies;Staff to assist with self feeding   Compensations: Minimize environmental distractions;Slow rate;Small sips/bites   Postural Changes: Seated upright at 90 degrees   Oral Care Recommendations: Oral care BID;Staff/trained caregiver to provide oral care        JSonia Baller MA, CCC-SLP Speech Therapy

## 2022-06-02 NOTE — Plan of Care (Signed)
  Problem: Safety: Goal: Ability to remain free from injury will improve Outcome: Progressing   Problem: Activity: Goal: Risk for activity intolerance will decrease Outcome: Not Progressing   Problem: Clinical Measurements: Goal: Will remain free from infection Outcome: Adequate for Discharge

## 2022-06-02 NOTE — Progress Notes (Addendum)
PROGRESS NOTE   [name] Dyer  IRS:[id number]    DOB: [birth year]    DOA: 05/29/2022  PCP: [name], NP   I have briefly reviewed patients previous medical records in [facility].  Chief Complaint  Patient presents with   Medication Reaction    Brief Narrative:  88 year old female, lives with her sons who between them are able to provide 24/7 supervision and assistance, ambulates with a walker but lately has been using a wheelchair, medical history significant for moderate to severe dementia, chronic anemia, chronic right heart failure, hyperlipidemia, hypertension, permanent atrial fibrillation chronically on Xarelto, stage 3B CKD with baseline creatinine 1.5-2, at baseline does okay in the daytime, more confused in the nights, unable to consistently recognize family, presented with complaints of 4 to 5 days history of worsening confusion and somnolence.  Seen by PCP and started on Macrobid for Enterococcus Faecium UTI, after 2 doses, the next day noted significant bilateral lower extremity rash and no significant improvement in mental status, presented to ED.  Per family, AMS similar to when she had prior episodes of UTI.  Admitted for acute metabolic encephalopathy and suspected acute cystitis.  Despite correction of metabolic abnormalities, treatment of UTI, although mental status has improved, probably not yet at baseline and patient profoundly weak, fluctuating/variable oral intake and general failure to thrive, concerned about dementia progression.  Long discussion with patient's next son, daughter-in-law at bedside, regarding dementia course, strongly recommended changing CODE STATUS, palliative care consultation and advised them that patient from a medical standpoint is optimized for DC to SNF.  They are considering SNF placement.  Also advised them that eventually she may be a LTC.   Assessment & Plan:  Principal Problem:   Acute metabolic encephalopathy Active Problems:    Permanent atrial fibrillation (HCC)   Hypertension   Hyperlipidemia   Acute cystitis   Rash   Severe sepsis (HCC)   Chronic systolic CHF (congestive heart failure) (HCC)   Acute metabolic encephalopathy: Due to acute cystitis, dehydration, AKI complicating underlying advanced dementia.  Prior history of same. Delirium precautions. CT head without acute findings. UDS negative.  Ammonia unremarkable. Yesterday one of the sons was by to visit patient and advised that her mental status was again not as good as the day prior. Despite treating for metabolic causes and UTI, although mental status has improved some, ongoing fluctuations in mental status.  Concerned about progressive dementia and long discussion with the next son who is here with his wife this morning.  Please see above.  Moreover, even after correction of all above, it may take several days for mental status changes to rebound.  Enterococcus faecium> ESBL Klebsiella pneumonia acute cystitis: OP urine culture 5/26 showed Enterococcus and sensitive to ampicillin, vancomycin and nitrofurantoin. Received 3 doses of nitrofurantoin.  Developed significant lower extremity rash.  Nitrofurantoin discontinued at admission. Treated briefly with IV ampicillin but admission urine culture shows ESBL Klebsiella, only sensitive to gentamicin and imipenem. Hard to differentiate if this is true UTI or colonization.  Family insistent that in the past when she was treated for this, AMS improved.  Therefore we will continue to treat for UTI. As discussed with pharmacy 6/3, ampicillin changed to meropenem, continue while hospitalized and may be a dose of fosfomycin at time of discharge. Blood cultures negative to date. Sepsis ruled out. IV ampicillin 6/1 - 6/3, meropenem 6/3 >.  Continue IV meropenem while hospitalized and then can discharge without antibiotics or can give a  dose of fosfomycin before DC.  Bilateral legs rash: Reportedly started  overnight after initiating Macrobid x2 doses. Possible Macrobid allergy-added to allergy list Slow improvement and appears more than 50% improved compared to admission.  Expect gradual recovery. Unable to get platelet count, platelets clumping.  As per recommendations, will check CBC in citrate for tomorrow.  Dehydration: As per nursing 6/5, ate 75% of her breakfast, drank all of her orange juice.  However upon chart review, fluctuating and sometimes poor oral intake.  10% oral intake charted several times yesterday.  Acute kidney injury complicating stage IIIb CKD Baseline creatinine probably in the 1.5-1.7 range. Presented with creatinine of 1.9. Hydrated with IV fluids and creatinine gradually improving, down to 1.11.  Elevated lactate: Secondary to dehydration and AKI.  Resolved.  Hypokalemia: Now replaced.  Magnesium 2.4  Chronic anemia: Stable.  Chronic right heart failure:  Clinically dehydrated on arrival.  Holding Lasix and metolazone. Continue to hold diuretics for now.  At time of discharge, consider as needed diuretics rather than standing dose.  Hyperlipidemia: Continue statins  Permanent A-fib Continue home dose of atenolol and Xarelto.  Discussed extensively with son that if she has frequent falls then her outpatient providers will need to carefully weigh the benefits of anticoagulation versus risks of catastrophic bleeding.  He verbalized understanding.  Essential hypertension: Continue atenolol.  Controlled.  Advanced dementia: Reportedly used to follow-up with [facility] and has been released. Continue Aricept and Namenda. Suspect progressive dementia.  Falls at home: PT evaluated and recommended SNF.  On 6/5, the son with Parkinson's and his wife were by to visit patient.  He indicated that he provides most of the care while his brother works.  He indicates that he will not be able to provide the care that she needs and is considering SNF  and will speak with his brother.  Advised him to also discuss with his brother regarding CODE STATUS.  Dysphagia: Per SLP, continue dysphagia 3 diet and thin liquids.  Patient tolerating well per RN.  Hemorrhoids: On 6/4 had a large soft BM with some terminal blood from large hemorrhoids witnessed by RN.  Started Anusol HC suppositories.  Stool softeners.  Adult failure to thrive: Secondary to very advanced age, frail physical health, multiple comorbidities and advanced dementia. Palliative care consulted, this can be achieved here if she is still in the hospital or at the nursing facility.  Body mass index is 22.06 kg/m.   DVT prophylaxis: SCDs Start: 05/29/22 2042     Code Status: Full Code:  Family Communication: [name] son and his wife at bedside along with RN. Disposition:  Status is: Inpatient  Patient is medically optimized for DC to SNF pending bed and insurance authorization.     Consultants:   None  Procedures:   None  Antimicrobials:   IV ampicillin-DC'd 6/3 IV meropenem 6/3 >  Subjective:  Very poor historian.  Alert and oriented only to self.  Keeps repeating same thing again and again.  Barely following any instructions.  As per RN, consumed 75% of her breakfast.  No acute issues overnight.  No further BMs or blood in stool.  Objective:   Vitals:   06/01/22 1425 06/01/22 2106 06/02/22 0459 06/02/22 0913  BP: 133/81 (!) 137/112 (!) 141/95 (!) 136/91  Pulse: (!) 106 (!) 109 100 89  Resp: _0 Temp: 98.7 F (37.1 C) 98.8 F (37.1 C) (!) 97.5 F (36.4 C)   TempSrc: Axillary Oral Oral  SpO2: 95% 95% 96%   Weight:      Height:        General exam: Very elderly female, moderately built and frail, sitting up comfortably in bed without distress.  Oral mucosa moist Respiratory system: Clear to auscultation.  No increased work of breathing. Cardiovascular system: S1 and S2 heard, irregularly irregular.  No JVD, murmurs or pedal edema.  Telemetry  has been discontinued. Gastrointestinal system: Abdomen is nondistended, soft and nontender. No organomegaly or masses felt. Normal bowel sounds heard. Central nervous system: Mental status as noted above. No focal neurological deficits. Extremities: Symmetric 5 x 5 power. Skin: Bilateral leg with much improved petechial rash compared to admission.  Almost better by 50% if not more.   Psychiatry: Judgement and insight impaired. Mood & affect flat.      Data Reviewed:   I have personally reviewed following labs and imaging studies   CBC: Recent Labs  Lab 05/29/22 1630 05/29/22 2031 05/30/22 0422 05/31/22 0524 06/02/22 0429  WBC 13.8*  --  9.1 6.5 6.6  NEUTROABS 13.0*  --  7.9*  --   --   HGB 11.3*  --  10.0* 10.2* 11.0*  HCT 35.7*  --  31.1* 32.1* 35.5*  MCV 91.3  --  89.9 90.2 91.7  PLT PLATELET CLUMPS NOTED ON SMEAR, UNABLE TO ESTIMATE   < > 158 PLATELET CLUMPS NOTED ON SMEAR, UNABLE TO ESTIMATE PLT CLUMPING NOTED, COLLECT IN CITRATE FOR CBC   < > = values in this interval not displayed.    Basic Metabolic Panel: Recent Labs  Lab 05/29/22 1536 05/29/22 1727 05/30/22 0422 05/31/22 0524 06/01/22 0522 06/02/22 0429  NA 133*  --  135 133* 138 140  K 3.5  --  3.3* 3.1* 3.1* 4.3  CL 88*  --  94* 95* 100 106  CO2 30  --  _0 GLUCOSE 123*  --  102* 101* 101* 104*  BUN 59*  --  53* 51* 44* 33*  CREATININE 1.94*  --  1.59* 1.63* 1.29* 1.11*  CALCIUM 9.1  --  8.6* 8.4* 8.8* 8.9  MG  --  2.4 2.6*  --   --  2.4    Liver Function Tests: Recent Labs  Lab 05/29/22 1536 05/30/22 0422 06/02/22 0429  AST _1 ALT _2 ALKPHOS 106 76 93  BILITOT 1.9* 1.5* 1.5*  PROT 8.2* 6.5 6.5  ALBUMIN 3.9 3.0* 2.9*    CBG: No results for input(s): GLUCAP in the last 168 hours.  Microbiology Studies:   Recent Results (from the past 240 hour(s))  Urine Culture     Status: Abnormal   Collection Time: 05/23/22  3:48 PM   Specimen: Urine  Result Value Ref Range  Status   MICRO NUMBER: [id number]  Final   SPECIMEN QUALITY: Adequate  Final   Sample Source URINE, CLEAN CATCH  Final   STATUS: FINAL  Final   ISOLATE 1: Enterococcus faecium (A)  Final    Comment: Greater than 100,000 CFU/mL of Enterococcus faecium   COMMENT:   Final    Additional non-predominating organism(s) isolated. These organisms, commonly found on external and internal genitalia, are considered colonizers. No further testing performed.      Susceptibility   Enterococcus faecium - URINE CULTURE POSITIVE 1    AMPICILLIN <=2 Sensitive     VANCOMYCIN <=0.5 Sensitive     NITROFURANTOIN* 32 Sensitive      * Legend: S =  Susceptible  I = Intermediate R = Resistant  NS = Not susceptible * = Not tested  NR = Not reported **NN = See antimicrobic comments   Blood Culture (routine x 2)     Status: None (Preliminary result)   Collection Time: 05/29/22  3:36 PM   Specimen: BLOOD  Result Value Ref Range Status   Specimen Description   Final    BLOOD BLOOD LEFT ARM Performed at [facility] [address], [place], [place] [address]    Special Requests   Final    BOTTLES DRAWN AEROBIC AND ANAEROBIC Blood Culture results may not be optimal due to an inadequate volume of blood received in culture bottles Performed at [facility] [address], [place], [place] [address]    Culture   Final    NO GROWTH 4 DAYS Performed at [facility], [place] [address], [place], [place] [address]    Report Status PENDING  Incomplete  Blood Culture (routine x 2)     Status: None (Preliminary result)   Collection Time: 05/29/22  5:03 PM   Specimen: BLOOD  Result Value Ref Range Status   Specimen Description   Final    BLOOD BLOOD RIGHT FOREARM Performed at [facility] [address], [place], [place] [address]    Special Requests   Final    BOTTLES DRAWN AEROBIC AND ANAEROBIC Blood Culture results may not be optimal due to an inadequate  volume of blood received in culture bottles Performed at [facility] [address], [place], [place] [address]    Culture   Final    NO GROWTH 4 DAYS Performed at [facility], [place] [address], [place], [place] [address]    Report Status PENDING  Incomplete  Urine Culture     Status: Abnormal   Collection Time: 05/29/22  6:29 PM   Specimen: In/Out Cath Urine  Result Value Ref Range Status   Specimen Description   Final    IN/OUT CATH URINE Performed at [facility] [address], [place], [place] [address]    Special Requests   Final    NONE Performed at [facility], [place] [address], [place], [place] [address]    Culture (A)  Final    >=100,000 COLONIES/mL KLEBSIELLA PNEUMONIAE Confirmed Extended Spectrum Beta-Lactamase Producer (ESBL).  In bloodstream infections from ESBL organisms, carbapenems are preferred over piperacillin/tazobactam. They are shown to have a lower risk of mortality.    Report Status 05/31/2022 FINAL  Final   Organism ID, Bacteria KLEBSIELLA PNEUMONIAE (A)  Final      Susceptibility   Klebsiella pneumoniae - MIC*    AMPICILLIN >=32 RESISTANT Resistant     CEFAZOLIN >=64 RESISTANT Resistant     CEFEPIME >=32 RESISTANT Resistant     CEFTRIAXONE >=64 RESISTANT Resistant     CIPROFLOXACIN >=4 RESISTANT Resistant     GENTAMICIN <=1 SENSITIVE Sensitive     IMIPENEM <=0.25 SENSITIVE Sensitive     NITROFURANTOIN 64 INTERMEDIATE Intermediate     TRIMETH/SULFA >=320 RESISTANT Resistant     AMPICILLIN/SULBACTAM >=32 RESISTANT Resistant     PIP/TAZO 32 INTERMEDIATE Intermediate     * >=100,000 COLONIES/mL KLEBSIELLA PNEUMONIAE    Radiology Studies:  No results found.  Scheduled Meds:    allopurinol  100 mg Oral Daily   atenolol  50 mg Oral Daily   calcium carbonate  1,250 mg Oral Q breakfast  cholecalciferol  2,000 Units Oral Daily   docusate sodium  100 mg Oral BID   donepezil  10 mg Oral  QHS   hydrocortisone  25 mg Rectal BID   memantine  5 mg Oral BID   pantoprazole  40 mg Oral Daily   potassium chloride SA  40 mEq Oral Daily   Rivaroxaban  15 mg Oral Q supper   rosuvastatin  5 mg Oral Daily   saccharomyces boulardii  250 mg Oral BID    Continuous Infusions:    meropenem (MERREM) IV 1 g (06/02/22 0516)     LOS: 4 days     Vernell Leep, MD,  FACP, [facility], [facility], [facility] (Care Management Physician Certified) [place]  To contact the attending provider between 7A-7P or the covering provider during after hours 7P-7A, please log into the web site [email or web address] and access using universal  password for that web site. If you do not have the password, please call the hospital operator.  06/02/2022, 12:27 PM

## 2022-06-03 DIAGNOSIS — R4182 Altered mental status, unspecified: Secondary | ICD-10-CM | POA: Diagnosis not present

## 2022-06-03 DIAGNOSIS — N3000 Acute cystitis without hematuria: Secondary | ICD-10-CM | POA: Diagnosis not present

## 2022-06-03 DIAGNOSIS — I5022 Chronic systolic (congestive) heart failure: Secondary | ICD-10-CM | POA: Diagnosis not present

## 2022-06-03 DIAGNOSIS — G9341 Metabolic encephalopathy: Secondary | ICD-10-CM | POA: Diagnosis not present

## 2022-06-03 LAB — CBC WITH DIFFERENTIAL/PLATELET
Abs Immature Granulocytes: 0.09 10*3/uL — ABNORMAL HIGH (ref 0.00–0.07)
Basophils Absolute: 0.1 10*3/uL (ref 0.0–0.1)
Basophils Relative: 1 %
Eosinophils Absolute: 0.4 10*3/uL (ref 0.0–0.5)
Eosinophils Relative: 5 %
HCT: 36.1 % (ref 36.0–46.0)
Hemoglobin: 11.4 g/dL — ABNORMAL LOW (ref 12.0–15.0)
Immature Granulocytes: 1 %
Lymphocytes Relative: 9 %
Lymphs Abs: 0.8 10*3/uL (ref 0.7–4.0)
MCH: 28.7 pg (ref 26.0–34.0)
MCHC: 31.6 g/dL (ref 30.0–36.0)
MCV: 90.9 fL (ref 80.0–100.0)
Monocytes Absolute: 0.9 10*3/uL (ref 0.1–1.0)
Monocytes Relative: 11 %
Neutro Abs: 5.8 10*3/uL (ref 1.7–7.7)
Neutrophils Relative %: 73 %
Platelets: DECREASED 10*3/uL (ref 150–400)
RBC: 3.97 MIL/uL (ref 3.87–5.11)
RDW: 19.3 % — ABNORMAL HIGH (ref 11.5–15.5)
WBC: 8 10*3/uL (ref 4.0–10.5)
nRBC: 0 % (ref 0.0–0.2)

## 2022-06-03 LAB — CULTURE, BLOOD (ROUTINE X 2)
Culture: NO GROWTH
Culture: NO GROWTH

## 2022-06-03 MED ORDER — CALCIUM CARBONATE ANTACID 500 MG PO CHEW
2.0000 | CHEWABLE_TABLET | Freq: Every day | ORAL | Status: DC
  Administered 2022-06-06 – 2022-06-09 (×4): 400 mg via ORAL
  Filled 2022-06-03 (×5): qty 2

## 2022-06-03 MED ORDER — POTASSIUM CHLORIDE 20 MEQ PO PACK
40.0000 meq | PACK | Freq: Every day | ORAL | Status: DC
  Administered 2022-06-05: 40 meq via ORAL
  Filled 2022-06-03: qty 2

## 2022-06-03 NOTE — Progress Notes (Signed)
[facility]  PROGRESS NOTE  [name] Dyer SYP:[id number] DOB: 1932/01/27 DOA: 05/29/2022 PCP: [name], NP   Brief HPI:   88 year old female with a history of moderate to severe dementia, chronic anemia, chronic right heart failure, hyperlipidemia, hypertension, permanent atrial fibrillation chronically on Xarelto, stage III CKD with baseline creatinine 1.5 to2, presented with complaints of 4 to 5 days a history of worsening confusion and somnolence. Seen by PCP and started on Macrobid for Enterococcus Faecium UTI, after 2 doses, the next day noted significant bilateral lower extremity rash and no significant improvement in mental status, presented to ED. mental status has somewhat improved with treatment of UTI, not back to baseline.   Subjective   Patient seen and examined, continues to be confused.   Assessment/Plan:     Acute metabolic encephalopathy -Due to cystitis/dehydration/AKI complicating underlying advanced dementia -Delirium precautions -CT head was unremarkable -UDS negative -Despite treatment of UTI, mental status not back to baseline although it has improved  Enterococcus faecium/ESBL Klebsiella pneumoniae UTI -Urine culture from 5/26 showed Enterococcus sensitive to ampicillin, vancomycin and nitrofurantoin -Patient developed lower extremity rash after she received 3 doses of nitrofurantoin -Nitrofurantoin was discontinued on admission but admission urine culture grew ESBL Klebsiella sensitive to gentamicin and imipenem -Patient started on IV meropenem while hospitalized -We will give 1 dose of fosfomycin before discharge when ready to be discharged.  Bilateral legs rash -Started after patient received Macrobid 2 doses -Likely Macrobid allergy -Slowly improving  Acute kidney injury on CKD stage IIIb -Baseline creatinine 1.5-1.7 -Presented with creatinine 1.9 -Improved to 1.11 with IV hydration  Hypokalemia -Replete  Chronic right heart  failure -Lasix and metolazone on hold -We will start as needed diuretics at discharge  Hyperlipidemia -Continue statins  Permanent atrial fibrillation -Continue home dose of atenolol, Xarelto  Hypertension -Blood pressure controlled -Continue atenolol  Advanced dementia -Continue Aricept, Namenda -Follows [facility] neurology as outpatient  Dysphagia -Speech therapy evaluation obtained -Started on dysphagia 3 diet with thin liquids  Hemorrhoids -Patient had soft BM with some terminal blood from large hemorrhoids witnessed by RN on 06/01/2022 -Started on Anusol HC suppository, stool softeners  Adult failure to thrive: -Secondary to very advanced age, frail physical health, multiple comorbidities and advanced dementia. Palliative care consulted    Medications     allopurinol  100 mg Oral Daily   atenolol  50 mg Oral Daily   [START ON 06/04/2022] calcium carbonate  2 tablet Oral QAC breakfast   cholecalciferol  2,000 Units Oral Daily   docusate sodium  100 mg Oral BID   donepezil  10 mg Oral QHS   hydrocortisone  25 mg Rectal BID   memantine  5 mg Oral BID   pantoprazole  40 mg Oral Daily   [START ON 06/04/2022] potassium chloride  40 mEq Oral Daily   Rivaroxaban  15 mg Oral Q supper   rosuvastatin  5 mg Oral Daily   saccharomyces boulardii  250 mg Oral BID     Data Reviewed:   CBG:  No results for input(s): GLUCAP in the last 168 hours.  SpO2: 94 % O2 Flow Rate (L/min): 1 L/min    Vitals:   06/02/22 2123 06/03/22 0548 06/03/22 0935 06/03/22 1254  BP: (!) 144/75 (!) 142/98 (!) 145/82 131/83  Pulse: 87 98 96 (!) 110  Resp: (!) _0 Temp: 98.6 F (37 C) 98.4 F (36.9 C)  97.8 F (36.6 C)  TempSrc: Oral Axillary  Axillary  SpO2: 90% 92%  94%  Weight:      Height:          Data Reviewed:  Basic Metabolic Panel: Recent Labs  Lab 05/29/22 1536 05/29/22 1727 05/30/22 0422 05/31/22 0524 06/01/22 0522 06/02/22 0429  NA 133*  --  135 133* 138  140  K 3.5  --  3.3* 3.1* 3.1* 4.3  CL 88*  --  94* 95* 100 106  CO2 30  --  _0 GLUCOSE 123*  --  102* 101* 101* 104*  BUN 59*  --  53* 51* 44* 33*  CREATININE 1.94*  --  1.59* 1.63* 1.29* 1.11*  CALCIUM 9.1  --  8.6* 8.4* 8.8* 8.9  MG  --  2.4 2.6*  --   --  2.4    CBC: Recent Labs  Lab 05/29/22 1630 05/29/22 2031 05/30/22 0422 05/31/22 0524 06/02/22 0429 06/03/22 0817  WBC 13.8*  --  9.1 6.5 6.6 8.0  NEUTROABS 13.0*  --  7.9*  --   --  5.8  HGB 11.3*  --  10.0* 10.2* 11.0* 11.4*  HCT 35.7*  --  31.1* 32.1* 35.5* 36.1  MCV 91.3  --  89.9 90.2 91.7 90.9  PLT PLATELET CLUMPS NOTED ON SMEAR, UNABLE TO ESTIMATE PLATELET CLUMPS NOTED ON SMEAR, UNABLE TO ESTIMATE 158 PLATELET CLUMPS NOTED ON SMEAR, UNABLE TO ESTIMATE PLT CLUMPING NOTED, COLLECT IN CITRATE FOR CBC PLATELET CLUMPS NOTED ON SMEAR, COUNT APPEARS DECREASED    LFT Recent Labs  Lab 05/29/22 1536 05/30/22 0422 06/02/22 0429  AST _1 ALT _2 ALKPHOS 106 76 93  BILITOT 1.9* 1.5* 1.5*  PROT 8.2* 6.5 6.5  ALBUMIN 3.9 3.0* 2.9*     Antibiotics: Anti-infectives (From admission, onward)    Start     Dose/Rate Route Frequency Ordered Stop   05/31/22 1700  meropenem (MERREM) 1 g in sodium chloride 0.9 % 100 mL IVPB        1 g 200 mL/hr over 30 Minutes Intravenous Every 12 hours 05/31/22 1605     05/29/22 2100  ampicillin (OMNIPEN) 1 g in sodium chloride 0.9 % 100 mL IVPB  Status:  Discontinued        1 g 300 mL/hr over 20 Minutes Intravenous 2 times daily 05/29/22 2016 05/29/22 2020   05/29/22 2100  ampicillin (OMNIPEN) 2 g in sodium chloride 0.9 % 100 mL IVPB  Status:  Discontinued        2 g 300 mL/hr over 20 Minutes Intravenous 2 times daily 05/29/22 2020 05/31/22 1605        DVT prophylaxis: SCDs, Xarelto he  Code Status: Full code  Family Communication: No family at bedside   CONSULTS    Objective    Physical Examination:  General-appears in no acute  distress Heart-S1-S2, regular, no murmur auscultated Lungs-clear to auscultation bilaterally, no wheezing or crackles auscultated Abdomen-soft, nontender, no organomegaly Extremities-no edema in the lower extremities Neuro-alert, oriented to self only   Status is: Inpatient:          Oswald Hillock   [facility] If 7PM-7AM, please contact night-coverage at [email or web address], Office  [phone]   06/03/2022, 4:56 PM  LOS: 5 days

## 2022-06-03 NOTE — TOC Progression Note (Signed)
Transition of Care [facility]) - Progression Note    Patient Details  Name: [name] Dyer MRN: [id number] Date of Birth: Sep 16, 1932  [facility] of [facility] CM/SW Contact  Kathreen Dileo, Juliann Pulse, RN Phone Number: 06/03/2022, 12:59 PM  Clinical Narrative:Spoke to son [name] about choice-he will make phone calls & let me know by tomorrow-encouraged to inform me as soon as possible so that I can start auth for the facility of choice.       Expected Discharge Plan: [place] Barriers to Discharge: Continued Medical Work up  Expected Discharge Plan and Services Expected Discharge Plan: [place] In-house Referral: Clinical Social Work   Post Acute Care Choice: [place] arrangements for the past 2 months: Single Family Home                           HH Arranged: RN, PT, OT, Speech Therapy HH Agency: [facility] [place] Determinants of Health (SDOH) Interventions    Readmission Risk Interventions    05/31/2022    5:13 PM  Readmission Risk Prevention Plan  Transportation Screening Complete  Medication Review (RN Care Manager) Referral to Pharmacy  PCP or Specialist appointment within 3-5 days of discharge Complete  HRI or Home Care Consult Complete  SW Recovery Care/Counseling Consult Complete  Palliative Care Screening Not [place] Complete

## 2022-06-03 NOTE — Progress Notes (Signed)
Full note to follow.  SLP modified diet to decrease aspiration risk = as per MBS yesterday her swallow was delayed to pyriform sinus with thin thus may allow aspiration if pt is not properly positioned.     Pt with significant oral deficits - with poor oral transiting clearance- requiring puree to transit solids requiring mastication.   Kathleen Lime, MS [facility] SLP Acute Rehab Services Office [phone] Pager [phone]

## 2022-06-03 NOTE — Progress Notes (Signed)
Speech Language Pathology Treatment: Dysphagia  Patient Details Name: [name] Dyer MRN: [id number] DOB: Aug 18, 1932 Today's Date: 06/03/2022 Time: 0086-7619 SLP Time Calculation (min) (ACUTE ONLY): 20 min  Assessment / Plan / Recommendation Clinical Impression  Pt seen for skilled SLP therapy due to RN reporting significant concerns with pt consistently coughing/choking with intake- more with liquids than solids per notes.  Upon review of chart, pt found to be coughing during [facility] January 2023 and from yesterday as well - without airway infiltration.   Baseline cough observed while lying pt flat for repositioning.    Today pt alert but her dementia prevents her from providing education re: her dysphagia.  She willingly consumed portion of lunch administered by this SLP including mashed potatoes, shredded beef and thickened juice.    Cough also noted post-swallow with approximately 20% of boluses - occurring more with liquids than solid.  Prolonged mastication and impaired oral clearance with solids requiring mastication, requiring several boluses of puree to clear.  Pt with much improved tolerance of purees than solids per NT.    Given pt's swallow reflex was delayed to pyriform sinus with thin liquids, her risk of aspiration with thin liquids is elevated - thus for maximum comfort, will modify diet to nectar liquids.  Currently pt is a laborious feeder and with level of oral deficits, pureed diet will be tolerated best. Anticipate pt will be appropriate for dietary advancement upon turn to baseline cognitive and medical function.    Recommend strict dysphagia and esophageal precautions including assuring swallow is triggered via observation of laryngeal elevation, assuring pt upright and staying partially upright after meals.  Will follow up briefly for readiness for dietary advancement, family education, etc.     HPI HPI: 88 year old female, lives with her sons admitted worsening confusion and  somnolence. Found to have metabolic encephalopathy and suspected acute cystitis. PMH:  moderate to severe dementia, chronic anemia, chronic right heart failure, hyperlipidemia, hypertension, permanent atrial fibrillation chronically, stage IV CKD.  Pt had MBS in 1/23 "[name] oropharyngeal swallowing is functional with adequate efficiency and no aspiration and occasional throat clearing/cough with clear airway.  BSE 04/27/22 + coughing (dry) with possibility of esophageal dysphagia recommended MBS however not charted after 04/27/22( possibly discharged prior to scheduling). RN informed SLP of concern for pt having coughing with intake - liquids more than foods.      SLP Plan         Recommendations for follow up therapy are one component of a multi-disciplinary discharge planning process, led by the attending physician.  Recommendations may be updated based on patient status, additional functional criteria and insurance authorization.    Recommendations  Diet recommendations: Dysphagia 1 (puree);Nectar-thick liquid Medication Administration: Crushed with puree Compensations: Minimize environmental distractions;Slow rate;Small sips/bites Postural Changes and/or Swallow Maneuvers: Seated upright 90 degrees;Upright 30-60 min after meal                Oral Care Recommendations: Oral care BID Follow Up Recommendations: Skilled nursing-short term rehab (<3 hours/day) Assistance recommended at discharge: Frequent or constant Supervision/Assistance SLP Visit Diagnosis: Dysphagia, oropharyngeal phase (R13.12)          Kathleen Lime, MS [facility] SLP Acute Rehab Services Office [phone] Pager [phone]  Macario Golds  06/03/2022, 1:34 PM

## 2022-06-04 MED ORDER — HALOPERIDOL LACTATE 5 MG/ML IJ SOLN
2.0000 mg | Freq: Once | INTRAMUSCULAR | Status: AC
  Administered 2022-06-04: 2 mg via INTRAVENOUS
  Filled 2022-06-04: qty 1

## 2022-06-04 MED ORDER — VALPROATE SODIUM 100 MG/ML IV SOLN
250.0000 mg | Freq: Two times a day (BID) | INTRAVENOUS | Status: DC
  Administered 2022-06-04 – 2022-06-05 (×3): 250 mg via INTRAVENOUS
  Filled 2022-06-04 (×7): qty 2.5

## 2022-06-04 MED ORDER — DIVALPROEX SODIUM 125 MG PO CSDR: 250.0000 mg | DELAYED_RELEASE_CAPSULE | Freq: Two times a day (BID) | ORAL | Status: DC

## 2022-06-04 NOTE — TOC Progression Note (Addendum)
Transition of Care [facility]) - Progression Note    Patient Details  Name: [name] Dyer MRN: [id number] Date of Birth: Nov 25, 1932  Transition of Care [facility]) CM/SW Contact  Autumne Kallio, Juliann Pulse, RN Phone Number: 06/04/2022, 10:13 AM  Clinical Narrative:  [facility] chosen-await PT prior starting auth.MD notified.  -3p-Initiated [place] for [place] JD#[id number] auth.    Expected Discharge Plan: Skilled Nursing Facility Barriers to Discharge: Insurance Authorization  Expected Discharge Plan and Services Expected Discharge Plan: [place] In-house Referral: Clinical Social Work   Post Acute Care Choice: [place] arrangements for the past 2 months: Single Family Home                           HH Arranged: RN, PT, OT, Speech Therapy HH Agency: [facility] Determinants of Health (SDOH) Interventions    Readmission Risk Interventions    05/31/2022    5:13 PM  Readmission Risk Prevention Plan  Transportation Screening Complete  Medication Review (RN Care Manager) Referral to Pharmacy  PCP or Specialist appointment within 3-5 days of discharge Complete  HRI or Home Care Consult Complete  SW Recovery Care/Counseling Consult Complete  [place] Complete

## 2022-06-04 NOTE — Progress Notes (Signed)
Physical Therapy Treatment Patient Details Name: [name] Dyer MRN: [id number] DOB: [birth year] Today's Date: 06/04/2022   History of Present Illness [name] Dyer is a 88 y.o. female presents with AMS and admitted with acute metabolic encephalopathy. PMH: advanced dementia, chronic anemia, CHF, hyperlipidemia, HTN, permanent afib, stage IV CKD, osteoporosis    PT Comments    General Comments: AxO x 1 only.  Resistant.  Defensive.  Guarded.  Very fearful upon frontal approach.  Can answer simple questions but inconsistant.  Tends to repeat, "I don't care", "why", doesn't matter", "get away".  Limited eye contact.  At one point tearful.  100% VC's to calm and reassure pt.  Unable to correct behavior.  Resistant to all care (vitals, meds, hygiene)  General bed mobility comments: assisted to EOB was very difficult.  Required increased time and reassurance.  Unable to assist "frontal" approach as pt begings to "fight" (yell, kick, swing).  Assisted from behind with use of bed pad to complete swival sit.  Once upright, pt was able to static sit Indep EOB gripping blankets.  Pt yelling, "get away", "don't touch me".  Allowed pt to sit EOB > 15 min to attempt OOB to [facility].  Unable due to behavior.  Assisted back to bed same fashion from BEHIND using bed pad to scoot/swival/shift back to supine. Last week, Physical Therapy was able to get her OOB.   Pt will need SNF level of care.    Recommendations for follow up therapy are one component of a multi-disciplinary discharge planning process, led by the attending physician.  Recommendations may be updated based on patient status, additional functional criteria and insurance authorization.  Follow Up Recommendations  Skilled nursing-short term rehab (<3 hours/day)     Assistance Recommended at Discharge Frequent or constant Supervision/Assistance  Patient can return home with the following Two people to help with walking and/or transfers;A lot of help  with bathing/dressing/bathroom;Assistance with feeding;Assistance with cooking/housework;Assist for transportation;Direct supervision/assist for medications management   Equipment Recommendations  None recommended by PT    Recommendations for Other Services       Precautions / Restrictions Precautions Precautions: Fall Precaution Comments: advanced dementia Restrictions Weight Bearing Restrictions: No     Mobility  Bed Mobility Overal bed mobility: Needs Assistance Bed Mobility: Supine to Sit, Sit to Supine     Supine to sit: Max assist Sit to supine: Max assist   General bed mobility comments: assisted to EOB was very difficult.  Required increased time and reassurance.  Unable to assist "frontal" approach as pt begings to "fight" (yell, kick, swing).  Assisted from behind with use of bed pad to complete swival sit.  Once upright, pt was able to static sit Indep EOB gripping blankets.  Pt yelling, "get away", "don't touch me".  Allowed pt to sit EOB > 15 min to attempt OOB to [facility].  Unable due to behavior.  Assisted back to bed same fashion from BEHIND using bed pad to scoot/swival/shift back to supine.    Transfers                        Ambulation/Gait                   Stairs             Wheelchair Mobility    Modified Rankin (Stroke Patients Only)       Balance  Cognition Arousal/Alertness: Awake/alert Behavior During Therapy: Flat affect Overall Cognitive Status: No family/caregiver present to determine baseline cognitive functioning                                 General Comments: AxO x 1 only.  Resistant.  Defensive.  Guarded.  Very fearful upon frontal approach.  Can answer simple questions but inconsistant.  Tends to repeat, "I don't care", "why", doesn't matter", "get away".  Limited eye contact.  At one point tearful.  100% VC's to calm and reassure pt.   Unable to correct behavior.  Resistant to all care (vitals, meds, hygiene)        Exercises      General Comments        Pertinent Vitals/Pain Pain Assessment Pain Assessment: No/denies pain    Home Living                          Prior Function            PT Goals (current goals can now be found in the care plan section) Progress towards PT goals: Progressing toward goals    Frequency    Min 2X/week      PT Plan Current plan remains appropriate    Co-evaluation              AM-PAC PT "6 Clicks" Mobility   Outcome Measure  Help needed turning from your back to your side while in a flat bed without using bedrails?: A Lot Help needed moving from lying on your back to sitting on the side of a flat bed without using bedrails?: A Lot Help needed moving to and from a bed to a chair (including a wheelchair)?: A Lot Help needed standing up from a chair using your arms (e.g., wheelchair or bedside chair)?: A Lot Help needed to walk in hospital room?: Total Help needed climbing 3-5 steps with a railing? : Total 6 Click Score: 10    End of Session Equipment Utilized During Treatment: Gait belt Activity Tolerance: Other (comment) (limited by behavior) Patient left: in bed;with call bell/phone within reach;with bed alarm set Nurse Communication: Mobility status PT Visit Diagnosis: Unsteadiness on feet (R26.81);Other abnormalities of gait and mobility (R26.89);Muscle weakness (generalized) (M62.81);History of falling (Z91.81)     Time: 0159-9689 PT Time Calculation (min) (ACUTE ONLY): 26 min  Charges:  $Therapeutic Activity: 23-37 mins                     Rica Koyanagi  PTA Acute  Rehabilitation Services Pager      [phone] Office      [phone]

## 2022-06-04 NOTE — Progress Notes (Signed)
Patient refusing all PO meds at this time as well as vital signs throughout this shift. MD [place] successful notified of this. Family aware and at bedside. Pt Disoriented x4, paranoid/worsening agitation. Offered food, drink, meds, tv/music several times & pt strongly refuses.   Pt is not pulling at lines or trying to get out of bed but agitated when HCP's try to provide care/clean pt.

## 2022-06-04 NOTE — Progress Notes (Signed)
SLP Cancellation Note  Patient Details Name: JEMEKA WAGLER MRN: [id number] DOB: [birth year]   Cancelled treatment:       Reason Eval/Treat Not Completed: Other (comment) (pt agitation during shift, pulling at lines, etc, palliative referral pending)  Kathleen Lime, MS [place] Office [phone] Pager [phone]   Macario Golds 06/04/2022, 7:59 PM

## 2022-06-04 NOTE — Care Management Important Message (Signed)
Important Message  Patient Details IM Letter placed in Patients room. Name: [name] Dyer MRN: [id number] Date of Birth: August 16, 1932   Medicare Important Message Given:  Yes     Kerin Salen 06/04/2022, 1:53 PM

## 2022-06-04 NOTE — Progress Notes (Signed)
[facility]  PROGRESS NOTE  LARAMIE GELLES MVH:[id number] DOB: [birth year] DOA: 05/29/2022 PCP: [name], NP   Brief HPI:   88 year old female with a history of moderate to severe dementia, chronic anemia, chronic right heart failure, hyperlipidemia, hypertension, permanent atrial fibrillation chronically on Xarelto, stage III CKD with baseline creatinine 1.5 to2, presented with complaints of 4 to 5 days a history of worsening confusion and somnolence. Seen by PCP and started on Macrobid for Enterococcus Faecium UTI, after 2 doses, the next day noted significant bilateral lower extremity rash and no significant improvement in mental status, presented to ED. mental status has somewhat improved with treatment of UTI, not back to baseline.   Subjective   Patient seen, agitated.  Did not let me examine her.  Pushed my hands away   Assessment/Plan:     Acute metabolic encephalopathy -Due to cystitis/dehydration/AKI complicating underlying advanced dementia -Delirium precautions -CT head was unremarkable -UDS negative -Despite treatment of UTI, mental status not back to baseline  -We will start Depakote to 50 mg IV twice daily, she has been refusing p.o. medications  Enterococcus faecium/ESBL Klebsiella pneumoniae UTI -Urine culture from 5/26 showed Enterococcus sensitive to ampicillin, vancomycin and nitrofurantoin -Patient developed lower extremity rash after she received 3 doses of nitrofurantoin -Nitrofurantoin was discontinued on admission but admission urine culture grew ESBL Klebsiella sensitive to gentamicin and imipenem -Patient started on IV meropenem while hospitalized -We will give 1 dose of fosfomycin before discharge when ready to be discharged.  Bilateral legs rash -Started after patient received Macrobid 2 doses -Likely Macrobid allergy -Slowly improving  Acute kidney injury on CKD stage IIIb -Baseline creatinine 1.5-1.7 -Presented with creatinine  1.9 -Improved to 1.11 with IV hydration  Hypokalemia -Replete  Chronic right heart failure -Lasix and metolazone on hold -We will start as needed diuretics at discharge  Hyperlipidemia -Continue statins  Permanent atrial fibrillation -Continue home dose of atenolol, Xarelto  Hypertension -Blood pressure controlled -Continue atenolol  Advanced dementia with behavior disturbance -Continue Aricept, Namenda -Follows [place] neurology as outpatient -Started on Depakote as above -Might need Haldol as needed for agitation  Dysphagia -Speech therapy evaluation obtained -Diet changed to dysphagia 1 diet with thin liquids  Hemorrhoids -Patient had soft BM with some terminal blood from large hemorrhoids witnessed by RN on 06/01/2022 -Started on Anusol HC suppository, stool softeners  Adult failure to thrive: -Secondary to very advanced age, frail physical health, multiple comorbidities and advanced dementia. Palliative care consulted    Medications     allopurinol  100 mg Oral Daily   atenolol  50 mg Oral Daily   calcium carbonate  2 tablet Oral QAC breakfast   cholecalciferol  2,000 Units Oral Daily   docusate sodium  100 mg Oral BID   donepezil  10 mg Oral QHS   hydrocortisone  25 mg Rectal BID   memantine  5 mg Oral BID   pantoprazole  40 mg Oral Daily   potassium chloride  40 mEq Oral Daily   Rivaroxaban  15 mg Oral Q supper   rosuvastatin  5 mg Oral Daily   saccharomyces boulardii  250 mg Oral BID     Data Reviewed:   CBG:  No results for input(s): GLUCAP in the last 168 hours.  SpO2: 95 % O2 Flow Rate (L/min): 1 L/min    Vitals:   06/03/22 0935 06/03/22 1254 06/03/22 2330 06/04/22 0653  BP: (!) 145/82 131/83 128/86 (!) 135/95  Pulse: 96 (!) 110 97 (!)  103  Resp:  _0 Temp:  97.8 F (36.6 C) 98.6 F (37 C) 98.1 F (36.7 C)  TempSrc:  Axillary Axillary Oral  SpO2:  94% 96% 95%  Weight:      Height:          Data Reviewed:  Basic  Metabolic Panel: Recent Labs  Lab 05/29/22 1536 05/29/22 1727 05/30/22 0422 05/31/22 0524 06/01/22 0522 06/02/22 0429  NA 133*  --  135 133* 138 140  K 3.5  --  3.3* 3.1* 3.1* 4.3  CL 88*  --  94* 95* 100 106  CO2 30  --  _1 GLUCOSE 123*  --  102* 101* 101* 104*  BUN 59*  --  53* 51* 44* 33*  CREATININE 1.94*  --  1.59* 1.63* 1.29* 1.11*  CALCIUM 9.1  --  8.6* 8.4* 8.8* 8.9  MG  --  2.4 2.6*  --   --  2.4    CBC: Recent Labs  Lab 05/29/22 1630 05/29/22 2031 05/30/22 0422 05/31/22 0524 06/02/22 0429 06/03/22 0817  WBC 13.8*  --  9.1 6.5 6.6 8.0  NEUTROABS 13.0*  --  7.9*  --   --  5.8  HGB 11.3*  --  10.0* 10.2* 11.0* 11.4*  HCT 35.7*  --  31.1* 32.1* 35.5* 36.1  MCV 91.3  --  89.9 90.2 91.7 90.9  PLT PLATELET CLUMPS NOTED ON SMEAR, UNABLE TO ESTIMATE PLATELET CLUMPS NOTED ON SMEAR, UNABLE TO ESTIMATE 158 PLATELET CLUMPS NOTED ON SMEAR, UNABLE TO ESTIMATE PLT CLUMPING NOTED, COLLECT IN [place] FOR CBC PLATELET CLUMPS NOTED ON SMEAR, COUNT APPEARS DECREASED    LFT Recent Labs  Lab 05/29/22 1536 05/30/22 0422 06/02/22 0429  AST _2 ALT _3 ALKPHOS 106 76 93  BILITOT 1.9* 1.5* 1.5*  PROT 8.2* 6.5 6.5  ALBUMIN 3.9 3.0* 2.9*     Antibiotics: Anti-infectives (From admission, onward)    Start     Dose/Rate Route Frequency Ordered Stop   05/31/22 1700  meropenem (MERREM) 1 g in sodium chloride 0.9 % 100 mL IVPB        1 g 200 mL/hr over 30 Minutes Intravenous Every 12 hours 05/31/22 1605     05/29/22 2100  ampicillin (OMNIPEN) 1 g in sodium chloride 0.9 % 100 mL IVPB  Status:  Discontinued        1 g 300 mL/hr over 20 Minutes Intravenous 2 times daily 05/29/22 2016 05/29/22 2020   05/29/22 2100  ampicillin (OMNIPEN) 2 g in sodium chloride 0.9 % 100 mL IVPB  Status:  Discontinued        2 g 300 mL/hr over 20 Minutes Intravenous 2 times daily 05/29/22 2020 05/31/22 1605        DVT prophylaxis: SCDs, Xarelto he  Code Status: Full  code  Family Communication: Spoke to patient's son and daughter-in-law at bedside, explained to them that patient is not a good candidate for artificial nutrition with PEG tube.  If she does not improve in next few days then family is open to consider comfort measures and this advanced dementia patient with behavior disturbance.   CONSULTS    Objective    Physical Examination:  General-appears restless  Neuro-alert, confused, agitated   Status is: Inpatient:          Oswald Hillock   [facility] If 7PM-7AM, please contact night-coverage at [email or web address], Office  [phone]   06/04/2022,  4:06 PM  LOS: 6 days

## 2022-06-04 NOTE — Progress Notes (Signed)
Physical Therapy  Cancellation Note  Patient Details Name: [name] Dyer MRN: [id number] DOB: 1932-08-16   Cancelled Treatment:     Pt awake.  Responds to name.  Makes good eye contact.  Currently in bed with breakfast tray 75% eaten.  AxO x 1 only and easily startled/agittated.  Pt unable to properly respond to questions.  Unaware of current time/location.  Approached pt slowly but when I reached to move her call light, she jumped and yelled out.  Attempted to redirect I was there to help her.  When I reached to take her covers down she yelled out again and started to swing her arms.  Pt appears fearful and in defense mode.  Will try again later in day to get her OOB.  Pt has been evaluated with rec for SNF.      Rica Koyanagi  PTA Acute  Rehabilitation Services Pager      [phone] Office      [phone]

## 2022-06-05 LAB — CBC
HCT: 41.4 % (ref 36.0–46.0)
Hemoglobin: 12.4 g/dL (ref 12.0–15.0)
MCH: 28.6 pg (ref 26.0–34.0)
MCHC: 30 g/dL (ref 30.0–36.0)
MCV: 95.6 fL (ref 80.0–100.0)
Platelets: 142 10*3/uL — ABNORMAL LOW (ref 150–400)
RBC: 4.33 MIL/uL (ref 3.87–5.11)
RDW: 20 % — ABNORMAL HIGH (ref 11.5–15.5)
WBC: 12.5 10*3/uL — ABNORMAL HIGH (ref 4.0–10.5)
nRBC: 0 % (ref 0.0–0.2)

## 2022-06-05 LAB — COMPREHENSIVE METABOLIC PANEL
ALT: 15 U/L (ref 0–44)
AST: 27 U/L (ref 15–41)
Albumin: 3.1 g/dL — ABNORMAL LOW (ref 3.5–5.0)
Alkaline Phosphatase: 98 U/L (ref 38–126)
Anion gap: 9 (ref 5–15)
BUN: 41 mg/dL — ABNORMAL HIGH (ref 8–23)
CO2: 21 mmol/L — ABNORMAL LOW (ref 22–32)
Calcium: 9.1 mg/dL (ref 8.9–10.3)
Chloride: 115 mmol/L — ABNORMAL HIGH (ref 98–111)
Creatinine, Ser: 1.39 mg/dL — ABNORMAL HIGH (ref 0.44–1.00)
GFR, Estimated: 36 mL/min — ABNORMAL LOW (ref >60)
Glucose, Bld: 127 mg/dL — ABNORMAL HIGH (ref 70–99)
Potassium: 5.8 mmol/L — ABNORMAL HIGH (ref 3.5–5.1)
Sodium: 145 mmol/L (ref 135–145)
Total Bilirubin: 1.3 mg/dL — ABNORMAL HIGH (ref 0.3–1.2)
Total Protein: 7 g/dL (ref 6.5–8.1)

## 2022-06-05 MED ORDER — HALOPERIDOL LACTATE 5 MG/ML IJ SOLN
2.0000 mg | Freq: Four times a day (QID) | INTRAMUSCULAR | Status: DC | PRN
  Administered 2022-06-05: 2 mg via INTRAVENOUS
  Filled 2022-06-05: qty 1

## 2022-06-05 MED ORDER — DEXTROSE-NACL 5-0.45 % IV SOLN: INTRAVENOUS | Status: DC

## 2022-06-05 NOTE — Progress Notes (Signed)
[facility]  PROGRESS NOTE  [name] Dyer:[id number] DOB: May 15, 1932 DOA: 05/29/2022 PCP: [name], NP   Brief HPI:   88 year old female with a history of moderate to severe dementia, chronic anemia, chronic right heart failure, hyperlipidemia, hypertension, permanent atrial fibrillation chronically on Xarelto, stage III CKD with baseline creatinine 1.5 to2, presented with complaints of 4 to 5 days a history of worsening confusion and somnolence. Seen by PCP and started on Macrobid for Enterococcus Faecium UTI, after 2 doses, the next day noted significant bilateral lower extremity rash and no significant improvement in mental status, presented to ED. mental status has somewhat improved with treatment of UTI, not back to baseline.   Subjective   Patient seen, yelling, "go away " , did not answer my questions or let me examine her.   Assessment/Plan:     Acute metabolic encephalopathy -Due to cystitis/dehydration/AKI complicating underlying advanced dementia -Delirium precautions -CT head was unremarkable -UDS negative -Despite treatment of UTI, mental status not back to baseline  -Started on Depakote to 50 mg IV twice daily with no significant improvement  -She improved after she received 1 dose of Haldol yesterday -We will start Haldol 2 mg IV every 6 hours as needed for agitation -We will consult psychiatry to help with medication of behavior disturbance  Enterococcus faecium/ESBL Klebsiella pneumoniae UTI -Urine culture from 5/26 showed Enterococcus sensitive to ampicillin, vancomycin and nitrofurantoin -Patient developed lower extremity rash after she received 3 doses of nitrofurantoin -Nitrofurantoin was discontinued on admission but admission urine culture grew ESBL Klebsiella sensitive to gentamicin and imipenem -Patient started on IV meropenem while hospitalized -We will give 1 dose of fosfomycin before discharge when ready to be  discharged.  Bilateral legs rash -Started after patient received Macrobid 2 doses -Likely Macrobid allergy -Slowly improving  Acute kidney injury on CKD stage IIIb -Baseline creatinine 1.5-1.7 -Presented with creatinine 1.9 -Improved to 1.11 with IV hydration  Hypokalemia -Replete  Chronic right heart failure -Lasix and metolazone on hold -We will start as needed diuretics at discharge  Hyperlipidemia -Continue statins  Permanent atrial fibrillation -Continue home dose of atenolol, Xarelto  Hypertension -Blood pressure controlled -Continue atenolol  Advanced dementia with behavior disturbance -Continue Aricept, Namenda -Follows [facility] as outpatient -Started on Depakote as above -Continue Haldol 2 mg IV every 6 hours as needed for agitation -Psychiatry consulted for  Dysphagia -Speech therapy evaluation obtained -Diet changed to dysphagia 1 diet with thin liquids  Hemorrhoids -Patient had soft BM with some terminal blood from large hemorrhoids witnessed by RN on 06/01/2022 -Started on Anusol HC suppository, stool softeners  Adult failure to thrive: -Secondary to very advanced age, frail physical health, multiple comorbidities and advanced dementia. Palliative care consulted    Medications     allopurinol  100 mg Oral Daily   atenolol  50 mg Oral Daily   calcium carbonate  2 tablet Oral QAC breakfast   cholecalciferol  2,000 Units Oral Daily   docusate sodium  100 mg Oral BID   donepezil  10 mg Oral QHS   hydrocortisone  25 mg Rectal BID   memantine  5 mg Oral BID   pantoprazole  40 mg Oral Daily   potassium chloride  40 mEq Oral Daily   Rivaroxaban  15 mg Oral Q supper   rosuvastatin  5 mg Oral Daily   saccharomyces boulardii  250 mg Oral BID     Data Reviewed:   CBG:  No results for input(s): "GLUCAP"  in the last 168 hours.  SpO2: 98 % O2 Flow Rate (L/min): 2 L/min    Vitals:   06/04/22 0653 06/04/22 2252 06/05/22 0340 06/05/22  1346  BP: (!) 135/95 (!) 149/97 134/80 (!) 149/84  Pulse: (!) 103 78 (!) 108 (!) 112  Resp: _0 Temp: 98.1 F (36.7 C) 97.7 F (36.5 C) 98.2 F (36.8 C)   TempSrc: Oral Axillary Oral   SpO2: 95%  100% 98%  Weight:      Height:          Data Reviewed:  Basic Metabolic Panel: Recent Labs  Lab 05/29/22 1536 05/29/22 1727 05/30/22 0422 05/31/22 0524 06/01/22 0522 06/02/22 0429  NA 133*  --  135 133* 138 140  K 3.5  --  3.3* 3.1* 3.1* 4.3  CL 88*  --  94* 95* 100 106  CO2 30  --  _1 GLUCOSE 123*  --  102* 101* 101* 104*  BUN 59*  --  53* 51* 44* 33*  CREATININE 1.94*  --  1.59* 1.63* 1.29* 1.11*  CALCIUM 9.1  --  8.6* 8.4* 8.8* 8.9  MG  --  2.4 2.6*  --   --  2.4    CBC: Recent Labs  Lab 05/29/22 1630 05/29/22 2031 05/30/22 0422 05/31/22 0524 06/02/22 0429 06/03/22 0817  WBC 13.8*  --  9.1 6.5 6.6 8.0  NEUTROABS 13.0*  --  7.9*  --   --  5.8  HGB 11.3*  --  10.0* 10.2* 11.0* 11.4*  HCT 35.7*  --  31.1* 32.1* 35.5* 36.1  MCV 91.3  --  89.9 90.2 91.7 90.9  PLT PLATELET CLUMPS NOTED ON SMEAR, UNABLE TO ESTIMATE PLATELET CLUMPS NOTED ON SMEAR, UNABLE TO ESTIMATE 158 PLATELET CLUMPS NOTED ON SMEAR, UNABLE TO ESTIMATE PLT CLUMPING NOTED, COLLECT IN [place] FOR CBC PLATELET CLUMPS NOTED ON SMEAR, COUNT APPEARS DECREASED    LFT Recent Labs  Lab 05/29/22 1536 05/30/22 0422 06/02/22 0429  AST _2 ALT _3 ALKPHOS 106 76 93  BILITOT 1.9* 1.5* 1.5*  PROT 8.2* 6.5 6.5  ALBUMIN 3.9 3.0* 2.9*     Antibiotics: Anti-infectives (From admission, onward)    Start     Dose/Rate Route Frequency Ordered Stop   05/31/22 1700  meropenem (MERREM) 1 g in sodium chloride 0.9 % 100 mL IVPB        1 g 200 mL/hr over 30 Minutes Intravenous Every 12 hours 05/31/22 1605     05/29/22 2100  ampicillin (OMNIPEN) 1 g in sodium chloride 0.9 % 100 mL IVPB  Status:  Discontinued        1 g 300 mL/hr over 20 Minutes Intravenous 2 times daily 05/29/22 2016  05/29/22 2020   05/29/22 2100  ampicillin (OMNIPEN) 2 g in sodium chloride 0.9 % 100 mL IVPB  Status:  Discontinued        2 g 300 mL/hr over 20 Minutes Intravenous 2 times daily 05/29/22 2020 05/31/22 1605        DVT prophylaxis: SCDs, Xarelto he  Code Status: Full code  Family Communication: Spoke to patient's son and daughter-in-law at bedside, explained to them that patient is not a good candidate for artificial nutrition with PEG tube.  If she does not improve in next few days then family is open to consider comfort measures and this advanced dementia patient with behavior disturbance.   CONSULTS    Objective  Physical Examination:  Patient agitated, did not let me examine her   Status is: Inpatient:          Oswald Hillock   [facility] If 7PM-7AM, please contact night-coverage at [email or web address], Office  [phone]   06/05/2022, 2:55 PM  LOS: 7 days

## 2022-06-05 NOTE — Progress Notes (Signed)
Speech Language Pathology Treatment: Dysphagia  Patient Details Name: [name] Dyer MRN: [id number] DOB: 05/20/1932 Today's Date: 06/05/2022 Time: 4854-6270 SLP Time Calculation (min) (ACUTE ONLY): 30 min  Assessment / Plan / Recommendation Clinical Impression  SLP session focused on dysphagia management/education with family.  [name] family present and son was feeding her dinner.  NO coughing noted across entire 30 minute SLP session.  Son is very mindful, observes pt swallow prior to giving more intake.  Youngest son reports pt tolerating her modified diet much better than solids/thin - evidenced by decreased coughing. RN reports pt with no cough during medication administration.   SLP reviewed MBS flouro loops with pt's family - explaning clinical reasoning for short term diet modifications to maximize airway protection with intake.  Poor oral control allows premature spillage of liquids into pharynx on several occasions during MBS and this can incr aspiration risk.  Pill did not transit with pudding despite several attempts and son reports pt will masticate pills at times.  Advised pt's medications be crushed to decrease exposure to poor taste of medications if she masticates them- if not contraindicated, especially.  Anticipate pt will be appropriate for dietary advancement with medical improvement. Given her dementia however,  likely dysphagia will worsen over time and will be variable.  Provided pt's family with several packets of thickener. Pt is not resisting liquids that are slightly thicker at this time, however if her fluid intake becomes compromised - would discuss risks/benefits of thicker liquids.  Informed pt's family to importance of hydration.    HPI HPI: 88 year old female, lives with her sons admitted worsening confusion and somnolence. Found to have metabolic encephalopathy and suspected acute cystitis. PMH:  moderate to severe dementia, chronic anemia, chronic right heart failure,  hyperlipidemia, hypertension, permanent atrial fibrillation chronically, stage IV CKD.  Pt had MBS in 1/23 "[name] oropharyngeal swallowing is functional with adequate efficiency and no aspiration and occasional throat clearing/cough with clear airway.  BSE 04/27/22 + coughing (dry) with possibility of esophageal dysphagia recommended MBS however not charted after 04/27/22( possibly discharged prior to scheduling). RN informed SLP of concern for pt having coughing with intake - liquids more than foods.      SLP Plan  Continue with current plan of care;Other (Comment) (for advancement in diet)      Recommendations for follow up therapy are one component of a multi-disciplinary discharge planning process, led by the attending physician.  Recommendations may be updated based on patient status, additional functional criteria and insurance authorization.    Recommendations  Diet recommendations: Dysphagia 1 (puree);Nectar-thick liquid (tsps thin ok) Liquids provided via: Cup;Straw Medication Administration: Crushed with puree Compensations: Minimize environmental distractions;Slow rate;Small sips/bites Postural Changes and/or Swallow Maneuvers: Seated upright 90 degrees;Upright 30-60 min after meal                Oral Care Recommendations: Oral care BID Follow Up Recommendations: Skilled nursing-short term rehab (<3 hours/day) Assistance recommended at discharge: Frequent or constant Supervision/Assistance SLP Visit Diagnosis: Dysphagia, oropharyngeal phase (R13.12) Plan: Continue with current plan of care;Other (Comment) (for advancement in diet)          Kathleen Lime, MS [place] Office [phone] Pager [phone]  Macario Golds  06/05/2022, 7:00 PM

## 2022-06-06 LAB — POTASSIUM: Potassium: 5.2 mmol/L — ABNORMAL HIGH (ref 3.5–5.1)

## 2022-06-06 MED ORDER — DIVALPROEX SODIUM 125 MG PO CSDR: 250.0000 mg | DELAYED_RELEASE_CAPSULE | Freq: Two times a day (BID) | ORAL | Status: AC

## 2022-06-06 MED ORDER — HYDROCORTISONE ACETATE 25 MG RE SUPP: 25.0000 mg | Freq: Two times a day (BID) | RECTAL | 0 refills | Status: AC | PRN

## 2022-06-06 MED ORDER — ENOXAPARIN SODIUM 60 MG/0.6ML IJ SOSY
60.0000 mg | PREFILLED_SYRINGE | INTRAMUSCULAR | Status: DC
  Administered 2022-06-06 – 2022-06-08 (×3): 60 mg via SUBCUTANEOUS
  Filled 2022-06-06 (×3): qty 0.6

## 2022-06-06 MED ORDER — DOCUSATE SODIUM 100 MG PO CAPS: 100.0000 mg | ORAL_CAPSULE | Freq: Two times a day (BID) | ORAL | 0 refills | Status: AC

## 2022-06-06 MED ORDER — DIVALPROEX SODIUM 125 MG PO CSDR
250.0000 mg | DELAYED_RELEASE_CAPSULE | Freq: Two times a day (BID) | ORAL | Status: DC
  Administered 2022-06-06 – 2022-06-09 (×8): 250 mg via ORAL
  Filled 2022-06-06 (×8): qty 2

## 2022-06-06 NOTE — Care Management Important Message (Signed)
Important Message  Patient Details IM Letter placed in Patients room. Name: [name] Dyer MRN: [id number] Date of Birth: Sep 17, 1932   Medicare Important Message Given:  Yes     Kerin Salen 06/06/2022, 12:07 PM

## 2022-06-06 NOTE — TOC Progression Note (Addendum)
Transition of Care [facility]) - Progression Note    Patient Details  Name: [name] Dyer MRN: [id number] Date of Birth: 07/03/1932  Transition of Care [facility]) CM/SW Contact  Sharada Albornoz, Juliann Pulse, RN Phone Number: 06/06/2022, 11:42 AM  Clinical Narrative: Patient must be off haldol for 24hrs. Josem Kaufmann has a grace period until 06/06/22 @ 11p.[facility] updated-Will have a bed if stable.   -1p-[facility] unable to accept over weekend they will re assess patient on Monday for acceptance to [address]. Will need auth. PT to see over weekend.    Expected Discharge Plan: [place] Barriers to Discharge: Continued Medical Work up  Expected Discharge Plan and Services Expected Discharge Plan: [place] In-house Referral: Clinical Social Work   Post Acute Care Choice: [place] arrangements for the past 2 months: [place] Expected Discharge Date: 06/06/22                         HH Arranged: RN, PT, OT, Speech Therapy [place] Agency: [facility] Determinants of Health (SDOH) Interventions    Readmission Risk Interventions    05/31/2022    5:13 PM  Readmission Risk Prevention Plan  Transportation Screening Complete  Medication Review (RN Care Manager) Referral to Pharmacy  PCP or Specialist appointment within 3-5 days of discharge Complete  HRI or Home Care Consult Complete  SW Recovery Care/Counseling Consult Complete  Palliative Care Screening Not [place] Complete

## 2022-06-06 NOTE — Discharge Summary (Addendum)
Physician Discharge Summary   Patient: [name] Dyer MRN: [id number] DOB: 1932/04/30  Admit date:     05/29/2022  Discharge date: 06/09/22  Discharge Physician: Oswald Hillock   PCP: [name], NP   Recommendations at discharge:   Patient to be discharged to skilled nursing facility   Discharge Diagnoses: Principal Problem:   Acute metabolic encephalopathy Active Problems:   Permanent atrial fibrillation (HCC)   Hypertension   Hyperlipidemia   Acute cystitis   Rash   Severe sepsis ([place])   Chronic systolic CHF (congestive heart failure) ([place])  Resolved Problems:   * No resolved hospital problems. *  Hospital Course:  88 year old female with a history of moderate to severe dementia, chronic anemia, chronic right heart failure, hyperlipidemia, hypertension, permanent atrial fibrillation chronically on Xarelto, stage III CKD with baseline creatinine 1.5 to2, presented with complaints of 4 to 5 days a history of worsening confusion and somnolence. Seen by PCP and started on Macrobid for Enterococcus Faecium UTI, after 2 doses, the next day noted significant bilateral lower extremity rash and no significant improvement in mental status, presented to ED. mental status has somewhat improved with treatment of UTI, not back to baseline.  Assessment and Plan: Acute metabolic encephalopathy -Due to cystitis/dehydration/AKI complicating underlying advanced dementia -Delirium precautions -CT head was unremarkable -UDS negative -Despite treatment of UTI, mental status not back to baseline  -Started on Depakote  250 mg IV twice daily with no significant improvement  -She improved after she received 1 dose of Haldol yesterday -[name] mental status has stabilized with Depakote -Continue Depakote 250 mg p.o. twice daily   Enterococcus faecium/ESBL Klebsiella pneumoniae UTI -Urine culture from 5/26 showed Enterococcus sensitive to ampicillin, vancomycin and  nitrofurantoin -Patient developed lower extremity rash after she received 3 doses of nitrofurantoin -Nitrofurantoin was discontinued on admission but admission urine culture grew ESBL Klebsiella sensitive to gentamicin and imipenem -Patient started on IV meropenem while hospitalized -She has completed 7 days of treatment in the hospital   Bilateral legs rash -Started after patient received Macrobid 2 doses -Likely Macrobid allergy -Resolved   Acute kidney injury on CKD stage IIIb -Baseline creatinine 1.5-1.7 -Presented with creatinine 1.9 -Improved to 1.11 with IV hydration   Hypokalemia -Replete  Mild hyperkalemia -Resolved   Acute on chronic right heart failure -Patient will be seen on 06/06/2022 at -Lasix and metolazone were initially held  due to dehydration -Restarting Lasix 20 mg p.o. daily -Good urine output, not requiring oxygen     Hyperlipidemia -Continue statins   Permanent atrial fibrillation -Continue home dose of atenolol, Xarelto   Hypertension -Blood pressure controlled -Continue atenolol   Advanced dementia with behavior disturbance -Continue Aricept, Namenda -Follows [facility] as outpatient -Started on Depakote as above -Patient did receive Haldol 2 mg IV for agitation and has improved -Has been off Haldol for past 48 hours   Dysphagia -Speech therapy evaluation obtained -Diet changed to dysphagia 1 diet with thin liquids   Hemorrhoids -Patient had soft BM with some terminal blood from large hemorrhoids witnessed by RN on 06/01/2022 -Started on Anusol HC suppository, stool softeners   Adult failure to thrive: -Secondary to very advanced age, frail physical health, multiple comorbidities and advanced dementia. Palliative care consulted, recommend to continue medical management.  Full scope of care       Consultants: Palliative care Procedures performed:  Disposition: Skilled nursing facility Diet recommendation:  Discharge Diet  Orders (From admission, onward)     Start  Ordered   06/06/22 0000  Diet - low sodium heart healthy        06/06/22 1044           Dysphagia type 1 thin Liquid DISCHARGE MEDICATION: Allergies as of 06/09/2022       Reactions   Ciprofloxacin Diarrhea   Sulfa Antibiotics Hives, Rash   Severe Rash   Nitrofurantoin Rash   Developed severe bilateral leg rash.   Memantine Other (See Comments)   Dizziness. Pt takes this medication at home   Succinylsulphathiazole Rash   Sulfamethoxazole Rash        Medication List     STOP taking these medications    gabapentin 300 MG capsule Commonly known as: NEURONTIN   metolazone 2.5 MG tablet Commonly known as: ZAROXOLYN   potassium chloride SA 20 MEQ tablet Commonly known as: KLOR-CON M       TAKE these medications    acetaminophen 500 MG tablet Commonly known as: TYLENOL Take 1,000 mg by mouth every 6 (six) hours as needed for mild pain or headache.   alendronate 70 MG tablet Commonly known as: FOSAMAX Take 1 tablet (70 mg total) by mouth once a week.   allopurinol 100 MG tablet Commonly known as: ZYLOPRIM Take 1 tablet (100 mg total) by mouth daily.   atenolol 50 MG tablet Commonly known as: TENORMIN Take 1 tablet (50 mg total) by mouth daily.   calcium carbonate 1250 (500 Ca) MG tablet Commonly known as: OS-CAL - dosed in mg of elemental calcium Take 1 tablet (1,250 mg total) by mouth in the morning.   cholecalciferol 25 MCG (1000 UNIT) tablet Commonly known as: VITAMIN D3 Take 2,000 Units by mouth daily.   colchicine 0.6 MG tablet Take 2 tablets by mouth x 1 dose then one tablet daily x 3 days wait 12 hours prior to resuming Allopurinol What changed:  how much to take how to take this when to take this reasons to take this additional instructions   divalproex 125 MG capsule Commonly known as: DEPAKOTE SPRINKLE Take 2 capsules (250 mg total) by mouth 2 (two) times daily.   docusate sodium 100 MG  capsule Commonly known as: COLACE Take 1 capsule (100 mg total) by mouth 2 (two) times daily.   donepezil 10 MG tablet Commonly known as: ARICEPT Take 1 tablet (10 mg total) by mouth at bedtime.   furosemide 20 MG tablet Commonly known as: LASIX Take 1 tablet (20 mg total) by mouth daily. Start taking on: June 10, 2022 What changed:  medication strength how much to take how to take this when to take this additional instructions   hydrocortisone 25 MG suppository Commonly known as: ANUSOL-HC Place 1 suppository (25 mg total) rectally 2 (two) times daily as needed for hemorrhoids or anal itching.   ICY HOT EX Apply 1 application. topically at bedtime.   ipratropium 0.06 % nasal spray Commonly known as: ATROVENT CAN USE 2 SPRAYS IN EACH NOSTRIL EVERY 6 HOURS IF NEEDED TO DRY UP RUNNY N0SE   memantine 5 MG tablet Commonly known as: NAMENDA Take 1 tablet (5 mg total) by mouth 2 (two) times daily.   MULTIPLE VITAMINS/WOMENS PO Take 1 tablet by mouth daily.   omeprazole 20 MG capsule Commonly known as: PRILOSEC Take 1 capsule (20 mg total) by mouth daily.   rosuvastatin 5 MG tablet Commonly known as: CRESTOR Take 1 tablet (5 mg total) by mouth daily.   saccharomyces boulardii 250 MG capsule Commonly known  as: FLORASTOR Take 1 capsule (250 mg total) by mouth 2 (two) times daily. For 10 days.   traZODone 50 MG tablet Commonly known as: DESYREL Take 50 mg by mouth at bedtime as needed for sleep.   vitamin B-12 1000 MCG tablet Commonly known as: CYANOCOBALAMIN Take 1,000 mcg by mouth daily.   vitamin C 250 MG tablet Commonly known as: ASCORBIC ACID Take 250 mg by mouth daily.   Xarelto 15 MG Tabs tablet Generic drug: Rivaroxaban TAKE 1 TABLET (15 MG TOTAL) BY MOUTH DAILY WITH SUPPER.         Contact information for after-discharge care     Destination     [facility] .   Service: Skilled Nursing Contact information: [place] [phone]                    Discharge Exam: Danley Danker Weights   05/30/22 1238  Weight: 58.3 kg   General-appears in no acute distress Heart-S1-S2, regular, no murmur auscultated Lungs-clear to auscultation bilaterally, no wheezing or crackles auscultated Abdomen-soft, nontender, no organomegaly Extremities-no edema in the lower extremities Neuro-alert, not oriented x3  Condition at discharge: fair  The results of significant diagnostics from this hospitalization (including imaging, microbiology, ancillary and laboratory) are listed below for reference.   Imaging Studies: DG Swallowing Func-Speech Pathology  Result Date: 06/02/2022 Table formatting from the original result was not included. Objective Swallowing Evaluation: Type of Study: MBS-Modified Barium Swallow Study  Patient Details Name: [name] Dyer MRN: [id number] Date of Birth: May 26, 1932 Today's Date: 06/02/2022 Time: SLP Start Time (ACUTE ONLY): 1305 -SLP Stop Time (ACUTE ONLY): 5361 SLP Time Calculation (min) (ACUTE ONLY): 20 min Past Medical History: Past Medical History: Diagnosis Date  Alzheimer disease ([place])   Anemia   Arrhythmia   Cataract   Colon polyps   Community acquired pneumonia 01/2012  LLL  Congestive heart failure ([place])   Hyperlipidemia   Hypertension   Memory difficulty 10/01/2016  Osteoporosis   Permanent atrial fibrillation (HCC)   Pleural effusion, right 01/2012  SVT (supraventricular tachycardia) ([place]) 01/2012  UTI (lower urinary tract infection)  Past Surgical History: Past Surgical History: Procedure Laterality Date  [place]  COLONOSCOPY    COLONOSCOPY N/A 06/10/2016  Procedure: COLONOSCOPY;  Surgeon: Manus Gunning, MD;  Location: [facility];  Service: Gastroenterology;  Laterality: N/A;  ESOPHAGOGASTRODUODENOSCOPY N/A 06/10/2016  Procedure: ESOPHAGOGASTRODUODENOSCOPY (EGD);  Surgeon: Manus Gunning, MD;   Location: [facility];  Service: Gastroenterology;  Laterality: N/A;  HEMIARTHROPLASTY HIP  2011  Left femoral neck fracture  RIGHT/LEFT HEART CATH AND CORONARY ANGIOGRAPHY N/A 01/08/2022  Procedure: RIGHT/LEFT HEART CATH AND CORONARY ANGIOGRAPHY;  Surgeon: Leonie Man, MD;  Location: [facility];  Service: Cardiovascular;  Laterality: N/A;  UPPER GASTROINTESTINAL ENDOSCOPY   HPI: 88 year old female, lives with her sons admitted worsening confusion and somnolence. Found to have metabolic encephalopathy and suspected acute cystitis. PMH:  moderate to severe dementia, chronic anemia, chronic right heart failure, hyperlipidemia, hypertension, permanent atrial fibrillation chronically, stage IV CKD.  Pt had MBS in 1/23 "[name] oropharyngeal swallowing is functional with adequate efficiency and no aspiration and occasional throat clearing/cough with clear airway.  BSE 04/27/22 + coughing (dry) with possibility of esophageal dysphagia recommended MBS however not charted after 04/27/22( possibly discharged prior to scheduling).  Subjective: alert, cooperative, difficulty following commands  Recommendations for follow up therapy are one component of a  multi-disciplinary discharge planning process, led by the attending physician.  Recommendations may be updated based on patient status, additional functional criteria and insurance authorization. Assessment / Plan / Recommendation   06/02/2022   2:04 PM Clinical Impressions Clinical Impression Patient presenting with a moderately impaired oral phase of swallow and mildly impaired pharyngeal phase of swallow. During oral phase, patient exhibited inefficient and prolonged mastication, inability to move 1/2 of a 23m barium tablet from anterior portion of mouth (SLP had to scoop it out both attempts) and PO residuals in oral cavity post initial swallow with regular solids. During pharyngeal phase of swallow, patient exhibited swallow initiation delay to pyriform sinus with  thin liquids, vallecular sinus with regular solids. Trace amount of pharyngeal residuals observed in vallecular sinus and posterior pharyngeal wall with puree solids but no significant amount of residuals observed with puree solids or thin liquids. No instances of penetration or aspiration observed with any tested consistency. SLP is recommending downgrade to Dys 2 (fine chopped) solids, continue with thin liquids. SLP Visit Diagnosis Dysphagia, oropharyngeal phase (R13.12) Impact on safety and function Mild aspiration risk     06/02/2022   2:04 PM Treatment Recommendations Treatment Recommendations Therapy as outlined in treatment plan below     06/02/2022   2:09 PM Prognosis Prognosis for Safe Diet Advancement Fair Barriers to Reach Goals Cognitive deficits;Time post onset   06/02/2022   2:04 PM Diet Recommendations SLP Diet Recommendations Dysphagia 2 (Fine chop) solids;Thin liquid Liquid Administration via Cup;Straw Medication Administration Crushed with puree Compensations Minimize environmental distractions;Slow rate;Small sips/bites Postural Changes Seated upright at 90 degrees     06/02/2022   2:04 PM Other Recommendations Oral Care Recommendations Oral care BID;Staff/trained caregiver to provide oral care Follow Up Recommendations Skilled nursing-short term rehab (<3 hours/day) Assistance recommended at discharge Frequent or constant Supervision/Assistance Functional Status Assessment Patient has had a recent decline in their functional status and demonstrates the ability to make significant improvements in function in a reasonable and predictable amount of time.   06/02/2022   2:04 PM Frequency and Duration  Speech Therapy Frequency (ACUTE ONLY) min 2x/week Treatment Duration 2 weeks     06/02/2022   1:59 PM Oral Phase Oral Phase Impaired Oral - Thin Straw Weak lingual manipulation;Delayed oral transit;Premature spillage Oral - Puree Weak lingual manipulation;Reduced posterior propulsion Oral - Regular Weak lingual  manipulation;Reduced posterior propulsion;Impaired mastication Oral - Pill Weak lingual manipulation;Decreased bolus cohesion;Reduced posterior propulsion    06/02/2022   2:01 PM Pharyngeal Phase Pharyngeal Phase Impaired Pharyngeal- Thin Straw Delayed swallow initiation-pyriform sinuses Pharyngeal- Puree Pharyngeal residue - valleculae Pharyngeal- Regular Delayed swallow initiation-vallecula Pharyngeal- Pill NT    06/02/2022   2:04 PM Cervical Esophageal Phase  Cervical Esophageal Phase [facility]Bay[facility] Alliant HospitalJSonia Baller MA, CCC-SLP Speech Therapy                     CT HEAD WO CONTRAST (5MM)  Result Date: 05/29/2022 CLINICAL DATA:  Delirium.  Altered level of consciousness. EXAM: CT HEAD WITHOUT CONTRAST TECHNIQUE: Contiguous axial images were obtained from the base of the skull through the vertex without intravenous contrast. RADIATION DOSE REDUCTION: This exam was performed according to the departmental dose-optimization program which includes automated exposure control, adjustment of the mA and/or kV according to patient size and/or use of iterative reconstruction technique. COMPARISON:  Head CT and brain MRI 05/18/2022 FINDINGS: Brain: No acute intracranial hemorrhage. Stable atrophy. Advanced periventricular and deep chronic small vessel ischemia. 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 No evidence of acute ischemia, although degree of background chronic change limits detailed assessment. No subdural or extra-axial collection. Vascular: Atherosclerosis of skullbase vasculature without hyperdense vessel or abnormal calcification. Skull: No fracture or focal lesion. Sinuses/Orbits: No acute findings.  Bilateral cataract resection. Other: None. IMPRESSION: 1. No acute intracranial abnormality. 2. Stable atrophy and advanced chronic small vessel ischemia. Multiple remote cerebral and cerebellar infarcts are unchanged from prior exam. Electronically Signed   By: Keith Rake  M.D.   On: 05/29/2022 18:00   DG Chest Port 1 View  Result Date: 05/29/2022 CLINICAL DATA:  Possible sepsis EXAM: PORTABLE CHEST 1 VIEW COMPARISON:  Prior chest x-ray 05/18/2022 FINDINGS: Cardiomegaly. No edema, focal airspace infiltrate, pleural effusion or pneumothorax. No acute osseous abnormality. IMPRESSION: Stable chest x-ray and cardiomegaly without evidence of pneumonia. Electronically Signed   By: Jacqulynn Cadet M.D.   On: 05/29/2022 15:48   MR BRAIN WO CONTRAST  Result Date: 05/18/2022 CLINICAL DATA:  Transient ischemic attack (TIA) Neuro deficit, acute, stroke suspected EXAM: MRI HEAD WITHOUT CONTRAST TECHNIQUE: Multiplanar, multiecho pulse sequences of the brain and surrounding structures were obtained without intravenous contrast. COMPARISON:  04/22/2022 FINDINGS: Brain: There is no acute infarction. Prominence of the ventricles and sulci reflects stable parenchymal volume loss. Confluent areas of T2 hyperintensity in the supratentorial white matter are nonspecific but may reflect advanced chronic microvascular ischemic changes. Chronic bilateral frontoparietal, occipital, and cerebellar infarcts are again noted. Minimal punctate foci of susceptibility are again identified and likely reflect chronic microhemorrhages. There is no intracranial mass, mass effect, or edema. There is no hydrocephalus or extra-axial fluid collection. Vascular: Major vessel flow voids at the skull base are preserved. Skull and upper cervical spine: Normal marrow signal is preserved. Sinuses/Orbits: Paranasal sinuses are aerated. Bilateral lens replacements. Other: Sella is unremarkable.  Mastoid air cells are clear. IMPRESSION: No acute infarction or significant change since prior study. Advanced chronic microvascular ischemic changes with superimposed chronic infarcts. Electronically Signed   By: Macy Mis M.D.   On: 05/18/2022 17:45   DG Chest Port 1 View  Result Date: 05/18/2022 CLINICAL DATA:  Syncopal  episode today.  Altered mental status. EXAM: PORTABLE CHEST 1 VIEW COMPARISON:  04/25/2022 FINDINGS: Stable mild cardiomegaly. Both lungs are clear. The visualized skeletal structures are unremarkable. IMPRESSION: Stable mild cardiomegaly. No active lung disease. Electronically Signed   By: Marlaine Hind M.D.   On: 05/18/2022 16:06   CT HEAD WO CONTRAST (5MM)  Result Date: 05/18/2022 CLINICAL DATA:  Mental status change. EXAM: CT HEAD WITHOUT CONTRAST TECHNIQUE: Contiguous axial images were obtained from the base of the skull through the vertex without intravenous contrast. RADIATION DOSE REDUCTION: This exam was performed according to the departmental dose-optimization program which includes automated exposure control, adjustment of the mA and/or kV according to patient size and/or use of iterative reconstruction technique. COMPARISON:  MRI head 04/22/2022 and 04/21/2022. FINDINGS: Brain: There is no evidence for acute intracranial hemorrhage or extra-axial fluid collection. There is no mass effect or midline shift. Old infarcts are seen in the left parietal region and right occipital region similar to prior MRI. There is mild diffuse atrophy with compensatory dilatation of the ventricular system, unchanged. There is also small infarct in the left cerebellum, unchanged. Vascular: Atherosclerotic calcifications are present within the cavernous internal carotid arteries. Skull: Normal. Negative for fracture or focal lesion. Sinuses/Orbits: No acute finding. Other: None. IMPRESSION: 1. No acute intracranial process. 2. Unchanged old infarcts  in the left parietal lobe, right occipital lobe, and left cerebellum. 3. Extensive periventricular white matter hypodensities likely chronic small vessel ischemic change. If there is high clinical concern for acute infarct, consider further evaluation with MRI. No acute intracranial abnormality. Electronically Signed   By: Ronney Asters M.D.   On: 05/18/2022 15:52     Microbiology: Results for orders placed or performed during the hospital encounter of 05/29/22  Blood Culture (routine x 2)     Status: None   Collection Time: 05/29/22  3:36 PM   Specimen: BLOOD  Result Value Ref Range Status   Specimen Description   Final    BLOOD BLOOD LEFT ARM Performed at [facility] [address] [place], [place] [address]    Special Requests   Final    BOTTLES DRAWN AEROBIC AND ANAEROBIC Blood Culture results may not be optimal due to an inadequate volume of blood received in culture bottles Performed at [facility] [address], [place], [place] [address]    Culture   Final    NO GROWTH 5 DAYS Performed at [facility], [place] [address] [place], [place] [address]    Report Status 06/03/2022 FINAL  Final  Blood Culture (routine x 2)     Status: None   Collection Time: 05/29/22  5:03 PM   Specimen: BLOOD  Result Value Ref Range Status   Specimen Description   Final    BLOOD BLOOD RIGHT FOREARM Performed at [facility] [address], [place], [place] [address]    Special Requests   Final    BOTTLES DRAWN AEROBIC AND ANAEROBIC Blood Culture results may not be optimal due to an inadequate volume of blood received in culture bottles Performed at [facility] [address] [place], [place] [address]    Culture   Final    NO GROWTH 5 DAYS Performed at [facility], [place] [address], [place], [place] [address]    Report Status 06/03/2022 FINAL  Final  Urine Culture     Status: Abnormal   Collection Time: 05/29/22  6:29 PM   Specimen: In/Out Cath Urine  Result Value Ref Range Status   Specimen Description   Final    IN/OUT CATH URINE Performed at [facility] [address] [place], [place] [address]    Special Requests   Final    NONE Performed at [facility], [place] [address] [place], [place] [address]     Culture (A)  Final    >=100,000 COLONIES/mL KLEBSIELLA PNEUMONIAE Confirmed Extended Spectrum Beta-Lactamase Producer (ESBL).  In bloodstream infections from ESBL organisms, carbapenems are preferred over piperacillin/tazobactam. They are shown to have a lower risk of mortality.    Report Status 05/31/2022 FINAL  Final   Organism ID, Bacteria KLEBSIELLA PNEUMONIAE (A)  Final      Susceptibility   Klebsiella pneumoniae - MIC*    AMPICILLIN >=32 RESISTANT Resistant     CEFAZOLIN >=64 RESISTANT Resistant     CEFEPIME >=32 RESISTANT Resistant     CEFTRIAXONE >=64 RESISTANT Resistant     CIPROFLOXACIN >=4 RESISTANT Resistant     GENTAMICIN <=1 SENSITIVE Sensitive     IMIPENEM <=0.25 SENSITIVE Sensitive     NITROFURANTOIN 64 INTERMEDIATE Intermediate     TRIMETH/SULFA >=320 RESISTANT Resistant     AMPICILLIN/SULBACTAM >=32 RESISTANT Resistant     PIP/TAZO 32 INTERMEDIATE Intermediate     * >=100,000 COLONIES/mL  KLEBSIELLA PNEUMONIAE    Labs: CBC: Recent Labs  Lab 06/03/22 0817 06/05/22 1708  WBC 8.0 12.5*  NEUTROABS 5.8  --   HGB 11.4* 12.4  HCT 36.1 41.4  MCV 90.9 95.6  PLT PLATELET CLUMPS NOTED ON SMEAR, COUNT APPEARS DECREASED 710*   Basic Metabolic Panel: Recent Labs  Lab 06/05/22 1708 06/06/22 0732 06/07/22 0901 06/08/22 0542 06/09/22 0437  NA 145  --  146* 146* 148*  K 5.8* 5.2* 4.9 4.5 4.1  CL 115*  --  117* 115* 116*  CO2 21*  --  _0 GLUCOSE 127*  --  133* 97 100*  BUN 41*  --  44* 46* 46*  CREATININE 1.39*  --  1.17* 1.04* 1.05*  CALCIUM 9.1  --  8.5* 8.5* 8.6*   Liver Function Tests: Recent Labs  Lab 06/05/22 1708  AST 27  ALT 15  ALKPHOS 98  BILITOT 1.3*  PROT 7.0  ALBUMIN 3.1*   CBG: No results for input(s): "GLUCAP" in the last 168 hours.  Discharge time spent: greater than 30 minutes.  Signed: Oswald Hillock, MD [facility] 06/09/2022

## 2022-06-07 LAB — BASIC METABOLIC PANEL WITH GFR
Anion gap: 5 (ref 5–15)
BUN: 44 mg/dL — ABNORMAL HIGH (ref 8–23)
CO2: 24 mmol/L (ref 22–32)
Calcium: 8.5 mg/dL — ABNORMAL LOW (ref 8.9–10.3)
Chloride: 117 mmol/L — ABNORMAL HIGH (ref 98–111)
Creatinine, Ser: 1.17 mg/dL — ABNORMAL HIGH (ref 0.44–1.00)
GFR, Estimated: 45 mL/min — ABNORMAL LOW
Glucose, Bld: 133 mg/dL — ABNORMAL HIGH (ref 70–99)
Potassium: 4.9 mmol/L (ref 3.5–5.1)
Sodium: 146 mmol/L — ABNORMAL HIGH (ref 135–145)

## 2022-06-07 MED ORDER — FUROSEMIDE 20 MG PO TABS
20.0000 mg | ORAL_TABLET | Freq: Two times a day (BID) | ORAL | Status: DC
  Administered 2022-06-07 – 2022-06-08 (×3): 20 mg via ORAL
  Filled 2022-06-07 (×3): qty 1

## 2022-06-07 NOTE — Progress Notes (Signed)
Physical Therapy Treatment Patient Details Name: [name] Dyer MRN: [id number] DOB: 07-12-1932 Today's Date: 06/07/2022   History of Present Illness [name] Dyer is a 88 y.o. female presents with AMS and admitted with acute metabolic encephalopathy. PMH: advanced dementia, chronic anemia, CHF, hyperlipidemia, HTN, permanent afib, stage IV CKD, osteoporosis    PT Comments    Pt assisted with sitting EOB however requiring total assist and unable to maintain sitting balance without external support.  Pt assisted more with sit to stands and performed x3.  Pt continues to require increased assist and multimodal cues.  Pt not resistant to mobility today and returned to supine for pericare and linen change with RN assisting.  Pt repositioned with pillows and heels floating end of session.    Recommendations for follow up therapy are one component of a multi-disciplinary discharge planning process, led by the attending physician.  Recommendations may be updated based on patient status, additional functional criteria and insurance authorization.  Follow Up Recommendations  Skilled nursing-short term rehab (<3 hours/day)     Assistance Recommended at Discharge Frequent or constant Supervision/Assistance  Patient can return home with the following Two people to help with walking and/or transfers;Assistance with feeding;Assistance with cooking/housework;Assist for transportation;Direct supervision/assist for medications management;A lot of help with bathing/dressing/bathroom   Equipment Recommendations  None recommended by PT    Recommendations for Other Services       Precautions / Restrictions Precautions Precautions: Fall Precaution Comments: advanced dementia     Mobility  Bed Mobility Overal bed mobility: Needs Assistance Bed Mobility: Rolling, Supine to Sit, Sit to Supine Rolling: Total assist, +2 for physical assistance   Supine to sit: Total assist Sit to supine: Total  assist   General bed mobility comments: pt provided with multimodal cues and time however requiring significant physical assist; pt not resisting yet not helping with transfers either; upon return to bed performed rolling for pericare and linen change with RN    Transfers Overall transfer level: Needs assistance Equipment used: 2 person hand held assist Transfers: Sit to/from Stand Sit to Stand: Mod assist, +2 physical assistance           General transfer comment: pt provided with bil hand support and multimodal cues for technique; pt did assisting with standing however requiring physical assist for weakness and stability; therapist blocking knee in case of buckling; performed sit to stand x3    Ambulation/Gait                   Stairs             Wheelchair Mobility    Modified Rankin (Stroke Patients Only)       Balance Overall balance assessment: Needs assistance, History of Falls Sitting-balance support: Feet supported, Bilateral upper extremity supported Sitting balance-Leahy Scale: Poor     Standing balance support: Bilateral upper extremity supported, Reliant on assistive device for balance Standing balance-Leahy Scale: Zero                              Cognition Arousal/Alertness: Awake/alert Behavior During Therapy: Flat affect Overall Cognitive Status: Impaired/Different from baseline                                 General Comments: hx of advanced dementia; pt with little verbalization, provided with multimodal cues and pt inconsistent with assisting  Exercises      General Comments        Pertinent Vitals/Pain Pain Assessment Pain Assessment: PAINAD Faces Pain Scale: No hurt Breathing: normal Negative Vocalization: none Facial Expression: smiling or inexpressive Body Language: tense, distressed pacing, fidgeting Consolability: no need to console PAINAD Score: 1 Pain Intervention(s):  Repositioned, Monitored during session    Home Living                          Prior Function            PT Goals (current goals can now be found in the care plan section) Progress towards PT goals: Progressing toward goals    Frequency    Min 2X/week      PT Plan Current plan remains appropriate    Co-evaluation              AM-PAC PT "6 Clicks" Mobility   Outcome Measure  Help needed turning from your back to your side while in a flat bed without using bedrails?: A Lot Help needed moving from lying on your back to sitting on the side of a flat bed without using bedrails?: Total Help needed moving to and from a bed to a chair (including a wheelchair)?: Total Help needed standing up from a chair using your arms (e.g., wheelchair or bedside chair)?: Total Help needed to walk in hospital room?: Total Help needed climbing 3-5 steps with a railing? : Total 6 Click Score: 7    End of Session Equipment Utilized During Treatment: Gait belt Activity Tolerance: Patient tolerated treatment well Patient left: in bed;with call bell/phone within reach;with nursing/sitter in room Nurse Communication: Mobility status PT Visit Diagnosis: Other abnormalities of gait and mobility (R26.89);Muscle weakness (generalized) (M62.81);History of falling (Z91.81)     Time: 9447-3958 PT Time Calculation (min) (ACUTE ONLY): 26 min  Charges:  $Therapeutic Activity: 23-37 mins                    Jannette Spanner PT, DPT Acute Rehabilitation Services Pager: [phone] Office: [place] 06/07/2022, 4:21 PM

## 2022-06-07 NOTE — Progress Notes (Signed)
[facility]  PROGRESS NOTE  [name] Dyer [id number]:[id number] DOB: 11-Aug-1932 DOA: 05/29/2022 PCP: [name], NP   Brief HPI:   88 year old female with a history of moderate to severe dementia, chronic anemia, chronic right heart failure, hyperlipidemia, hypertension, permanent atrial fibrillation chronically on Xarelto, stage III CKD with baseline creatinine 1.5 to2, presented with complaints of 4 to 5 days a history of worsening confusion and somnolence. Seen by PCP and started on Macrobid for Enterococcus Faecium UTI, after 2 doses, the next day noted significant bilateral lower extremity rash and no significant improvement in mental status, presented to ED. mental status has somewhat improved with treatment of UTI, not back to baseline.   Subjective   Patient mental status has significantly improved.  Being fed breakfast in bed by the nurse tech.  She appears comfortable.   Patient was discharged yesterday however discharge was held as patient had received Haldol within 24 hours.   Assessment/Plan:     Acute metabolic encephalopathy -Due to cystitis/dehydration/AKI complicating underlying advanced dementia -Delirium precautions -CT head was unremarkable -UDS negative -Despite treatment of UTI, mental status not back to baseline  -Started on Depakote 250 mg IV twice daily with no significant improvement  -She improved after she received 1 dose of Haldol yesterday -Mental status significantly improved with Depakote -She is currently not requiring Haldol   Enterococcus faecium/ESBL Klebsiella pneumoniae UTI -Urine culture from 5/26 showed Enterococcus sensitive to ampicillin, vancomycin and nitrofurantoin -Patient developed lower extremity rash after she received 3 doses of nitrofurantoin -Nitrofurantoin was discontinued on admission but admission urine culture grew ESBL Klebsiella sensitive to gentamicin and imipenem -Patient started on IV meropenem while  hospitalized -She has completed 7 days of treatment in the hospital  Bilateral legs rash -Started after patient received Macrobid 2 doses -Likely Macrobid allergy -Resolved  Acute kidney injury on CKD stage IIIb -Baseline creatinine 1.5-1.7 -Presented with creatinine 1.9 -Improved to 1.11 with IV hydration  Hypokalemia -Replete  Acute on chronic right heart failure -Lasix and metolazone were held -Has developed mild fluid overload with IV fluids -We will discontinue IV fluids -Start Lasix 20 mg p.o. twice daily  Hyperlipidemia -Continue statins  Permanent atrial fibrillation -Continue home dose of atenolol, Xarelto  Hypertension -Blood pressure controlled -Continue atenolol  Advanced dementia with behavior disturbance -Continue Aricept, Namenda -Follows [facility] as outpatient -Started on Depakote as above -Continue Haldol 2 mg IV every 6 hours as needed for agitation -Psychiatry consulted for  Dysphagia -Speech therapy evaluation obtained -Diet changed to dysphagia 1 diet with thin liquids  Hemorrhoids -Patient had soft BM with some terminal blood from large hemorrhoids witnessed by RN on 06/01/2022 -Started on Anusol HC suppository, stool softeners  Adult failure to thrive: -Secondary to very advanced age, frail physical health, multiple comorbidities and advanced dementia. Palliative care consulted  Discharge planning -Patient was discharged on 06/06/2022 however discharge was canceled after [facility] refused to take patient as she received Haldol in past 24 hours -We will have to start new authorization on Monday -We will obtain PT evaluation on Sunday   Medications     allopurinol  100 mg Oral Daily   atenolol  50 mg Oral Daily   calcium carbonate  2 tablet Oral QAC breakfast   cholecalciferol  2,000 Units Oral Daily   divalproex  250 mg Oral BID   docusate sodium  100 mg Oral BID   donepezil  10 mg Oral QHS   enoxaparin (LOVENOX)  injection  60 mg Subcutaneous Q24H   furosemide  20 mg Oral BID   hydrocortisone  25 mg Rectal BID   memantine  5 mg Oral BID   pantoprazole  40 mg Oral Daily   rosuvastatin  5 mg Oral Daily   saccharomyces boulardii  250 mg Oral BID     Data Reviewed:   CBG:  No results for input(s): "GLUCAP" in the last 168 hours.  SpO2: 100 % O2 Flow Rate (L/min): 2 L/min    Vitals:   06/05/22 1900 06/06/22 1418 06/07/22 0049 06/07/22 0543  BP: (!) 121/92 (!) 135/95 (!) 151/92 (!) 152/86  Pulse: (!) 117 (!) 109 98 75  Resp: _0 Temp:  98.8 F (37.1 C) 98.4 F (36.9 C) 98.5 F (36.9 C)  TempSrc: Oral Oral Oral Oral  SpO2: 100% 99% 100% 100%  Weight:      Height:          Data Reviewed:  Basic Metabolic Panel: Recent Labs  Lab 06/01/22 0522 06/02/22 0429 06/05/22 1708 06/06/22 0732 06/07/22 0901  NA 138 140 145  --  146*  K 3.1* 4.3 5.8* 5.2* 4.9  CL 100 106 115*  --  117*  CO2 29 26 21*  --  24  GLUCOSE 101* 104* 127*  --  133*  BUN 44* 33* 41*  --  44*  CREATININE 1.29* 1.11* 1.39*  --  1.17*  CALCIUM 8.8* 8.9 9.1  --  8.5*  MG  --  2.4  --   --   --     CBC: Recent Labs  Lab 06/02/22 0429 06/03/22 0817 06/05/22 1708  WBC 6.6 8.0 12.5*  NEUTROABS  --  5.8  --   HGB 11.0* 11.4* 12.4  HCT 35.5* 36.1 41.4  MCV 91.7 90.9 95.6  PLT PLT CLUMPING NOTED, COLLECT IN CITRATE FOR CBC PLATELET CLUMPS NOTED ON SMEAR, COUNT APPEARS DECREASED 142*    LFT Recent Labs  Lab 06/02/22 0429 06/05/22 1708  AST 24 27  ALT 17 15  ALKPHOS 93 98  BILITOT 1.5* 1.3*  PROT 6.5 7.0  ALBUMIN 2.9* 3.1*     Antibiotics: Anti-infectives (From admission, onward)    Start     Dose/Rate Route Frequency Ordered Stop   05/31/22 1700  meropenem (MERREM) 1 g in sodium chloride 0.9 % 100 mL IVPB        1 g 200 mL/hr over 30 Minutes Intravenous Every 12 hours 05/31/22 1605 06/06/22 2359   05/29/22 2100  ampicillin (OMNIPEN) 1 g in sodium chloride 0.9 % 100 mL IVPB  Status:   Discontinued        1 g 300 mL/hr over 20 Minutes Intravenous 2 times daily 05/29/22 2016 05/29/22 2020   05/29/22 2100  ampicillin (OMNIPEN) 2 g in sodium chloride 0.9 % 100 mL IVPB  Status:  Discontinued        2 g 300 mL/hr over 20 Minutes Intravenous 2 times daily 05/29/22 2020 05/31/22 1605        DVT prophylaxis: SCDs, Xarelto he  Code Status: Full code  Family Communication: Spoke to patient's son and daughter-in-law at bedside, explained to them that patient is not a good candidate for artificial nutrition with PEG tube.  If she does not improve in next few days then family is open to consider comfort measures and this advanced dementia patient with behavior disturbance.   CONSULTS    Objective    Physical Examination:  General-appears  in no acute distress Heart-S1-S2, regular, no murmur auscultated Lungs-bilateral wheezing auscultated Abdomen-soft, nontender, no organomegaly Extremities-no edema in the lower extremities Neuro-alert, not oriented x3, no focal deficit noted   Status is: Inpatient:          Oswald Hillock   [facility] If 7PM-7AM, please contact night-coverage at [email or web address], Office  [phone]   06/07/2022, 3:12 PM  LOS: 9 days

## 2022-06-08 LAB — BASIC METABOLIC PANEL
Anion gap: 9 (ref 5–15)
BUN: 46 mg/dL — ABNORMAL HIGH (ref 8–23)
CO2: 22 mmol/L (ref 22–32)
Calcium: 8.5 mg/dL — ABNORMAL LOW (ref 8.9–10.3)
Chloride: 115 mmol/L — ABNORMAL HIGH (ref 98–111)
Creatinine, Ser: 1.04 mg/dL — ABNORMAL HIGH (ref 0.44–1.00)
GFR, Estimated: 52 mL/min — ABNORMAL LOW (ref >60)
Glucose, Bld: 97 mg/dL (ref 70–99)
Potassium: 4.5 mmol/L (ref 3.5–5.1)
Sodium: 146 mmol/L — ABNORMAL HIGH (ref 135–145)

## 2022-06-08 MED ORDER — FUROSEMIDE 20 MG PO TABS
20.0000 mg | ORAL_TABLET | Freq: Every day | ORAL | Status: DC
  Administered 2022-06-09: 20 mg via ORAL
  Filled 2022-06-08: qty 1

## 2022-06-08 MED ORDER — RIVAROXABAN 15 MG PO TABS
15.0000 mg | ORAL_TABLET | Freq: Every day | ORAL | Status: DC
  Administered 2022-06-09: 15 mg via ORAL
  Filled 2022-06-08: qty 1

## 2022-06-08 NOTE — Progress Notes (Addendum)
[facility]  PROGRESS NOTE  [name] Dyer [id number] DOB: January 25, 1932 DOA: 05/29/2022 PCP: [name], NP   Brief HPI:    88 year old female with a history of moderate to severe dementia, chronic anemia, chronic right heart failure, hyperlipidemia, hypertension, permanent atrial fibrillation chronically on Xarelto, stage III CKD with baseline creatinine 1.5 to2, presented with complaints of 4 to 5 days a history of worsening confusion and somnolence. Seen by PCP and started on Macrobid for Enterococcus Faecium UTI, after 2 doses, the next day noted significant bilateral lower extremity rash and no significant improvement in mental status, presented to ED. mental status has somewhat improved with treatment of UTI, not back to baseline.   Subjective   Patient seen and examined, no more agitation.  Resting comfortably in bed.  Breathing has improved since starting Lasix.   Assessment/Plan:     Acute metabolic encephalopathy -Improved -Due to cystitis/dehydration/AKI complicating underlying advanced dementia -Delirium precautions -CT head was unremarkable -UDS negative -Despite treatment of UTI, mental status not back to baseline  -Started on Depakote 250 mg IV twice daily with no significant improvement  -She improved after she received 1 dose of Haldol yesterday -Mental status significantly improved with Depakote -She is currently not requiring Haldol   Enterococcus faecium/ESBL Klebsiella pneumoniae UTI -Urine culture from 5/26 showed Enterococcus sensitive to ampicillin, vancomycin and nitrofurantoin -Patient developed lower extremity rash after she received 3 doses of nitrofurantoin -Nitrofurantoin was discontinued on admission but admission urine culture grew ESBL Klebsiella sensitive to gentamicin and imipenem -Patient started on IV meropenem while hospitalized -She has completed 7 days of treatment in the hospital  Bilateral legs rash -Started after  patient received Macrobid 2 doses -Likely Macrobid allergy -Resolved  Acute kidney injury on CKD stage IIIb -Baseline creatinine 1.5-1.7 -Presented with creatinine 1.9 -Improved to 1.11 with IV hydration  Hyperkalemia -Resolved  Acute on chronic right heart failure -Lasix and metolazone were held -Has developed mild fluid overload with IV fluids -IV fluids were discontinued -Patient started on Lasix 20 mg p.o. twice daily. -Net +3 L -No longer requiring oxygen -Mild elevation of BUN after starting diuretics.  Change Lasix to 20 mg p.o. daily  Hyperlipidemia -Continue statins  Permanent atrial fibrillation -Continue home dose of atenolol, Xarelto  Hypertension -Blood pressure controlled -Continue atenolol  Advanced dementia with behavior disturbance -Continue Aricept, Namenda -Follows [facility] as outpatient -Started on Depakote as above -Continue Haldol 2 mg IV every 6 hours as needed for agitation -Psychiatry consulted for  Dysphagia -Speech therapy evaluation obtained -Diet changed to dysphagia 1 diet with thin liquids  Hemorrhoids -Patient had soft BM with some terminal blood from large hemorrhoids witnessed by RN on 06/01/2022 -Started on Anusol HC suppository, stool softeners  Adult failure to thrive: -Secondary to very advanced age, frail physical health, multiple comorbidities and advanced dementia. Palliative care consulted  Discharge planning -Patient was discharged on 06/06/2022 however discharge was canceled after [facility] refused to take patient as she received Haldol in past 24 hours -We will have to start new authorization on Monday -PT obtained today, recommend skilled nursing facility for rehab   Medications     allopurinol  100 mg Oral Daily   atenolol  50 mg Oral Daily   calcium carbonate  2 tablet Oral QAC breakfast   cholecalciferol  2,000 Units Oral Daily   divalproex  250 mg Oral BID   docusate sodium  100 mg Oral BID    donepezil  10 mg  Oral QHS   furosemide  20 mg Oral BID   hydrocortisone  25 mg Rectal BID   memantine  5 mg Oral BID   pantoprazole  40 mg Oral Daily   [START ON 06/09/2022] rivaroxaban  15 mg Oral Q supper   rosuvastatin  5 mg Oral Daily   saccharomyces boulardii  250 mg Oral BID     Data Reviewed:   CBG:  No results for input(s): "GLUCAP" in the last 168 hours.  SpO2: 94 % O2 Flow Rate (L/min): 2 L/min    Vitals:   06/07/22 1515 06/08/22 0528 06/08/22 0801 06/08/22 0824  BP: (!) 146/83 (!) 145/84 131/83 129/77  Pulse: (!) 136 91 (!) 128 89  Resp: 20 (!) _0 Temp: (!) 97.5 F (36.4 C) 99 F (37.2 C) 97.9 F (36.6 C) 97.9 F (36.6 C)  TempSrc: Oral Oral Oral Oral  SpO2: 93% 95% 94% 94%  Weight:      Height:          Data Reviewed:  Basic Metabolic Panel: Recent Labs  Lab 06/02/22 0429 06/05/22 1708 06/06/22 0732 06/07/22 0901 06/08/22 0542  NA 140 145  --  146* 146*  K 4.3 5.8* 5.2* 4.9 4.5  CL 106 115*  --  117* 115*  CO2 26 21*  --  24 22  GLUCOSE 104* 127*  --  133* 97  BUN 33* 41*  --  44* 46*  CREATININE 1.11* 1.39*  --  1.17* 1.04*  CALCIUM 8.9 9.1  --  8.5* 8.5*  MG 2.4  --   --   --   --     CBC: Recent Labs  Lab 06/02/22 0429 06/03/22 0817 06/05/22 1708  WBC 6.6 8.0 12.5*  NEUTROABS  --  5.8  --   HGB 11.0* 11.4* 12.4  HCT 35.5* 36.1 41.4  MCV 91.7 90.9 95.6  PLT PLT CLUMPING NOTED, COLLECT IN CITRATE FOR CBC PLATELET CLUMPS NOTED ON SMEAR, COUNT APPEARS DECREASED 142*    LFT Recent Labs  Lab 06/02/22 0429 06/05/22 1708  AST 24 27  ALT 17 15  ALKPHOS 93 98  BILITOT 1.5* 1.3*  PROT 6.5 7.0  ALBUMIN 2.9* 3.1*     Antibiotics: Anti-infectives (From admission, onward)    Start     Dose/Rate Route Frequency Ordered Stop   05/31/22 1700  meropenem (MERREM) 1 g in sodium chloride 0.9 % 100 mL IVPB        1 g 200 mL/hr over 30 Minutes Intravenous Every 12 hours 05/31/22 1605 06/06/22 2359   05/29/22 2100  ampicillin  (OMNIPEN) 1 g in sodium chloride 0.9 % 100 mL IVPB  Status:  Discontinued        1 g 300 mL/hr over 20 Minutes Intravenous 2 times daily 05/29/22 2016 05/29/22 2020   05/29/22 2100  ampicillin (OMNIPEN) 2 g in sodium chloride 0.9 % 100 mL IVPB  Status:  Discontinued        2 g 300 mL/hr over 20 Minutes Intravenous 2 times daily 05/29/22 2020 05/31/22 1605        DVT prophylaxis: SCDs, Xarelto he  Code Status: Full code  Family Communication: Spoke to patient's son and daughter-in-law at bedside, explained to them that patient is not a good candidate for artificial nutrition with PEG tube.  If she does not improve in next few days then family is open to consider comfort measures and this advanced dementia patient with behavior disturbance.  CONSULTS    Objective    Physical Examination:  General-appears in no acute distress Heart-S1-S2, regular, no murmur auscultated Lungs-clear to auscultation bilaterally, no wheezing or crackles auscultated Abdomen-soft, nontender, no organomegaly Extremities-no edema in the lower extremities Neuro-alert, not oriented x3, no focal deficit noted   Status is: Inpatient:        Oswald Hillock   [facility] If 7PM-7AM, please contact night-coverage at [email or web address], Office  [phone]   06/08/2022, 1:01 PM  LOS: 10 days

## 2022-06-09 LAB — BASIC METABOLIC PANEL
Anion gap: 7 (ref 5–15)
BUN: 46 mg/dL — ABNORMAL HIGH (ref 8–23)
CO2: 25 mmol/L (ref 22–32)
Calcium: 8.6 mg/dL — ABNORMAL LOW (ref 8.9–10.3)
Chloride: 116 mmol/L — ABNORMAL HIGH (ref 98–111)
Creatinine, Ser: 1.05 mg/dL — ABNORMAL HIGH (ref 0.44–1.00)
GFR, Estimated: 51 mL/min — ABNORMAL LOW (ref >60)
Glucose, Bld: 100 mg/dL — ABNORMAL HIGH (ref 70–99)
Potassium: 4.1 mmol/L (ref 3.5–5.1)
Sodium: 148 mmol/L — ABNORMAL HIGH (ref 135–145)

## 2022-06-09 MED ORDER — FUROSEMIDE 20 MG PO TABS: 20.0000 mg | ORAL_TABLET | Freq: Every day | ORAL | Status: AC

## 2022-06-09 NOTE — TOC Progression Note (Addendum)
Transition of Care [facility]) - Progression Note    Patient Details  Name: [name] Dyer MRN: [id number] Date of Birth: 1932/07/09  [facility]) CM/SW Contact  Aariel Ems, Juliann Pulse, RN Phone Number: 06/09/2022, 9:51 AM  Clinical Narrative: 2nd auth initiated d/t patient prior not being medically stable to d/c to [facility] Pl requirement chemical restraint of haldol must be 24hrs free.  Current navi ID [id number] Josem Kaufmann. -1:25p-received HOZY YQ#[id number] 6/14-checking if [facility] can accept today. -2:17p-Family aware of semi private rm @ [place]-going to rm#[id number],report tel#[phone]. PTAR called. No further CM needs.    Expected Discharge Plan: Skilled Nursing Facility Barriers to Discharge: Insurance Authorization  Expected Discharge Plan and Services Expected Discharge Plan: [place] In-house Referral: Clinical Social Work   Post Acute Care Choice: [place] arrangements for the past 2 months: [place] Expected Discharge Date: 06/06/22                         HH Arranged: RN, PT, OT, Speech Therapy [place] Agency: [facility] [place] [facility] Determinants of Health (SDOH) Interventions    Readmission Risk Interventions    05/31/2022    5:13 PM  Readmission Risk Prevention Plan  Transportation Screening Complete  Medication Review (RN Care Manager) Referral to Pharmacy  PCP or Specialist appointment within 3-5 days of discharge Complete  HRI or Home Care Consult Complete  SW Recovery Care/Counseling Consult Complete  Palliative Care Screening Not [place] Complete

## 2022-06-09 NOTE — TOC Transition Note (Signed)
Transition of Care [facility]) - CM/SW Discharge Note   Patient Details  Name: [name] Dyer MRN: [id number] Date of Birth: 19-Mar-1932  Transition of Care [facility]) CM/SW Contact:  Dessa Phi, RN Phone Number: 06/09/2022, 2:49 PM   Clinical Narrative:  d/c [facility]-rep Lorenza Chick. See prior note. No further CM needs.     Final next level of care: Skilled Nursing Facility Barriers to Discharge: Insurance Authorization   Patient Goals and CMS Choice Patient states their goals for this hospitalization and ongoing recovery are:: To go to SNF for short term rehab, then return back home. CMS Medicare.gov Compare Post Acute Care list provided to:: Patient Represenative (must comment) Choice offered to / list presented to : Adult Children  Discharge Placement   Existing PASRR number confirmed : 06/09/22          Patient [facility] bed at: [facility] Patient to be transferred to facility by: [place] Name of family member notified: [name] son Patient and family notified of of transfer: 06/09/22  Discharge Plan and Services In-house Referral: Clinical Social Work   Post Acute Care Choice: Home Health                    HH Arranged: RN, PT, OT, Speech Therapy [place] Agency: [facility] [place] Determinants of Health (SDOH) Interventions     Readmission Risk Interventions    05/31/2022    5:13 PM  Readmission Risk Prevention Plan  Transportation Screening Complete  Medication Review (RN Care Manager) Referral to Pharmacy  PCP or Specialist appointment within 3-5 days of discharge Complete  HRI or Home Care Consult Complete  SW Recovery Care/Counseling Consult Complete  Palliative Care Screening Not [place] Complete

## 2022-06-09 NOTE — Plan of Care (Signed)
Report called to [facility]. Family notified of DC.   Problem: Education: Goal: Knowledge of General Education information will improve Description: Including pain rating scale, medication(s)/side effects and non-pharmacologic comfort measures Outcome: Adequate for Discharge   Problem: Health Behavior/Discharge Planning: Goal: Ability to manage health-related needs will improve Outcome: Adequate for Discharge   Problem: Clinical Measurements: Goal: Ability to maintain clinical measurements within normal limits will improve Outcome: Adequate for Discharge Goal: Will remain free from infection Outcome: Adequate for Discharge Goal: Diagnostic test results will improve Outcome: Adequate for Discharge Goal: Respiratory complications will improve Outcome: Adequate for Discharge Goal: Cardiovascular complication will be avoided Outcome: Adequate for Discharge   Problem: Activity: Goal: Risk for activity intolerance will decrease Outcome: Adequate for Discharge   Problem: Nutrition: Goal: Adequate nutrition will be maintained Outcome: Adequate for Discharge   Problem: Coping: Goal: Level of anxiety will decrease Outcome: Adequate for Discharge   Problem: Elimination: Goal: Will not experience complications related to bowel motility Outcome: Adequate for Discharge Goal: Will not experience complications related to urinary retention Outcome: Adequate for Discharge   Problem: Pain Managment: Goal: General experience of comfort will improve Outcome: Adequate for Discharge   Problem: Safety: Goal: Ability to remain free from injury will improve Outcome: Adequate for Discharge   Problem: Skin Integrity: Goal: Risk for impaired skin integrity will decrease Outcome: Adequate for Discharge

## 2022-06-09 NOTE — Care Management Important Message (Signed)
Important Message  Patient Details IM Letter placed in Patients room. Name: [name] Dyer MRN: [id number] Date of Birth: 14-Jan-1932   Medicare Important Message Given:  Yes     Kerin Salen 06/09/2022, 12:00 PM

## 2022-06-10 ENCOUNTER — Telehealth: Payer: Self-pay

## 2022-06-10 NOTE — Telephone Encounter (Signed)
Transition Care Management Unsuccessful Follow-up Telephone Call  Date of discharge and from where:  06/07/2022, [facility]   Attempts:  1st Attempt  Reason for unsuccessful TCM follow-up call:  Left voice message

## 2022-06-11 NOTE — Telephone Encounter (Signed)
Transition Care Management Unsuccessful Follow-up Telephone Call  Date of discharge and from where:  06/09/2022, [facility]   Attempts:  2nd Attempt  Reason for unsuccessful TCM follow-up call:  No answer/busy

## 2022-06-16 ENCOUNTER — Encounter (HOSPITAL_COMMUNITY): Payer: Self-pay | Admitting: Family Medicine

## 2022-06-16 ENCOUNTER — Emergency Department (HOSPITAL_COMMUNITY): Payer: Medicare Other

## 2022-06-16 ENCOUNTER — Other Ambulatory Visit: Payer: Self-pay

## 2022-06-16 ENCOUNTER — Inpatient Hospital Stay (HOSPITAL_COMMUNITY)
Admission: EM | Admit: 2022-06-16 | Discharge: 2022-07-29 | DRG: 056 | Disposition: E | Payer: Medicare Other | Attending: Internal Medicine | Admitting: Internal Medicine

## 2022-06-16 DIAGNOSIS — J810 Acute pulmonary edema: Principal | ICD-10-CM

## 2022-06-16 DIAGNOSIS — J9601 Acute respiratory failure with hypoxia: Secondary | ICD-10-CM | POA: Diagnosis present

## 2022-06-16 DIAGNOSIS — G9340 Encephalopathy, unspecified: Secondary | ICD-10-CM | POA: Diagnosis present

## 2022-06-16 DIAGNOSIS — R7401 Elevation of levels of liver transaminase levels: Secondary | ICD-10-CM | POA: Diagnosis not present

## 2022-06-16 DIAGNOSIS — F0280 Dementia in other diseases classified elsewhere without behavioral disturbance: Secondary | ICD-10-CM | POA: Diagnosis present

## 2022-06-16 DIAGNOSIS — I5082 Biventricular heart failure: Secondary | ICD-10-CM | POA: Diagnosis present

## 2022-06-16 DIAGNOSIS — R609 Edema, unspecified: Secondary | ICD-10-CM | POA: Diagnosis not present

## 2022-06-16 DIAGNOSIS — I4510 Unspecified right bundle-branch block: Secondary | ICD-10-CM | POA: Diagnosis present

## 2022-06-16 DIAGNOSIS — Z79891 Long term (current) use of opiate analgesic: Secondary | ICD-10-CM

## 2022-06-16 DIAGNOSIS — Z20822 Contact with and (suspected) exposure to covid-19: Secondary | ICD-10-CM | POA: Diagnosis present

## 2022-06-16 DIAGNOSIS — E871 Hypo-osmolality and hyponatremia: Secondary | ICD-10-CM | POA: Diagnosis present

## 2022-06-16 DIAGNOSIS — I5033 Acute on chronic diastolic (congestive) heart failure: Secondary | ICD-10-CM | POA: Diagnosis present

## 2022-06-16 DIAGNOSIS — E875 Hyperkalemia: Secondary | ICD-10-CM | POA: Diagnosis present

## 2022-06-16 DIAGNOSIS — R339 Retention of urine, unspecified: Secondary | ICD-10-CM | POA: Diagnosis not present

## 2022-06-16 DIAGNOSIS — G309 Alzheimer's disease, unspecified: Secondary | ICD-10-CM | POA: Diagnosis present

## 2022-06-16 DIAGNOSIS — N179 Acute kidney failure, unspecified: Secondary | ICD-10-CM | POA: Diagnosis present

## 2022-06-16 DIAGNOSIS — I10 Essential (primary) hypertension: Secondary | ICD-10-CM | POA: Diagnosis not present

## 2022-06-16 DIAGNOSIS — R54 Age-related physical debility: Secondary | ICD-10-CM | POA: Diagnosis present

## 2022-06-16 DIAGNOSIS — E860 Dehydration: Secondary | ICD-10-CM | POA: Diagnosis present

## 2022-06-16 DIAGNOSIS — R601 Generalized edema: Secondary | ICD-10-CM

## 2022-06-16 DIAGNOSIS — R338 Other retention of urine: Secondary | ICD-10-CM | POA: Diagnosis not present

## 2022-06-16 DIAGNOSIS — N1832 Chronic kidney disease, stage 3b: Secondary | ICD-10-CM | POA: Diagnosis present

## 2022-06-16 DIAGNOSIS — E43 Unspecified severe protein-calorie malnutrition: Secondary | ICD-10-CM | POA: Diagnosis present

## 2022-06-16 DIAGNOSIS — R0902 Hypoxemia: Secondary | ICD-10-CM

## 2022-06-16 DIAGNOSIS — Z90710 Acquired absence of both cervix and uterus: Secondary | ICD-10-CM

## 2022-06-16 DIAGNOSIS — I4891 Unspecified atrial fibrillation: Secondary | ICD-10-CM | POA: Diagnosis not present

## 2022-06-16 DIAGNOSIS — Z881 Allergy status to other antibiotic agents status: Secondary | ICD-10-CM

## 2022-06-16 DIAGNOSIS — M810 Age-related osteoporosis without current pathological fracture: Secondary | ICD-10-CM | POA: Diagnosis present

## 2022-06-16 DIAGNOSIS — A419 Sepsis, unspecified organism: Secondary | ICD-10-CM | POA: Diagnosis present

## 2022-06-16 DIAGNOSIS — Z7189 Other specified counseling: Secondary | ICD-10-CM | POA: Diagnosis not present

## 2022-06-16 DIAGNOSIS — R627 Adult failure to thrive: Secondary | ICD-10-CM | POA: Diagnosis not present

## 2022-06-16 DIAGNOSIS — Z888 Allergy status to other drugs, medicaments and biological substances status: Secondary | ICD-10-CM

## 2022-06-16 DIAGNOSIS — D631 Anemia in chronic kidney disease: Secondary | ICD-10-CM | POA: Diagnosis present

## 2022-06-16 DIAGNOSIS — Z6825 Body mass index (BMI) 25.0-25.9, adult: Secondary | ICD-10-CM

## 2022-06-16 DIAGNOSIS — N390 Urinary tract infection, site not specified: Secondary | ICD-10-CM | POA: Diagnosis present

## 2022-06-16 DIAGNOSIS — E8809 Other disorders of plasma-protein metabolism, not elsewhere classified: Secondary | ICD-10-CM | POA: Diagnosis present

## 2022-06-16 DIAGNOSIS — G9341 Metabolic encephalopathy: Secondary | ICD-10-CM | POA: Diagnosis present

## 2022-06-16 DIAGNOSIS — Z515 Encounter for palliative care: Secondary | ICD-10-CM | POA: Diagnosis not present

## 2022-06-16 DIAGNOSIS — I951 Orthostatic hypotension: Secondary | ICD-10-CM | POA: Diagnosis not present

## 2022-06-16 DIAGNOSIS — Z8262 Family history of osteoporosis: Secondary | ICD-10-CM

## 2022-06-16 DIAGNOSIS — L89150 Pressure ulcer of sacral region, unstageable: Secondary | ICD-10-CM | POA: Diagnosis present

## 2022-06-16 DIAGNOSIS — Z96642 Presence of left artificial hip joint: Secondary | ICD-10-CM | POA: Diagnosis present

## 2022-06-16 DIAGNOSIS — R6510 Systemic inflammatory response syndrome (SIRS) of non-infectious origin without acute organ dysfunction: Secondary | ICD-10-CM | POA: Diagnosis not present

## 2022-06-16 DIAGNOSIS — Z7983 Long term (current) use of bisphosphonates: Secondary | ICD-10-CM

## 2022-06-16 DIAGNOSIS — F05 Delirium due to known physiological condition: Secondary | ICD-10-CM | POA: Diagnosis not present

## 2022-06-16 DIAGNOSIS — I959 Hypotension, unspecified: Secondary | ICD-10-CM | POA: Diagnosis not present

## 2022-06-16 DIAGNOSIS — Z79899 Other long term (current) drug therapy: Secondary | ICD-10-CM

## 2022-06-16 DIAGNOSIS — Z66 Do not resuscitate: Secondary | ICD-10-CM | POA: Diagnosis not present

## 2022-06-16 DIAGNOSIS — E876 Hypokalemia: Secondary | ICD-10-CM | POA: Diagnosis not present

## 2022-06-16 DIAGNOSIS — Z882 Allergy status to sulfonamides status: Secondary | ICD-10-CM

## 2022-06-16 DIAGNOSIS — E1122 Type 2 diabetes mellitus with diabetic chronic kidney disease: Secondary | ICD-10-CM | POA: Diagnosis present

## 2022-06-16 DIAGNOSIS — B961 Klebsiella pneumoniae [K. pneumoniae] as the cause of diseases classified elsewhere: Secondary | ICD-10-CM | POA: Diagnosis present

## 2022-06-16 DIAGNOSIS — D696 Thrombocytopenia, unspecified: Secondary | ICD-10-CM | POA: Diagnosis present

## 2022-06-16 DIAGNOSIS — I130 Hypertensive heart and chronic kidney disease with heart failure and stage 1 through stage 4 chronic kidney disease, or unspecified chronic kidney disease: Secondary | ICD-10-CM | POA: Diagnosis present

## 2022-06-16 DIAGNOSIS — R1310 Dysphagia, unspecified: Secondary | ICD-10-CM | POA: Diagnosis not present

## 2022-06-16 DIAGNOSIS — E870 Hyperosmolality and hypernatremia: Secondary | ICD-10-CM | POA: Diagnosis present

## 2022-06-16 DIAGNOSIS — L8990 Pressure ulcer of unspecified site, unspecified stage: Secondary | ICD-10-CM | POA: Diagnosis present

## 2022-06-16 DIAGNOSIS — A4150 Gram-negative sepsis, unspecified: Secondary | ICD-10-CM | POA: Diagnosis present

## 2022-06-16 DIAGNOSIS — E861 Hypovolemia: Secondary | ICD-10-CM | POA: Diagnosis present

## 2022-06-16 DIAGNOSIS — Z7901 Long term (current) use of anticoagulants: Secondary | ICD-10-CM

## 2022-06-16 DIAGNOSIS — I480 Paroxysmal atrial fibrillation: Secondary | ICD-10-CM | POA: Diagnosis not present

## 2022-06-16 DIAGNOSIS — I5030 Unspecified diastolic (congestive) heart failure: Secondary | ICD-10-CM | POA: Diagnosis not present

## 2022-06-16 DIAGNOSIS — I4821 Permanent atrial fibrillation: Secondary | ICD-10-CM | POA: Diagnosis present

## 2022-06-16 DIAGNOSIS — R6520 Severe sepsis without septic shock: Secondary | ICD-10-CM | POA: Diagnosis not present

## 2022-06-16 DIAGNOSIS — R32 Unspecified urinary incontinence: Secondary | ICD-10-CM | POA: Diagnosis present

## 2022-06-16 DIAGNOSIS — E785 Hyperlipidemia, unspecified: Secondary | ICD-10-CM | POA: Diagnosis present

## 2022-06-16 LAB — COMPREHENSIVE METABOLIC PANEL WITH GFR
ALT: 84 U/L — ABNORMAL HIGH (ref 0–44)
AST: 99 U/L — ABNORMAL HIGH (ref 15–41)
Albumin: 2.4 g/dL — ABNORMAL LOW (ref 3.5–5.0)
Alkaline Phosphatase: 91 U/L (ref 38–126)
Anion gap: 8 (ref 5–15)
BUN: 40 mg/dL — ABNORMAL HIGH (ref 8–23)
CO2: 28 mmol/L (ref 22–32)
Calcium: 7.8 mg/dL — ABNORMAL LOW (ref 8.9–10.3)
Chloride: 121 mmol/L — ABNORMAL HIGH (ref 98–111)
Creatinine, Ser: 1.37 mg/dL — ABNORMAL HIGH (ref 0.44–1.00)
GFR, Estimated: 37 mL/min — ABNORMAL LOW (ref >60)
Glucose, Bld: 116 mg/dL — ABNORMAL HIGH (ref 70–99)
Potassium: 3.7 mmol/L (ref 3.5–5.1)
Sodium: 157 mmol/L — ABNORMAL HIGH (ref 135–145)
Total Bilirubin: 1.4 mg/dL — ABNORMAL HIGH (ref 0.3–1.2)
Total Protein: 5.8 g/dL — ABNORMAL LOW (ref 6.5–8.1)

## 2022-06-16 LAB — CBC WITH DIFFERENTIAL/PLATELET
Abs Immature Granulocytes: 0.08 K/uL — ABNORMAL HIGH (ref 0.00–0.07)
Basophils Absolute: 0.1 K/uL (ref 0.0–0.1)
Basophils Relative: 1 %
Eosinophils Absolute: 0.1 K/uL (ref 0.0–0.5)
Eosinophils Relative: 1 %
HCT: 40.6 % (ref 36.0–46.0)
Hemoglobin: 12.1 g/dL (ref 12.0–15.0)
Immature Granulocytes: 1 %
Lymphocytes Relative: 10 %
Lymphs Abs: 1.1 K/uL (ref 0.7–4.0)
MCH: 28.5 pg (ref 26.0–34.0)
MCHC: 29.8 g/dL — ABNORMAL LOW (ref 30.0–36.0)
MCV: 95.8 fL (ref 80.0–100.0)
Monocytes Absolute: 1.1 K/uL — ABNORMAL HIGH (ref 0.1–1.0)
Monocytes Relative: 10 %
Neutro Abs: 8.7 K/uL — ABNORMAL HIGH (ref 1.7–7.7)
Neutrophils Relative %: 77 %
Platelets: UNDETERMINED K/uL (ref 150–400)
RBC: 4.24 MIL/uL (ref 3.87–5.11)
RDW: 19.4 % — ABNORMAL HIGH (ref 11.5–15.5)
WBC: 11.2 K/uL — ABNORMAL HIGH (ref 4.0–10.5)
nRBC: 0 % (ref 0.0–0.2)

## 2022-06-16 LAB — APTT: aPTT: 32 seconds (ref 24–36)

## 2022-06-16 LAB — BRAIN NATRIURETIC PEPTIDE: B Natriuretic Peptide: 1588.7 pg/mL — ABNORMAL HIGH (ref 0.0–100.0)

## 2022-06-16 LAB — RESP PANEL BY RT-PCR (FLU A&B, COVID) ARPGX2
Influenza A by PCR: NEGATIVE
Influenza B by PCR: NEGATIVE
SARS Coronavirus 2 by RT PCR: NEGATIVE

## 2022-06-16 LAB — PROTIME-INR
INR: 1.5 — ABNORMAL HIGH (ref 0.8–1.2)
Prothrombin Time: 18.3 seconds — ABNORMAL HIGH (ref 11.4–15.2)

## 2022-06-16 LAB — LACTIC ACID, PLASMA: Lactic Acid, Venous: 1.4 mmol/L (ref 0.5–1.9)

## 2022-06-16 LAB — AMMONIA: Ammonia: 31 umol/L (ref 9–35)

## 2022-06-16 MED ORDER — RIVAROXABAN 15 MG PO TABS
15.0000 mg | ORAL_TABLET | Freq: Every day | ORAL | Status: DC
  Administered 2022-06-17 – 2022-07-02 (×15): 15 mg via ORAL
  Filled 2022-06-16 (×15): qty 1

## 2022-06-16 MED ORDER — ROSUVASTATIN CALCIUM 5 MG PO TABS
5.0000 mg | ORAL_TABLET | Freq: Every day | ORAL | Status: DC
  Administered 2022-06-17 – 2022-06-21 (×5): 5 mg via ORAL
  Filled 2022-06-16 (×5): qty 1

## 2022-06-16 MED ORDER — ATENOLOL 50 MG PO TABS
50.0000 mg | ORAL_TABLET | Freq: Every day | ORAL | Status: DC
  Administered 2022-06-17 – 2022-06-20 (×4): 50 mg via ORAL
  Filled 2022-06-16 (×4): qty 1
  Filled 2022-06-16: qty 2

## 2022-06-16 MED ORDER — FUROSEMIDE 10 MG/ML IJ SOLN
40.0000 mg | Freq: Once | INTRAMUSCULAR | Status: AC
  Administered 2022-06-16: 40 mg via INTRAVENOUS
  Filled 2022-06-16: qty 4

## 2022-06-16 MED ORDER — LACTATED RINGERS IV SOLN: INTRAVENOUS | Status: DC

## 2022-06-16 MED ORDER — LACTATED RINGERS IV BOLUS (SEPSIS): 1000.0000 mL | Freq: Once | INTRAVENOUS | Status: DC

## 2022-06-16 MED ORDER — ACETAMINOPHEN 650 MG RE SUPP
650.0000 mg | Freq: Four times a day (QID) | RECTAL | Status: DC | PRN
  Administered 2022-06-25 – 2022-06-26 (×2): 650 mg via RECTAL
  Filled 2022-06-16 (×2): qty 1

## 2022-06-16 MED ORDER — SODIUM CHLORIDE 0.9% FLUSH
3.0000 mL | Freq: Two times a day (BID) | INTRAVENOUS | Status: DC
  Administered 2022-06-17 – 2022-07-06 (×37): 3 mL via INTRAVENOUS

## 2022-06-16 MED ORDER — ONDANSETRON HCL 4 MG PO TABS: 4.0000 mg | ORAL_TABLET | Freq: Four times a day (QID) | ORAL | Status: DC | PRN

## 2022-06-16 MED ORDER — METRONIDAZOLE 500 MG/100ML IV SOLN: 500.0000 mg | Freq: Once | INTRAVENOUS | Status: DC

## 2022-06-16 MED ORDER — TRAZODONE HCL 50 MG PO TABS
50.0000 mg | ORAL_TABLET | Freq: Every evening | ORAL | Status: DC | PRN
  Administered 2022-06-20: 50 mg via ORAL
  Filled 2022-06-16: qty 1

## 2022-06-16 MED ORDER — VANCOMYCIN HCL IN DEXTROSE 1-5 GM/200ML-% IV SOLN: 1000.0000 mg | Freq: Once | INTRAVENOUS | Status: DC

## 2022-06-16 MED ORDER — PANTOPRAZOLE SODIUM 40 MG PO TBEC
40.0000 mg | DELAYED_RELEASE_TABLET | Freq: Every day | ORAL | Status: DC
  Administered 2022-06-17 – 2022-07-05 (×19): 40 mg via ORAL
  Filled 2022-06-16 (×19): qty 1

## 2022-06-16 MED ORDER — ONDANSETRON HCL 4 MG/2ML IJ SOLN
4.0000 mg | Freq: Four times a day (QID) | INTRAMUSCULAR | Status: DC | PRN
  Filled 2022-06-16: qty 2

## 2022-06-16 MED ORDER — MEMANTINE HCL 10 MG PO TABS
5.0000 mg | ORAL_TABLET | Freq: Two times a day (BID) | ORAL | Status: DC
  Administered 2022-06-17 – 2022-07-05 (×35): 5 mg via ORAL
  Filled 2022-06-16 (×39): qty 1

## 2022-06-16 MED ORDER — PIPERACILLIN-TAZOBACTAM 3.375 G IVPB 30 MIN
3.3750 g | Freq: Once | INTRAVENOUS | Status: AC
  Administered 2022-06-16: 3.375 g via INTRAVENOUS
  Filled 2022-06-16: qty 50

## 2022-06-16 MED ORDER — DEXTROSE 5 % IV SOLN: INTRAVENOUS | Status: AC

## 2022-06-16 MED ORDER — ACETAMINOPHEN 325 MG PO TABS
650.0000 mg | ORAL_TABLET | Freq: Four times a day (QID) | ORAL | Status: DC | PRN
  Administered 2022-06-17 – 2022-07-06 (×11): 650 mg via ORAL
  Filled 2022-06-16 (×11): qty 2

## 2022-06-16 MED ORDER — SODIUM CHLORIDE 0.9 % IV SOLN
1.0000 g | Freq: Two times a day (BID) | INTRAVENOUS | Status: DC
  Administered 2022-06-16 – 2022-06-18 (×4): 1 g via INTRAVENOUS
  Filled 2022-06-16 (×5): qty 20

## 2022-06-16 MED ORDER — DONEPEZIL HCL 10 MG PO TABS
10.0000 mg | ORAL_TABLET | Freq: Every day | ORAL | Status: DC
  Administered 2022-06-17 – 2022-07-05 (×17): 10 mg via ORAL
  Filled 2022-06-16 (×21): qty 1

## 2022-06-16 MED ORDER — SODIUM CHLORIDE 0.9 % IV SOLN: 2.0000 g | Freq: Once | INTRAVENOUS | Status: DC

## 2022-06-16 NOTE — ED Provider Notes (Addendum)
[facility] EMERGENCY DEPARTMENT Provider Note   CSN: [id number] Arrival date & time: 06/22/2022  1516     History  Chief Complaint  Patient presents with   Code Sepsis    [name] Dyer is a 88 y.o. female.  Patient with history of dementia, hypertension A-fib CHF with recent diagnosis of pneumonia admission, presents from nursing home chief complaint of decreased activity, increased oxygen requirement.  Family at bedside, denies any new vomiting or diarrhea no new falls or other trauma.  Patient recently admitted for urinary tract infection and sepsis and discharged 10 days ago.  They state that at the nursing home she has been requiring oxygen supplementation for the past 3 days.  Had a fever yesterday.       Home Medications Prior to Admission medications   Medication Sig Start Date End Date Taking? Authorizing Provider  acetaminophen (TYLENOL) 500 MG tablet Take 1,000 mg by mouth every 6 (six) hours as needed for mild pain or headache.     [provider]  alendronate (FOSAMAX) 70 MG tablet Take 1 tablet (70 mg total) by mouth once a week. 01/20/22   Ngetich, [name] C, NP  allopurinol (ZYLOPRIM) 100 MG tablet Take 1 tablet (100 mg total) by mouth daily. 02/10/22   Ngetich, [name] C, NP  atenolol (TENORMIN) 50 MG tablet Take 1 tablet (50 mg total) by mouth daily. 05/23/22   Ngetich, [name] C, NP  calcium carbonate (OS-CAL - DOSED IN MG OF ELEMENTAL CALCIUM) 1250 (500 Ca) MG tablet Take 1 tablet (1,250 mg total) by mouth in the morning. 01/18/22   Margie Billet, PA-C  cholecalciferol (VITAMIN D3) 25 MCG (1000 UT) tablet Take 2,000 Units by mouth daily.    [provider]  colchicine 0.6 MG tablet Take 2 tablets by mouth x 1 dose then one tablet daily x 3 days wait 12 hours prior to resuming Allopurinol Patient taking differently: Take 0.6 mg by mouth daily as needed (flare up). Wait 12 hours prior to resuming Allopurinol 04/28/22   Ngetich, [name]  C, NP  divalproex (DEPAKOTE SPRINKLE) 125 MG capsule Take 2 capsules (250 mg total) by mouth 2 (two) times daily. 06/06/22   Oswald Hillock, MD  docusate sodium (COLACE) 100 MG capsule Take 1 capsule (100 mg total) by mouth 2 (two) times daily. 06/06/22   Oswald Hillock, MD  donepezil (ARICEPT) 10 MG tablet Take 1 tablet (10 mg total) by mouth at bedtime. 07/02/21   Suzzanne Cloud, NP  furosemide (LASIX) 20 MG tablet Take 1 tablet (20 mg total) by mouth daily. 06/10/22   Oswald Hillock, MD  hydrocortisone (ANUSOL-HC) 25 MG suppository Place 1 suppository (25 mg total) rectally 2 (two) times daily as needed for hemorrhoids or anal itching. 06/06/22   Oswald Hillock, MD  ipratropium (ATROVENT) 0.06 % nasal spray CAN USE 2 SPRAYS IN EACH NOSTRIL EVERY 6 HOURS IF NEEDED TO DRY UP RUNNY N0SE 12/04/20   Kozlow, Donnamarie Poag, MD  memantine (NAMENDA) 5 MG tablet Take 1 tablet (5 mg total) by mouth 2 (two) times daily. 07/02/21   Suzzanne Cloud, NP  Menthol, Topical Analgesic, (ICY HOT EX) Apply 1 application. topically at bedtime.    [provider]  Multiple Vitamins-Minerals (MULTIPLE VITAMINS/WOMENS PO) Take 1 tablet by mouth daily.    [provider]  omeprazole (PRILOSEC) 20 MG capsule Take 1 capsule (20 mg total) by mouth daily. 05/09/22   Ngetich, [name] C,  NP  Rivaroxaban (XARELTO) 15 MG TABS tablet TAKE 1 TABLET (15 MG TOTAL) BY MOUTH DAILY WITH SUPPER. 01/08/22   [name], MD  rosuvastatin (CRESTOR) 5 MG tablet Take 1 tablet (5 mg total) by mouth daily. 02/10/22   [name], MD  saccharomyces boulardii (FLORASTOR) 250 MG capsule Take 1 capsule (250 mg total) by mouth 2 (two) times daily. For 10 days. 05/28/22   Ngetich, [name] C, NP  traZODone (DESYREL) 50 MG tablet Take 50 mg by mouth at bedtime as needed for sleep.    [provider]  vitamin B-12 (CYANOCOBALAMIN) 1000 MCG tablet Take 1,000 mcg by mouth daily.    [provider]  vitamin C (ASCORBIC ACID) 250 MG tablet Take 250  mg by mouth daily.    [provider]      Allergies    Ciprofloxacin, Sulfa antibiotics, Nitrofurantoin, Memantine, Succinylsulphathiazole, and Sulfamethoxazole    Review of Systems   Review of Systems  Unable to perform ROS: Dementia    Physical Exam Updated Vital Signs BP 136/75   Pulse (!) 117   Temp 98.8 F (37.1 C) (Oral)   Resp (!) 30   SpO2 98%  Physical Exam Constitutional:      General: She is not in acute distress.    Comments: Patient with eyes open but minimally responsive.  HENT:     Head: Normocephalic.     Nose: Nose normal.  Eyes:     Pupils: Pupils are equal, round, and reactive to light.  Cardiovascular:     Rate and Rhythm: Normal rate.  Pulmonary:     Effort: Respiratory distress present.     Breath sounds: Rhonchi present.  Abdominal:     Tenderness: There is no abdominal tenderness. There is no guarding or rebound.  Musculoskeletal:        General: Normal range of motion.     Cervical back: Normal range of motion.  Neurological:     Comments: Patient is awake eyes open.  Nonverbal.  Not following commands.  Close to her baseline per family at bedside, but appears more fatigued and perhaps more confused than normal per family.     ED Results / Procedures / Treatments   Labs (all labs ordered are listed, but only abnormal results are displayed) Labs Reviewed  COMPREHENSIVE METABOLIC PANEL - Abnormal; Notable for the following components:      Result Value   Sodium 157 (*)    Chloride 121 (*)    Glucose, Bld 116 (*)    BUN 40 (*)    Creatinine, Ser 1.37 (*)    Calcium 7.8 (*)    Total Protein 5.8 (*)    Albumin 2.4 (*)    AST 99 (*)    ALT 84 (*)    Total Bilirubin 1.4 (*)    GFR, Estimated 37 (*)    All other components within normal limits  CBC WITH DIFFERENTIAL/PLATELET - Abnormal; Notable for the following components:   WBC 11.2 (*)    MCHC 29.8 (*)    RDW 19.4 (*)    Neutro Abs 8.7 (*)    Monocytes Absolute 1.1 (*)     Abs Immature Granulocytes 0.08 (*)    All other components within normal limits  PROTIME-INR - Abnormal; Notable for the following components:   Prothrombin Time 18.3 (*)    INR 1.5 (*)    All other components within normal limits  BRAIN NATRIURETIC PEPTIDE - Abnormal; Notable for  the following components:   B Natriuretic Peptide 1,588.7 (*)    All other components within normal limits  RESP PANEL BY RT-PCR (FLU A&B, COVID) ARPGX2  CULTURE, BLOOD (ROUTINE X 2)  CULTURE, BLOOD (ROUTINE X 2)  URINE CULTURE  LACTIC ACID, PLASMA  AMMONIA  APTT  URINALYSIS, ROUTINE W REFLEX MICROSCOPIC    EKG EKG Interpretation  Date/Time:  Monday June 16 2022 16:47:39 EDT Ventricular Rate:  111 PR Interval:    QRS Duration: 121 QT Interval:  312 QTC Calculation: 424 R Axis:   209 Text Interpretation: Atrial fibrillation RBBB and LPFB Nonspecific T abnormalities, lateral leads Confirmed by Thamas Jaegers ([id number]) on 06/27/2022 4:50:55 PM  Radiology DG Chest Portable 1 View  Result Date: 06/10/2022 CLINICAL DATA:  Shortness of breath EXAM: PORTABLE CHEST 1 VIEW COMPARISON:  04/25/2022 FINDINGS: Bilateral pleural effusions, right greater than left. Cardiomegaly with vascular congestion. Airspace disease at the bases. Asymmetric hazy opacity right thorax. No visible pneumothorax. IMPRESSION: 1. Cardiomegaly with vascular congestion bilateral pleural effusions. Airspace disease at the bases could be due to atelectasis or pneumonia 2. Hazy asymmetric opacity right thorax could be due to pleural effusion versus asymmetric edema Electronically Signed   By: Donavan Foil M.D.   On: 06/02/2022 17:51    Procedures .Critical Care  Performed by: Luna Fuse, MD Authorized by: Luna Fuse, MD   Critical care provider statement:    Critical care time (minutes):  40   Critical care time was exclusive of:  Separately billable procedures and treating other patients and teaching time   Critical care was  necessary to treat or prevent imminent or life-threatening deterioration of the following conditions:  Respiratory failure, CNS failure or compromise and metabolic crisis     Medications Ordered in ED Medications  piperacillin-tazobactam (ZOSYN) IVPB 3.375 g (3.375 g Intravenous New Bag/Given 06/27/2022 1903)  furosemide (LASIX) injection 40 mg (40 mg Intravenous Given 06/19/2022 1900)    ED Course/ Medical Decision Making/ A&P Clinical Course as of 06/04/2022 1904  Mon Jun 16, 2022  1650 ED EKG 12-Lead [JH]    Clinical Course User Index [JH] Almyra Free Greggory Brandy, MD                           Medical Decision Making Amount and/or Complexity of Data Reviewed Labs: ordered. Radiology: ordered. ECG/medicine tests: ordered. Decision-making details documented in ED Course.  Risk Prescription drug management. Decision regarding hospitalization.   Review of record shows prior admission in May 29, 2022 for sepsis UTI.  History from family at bedside.  Cardiac monitoring showing atrial fibrillation with rapid rate.  Morbidities influencing complexity include history of dementia nursing home patient, recent admission for sepsis.  Initially seen by triage provider, sepsis protocol initiated.  Have the patient remains afebrile here, lactic acid is normal white count at 11.  Sepsis protocol canceled.  Concern for pulmonary edema fluid overload given chest x-ray shows pulmonary edema.  Started empirically on IV antibiotics due to multiple comorbidities and presentation in general.  Straight cath attempted by nursing team but no urine output noted.  Admitted to the hospitalist team for new onset hypoxemia hypernatremia, acute kidney injury, pulmonary edema.    Final Clinical Impression(s) / ED Diagnoses Final diagnoses:  Acute pulmonary edema (HCC)  Hypoxemia  AKI (acute kidney injury) ([place])  Hypernatremia    Rx / DC Orders ED Discharge Orders     None  Luna Fuse,  MD 06/04/2022 1904    Luna Fuse, MD 06/01/2022 Drema Halon

## 2022-06-16 NOTE — Progress Notes (Signed)
Pharmacy Antibiotic Note  [name] Dyer is a 88 y.o. female admitted on 05/30/2022 presenting with concern for sepsis, hx of ESBL infection (UCx Klebsiella ESBL carbapenem sensitive).  Pharmacy has been consulted for Merrem dosing.  Plan: Merrem 1g IV every 12 hours Monitor renal function, Cx and clinical progression to narrow     Temp (24hrs), Avg:98.8 F (37.1 C), Min:98.8 F (37.1 C), Max:98.8 F (37.1 C)  Recent Labs  Lab 06/03/2022 1638  WBC 11.2*  CREATININE 1.37*  LATICACIDVEN 1.4    CrCl cannot be calculated (Unknown ideal weight.).    Allergies  Allergen Reactions   Ciprofloxacin Diarrhea   Sulfa Antibiotics Hives, Rash and Other (See Comments)    Severe Rash   Nitrofurantoin Rash and Other (See Comments)    Developed severe rash on both legs   Memantine Other (See Comments)    Dizziness. Pt takes this medication at home   Succinylsulphathiazole Rash   Sulfamethoxazole Rash    Bertis Ruddy, PharmD Clinical Pharmacist ED Pharmacist Phone # [phone] 06/15/2022 8:15 PM

## 2022-06-16 NOTE — ED Notes (Signed)
[name] adult brief checked at this time.  No urine output noted.  Will continue to monitor

## 2022-06-16 NOTE — ED Notes (Signed)
Family member updated on plan of care

## 2022-06-16 NOTE — ED Triage Notes (Signed)
Pt BIB [facility] due to sepsis. EMS states facility called out for PTAR to take pt to hospital bc she is septic and she had just left [place]. EMS states there was no nursing staff at the facility when they arrived and can not provide any other info at this time. Pt has dementia at baseline. Pt has a hx of encephalopathy and afib RVR

## 2022-06-16 NOTE — H&P (Signed)
History and Physical    [name] Dyer PQZ:[id number] DOB: 1932/10/30 DOA: 06/20/2022  PCP: [name], NP   Patient coming from: SNF   Chief Complaint: Fever, lethargy   HPI: [name] Dyer is a 88 y.o. female with medical history significant for atrial fibrillation on Xarelto, Alzheimer dementia, chronic diastolic and right-sided heart failure, CKD 3B, and recent admission with ESBL UTI, now returning to the hospital with lethargy and fever.  [name] son is at the bedside and assists with the history.  Patient was discharged from the hospital on 06/09/2022 after treatment for ESBL UTI with acute encephalopathy, acute on chronic kidney disease, and acute on chronic right-sided heart failure.  Her son reports that she had been walking and talking prior to that admission but has not done either since.  She has not been eating or drinking much since the discharge, they were able to get her to take a protein shake today but she will often not swallow when they put food in her mouth recently.  She had a fever of 101 F at the nursing home yesterday, has been requiring supplemental oxygen for the past 2 to 3 days, but has not appeared to have any significant cough and has not been complaining of anything in particular.  She previously had bilateral leg swelling but this has resolved, though she now has bilateral upper extremity edema that her son has been present since the recent hospitalization.  She has been continued on Lasix at the nursing facility and was also started on an LR infusion today.  ED Course: Upon arrival to the ED, patient is found to be afebrile, saturating 97 to 100% on 2 L/min of supplemental oxygen, tachypneic, tachycardic, and with stable blood pressure.  EKG features atrial fibrillation with rate 111, RBBB, and LPFB.  Chest x-ray notable for cardiomegaly with vascular congestion, bilateral pleural effusions, airspace disease in the bases, and asymmetric opacification on the  right.  Chemistry panel notable for sodium 157, BUN 40, creatinine 1.37, AST 99, and ALT 84.  CBC features a decrease to leukocytosis, now 11.2, and clumped platelets.  Lactic acid is normal and BNP has increased to 1589.  She was treated with IV Lasix and Zosyn in the ED.  Review of Systems:  All other systems reviewed and apart from HPI, are negative.  Past Medical History:  Diagnosis Date   Alzheimer disease ([place])    Anemia    Arrhythmia    Cataract    Colon polyps    Community acquired pneumonia 01/2012   LLL   Congestive heart failure ([place])    Hyperlipidemia    Hypertension    Memory difficulty 10/01/2016   Osteoporosis    Permanent atrial fibrillation ([facility])    Pleural effusion, right 01/2012   SVT (supraventricular tachycardia) ([place]) 01/2012   UTI (lower urinary tract infection)     Past Surgical History:  Procedure Laterality Date   [place]   COLONOSCOPY     COLONOSCOPY N/A 06/10/2016   Procedure: COLONOSCOPY;  Surgeon: Manus Gunning, MD;  Location: [facility];  Service: Gastroenterology;  Laterality: N/A;   ESOPHAGOGASTRODUODENOSCOPY N/A 06/10/2016   Procedure: ESOPHAGOGASTRODUODENOSCOPY (EGD);  Surgeon: Manus Gunning, MD;  Location: [facility];  Service: Gastroenterology;  Laterality: N/A;   HEMIARTHROPLASTY HIP  2011   Left femoral neck fracture   RIGHT/LEFT HEART CATH AND CORONARY ANGIOGRAPHY N/A 01/08/2022   Procedure: RIGHT/LEFT HEART CATH AND CORONARY ANGIOGRAPHY;  Surgeon: Leonie Man, MD;  Location: [facility];  Service: Cardiovascular;  Laterality: N/A;   UPPER GASTROINTESTINAL ENDOSCOPY      Social History:   reports that she has never smoked. She has never used smokeless tobacco. She reports that she does not drink alcohol and does not use drugs.  Allergies  Allergen Reactions   Ciprofloxacin Diarrhea   Sulfa Antibiotics Hives, Rash and Other (See Comments)    Severe Rash    Nitrofurantoin Rash and Other (See Comments)    Developed severe rash on both legs   Memantine Other (See Comments)    Dizziness. Pt takes this medication at home   Succinylsulphathiazole Rash   Sulfamethoxazole Rash    Family History  Problem Relation Age of Onset   Heart Problems Mother    Osteoporosis Mother    Stroke Father    Heart Problems Father    Other Brother 67       COVID   Colon polyps Son    Parkinson's disease Son    Neuropathy Son    Heart disease Son    Cancer Grandchild      Prior to Admission medications   Medication Sig Start Date End Date Taking? Authorizing Provider  acetaminophen (TYLENOL) 500 MG tablet Take 1,000 mg by mouth every 6 (six) hours as needed for mild pain or headache.     [provider]  alendronate (FOSAMAX) 70 MG tablet Take 1 tablet (70 mg total) by mouth once a week. 01/20/22   Ngetich, [name] C, NP  allopurinol (ZYLOPRIM) 100 MG tablet Take 1 tablet (100 mg total) by mouth daily. 02/10/22   Ngetich, [name] C, NP  atenolol (TENORMIN) 50 MG tablet Take 1 tablet (50 mg total) by mouth daily. 05/23/22   Ngetich, [name] C, NP  calcium carbonate (OS-CAL - DOSED IN MG OF ELEMENTAL CALCIUM) 1250 (500 Ca) MG tablet Take 1 tablet (1,250 mg total) by mouth in the morning. 01/18/22   Margie Billet, PA-C  cholecalciferol (VITAMIN D3) 25 MCG (1000 UT) tablet Take 2,000 Units by mouth daily.    [provider]  colchicine 0.6 MG tablet Take 2 tablets by mouth x 1 dose then one tablet daily x 3 days wait 12 hours prior to resuming Allopurinol Patient taking differently: Take 0.6 mg by mouth daily as needed (flare up). Wait 12 hours prior to resuming Allopurinol 04/28/22   Ngetich, [name] C, NP  divalproex (DEPAKOTE SPRINKLE) 125 MG capsule Take 2 capsules (250 mg total) by mouth 2 (two) times daily. 06/06/22   Oswald Hillock, MD  docusate sodium (COLACE) 100 MG capsule Take 1 capsule (100 mg total) by mouth 2 (two) times daily. 06/06/22   Oswald Hillock, MD  donepezil (ARICEPT) 10 MG tablet Take 1 tablet (10 mg total) by mouth at bedtime. 07/02/21   Suzzanne Cloud, NP  furosemide (LASIX) 20 MG tablet Take 1 tablet (20 mg total) by mouth daily. 06/10/22   Oswald Hillock, MD  hydrocortisone (ANUSOL-HC) 25 MG suppository Place 1 suppository (25 mg total) rectally 2 (two) times daily as needed for hemorrhoids or anal itching. 06/06/22   Oswald Hillock, MD  ipratropium (ATROVENT) 0.06 % nasal spray CAN USE 2 SPRAYS IN EACH NOSTRIL EVERY 6 HOURS IF NEEDED TO DRY UP RUNNY N0SE 12/04/20   Kozlow, Donnamarie Poag, MD  memantine (NAMENDA) 5 MG tablet Take 1 tablet (5 mg total) by mouth 2 (two) times daily. 07/02/21  Suzzanne Cloud, NP  Menthol, Topical Analgesic, (ICY HOT EX) Apply 1 application. topically at bedtime.    [provider]  Multiple Vitamins-Minerals (MULTIPLE VITAMINS/WOMENS PO) Take 1 tablet by mouth daily.    [provider]  omeprazole (PRILOSEC) 20 MG capsule Take 1 capsule (20 mg total) by mouth daily. 05/09/22   Ngetich, [name] C, NP  Rivaroxaban (XARELTO) 15 MG TABS tablet TAKE 1 TABLET (15 MG TOTAL) BY MOUTH DAILY WITH SUPPER. 01/08/22   [name], MD  rosuvastatin (CRESTOR) 5 MG tablet Take 1 tablet (5 mg total) by mouth daily. 02/10/22   [name], MD  saccharomyces boulardii (FLORASTOR) 250 MG capsule Take 1 capsule (250 mg total) by mouth 2 (two) times daily. For 10 days. 05/28/22   Ngetich, [name] C, NP  traZODone (DESYREL) 50 MG tablet Take 50 mg by mouth at bedtime as needed for sleep.    [provider]  vitamin B-12 (CYANOCOBALAMIN) 1000 MCG tablet Take 1,000 mcg by mouth daily.    [provider]  vitamin C (ASCORBIC ACID) 250 MG tablet Take 250 mg by mouth daily.    [provider]    Physical Exam: Vitals:   05/29/2022 1845 06/02/2022 1900 06/02/2022 1915 06/03/2022 1930  BP: 136/75 133/74 128/81 125/85  Pulse: (!) 117 (!) 107 (!) 109 (!) 125  Resp: (!) 30 (!) 29 (!) 30 (!) 29  Temp:       TempSrc:      SpO2: 98% 98% 97% 97%    Constitutional: No pallor or diaphoresis, listless   Eyes: PERTLA, lids and conjunctivae normal ENMT: Mucous membranes are moist. Posterior pharynx clear of any exudate or lesions.   Neck: supple, no masses  Respiratory: Rapid shallow breathing, fine rales on right, no wheezing   Cardiovascular: Rate ~100 and irregularly irregular. B/l UE edema. No significant JVD. Abdomen: No distension, no tenderness, soft. Bowel sounds active.  Musculoskeletal: no clubbing / cyanosis. No joint deformity upper and lower extremities.   Skin: no significant rashes, lesions, ulcers. Warm, dry, well-perfused. Poor turgor.  Neurologic: CN 2-12 grossly intact. Moving all extremities. Awake, listless, not making eye contact or answering questions.    Labs and Imaging on Admission: I have personally reviewed following labs and imaging studies  CBC: Recent Labs  Lab 05/30/2022 1638  WBC 11.2*  NEUTROABS 8.7*  HGB 12.1  HCT 40.6  MCV 95.8  PLT PLATELET CLUMPS NOTED ON SMEAR, UNABLE TO ESTIMATE   Basic Metabolic Panel: Recent Labs  Lab 05/29/2022 1638  NA 157*  K 3.7  CL 121*  CO2 28  GLUCOSE 116*  BUN 40*  CREATININE 1.37*  CALCIUM 7.8*   GFR: CrCl cannot be calculated (Unknown ideal weight.). Liver Function Tests: Recent Labs  Lab 06/03/2022 1638  AST 99*  ALT 84*  ALKPHOS 91  BILITOT 1.4*  PROT 5.8*  ALBUMIN 2.4*   No results for input(s): "LIPASE", "AMYLASE" in the last 168 hours. Recent Labs  Lab 06/19/2022 1638  AMMONIA 31   Coagulation Profile: Recent Labs  Lab 06/20/2022 1638  INR 1.5*   Cardiac Enzymes: No results for input(s): "CKTOTAL", "CKMB", "CKMBINDEX", "TROPONINI" in the last 168 hours. BNP (last 3 results) Recent Labs    02/13/22 1433 02/20/22 1419 03/11/22 1315  PROBNP 4,177* 4,228* 3,640*   HbA1C: No results for input(s): "HGBA1C" in the last 72 hours. CBG: No results for input(s): "GLUCAP" in the last 168  hours. Lipid Profile: No results for  input(s): "CHOL", "HDL", "LDLCALC", "TRIG", "CHOLHDL", "LDLDIRECT" in the last 72 hours. Thyroid Function Tests: No results for input(s): "TSH", "T4TOTAL", "FREET4", "T3FREE", "THYROIDAB" in the last 72 hours. Anemia Panel: No results for input(s): "VITAMINB12", "FOLATE", "FERRITIN", "TIBC", "IRON", "RETICCTPCT" in the last 72 hours. Urine analysis:    Component Value Date/Time   COLORURINE YELLOW 05/29/2022 1829   APPEARANCEUR HAZY (A) 05/29/2022 1829   LABSPEC 1.012 05/29/2022 1829   PHURINE 6.0 05/29/2022 1829   GLUCOSEU NEGATIVE 05/29/2022 1829   HGBUR SMALL (A) 05/29/2022 1829   BILIRUBINUR NEGATIVE 05/29/2022 1829   BILIRUBINUR negative 05/23/2022 1513   KETONESUR NEGATIVE 05/29/2022 1829   PROTEINUR NEGATIVE 05/29/2022 1829   UROBILINOGEN negative (A) 05/23/2022 1513   UROBILINOGEN 0.2 03/22/2015 1302   NITRITE NEGATIVE 05/29/2022 1829   LEUKOCYTESUR LARGE (A) 05/29/2022 1829   Sepsis Labs: _0 (procalcitonin:4,lacticidven:4) ) Recent Results (from the past 240 hour(s))  Resp Panel by RT-PCR (Flu A&B, Covid) Anterior Nasal Swab     Status: None   Collection Time: 06/14/2022  5:59 PM   Specimen: Anterior Nasal Swab  Result Value Ref Range Status   SARS Coronavirus 2 by RT PCR NEGATIVE NEGATIVE Final    Comment: (NOTE) SARS-CoV-2 target nucleic acids are NOT DETECTED.  The SARS-CoV-2 RNA is generally detectable in upper respiratory specimens during the acute phase of infection. The lowest concentration of SARS-CoV-2 viral copies this assay can detect is 138 copies/mL. A negative result does not preclude SARS-Cov-2 infection and should not be used as the sole basis for treatment or other patient management decisions. A negative result may occur with  improper specimen collection/handling, submission of specimen other than nasopharyngeal swab, presence of viral mutation(s) within the areas targeted by this assay, and inadequate  number of viral copies(<138 copies/mL). A negative result must be combined with clinical observations, patient history, and epidemiological information. The expected result is Negative.  Fact Sheet for Patients:  EntrepreneurPulse.com.au  Fact Sheet for Healthcare Providers:  IncredibleEmployment.be  This test is no t yet approved or cleared by the [place] FDA and  has been authorized for detection and/or diagnosis of SARS-CoV-2 by FDA under an Emergency Use Authorization (EUA). This EUA will remain  in effect (meaning this test can be used) for the duration of the COVID-19 declaration under Section 564(b)(1) of the Act, 21 U.S.C.section 360bbb-3(b)(1), unless the authorization is terminated  or revoked sooner.       Influenza A by PCR NEGATIVE NEGATIVE Final   Influenza B by PCR NEGATIVE NEGATIVE Final    Comment: (NOTE) The Xpert Xpress SARS-CoV-2/FLU/RSV plus assay is intended as an aid in the diagnosis of influenza from Nasopharyngeal swab specimens and should not be used as a sole basis for treatment. Nasal washings and aspirates are unacceptable for Xpert Xpress SARS-CoV-2/FLU/RSV testing.  Fact Sheet for Patients: EntrepreneurPulse.com.au  Fact Sheet for Healthcare Providers: IncredibleEmployment.be  This test is not yet approved or cleared by the [place] FDA and has been authorized for detection and/or diagnosis of SARS-CoV-2 by FDA under an Emergency Use Authorization (EUA). This EUA will remain in effect (meaning this test can be used) for the duration of the COVID-19 declaration under Section 564(b)(1) of the Act, 21 U.S.C. section 360bbb-3(b)(1), unless the authorization is terminated or revoked.  Performed at [facility], [place] [address], [place], [place] [address]      Radiological Exams on Admission: DG Chest Portable 1 View  Result Date: 05/31/2022 CLINICAL DATA:   Shortness of  breath EXAM: PORTABLE CHEST 1 VIEW COMPARISON:  04/25/2022 FINDINGS: Bilateral pleural effusions, right greater than left. Cardiomegaly with vascular congestion. Airspace disease at the bases. Asymmetric hazy opacity right thorax. No visible pneumothorax. IMPRESSION: 1. Cardiomegaly with vascular congestion bilateral pleural effusions. Airspace disease at the bases could be due to atelectasis or pneumonia 2. Hazy asymmetric opacity right thorax could be due to pleural effusion versus asymmetric edema Electronically Signed   By: Donavan Foil M.D.   On: 06/09/2022 17:51    EKG: Independently reviewed. Atrial fibrillation, rate 111, RBBB, LPFB, QTc 424.   Assessment/Plan   1. Acute metabolic encephalopathy  - Presents from SNF with lethargy, fever, and has been refusing to eat or drink intermittently   - Likely multifactorial, appears dehydrated and has metabolic derangements, may have recurrent infection, and this is on background of advanced dementia  - Start D5W infusion, continue empiric antibiotic, use delirium precautions      2. Acute hypoxic respiratory failure  - She has been requiring 2-3 Lpm supplemental O2 the last 3 days, had fever, and has increased BNP and question of asymmetric edema and pleural effusions on CXR  - She was given IV Lasix in ED but has not urinated yet  - Continue empiric antibiotic, check procalcitonin, consider additional Lasix    3. SIRS  - She had fever at SNF and has tachypnea and tachycardia in ED  - Blood and urine cultures were ordered in ED but she has not urinated yet and no urine obtained on I&O cath  - She recently had ESBL UTI, is currently critically-ill, and will be started on meropenem for now pending UA and cultures   4. Atrial fibrillation  - She has known a fib on Xarelto and rate 100-120s in ED  - Continue Xarelto and atenolol    5. AKI on CKD IIIb; hypernatremia  - SCr is 1.37 on admission, up from baseline of 1.05  - She is  hypernatremic, has not been eating or drinking much in the past week, and has poor skin turgor and dry mouth though BNP has increased and there is question of asymmetric edema and pleural effusion on CXR  - She was given IV Lasix in ED and has not urinated yet  - Plan to hold additional Lasix for now while repeating chem panel, starting slow infusion of D5W, renally-dosing medications    6. Dementia  - Per her son, she was still walking and talking prior to recent hospitalization but not since  - She developed delirium during recent hospitalization and required Haldol  - Has been refusing to eat recently, possibly from acute illness, or from advancing dementia  - Institute deliiurm precautions, continue Namenda and Aricept  7. Elevated LFTs  - Mild elevation in t bili appears chronic, now new elevation in transaminases noted  - Abdominal exam is benign, possibly from Rt heart failure/congestion  - Check CK, trend LFTs, consider imaging   8. Chronic diastolic CHF; chronic RV failure  - Has not been eating or drinking much in the past wk, appears dehydrated, and is hypernatremic, but also has increased BNP and question of asymmetric edema and pleural effusions on CXR  - She was given IV Lasix in ED - Hold additional Lasix for now while repeating chem panel, monitoring strict I/Os and daily wts   9. Goals of care  - Pt has advanced dementia, long hospitalization earlier this month, no longer walking or talking, and often refusing to eat now; she  currently appears critically ill   - Discussed GOC with son at bedside; he was upset that SNF personnel had brought up the concept of palliative care earlier today and believes the patient would want aggressive care including CPR with intubation and life support if indicated  - Continue discussions    DVT prophylaxis: Xarelto  Code Status: Discussed with son on admission; he believes the patient would want CPR and life support  Level of Care: Level  of care: Progressive Family Communication: Son at bedside  Disposition Plan:  Patient is from: SNF  Anticipated d/c is to: SNF  Anticipated d/c date is: 06/20/22  Patient currently: Pending improved mental status and respiratory status, tolerance of adequate oral intake, stable renal function and electrolytes  Consults called: none  Admission status: Inpatient     [name], MD [facility]  06/19/2022, 8:10 PM

## 2022-06-16 NOTE — Sepsis Progress Note (Signed)
Sepsis protocol is being followed by [facility].

## 2022-06-16 NOTE — ED Notes (Signed)
Pts diaper is dry and RN unsuccessful with in and out cath

## 2022-06-17 DIAGNOSIS — I10 Essential (primary) hypertension: Secondary | ICD-10-CM

## 2022-06-17 DIAGNOSIS — G9340 Encephalopathy, unspecified: Secondary | ICD-10-CM | POA: Diagnosis not present

## 2022-06-17 DIAGNOSIS — N179 Acute kidney failure, unspecified: Secondary | ICD-10-CM | POA: Diagnosis not present

## 2022-06-17 DIAGNOSIS — R6520 Severe sepsis without septic shock: Secondary | ICD-10-CM

## 2022-06-17 DIAGNOSIS — A419 Sepsis, unspecified organism: Secondary | ICD-10-CM

## 2022-06-17 DIAGNOSIS — J9601 Acute respiratory failure with hypoxia: Secondary | ICD-10-CM | POA: Diagnosis not present

## 2022-06-17 DIAGNOSIS — I5033 Acute on chronic diastolic (congestive) heart failure: Secondary | ICD-10-CM | POA: Diagnosis not present

## 2022-06-17 LAB — URINALYSIS, ROUTINE W REFLEX MICROSCOPIC
Bacteria, UA: NONE SEEN
Bilirubin Urine: NEGATIVE
Glucose, UA: NEGATIVE mg/dL
Ketones, ur: NEGATIVE mg/dL
Nitrite: POSITIVE — AB
Protein, ur: NEGATIVE mg/dL
Specific Gravity, Urine: 1.01 (ref 1.005–1.030)
WBC, UA: >50 WBC/hpf — ABNORMAL HIGH (ref 0–5)
pH: 5 (ref 5.0–8.0)

## 2022-06-17 LAB — BLOOD CULTURE ID PANEL (REFLEXED) - BCID2

## 2022-06-17 LAB — COMPREHENSIVE METABOLIC PANEL
ALT: 72 U/L — ABNORMAL HIGH (ref 0–44)
AST: 72 U/L — ABNORMAL HIGH (ref 15–41)
Albumin: 2.3 g/dL — ABNORMAL LOW (ref 3.5–5.0)
Alkaline Phosphatase: 82 U/L (ref 38–126)
Anion gap: 9 (ref 5–15)
BUN: 41 mg/dL — ABNORMAL HIGH (ref 8–23)
CO2: 29 mmol/L (ref 22–32)
Calcium: 7.9 mg/dL — ABNORMAL LOW (ref 8.9–10.3)
Chloride: 118 mmol/L — ABNORMAL HIGH (ref 98–111)
Creatinine, Ser: 1.39 mg/dL — ABNORMAL HIGH (ref 0.44–1.00)
GFR, Estimated: 36 mL/min — ABNORMAL LOW (ref >60)
Glucose, Bld: 126 mg/dL — ABNORMAL HIGH (ref 70–99)
Potassium: 3.3 mmol/L — ABNORMAL LOW (ref 3.5–5.1)
Sodium: 156 mmol/L — ABNORMAL HIGH (ref 135–145)
Total Bilirubin: 1.3 mg/dL — ABNORMAL HIGH (ref 0.3–1.2)
Total Protein: 5.6 g/dL — ABNORMAL LOW (ref 6.5–8.1)

## 2022-06-17 LAB — BASIC METABOLIC PANEL
Anion gap: 11 (ref 5–15)
BUN: 50 mg/dL — ABNORMAL HIGH (ref 8–23)
CO2: 28 mmol/L (ref 22–32)
Calcium: 7.9 mg/dL — ABNORMAL LOW (ref 8.9–10.3)
Chloride: 114 mmol/L — ABNORMAL HIGH (ref 98–111)
Creatinine, Ser: 1.55 mg/dL — ABNORMAL HIGH (ref 0.44–1.00)
GFR, Estimated: 32 mL/min — ABNORMAL LOW (ref >60)
Glucose, Bld: 170 mg/dL — ABNORMAL HIGH (ref 70–99)
Potassium: 3.6 mmol/L (ref 3.5–5.1)
Sodium: 153 mmol/L — ABNORMAL HIGH (ref 135–145)

## 2022-06-17 LAB — CBC
HCT: 42.3 % (ref 36.0–46.0)
Hemoglobin: 12.6 g/dL (ref 12.0–15.0)
MCH: 28.5 pg (ref 26.0–34.0)
MCHC: 29.8 g/dL — ABNORMAL LOW (ref 30.0–36.0)
MCV: 95.7 fL (ref 80.0–100.0)
Platelets: UNDETERMINED 10*3/uL (ref 150–400)
RBC: 4.42 MIL/uL (ref 3.87–5.11)
RDW: 18.9 % — ABNORMAL HIGH (ref 11.5–15.5)
WBC: 11.1 10*3/uL — ABNORMAL HIGH (ref 4.0–10.5)
nRBC: 0 % (ref 0.0–0.2)

## 2022-06-17 LAB — PROCALCITONIN: Procalcitonin: 0.33 ng/mL

## 2022-06-17 LAB — SODIUM, URINE, RANDOM: Sodium, Ur: 132 mmol/L

## 2022-06-17 LAB — CREATININE, URINE, RANDOM: Creatinine, Urine: 23.37 mg/dL

## 2022-06-17 MED ORDER — DEXTROSE 5 % IV SOLN: INTRAVENOUS | Status: DC

## 2022-06-17 MED ORDER — ENSURE ENLIVE PO LIQD
237.0000 mL | Freq: Two times a day (BID) | ORAL | Status: DC
  Administered 2022-06-17 – 2022-06-18 (×4): 237 mL via ORAL
  Filled 2022-06-17: qty 237

## 2022-06-17 NOTE — Progress Notes (Signed)
PHARMACY - PHYSICIAN COMMUNICATION CRITICAL VALUE ALERT - BLOOD CULTURE IDENTIFICATION (BCID)  [name] Dyer is an 88 y.o. female who presented to [facility] on 06/11/2022 with a chief complaint of fever/lethargy  Assessment:  48 YOF recently treated with a 7d course of Meropenem for ESBL K PNA UTI - admitted with fever/lethargy and started on antibiotics for r/o UTI now with 1 of 4 blood cultures growing GPC with BCID detecting staph species  Name of physician (or Provider) Contacted: Arrien  Current antibiotics: Meropenem  Changes to prescribed antibiotics recommended:  Patient is on recommended antibiotics - No changes needed Staph species likely representative of contamination  Results for orders placed or performed during the hospital encounter of 06/05/2022  Blood Culture ID Panel (Reflexed) (Collected: 06/12/2022  5:06 PM)  Result Value Ref Range   Enterococcus faecalis NOT DETECTED NOT DETECTED   Enterococcus Faecium NOT DETECTED NOT DETECTED   Listeria monocytogenes NOT DETECTED NOT DETECTED   Staphylococcus species DETECTED (A) NOT DETECTED   Staphylococcus aureus (BCID) NOT DETECTED NOT DETECTED   Staphylococcus epidermidis NOT DETECTED NOT DETECTED   Staphylococcus lugdunensis NOT DETECTED NOT DETECTED   Streptococcus species NOT DETECTED NOT DETECTED   Streptococcus agalactiae NOT DETECTED NOT DETECTED   Streptococcus pneumoniae NOT DETECTED NOT DETECTED   Streptococcus pyogenes NOT DETECTED NOT DETECTED   A.calcoaceticus-baumannii NOT DETECTED NOT DETECTED   Bacteroides fragilis NOT DETECTED NOT DETECTED   Enterobacterales NOT DETECTED NOT DETECTED   Enterobacter cloacae complex NOT DETECTED NOT DETECTED   Escherichia coli NOT DETECTED NOT DETECTED   Klebsiella aerogenes NOT DETECTED NOT DETECTED   Klebsiella oxytoca NOT DETECTED NOT DETECTED   Klebsiella pneumoniae NOT DETECTED NOT DETECTED   Proteus species NOT DETECTED NOT DETECTED   Salmonella species NOT  DETECTED NOT DETECTED   Serratia marcescens NOT DETECTED NOT DETECTED   Haemophilus influenzae NOT DETECTED NOT DETECTED   Neisseria meningitidis NOT DETECTED NOT DETECTED   Pseudomonas aeruginosa NOT DETECTED NOT DETECTED   Stenotrophomonas maltophilia NOT DETECTED NOT DETECTED   Candida albicans NOT DETECTED NOT DETECTED   Candida auris NOT DETECTED NOT DETECTED   Candida glabrata NOT DETECTED NOT DETECTED   Candida krusei NOT DETECTED NOT DETECTED   Candida parapsilosis NOT DETECTED NOT DETECTED   Candida tropicalis NOT DETECTED NOT DETECTED   Cryptococcus neoformans/gattii NOT DETECTED NOT DETECTED    Thank you for allowing pharmacy to be a part of this patient's care.  Alycia Rossetti, PharmD, BCPS Infectious Diseases Clinical Pharmacist 06/17/2022 2:58 PM   **Pharmacist phone directory can now be found on [email or web address] (PW [email or web address]).  Listed under [place].

## 2022-06-17 NOTE — Evaluation (Signed)
Physical Therapy Evaluation Patient Details Name: [name] Dyer MRN: [id number] DOB: 27-Mar-1932 Today's Date: 06/17/2022  History of Present Illness  Pt is an 88 y/o female admitted from SNF secondary to sepsis from UTI and acute metabolic encephalopathy. PMH includes dementia, CHF, a fib, HTN, CKD, and CHF.  Clinical Impression  Pt admitted secondary to problem above with deficits below. Pt requiring total A for rolling and positioning in bed. Pt resistive to PROM in extremities and to movement in general. HR elevated to upper 120s during bed mobility tasks. Recommending return to SNF at d/c for continued rehab. Will continue to follow acutely.      Recommendations for follow up therapy are one component of a multi-disciplinary discharge planning process, led by the attending physician.  Recommendations may be updated based on patient status, additional functional criteria and insurance authorization.  Follow Up Recommendations Skilled nursing-short term rehab (<3 hours/day)    Assistance Recommended at Discharge Frequent or constant Supervision/Assistance  Patient can return home with the following  Two people to help with walking and/or transfers;Assistance with feeding;Assistance with cooking/housework;Assist for transportation;Direct supervision/assist for medications management;Two people to help with bathing/dressing/bathroom;Direct supervision/assist for financial management;Help with stairs or ramp for entrance    Equipment Recommendations None recommended by PT  Recommendations for Other Services       Functional Status Assessment Patient has had a recent decline in their functional status and demonstrates the ability to make significant improvements in function in a reasonable and predictable amount of time.     Precautions / Restrictions Precautions Precautions: Fall Restrictions Weight Bearing Restrictions: No      Mobility  Bed Mobility Overal bed mobility: Needs  Assistance Bed Mobility: Rolling Rolling: Total assist, +2 for physical assistance         General bed mobility comments: total A to roll and position on side. Attempted to assist pt with coming into long sitting, however, pt very resistive    Transfers                   General transfer comment: unable to attempt    Ambulation/Gait                  Stairs            Wheelchair Mobility    Modified Rankin (Stroke Patients Only)       Balance                                             Pertinent Vitals/Pain      Home Living Family/patient expects to be discharged to:: Skilled nursing facility                        Prior Function Prior Level of Function : Needs assist             Mobility Comments: Required assist for mobility tasks ADLs Comments: Required assist from staff for ADLs.     Hand Dominance        Extremity/Trunk Assessment   Upper Extremity Assessment Upper Extremity Assessment: Defer to OT evaluation    Lower Extremity Assessment Lower Extremity Assessment: Generalized weakness (resistive to PROM attempts at hip and knee)    Cervical / Trunk Assessment Cervical / Trunk Assessment: Kyphotic  Communication   Communication: No difficulties  Cognition Arousal/Alertness:  Awake/alert Behavior During Therapy: Flat affect Overall Cognitive Status: History of cognitive impairments - at baseline                                 General Comments: hx of advanced dementia; pt with little verbalization, provided with multimodal cues and pt inconsistent with assisting        General Comments General comments (skin integrity, edema, etc.): [name] son present during session. HR elevating to upper 120s with bed level activities    Exercises     Assessment/Plan    PT Assessment Patient needs continued PT services  PT Problem List Decreased strength;Decreased activity  tolerance;Decreased balance;Decreased mobility;Decreased cognition;Decreased knowledge of use of DME;Decreased safety awareness;Decreased range of motion       PT Treatment Interventions DME instruction;Gait training;Functional mobility training;Therapeutic activities;Therapeutic exercise;Balance training;Patient/family education;Cognitive remediation    PT Goals (Current goals can be found in the Care Plan section)  Acute Rehab PT Goals Patient Stated Goal: none stated PT Goal Formulation: With patient/family Time For Goal Achievement: 07/01/22 Potential to Achieve Goals: Fair    Frequency Min 2X/week     Co-evaluation               AM-PAC PT "6 Clicks" Mobility  Outcome Measure Help needed turning from your back to your side while in a flat bed without using bedrails?: Total Help needed moving from lying on your back to sitting on the side of a flat bed without using bedrails?: Total Help needed moving to and from a bed to a chair (including a wheelchair)?: Total Help needed standing up from a chair using your arms (e.g., wheelchair or bedside chair)?: Total Help needed to walk in hospital room?: Total Help needed climbing 3-5 steps with a railing? : Total 6 Click Score: 6    End of Session   Activity Tolerance: Patient limited by fatigue Patient left: in bed;with call bell/phone within reach;with family/visitor present (on stretcher in ED positioned onto R side) Nurse Communication: Mobility status PT Visit Diagnosis: Other abnormalities of gait and mobility (R26.89);Muscle weakness (generalized) (M62.81);History of falling (Z91.81)    Time: 5056-9794 PT Time Calculation (min) (ACUTE ONLY): 17 min   Charges:   PT Evaluation $PT Eval Moderate Complexity: 1 Mod          Reuel Derby, PT, DPT  Acute Rehabilitation Services  Office: [phone]   Rudean Hitt 06/17/2022, 12:51 PM

## 2022-06-17 NOTE — Assessment & Plan Note (Addendum)
On atenolol for blood pressure control.   Dyslipidemia, continue statin therapy.

## 2022-06-17 NOTE — ED Notes (Signed)
Pt is eating breakfast at this time. Son is at bedside feeding pt. Tolerating meds crushed and mixed in applesauce.

## 2022-06-17 NOTE — Assessment & Plan Note (Addendum)
Echocardiogram from 12/2021 with preserved LV systolic function but mild reduction in RV systolic function.   Peripheral edema possibly related to poor oncotic pressure.

## 2022-06-17 NOTE — Assessment & Plan Note (Addendum)
Positive RVR.   Required IV diltiazem drip for rate control.  This am rate has improved, continue in atrial fibrillation rhythm.  Plan to continue metoprolol 50 mg po bid Add oral diltiazem 30 mg qid and wean diltiazem infusion to heart rate less than 100 bpm.   Anticoagulation with rivaroxaban.

## 2022-06-17 NOTE — Progress Notes (Signed)
Progress Note   Patient: [name] Dyer AQT:[id number] DOB: February 20, 1932 DOA: 06/17/2022     1 DOS: the patient was seen and examined on 06/17/2022   Brief hospital course: [name] was admitted to the hospital with the working diagnosis of acute metabolic encephalopathy.   88 yo female with the past medical history of atrial fibrillation, dementia, chronic heart failure, CKD stage 3b, recent hospitalization for ESBL Klebsiella urine infection (05/29/22 to 06/09/22). Patient was discharged to SNF with severe deconditioning. Post discharge patient had very poor oral intake, increase 02 requirements for 2 to 3 days and fever the day prior to rehospitalization.On her initial physical examination her blood pressure was 136/75, HR 117, RR 30 and 02 saturation 98%, lungs with rales on the right side, heart with S1 and S2 present and tachycardic with no gallops, or rubs, abdomen soft and not distended, positive bilateral lower extremity edema.   Na 157, K 3,7 CL 121, bicarbonate 28, glucose 116, bun 40 cr 1,37  AST 99 ALT 84 BNP 1,588 Wbc 11,2, hgb 12,1 plt clumped.  Sars covid 19 negative   Urine analysis with positive nitrate, wbc >50, SG 1,010 Urine Na 132.  Chest radiograph with cardiomegaly and moderate right pleural effusion, small left pleural effusion.   EKG 111 bpm, right axis deviation, right bundle branch block, atrial fibrillation rhythm with no significant ST segment or T wave changes.   Patient was placed on antibiotic therapy and IV fluids.   Assessment and Plan: * Acute encephalopathy Acute metabolic encephalopathy with delirium, likely multifactorial, related to urine infection, dehydration, hypoxemia, hypernatremia and poor nutrition.   [name] son at the bedside this am, reported improvement in patient's mentation. Plan to continue neuro checks per unit protocol, ok to down grade to medical telemetry.  Aspiration precautions and speech and swallow evaluation.   Continue to correct electrolytes and antibiotic therapy for urine infection.    Acute renal failure superimposed on stage 3b chronic kidney disease (HCC) Hypernatremia. Hypokalemia.   Hypovolemic hyponatremia. [name] calculated water deficit is 3,314 ml.  Renal function today with serum cr at 1,39 with K at 3,3 and serum bicarbonate at 29, with Na 156 and Cl 118.   Plan to continue with D5 W at 75 ml per hr and follow up renal function and electrolytes this pm.  Caution with IV fluids to prevent worsening third spacing.  Her edema likely is related to low oncotic pressure related to hypo proteinemia due to poor nutrition. Consult nutrition for nutritional supplementation.    Sepsis due to gram-negative UTI (HCC) Severe sepsis, present on admission, end organ failure with encephalopathy and renal failure.  Positive Klebsiella ESBL on last urine culture Continue antibiotic therapy with meropenem. (note that patient has received 7 days of treatment during last hospitalization).   Mild elevation of liver enzymes, possible due to hypoperfusion.  Acute on chronic diastolic CHF (congestive heart failure) ([place]) Doubt patient has decompensated heart failure, likely edema is due to poor oncotic pressure. Considering clinically hypovolemic and hypernatremic, will hold on further diuretic therapy for now. Close volume status monitoring.   Echocardiogram from 12/2021 with preserved LV systolic function but mild reduction in RV systolic function.      Acute respiratory failure with hypoxia (HCC) Right pleural effusion.   Currently on 2 L per min of supplemental 02 per [place] with 02 saturation greater than 92%.  Plan to continue close oxymetry monitoring, if worsening hypoxemia may need thoracentesis.   Alzheimer's disease [facility]) Patient  very deconditioned Per her son at the bedside her mentation is improving.  Continue neuro checks per unit protocol, nutrition consult, PT and OT.    Hypertension Continue blood pressure monitoring.  Continue atenolol for blood pressure control.   Dyslipidemia, continue statin therapy.   Permanent atrial fibrillation (HCC) Continue atenolol for rate control and rivaroxaban for anticoagulation.  Continue telemetry monitoring.         Subjective: Patient is more awake and alert, per her son at the bedside. No chest pain or dyspnea   Physical Exam: Vitals:   06/17/22 0630 06/17/22 0730 06/17/22 0800 06/17/22 0830  BP: 117/72 109/68 126/69 118/62  Pulse: 92 93 (!) 108 98  Resp: (!) 25 (!) 23 (!) 25 (!) 31  Temp:      TempSrc:      SpO2: 95% 95% 96% 95%   Neurology awake and alert, responds to simple questions and follows simple command.  Eating with the assistance of her son ENT with mild pallor. Dry mucous membranes Cardiovascular with S1 and S2 present and rhythmic with no gallops, rubs or murmurs No JVD No lower extremity edema, trace edema at the dependent zones Respiratory with no rales or wheezing, with mild decreased on the right side, anterior auscultation  Abdomen mild distended but not tender   Data Reviewed:    Family Communication: I spoke with patient's son at the bedside, we talked in detail about patient's condition, plan of care and prognosis and all questions were addressed.   Disposition: Status is: Inpatient Remains inpatient appropriate because: IV fluids, neuro checks   Planned Discharge Destination: Skilled nursing facility    Author: Tawni Millers, MD 06/17/2022 9:17 AM  For on call review [email or web address].

## 2022-06-17 NOTE — Evaluation (Signed)
Clinical/Bedside Swallow Evaluation Patient Details  Name: [name] Dyer MRN: [id number] Date of Birth: 02-Jul-1932  Today's Date: 06/17/2022 Time: SLP Start Time (ACUTE ONLY): 1004 SLP Stop Time (ACUTE ONLY): 1026 SLP Time Calculation (min) (ACUTE ONLY): 22 min  Past Medical History:  Past Medical History:  Diagnosis Date   Alzheimer disease ([place])    Anemia    Arrhythmia    Cataract    Colon polyps    Community acquired pneumonia 01/2012   LLL   Congestive heart failure ([place])    Hyperlipidemia    Hypertension    Memory difficulty 10/01/2016   Osteoporosis    Permanent atrial fibrillation (HCC)    Pleural effusion, right 01/2012   SVT (supraventricular tachycardia) ([place]) 01/2012   UTI (lower urinary tract infection)    Past Surgical History:  Past Surgical History:  Procedure Laterality Date   [place]   COLONOSCOPY     COLONOSCOPY N/A 06/10/2016   Procedure: COLONOSCOPY;  Surgeon: Manus Gunning, MD;  Location: [facility] ENDOSCOPY;  Service: Gastroenterology;  Laterality: N/A;   ESOPHAGOGASTRODUODENOSCOPY N/A 06/10/2016   Procedure: ESOPHAGOGASTRODUODENOSCOPY (EGD);  Surgeon: Manus Gunning, MD;  Location: [facility];  Service: Gastroenterology;  Laterality: N/A;   HEMIARTHROPLASTY HIP  2011   Left femoral neck fracture   RIGHT/LEFT HEART CATH AND CORONARY ANGIOGRAPHY N/A 01/08/2022   Procedure: RIGHT/LEFT HEART CATH AND CORONARY ANGIOGRAPHY;  Surgeon: Leonie Man, MD;  Location: [facility];  Service: Cardiovascular;  Laterality: N/A;   UPPER GASTROINTESTINAL ENDOSCOPY     HPI:  [name] Dyer is an 88 yo female who was admitted to the hospital with the working diagnosis of acute metabolic encephalopathy. Pt with the past medical history of atrial fibrillation, dementia, chronic heart failure, CKD stage 3b, recent hospitalization for ESBL Klebsiella urine infection (05/29/22 to 06/09/22). Patient was  discharged to SNF with severe deconditioning. Post discharge patient had very poor oral intake, increase 02 requirements for 2 to 3 days and fever the day prior to rehospitalization.  CXR 6/19: "1. Cardiomegaly with vascular congestion bilateral pleural  effusions. Airspace disease at the bases could be due to atelectasis  or pneumonia  2. Hazy asymmetric opacity right thorax could be due to pleural  effusion versus asymmetric edema"    Assessment / Plan / Recommendation  Clinical Impression  Pt presents with a mild oral dysphagia which appears to be cognitive in etiology.  Pt is well known to this service from prior admission with most recent MBS at [facility] on 6/5 revealing "a moderately impaired oral phase of swallow and mildly impaired pharyngeal phase of swallow" with delayed swallow initiation but no penetration or aspiration.  Pt seen with supportive son in room for evalution who reports significant improvement in mentation.  Pt was alert with eyes open for session. Pt follwing direction with occasionaly tactile cuing needed.  Pt was able to siphon thin liquid from straw and exhibited good tolerance of thin liquid by single straw sip.  Pt with excellent clearance of puree.  Pt was unable to bite regular solid graham cracker and did not accept small piece orally.  With soft solid cracker on spoon with apple sauce, pt exhibited prompt oral response when spoon placed in mouth.  There was mild oral residue L>R after swallow which was reduced with sip of thin liquid.  Simulated ground consistency did not decrease amount of oral residuals.  Pt exhibited infrequent throat clear during today's  session.  Pt was noted to cough/throat clear in absence of penetration or aspiration on MBS completed in January of this year.  Intermittent belching noted with PO intake.  Discussed diet preference with son who would like to be able to offer more appetizing food and would like to advance to thin liquid to decrease risk of  dehydration. Son was able to verbalize strategy (empty spoon) to help prompt swallow initiation if oral holding occurs.  Provided brief education and demonstration of hand under hand feeding.  Pt was consuming a mechanical soft diet as recently as June 2 and clinical swallow assessment in April was relatively unremarkable.    Recommend mechanical soft diet with thin liquids with 1:1 feeding assistance and compensations as noted below.  Alternate solids and liquids as needed to help clear any oral residuals.  SLP Visit Diagnosis: Dysphagia, oropharyngeal phase (R13.12)    Aspiration Risk  Mild aspiration risk    Diet Recommendation Dysphagia 3 (Mech soft);Thin liquid   Liquid Administration via: Straw  Medication Administration:   As tolerated - crush if pt is unable to transit pills or attempts to masticate medication.  Supervision:  Staff to assist 1:1 with self feeding; Full supervision/cueing for compensatory strategies; Trained caregiver to feed pt  Compensations:  Minimize environmental distractions; Slow rate; Small sips/bites; Follow solids with liquid; Pinch and remove straw if needed for single sips;  Introduce empty spoon if pt exhibits oral holding  Postural Changes: Seated upright at 90 degrees    Other  Recommendations Oral Care Recommendations: Oral care BID    Recommendations for follow up therapy are one component of a multi-disciplinary discharge planning process, led by the attending physician.  Recommendations may be updated based on patient status, additional functional criteria and insurance authorization.  Follow up Recommendations Skilled nursing-short term rehab (<3 hours/day)      Assistance Recommended at Discharge Frequent or constant Supervision/Assistance  Functional Status Assessment Patient has had a recent decline in their functional status and demonstrates the ability to make significant improvements in function in a reasonable and predictable  amount of time.  Frequency and Duration min 2x/week  2 weeks       Prognosis        Swallow Study   General Date of Onset: 06/14/2022 HPI: [name] Dyer is an 88 yo female who was admitted to the hospital with the working diagnosis of acute metabolic encephalopathy. Pt with the past medical history of atrial fibrillation, dementia, chronic heart failure, CKD stage 3b, recent hospitalization for ESBL Klebsiella urine infection (05/29/22 to 06/09/22). Patient was discharged to SNF with severe deconditioning. Post discharge patient had very poor oral intake, increase 02 requirements for 2 to 3 days and fever the day prior to rehospitalization.  CXR 6/19: "1. Cardiomegaly with vascular congestion bilateral pleural  effusions. Airspace disease at the bases could be due to atelectasis  or pneumonia  2. Hazy asymmetric opacity right thorax could be due to pleural  effusion versus asymmetric edema" Type of Study: Bedside Swallow Evaluation Previous Swallow Assessment: MBS 6/5 at [facility] Diet Prior to this Study: Nectar-thick liquids;Dysphagia 1 (puree) Respiratory Status:  ([place]) History of Recent Intubation: No Behavior/Cognition: Alert;Cooperative;Requires cueing Oral Care Completed by SLP: No Oral Cavity - Dentition: Adequate natural dentition Self-Feeding Abilities: Total assist Patient Positioning: Partially reclined Baseline Vocal Quality: Not observed Volitional Cough: Cognitively unable to elicit Volitional Swallow: Unable to elicit    Oral/Motor/Sensory Function Overall Oral Motor/Sensory Function: Mild impairment Facial ROM: Within Functional  Limits Facial Symmetry: Within Functional Limits Lingual ROM:  (CNT) Lingual Symmetry: Within Functional Limits Velum:  (CNT) Mandible:  (Reduced excursion)   Ice Chips Ice chips: Not tested   Thin Liquid Thin Liquid: Within functional limits Presentation: Straw    Nectar Thick Nectar Thick Liquid: Not tested   Honey Thick Honey Thick Liquid:  Not tested   Puree Puree: Within functional limits Presentation: Spoon   Solid     Solid: Impaired Oral Phase Functional Implications: Oral residue      Celedonio Savage, MA, [place] Office: [phone] 06/17/2022,10:55 AM

## 2022-06-17 NOTE — Assessment & Plan Note (Addendum)
Right pleural effusion.   Stable oxygenation, today she is on 3 L/min per [place] with 02 saturation 100%

## 2022-06-17 NOTE — Assessment & Plan Note (Addendum)
Severe sepsis, present on admission, end organ .  Positive Klebsiella ESBL 06/01 Enterococcus faecalis on this admission 10,000 CFU.   Urine infection has been ruled out and antibiotic therapy has been discontinued.

## 2022-06-17 NOTE — Hospital Course (Addendum)
[name] was admitted to the hospital with the working diagnosis of acute metabolic encephalopathy.    88 yo female with the past medical history of atrial fibrillation, dementia, chronic heart failure, CKD stage 3b, recent hospitalization for ESBL Klebsiella urine infection (05/29/22 to 06/09/22). Patient was discharged to SNF with severe deconditioning. Post discharge patient had very poor oral intake, increase 02 requirements for 2 to 3 days and fever the day prior to rehospitalization.On her initial physical examination her blood pressure was 136/75, HR 117, RR 30 and 02 saturation 98%, lungs with rales on the right side, heart with S1 and S2 present and tachycardic with no gallops, or rubs, abdomen soft and not distended, positive bilateral lower extremity edema.    Na 157, K 3,7 CL 121, bicarbonate 28, glucose 116, bun 40 cr 1,37  AST 99 ALT 84 BNP 1,588 Wbc 11,2, hgb 12,1 plt clumped.  Sars covid 19 negative    Urine analysis with positive nitrate, wbc >50, SG 1,010 Urine Na 132.   Chest radiograph with cardiomegaly and moderate right pleural effusion, small left pleural effusion.    EKG 111 bpm, right axis deviation, right bundle branch block, atrial fibrillation rhythm with no significant ST segment or T wave changes.    Patient was placed on antibiotic therapy and IV fluids.   Infection ruled out; treated for marked dehydration secondary to failure to thrive secondary to advanced dementia with clinical improvement and improvement in mentation.   Hospitalization prolonged by mild volume overload nearly resolved.   Main issue now is advanced dementia, failure to thrive and poor oral intake.  Calorie counting in process.  Plan to return to SNF.    07/01 atrial fibrillation with RVR.  I spoke with her son about advance directives, for now continue full code.   07/03 patient with progressive dementia, likely at her new baseline. I spoke with her son this am, and both have  decided to change code status to DNR. Patient no longer qualifies to SNF for PT, but she does qualify for residential hospice.  Will talk to the family about hospice option.

## 2022-06-17 NOTE — Assessment & Plan Note (Addendum)
Hypernatremia. Hypokalemia. Hyperkalemia.  Very poor oral intake, her renal function toady has worsening cr at 1,2 with K at 5,0 and serum bicarbonate at 29.   Will add gentle IV fluids for today and follow up renal function in am.  Patient with progressive and advance dementia, poor oral intake, high risk for renal failure, dehydration and electrolyte abnormalities.   Considering her prognosis, she will not benefit from tube feedings that likely in her case carries a high risk for complications, including aspiration pneumonia, and has the potential of prolonging patient's suffering.

## 2022-06-17 NOTE — Assessment & Plan Note (Addendum)
Acute metabolic encephalopathy with delirium, likely multifactorial, dehydration, hypoxemia, hypernatremia and poor nutrition.  Progressive dementia, worsening over last 6 months, poor prognosis. Likely patient has reached a new baseline. Continue with very poor oral intake and not much interactive.   I spoke with her son at the bedside and family has decided to change code status to DNR. She no longer qualifies for SNF for rehab, I spoke did peer to peer review with Market researcher from insurance.   Considering her poor prognosis and rapidly progressive dementia, patient will qualify for residential hospice.  Will check if family about this option.

## 2022-06-17 NOTE — ED Notes (Signed)
Attempted in and out catheter.  Unable to obtain urine sample.  Patient noted to be soiled.  Cleaned with soap and water.  New adult brief and pad placed.  Patient repositioned in bed

## 2022-06-17 NOTE — Assessment & Plan Note (Addendum)
Patient very deconditioned Advanced dementia.

## 2022-06-18 DIAGNOSIS — G9340 Encephalopathy, unspecified: Secondary | ICD-10-CM | POA: Diagnosis not present

## 2022-06-18 DIAGNOSIS — L8990 Pressure ulcer of unspecified site, unspecified stage: Secondary | ICD-10-CM | POA: Diagnosis present

## 2022-06-18 DIAGNOSIS — N179 Acute kidney failure, unspecified: Secondary | ICD-10-CM | POA: Diagnosis not present

## 2022-06-18 DIAGNOSIS — R7401 Elevation of levels of liver transaminase levels: Secondary | ICD-10-CM | POA: Diagnosis present

## 2022-06-18 DIAGNOSIS — J9601 Acute respiratory failure with hypoxia: Secondary | ICD-10-CM | POA: Diagnosis not present

## 2022-06-18 DIAGNOSIS — I5033 Acute on chronic diastolic (congestive) heart failure: Secondary | ICD-10-CM | POA: Diagnosis not present

## 2022-06-18 LAB — CULTURE, BLOOD (ROUTINE X 2): Special Requests: ADEQUATE

## 2022-06-18 LAB — CBC
HCT: 40 % (ref 36.0–46.0)
Hemoglobin: 12.2 g/dL (ref 12.0–15.0)
MCH: 28.4 pg (ref 26.0–34.0)
MCHC: 30.5 g/dL (ref 30.0–36.0)
MCV: 93.2 fL (ref 80.0–100.0)
Platelets: UNDETERMINED 10*3/uL (ref 150–400)
RBC: 4.29 MIL/uL (ref 3.87–5.11)
RDW: 18.7 % — ABNORMAL HIGH (ref 11.5–15.5)
WBC: 10.6 10*3/uL — ABNORMAL HIGH (ref 4.0–10.5)
nRBC: 0.2 % (ref 0.0–0.2)

## 2022-06-18 LAB — COMPREHENSIVE METABOLIC PANEL
ALT: 64 U/L — ABNORMAL HIGH (ref 0–44)
AST: 58 U/L — ABNORMAL HIGH (ref 15–41)
Albumin: 2.1 g/dL — ABNORMAL LOW (ref 3.5–5.0)
Alkaline Phosphatase: 94 U/L (ref 38–126)
Anion gap: 9 (ref 5–15)
BUN: 57 mg/dL — ABNORMAL HIGH (ref 8–23)
CO2: 25 mmol/L (ref 22–32)
Calcium: 7.8 mg/dL — ABNORMAL LOW (ref 8.9–10.3)
Chloride: 114 mmol/L — ABNORMAL HIGH (ref 98–111)
Creatinine, Ser: 1.58 mg/dL — ABNORMAL HIGH (ref 0.44–1.00)
GFR, Estimated: 31 mL/min — ABNORMAL LOW (ref >60)
Glucose, Bld: 115 mg/dL — ABNORMAL HIGH (ref 70–99)
Potassium: 4.2 mmol/L (ref 3.5–5.1)
Sodium: 148 mmol/L — ABNORMAL HIGH (ref 135–145)
Total Bilirubin: 1 mg/dL (ref 0.3–1.2)
Total Protein: 5.4 g/dL — ABNORMAL LOW (ref 6.5–8.1)

## 2022-06-18 LAB — UREA NITROGEN, URINE: Urea Nitrogen, Ur: 298 mg/dL

## 2022-06-18 MED ORDER — ENSURE ENLIVE PO LIQD
237.0000 mL | Freq: Three times a day (TID) | ORAL | Status: DC
  Administered 2022-06-18 – 2022-07-06 (×40): 237 mL via ORAL
  Filled 2022-06-18: qty 237

## 2022-06-18 MED ORDER — ALLOPURINOL 100 MG PO TABS
100.0000 mg | ORAL_TABLET | Freq: Every day | ORAL | Status: DC
Start: 2022-06-18 — End: 2022-07-06
  Administered 2022-06-18 – 2022-07-05 (×18): 100 mg via ORAL
  Filled 2022-06-18 (×18): qty 1

## 2022-06-18 MED ORDER — MEDIHONEY WOUND/BURN DRESSING EX PSTE
1.0000 | PASTE | Freq: Every day | CUTANEOUS | Status: DC
  Administered 2022-06-18 – 2022-07-05 (×18): 1 via TOPICAL
  Filled 2022-06-18 (×3): qty 44

## 2022-06-18 MED ORDER — ADULT MULTIVITAMIN W/MINERALS CH
1.0000 | ORAL_TABLET | Freq: Every day | ORAL | Status: DC
  Administered 2022-06-18 – 2022-07-05 (×18): 1 via ORAL
  Filled 2022-06-18 (×18): qty 1

## 2022-06-18 MED ORDER — HYDROCERIN EX CREA
TOPICAL_CREAM | Freq: Two times a day (BID) | CUTANEOUS | Status: DC
  Administered 2022-06-22 – 2022-07-07 (×6): 1 via TOPICAL
  Filled 2022-06-18 (×2): qty 113

## 2022-06-18 NOTE — Evaluation (Signed)
Occupational Therapy Evaluation Patient Details Name: [name] Dyer MRN: [id number] DOB: 06-08-1932 Today's Date: 06/18/2022   History of Present Illness Pt is an 88 y/o female admitted from SNF secondary to sepsis from UTI and acute metabolic encephalopathy. PMH includes dementia, CHF, a fib, HTN, CKD, and CHF.   Clinical Impression   88 yo female with admission from SNF. Son at bedside providing hx given pt baseline dementia. He reports prior to hospitalization at [facility] pt was walking with some assist to and from the bathroom and feeding herself with set-up. Pt received eating lunch with total A from SLP. Per son's report pt is talking more than she previously was. She is not moving UE to engage in self feeding but does show some active movement when OT facilitating. She appears to have pain and UE and LE with movement. She will answer simple questions related to her comfort (do you need a blanket?). Son is concerned she will likely not make additional progress and does not feel that he can assist her if she is d/c home. Discussed progress made since admission and plan to trial acute therapy with recommendation for d/c to SNF. Son in agreement.       Recommendations for follow up therapy are one component of a multi-disciplinary discharge planning process, led by the attending physician.  Recommendations may be updated based on patient status, additional functional criteria and insurance authorization.   Follow Up Recommendations  Skilled nursing-short term rehab (<3 hours/day)    Assistance Recommended at Discharge Frequent or constant Supervision/Assistance  Patient can return home with the following Two people to help with walking and/or transfers;Two people to help with bathing/dressing/bathroom;Assistance with feeding;Assist for transportation;Direct supervision/assist for medications management       Equipment Recommendations  None recommended by OT       Precautions /  Restrictions Precautions Precautions: Fall Precaution Comments: advanced dementia      Mobility Bed Mobility Overal bed mobility: Needs Assistance Bed Mobility: Rolling Rolling: Total assist, +2 for physical assistance                    ADL either performed or assessed with clinical judgement   ADL Overall ADL's : Needs assistance/impaired Eating/Feeding: Total assistance   Grooming: Total assistance   Upper Body Bathing: Total assistance   Lower Body Bathing: Total assistance   Upper Body Dressing : Total assistance   Lower Body Dressing: Total assistance   Toilet Transfer: Total assistance   Toileting- Clothing Manipulation and Hygiene: Total assistance       Functional mobility during ADLs: Total assistance;Maximal assistance       Vision Patient Visual Report: Other (comment) (pt unable to shift visual focus to identified target, staring off) Vision Assessment?: Vision impaired- to be further tested in functional context            Pertinent Vitals/Pain Pain Assessment Pain Assessment: Faces Faces Pain Scale: Hurts even more Pain Location: generalized Pain Intervention(s): Limited activity within patient's tolerance, Repositioned     Hand Dominance Right   Extremity/Trunk Assessment Upper Extremity Assessment Upper Extremity Assessment: RUE deficits/detail;LUE deficits/detail;Generalized weakness RUE Deficits / Details: limited tolerance for ROM, note edema to R hand and unable to fully open, allowing OT to flex elbow to approx 90 degrees and shoulder to approx 70 degrees but grimacing with all movement, elevated on pillow with goal of addressing edema and increasing active movement LUE Deficits / Details: pt attempting active movement but appears limited  due to pain, allowing OT to flex shoulder to 90 degrees, flex elbow to 90 degrees and demonstrating some active movement with gross grasp   Lower Extremity Assessment Lower Extremity  Assessment: RLE deficits/detail;LLE deficits/detail RLE Deficits / Details: gentle PROM performed to tolerance, limiting hip and knee flexion, pain appears greater in RLE as compared to LLE LLE Deficits / Details: PROM performed to tolerance, limiting knee and hip flexion       Communication Communication Communication: Expressive difficulties   Cognition Arousal/Alertness: Awake/alert Behavior During Therapy: Flat affect Overall Cognitive Status: Impaired/Different from baseline Area of Impairment: Attention, Awareness, Safety/judgement, Following commands                 General Comments  RUE edema>LUE, painful with all movement, was able to talk some            Home Living Family/patient expects to be discharged to:: Skilled nursing facility                      Prior Functioning/Environment Prior Level of Function : Needs assist             Mobility Comments: Required assist for mobility tasks ADLs Comments: Required assist from staff for ADLs.        OT Problem List: Decreased strength;Decreased cognition;Impaired balance (sitting and/or standing);Decreased range of motion;Decreased activity tolerance;Pain;Impaired UE functional use      OT Treatment/Interventions: Self-care/ADL training;Therapeutic exercise;Therapeutic activities;Patient/family education    OT Goals(Current goals can be found in the care plan section) Acute Rehab OT Goals Patient Stated Goal: Son would like his mother to be more alert and interactive OT Goal Formulation: With family Time For Goal Achievement: 07/02/22 Potential to Achieve Goals: Poor ADL Goals Pt Will Perform Eating: with min assist;with adaptive utensils Pt Will Perform Grooming: with min assist;bed level  OT Frequency: Min 2X/week       AM-PAC OT "6 Clicks" Daily Activity     Outcome Measure Help from another person eating meals?: Total Help from another person taking care of personal grooming?:  Total Help from another person toileting, which includes using toliet, bedpan, or urinal?: Total Help from another person bathing (including washing, rinsing, drying)?: Total Help from another person to put on and taking off regular upper body clothing?: Total Help from another person to put on and taking off regular lower body clothing?: Total 6 Click Score: 6   End of Session    Activity Tolerance: Patient limited by fatigue;Patient limited by pain Patient left: in bed;with bed alarm set;with family/visitor present  OT Visit Diagnosis: Other abnormalities of gait and mobility (R26.89);Muscle weakness (generalized) (M62.81);Other symptoms and signs involving cognitive function;Pain Pain - part of body: Arm;Leg                Time: 6147-0929 OT Time Calculation (min): 29 min Charges:  OT General Charges $OT Visit: 1 Visit OT Evaluation $OT Eval Moderate Complexity: 1 Mod    Naomie Dean Maddix Heinz, OTR/L 06/18/2022, 1:01 PM

## 2022-06-18 NOTE — Plan of Care (Signed)
  Problem: Fluid Volume: Goal: Hemodynamic stability will improve Outcome: Progressing   Problem: Clinical Measurements: Goal: Diagnostic test results will improve Outcome: Progressing Goal: Signs and symptoms of infection will decrease Outcome: Progressing   Problem: Education: Goal: Knowledge of General Education information will improve Description: Including pain rating scale, medication(s)/side effects and non-pharmacologic comfort measures Outcome: Progressing

## 2022-06-18 NOTE — TOC Initial Note (Signed)
Transition of Care [facility]) - Initial/Assessment Note    Patient Details  Name: [name] Dyer MRN: [id number] Date of Birth: 11/08/1932  Transition of [facility]) CM/SW Contact:    Bethann Berkshire, [place] Phone Number: 06/18/2022, 1:53 PM  Clinical Narrative:                  Pt currently disoriented CSW met with pt's son [name] and pt's pastor bedside. CSW explained his involvement in DC planning and PT rec for STR at SNF. Son states that pt did not do well at [facility] and that family were not very satisfied with pt's care there. They would like to consider different SNF options. Son is open to exploring SNF's beyond [facility] area, especially [place]. CSW explains he will begin SNF workup and follow up with SNF offers. [name] informs CSW that he normally comes to visit during the day and pt's other son, [name], visits in the evenings. CSW will complete fl2 and fax bed requests in hub.    Expected Discharge Plan: Skilled Nursing Facility Barriers to Discharge: Continued Medical Work up, Ship broker, SNF Pending bed offer   Patient Goals and CMS Choice        Expected Discharge Plan and Services Expected Discharge Plan: [place] arrangements for the past 2 months: [place], [place]                                      Prior Living Arrangements/Services Living arrangements for the past 2 months: [place], [place] Lives with:: Facility Resident, Adult Children                   Activities of Daily Living      Permission Sought/Granted                  Emotional Assessment         Alcohol / Substance Use: Not Applicable Psych Involvement: No (comment)  Admission diagnosis:  Hypoxemia [R09.02] Hypernatremia [E87.0] Acute pulmonary edema (HCC) [J81.0] Acute encephalopathy [G93.40] AKI (acute kidney injury) ([place]) [N17.9] Sepsis  ([place]) [A41.9] Patient Active Problem List   Diagnosis Date Noted   Pressure injury of skin 06/18/2022   Elevated transaminase level 06/18/2022   Sepsis ([place]) 06/17/2022   Acute encephalopathy 06/19/2022   Acute respiratory failure with hypoxia ([place]) 06/15/2022   Sepsis due to gram-negative UTI ([place]) 06/20/2022   Hypernatremia    Acute CVA (cerebrovascular accident) ([place]) 04/22/2022   CVA (cerebral vascular accident) ([place]) 04/22/2022   Urinary tract infection due to ESBL Klebsiella 04/22/2022   Acute renal failure superimposed on stage 3b chronic kidney disease ([place]) 04/22/2022   Hypertensive heart and kidney disease with chronic diastolic congestive heart failure and stage 4 chronic kidney disease ([place]) 04/20/2022   Dyspnea on minimal exertion 01/08/2022   Atypical angina ([place]) 01/08/2022   Neuropathy 07/17/2021   Hand joint pain 12/24/2020   Chronic dermatitis 11/24/2020   Acute cystitis 05/22/2020   Chronic back pain 05/22/2020   Cystocele without uterine prolapse 05/22/2020   Pain of toe of left foot 04/26/2020   CHF (congestive heart failure) ([place]) 01/30/2020   Chronic rhinitis 11/09/2019   Facial pain syndrome 11/09/2019   Rash 11/09/2019   Temporal arteritis ([place]) 11/09/2019   Chronic anticoagulation 06/24/2018  Peripheral neuropathy 06/24/2018   Alzheimer's disease ([place]) 04/08/2018   Insomnia 04/08/2018   Osteoporosis 04/08/2018   Memory difficulty 10/01/2016   Colitis presumed infectious 06/06/2016   Acute on chronic diastolic CHF (congestive heart failure) ([place]) 06/06/2016   Permanent atrial fibrillation ([place]) 06/06/2016   Hypertension 06/06/2016   Hyperlipidemia 06/06/2016   Diarrhea 06/04/2016   Anemia 04/27/2012   PCP:  [name], NP Pharmacy:   [facility] [place], [place] [address] Phone: [phone] Fax: [place], [name] [address] Phone: [phone] Fax: [phone]     Social Determinants of Health (SDOH) Interventions    Readmission Risk Interventions    05/31/2022    5:13 PM  Readmission Risk Prevention Plan  Transportation Screening Complete  Medication Review (RN Care Manager) Referral to Pharmacy  PCP or Specialist appointment within 3-5 days of discharge Complete  HRI or Home Care Consult Complete  SW Recovery Care/Counseling Consult Complete  [place] Complete

## 2022-06-18 NOTE — Consult Note (Addendum)
WOC Nurse Consult Note: Reason for Consult: Consult requested for sacrum Wound type: Unstageable wound to sacrum, 6X5.5X.2cm, appears to be previous Deep tissue pressure injury which has evolved.  80% red, 20% black inner wound bed with loose peeling skin to wound edges.  Mod amt tan drainage, no odor or fluctuance.  Pt is incontinent of urine and stool and it is difficult to keep area from becoming soiled since the wound is in close proximity to the rectum. Pressure Injury POA: Yes Dressing procedure/placement/frequency: Topical treatment orders provided for bedside nurses to perform as follows to assist with removal of nonviable tissue: Apply Medihoney to sacrum wound Q day, then cover with gauze and foam dressing. Please re-consult if further assistance is needed.  Thank-you,  Julien Girt MSN, [place], [place], [place], [place]

## 2022-06-18 NOTE — NC FL2 (Signed)
[place] LEVEL OF CARE SCREENING TOOL     IDENTIFICATION  Patient Name: [name] Dyer Birthdate: 1932/05/04 Sex: female Admission Date (Current Location): 06/14/2022  [facility] and [place] Number:  Herbalist and Address:  [facility] [place] [address] [place], [place] [address]      Provider Number: [id number]  Attending Physician Name and Address:  Samuella Cota, MD  Relative Name and Phone Number:  [place], [place] [phone] Mes[place] Jr,[name] Son [phone]    Current Level of Care: Hospital Recommended Level of Care: [place] Prior Approval Number:    Date Approved/Denied:   PASRR Number: [id number]  Discharge Plan: SNF    Current Diagnoses: Patient Active Problem List   Diagnosis Date Noted   Pressure injury of skin 06/18/2022   Elevated transaminase level 06/18/2022   Sepsis ([place]) 06/17/2022   Acute encephalopathy 06/05/2022   Acute respiratory failure with hypoxia ([place]) 06/22/2022   Sepsis due to gram-negative UTI ([place]) 06/06/2022   Hypernatremia    Acute CVA (cerebrovascular accident) ([place]) 04/22/2022   CVA (cerebral vascular accident) ([place]) 04/22/2022   Urinary tract infection due to ESBL Klebsiella 04/22/2022   Acute renal failure superimposed on stage 3b chronic kidney disease ([place]) 04/22/2022   Hypertensive heart and kidney disease with chronic diastolic congestive heart failure and stage 4 chronic kidney disease ([place]) 04/20/2022   Dyspnea on minimal exertion 01/08/2022   Atypical angina (HCC) 01/08/2022   Neuropathy 07/17/2021   Hand joint pain 12/24/2020   Chronic dermatitis 11/24/2020   Acute cystitis 05/22/2020   Chronic back pain 05/22/2020   Cystocele without uterine prolapse 05/22/2020   Pain of toe of left foot 04/26/2020   CHF (congestive heart failure) (HCC) 01/30/2020   Chronic rhinitis 11/09/2019   Facial pain syndrome 11/09/2019    Rash 11/09/2019   Temporal arteritis ([place]) 11/09/2019   Chronic anticoagulation 06/24/2018   Peripheral neuropathy 06/24/2018   Alzheimer's disease ([place]) 04/08/2018   Insomnia 04/08/2018   Osteoporosis 04/08/2018   Memory difficulty 10/01/2016   Colitis presumed infectious 06/06/2016   Acute on chronic diastolic CHF (congestive heart failure) ([place]) 06/06/2016   Permanent atrial fibrillation ([place]) 06/06/2016   Hypertension 06/06/2016   Hyperlipidemia 06/06/2016   Diarrhea 06/04/2016   Anemia 04/27/2012    Orientation RESPIRATION BLADDER Height & Weight        O2 (2L) Incontinent, External catheter Weight: 134 lb 11.2 oz (61.1 kg) Height:  _0  (154.9 cm)  BEHAVIORAL SYMPTOMS/MOOD NEUROLOGICAL BOWEL NUTRITION STATUS      Incontinent Diet (See d/c summary)  AMBULATORY STATUS COMMUNICATION OF NEEDS Skin   Extensive Assist Verbally  (Pressure injury coccyx, mid, lower, unstageable)                       Personal Care Assistance Level of Assistance  Bathing, Feeding, Dressing Bathing Assistance: Maximum assistance Feeding assistance: Limited assistance Dressing Assistance: Maximum assistance     Functional Limitations Info  Sight, Hearing, Speech Sight Info: Impaired Hearing Info: Impaired Speech Info: Adequate    SPECIAL CARE FACTORS FREQUENCY  OT (By licensed OT), PT (By licensed PT)     PT Frequency: 5x/week OT Frequency: 5x/week            Contractures Contractures Info: Not present    Additional Factors Info  Code Status, Allergies, Isolation Precautions Code Status Info: Full code Allergies Info: Ciprofloxacin Sulfa Antibiotics Nitrofurantoin Memantine Succinylsulphathiazole Sulfamethoxazole  Isolation Precautions Info: ESBL     Current Medications (06/18/2022):  This is the current hospital active medication list Current Facility-Administered Medications  Medication Dose Route Frequency Provider Last Rate Last Admin   acetaminophen  (TYLENOL) tablet 650 mg  650 mg Oral Q6H PRN Opyd, Ilene Qua, MD   650 mg at 06/17/22 8727   Or   acetaminophen (TYLENOL) suppository 650 mg  650 mg Rectal Q6H PRN Opyd, Ilene Qua, MD       atenolol (TENORMIN) tablet 50 mg  50 mg Oral Daily Opyd, Ilene Qua, MD   50 mg at 06/18/22 1015   dextrose 5 % solution   Intravenous Continuous Arrien, Jimmy Picket, MD 50 mL/hr at 06/18/22 0610 New Bag at 06/18/22 0610   donepezil (ARICEPT) tablet 10 mg  10 mg Oral QHS Opyd, Ilene Qua, MD   10 mg at 06/17/22 2132   feeding supplement (ENSURE ENLIVE / ENSURE PLUS) liquid 237 mL  237 mL Oral BID BM Arrien, Jimmy Picket, MD   237 mL at 06/18/22 1014   hydrocerin (EUCERIN) cream   Topical BID Howerter, Justin B, DO   Given at 06/18/22 0802   leptospermum manuka honey ([place]) paste 1 Application  1 Application Topical Daily Samuella Cota, MD   1 Application at 61/84/85 1015   memantine (NAMENDA) tablet 5 mg  5 mg Oral BID Opyd, Ilene Qua, MD   5 mg at 06/18/22 0802   meropenem (MERREM) 1 g in sodium chloride 0.9 % 100 mL IVPB  1 g Intravenous Q12H Bertis Ruddy, RPH 200 mL/hr at 06/18/22 1034 1 g at 06/18/22 1034   ondansetron (ZOFRAN) tablet 4 mg  4 mg Oral Q6H PRN Opyd, Ilene Qua, MD       Or   ondansetron (ZOFRAN) injection 4 mg  4 mg Intravenous Q6H PRN Opyd, Ilene Qua, MD       pantoprazole (PROTONIX) EC tablet 40 mg  40 mg Oral Daily Opyd, Ilene Qua, MD   40 mg at 06/18/22 9276   Rivaroxaban (XARELTO) tablet 15 mg  15 mg Oral Q supper Opyd, Ilene Qua, MD   15 mg at 06/17/22 1628   rosuvastatin (CRESTOR) tablet 5 mg  5 mg Oral Daily Opyd, Ilene Qua, MD   5 mg at 06/18/22 0802   sodium chloride flush (NS) 0.9 % injection 3 mL  3 mL Intravenous Q12H Opyd, Ilene Qua, MD   3 mL at 06/17/22 2145   traZODone (DESYREL) tablet 50 mg  50 mg Oral QHS PRN Opyd, Ilene Qua, MD         Discharge Medications: Please see discharge summary for a list of discharge medications.  Relevant Imaging  Results:  Relevant Lab Results:   Additional Information SS# [id number]  Bethann Berkshire, LCSW

## 2022-06-18 NOTE — Progress Notes (Signed)
  Progress Note   Patient: [name] Dyer LKJ:[id number] DOB: 04-08-1932 DOA: 06/21/2022     2 DOS: the patient was seen and examined on 06/18/2022   Brief hospital course: 88 year old woman presented with fever and lethargy, admitted for acute metabolic encephalopathy, acute hypoxia, AKI, SIRS.   Assessment and Plan: * Acute encephalopathy -- Multifactorial including dehydration, hypoxia, hypernatremia. Doubt UTI.  Acute renal failure superimposed on stage 3b chronic kidney disease (HCC) --baseline ~1.1; BUN chronically elevated --secondary to FTT --no improvement --IVF, check CMP in AM  Acute respiratory failure with hypoxia (HCC) --etiology unclear; right pleural effusion is small --follow; wean oxygen as tolerated  Hypovolemic hypernatremia. --slowly improving; secondary to FTT at facility; was not eating --continue IVF, check CMP in AM  Elevated transaminase level --trending down; possibly secondary to CHF  Sepsis considered but BC contaminant and urine unimpressive --suspect will r/o sepsis tomorrow --stop Merrem; urine w/ enterococcus; doubt pathogenic  Chronic diastolic CHF ([place]) --follow  Alzheimer's disease ([place]) --stable  Permanent atrial fibrillation ([place]) --continue atenolol for rate control and rivaroxaban for anticoagulation.      Subjective:  Feels ok Per RN has not voided, had <300 earlier in bladder on scan Ate about 25% breakfast, took some bites of lunch Has stage III Apparently slept a week at nursing home  Physical Exam: Vitals:   06/18/22 1015 06/18/22 1100 06/18/22 1154 06/18/22 1600  BP: 108/60  107/62 108/81  Pulse: (!) 102 83 88 96  Resp:   20 (!) 28  Temp:   97.9 F (36.6 C)   TempSrc:      SpO2: 99% 100% 98% 100%  Weight:      Height:       Physical Exam Vitals reviewed.  Constitutional:      General: She is not in acute distress.    Appearance: She is not ill-appearing or toxic-appearing.  Cardiovascular:     Rate and  Rhythm: Normal rate and regular rhythm.     Heart sounds: No murmur heard. Pulmonary:     Effort: Pulmonary effort is normal. No respiratory distress.     Breath sounds: No wheezing, rhonchi or rales.  Musculoskeletal:     Right lower leg: No edema.     Left lower leg: No edema.  Neurological:     Mental Status: She is alert. She is disoriented.  Psychiatric:        Mood and Affect: Mood normal.        Behavior: Behavior normal.     Data Reviewed:  UOP 800, x1 Na+ down to 148 BUN up to 57 Creatinine stable at 1.58 AST and ALT trending down WBC trending down 10.6  Family Communication:   Disposition: Status is: Inpatient Remains inpatient appropriate because: dehydration, poor oral intake  Planned Discharge Destination:  return to facility    Time spent: 35 minutes  Author: Murray Hodgkins, MD 06/18/2022 7:29 PM  For on call review [email or web address].

## 2022-06-18 NOTE — Progress Notes (Signed)
Initial Nutrition Assessment  DOCUMENTATION CODES:  Not applicable  INTERVENTION:  Continue current diet as ordered per SLP Ensure Enlive po TID, each supplement provides 350 kcal and 20 grams of protein. MVI with minerals daily Magic cup TID with meals, each supplement provides 290 kcal and 9 grams of protein  NUTRITION DIAGNOSIS:  Inadequate oral intake related to lethargy/confusion as evidenced by meal completion < 50%.  GOAL:  Patient will meet greater than or equal to 90% of their needs  MONITOR:  PO intake, Supplement acceptance, Weight trends, I & O's  REASON FOR ASSESSMENT:  Consult Assessment of nutrition requirement/status  ASSESSMENT:  Pt with hx of atrial fibrillation, alzheimer's dementia, CHF, and CKD3 presented to ED from nursing home with fever and lethargy. Found to be septic on admission  Pt resting in bed at the time of assessment. Does not respond or answer nutrition questions. No family present at bedside at this time. Lunch tray noted at bedside partially consumed. Noted that SLP was total assist with feeding as she is not able to lift her arms. Arms and hands are quite edematous. Will add nutrition supplements to augment intake.   Noted poor intake recorded prior this admission, pt is full code with full scope of care. If poor intake continues, may need a cortrak tube for enteral feeds. Some mild muscle and fat deficits noted which seem to be consistent with the normal aging process.  Average Meal Intake: 6/20-6/21: 10% intake x 3 recorded meals  Nutritionally Relevant Medications: Scheduled Meds:  Ensure Enlive   237 mL Oral BID BM   memantine  5 mg Oral BID   pantoprazole  40 mg Oral Daily   rosuvastatin  5 mg Oral Daily   Continuous Infusions:  dextrose 50 mL/hr at 06/18/22 0610   meropenem (MERREM) IV 1 g (06/18/22 1034)   PRN Meds: ondansetron  Labs Reviewed: Na 148, chloride 114 BUN 57, creatinine 1.58  NUTRITION - FOCUSED PHYSICAL  EXAM: Flowsheet Row Most Recent Value  Orbital Region Mild depletion  Upper Arm Region No depletion  Thoracic and Lumbar Region No depletion  Buccal Region No depletion  Temple Region Mild depletion  Clavicle Bone Region Mild depletion  Clavicle and Acromion Bone Region Mild depletion  Scapular Bone Region Unable to assess  Dorsal Hand No depletion  [swelling present]  Patellar Region No depletion  Anterior Thigh Region No depletion  Posterior Calf Region No depletion  Edema (RD Assessment) Moderate  [hands, BLE]  Hair Reviewed  Eyes Reviewed  Mouth Reviewed  Skin Reviewed  Nails Reviewed   Diet Order:   Diet Order             DIET DYS 3 Room service appropriate? Yes; Fluid consistency: Thin  Diet effective now                   EDUCATION NEEDS:  Not appropriate for education at this time  Skin:  Skin Assessment: Reviewed RN Assessment (scattered bruising, unstagable pressure injury to the coccyx)  Last BM:  6/20  Height:  Ht Readings from Last 1 Encounters:  06/17/22 _0  (1.549 m)    Weight:  Wt Readings from Last 1 Encounters:  06/18/22 61.1 kg    Ideal Body Weight:  47.7 kg  BMI:  Body mass index is 25.45 kg/m.  Estimated Nutritional Needs:  Kcal:  [address] kcal/d Protein:  70-85g/d Fluid:  1.5-1.6L/d   Ranell Patrick, RD, LDN Clinical Dietitian RD pager # available in  [facility]  After hours/weekend pager # available in [facility]

## 2022-06-18 NOTE — Progress Notes (Signed)
Speech Language Pathology Treatment: Dysphagia;Cognitive-Linquistic  Patient Details Name: PAW KARSTENS MRN: [id number] DOB: [birth year] Today's Date: 06/18/2022 Time: 7858-8502 SLP Time Calculation (min) (ACUTE ONLY): 21 min  Assessment / Plan / Recommendation Clinical Impression  Pt seen with chopped spaghetti, thin liquids on lunch tray. SLP provided total assisted feeding as pt unable to lift arms to feed herself. Son at bedside present for education on basic compensatory strategies - follow bites with small sips, upright posture, breaks during meals. Pt to continue current diet with f/u at next level of care. Will sign off acutely.   HPI HPI: Mrs. Lattner is an 88 yo female who was admitted to the hospital with the working diagnosis of acute metabolic encephalopathy. Pt with the past medical history of atrial fibrillation, dementia, chronic heart failure, CKD stage 3b, recent hospitalization for ESBL Klebsiella urine infection (05/29/22 to 06/09/22). Patient was discharged to SNF with severe deconditioning. Post discharge patient had very poor oral intake, increase 02 requirements for 2 to 3 days and fever the day prior to rehospitalization.  CXR 6/19: "1. Cardiomegaly with vascular congestion bilateral pleural  effusions. Airspace disease at the bases could be due to atelectasis  or pneumonia  2. Hazy asymmetric opacity right thorax could be due to pleural  effusion versus asymmetric edema"      SLP Plan  All goals met      Recommendations for follow up therapy are one component of a multi-disciplinary discharge planning process, led by the attending physician.  Recommendations may be updated based on patient status, additional functional criteria and insurance authorization.    Recommendations  Diet recommendations: Dysphagia 3 (mechanical soft);Thin liquid Liquids provided via: Cup;Straw Medication Administration: Crushed with puree Supervision: Staff to assist with self  feeding;Full supervision/cueing for compensatory strategies Compensations: Slow rate;Small sips/bites;Follow solids with liquid                Oral Care Recommendations: Oral care BID Follow Up Recommendations: Skilled nursing-short term rehab (<3 hours/day) Assistance recommended at discharge: Frequent or constant Supervision/Assistance SLP Visit Diagnosis: Dysphagia, oropharyngeal phase (R13.12) Plan: All goals met           Alvin Rubano, Katherene Ponto  06/18/2022, 2:41 PM

## 2022-06-18 NOTE — Progress Notes (Signed)
Patient son [name] Dyer would need a work excuse note from the MD, 6/19-6/20 that he stayed at the bedside in the ED prior to admitting onto 3E.

## 2022-06-18 NOTE — Progress Notes (Signed)
06/17/22 2355  Vitals  Temp (!) 97.5 F (36.4 C)  Temp Source Oral  BP 98/70  MAP (mmHg) 80  BP Location Left Arm  BP Method Automatic  Patient Position (if appropriate) Lying  Pulse Rate 84  Pulse Rate Source Monitor  ECG Heart Rate (!) 122  Resp (!) 31  MEWS COLOR  MEWS Score Color Red  Oxygen Therapy  SpO2 97 %  MEWS Score  MEWS Temp 0  MEWS Systolic 1  MEWS Pulse 2  MEWS RR 2  MEWS LOC 1  MEWS Score 6   Pt been in red mews since shift change. MD notified.

## 2022-06-18 NOTE — Assessment & Plan Note (Signed)
--  trending down; possibly secondary to CHF

## 2022-06-18 NOTE — Progress Notes (Signed)
TRH night cross cover note:   I was notified by RN that this patient has had no documented urine output throughout the day shift.  Ensuing bladder scan revealed greater than 650 cc's.   I subsequently placed order for straight cath x1 now as well as scheduled postvoid residual bladder scans every 6 hours with as needed straight cath for PVR greater than 400 cc.  Topical Eucerin ordered to assist with preservation of skin integrity in the setting of peripheral edema.    Babs Bertin, DO Hospitalist

## 2022-06-19 DIAGNOSIS — G309 Alzheimer's disease, unspecified: Secondary | ICD-10-CM | POA: Diagnosis not present

## 2022-06-19 DIAGNOSIS — J9601 Acute respiratory failure with hypoxia: Secondary | ICD-10-CM | POA: Diagnosis not present

## 2022-06-19 DIAGNOSIS — G9340 Encephalopathy, unspecified: Secondary | ICD-10-CM | POA: Diagnosis not present

## 2022-06-19 DIAGNOSIS — N179 Acute kidney failure, unspecified: Secondary | ICD-10-CM | POA: Diagnosis not present

## 2022-06-19 LAB — COMPREHENSIVE METABOLIC PANEL
ALT: 76 U/L — ABNORMAL HIGH (ref 0–44)
AST: 71 U/L — ABNORMAL HIGH (ref 15–41)
Albumin: 2.2 g/dL — ABNORMAL LOW (ref 3.5–5.0)
Alkaline Phosphatase: 132 U/L — ABNORMAL HIGH (ref 38–126)
Anion gap: 8 (ref 5–15)
BUN: 51 mg/dL — ABNORMAL HIGH (ref 8–23)
CO2: 29 mmol/L (ref 22–32)
Calcium: 7.7 mg/dL — ABNORMAL LOW (ref 8.9–10.3)
Chloride: 105 mmol/L (ref 98–111)
Creatinine, Ser: 1.55 mg/dL — ABNORMAL HIGH (ref 0.44–1.00)
GFR, Estimated: 32 mL/min — ABNORMAL LOW (ref >60)
Glucose, Bld: 133 mg/dL — ABNORMAL HIGH (ref 70–99)
Potassium: 3.8 mmol/L (ref 3.5–5.1)
Sodium: 142 mmol/L (ref 135–145)
Total Bilirubin: 0.9 mg/dL (ref 0.3–1.2)
Total Protein: 5.4 g/dL — ABNORMAL LOW (ref 6.5–8.1)

## 2022-06-19 LAB — CBC
HCT: 39.9 % (ref 36.0–46.0)
Hemoglobin: 12.4 g/dL (ref 12.0–15.0)
MCH: 28.2 pg (ref 26.0–34.0)
MCHC: 31.1 g/dL (ref 30.0–36.0)
MCV: 90.9 fL (ref 80.0–100.0)
Platelets: 79 10*3/uL — ABNORMAL LOW (ref 150–400)
RBC: 4.39 MIL/uL (ref 3.87–5.11)
RDW: 18.6 % — ABNORMAL HIGH (ref 11.5–15.5)
WBC: 7.7 10*3/uL (ref 4.0–10.5)
nRBC: 0.4 % — ABNORMAL HIGH (ref 0.0–0.2)

## 2022-06-19 LAB — IMMATURE PLATELET FRACTION: Immature Platelet Fraction: 24.4 % — ABNORMAL HIGH (ref 1.2–8.6)

## 2022-06-19 LAB — URINE CULTURE: Culture: 10000 — AB

## 2022-06-19 MED ORDER — CHLORHEXIDINE GLUCONATE CLOTH 2 % EX PADS
6.0000 | MEDICATED_PAD | Freq: Every day | CUTANEOUS | Status: DC
  Administered 2022-06-20 – 2022-07-06 (×17): 6 via TOPICAL

## 2022-06-19 MED ORDER — LACTATED RINGERS IV SOLN
INTRAVENOUS | Status: DC
  Administered 2022-06-19: 1000 mL via INTRAVENOUS

## 2022-06-19 NOTE — Progress Notes (Signed)
Progress Note   Patient: [name] Dyer QZE:[id number] DOB: [birth year] DOA: 06/07/2022     3 DOS: the patient was seen and examined on 06/19/2022   Brief hospital course: 88 year old woman presented with fever and lethargy, admitted for acute metabolic encephalopathy, acute hypoxia, AKI, SIRS.  --6/22 mentation significantly improved; will continue IVF and follow AKI  Assessment and Plan: * Acute encephalopathy -- Multifactorial including dehydration, hypoxia, hypernatremia.  UTI doubted. --Superimposed on dementia.  Clinically improved substantially per family at bedside.  Now eating somewhat.  More verbally responsive.   Acute renal failure superimposed on stage 3b chronic kidney disease (HCC) --baseline ~1.1; BUN chronically elevated --secondary to FTT -- Slight improvement.  Continue IV fluids.  Follow urine output.   Acute respiratory failure with hypoxia (HCC) --etiology unclear; right pleural effusion is small --follow; wean oxygen as tolerated.  May be secondary to shallow inspirations and advanced age.  Chest x-ray showed atelectasis. No signs or symptoms of infection.   Hypovolemic hypernatremia. -- Now resolved; secondary to FTT at facility; was not eating   Elevated transaminase level -- Mild elevation, etiology unclear; alkaline phosphatase now elevated compared to yesterday.  Total bilirubin within normal limits. --We will check hepatitis panel   Sepsis considered but BC contaminant and urine unimpressive -- Sepsis ruled out --urine w/ enterococcus; no evidence that it is pathogenic   Chronic diastolic CHF (HCC) --follow, no evidence of volume overload   Alzheimer's disease ([place]) --stable as an inpatient, progressively worsening over the last year per family.  Long discussion with family at bedside detailing the natural trajectory of Alzheimer's disease including difficulty maintaining hydration and nutrition. --continue donepezil, memantine   Permanent atrial  fibrillation (HCC) --continue rivaroxaban     Subjective:  Confused; limited response verbally, unable to offer history Per son [name] and daughter-in-law at bedside the patient is much better, eating fairly well now and more responsive, even spoke a sentence or 2.  This is substantially improved over the last couple of weeks.  She has dementia which has been worsening over the last year.  Recently hospitalized and transferred to SNF where she did very poorly.  Physical Exam: Vitals:   06/19/22 0703 06/19/22 0934 06/19/22 1200 06/19/22 1400  BP:  113/69 120/80 106/74  Pulse: 97 96    Resp: (!) 27 20    Temp:  98.7 F (37.1 C)    TempSrc:      SpO2: 100%     Weight:      Height:       Physical Exam Vitals reviewed.  Constitutional:      General: She is not in acute distress.    Appearance: She is not ill-appearing or toxic-appearing.  Cardiovascular:     Rate and Rhythm: Normal rate and regular rhythm.     Heart sounds: No murmur heard. Pulmonary:     Effort: Pulmonary effort is normal. No respiratory distress.     Breath sounds: No wheezing or rales.  Neurological:     Mental Status: She is alert. She is disoriented.  Psychiatric:     Comments: Awakes to voice; doesn't follow commands; verbal responses inappropriate     Data Reviewed:  Na+ down to 142 Creatinine stable at 1.55 AST stable 71 ALT stable 76 Plts 79  Family Communication: son [name] and daughter in law at bedside  Disposition: Status is: Inpatient Remains inpatient appropriate because: encephalopathic, dehydrated  Planned Discharge Destination: Skilled nursing facility    Time spent: 45 minutes  Author: Murray Hodgkins, MD 06/19/2022 5:56 PM  For on call review [email or web address].

## 2022-06-19 NOTE — TOC Progression Note (Signed)
Transition of Care [facility]) - Progression Note    Patient Details  Name: [name] Dyer MRN: [id number] Date of Birth: [birth year]  [facility] of [facility]) CM/SW [place], [place] Phone Number: 06/19/2022, 2:01 PM  Clinical Narrative:     CSW called pt son [name]; no answer, left voicemail.   CSW called pt son [name] and discussed bed offers. Of the list of offers, his first choice would be [facility] though he expresses he would be interested in any 5 star facilities. CSW explained that all the other facilities that extended bed offers were 1 star rated. He is agreeable to plan for [facility]. CSW will submit request for insurance auth.   Expected Discharge Plan: [place] Barriers to Discharge: Continued Medical Work up, Ship broker,   Expected Discharge Plan and Services Expected Discharge Plan: [place] arrangements for the past 2 months: [place], [place]                                       Social Determinants of Health (SDOH) Interventions    Readmission Risk Interventions    05/31/2022    5:13 PM  Readmission Risk Prevention Plan  Transportation Screening Complete  Medication Review (RN Care Manager) Referral to Pharmacy  PCP or Specialist appointment within 3-5 days of discharge Complete  HRI or Home Care Consult Complete  SW Recovery Care/Counseling Consult Complete  [place] Complete

## 2022-06-20 DIAGNOSIS — R7401 Elevation of levels of liver transaminase levels: Secondary | ICD-10-CM | POA: Diagnosis not present

## 2022-06-20 DIAGNOSIS — G9340 Encephalopathy, unspecified: Secondary | ICD-10-CM | POA: Diagnosis not present

## 2022-06-20 DIAGNOSIS — G309 Alzheimer's disease, unspecified: Secondary | ICD-10-CM | POA: Diagnosis not present

## 2022-06-20 DIAGNOSIS — N179 Acute kidney failure, unspecified: Secondary | ICD-10-CM | POA: Diagnosis not present

## 2022-06-20 LAB — HEPATITIS PANEL, ACUTE
HCV Ab: NONREACTIVE
Hep A IgM: NONREACTIVE
Hep B C IgM: NONREACTIVE
Hepatitis B Surface Ag: NONREACTIVE

## 2022-06-20 LAB — COMPREHENSIVE METABOLIC PANEL
ALT: 69 U/L — ABNORMAL HIGH (ref 0–44)
AST: 68 U/L — ABNORMAL HIGH (ref 15–41)
Albumin: 2.3 g/dL — ABNORMAL LOW (ref 3.5–5.0)
Alkaline Phosphatase: 132 U/L — ABNORMAL HIGH (ref 38–126)
Anion gap: 11 (ref 5–15)
BUN: 45 mg/dL — ABNORMAL HIGH (ref 8–23)
CO2: 27 mmol/L (ref 22–32)
Calcium: 8.4 mg/dL — ABNORMAL LOW (ref 8.9–10.3)
Chloride: 102 mmol/L (ref 98–111)
Creatinine, Ser: 1.12 mg/dL — ABNORMAL HIGH (ref 0.44–1.00)
GFR, Estimated: 47 mL/min — ABNORMAL LOW (ref >60)
Glucose, Bld: 109 mg/dL — ABNORMAL HIGH (ref 70–99)
Potassium: 3.8 mmol/L (ref 3.5–5.1)
Sodium: 140 mmol/L (ref 135–145)
Total Bilirubin: 0.8 mg/dL (ref 0.3–1.2)
Total Protein: 5.5 g/dL — ABNORMAL LOW (ref 6.5–8.1)

## 2022-06-20 MED ORDER — MUSCLE RUB 10-15 % EX CREA
TOPICAL_CREAM | CUTANEOUS | Status: DC | PRN
Start: 2022-06-20 — End: 2022-07-09
  Administered 2022-06-20 – 2022-06-21 (×2): 1 via TOPICAL
  Filled 2022-06-20: qty 85

## 2022-06-20 NOTE — TOC Progression Note (Signed)
Transition of Care [facility]) - Progression Note    Patient Details  Name: MICEALA LETTIERE MRN: [id number] Date of Birth: 11-Feb-1932  [facility]) CM/SW Contact  Erin Sons, [place] Phone Number: 06/20/2022, 2:45 PM  Clinical Narrative:     [facility] approved for SNF 6/23 -- 6/27 [id number]  CSW notified Dorann Lodge of anticipated DC tomorrow.   Expected Discharge Plan: Skilled Nursing Facility Barriers to Discharge: Continued Medical Work up, English as a second language teacher, SNF Pending bed offer  Expected Discharge Plan and Services Expected Discharge Plan: Skilled Nursing Facility       Living arrangements for the past 2 months: Skilled Nursing Facility, Single Family Home                                       Social Determinants of Health (SDOH) Interventions    Readmission Risk Interventions    05/31/2022    5:13 PM  Readmission Risk Prevention Plan  Transportation Screening Complete  Medication Review (RN Care Manager) Referral to Pharmacy  PCP or Specialist appointment within 3-5 days of discharge Complete  HRI or Home Care Consult Complete  SW Recovery Care/Counseling Consult Complete  Palliative Care Screening Not Applicable  Skilled Nursing Facility Complete

## 2022-06-21 DIAGNOSIS — G9340 Encephalopathy, unspecified: Secondary | ICD-10-CM | POA: Diagnosis not present

## 2022-06-21 DIAGNOSIS — I5033 Acute on chronic diastolic (congestive) heart failure: Secondary | ICD-10-CM | POA: Diagnosis not present

## 2022-06-21 DIAGNOSIS — N179 Acute kidney failure, unspecified: Secondary | ICD-10-CM | POA: Diagnosis not present

## 2022-06-21 DIAGNOSIS — I959 Hypotension, unspecified: Secondary | ICD-10-CM

## 2022-06-21 DIAGNOSIS — D696 Thrombocytopenia, unspecified: Secondary | ICD-10-CM

## 2022-06-21 DIAGNOSIS — G309 Alzheimer's disease, unspecified: Secondary | ICD-10-CM | POA: Diagnosis not present

## 2022-06-21 DIAGNOSIS — I951 Orthostatic hypotension: Secondary | ICD-10-CM

## 2022-06-21 LAB — BASIC METABOLIC PANEL
Anion gap: 6 (ref 5–15)
BUN: 40 mg/dL — ABNORMAL HIGH (ref 8–23)
CO2: 27 mmol/L (ref 22–32)
Calcium: 8.2 mg/dL — ABNORMAL LOW (ref 8.9–10.3)
Chloride: 106 mmol/L (ref 98–111)
Creatinine, Ser: 0.91 mg/dL (ref 0.44–1.00)
GFR, Estimated: >60 mL/min (ref >60)
Glucose, Bld: 132 mg/dL — ABNORMAL HIGH (ref 70–99)
Potassium: 3.8 mmol/L (ref 3.5–5.1)
Sodium: 139 mmol/L (ref 135–145)

## 2022-06-21 LAB — CBC
HCT: 36.8 % (ref 36.0–46.0)
Hemoglobin: 11.5 g/dL — ABNORMAL LOW (ref 12.0–15.0)
MCH: 28.3 pg (ref 26.0–34.0)
MCHC: 31.3 g/dL (ref 30.0–36.0)
MCV: 90.4 fL (ref 80.0–100.0)
Platelets: 98 10*3/uL — ABNORMAL LOW (ref 150–400)
RBC: 4.07 MIL/uL (ref 3.87–5.11)
RDW: 18.8 % — ABNORMAL HIGH (ref 11.5–15.5)
WBC: 9.2 10*3/uL (ref 4.0–10.5)
nRBC: 0 % (ref 0.0–0.2)

## 2022-06-21 MED ORDER — FUROSEMIDE 20 MG PO TABS
20.0000 mg | ORAL_TABLET | Freq: Every day | ORAL | Status: DC
  Administered 2022-06-22: 20 mg via ORAL
  Filled 2022-06-21: qty 1

## 2022-06-21 MED ORDER — ATENOLOL 25 MG PO TABS: 25.0000 mg | ORAL_TABLET | Freq: Every day | ORAL | Status: DC

## 2022-06-21 MED ORDER — FUROSEMIDE 20 MG PO TABS: 20.0000 mg | ORAL_TABLET | Freq: Every day | ORAL | Status: DC

## 2022-06-21 MED ORDER — ATENOLOL 25 MG PO TABS
12.5000 mg | ORAL_TABLET | Freq: Every day | ORAL | Status: DC
  Administered 2022-06-22: 12.5 mg via ORAL
  Filled 2022-06-21: qty 0.5

## 2022-06-21 MED ORDER — SODIUM CHLORIDE 0.9 % IV BOLUS
500.0000 mL | Freq: Once | INTRAVENOUS | Status: AC
  Administered 2022-06-21: 500 mL via INTRAVENOUS

## 2022-06-22 ENCOUNTER — Inpatient Hospital Stay (HOSPITAL_COMMUNITY): Payer: Medicare Other

## 2022-06-22 DIAGNOSIS — R609 Edema, unspecified: Secondary | ICD-10-CM

## 2022-06-22 DIAGNOSIS — G9340 Encephalopathy, unspecified: Secondary | ICD-10-CM | POA: Diagnosis not present

## 2022-06-22 DIAGNOSIS — I5033 Acute on chronic diastolic (congestive) heart failure: Secondary | ICD-10-CM | POA: Diagnosis not present

## 2022-06-22 DIAGNOSIS — G309 Alzheimer's disease, unspecified: Secondary | ICD-10-CM | POA: Diagnosis not present

## 2022-06-22 DIAGNOSIS — N179 Acute kidney failure, unspecified: Secondary | ICD-10-CM | POA: Diagnosis not present

## 2022-06-22 DIAGNOSIS — R0902 Hypoxemia: Secondary | ICD-10-CM

## 2022-06-22 LAB — CULTURE, BLOOD (ROUTINE X 2): Culture: NO GROWTH

## 2022-06-22 MED ORDER — ATENOLOL 25 MG PO TABS
25.0000 mg | ORAL_TABLET | Freq: Every day | ORAL | Status: DC
  Administered 2022-06-23 – 2022-06-28 (×6): 25 mg via ORAL
  Filled 2022-06-22 (×6): qty 1

## 2022-06-22 MED ORDER — FUROSEMIDE 40 MG PO TABS
40.0000 mg | ORAL_TABLET | Freq: Every day | ORAL | Status: DC
  Administered 2022-06-23 – 2022-06-25 (×3): 40 mg via ORAL
  Filled 2022-06-22 (×3): qty 1

## 2022-06-22 MED ORDER — FUROSEMIDE 10 MG/ML IJ SOLN
20.0000 mg | Freq: Once | INTRAMUSCULAR | Status: AC
  Administered 2022-06-22: 20 mg via INTRAVENOUS
  Filled 2022-06-22: qty 2

## 2022-06-22 MED ORDER — ATENOLOL 25 MG PO TABS
12.5000 mg | ORAL_TABLET | Freq: Once | ORAL | Status: DC
Start: 2022-06-22 — End: 2022-06-24
  Filled 2022-06-22 (×2): qty 0.5

## 2022-06-22 NOTE — Progress Notes (Signed)
VASCULAR LAB    Bilateral lower extremity venous duplex has been performed.  See CV proc for preliminary results.   Sevan Mcbroom, RVT 06/22/2022, 10:20 AM

## 2022-06-23 DIAGNOSIS — R7401 Elevation of levels of liver transaminase levels: Secondary | ICD-10-CM | POA: Diagnosis not present

## 2022-06-23 DIAGNOSIS — G309 Alzheimer's disease, unspecified: Secondary | ICD-10-CM | POA: Diagnosis not present

## 2022-06-23 DIAGNOSIS — G9340 Encephalopathy, unspecified: Secondary | ICD-10-CM | POA: Diagnosis not present

## 2022-06-23 DIAGNOSIS — R0902 Hypoxemia: Secondary | ICD-10-CM | POA: Diagnosis not present

## 2022-06-23 LAB — COMPREHENSIVE METABOLIC PANEL
ALT: 48 U/L — ABNORMAL HIGH (ref 0–44)
AST: 38 U/L (ref 15–41)
Albumin: 1.9 g/dL — ABNORMAL LOW (ref 3.5–5.0)
Alkaline Phosphatase: 132 U/L — ABNORMAL HIGH (ref 38–126)
Anion gap: 5 (ref 5–15)
BUN: 40 mg/dL — ABNORMAL HIGH (ref 8–23)
CO2: 28 mmol/L (ref 22–32)
Calcium: 7.9 mg/dL — ABNORMAL LOW (ref 8.9–10.3)
Chloride: 109 mmol/L (ref 98–111)
Creatinine, Ser: 0.98 mg/dL (ref 0.44–1.00)
GFR, Estimated: 56 mL/min — ABNORMAL LOW (ref >60)
Glucose, Bld: 98 mg/dL (ref 70–99)
Potassium: 4.1 mmol/L (ref 3.5–5.1)
Sodium: 142 mmol/L (ref 135–145)
Total Bilirubin: 1.4 mg/dL — ABNORMAL HIGH (ref 0.3–1.2)
Total Protein: 4.6 g/dL — ABNORMAL LOW (ref 6.5–8.1)

## 2022-06-23 MED ORDER — FUROSEMIDE 10 MG/ML IJ SOLN
20.0000 mg | Freq: Once | INTRAMUSCULAR | Status: AC
Start: 2022-06-23 — End: 2022-06-23
  Administered 2022-06-23: 20 mg via INTRAVENOUS
  Filled 2022-06-23: qty 2

## 2022-06-23 NOTE — Progress Notes (Signed)
Progress Note   Patient: [name] Dyer ZOX:[id number] DOB: 1932/12/03 DOA: 06/16/2022     7 DOS: the patient was seen and examined on 06/23/2022   Brief hospital course: 88 year old woman presented with fever and lethargy, admitted for acute metabolic encephalopathy, acute hypoxia, AKI, SIRS. Infection ruled out; treated for marked dehydration secondary to FTT secondary to advanced dementia with clinical improvement and improvement in mentation. Approaching discharge thought long-term prognosis is guarded given dementia. Better today; likely back to SNF 1-2 days if improving.  Assessment and Plan: * Acute encephalopathy -- Multifactorial including dehydration, hypoxia, hypernatremia.  UTI ruled out. --Superimposed on dementia. Likely at baseline at this point --further improvement in short-term not expected   AKI superimposed on stage 3b chronic kidney disease (HCC) --baseline ~1.1; BUN chronically elevated; creatinine back to baseline --secondary to FTT --monitor off IVF   BUE edema, dependent + hypoalbuminemia.  -- Venous Doppler negative. Improving now pt is back on diuretic   Mild hypotension -- resolved; etiology unclear   Orthostatic hypotension --was orthostatic w/ PT; per son had several falls before prior previous hospitalization; suspect long-standing; placed TED hose. --Consider abdominal binder if needed   Acute urinary retention --foley placed, given need for multiple I/O caths and advanced dementia, will continue --voiding trial in 1-2 days   Acute hypoxia (HCC) --likely secondary to shallow inspirations, atelectasis, immobility. Chest x-ray showed atelectasis. No signs or symptoms of infection. Stable on 2L [place].   FTT --very poor oral intake. Calorie count started --long-term prognosis guarded  Chronic diastolic CHF (HCC) --anasarca noted; improved with furosemide; predominantly secondary to hypoalbuminemia --will continue Lasix, extra dose today    Alzheimer's dementia (HCC) --stable as an inpatient, progressively worsening over the last year per family.  Long discussion with family at bedside 6/22 detailing the natural trajectory of Alzheimer's disease including difficulty maintaining hydration and nutrition. --continue donepezil, memantine   Hypovolemic hypernatremia. -- Resolved; secondary to FTT at facility; was not eating   Elevated transaminase levels, elevated AP, Tbili -- Mild elevation, etiology unclear;  hepatitis panel negative. Could be statin-induced--statin stopped. --AST now WNL, ALT trending down; AP stable --CMP in AM    Permanent atrial fibrillation (HCC) --continue rivaroxaban   Thrombocytopenia --chronic, follow intermittently   GOC --difficult case.  2 brothers share power of attorney.  Younger brother desires full CODE STATUS, he is quite aware of implications of CPR.  Palliative care consultation was offered and declined.  Sepsis considered but BC contaminant and urine unimpressive -- Sepsis ruled out -- urine w/ enterococcus; no evidence that it is pathogenic, considered asymptomatic bacturia     Subjective:  No events noted overnight D/w RN, has a skin care left arm UE edema improved Patient does not provide history  Physical Exam: Vitals:   06/23/22 0345 06/23/22 0405 06/23/22 0425 06/23/22 0500  BP:  116/87    Pulse:  95 93   Resp:  (!) 26 19   Temp:  98 F (36.7 C)    TempSrc:  Oral    SpO2: 97% 95% 98%   Weight:    71.3 kg  Height:      AF SBP improved to 100s [place] down to 2L RR down to 20s Physical Exam Vitals reviewed.  Constitutional:      General: She is not in acute distress.    Appearance: She is not ill-appearing (chronically) or toxic-appearing.  Cardiovascular:     Rate and Rhythm: Normal rate. Rhythm irregular.     Heart  sounds: No murmur heard.    Comments: Telemetry afib 90s Pulmonary:     Effort: Pulmonary effort is normal. No respiratory distress.     Breath  sounds: No wheezing, rhonchi or rales.     Comments: Respirations shallow Musculoskeletal:     Right lower leg: Edema present.     Left lower leg: Edema present.     Comments: Still has anasarca, but decreasing in UE and LE  Neurological:     Mental Status: She is alert.  Psychiatric:        Mood and Affect: Mood normal.        Behavior: Behavior normal.     Data Reviewed:  UOP 1050 +2.7  K+ 4.1 Creatinine stable 0.98 AST WNL ALT 48 Tbili 1.4, AP 132  Family Communication: none Discussed at bedside w/ [name] Dyer and [name] Dyer yesterday  Disposition: Status is: Inpatient Remains inpatient appropriate because: volume overload  Planned Discharge Destination: Skilled nursing facility    Time spent: 25 minutes  Author: Brendia Sacks, MD 06/23/2022 11:31 AM  For on call review [email or web address].

## 2022-06-24 DIAGNOSIS — R627 Adult failure to thrive: Secondary | ICD-10-CM | POA: Diagnosis not present

## 2022-06-24 DIAGNOSIS — G9340 Encephalopathy, unspecified: Secondary | ICD-10-CM | POA: Diagnosis not present

## 2022-06-24 DIAGNOSIS — R1310 Dysphagia, unspecified: Secondary | ICD-10-CM

## 2022-06-24 DIAGNOSIS — R0902 Hypoxemia: Secondary | ICD-10-CM

## 2022-06-24 DIAGNOSIS — D696 Thrombocytopenia, unspecified: Secondary | ICD-10-CM | POA: Diagnosis present

## 2022-06-24 DIAGNOSIS — R601 Generalized edema: Secondary | ICD-10-CM

## 2022-06-24 DIAGNOSIS — R338 Other retention of urine: Secondary | ICD-10-CM | POA: Diagnosis present

## 2022-06-24 DIAGNOSIS — J9601 Acute respiratory failure with hypoxia: Secondary | ICD-10-CM | POA: Diagnosis not present

## 2022-06-24 DIAGNOSIS — G309 Alzheimer's disease, unspecified: Secondary | ICD-10-CM | POA: Diagnosis not present

## 2022-06-24 DIAGNOSIS — I951 Orthostatic hypotension: Secondary | ICD-10-CM | POA: Diagnosis present

## 2022-06-24 LAB — COMPREHENSIVE METABOLIC PANEL
ALT: 56 U/L — ABNORMAL HIGH (ref 0–44)
AST: 48 U/L — ABNORMAL HIGH (ref 15–41)
Albumin: 2.2 g/dL — ABNORMAL LOW (ref 3.5–5.0)
Alkaline Phosphatase: 143 U/L — ABNORMAL HIGH (ref 38–126)
Anion gap: 9 (ref 5–15)
BUN: 41 mg/dL — ABNORMAL HIGH (ref 8–23)
CO2: 28 mmol/L (ref 22–32)
Calcium: 8.3 mg/dL — ABNORMAL LOW (ref 8.9–10.3)
Chloride: 104 mmol/L (ref 98–111)
Creatinine, Ser: 1.04 mg/dL — ABNORMAL HIGH (ref 0.44–1.00)
GFR, Estimated: 52 mL/min — ABNORMAL LOW (ref >60)
Glucose, Bld: 115 mg/dL — ABNORMAL HIGH (ref 70–99)
Potassium: 3.7 mmol/L (ref 3.5–5.1)
Sodium: 141 mmol/L (ref 135–145)
Total Bilirubin: 0.8 mg/dL (ref 0.3–1.2)
Total Protein: 5.6 g/dL — ABNORMAL LOW (ref 6.5–8.1)

## 2022-06-24 NOTE — Evaluation (Signed)
Clinical/Bedside Swallow Evaluation Patient Details  Name: [name] Dyer MRN: [id number] Date of Birth: 12-18-1932  Today's Date: 06/24/2022 Time: SLP Start Time (ACUTE ONLY): 1405 SLP Stop Time (ACUTE ONLY): 1435 SLP Time Calculation (min) (ACUTE ONLY): 30 min  Past Medical History:  Past Medical History:  Diagnosis Date   Alzheimer disease (HCC)    Anemia    Arrhythmia    Cataract    Colon polyps    Community acquired pneumonia 01/2012   LLL   Congestive heart failure (HCC)    Hyperlipidemia    Hypertension    Memory difficulty 10/01/2016   Osteoporosis    Permanent atrial fibrillation (HCC)    Pleural effusion, right 01/2012   SVT (supraventricular tachycardia) (HCC) 01/2012   UTI (lower urinary tract infection)    Past Surgical History:  Past Surgical History:  Procedure Laterality Date   ABDOMINAL HYSTERECTOMY  1983   APPENDECTOMY  1983   COLONOSCOPY     COLONOSCOPY N/A 06/10/2016   Procedure: COLONOSCOPY;  Surgeon: Ruffin Frederick, MD;  Location: [facility]COPY;  Service: Gastroenterology;  Laterality: N/A;   ESOPHAGOGASTRODUODENOSCOPY N/A 06/10/2016   Procedure: ESOPHAGOGASTRODUODENOSCOPY (EGD);  Surgeon: Ruffin Frederick, MD;  Location: [facility];  Service: Gastroenterology;  Laterality: N/A;   HEMIARTHROPLASTY HIP  2011   Left femoral neck fracture   RIGHT/LEFT HEART CATH AND CORONARY ANGIOGRAPHY N/A 01/08/2022   Procedure: RIGHT/LEFT HEART CATH AND CORONARY ANGIOGRAPHY;  Surgeon: Marykay Lex, MD;  Location: [facility] INVASIVE CV LAB;  Service: Cardiovascular;  Laterality: N/A;   UPPER GASTROINTESTINAL ENDOSCOPY     HPI:  [name] Dyer is an 88 yo female who was admitted to the hospital with the working diagnosis of acute metabolic encephalopathy. Pt with the past medical history of atrial fibrillation, dementia, chronic heart failure, CKD stage 3b, recent hospitalization for ESBL Klebsiella urine infection (05/29/22 to 06/09/22). Patient was  discharged to SNF with severe deconditioning. Post discharge patient had very poor oral intake, increase 02 requirements for 2 to 3 days and fever the day prior to rehospitalization.  CXR 6/19: "1. Cardiomegaly with vascular congestion bilateral pleural  effusions. Airspace disease at the bases could be due to atelectasis  or pneumonia  2. Hazy asymmetric opacity right thorax could be due to pleural  effusion versus asymmetric edema". Pt was discharged from speech on a Dys 3 diet. Re-ordered today due to significant pocketing.    Assessment / Plan / Recommendation  Clinical Impression  ST reordered due to significant oral pocketing and RN removal of large amount of po's following breakfast this am, seen with son at bedside. She was lethargic but adequately awake for trials of puree, thin liquids. Periodic oral holding ranging from 10-45 seconds and suspicion of delayed onset of swallow. Great deal of eval was aimed at visual and verbal education with son re; feeding and nature of current swallow status in setting of advanced dementia. Taught son to look for her "adams apple" and pointed in out when she completed a full swallow versus swallow attempts. Used "dry spoon" method and lingual pressure in attempts to initiate swallow after more than reasonable amount of time passed for oral transit and demonstrated throughout assessment. Son educated to allow pt to take 1-2 sips versus filling oral cavity (stated his brother would let her continuously drink until mouth was full and pt unable to propel).  Son voiced understanding and was in agreement with downgrade from Dys 3 (chopped meats) to Dys 1 (puree), continue thin.  Family will need further education in assisting in feeding pt and understanding her current swallow status. SLP Visit Diagnosis: Dysphagia, oral phase (R13.11)    Aspiration Risk  Mild aspiration risk;Moderate aspiration risk    Diet Recommendation Dysphagia 1 (Puree);Thin liquid   Liquid  Administration via: Cup;Straw Medication Administration: Crushed with puree Supervision: Staff to assist with self feeding;Full supervision/cueing for compensatory strategies Compensations: Minimize environmental distractions;Slow rate;Small sips/bites;Lingual sweep for clearance of pocketing;Monitor for anterior loss;Other (Comment) (dry spoon to initiate swallow when needed) Postural Changes: Seated upright at 90 degrees    Other  Recommendations Oral Care Recommendations: Oral care BID    Recommendations for follow up therapy are one component of a multi-disciplinary discharge planning process, led by the attending physician.  Recommendations may be updated based on patient status, additional functional criteria and insurance authorization.  Follow up Recommendations Skilled nursing-short term rehab (<3 hours/day)      Assistance Recommended at Discharge Frequent or constant Supervision/Assistance  Functional Status Assessment Patient has had a recent decline in their functional status and demonstrates the ability to make significant improvements in function in a reasonable and predictable amount of time.  Frequency and Duration min 2x/week  2 weeks       Prognosis Prognosis for Safe Diet Advancement: Fair Barriers to Reach Goals: Cognitive deficits;Time post onset      Swallow Study   General HPI: [name] Dyer is an 88 yo female who was admitted to the hospital with the working diagnosis of acute metabolic encephalopathy. Pt with the past medical history of atrial fibrillation, dementia, chronic heart failure, CKD stage 3b, recent hospitalization for ESBL Klebsiella urine infection (05/29/22 to 06/09/22). Patient was discharged to SNF with severe deconditioning. Post discharge patient had very poor oral intake, increase 02 requirements for 2 to 3 days and fever the day prior to rehospitalization.  CXR 6/19: "1. Cardiomegaly with vascular congestion bilateral pleural  effusions.  Airspace disease at the bases could be due to atelectasis  or pneumonia  2. Hazy asymmetric opacity right thorax could be due to pleural  effusion versus asymmetric edema". Pt was discharged from speech on a Dys 3 diet. Re-ordered today due to significant pocketing. Type of Study: Bedside Swallow Evaluation Previous Swallow Assessment:  (see HPI) Diet Prior to this Study: NPO (made NPO- was on Dys 3/thin) Temperature Spikes Noted: No Respiratory Status: Nasal cannula History of Recent Intubation: No Behavior/Cognition: Lethargic/Drowsy;Cooperative;Requires cueing Oral Cavity Assessment:  (unable to fully view) Oral Care Completed by SLP: No Oral Cavity - Dentition: Adequate natural dentition Vision: Functional for self-feeding Self-Feeding Abilities: Total assist Patient Positioning: Upright in bed Baseline Vocal Quality: Low vocal intensity;Other (comment) (clear) Volitional Cough: Cognitively unable to elicit Volitional Swallow: Unable to elicit    Oral/Motor/Sensory Function Overall Oral Motor/Sensory Function:  (no focal deficits)   Ice Chips Ice chips: Not tested   Thin Liquid Thin Liquid: Impaired Presentation: Straw Oral Phase Functional Implications: Oral holding (mild) Pharyngeal  Phase Impairments: Suspected delayed Swallow    Nectar Thick Nectar Thick Liquid: Not tested   Honey Thick Honey Thick Liquid: Not tested   Puree Puree: Impaired Presentation: Spoon Oral Phase Impairments: Poor awareness of bolus Oral Phase Functional Implications: Oral holding Pharyngeal Phase Impairments:  (none)   Solid     Solid: Not tested      Royce Macadamia 06/24/2022,3:21 PM

## 2022-06-25 ENCOUNTER — Inpatient Hospital Stay (HOSPITAL_COMMUNITY): Payer: Medicare Other

## 2022-06-25 DIAGNOSIS — J9601 Acute respiratory failure with hypoxia: Secondary | ICD-10-CM | POA: Diagnosis not present

## 2022-06-25 DIAGNOSIS — N179 Acute kidney failure, unspecified: Secondary | ICD-10-CM | POA: Diagnosis not present

## 2022-06-25 DIAGNOSIS — G9340 Encephalopathy, unspecified: Secondary | ICD-10-CM | POA: Diagnosis not present

## 2022-06-25 DIAGNOSIS — G309 Alzheimer's disease, unspecified: Secondary | ICD-10-CM | POA: Diagnosis not present

## 2022-06-25 DIAGNOSIS — L89150 Pressure ulcer of sacral region, unstageable: Secondary | ICD-10-CM

## 2022-06-25 MED ORDER — METOPROLOL TARTRATE 5 MG/5ML IV SOLN
5.0000 mg | Freq: Once | INTRAVENOUS | Status: AC
  Administered 2022-06-25: 5 mg via INTRAVENOUS
  Filled 2022-06-25: qty 5

## 2022-06-25 MED ORDER — FUROSEMIDE 20 MG PO TABS
20.0000 mg | ORAL_TABLET | Freq: Every day | ORAL | Status: DC
  Administered 2022-06-26 – 2022-06-29 (×4): 20 mg via ORAL
  Filled 2022-06-25 (×4): qty 1

## 2022-06-25 MED ORDER — FUROSEMIDE 10 MG/ML IJ SOLN
40.0000 mg | Freq: Once | INTRAMUSCULAR | Status: AC
  Administered 2022-06-25: 40 mg via INTRAVENOUS
  Filled 2022-06-25: qty 4

## 2022-06-25 NOTE — Progress Notes (Signed)
Notified on call provider of mews of yellow, RR 37-40, Right lung crackles over all and left upper lobe crackles.  Patient took in over 800 ml during dinner tray with 271m urine output over this shift.  HR l120-130., abdomen firm last BM was soft today @ 2200. 3rd spacing in upper extremities noted.

## 2022-06-25 NOTE — Progress Notes (Signed)
Progress Note   Patient: [name] Dyer UTM:[id number] DOB: 1932-04-03 DOA: 05/30/2022     9 DOS: the patient was seen and examined on 06/25/2022   Brief hospital course: 88 year old woman presented with fever and lethargy, admitted for acute metabolic encephalopathy, acute hypoxia, AKI, SIRS. Infection ruled out; treated for marked dehydration secondary to FTT secondary to advanced dementia with clinical improvement and improvement in mentation.  Hospitalization prolonged by mild volume overload nearly resolved.  Main issue now is advanced dementia, failure to thrive and poor oral intake.  Calorie counting in process.  Return to SNF probably in the next 48 hours.  Assessment and Plan: * Acute encephalopathy -Multifactorial:including dehydration, hypoxia, hypernatremia.  UTI ruled out. --Superimposed on dementia. Likely at baseline at this point --further improvement in short-term not expected. --High risk rehospitalization from failure to thrive. -- Advance care planning and goals of care discussion after discharge extremely recommended.   AKI superimposed on stage 3b chronic kidney disease (HCC) --baseline ~1.1; BUN chronically elevated; creatinine back to baseline after IVF's --Continue to check renal function trend intermittently.  Anasarca secondary to hypoalbuminemia and component chronic diastolic CHF --third-spacing; likely nearing balance between peripheral edema and intravascular volume --continue Lasix; dose adjusted.  Orthostatic hypotension --was orthostatic w/ PT; per son had several falls before prior previous hospitalization; suspect long-standing. -continue TED hose and Consider abdominal binder if needed. -Stable blood pressure at rest.   Acute urinary retention --foley placed, given need for multiple I/O caths and advanced dementia, will continue --attempt voiding trial and follow ability to urinate on her own.   Acute hypoxia (HCC) --likely secondary to shallow  inspirations, atelectasis, immobility.  --Chest x-ray showed atelectasis. No signs or symptoms of infection.  -Stable saturation on 2L [place].  Dysphagia -- Appreciate assistance and evaluation by speech therapy service; dysphagia 1 with thin liquids recommended.   FTT --very poor oral intake.  --per Calorie count patient consuming around [phone] calories inconsistently  --long-term prognosis guarded --advance care planning and [place] discussion recommended as an outpatient.   Alzheimer's dementia ([place]) --stable as an inpatient, progressively worsening over the last year per family.   --Long discussion with family at bedside by Dr. Sarajane Jews on 6/22 detailing the natural trajectory of Alzheimer's disease including difficulty maintaining hydration and nutrition. --continue donepezil, Namenda and supportive care. --I have discussed with Son [name] on 6/28) raising concerns for patient stability and high risk for hospital readmission and guarded prognosis.   Hypovolemic hypernatremia. -- Resolved; secondary to FTT at facility; was not eating or drinking for over a week.   Elevated transaminase levels, elevated AP, Tbili -- Mild elevation, etiology unclear;  hepatitis panel negative. Could be statin-induced--statin stopped. --follow intermittently.   Permanent atrial fibrillation (HCC) --will continue rivaroxaban   Thrombocytopenia --chronic, follow intermittently --no overt bleeding appreciated.   GOC --difficult case.  2 brothers share power of attorney.  Younger brother desires full CODE STATUS, he is quite aware of implications of CPR.  Palliative care consultation was offered and declined. --Recommending outpatient discussion for goals of care and advance care planning.  Sepsis considered but BC contaminant and urine unimpressive -- Sepsis ruled out -- urine w/ enterococcus; no evidence that it is pathogenic, considered asymptomatic bacteriuria.  Mid coccyx pressure  injury --unstageable --POA --no signs of superimposed infection.   Subjective:  Sleepy/somnolent; no chest pain, no nausea, no vomiting.  Some discomfort elicited while trying to move her limbs.  Requiring maximum assistance for all activities.  Physical Exam: Vitals:  06/25/22 0200 06/25/22 0632 06/25/22 0832 06/25/22 1202  BP: (!) 122/56  119/70 119/71  Pulse: (!) 101  99 98  Resp: (!) 34  (!) 24 (!) 21  Temp: (!) 97.5 F (36.4 C)  99.3 F (37.4 C) 98 F (36.7 C)  TempSrc: Oral  Axillary Axillary  SpO2: 96%  99% 100%  Weight:  72.4 kg    Height:        General exam: Sleepy, somnolent having difficulty following commands.  Max assistant required.  No fever, no overnight events. Respiratory system: Good air movement; no using accessory muscles.  Positive rhonchi.  No wheezing or crackles.  2 L nasal cannula supplementation Cardiovascular system: Rate controlled, no rubs, no gallops, no JVD. Gastrointestinal system: Abdomen is nondistended, soft and nontender. No organomegaly or masses felt. Normal bowel sounds heard. Central nervous system: Limited examination due to underlying dementia.  Currently not following commands at time of examination. Extremities: No cyanosis or clubbing; trace to 1+ edema appreciated bilaterally. Skin: No petechiae; mid coccyx unstageable pressure injury without superimposed infection appreciated on exam.  Present at time of admission. Psychiatry: Judgement and insight impaired due to underlying dementia.    Data Reviewed: Latest info reviewed. Not new data today.  UOP 1475 +2.5L Creatinine stable 1.04 AP stable 143 Tbili WNL AST stable 48 ALT stable 56   Family Communication: son [name] by phone.  Disposition: Status is: Inpatient Remains inpatient appropriate because: FTT, poor oral intake  Planned Discharge Destination: Skilled nursing facility  Author: Barton Dubois, MD 06/25/2022 3:37 PM  For on call review [email or web address].

## 2022-06-25 NOTE — Progress Notes (Signed)
Occupational Therapy Treatment Patient Details Name: [name] Dyer MRN: [id number] DOB: [birth year] Today's Date: 06/25/2022   History of present illness Pt is an 88 y/o female admitted from SNF secondary to sepsis from UTI and acute metabolic encephalopathy. PMH includes dementia, CHF, a fib, HTN, CKD, and CHF.   OT comments  Patient received in supine demonstrating right lateral leaning and was assisted to correct posture to prepare for self feeding. Patient placed in chair position and ROM performed to RUE to prepare for self feeding with patient yelling out but did not continue with ROM. Patient RUE positioned with pillows under elbow but also required assistance to keep elbow up for self feeding. Patient was max assist to feed self magic cup with hand over hand assist and cueing.  Patient checked for cleared mouth before each bite and ate 75% of magic cup. Patient appeared fatigued at this point and family continued with feeding patient. Acute OT to continue to follow.    Recommendations for follow up therapy are one component of a multi-disciplinary discharge planning process, led by the attending physician.  Recommendations may be updated based on patient status, additional functional criteria and insurance authorization.    Follow Up Recommendations  Skilled nursing-short term rehab (<3 hours/day)    Assistance Recommended at Discharge Frequent or constant Supervision/Assistance  Patient can return home with the following  Two people to help with walking and/or transfers;Two people to help with bathing/dressing/bathroom;Assistance with feeding;Assist for transportation;Direct supervision/assist for medications management   Equipment Recommendations  None recommended by OT    Recommendations for Other Services      Precautions / Restrictions Precautions Precautions: Fall Precaution Comments: advanced dementia Restrictions Weight Bearing Restrictions: No       Mobility Bed  Mobility Overal bed mobility: Needs Assistance             General bed mobility comments: total assist to position in bed to prepare for self feeding    Transfers                         Balance                                           ADL either performed or assessed with clinical judgement   ADL Overall ADL's : Needs assistance/impaired Eating/Feeding: Maximal assistance Eating/Feeding Details (indicate cue type and reason): Hand over hand technique with support at RUE elbow and wrist Grooming: Total assistance Grooming Details (indicate cue type and reason): to wipe face                               General ADL Comments: Patient seen for self feeding in bed in chair position. Elbow positioned up with pillow and support at elbow and wrist to bring food to mouth with hand over hand technique    Extremity/Trunk Assessment Upper Extremity Assessment RUE Deficits / Details: limited tolerance to ROM with noted edema in RUE. Yelled out initially during feeding tasks but not again            Vision       Perception     Praxis      Cognition Arousal/Alertness: Lethargic Behavior During Therapy: Flat affect Overall Cognitive Status: History of cognitive impairments - at baseline Area of Impairment:  Attention, Awareness, Safety/judgement, Following commands                       Following Commands: Follows one step commands inconsistently       General Comments: answered with "yeah" followed commands 25% of time        Exercises      Shoulder Instructions       General Comments      Pertinent Vitals/ Pain       Pain Assessment Pain Assessment: Faces Faces Pain Scale: Hurts a little bit Pain Location: BUE with movement Pain Descriptors / Indicators: Grimacing, Other (Comment) (yelling out) Pain Intervention(s): Monitored during session, Repositioned  Home Living                                           Prior Functioning/Environment              Frequency  Min 2X/week        Progress Toward Goals  OT Goals(current goals can now be found in the care plan section)  Progress towards OT goals: Progressing toward goals  Acute Rehab OT Goals Patient Stated Goal: get better OT Goal Formulation: With family Time For Goal Achievement: 07/02/22 Potential to Achieve Goals: Poor ADL Goals Pt Will Perform Eating: with min assist;with adaptive utensils Pt Will Perform Grooming: with min assist;bed level  Plan Discharge plan remains appropriate    Co-evaluation                 AM-PAC OT "6 Clicks" Daily Activity     Outcome Measure   Help from another person eating meals?: Total Help from another person taking care of personal grooming?: Total Help from another person toileting, which includes using toliet, bedpan, or urinal?: Total Help from another person bathing (including washing, rinsing, drying)?: Total Help from another person to put on and taking off regular upper body clothing?: Total Help from another person to put on and taking off regular lower body clothing?: Total 6 Click Score: 6    End of Session    OT Visit Diagnosis: Other abnormalities of gait and mobility (R26.89);Muscle weakness (generalized) (M62.81);Other symptoms and signs involving cognitive function;Pain Pain - part of body: Arm;Leg   Activity Tolerance Patient limited by fatigue   Patient Left in bed;with call bell/phone within reach;with bed alarm set;with family/visitor present   Nurse Communication Other (comment) (% of intake with self feeding)        Time: 7124-5809 OT Time Calculation (min): 36 min  Charges: OT General Charges $OT Visit: 1 Visit OT Treatments $Self Care/Home Management : 23-37 mins  Lodema Hong, [place]  Office [place] 06/25/2022, 1:55 PM

## 2022-06-25 NOTE — TOC Progression Note (Signed)
Transition of Care [facility]) - Progression Note    Patient Details  Name: [name] Dyer MRN: [id number] Date of Birth: August 08, 1932  Transition of Care [facility]) CM/SW [place], [place] Phone Number: 06/25/2022, 4:32 PM  Clinical Narrative:     SNF auth request submitted in [employer] portal for potential DC tomorrow to [facility]. Auth pending.   Expected Discharge Plan: [place] Barriers to Discharge: Continued Medical Work up, Ship broker, SNF Pending bed offer  Expected Discharge Plan and Services Expected Discharge Plan: [place] arrangements for the past 2 months: [place], Single Family Home                                       Social Determinants of Health (SDOH) Interventions    Readmission Risk Interventions    05/31/2022    5:13 PM  Readmission Risk Prevention Plan  Transportation Screening Complete  Medication Review (RN Care Manager) Referral to Pharmacy  PCP or Specialist appointment within 3-5 days of discharge Complete  HRI or Home Care Consult Complete  SW Recovery Care/Counseling Consult Complete  [place] Complete

## 2022-06-25 NOTE — Progress Notes (Signed)
Calorie Count Note  48 hour calorie count ordered by MD.  Suspect intake will continue to be inconsistent based on dementia diagnosis and dysphagia. Pt seems to do best with liquid oral nutrition supplements. Will continue to offer these to promote nutrition.   Diet: Dysphagia 1 with thin liquids  Supplements:  Ensure Enlive TID Magic Cup TID MVI with minerals daily   6/27 -  Lunch: none due to concerns about pocketing  Dinner: Ate most of food - 310 kcal and 20 grams protein   Supplements: 500 kcal and 40 grams protein  6/28 -  Breakfast: none per RN  Total intake x 24 hours: 810 kcal (57% of minimum estimated needs)  60 protein (85% of minimum estimated needs)   Lockie Pares., RD, LDN, CNSC See AMiON for contact information

## 2022-06-26 ENCOUNTER — Other Ambulatory Visit: Payer: Self-pay | Admitting: Internal Medicine

## 2022-06-26 ENCOUNTER — Inpatient Hospital Stay (HOSPITAL_COMMUNITY): Payer: Medicare Other

## 2022-06-26 DIAGNOSIS — G9340 Encephalopathy, unspecified: Secondary | ICD-10-CM | POA: Diagnosis not present

## 2022-06-26 DIAGNOSIS — G309 Alzheimer's disease, unspecified: Secondary | ICD-10-CM | POA: Diagnosis not present

## 2022-06-26 DIAGNOSIS — N179 Acute kidney failure, unspecified: Secondary | ICD-10-CM | POA: Diagnosis not present

## 2022-06-26 DIAGNOSIS — J9601 Acute respiratory failure with hypoxia: Secondary | ICD-10-CM | POA: Diagnosis not present

## 2022-06-26 LAB — BASIC METABOLIC PANEL
Anion gap: 9 (ref 5–15)
Anion gap: 11 (ref 5–15)
BUN: 50 mg/dL — ABNORMAL HIGH (ref 8–23)
BUN: 61 mg/dL — ABNORMAL HIGH (ref 8–23)
CO2: 30 mmol/L (ref 22–32)
CO2: 27 mmol/L (ref 22–32)
Calcium: 8.2 mg/dL — ABNORMAL LOW (ref 8.9–10.3)
Calcium: 8.3 mg/dL — ABNORMAL LOW (ref 8.9–10.3)
Chloride: 103 mmol/L (ref 98–111)
Chloride: 98 mmol/L (ref 98–111)
Creatinine, Ser: 0.95 mg/dL (ref 0.44–1.00)
Creatinine, Ser: 0.99 mg/dL (ref 0.44–1.00)
GFR, Estimated: 58 mL/min — ABNORMAL LOW (ref >60)
GFR, Estimated: 55 mL/min — ABNORMAL LOW (ref >60)
Glucose, Bld: 122 mg/dL — ABNORMAL HIGH (ref 70–99)
Glucose, Bld: 127 mg/dL — ABNORMAL HIGH (ref 70–99)
Potassium: 3.6 mmol/L (ref 3.5–5.1)
Potassium: 4.3 mmol/L (ref 3.5–5.1)
Sodium: 142 mmol/L (ref 135–145)
Sodium: 136 mmol/L (ref 135–145)

## 2022-06-26 LAB — URINALYSIS, MICROSCOPIC (REFLEX)

## 2022-06-26 LAB — URINALYSIS, ROUTINE W REFLEX MICROSCOPIC
Bilirubin Urine: NEGATIVE
Glucose, UA: NEGATIVE mg/dL
Hgb urine dipstick: NEGATIVE
Ketones, ur: NEGATIVE mg/dL
Nitrite: NEGATIVE
Protein, ur: NEGATIVE mg/dL
Specific Gravity, Urine: 1.01 (ref 1.005–1.030)
pH: 5.5 (ref 5.0–8.0)

## 2022-06-26 LAB — CBC
HCT: 32.3 % — ABNORMAL LOW (ref 36.0–46.0)
HCT: 32.3 % — ABNORMAL LOW (ref 36.0–46.0)
Hemoglobin: 10.5 g/dL — ABNORMAL LOW (ref 12.0–15.0)
Hemoglobin: 10.3 g/dL — ABNORMAL LOW (ref 12.0–15.0)
MCH: 29.4 pg (ref 26.0–34.0)
MCH: 29 pg (ref 26.0–34.0)
MCHC: 32.5 g/dL (ref 30.0–36.0)
MCHC: 31.9 g/dL (ref 30.0–36.0)
MCV: 90.5 fL (ref 80.0–100.0)
MCV: 91 fL (ref 80.0–100.0)
Platelets: UNDETERMINED 10*3/uL (ref 150–400)
Platelets: 150 10*3/uL (ref 150–400)
RBC: 3.57 MIL/uL — ABNORMAL LOW (ref 3.87–5.11)
RBC: 3.55 MIL/uL — ABNORMAL LOW (ref 3.87–5.11)
RDW: 19.7 % — ABNORMAL HIGH (ref 11.5–15.5)
RDW: 19.8 % — ABNORMAL HIGH (ref 11.5–15.5)
WBC: 8.4 10*3/uL (ref 4.0–10.5)
WBC: 10.8 10*3/uL — ABNORMAL HIGH (ref 4.0–10.5)
nRBC: 0 % (ref 0.0–0.2)
nRBC: 0.2 % (ref 0.0–0.2)

## 2022-06-26 LAB — LACTIC ACID, PLASMA
Lactic Acid, Venous: 1.6 mmol/L (ref 0.5–1.9)
Lactic Acid, Venous: 1.9 mmol/L (ref 0.5–1.9)

## 2022-06-26 LAB — PROCALCITONIN: Procalcitonin: 0.17 ng/mL

## 2022-06-26 LAB — BRAIN NATRIURETIC PEPTIDE
B Natriuretic Peptide: 966.9 pg/mL — ABNORMAL HIGH (ref 0.0–100.0)
B Natriuretic Peptide: 819.1 pg/mL — ABNORMAL HIGH (ref 0.0–100.0)

## 2022-06-26 NOTE — Progress Notes (Addendum)
Calorie Count Note  48 hour calorie count ordered by MD.  Completed.  Intake inconsistent, if able to consume supplements can meet needs.  Per SLP pt safe for diet but still exhibits oral holding.  Pt at high risk for not meeting needs due to dementia, dysphagia with pocketing food, and length of time to consume meal.    Diet: Dysphagia 1 with thin liquids  Supplements:  Ensure Enlive TID Magic Cup TID MVI with minerals daily  6/28 -  Breakfast: none Lunch: none Dinner: 100% 665 kcal and 32 grams protein  Supplements  Includes 2 magic cups and 1 ensure enlive 850 kcal and 38 grams protein  Total intake: 1515 kcal (>100% of minimum estimated needs)  70 grams protein (100% of minimum estimated needs)   Lockie Pares., RD, LDN, CNSC See AMiON for contact information

## 2022-06-26 NOTE — Progress Notes (Signed)
Progress Note   Patient: [name] Dyer WVP:[id number] DOB: 1932-04-10 DOA: 06/12/2022     10 DOS: the patient was seen and examined on 06/26/2022   Brief hospital course: 88 year old woman presented with fever and lethargy, admitted for acute metabolic encephalopathy, acute hypoxia, AKI, SIRS. Infection ruled out; treated for marked dehydration secondary to FTT secondary to advanced dementia with clinical improvement and improvement in mentation.  Hospitalization prolonged by mild volume overload nearly resolved.  Main issue now is advanced dementia, failure to thrive and poor oral intake.  Calorie counting in process.  Return to SNF probably in the next 48 hours.  Assessment and Plan: * Acute encephalopathy -Multifactorial:including dehydration, hypoxia, hypernatremia.  UTI ruled out. --Superimposed on dementia. Likely at baseline at this point --further improvement in short-term not expected. --High risk rehospitalization from failure to thrive. -- Advance care planning and goals of care discussion after discharge extremely recommended.   AKI superimposed on stage 3b chronic kidney disease (HCC) --baseline ~1.1; BUN chronically elevated; creatinine back to baseline after IVF's --Continue to check renal function trend intermittently.  Anasarca secondary to hypoalbuminemia and component chronic diastolic CHF --third-spacing; likely nearing balance between peripheral edema and intravascular volume --continue Lasix; dose adjusted.  Orthostatic hypotension --was orthostatic w/ PT; per son had several falls before prior previous hospitalization; suspect long-standing. -continue TED hose and Consider abdominal binder if needed. -Stable blood pressure at rest.   Acute urinary retention --foley placed, given need for multiple I/O caths and advanced dementia, will continue --attempt voiding trial and follow ability to urinate on her own.   Acute hypoxia (HCC) --likely secondary to  shallow inspirations, atelectasis, immobility.  --Chest x-ray showed atelectasis. No signs or symptoms of infection.  -Stable saturation on 2L [place].  Dysphagia -- Appreciate assistance and evaluation by speech therapy service; dysphagia 1 with thin liquids recommended.   FTT --very poor oral intake.  --per Calorie count patient consuming around [phone] calories inconsistently. --long-term prognosis guarded --advance care planning and [place] discussion recommended as an outpatient.   Alzheimer's dementia ([place]) --stable as an inpatient, progressively worsening over the last year per family.   --Long discussion with family at bedside by Dr. Sarajane Jews on 6/22 detailing the natural trajectory of Alzheimer's disease including difficulty maintaining hydration and nutrition. --continue donepezil, Namenda and supportive care. --I have discussed with Son [name] on 6/28) raising concerns for patient stability and high risk for hospital readmission and guarded prognosis.   Hypovolemic hypernatremia. -- Resolved; secondary to FTT at facility; was not eating or drinking for over a week.   Elevated transaminase levels, elevated AP, Tbili -- Mild elevation, etiology unclear;  hepatitis panel negative. Could be statin-induced--statin stopped. --follow intermittently.   Permanent atrial fibrillation (HCC) --will continue rivaroxaban   Thrombocytopenia --chronic, follow intermittently --no overt bleeding appreciated.   GOC --difficult case.  2 brothers share power of attorney.  Younger brother desires full CODE STATUS, he is quite aware of implications of CPR.  Palliative care consultation was offered and declined. --Recommending outpatient discussion for goals of care and advance care planning.  Sepsis considered but BC contaminant and urine unimpressive -- Sepsis ruled out -- urine w/ enterococcus; no evidence that it is pathogenic, considered asymptomatic bacteriuria.  Mid coccyx pressure  injury --unstageable --POA --no signs of superimposed infection. --Continue preventive measures.   Subjective:  More alert today; no chest pain, no nausea, no vomiting.  Per nurse take patient able to consume approximately 45% of her breakfast tray.  Physical Exam:  Vitals:   06/26/22 0600 06/26/22 0700 06/26/22 1035 06/26/22 1109  BP: (!) 116/59 114/66 127/69 121/70  Pulse: (!) 110  (!) 104 90  Resp: (!) 32 (!) 27 (!) 21 (!) 24  Temp:  97.7 F (36.5 C) 99.8 F (37.7 C)   TempSrc:  Oral Oral   SpO2: 96% 98% 96% 98%  Weight:      Height:       General exam: Slightly more alert on today's examination; still not moving much or following commands.  Able to answer very simple questions with yes or no.  Denies chest pain, no nausea, no vomiting.  No overnight events. Respiratory system: No using accessory muscles; good oxygen saturation on 2 L supplementation.  No wheezing or crackles appreciated on exam.  Positive scattered rhonchi. Cardiovascular system: Rate controlled, no rubs, no gallops, no JVD. Gastrointestinal system: Abdomen is nondistended, soft and nontender. No organomegaly or masses felt. Normal bowel sounds heard. Central nervous system: Limited examination for lack of cooperation.  No focal deficits appreciated. Extremities: No cyanosis or clubbing. Skin: No petechiae; mid coccyx unstageable pressure injury without superimposed infection appreciated; This was present at time of admission. Psychiatry: Judgement and insight appear impaired due to underlying dementia.   Data Reviewed: Lactic Acid 1.6 BNP 366.8 Basic metabolic panel: Sodium 159, potassium 3.6, chloride 103, BUN 50, creatinine 0.95. CBC: WBCs 8.4, hemoglobin 10.5, platelet count unable to be calculated as platelets are clumped on smear unable to estimate. This with yellow color, clear appearance, specific gravity 1.0 10, negative ketones, negative protein, negative nitrite just trace of leukocyte  esterase.  Family Communication: son [name] by phone.  Disposition: Status is: Inpatient Remains inpatient appropriate because: FTT, poor oral intake  Planned Discharge Destination: Skilled nursing facility  Author: Barton Dubois, MD 06/26/2022 3:25 PM  For on call review [email or web address].

## 2022-06-26 NOTE — Plan of Care (Signed)
  Problem: Safety: Goal: Ability to remain free from injury will improve Outcome: Progressing   Problem: Respiratory: Goal: Ability to maintain adequate ventilation will improve Outcome: Not Progressing

## 2022-06-26 NOTE — Progress Notes (Signed)
Speech Language Pathology Treatment: Dysphagia  Patient Details Name: [name] Dyer MRN: [id number] DOB: 02-Sep-1932 Today's Date: 06/26/2022 Time: 5364-6803 SLP Time Calculation (min) (ACUTE ONLY): 11 min  Assessment / Plan / Recommendation Clinical Impression  Pt seen for ongoing dysphagia management.  RN reports oral holding requiring suction overnight.  CNA reports good PO intake this morning with pureed diet when given extra time.  Asleep on SLP arrival.  Roused with minimal responsiveness.  Eyes closed throughout majority of evaluation.  Pt accepted PO trials of puree and thin liquid, siphoning through straw. It was difficulty to visualize oral cavity as pt would only open mouth minimially for trials.  There was oral holding on first trial of puree.  Circumlaryngeal massage was note beneficial to intiate swallow.  Reintroducing empty spoon did not prompt swallow response.  Pt swallowed when given straw sip of water.  Following this first trial, swallow initiation occurred spontaneously with PO trials.  Alternating puree and liquids appears to be beneficial.  There was mild anterior loss from L side of labial seal with thin liquid. At end of session SLP introduced suction and a small amount of oral residue was cleared, seemingly diffuse.  Pt continued with only minimal opening of oral cavity.  Pt appears safe to continue current diet.  More advanced textures were not trialed today 2/2 oral deficits, which are seemingly cognitive in nature.  Pt may not be able to meet nutritional needs orally, even on modified diet.  It pt is exhibiting oral holding despite compensatory strategies, recommend deferring meal to when pt is more alert and engaged with feeding.  Recommend continuing puree diet with thin liquids.    HPI HPI: Mrs. Beal is an 88 yo female who was admitted to the hospital with the working diagnosis of acute metabolic encephalopathy. Pt with the past medical history of atrial  fibrillation, dementia, chronic heart failure, CKD stage 3b, recent hospitalization for ESBL Klebsiella urine infection (05/29/22 to 06/09/22). Patient was discharged to SNF with severe deconditioning. Post discharge patient had very poor oral intake, increase 02 requirements for 2 to 3 days and fever the day prior to rehospitalization.  CXR 6/19: "1. Cardiomegaly with vascular congestion bilateral pleural  effusions. Airspace disease at the bases could be due to atelectasis  or pneumonia  2. Hazy asymmetric opacity right thorax could be due to pleural  effusion versus asymmetric edema". Pt was discharged from speech on a Dys 3 diet. Re-ordered today due to significant pocketing.      SLP Plan  Continue with current plan of care      Recommendations for follow up therapy are one component of a multi-disciplinary discharge planning process, led by the attending physician.  Recommendations may be updated based on patient status, additional functional criteria and insurance authorization.    Recommendations  Diet recommendations: Dysphagia 1 (puree);Thin liquid  Liquids provided via: Straw  Medication Administration: Crushed with puree  Supervision: Trained caregiver to feed patient  Compensations:  Minimize environmental distractions; Slow rate; Small sips/bites; Lingual sweep for clearance of pocketing; Monitor for anterior loss; Follow solids with liquid; Introduce empty spoon to prompt swallow initiation as needed;  Have suction available to clear oral residue or pocketing  Postural Changes and/or Swallow Maneuvers: Seated upright 90 degrees               Oral Care Recommendations: Oral care BID Follow Up Recommendations: Skilled nursing-short term rehab (<3 hours/day) Assistance recommended at discharge: Frequent or constant Supervision/Assistance SLP  Visit Diagnosis: Dysphagia, oral phase (R13.11) Plan: Continue with current plan of care      Celedonio Savage, [place],  [place] Office: [phone] 06/26/2022, 10:59 AM

## 2022-06-26 NOTE — Progress Notes (Signed)
Portable equipment department paged for specialty bed.

## 2022-06-26 NOTE — Progress Notes (Signed)
06/25/22 2215  Assess: MEWS Score  BP 128/61  MAP (mmHg) 80  Pulse Rate (!) 110  ECG Heart Rate (!) 113  Resp (!) 29  SpO2 96 %  Assess: MEWS Score  MEWS Temp 0  MEWS Systolic 0  MEWS Pulse 2  MEWS RR 2  MEWS LOC 0  MEWS Score 4  MEWS Score Color Red  Assess: if the MEWS score is Yellow or Red  Were vital signs taken at a resting state? Yes  Focused Assessment No change from prior assessment  Does the patient meet 2 or more of the SIRS criteria? Yes  Does the patient have a confirmed or suspected source of infection? Yes  Provider and Rapid Response Notified? Yes  Treat  Faces Pain Scale 4  Pain Location Leg  Pain Orientation Right  Pain Descriptors / Indicators Moaning  Pain Frequency Intermittent  Pain Onset With Activity  Pain Intervention(s) Medication (See eMAR)  Take Vital Signs  Increase Vital Sign Frequency  Red: Q 1hr X 4 then Q 4hr X 4, if remains red, continue Q 4hrs  Escalate  MEWS: Escalate Red: discuss with charge nurse/RN and provider, consider discussing with RRT  Notify: Charge Nurse/RN  Name of Charge Nurse/RN Notified [place], RN  Date Charge Nurse/RN Notified 06/26/22  Time Charge Nurse/RN Notified 2215  Notify: Provider  Provider Name/Title [place], MD  Date Provider Notified 06/25/22  Time Provider Notified 2215  Method of Notification Page  Notification Reason Change in status  Provider response See new orders  Date of Provider Response 06/25/22  Time of Provider Response 2216  Notify: Rapid Response  Name of Rapid Response RN Notified Jarrett Soho, RN  Date Rapid Response Notified 06/26/22  Time Rapid Response Notified 0015  Document  Patient Outcome Stabilized after interventions  Progress note created (see row info) Yes  Assess: SIRS CRITERIA  SIRS Temperature  0  SIRS Pulse 1  SIRS Respirations  1  SIRS WBC 0  SIRS Score Sum  2

## 2022-06-26 NOTE — Consult Note (Addendum)
WOC Nurse Consult Note: Consult received for mattress replacement with low air loss feature.  Patient seen by my associate, [name] Dyer last week for the Unstageable pressure injury to the coccyx in the presence of incontinence (fecal). See her noted from that encounter. Turning and repositioning is in place per house protocol, however incontinence is contributing to microclimate issues.  Agree with provision of mattress replacement as well as bilateral pressure redistribution heel boots and those orders are placed today.  [place] nursing team will not follow, but will remain available to this patient, the nursing and medical teams.  Please re-consult if needed.  Thank you for inviting [place] to participate in this patient's Plan of Care.  [name] Flakes, MSN, RN, CNS, [place], Serita Grammes, [employer], [employer] phone:  [phone]

## 2022-06-26 NOTE — Progress Notes (Deleted)
{  Select_TRH_Note:[id number]}

## 2022-06-26 NOTE — Progress Notes (Signed)
Pt. Noted to have elevated HR and respirations. Axillary temp also elevated. Family at bedside. On call MD for [facility] paged to make aware. Blood pressure 107/73, pulse (!) 115, temperature 100.3 F (37.9 C), temperature source Axillary, resp. rate (!) 32, height _0  (1.549 m), weight 65.3 kg, SpO2 96 %.

## 2022-06-26 NOTE — TOC Progression Note (Signed)
Transition of Care [facility]) - Progression Note    Patient Details  Name: [name] Dyer MRN: [id number] Date of Birth: 02/11/1932  Transition of Care [facility]) CM/SW [place], [place] Phone Number: 06/26/2022, 1:27 PM  Clinical Narrative:     Josem Kaufmann was started yesterday 06/26/22 at 10:20. [employer] sent message requesting PT documentation on pt's prior level of functioning and prior living situation. CSW had sent all available notes from this admission. CSW uploaded PT eval from pt's previous admission that is more detailed in PLOF and living situation; uploaded document to [employer] portal yesterday afternoon at 15:27.   CSW called [employer] at 10:02 today. Navi rep. Confirmed that documents had been received. Rep. Stated they will message auth coordinator assigned to pt's auth notifying them of documents being uploaded.   1330: Auth still pending at this time. CSW called [employer] and confirmed again that documents were received and that Josem Kaufmann is still "under review"  Expected Discharge Plan: [place] Barriers to Discharge: Continued Medical Work up, Ship broker, SNF Pending bed offer  Expected Discharge Plan and Services Expected Discharge Plan: [place] arrangements for the past 2 months: [place], Single Family Home                                       Social Determinants of Health (SDOH) Interventions    Readmission Risk Interventions    05/31/2022    5:13 PM  Readmission Risk Prevention Plan  Transportation Screening Complete  Medication Review (RN Care Manager) Referral to Pharmacy  PCP or Specialist appointment within 3-5 days of discharge Complete  HRI or Home Care Consult Complete  SW Recovery Care/Counseling Consult Complete  [place] Complete

## 2022-06-26 NOTE — Progress Notes (Signed)
Patient has a blister on the R arm, weeping on the R arm. Blister split open. Measurements 2L x 0.5W x 0.1D. RN placed 4x4 gauze and wrapped R arm with kerlex gauze.

## 2022-06-27 ENCOUNTER — Other Ambulatory Visit: Payer: Self-pay | Admitting: Family

## 2022-06-27 ENCOUNTER — Other Ambulatory Visit: Payer: Self-pay | Admitting: Internal Medicine

## 2022-06-27 DIAGNOSIS — G309 Alzheimer's disease, unspecified: Secondary | ICD-10-CM | POA: Diagnosis not present

## 2022-06-27 DIAGNOSIS — J9601 Acute respiratory failure with hypoxia: Secondary | ICD-10-CM | POA: Diagnosis not present

## 2022-06-27 DIAGNOSIS — G9340 Encephalopathy, unspecified: Secondary | ICD-10-CM | POA: Diagnosis not present

## 2022-06-27 DIAGNOSIS — N179 Acute kidney failure, unspecified: Secondary | ICD-10-CM | POA: Diagnosis not present

## 2022-06-27 NOTE — TOC Progression Note (Addendum)
Transition of Care [facility]) - Progression Note    Patient Details  Name: [name] Dyer MRN: [id number] Date of Birth: 04/18/1932  Transition of Care [facility]) CM/SW [place], [place] Phone Number: 06/27/2022, 11:42 AM  Clinical Narrative:     Josem Kaufmann still pending as of 9am  1000: CSW called [employer] for update and resolve any barriers regarding pending auth. CSW explained situation and Navi rep stated documents have been uploaded and is still showing as under review. She explained most of their system was down yesterday which is causing delays. CSW emphasized urgency. Lynnell Grain is the reviewer for pt's auth request but she is on a break so CSW was transferred to another Web designer. This staff person reviewed pending auth and stated that one of the documents is not showing. CSW had [employer] portal open with documents show uploaded and provided the information that was reflected on their auth system. She still stated she could not find it. She explained that theyre system was down most of yesterday which is causing issues. She requests CSW email documents to [place] at [email or web address]  and stated she would notify [place] as well. CSW emailed original documents as well as additional documents from yesterday and today. Auth still pending.   1302: Received call from [place] with [place]. She states that after reviewing Josem Kaufmann, she will need to submit auth to Psychologist, occupational for review. Their concern is that pt cannot participate enough in therapies. Medical Director may request peer review.   1410: CSW met with pt son [name] and explained that Josem Kaufmann is going for reviewing by Market researcher. CSW explained that there is a high likelihood that Josem Kaufmann would be denied. [name] is interested in LTC; CSW explained hospital could not hold pt while waiting for LTC to be arranged. [name] then explained that [facility] had spoken with them about having a long term bed. [name] states  that [name] that would cover a partial amount of SNF and that they would pay the difference.   CSW called [facility] liaison who confirmed they have a LTC bed but that LTC insurance would be a longer process to arrange. Liaison explained family could private pay initially until LTC insurance was able to cover stay. Liaison will cal pt's son [name] to discuss potential private pay.   Expected Discharge Plan: [place] Barriers to Discharge: Continued Medical Work up, Ship broker, SNF Pending bed offer  Expected Discharge Plan and Services Expected Discharge Plan: [place] arrangements for the past 2 months: [place], Single Family Home                                       Social Determinants of Health (SDOH) Interventions    Readmission Risk Interventions    05/31/2022    5:13 PM  Readmission Risk Prevention Plan  Transportation Screening Complete  Medication Review (RN Care Manager) Referral to Pharmacy  PCP or Specialist appointment within 3-5 days of discharge Complete  HRI or Home Care Consult Complete  SW Recovery Care/Counseling Consult Complete  [place] Complete

## 2022-06-27 NOTE — Progress Notes (Addendum)
Progress Note   Patient: [name] Dyer HYI:[id number] DOB: 1932/02/06 DOA: 06/14/2022     11 DOS: the patient was seen and examined on 06/27/2022   Brief hospital course: 88 year old woman presented with fever and lethargy, admitted for acute metabolic encephalopathy, acute hypoxia, AKI, SIRS. Infection ruled out; treated for marked dehydration secondary to FTT secondary to advanced dementia with clinical improvement and improvement in mentation.  Hospitalization prolonged by mild volume overload nearly resolved.  Main issue now is advanced dementia, failure to thrive and poor oral intake.  Calorie counting in process.  Return to SNF probably in the next 48 hours.  Assessment and Plan: * Acute encephalopathy -Multifactorial:including dehydration, hypoxia, hypernatremia.  UTI ruled out. --Superimposed on dementia. Likely at baseline at this point --further improvement in short-term not expected. --High risk rehospitalization from failure to thrive. -- Advance care planning and goals of care discussion after discharge extremely recommended.   AKI superimposed on stage 3b chronic kidney disease (HCC) --baseline ~1.1; BUN chronically elevated; creatinine back to baseline after IVF's --Continue to check renal function trend intermittently.  Anasarca/acute on chronic diastolic HF -secondary to hypoalbuminemia and component chronic diastolic CHF. --third-spacing; likely nearing balance between peripheral edema and intravascular volume --continue Lasix; dose adjusted.  Orthostatic hypotension --was orthostatic w/ PT; per son had several falls before prior previous hospitalization; suspect long-standing. -continue TED hose and Consider abdominal binder if needed. -Stable blood pressure at rest.   Acute urinary retention --foley placed, given need for multiple I/O caths and advanced dementia, will continue --attempt voiding trial and follow ability to urinate on her own.   Acute hypoxia  (HCC) --likely secondary to shallow inspirations, atelectasis, immobility and possible component of diastolic CHF exacerbation..  --Chest x-ray showed atelectasis. No signs or symptoms of infection.  -Stable saturation on 2L [place].  Dysphagia -- Appreciate assistance and evaluation by speech therapy service; dysphagia 1 with thin liquids recommended.   FTT --very poor oral intake.  --per Calorie count patient consuming around [id number] calories inconsistently. --long-term prognosis guarded --advance care planning and [place] discussion recommended as an outpatient.   Alzheimer's dementia ([place]) --stable as an inpatient, progressively worsening over the last year per family.   --Long discussion with family at bedside by Dr. Sarajane Jews on 6/22 detailing the natural trajectory of Alzheimer's disease including difficulty maintaining hydration and nutrition. --continue donepezil, Namenda and supportive care. --I have discussed with Son [name] on 6/28) raising concerns for patient stability and high risk for hospital readmission and guarded prognosis.   Hypovolemic hypernatremia. -- Resolved; secondary to FTT at facility; was not eating or drinking for over a week.   Elevated transaminase levels, elevated AP, Tbili -- Mild elevation, etiology unclear;  hepatitis panel negative. Could be statin-induced--statin stopped. --follow intermittently.   Permanent atrial fibrillation (HCC) --will continue rivaroxaban   Thrombocytopenia --chronic, follow intermittently --no overt bleeding appreciated.   GOC --difficult case.  2 brothers share power of attorney.  Younger brother desires full CODE STATUS, he is quite aware of implications of CPR.  Palliative care consultation was offered and declined. --Recommending outpatient discussion for goals of care and advance care planning.  Sepsis considered but BC contaminant and urine unimpressive -- Sepsis ruled out -- urine w/ enterococcus; no evidence that  it is pathogenic, considered asymptomatic bacteriuria.  Mid coccyx pressure injury --unstageable --POA --no signs of superimposed infection. --Continue preventive measures.   Subjective:  Chronically ill, no chest pain, no nausea, no vomiting.  No overnight event reported.  Sleepy  today during examination.  Continues to demonstrate limited oral intake.  Physical Exam: Vitals:   06/27/22 0600 06/27/22 0800 06/27/22 0815 06/27/22 0831  BP: 113/67 113/76 113/68   Pulse:  (!) 108 86   Resp: 20 20 (!) 28 19  Temp:  97.6 F (36.4 C) (!) 97 F (36.1 C)   TempSrc:  Axillary Axillary   SpO2:  100% 96%   Weight:      Height:       General exam: Sleepy; in no acute distress.  No overnight events.  Oral intake continues to be limited. Respiratory system: Good saturation on 2 L supplementation; no using accessory muscle. Cardiovascular system:Rate controlled, no rubs, no gallops, no JVD on exam. Gastrointestinal system: Abdomen is nondistended, soft and nontender. No organomegaly or masses felt. Normal bowel sounds heard. Central nervous system: Limited examination due to lack of cooperation on today's exam. Extremities: No cyanosis or clubbing, trace edema appreciated bilaterally. Skin: No petechiae.  Mid coccyx unstageable pressure injury present at time of admission without signs of superimposed infection. Psychiatry: Mood is stable.   Data Reviewed: most recent data reviewed. No new labs today. Will order basic labs for tomorrow.  Lactic Acid 1.6 BNP 203.5 Basic metabolic panel: Sodium 597, potassium 3.6, chloride 103, BUN 50, creatinine 0.95. CBC: WBCs 8.4, hemoglobin 10.5, platelet count unable to be calculated as platelets are clumped on smear unable to estimate. This with yellow color, clear appearance, specific gravity 1.0 10, negative ketones, negative protein, negative nitrite just trace of leukocyte esterase.  Family Communication: son [name] by phone on 6/28; no family at  bedsid eon today's exam..  Disposition: Status is: Inpatient Remains inpatient appropriate because: FTT, poor oral intake  Planned Discharge Destination: Skilled nursing facility  Author: Barton Dubois, MD 06/27/2022 4:57 PM  For on call review [email or web address].

## 2022-06-27 NOTE — Care Management Important Message (Signed)
Important Message  Patient Details  Name: [name] Dyer MRN: [id number] Date of Birth: [birth year]   Medicare Important Message Given:  Yes     Shelda Altes 06/27/2022, 8:31 AM

## 2022-06-27 NOTE — TOC Progression Note (Signed)
Transition of Care [facility]) - Progression Note    Patient Details  Name: BEONKA AMESQUITA MRN: [id number] Date of Birth: 19-May-1932  Transition of Care [facility]) CM/SW [place], [place] Phone Number: 06/27/2022, 4:02 PM  Clinical Narrative:     CSW uploaded PT/OT notes from today to [place]. Emailed notes to [employer] as well.   CSW received call from pt son [name]. [name] was aware of possible denial. States he would like to appeal if auth denied as he feels pt is capable of participating and improving from PT/OT.   Psychologist, occupational decision still pending.     Expected Discharge Plan: [place] Barriers to Discharge: Continued Medical Work up, Ship broker, SNF Pending bed offer  Expected Discharge Plan and Services Expected Discharge Plan: [place] arrangements for the past 2 months: [place], Single Family Home                                       Social Determinants of Health (SDOH) Interventions    Readmission Risk Interventions    05/31/2022    5:13 PM  Readmission Risk Prevention Plan  Transportation Screening Complete  Medication Review (RN Care Manager) Referral to Pharmacy  PCP or Specialist appointment within 3-5 days of discharge Complete  HRI or Home Care Consult Complete  SW Recovery Care/Counseling Consult Complete  [place] Complete

## 2022-06-27 NOTE — Progress Notes (Signed)
Occupational Therapy Treatment Patient Details Name: KAMILLE TOOMEY MRN: [id number] DOB: 1932-12-16 Today's Date: 06/27/2022   History of present illness Pt is an 88 y/o female admitted from SNF secondary to sepsis from UTI and acute metabolic encephalopathy. PMH includes dementia, CHF, a fib, HTN, CKD, and CHF.   OT comments  Patient positioned in bed with HOB raised and pillows under RUE elbow to allow for self feeding. Patient was hand over hand to wash face prior to addressing self feeding. Patient required hand over hand assist to feed self and was able to grasp utensil but unable to hold cup. Patient required hand over hand to guide utensil towards mouth with patient activity attempting to assist. Patient was able to eat all of her magic cup before becoming fatigued. Family stated they will continue to feed patient. Acute OT to continue to follow.    Recommendations for follow up therapy are one component of a multi-disciplinary discharge planning process, led by the attending physician.  Recommendations may be updated based on patient status, additional functional criteria and insurance authorization.    Follow Up Recommendations  Skilled nursing-short term rehab (<3 hours/day)    Assistance Recommended at Discharge Frequent or constant Supervision/Assistance  Patient can return home with the following  Two people to help with walking and/or transfers;Two people to help with bathing/dressing/bathroom;Assistance with feeding;Assist for transportation;Direct supervision/assist for medications management   Equipment Recommendations  None recommended by OT    Recommendations for Other Services      Precautions / Restrictions Precautions Precautions: Fall Precaution Comments: advanced dementia Restrictions Weight Bearing Restrictions: No       Mobility Bed Mobility Overal bed mobility: Needs Assistance             General bed mobility comments: total assist to position  in bed for self feeding    Transfers                         Balance                                           ADL either performed or assessed with clinical judgement   ADL Overall ADL's : Needs assistance/impaired Eating/Feeding: Maximal assistance Eating/Feeding Details (indicate cue type and reason): elbow support with pillows and hand over hand assist to feed self with patient activity attempting to feed self but lacks strength.  Able to hold utensil Grooming: Wash/dry face;Maximal assistance Grooming Details (indicate cue type and reason): hand over hand to wipe face before attempting self feeding                               General ADL Comments: Patient seen for self feeding with HOB raised and RUE elbow postioned at elbow to assist    Extremity/Trunk Assessment              Vision       Perception     Praxis      Cognition Arousal/Alertness: Awake/alert Behavior During Therapy: Flat affect Overall Cognitive Status: History of cognitive impairments - at baseline Area of Impairment: Attention, Awareness, Safety/judgement, Following commands                       Following Commands: Follows one step commands inconsistently  General Comments: activity attempted to feed self with commands        Exercises      Shoulder Instructions       General Comments      Pertinent Vitals/ Pain       Pain Assessment Pain Assessment: Faces Faces Pain Scale: No hurt Pain Intervention(s): Monitored during session  Home Living                                          Prior Functioning/Environment              Frequency  Min 2X/week        Progress Toward Goals  OT Goals(current goals can now be found in the care plan section)  Progress towards OT goals: Progressing toward goals  Acute Rehab OT Goals Patient Stated Goal: get better OT Goal Formulation: With family Time  For Goal Achievement: 07/02/22 Potential to Achieve Goals: Poor ADL Goals Pt Will Perform Eating: with min assist;with adaptive utensils Pt Will Perform Grooming: with min assist;bed level  Plan Discharge plan remains appropriate    Co-evaluation                 AM-PAC OT "6 Clicks" Daily Activity     Outcome Measure   Help from another person eating meals?: Total Help from another person taking care of personal grooming?: Total Help from another person toileting, which includes using toliet, bedpan, or urinal?: Total Help from another person bathing (including washing, rinsing, drying)?: Total Help from another person to put on and taking off regular upper body clothing?: Total Help from another person to put on and taking off regular lower body clothing?: Total 6 Click Score: 6    End of Session    OT Visit Diagnosis: Other abnormalities of gait and mobility (R26.89);Muscle weakness (generalized) (M62.81);Other symptoms and signs involving cognitive function;Pain   Activity Tolerance Patient tolerated treatment well   Patient Left in bed;with call bell/phone within reach;with bed alarm set;with family/visitor present   Nurse Communication Other (comment) (% of meal consumed)        Time: 6720-9470 OT Time Calculation (min): 37 min  Charges: OT General Charges $OT Visit: 1 Visit OT Treatments $Self Care/Home Management : 23-37 mins  Lodema Hong, [place]  [employer] 06/27/2022, 2:33 PM

## 2022-06-27 NOTE — Progress Notes (Addendum)
Physical Therapy Treatment Patient Details Name: [name] Dyer MRN: [id number] DOB: 12-16-1932 Today's Date: 06/27/2022   History of Present Illness Pt is an 88 y/o female admitted from SNF secondary to sepsis from UTI and acute metabolic encephalopathy. PMH includes dementia, CHF, a fib, HTN, CKD, and CHF.    PT Comments    Pt remains limited by poor ability to follow commands. Pt does not appear to initiate any AROM this session and remains totalA for all functional mobility. Pt demonstrates no ability to correct balance deviations at this time. [name] son expresses a desire for a chance with mor frequent therapies in an effort to increase stimulation in an effort to improve participation. PT continues to recommend SNF placement at this time.   Prior Level of Function: [name] son reports the patient was able to ambulate limited household distances with use of 4 wheeled walker and SBA-CGA in May prior to recent hospital admission. Pt was able to feed herself, dress herself, requiring assistance for other ADLs.     Recommendations for follow up therapy are one component of a multi-disciplinary discharge planning process, led by the attending physician.  Recommendations may be updated based on patient status, additional functional criteria and insurance authorization.  Follow Up Recommendations  Skilled nursing-short term rehab (<3 hours/day) Can patient physically be transported by private vehicle: No   Assistance Recommended at Discharge Frequent or constant Supervision/Assistance  Patient can return home with the following Two people to help with walking and/or transfers;Assistance with feeding;Assistance with cooking/housework;Assist for transportation;Direct supervision/assist for medications management;Two people to help with bathing/dressing/bathroom;Direct supervision/assist for financial management;Help with stairs or ramp for entrance   Equipment Recommendations  Hospital bed (hoyer  lift)    Recommendations for Other Services       Precautions / Restrictions Precautions Precautions: Fall Precaution Comments: advanced dementia Restrictions Weight Bearing Restrictions: No     Mobility  Bed Mobility Overal bed mobility: Needs Assistance Bed Mobility: Supine to Sit, Sit to Supine     Supine to sit: Total assist, HOB elevated Sit to supine: Total assist, HOB elevated        Transfers                        Ambulation/Gait                   Stairs             Wheelchair Mobility    Modified Rankin (Stroke Patients Only)       Balance Overall balance assessment: Needs assistance Sitting-balance support: No upper extremity supported, Feet supported Sitting balance-Leahy Scale: Zero Sitting balance - Comments: totalA, no righting reactions without support, posterior lean and losses of balance                                    Cognition Arousal/Alertness: Awake/alert Behavior During Therapy: Flat affect Overall Cognitive Status: Difficult to assess                         Following Commands:  (does not follow commands)       General Comments: pt opens eyes but with minimal communication verbally, does not initiate any AROM during session and does not follow commands        Exercises Other Exercises Other Exercises: passive ankle PF stretch one minute hold  bilaterall Other Exercises: 15 reps PROM knee flexion/extension and hip flexion    General Comments General comments (skin integrity, edema, etc.): VSS on 3L [place], no orthostatic hypotension this session      Pertinent Vitals/Pain Pain Assessment Pain Assessment: Faces Faces Pain Scale: Hurts little more Pain Location: generalized with mobility Pain Descriptors / Indicators: Moaning Pain Intervention(s): Monitored during session    Home Living                          Prior Function            PT Goals (current  goals can now be found in the care plan section) Acute Rehab PT Goals Patient Stated Goal: to improve arousal and participation per son Progress towards PT goals: Not progressing toward goals - comment (poor initiation, inability to follow commands)    Frequency    Min 2X/week      PT Plan Current plan remains appropriate    Co-evaluation              AM-PAC PT "6 Clicks" Mobility   Outcome Measure  Help needed turning from your back to your side while in a flat bed without using bedrails?: Total Help needed moving from lying on your back to sitting on the side of a flat bed without using bedrails?: Total Help needed moving to and from a bed to a chair (including a wheelchair)?: Total Help needed standing up from a chair using your arms (e.g., wheelchair or bedside chair)?: Total Help needed to walk in hospital room?: Total Help needed climbing 3-5 steps with a railing? : Total 6 Click Score: 6    End of Session Equipment Utilized During Treatment: Oxygen Activity Tolerance: Patient tolerated treatment well Patient left: in bed;with call bell/phone within reach;with bed alarm set Nurse Communication: Need for lift equipment;Mobility status PT Visit Diagnosis: Other abnormalities of gait and mobility (R26.89);Muscle weakness (generalized) (M62.81);History of falling (Z91.81)     Time: 8592-7639 PT Time Calculation (min) (ACUTE ONLY): 48 min  Charges:  $Therapeutic Exercise: 8-22 mins $Therapeutic Activity: 23-37 mins                     Zenaida Niece, PT, DPT Acute Rehabilitation Office [place] Marthann Abshier 06/27/2022, 3:23 PM

## 2022-06-28 ENCOUNTER — Encounter (HOSPITAL_COMMUNITY): Payer: Self-pay | Admitting: Internal Medicine

## 2022-06-28 DIAGNOSIS — N179 Acute kidney failure, unspecified: Secondary | ICD-10-CM | POA: Diagnosis not present

## 2022-06-28 DIAGNOSIS — G9340 Encephalopathy, unspecified: Secondary | ICD-10-CM | POA: Diagnosis not present

## 2022-06-28 DIAGNOSIS — I5033 Acute on chronic diastolic (congestive) heart failure: Secondary | ICD-10-CM | POA: Diagnosis not present

## 2022-06-28 DIAGNOSIS — J9601 Acute respiratory failure with hypoxia: Secondary | ICD-10-CM | POA: Diagnosis not present

## 2022-06-28 LAB — BASIC METABOLIC PANEL
Anion gap: 9 (ref 5–15)
BUN: 59 mg/dL — ABNORMAL HIGH (ref 8–23)
CO2: 30 mmol/L (ref 22–32)
Calcium: 8.7 mg/dL — ABNORMAL LOW (ref 8.9–10.3)
Chloride: 101 mmol/L (ref 98–111)
Creatinine, Ser: 0.9 mg/dL (ref 0.44–1.00)
GFR, Estimated: >60 mL/min (ref >60)
Glucose, Bld: 111 mg/dL — ABNORMAL HIGH (ref 70–99)
Potassium: 4.2 mmol/L (ref 3.5–5.1)
Sodium: 140 mmol/L (ref 135–145)

## 2022-06-28 LAB — BLOOD CULTURE ID PANEL (REFLEXED) - BCID2

## 2022-06-28 MED ORDER — ORAL CARE MOUTH RINSE: 15.0000 mL | OROMUCOSAL | Status: DC | PRN

## 2022-06-28 MED ORDER — METOPROLOL TARTRATE 25 MG PO TABS
25.0000 mg | ORAL_TABLET | Freq: Two times a day (BID) | ORAL | Status: DC
  Filled 2022-06-28: qty 1

## 2022-06-28 MED ORDER — METOPROLOL TARTRATE 5 MG/5ML IV SOLN
5.0000 mg | Freq: Once | INTRAVENOUS | Status: AC
  Administered 2022-06-28: 5 mg via INTRAVENOUS
  Filled 2022-06-28: qty 5

## 2022-06-28 MED ORDER — ORAL CARE MOUTH RINSE
15.0000 mL | OROMUCOSAL | Status: DC
  Administered 2022-06-28 – 2022-07-06 (×25): 15 mL via OROMUCOSAL

## 2022-06-28 NOTE — Progress Notes (Signed)
PHARMACY - PHYSICIAN COMMUNICATION CRITICAL VALUE ALERT - BLOOD CULTURE IDENTIFICATION (BCID)  [name] Dyer is an 88 y.o. female who presented to [facility] on 06/24/2022 with a chief complaint of fever/AMS  Assessment:   1/2 blood cultures growing Staphylococcus epidermidis.  PCT 0.17  Likely contaminant  Name of physician (or Provider) Contacted:  Dr. Cyd Silence  Current antibiotics:  None  Changes to prescribed antibiotics recommended:  No changes at this time Results for orders placed or performed during the hospital encounter of 06/14/2022  Blood Culture ID Panel (Reflexed) (Collected: 06/26/2022 10:16 PM)  Result Value Ref Range   Enterococcus faecalis NOT DETECTED NOT DETECTED   Enterococcus Faecium NOT DETECTED NOT DETECTED   Listeria monocytogenes NOT DETECTED NOT DETECTED   Staphylococcus species DETECTED (A) NOT DETECTED   Staphylococcus aureus (BCID) NOT DETECTED NOT DETECTED   Staphylococcus epidermidis DETECTED (A) NOT DETECTED   Staphylococcus lugdunensis NOT DETECTED NOT DETECTED   Streptococcus species NOT DETECTED NOT DETECTED   Streptococcus agalactiae NOT DETECTED NOT DETECTED   Streptococcus pneumoniae NOT DETECTED NOT DETECTED   Streptococcus pyogenes NOT DETECTED NOT DETECTED   A.calcoaceticus-baumannii NOT DETECTED NOT DETECTED   Bacteroides fragilis NOT DETECTED NOT DETECTED   Enterobacterales NOT DETECTED NOT DETECTED   Enterobacter cloacae complex NOT DETECTED NOT DETECTED   Escherichia coli NOT DETECTED NOT DETECTED   Klebsiella aerogenes NOT DETECTED NOT DETECTED   Klebsiella oxytoca NOT DETECTED NOT DETECTED   Klebsiella pneumoniae NOT DETECTED NOT DETECTED   Proteus species NOT DETECTED NOT DETECTED   Salmonella species NOT DETECTED NOT DETECTED   Serratia marcescens NOT DETECTED NOT DETECTED   Haemophilus influenzae NOT DETECTED NOT DETECTED   Neisseria meningitidis NOT DETECTED NOT DETECTED   Pseudomonas aeruginosa NOT DETECTED NOT DETECTED    Stenotrophomonas maltophilia NOT DETECTED NOT DETECTED   Candida albicans NOT DETECTED NOT DETECTED   Candida auris NOT DETECTED NOT DETECTED   Candida glabrata NOT DETECTED NOT DETECTED   Candida krusei NOT DETECTED NOT DETECTED   Candida parapsilosis NOT DETECTED NOT DETECTED   Candida tropicalis NOT DETECTED NOT DETECTED   Cryptococcus neoformans/gattii NOT DETECTED NOT DETECTED   Methicillin resistance mecA/C DETECTED (A) NOT DETECTED    Caryl Pina 06/28/2022  4:06 AM

## 2022-06-28 NOTE — Progress Notes (Signed)
   06/28/22 1814  Assess: MEWS Score  Temp 98.1 F (36.7 C)  BP 119/79  MAP (mmHg) 88  Pulse Rate (!) 128  ECG Heart Rate (!) 128  Resp (!) 24  Level of Consciousness Alert  SpO2 95 %  O2 Device Nasal Cannula  O2 Flow Rate (L/min) 3 L/min  Assess: MEWS Score  MEWS Temp 0  MEWS Systolic 0  MEWS Pulse 2  MEWS RR 1  MEWS LOC 0  MEWS Score 3  MEWS Score Color Yellow  Assess: if the MEWS score is Yellow or Red  Were vital signs taken at a resting state? Yes  Focused Assessment Change from prior assessment (see assessment flowsheet)  Does the patient meet 2 or more of the SIRS criteria? No  Does the patient have a confirmed or suspected source of infection? Yes  Provider and Rapid Response Notified? No  MEWS guidelines implemented *See Row Information* No, previously yellow, continue vital signs every 4 hours  Take Vital Signs  Increase Vital Sign Frequency  Yellow: Q 2hr X 2 then Q 4hr X 2, if remains yellow, continue Q 4hrs  Notify: Provider  Provider Name/Title Dr Cathlean Sauer  Date Provider Notified 06/28/22  Time Provider Notified [id number]  Method of Notification Page (secure chat)  Notification Reason Other (Comment) (increased HR)  Provider response See new orders  Date of Provider Response 06/28/22  Time of Provider Response 1835  Document  Patient Outcome Not stable and remains on department  Progress note created (see row info) Yes  Assess: SIRS CRITERIA  SIRS Temperature  0  SIRS Pulse 1  SIRS Respirations  1  SIRS WBC 0  SIRS Score Sum  2   Afib rate 120-150`s MD made aware. IV metoprolol ordered.  Patient is being fed dinner by family member. Noted increased coughing with thin liquids. Also pocketing food. Suctioned food stuff orally.

## 2022-06-28 NOTE — Assessment & Plan Note (Signed)
Unable to stage coccyx pressure ulcer.  Present on admission,

## 2022-06-28 NOTE — Assessment & Plan Note (Signed)
Chronic anemia with Hgb at 10.3 Thrombocytopenia has improved to 150, Wbc is 10,8

## 2022-06-28 NOTE — Assessment & Plan Note (Signed)
Patient has a foley cathter in place due to urinary retention, not present on admission.

## 2022-06-28 NOTE — Progress Notes (Signed)
Progress Note   Patient: [name] Dyer [id number]:[id number] DOB: 02-09-1932 DOA: 06/07/2022     12 DOS: the patient was seen and examined on 06/28/2022   Brief hospital course: [name] Dyer was admitted to the hospital with the working diagnosis of acute metabolic encephalopathy.    88 yo female with the past medical history of atrial fibrillation, dementia, chronic heart failure, CKD stage 3b, recent hospitalization for ESBL Klebsiella urine infection (05/29/22 to 06/09/22). Patient was discharged to SNF with severe deconditioning. Post discharge patient had very poor oral intake, increase 02 requirements for 2 to 3 days and fever the day prior to rehospitalization.On her initial physical examination her blood pressure was 136/75, HR 117, RR 30 and 02 saturation 98%, lungs with rales on the right side, heart with S1 and S2 present and tachycardic with no gallops, or rubs, abdomen soft and not distended, positive bilateral lower extremity edema.    Na 157, K 3,7 CL 121, bicarbonate 28, glucose 116, bun 40 cr 1,37  AST 99 ALT 84 BNP 1,588 Wbc 11,2, hgb 12,1 plt clumped.  Sars covid 19 negative    Urine analysis with positive nitrate, wbc >50, SG 1,010 Urine Na 132.   Chest radiograph with cardiomegaly and moderate right pleural effusion, small left pleural effusion.    EKG 111 bpm, right axis deviation, right bundle branch block, atrial fibrillation rhythm with no significant ST segment or T wave changes.    Patient was placed on antibiotic therapy and IV fluids.   Infection ruled out; treated for marked dehydration secondary to FTT secondary to advanced dementia with clinical improvement and improvement in mentation.   Hospitalization prolonged by mild volume overload nearly resolved.   Main issue now is advanced dementia, failure to thrive and poor oral intake.  Calorie counting in process.  Plan to return to SNF.    Assessment and Plan: * Acute encephalopathy Acute metabolic  encephalopathy with delirium, likely multifactorial, dehydration, hypoxemia, hypernatremia and poor nutrition.   Patient with prolonged hospitalization. Continue to be very weak and deconditioned, plan to transition to SNF.   Acute renal failure superimposed on stage 3b chronic kidney disease (HCC) Hypernatremia. Hypokalemia.   Hypovolemic hyponatremia.  Electrolytes have been corrected. Patient continue with poor oral intake.  Renal function today with serum cr at 0,90 with K at 4,2 and serum bicarbonate at 30.  Continue close monitoring of renal function and electrolytes. Encourage po intake.     Elevated transaminase level --trending down; possibly secondary to CHF  Sepsis due to gram-negative UTI (HCC) Severe sepsis, present on admission, end organ .  Positive Klebsiella ESBL 06/01 Enterococcus faecalis on this admission 10,000 CFU.   Urine infection has been ruled out and antibiotic therapy has been discontinued.   Acute on chronic diastolic CHF (congestive heart failure) (HCC) Echocardiogram from 12/2021 with preserved LV systolic function but mild reduction in RV systolic function.   Peripheral edema possibly related to poor oncotic pressure.       Acute respiratory failure with hypoxia (HCC) Right pleural effusion.   Stable oxygenation, today she is on 3 L/min per [place] with 02 saturation 100%  Alzheimer's disease ([place]) Patient very deconditioned Advanced dementia.   Hypertension On atenolol for blood pressure control.   Dyslipidemia, continue statin therapy.   Permanent atrial fibrillation (HCC) Continue atenolol for rate control and rivaroxaban for anticoagulation.  Continue telemetry monitoring.   Acute urinary retention Patient has a foley cathter in place due to urinary retention, not present  on admission.   Dysphagia Continue with aspiration precautions.   Orthostatic hypotension Fall precautions.   Pressure injury of skin Unable to stage  coccyx pressure ulcer.  Present on admission,   Thrombocytopenia (HCC) Chronic anemia with Hgb at 10.3 Thrombocytopenia has improved to 150, Wbc is 10,8         Subjective: Patient continue to be very weak and deconditioned, poor oral intake.   Physical Exam: Vitals:   06/28/22 0503 06/28/22 1015 06/28/22 1030 06/28/22 1207  BP: 124/74 130/77 121/81 103/64  Pulse: (!) 108 (!) 117 97 (!) 112  Resp: 20 (!) 22 (!) 21 20  Temp: 98.8 F (37.1 C)   98.7 F (37.1 C)  TempSrc: Axillary   Axillary  SpO2: 98% 100% 99% 100%  Weight: 64.4 kg     Height:       Neurology hyporeactive ENT With pallor Cardiovascular with S1 and S2 present Respiratory with no rales or wheezing Abdomen not distended Trace lower extremity edema,  Data Reviewed:    Family Communication: I spoke with patient's son at the bedside, we talked in detail about patient's condition, plan of care and prognosis and all questions were addressed.  Disposition: Status is: Inpatient Remains inpatient appropriate because: pending placement at SNF   Planned Discharge Destination: Skilled nursing facility    Author: Tawni Millers, MD 06/28/2022 3:38 PM  For on call review [email or web address].

## 2022-06-28 NOTE — TOC Progression Note (Signed)
Transition of Care [facility]) - Progression Note    Patient Details  Name: HARSHITHA FRETZ MRN: [id number] Date of Birth: 05-Nov-1932  Transition of Care [facility]) CM/SW Contact  Emeterio Reeve, [place] Phone Number: 06/28/2022, 12:17 PM  Clinical Narrative:     Pts Josem Kaufmann is pending.  Expected Discharge Plan: [place] Barriers to Discharge: Continued Medical Work up, Ship broker, SNF Pending bed offer  Expected Discharge Plan and Services Expected Discharge Plan: [place] arrangements for the past 2 months: [place], Single Family Home                                       Social Determinants of Health (SDOH) Interventions    Readmission Risk Interventions    05/31/2022    5:13 PM  Readmission Risk Prevention Plan  Transportation Screening Complete  Medication Review (RN Care Manager) Referral to Pharmacy  PCP or Specialist appointment within 3-5 days of discharge Complete  HRI or [place] Complete  SW Recovery Care/Counseling Consult Complete  [place] Complete   Emeterio Reeve, [place] Social Worker

## 2022-06-28 NOTE — Assessment & Plan Note (Signed)
Continue with aspiration precautions.

## 2022-06-28 NOTE — Assessment & Plan Note (Signed)
Fall precautions.

## 2022-06-28 DEATH — deceased

## 2022-06-29 DIAGNOSIS — I5033 Acute on chronic diastolic (congestive) heart failure: Secondary | ICD-10-CM | POA: Diagnosis not present

## 2022-06-29 DIAGNOSIS — N179 Acute kidney failure, unspecified: Secondary | ICD-10-CM | POA: Diagnosis not present

## 2022-06-29 DIAGNOSIS — J9601 Acute respiratory failure with hypoxia: Secondary | ICD-10-CM | POA: Diagnosis not present

## 2022-06-29 DIAGNOSIS — G9340 Encephalopathy, unspecified: Secondary | ICD-10-CM | POA: Diagnosis not present

## 2022-06-29 LAB — CULTURE, BLOOD (ROUTINE X 2)

## 2022-06-29 LAB — GLUCOSE, CAPILLARY
Glucose-Capillary: 138 mg/dL — ABNORMAL HIGH (ref 70–99)
Glucose-Capillary: 156 mg/dL — ABNORMAL HIGH (ref 70–99)

## 2022-06-29 MED ORDER — DILTIAZEM HCL-DEXTROSE 125-5 MG/125ML-% IV SOLN (PREMIX)
5.0000 mg/h | INTRAVENOUS | Status: DC
  Administered 2022-06-29 – 2022-06-30 (×2): 5 mg/h via INTRAVENOUS
  Administered 2022-06-30: 10 mg/h via INTRAVENOUS
  Administered 2022-07-01: 7.5 mg/h via INTRAVENOUS
  Filled 2022-06-29 (×5): qty 125

## 2022-06-29 MED ORDER — FUROSEMIDE 10 MG/ML IJ SOLN
20.0000 mg | Freq: Once | INTRAMUSCULAR | Status: AC
Start: 2022-06-29 — End: 2022-06-29
  Administered 2022-06-29: 20 mg via INTRAVENOUS

## 2022-06-29 MED ORDER — FUROSEMIDE 10 MG/ML IJ SOLN
INTRAMUSCULAR | Status: AC
  Filled 2022-06-29: qty 2

## 2022-06-29 MED ORDER — METOPROLOL TARTRATE 25 MG PO TABS
25.0000 mg | ORAL_TABLET | Freq: Two times a day (BID) | ORAL | Status: DC
Start: 2022-06-29 — End: 2022-06-29

## 2022-06-29 MED ORDER — DILTIAZEM HCL 25 MG/5ML IV SOLN
15.0000 mg | Freq: Once | INTRAVENOUS | Status: AC
  Administered 2022-06-29: 15 mg via INTRAVENOUS
  Filled 2022-06-29: qty 5

## 2022-06-29 MED ORDER — METOPROLOL TARTRATE 5 MG/5ML IV SOLN
5.0000 mg | Freq: Once | INTRAVENOUS | Status: AC
  Administered 2022-06-29: 5 mg via INTRAVENOUS

## 2022-06-29 MED ORDER — METOPROLOL TARTRATE 5 MG/5ML IV SOLN
INTRAVENOUS | Status: AC
  Filled 2022-06-29: qty 5

## 2022-06-29 MED ORDER — METOPROLOL TARTRATE 25 MG PO TABS
25.0000 mg | ORAL_TABLET | Freq: Two times a day (BID) | ORAL | Status: DC
  Administered 2022-06-29: 25 mg via ORAL
  Filled 2022-06-29: qty 1

## 2022-06-29 MED ORDER — METOPROLOL TARTRATE 25 MG PO TABS
25.0000 mg | ORAL_TABLET | Freq: Once | ORAL | Status: AC
  Administered 2022-06-29: 25 mg via ORAL
  Filled 2022-06-29: qty 1

## 2022-06-29 MED ORDER — METOPROLOL TARTRATE 50 MG PO TABS
50.0000 mg | ORAL_TABLET | Freq: Two times a day (BID) | ORAL | Status: DC
  Administered 2022-06-29 – 2022-07-04 (×10): 50 mg via ORAL
  Filled 2022-06-29 (×12): qty 1

## 2022-06-29 NOTE — Progress Notes (Addendum)
Progress Note   Patient: [name] Dyer [id number]:[id number] DOB: 1932-07-09 DOA: 05/31/2022     13 DOS: the patient was seen and examined on 06/29/2022   Brief hospital course: [name] was admitted to the hospital with the working diagnosis of acute metabolic encephalopathy.    88 yo female with the past medical history of atrial fibrillation, dementia, chronic heart failure, CKD stage 3b, recent hospitalization for ESBL Klebsiella urine infection (05/29/22 to 06/09/22). Patient was discharged to SNF with severe deconditioning. Post discharge patient had very poor oral intake, increase 02 requirements for 2 to 3 days and fever the day prior to rehospitalization.On her initial physical examination her blood pressure was 136/75, HR 117, RR 30 and 02 saturation 98%, lungs with rales on the right side, heart with S1 and S2 present and tachycardic with no gallops, or rubs, abdomen soft and not distended, positive bilateral lower extremity edema.    Na 157, K 3,7 CL 121, bicarbonate 28, glucose 116, bun 40 cr 1,37  AST 99 ALT 84 BNP 1,588 Wbc 11,2, hgb 12,1 plt clumped.  Sars covid 19 negative    Urine analysis with positive nitrate, wbc >50, SG 1,010 Urine Na 132.   Chest radiograph with cardiomegaly and moderate right pleural effusion, small left pleural effusion.    EKG 111 bpm, right axis deviation, right bundle branch block, atrial fibrillation rhythm with no significant ST segment or T wave changes.    Patient was placed on antibiotic therapy and IV fluids.   Infection ruled out; treated for marked dehydration secondary to failure to thrive secondary to advanced dementia with clinical improvement and improvement in mentation.   Hospitalization prolonged by mild volume overload nearly resolved.   Main issue now is advanced dementia, failure to thrive and poor oral intake.  Calorie counting in process.  Plan to return to SNF.    07/01 atrial fibrillation with RVR.  I spoke with her  son about advance directives, for now continue full code.   Assessment and Plan: * Acute encephalopathy Acute metabolic encephalopathy with delirium, likely multifactorial, dehydration, hypoxemia, hypernatremia and poor nutrition.  Progressive dementia, worsening over last 6 months, poor prognosis. I spoke with her son at the bedside about advance directives, for now is full code.   Continue with aspiration precautions.  Pending transfer to SNF.   Acute renal failure superimposed on stage 3b chronic kidney disease (HCC) Hypernatremia. Hypokalemia.  Hypovolemic hyponatremia.  Renal function has been stable and electrolytes have been corrected.  Patient off IV fluids, poor oral intake. Plan to check renal function in am.    Sepsis due to gram-negative UTI (HCC) Severe sepsis, present on admission, end organ .  Positive Klebsiella ESBL 06/01 Enterococcus faecalis on this admission 10,000 CFU.   Urine infection has been ruled out and antibiotic therapy has been discontinued.   Acute on chronic diastolic CHF (congestive heart failure) (HCC) Echocardiogram from 12/2021 with preserved LV systolic function but mild reduction in RV systolic function.   Elevated liver enzymes due to volume overload.   Peripheral edema possibly related to poor oncotic pressure.   Poor oral intake. Continue to hold on furosemide for now.  Continue blood pressure control and rate control atrial fibrillation.      Acute respiratory failure with hypoxia (HCC) Right pleural effusion.   Stable oxygenation, today she is on 3 L/min per [place] with 02 saturation 100%  Alzheimer's disease ([place]) Patient very deconditioned Advanced dementia it has been progressive and possible she  has new baseline.  Continue to address advance directives, may need palliative care consultation.  On donepezil and memantine.   Hypertension Changed atenolol to metoprolol for rate control atrial fibrillation.   Dyslipidemia,  continue statin therapy.   Permanent atrial fibrillation ([place]) Now with rapid ventricular response.   Plan to increase oral metoprolol to 50 mg po bid for rate control. Patient required diltiazem and metoprolol IV yesterday for rapid ventricular response.  Continue anticoagulation with rivaroxaban.   Acute urinary retention Patient has a foley cathter in place due to urinary retention, not present on admission.   Dysphagia Continue with aspiration precautions.   Orthostatic hypotension Fall precautions.   Pressure injury of skin Unable to stage coccyx pressure ulcer.  Present on admission,   Thrombocytopenia (HCC) Chronic anemia with Hgb at 10.3 Thrombocytopenia has improved to 150, Wbc is 10,8         Subjective: Patient very weak and deconditioned, had rapid atrial fibrillation with rapid ventricular response last night. This am continue hyporeactive, eyes closed, her son at the bedside assisting in feedings.   Physical Exam: Vitals:   06/29/22 0000 06/29/22 0100 06/29/22 0400 06/29/22 0800  BP: 110/77  (!) 119/58 109/83  Pulse: (!) 136 89 74 (!) 123  Resp: (!) 33 (!) 32 (!) 34 (!) 24  Temp:    98.5 F (36.9 C)  TempSrc:    Axillary  SpO2: 96% 93% 93% 96%  Weight:   69.9 kg   Height:       Neurology eyes closed and not responsive to commands, she is eating with assistance of her son, not talk ENT mild pallor Cardiovascular S1 and S2 present, irregularly irregular tachycardic with no gallops.  No JVD No lower extremity edema Respiratory with no rales or wheezing Abdomen not distended  Data Reviewed:    Family Communication: I spoke with patient's son at the bedside, we talked in detail about patient's condition, plan of care and prognosis and all questions were addressed.   Disposition: Status is: Inpatient Remains inpatient appropriate because: atrial fibrillation and encephalopathy   Planned Discharge Destination: Skilled nursing  facility      Author: Tawni Millers, MD 06/29/2022 1:39 PM  For on call review [email or web address].

## 2022-06-29 NOTE — Progress Notes (Addendum)
   06/29/22 1540  Assess: MEWS Score  BP 116/66  MAP (mmHg) 81  Pulse Rate (!) 152  ECG Heart Rate (!) 152  Resp (!) 34  Level of Consciousness Responds to Pain  SpO2 91 %  O2 Device Nasal Cannula  O2 Flow Rate (L/min) 4 L/min  Assess: MEWS Score  MEWS Temp 0  MEWS Systolic 0  MEWS Pulse 3  MEWS RR 2  MEWS LOC 2  MEWS Score 7  MEWS Score Color Red  Assess: if the MEWS score is Yellow or Red  Were vital signs taken at a resting state? Yes  Focused Assessment No change from prior assessment  Does the patient meet 2 or more of the SIRS criteria? No  Does the patient have a confirmed or suspected source of infection? Yes  Provider and Rapid Response Notified? No  MEWS guidelines implemented *See Row Information* No, previously red, continue vital signs every 4 hours  Treat  MEWS Interventions Administered scheduled meds/treatments;Administered prn meds/treatments  Breathing 1  Negative Vocalization 1  Facial Expression 0  Body Language 1  Consolability 0  PAINAD Score 3  Escalate  MEWS: Escalate Yellow: discuss with charge nurse/RN and consider discussing with provider and RRT  Notify: Charge Nurse/RN  Name of Charge Nurse/RN Notified Beverlee Nims RN  Date Charge Nurse/RN Notified 06/29/22  Time Charge Nurse/RN Notified 1600  Notify: Provider  Provider Name/Title Dr Cathlean Sauer  Date Provider Notified 06/29/22  Time Provider Notified 41  Method of Notification  (secure chat)  Notification Reason Other (Comment) (increased HR RR)  Provider response At bedside  Date of Provider Response 06/29/22  Time of Provider Response 1600  Document  Patient Outcome Not stable and remains on department  Progress note created (see row info) Yes  Assess: SIRS CRITERIA  SIRS Temperature  0  SIRS Pulse 1  SIRS Respirations  1  SIRS WBC 0  SIRS Score Sum  2   Patient condition is critical. Afib RVR 120-150`s RR 30`s moaning groaning and grunting. Informed MD received orders for metoprolol  and lasix once. To start on diltiazem drip. MD spoke with patient son to consider changing to DNR

## 2022-06-29 NOTE — Progress Notes (Addendum)
Atrial fibrillation with RVR.   Plan to add IV metoprolol 5 mg one dose, if persistent atrial fibrillation with RVR will start diltiazem drip. Add 20 mg IV furosemide for acute diastolic heart failure.   Patient with worsening clinical condition, encephalopathy and delirium, likely driving uncontrolled atrial fibrillation.  Poor prognosis.  I spoke with her son at the bedside and explained patient's worsening condition and risk for further deterioration.  Severe delirium in the setting of progressive dementia.  Asked to addressed advance directives. He will talk to his brother and let the care team know.

## 2022-06-29 NOTE — Progress Notes (Signed)
Pt current MEWS of 4 was documented as yellow previously, but was 7 before, so it was red, presumably. Heart rate decreased to normal rate with cardizem, however RR still 30s and patient responds to pain.  Will continue to monitor.

## 2022-06-30 DIAGNOSIS — N179 Acute kidney failure, unspecified: Secondary | ICD-10-CM | POA: Diagnosis not present

## 2022-06-30 DIAGNOSIS — J9601 Acute respiratory failure with hypoxia: Secondary | ICD-10-CM | POA: Diagnosis not present

## 2022-06-30 DIAGNOSIS — G9340 Encephalopathy, unspecified: Secondary | ICD-10-CM | POA: Diagnosis not present

## 2022-06-30 DIAGNOSIS — I5033 Acute on chronic diastolic (congestive) heart failure: Secondary | ICD-10-CM | POA: Diagnosis not present

## 2022-06-30 LAB — BASIC METABOLIC PANEL
Anion gap: 8 (ref 5–15)
BUN: 77 mg/dL — ABNORMAL HIGH (ref 8–23)
CO2: 29 mmol/L (ref 22–32)
Calcium: 8.6 mg/dL — ABNORMAL LOW (ref 8.9–10.3)
Chloride: 105 mmol/L (ref 98–111)
Creatinine, Ser: 0.98 mg/dL (ref 0.44–1.00)
GFR, Estimated: 56 mL/min — ABNORMAL LOW (ref >60)
Glucose, Bld: 106 mg/dL — ABNORMAL HIGH (ref 70–99)
Potassium: 5.2 mmol/L — ABNORMAL HIGH (ref 3.5–5.1)
Sodium: 142 mmol/L (ref 135–145)

## 2022-06-30 LAB — CBC
HCT: 33.3 % — ABNORMAL LOW (ref 36.0–46.0)
Hemoglobin: 10.3 g/dL — ABNORMAL LOW (ref 12.0–15.0)
MCH: 28.6 pg (ref 26.0–34.0)
MCHC: 30.9 g/dL (ref 30.0–36.0)
MCV: 92.5 fL (ref 80.0–100.0)
Platelets: UNDETERMINED 10*3/uL (ref 150–400)
RBC: 3.6 MIL/uL — ABNORMAL LOW (ref 3.87–5.11)
RDW: 20.6 % — ABNORMAL HIGH (ref 11.5–15.5)
WBC: 12.2 10*3/uL — ABNORMAL HIGH (ref 4.0–10.5)
nRBC: 0 % (ref 0.0–0.2)

## 2022-06-30 MED ORDER — DILTIAZEM HCL 30 MG PO TABS: 30.0000 mg | ORAL_TABLET | Freq: Four times a day (QID) | ORAL | Status: DC

## 2022-06-30 NOTE — IPAL (Signed)
I called Mr. [name] Dyer, patient's son, we talked about [name] condition with rapid worsening dementia, poor oral intake and having difficulty having her oral medications in a consistent way due to her poor mentation and risk of aspiration.  He confirms change in Code status to DNR.  I have informed him that rehab services have been declined due to [name] not able to participate with physical therapy because her encephalopathy and severe deconditioning.  He would like to look for custodial care at a SNF, and is open to transition to hospice if she continues to deteriorate.  He is in agreement in consulting palliative care team to assist in her care during this hospitalization and assist in further disposition.

## 2022-06-30 NOTE — Progress Notes (Addendum)
Progress Note   Patient: [name] Dyer DVV:[id number] DOB: 05-02-1932 DOA: 06/15/2022     14 DOS: the patient was seen and examined on 06/30/2022   Brief hospital course: [name] was admitted to the hospital with the working diagnosis of acute metabolic encephalopathy.    88 yo female with the past medical history of atrial fibrillation, dementia, chronic heart failure, CKD stage 3b, recent hospitalization for ESBL Klebsiella urine infection (05/29/22 to 06/09/22). Patient was discharged to SNF with severe deconditioning. Post discharge patient had very poor oral intake, increase 02 requirements for 2 to 3 days and fever the day prior to rehospitalization.On her initial physical examination her blood pressure was 136/75, HR 117, RR 30 and 02 saturation 98%, lungs with rales on the right side, heart with S1 and S2 present and tachycardic with no gallops, or rubs, abdomen soft and not distended, positive bilateral lower extremity edema.    Na 157, K 3,7 CL 121, bicarbonate 28, glucose 116, bun 40 cr 1,37  AST 99 ALT 84 BNP 1,588 Wbc 11,2, hgb 12,1 plt clumped.  Sars covid 19 negative    Urine analysis with positive nitrate, wbc >50, SG 1,010 Urine Na 132.   Chest radiograph with cardiomegaly and moderate right pleural effusion, small left pleural effusion.    EKG 111 bpm, right axis deviation, right bundle branch block, atrial fibrillation rhythm with no significant ST segment or T wave changes.    Patient was placed on antibiotic therapy and IV fluids.   Infection ruled out; treated for marked dehydration secondary to failure to thrive secondary to advanced dementia with clinical improvement and improvement in mentation.   Hospitalization prolonged by mild volume overload nearly resolved.   Main issue now is advanced dementia, failure to thrive and poor oral intake.  Calorie counting in process.  Plan to return to SNF.    07/01 atrial fibrillation with RVR.  I spoke with her  son about advance directives, for now continue full code.   07/03 patient with progressive dementia, likely at her new baseline. I spoke with her son this am, and both have decided to change code status to DNR. Patient no longer qualifies to SNF for PT, but she does qualify for residential hospice.  Will talk to the family about hospice option.   Assessment and Plan: * Acute encephalopathy Acute metabolic encephalopathy with delirium, likely multifactorial, dehydration, hypoxemia, hypernatremia and poor nutrition.  Progressive dementia, worsening over last 6 months, poor prognosis. Likely patient has reached a new baseline. Continue with very poor oral intake and not much interactive.   I spoke with her son at the bedside and family has decided to change code status to DNR. She no longer qualifies for SNF for rehab, I spoke did peer to peer review with Market researcher from insurance.   Considering her poor prognosis and rapidly progressive dementia, patient will qualify for residential hospice.  Will check if family about this option.   Acute renal failure superimposed on stage 3b chronic kidney disease (HCC) Hypernatremia. Hypokalemia. Hyperkalemia.  Renal function with serum cr at 0,98, K si 5,2 and serum bicarbonate at 29. Patient not on K supplements or medications that can trigger hyperkalemia.  Possible hemolysis of the sample.   Will check electrolytes in am.  Patient with poor oral intake and poor prognosis.     Sepsis due to gram-negative UTI (HCC) Severe sepsis, present on admission, end organ .  Positive Klebsiella ESBL 06/01 Enterococcus faecalis on this admission  10,000 CFU.   Urine infection has been ruled out and antibiotic therapy has been discontinued.   Acute on chronic diastolic CHF (congestive heart failure) (HCC) Echocardiogram from 12/2021 with preserved LV systolic function but mild reduction in RV systolic function.   Elevated liver enzymes due to  volume overload.   Peripheral edema possibly related to poor oncotic pressure.   Poor oral intake. Continue to hold on furosemide for now.  Continue blood pressure control and rate control atrial fibrillation.      Acute respiratory failure with hypoxia (HCC) Right pleural effusion.   Stable oxygenation, today she is on 3 L/min per [place] with 02 saturation 100%  Alzheimer's disease ([place]) Patient very deconditioned Advanced dementia it has been progressive and possible she has new baseline.   On donepezil and memantine.  Swallow dysfunction, continue with dysphagia 1 diet.   Patient will qualify for hospice.   Hypertension Changed atenolol to metoprolol for rate control atrial fibrillation.   Dyslipidemia, continue statin therapy.   Permanent atrial fibrillation (HCC) Positive RVR.   Required IV diltiazem drip for rate control.  This am rate has improved, continue in atrial fibrillation rhythm.  Plan to continue metoprolol 50 mg po bid Add oral diltiazem 30 mg qid and wean diltiazem infusion to heart rate less than 100 bpm.   Anticoagulation with rivaroxaban.   Acute urinary retention Patient has a foley cathter in place due to urinary retention, not present on admission.   Dysphagia Continue with aspiration precautions.   Orthostatic hypotension Fall precautions.   Pressure injury of skin Unable to stage coccyx pressure ulcer.  Present on admission,   Thrombocytopenia (HCC) Chronic anemia with Hgb at 10.3 Thrombocytopenia has improved to 150, Wbc is 10,8         Subjective: Patient is very weak and deconditioned, has been somnolent and hypo reactive, non verbal and not following commands.   Physical Exam: Vitals:   06/30/22 0000 06/30/22 0500 06/30/22 0504 06/30/22 0600  BP: (!) 104/51 (!) 106/50  (!) 103/55  Pulse: 92 70  93  Resp: (!) _0 Temp:      TempSrc:      SpO2: 96% 96%  96%  Weight:   66.2 kg   Height:       Neurology patient  not responsive to voice or touch, not following commands to my examination. Deconditioned and ill looking appearing ENT with pallor Cardiovascular with S1 and S2 present, irregularly irregular with no gallops No JVD No lower extremity edema Abdomen not distended  Data Reviewed:    Family Communication: I spoke with patient's son at the bedside, we talked in detail about patient's condition, plan of care and prognosis and all questions were addressed.   Disposition: Status is: Inpatient Remains inpatient appropriate because: progressive dementia, metabolic encephalopathy and delirium,   Planned Discharge Destination:  to be determined     Author: Tawni Millers, MD 06/30/2022 1:31 PM  For on call review [email or web address].

## 2022-06-30 NOTE — TOC Progression Note (Signed)
Transition of Care [facility]) - Progression Note    Patient Details  Name: [name] Dyer MRN: [id number] Date of Birth: June 03, 1932  Transition of Care [facility]) CM/SW [place], [place] Phone Number: 06/30/2022, 9:05 AM  Clinical Narrative:     Received voicemail from [place] notifying that pt's Josem Kaufmann is going to peer review. MD to call [phone] and select option 5. Peer review due by 1230pm today. MD notified.   Expected Discharge Plan: [place] Barriers to Discharge: Continued Medical Work up, Ship broker, SNF Pending bed offer  Expected Discharge Plan and Services Expected Discharge Plan: [place] arrangements for the past 2 months: [place], Single Family Home                                       Social Determinants of Health (SDOH) Interventions    Readmission Risk Interventions    05/31/2022    5:13 PM  Readmission Risk Prevention Plan  Transportation Screening Complete  Medication Review (RN Care Manager) Referral to Pharmacy  PCP or Specialist appointment within 3-5 days of discharge Complete  HRI or Home Care Consult Complete  SW Recovery Care/Counseling Consult Complete  [place] Complete

## 2022-06-30 NOTE — TOC Progression Note (Signed)
Transition of Care [facility]) - Progression Note    Patient Details  Name: LYNNELLE MESMER MRN: [id number] Date of Birth: [birth year]  Transition of Care [name]) CM/SW [place], [place] Phone Number: 06/30/2022, 4:27 PM  Clinical Narrative:     CSW received notification that after peer to peer, SNF auth request was still denied. Fast track appeal # is [id number] and fax# is [phone]. MD was to discuss with pt son's and offer CC/hospice.   MD notified CSW that he spoke with pt's sons and pt's son [name] desires custodial care at a SNF. CSW called [name] to discuss this. [name] does explained this is a direction he is interested in but he would need to discuss further with his brother. CSW explained costs would be out of pocket until pt could qualify for medicaid or her LTC insurance kicked in. [name] inquired about pt's DC date; CSW explained pt is medically stable for DC and that a disposition will need to be decided on. CSW will follow up tomorrow after pt's son's discuss further with each other.     Expected Discharge Plan: Skilled Nursing Facility Barriers to Discharge: Family Issues, SNF Authorization Denied  Expected Discharge Plan and Services Expected Discharge Plan: [place] arrangements for the past 2 months: [place], Single Family Home                                       Social Determinants of Health (SDOH) Interventions    Readmission Risk Interventions    05/31/2022    5:13 PM  Readmission Risk Prevention Plan  Transportation Screening Complete  Medication Review (RN Care Manager) Referral to Pharmacy  PCP or Specialist appointment within 3-5 days of discharge Complete  HRI or Home Care Consult Complete  SW Recovery Care/Counseling Consult Complete  [place] Complete

## 2022-07-01 DIAGNOSIS — G9340 Encephalopathy, unspecified: Secondary | ICD-10-CM | POA: Diagnosis not present

## 2022-07-01 DIAGNOSIS — N179 Acute kidney failure, unspecified: Secondary | ICD-10-CM | POA: Diagnosis not present

## 2022-07-01 DIAGNOSIS — G309 Alzheimer's disease, unspecified: Secondary | ICD-10-CM | POA: Diagnosis not present

## 2022-07-01 DIAGNOSIS — J9601 Acute respiratory failure with hypoxia: Secondary | ICD-10-CM | POA: Diagnosis not present

## 2022-07-01 DIAGNOSIS — R338 Other retention of urine: Secondary | ICD-10-CM

## 2022-07-01 LAB — BASIC METABOLIC PANEL
Anion gap: 11 (ref 5–15)
BUN: 92 mg/dL — ABNORMAL HIGH (ref 8–23)
CO2: 29 mmol/L (ref 22–32)
Calcium: 9 mg/dL (ref 8.9–10.3)
Chloride: 101 mmol/L (ref 98–111)
Creatinine, Ser: 1.22 mg/dL — ABNORMAL HIGH (ref 0.44–1.00)
GFR, Estimated: 43 mL/min — ABNORMAL LOW (ref >60)
Glucose, Bld: 130 mg/dL — ABNORMAL HIGH (ref 70–99)
Potassium: 5 mmol/L (ref 3.5–5.1)
Sodium: 141 mmol/L (ref 135–145)

## 2022-07-01 LAB — CULTURE, BLOOD (ROUTINE X 2)
Culture: NO GROWTH
Special Requests: ADEQUATE

## 2022-07-01 MED ORDER — SODIUM CHLORIDE 0.9 % IV SOLN: INTRAVENOUS | Status: AC

## 2022-07-01 MED ORDER — DILTIAZEM HCL 30 MG PO TABS
30.0000 mg | ORAL_TABLET | Freq: Four times a day (QID) | ORAL | Status: DC
  Administered 2022-07-01 – 2022-07-04 (×12): 30 mg via ORAL
  Filled 2022-07-01 (×12): qty 1

## 2022-07-01 NOTE — Progress Notes (Signed)
Physical Therapy Treatment and Discharge Patient Details Name: [name] Dyer MRN: [id number] DOB: 10-Oct-1932 Today's Date: 07/01/2022   History of Present Illness Pt is an 88 y/o female admitted from SNF secondary to sepsis from UTI and acute metabolic encephalopathy. PMH includes dementia, CHF, a fib, HTN, CKD, and CHF.    PT Comments    Pt has not progressed last 2 weeks of therapy, continues to need tot A for all mobility and does not have the ability to participate in sessions. PT has continued to stimulate pt with sitting EOB and ROM but no change noted. PT signing off at this time.      Recommendations for follow up therapy are one component of a multi-disciplinary discharge planning process, led by the attending physician.  Recommendations may be updated based on patient status, additional functional criteria and insurance authorization.  Follow Up Recommendations  Skilled nursing-short term rehab (<3 hours/day) Can patient physically be transported by private vehicle: No   Assistance Recommended at Discharge Frequent or constant Supervision/Assistance  Patient can return home with the following Two people to help with walking and/or transfers;Assistance with feeding;Assistance with cooking/housework;Assist for transportation;Direct supervision/assist for medications management;Two people to help with bathing/dressing/bathroom;Direct supervision/assist for financial management;Help with stairs or ramp for entrance   Equipment Recommendations  Hospital bed (hoyer lift)    Recommendations for Other Services       Precautions / Restrictions Precautions Precautions: Fall Precaution Comments: advanced dementia Restrictions Weight Bearing Restrictions: No     Mobility  Bed Mobility Overal bed mobility: Needs Assistance Bed Mobility: Supine to Sit, Sit to Supine Rolling: Total assist   Supine to sit: Total assist, HOB elevated Sit to supine: Total assist   General bed  mobility comments: total assist to come to sitting position and to return to supine    Transfers                   General transfer comment: unable to attempt with pt not actively participating or following commands    Ambulation/Gait               General Gait Details: unable   Stairs             Wheelchair Mobility    Modified Rankin (Stroke Patients Only)       Balance Overall balance assessment: Needs assistance Sitting-balance support: No upper extremity supported, Feet supported Sitting balance-Leahy Scale: Poor Sitting balance - Comments: mod A needed to maintain sitting due to R and L lean, no initiation of correcting LOB                                    Cognition Arousal/Alertness: Awake/alert Behavior During Therapy: Flat affect Overall Cognitive Status: Difficult to assess Area of Impairment: Attention, Awareness, Safety/judgement, Following commands                       Following Commands:  (does not follow commands)       General Comments: pt opens eyes but with minimal communication verbally, does not initiate any AROM during session and does not follow commands        Exercises General Exercises - Lower Extremity Long Arc Quad: PROM, Both, 10 reps, Seated Other Exercises Other Exercises: passive ankle PF stretch B Other Exercises: B elbow flex/ ext 10 Other Exercises: attempted shoulder flexion but pt moaned and  pulled away so did not continue with this Other Exercises: passive finger extension    General Comments General comments (skin integrity, edema, etc.): VSS throughout mobliity. Had some secretions from mouth which were suctioned. Prevalon boots donned after session.      Pertinent Vitals/Pain Pain Assessment Pain Assessment: Faces Faces Pain Scale: Hurts little more Breathing: normal Negative Vocalization: none Facial Expression: smiling or inexpressive Body Language:  relaxed Consolability: no need to console PAINAD Score: 0 Pain Location: generalized with mobility Pain Descriptors / Indicators: Moaning Pain Intervention(s): Monitored during session    Home Living                          Prior Function            PT Goals (current goals can now be found in the care plan section) Acute Rehab PT Goals Patient Stated Goal: to improve arousal and participation per son PT Goal Formulation: With patient/family Time For Goal Achievement: 07/01/22 Potential to Achieve Goals: Fair Progress towards PT goals: Not progressing toward goals - comment (PT signing off)    Frequency           PT Plan Discharge plan needs to be updated    Co-evaluation              AM-PAC PT "6 Clicks" Mobility   Outcome Measure  Help needed turning from your back to your side while in a flat bed without using bedrails?: Total Help needed moving from lying on your back to sitting on the side of a flat bed without using bedrails?: Total Help needed moving to and from a bed to a chair (including a wheelchair)?: Total Help needed standing up from a chair using your arms (e.g., wheelchair or bedside chair)?: Total Help needed to walk in hospital room?: Total Help needed climbing 3-5 steps with a railing? : Total 6 Click Score: 6    End of Session Equipment Utilized During Treatment: Oxygen Activity Tolerance: Other (comment) (treatment limited secondary to pt cognitive state) Patient left: in bed;with call bell/phone within reach Nurse Communication: Mobility status PT Visit Diagnosis: Other abnormalities of gait and mobility (R26.89);Muscle weakness (generalized) (M62.81);History of falling (Z91.81)     Time: 1222-4114 PT Time Calculation (min) (ACUTE ONLY): 19 min  Charges:  $Therapeutic Activity: 8-22 mins                     Leighton Roach, PT  Acute Rehab Services Secure chat preferred Office [place] 07/01/2022, 4:40 PM

## 2022-07-01 NOTE — TOC Progression Note (Signed)
Transition of Care [facility]) - Progression Note    Patient Details  Name: [name] Dyer MRN: [id number] Date of Birth: 1932-12-27  Transition of Care [facility]) CM/SW [place], [place] Phone Number: 07/01/2022, 3:51 PM  Clinical Narrative:     CSW called pt's son [name] to discuss disposition plan for pt. He expresses that he still feels that she could benefit from rehab and would like to appeal SNF auth denial. CSW explained appeal process which [name] will initiate.   CSW discuss home with private care givers vs LTC SNF if appeal were to be denied as well. [name] indicates that they would most likely want to have pt placed in LTC if appeal denied.   Expected Discharge Plan: Skilled Nursing Facility Barriers to Discharge: Family Issues, SNF Authorization Denied  Expected Discharge Plan and Services Expected Discharge Plan: [place] arrangements for the past 2 months: [place], Single Family Home                                       Social Determinants of Health (SDOH) Interventions    Readmission Risk Interventions    05/31/2022    5:13 PM  Readmission Risk Prevention Plan  Transportation Screening Complete  Medication Review (RN Care Manager) Referral to Pharmacy  PCP or Specialist appointment within 3-5 days of discharge Complete  HRI or Home Care Consult Complete  SW Recovery Care/Counseling Consult Complete  [place] Complete

## 2022-07-01 NOTE — Progress Notes (Signed)
Progress Note   Patient: [name] Dyer:[id number] DOB: 10-28-1932 DOA: 06/02/2022     15 DOS: the patient was seen and examined on 07/01/2022   Brief hospital course: [name] Dyer was admitted to the hospital with the working diagnosis of acute metabolic encephalopathy.    88 yo female with the past medical history of atrial fibrillation, dementia, chronic heart failure, CKD stage 3b, recent hospitalization for ESBL Klebsiella urine infection (05/29/22 to 06/09/22). Patient was discharged to SNF with severe deconditioning. Post discharge patient had very poor oral intake, increase 02 requirements for 2 to 3 days and fever the day prior to her rehospitalization. On her initial physical examination her blood pressure was 136/75, HR 117, RR 30 and 02 saturation 98%, lungs with rales on the right side, heart with S1 and S2 present and tachycardic with no gallops, or rubs, abdomen soft and not distended, positive bilateral lower extremity edema.    Na 157, K 3,7 CL 121, bicarbonate 28, glucose 116, bun 40 cr 1,37  AST 99 ALT 84 BNP 1,588 Wbc 11,2, hgb 12,1 plt clumped.  Sars covid 19 negative    Urine analysis with positive nitrate, wbc >50, SG 1,010 Urine Na 132.   Chest radiograph with cardiomegaly and moderate right pleural effusion, small left pleural effusion.    EKG 111 bpm, right axis deviation, right bundle branch block, atrial fibrillation rhythm with no significant ST segment or T wave changes.    Patient was placed on antibiotic therapy and IV fluids.   Infection ruled out; treated for marked dehydration secondary to failure to thrive secondary to advanced dementia.    Hospitalization prolonged by mild volume overload nearly resolved.   Main issue now is advanced dementia, failure to thrive and poor oral intake.    07/01 atrial fibrillation with RVR.  I spoke with her son about advance directives.   07/03 patient with progressive dementia, likely at her new baseline. I  spoke with her son this am, and family have decided to change code status to DNR. Patient no longer qualifies to SNF because not participating in physical therapy, but she does qualify for residential hospice.   Her family has not decided yet about residential hospice or look for SNF with custodial care.  Palliative care has been consulted.    Assessment and Plan: * Acute encephalopathy Acute metabolic encephalopathy with delirium, likely multifactorial, dehydration, hypoxemia, hypernatremia and poor nutrition.  Progressive dementia, worsening over last 6 months, poor prognosis. Likely patient has reached a new baseline. Continue with very poor oral intake and not much interactive.   Considering her poor prognosis and rapidly progressive dementia, patient will qualify for residential hospice.   Pending on family decision about final disposition.   Acute renal failure superimposed on stage 3b chronic kidney disease (HCC) Hypernatremia. Hypokalemia. Hyperkalemia.  Very poor oral intake, her renal function toady has worsening cr at 1,2 with K at 5,0 and serum bicarbonate at 29.   Will add gentle IV fluids for today and follow up renal function in am.  Patient with progressive and advance dementia, poor oral intake, high risk for renal failure, dehydration and electrolyte abnormalities.   Considering her prognosis, she will not benefit from tube feedings that likely in her case carries a high risk for complications, including aspiration pneumonia, and has the potential of prolonging patient's suffering.    Sepsis due to gram-negative UTI (HCC) Severe sepsis, present on admission, end organ .  Positive Klebsiella ESBL 06/01 Enterococcus faecalis  on this admission 10,000 CFU.   Urine infection has been ruled out and antibiotic therapy has been discontinued.   Acute on chronic diastolic CHF (congestive heart failure) (HCC) Echocardiogram from 12/2021 with preserved LV systolic function  but mild reduction in RV systolic function.   Admission elevation of liver enzymes due to volume overload.   Poor oral intake.  Today with signs of hypovolemia, will add IV fluids for today.      Acute respiratory failure with hypoxia (HCC) Right pleural effusion.   Stable oxygenation, today she is on 3 L/min per [place] with 02 saturation 100%  Alzheimer's disease ([place]) Patient very deconditioned Advanced dementia it has been progressive. Likely now at a new baseline.   Continue with donepezil and memantine.  Swallow dysfunction, continue with dysphagia 1 diet.   Patient will qualify for hospice.   Hypertension Changed atenolol to metoprolol for rate control atrial fibrillation.   Dyslipidemia, continue statin therapy.   Permanent atrial fibrillation (HCC) Positive RVR.   Required IV diltiazem drip for rate control.   Plan to continue metoprolol 50 mg po bid Add oral diltiazem 30 mg qid and wean diltiazem infusion to heart rate less than 100 bpm.   Anticoagulation with rivaroxaban.   Acute urinary retention Patient has a foley cathter in place due to urinary retention, not present on admission.   Dysphagia Continue with aspiration precautions.   Orthostatic hypotension Fall precautions.   Pressure injury of skin Unable to stage coccyx pressure ulcer.  Present on admission,   Thrombocytopenia (HCC) Chronic anemia with Hgb at 10.3 Thrombocytopenia has improved to 150, Reactive leukocytosis at 12,2         Subjective: Patient continue to have poor responsiveness, today her eyes are open and has mumbling words, not following commands, her daughter in law is at the bedside   Physical Exam: Vitals:   07/01/22 0500 07/01/22 0600 07/01/22 1127 07/01/22 1252  BP: 127/70 (!) 110/57 112/61 113/73  Pulse: 75 99 91   Resp: (!) 25 (!) 23 18   Temp: 97.9 F (36.6 C)  97.8 F (36.6 C)   TempSrc: Oral  Oral   SpO2: 96% 96% 97%   Weight: 66 kg     Height:        Neurology eyes open, very weak and deconditioned, mumbling words, no following commands  ENT with pallor Cardiovascular with S1 and S2 present irregularly irregular with no gallops Respiratory with no rales or wheezing Abdomen not distended No lower extremity edema  Data Reviewed:    Family Communication: I spoke with patient's daughter in law at the bedside, we talked in detail about patient's condition, plan of care and prognosis and all questions were addressed.   Disposition: Status is: Inpatient Remains inpatient appropriate because: progressive dementia, encephalopathy, needs placement   Planned Discharge Destination:  to be determined      Author: Tawni Millers, MD 07/01/2022 1:00 PM  For on call review [email or web address].

## 2022-07-02 DIAGNOSIS — R627 Adult failure to thrive: Secondary | ICD-10-CM | POA: Diagnosis not present

## 2022-07-02 DIAGNOSIS — G309 Alzheimer's disease, unspecified: Secondary | ICD-10-CM | POA: Diagnosis not present

## 2022-07-02 DIAGNOSIS — G9340 Encephalopathy, unspecified: Secondary | ICD-10-CM | POA: Diagnosis not present

## 2022-07-02 DIAGNOSIS — F0280 Dementia in other diseases classified elsewhere without behavioral disturbance: Secondary | ICD-10-CM | POA: Diagnosis not present

## 2022-07-02 LAB — BASIC METABOLIC PANEL
Anion gap: 9 (ref 5–15)
BUN: 107 mg/dL — ABNORMAL HIGH (ref 8–23)
CO2: 27 mmol/L (ref 22–32)
Calcium: 8.7 mg/dL — ABNORMAL LOW (ref 8.9–10.3)
Chloride: 105 mmol/L (ref 98–111)
Creatinine, Ser: 1.09 mg/dL — ABNORMAL HIGH (ref 0.44–1.00)
GFR, Estimated: 49 mL/min — ABNORMAL LOW (ref >60)
Glucose, Bld: 115 mg/dL — ABNORMAL HIGH (ref 70–99)
Potassium: 5.2 mmol/L — ABNORMAL HIGH (ref 3.5–5.1)
Sodium: 141 mmol/L (ref 135–145)

## 2022-07-02 LAB — CBC
HCT: 32.6 % — ABNORMAL LOW (ref 36.0–46.0)
Hemoglobin: 9.9 g/dL — ABNORMAL LOW (ref 12.0–15.0)
MCH: 28.4 pg (ref 26.0–34.0)
MCHC: 30.4 g/dL (ref 30.0–36.0)
MCV: 93.7 fL (ref 80.0–100.0)
Platelets: 173 10*3/uL (ref 150–400)
RBC: 3.48 MIL/uL — ABNORMAL LOW (ref 3.87–5.11)
RDW: 20.9 % — ABNORMAL HIGH (ref 11.5–15.5)
WBC: 12.6 10*3/uL — ABNORMAL HIGH (ref 4.0–10.5)
nRBC: 0 % (ref 0.0–0.2)

## 2022-07-02 MED ORDER — SODIUM ZIRCONIUM CYCLOSILICATE 5 G PO PACK
5.0000 g | PACK | Freq: Once | ORAL | Status: AC
  Administered 2022-07-02: 5 g via ORAL
  Filled 2022-07-02 (×2): qty 1

## 2022-07-02 NOTE — Progress Notes (Signed)
PROGRESS NOTE  [name] Dyer  WOE:[id number] DOB: 02/06/1932 DOA: 06/09/2022 PCP: [name], NP   Brief Narrative:  Patient is a 88 year old female with history of permanent A-fib, dementia, chronic congestive heart failure, CKD stage IIIb, recent hospitalization for ESBL UTI and was discharged to skilled nursing facility for severe deconditioning presented back with very poor oral intake, increased oxygen requirement.  She was found to be volume overloaded on presentation, tachycardic.  Chest x-ray showed cardiomegaly, moderate right-sided pleural effusion.  Hospital course remarkable for prolonged stay with failure to thrive, poor oral intake.  Goals of care discussed and patient made DNR.  PT/OT recommended SNF for discharge but her insurance declined due to her possibility of custodial status, she has not participated with therapy that months..  Family appealing, waiting for long-term care.  We have recommended hospice approach, palliative care consulted.    Assessment & Plan:  Principal Problem:   Acute encephalopathy Active Problems:   Acute renal failure superimposed on stage 3b chronic kidney disease (HCC)   Sepsis due to gram-negative UTI (HCC)   Acute on chronic diastolic CHF (congestive heart failure) (HCC)   Acute respiratory failure with hypoxia (HCC)   Alzheimer's disease ([place])   Hypertension   Permanent atrial fibrillation (HCC)   Acute urinary retention   Dysphagia   Orthostatic hypotension   Pressure injury of skin   Thrombocytopenia (HCC)   Acute metabolic encephalopathy/goals of care/history of dementia: Multifactorial secondary to delirium, dehydration, hypoxemia, hyponatremia, poor oral intake.  Most likely patient has progressive dementia that has worsened for last 6 months.  Not much interactive, has very poor oral intake.  We have recommended hospice approach.  Palliative care consulted.  Maintain delirium precautions.  Continue dementia  medications.  AKI on CKD stage IIIb/hyponatremia/electrolyte abnormalities: Very poor oral intake.  Started on gentle IV fluids.  Currently appears euvolemic.  Lab work showed potassium of 5.2 today.  Will give a dose of Lokelma.   Suspected sepsis: Blood cultures have shown Staph epidermidis, staph hominis.  Urine culture showed 10,000 colonies of Enterococcus faecalis.  Antibiotics have been discontinued now.  Has persistent leukocytosis but afebrile  Acute on chronic diastolic congestive heart failure: Echo in January 2023 showed preserved ejection fraction with mild reduction in the right ventricular systolic function.  Currently appears almost euvolemic.  Acute respiratory failure with hypoxia: Also has right pleural effusion.  On 3 L/min, we will try to wean the oxygen.  Permanent A-fib: Currently rate is controlled.  Remains in A-fib.On  .Metoprolol,cardizem   She has required IV Cardizem for rate control during this hospitalization.  Monitored for anticoagulation  Urine retention: Foley was placed during this hospitalization  Normocytic anemia: Currently hemoglobin stable in the range of 9-10.  Dysphagia: Continue aspiration precaution  Pressure Injury 06/17/22 Coccyx Mid;Lower Unstageable - Full thickness tissue loss in which the base of the injury is covered by slough (yellow, tan, gray, green or brown) and/or eschar (tan, brown or black) in the wound bed. (Active)  06/17/22 1500  Location: Coccyx  Location Orientation: Mid;Lower  Staging: Unstageable - Full thickness tissue loss in which the base of the injury is covered by slough (yellow, tan, gray, green or brown) and/or eschar (tan, brown or black) in the wound bed.  Wound Description (Comments):   Present on Admission: Yes        Nutrition Problem: Inadequate oral intake Etiology: lethargy/confusion Pressure Injury 06/17/22 Coccyx Mid;Lower Unstageable - Full thickness tissue loss in which the  base of the injury is  covered by slough (yellow, tan, gray, green or brown) and/or eschar (tan, brown or black) in the wound bed. (Active)  06/17/22 1500  Location: Coccyx  Location Orientation: Mid;Lower  Staging: Unstageable - Full thickness tissue loss in which the base of the injury is covered by slough (yellow, tan, gray, green or brown) and/or eschar (tan, brown or black) in the wound bed.  Wound Description (Comments):   Present on Admission: Yes  Dressing Type Foam - Lift dressing to assess site every shift 06/30/22 2000    DVT prophylaxis:Place TED hose Start: 06/21/22 1218 Rivaroxaban (XARELTO) tablet 15 mg     Code Status: DNR  Family Communication:   Patient status:Inpatient  Patient is from SNF  Anticipated discharge to:SNF vs Hospice  Estimated DC date:   Consultants: Palliative care  Procedures: None  Antimicrobials:  Anti-infectives (From admission, onward)    Start     Dose/Rate Route Frequency Ordered Stop   06/09/2022 2030  meropenem (MERREM) 1 g in sodium chloride 0.9 % 100 mL IVPB  Status:  Discontinued        1 g 200 mL/hr over 30 Minutes Intravenous Every 12 hours 06/10/2022 2016 06/18/22 1927   06/12/2022 1815  piperacillin-tazobactam (ZOSYN) IVPB 3.375 g        3.375 g 100 mL/hr over 30 Minutes Intravenous  Once 06/05/2022 1813 06/15/2022 1938   06/15/2022 1600  ceFEPIme (MAXIPIME) 2 g in sodium chloride 0.9 % 100 mL IVPB  Status:  Discontinued        2 g 200 mL/hr over 30 Minutes Intravenous  Once 06/18/2022 1552 06/06/2022 1609   06/23/2022 1600  metroNIDAZOLE (FLAGYL) IVPB 500 mg  Status:  Discontinued        500 mg 100 mL/hr over 60 Minutes Intravenous  Once 06/15/2022 1552 06/10/2022 1609   06/20/2022 1600  vancomycin (VANCOCIN) IVPB 1000 mg/200 mL premix  Status:  Discontinued        1,000 mg 200 mL/hr over 60 Minutes Intravenous  Once 06/10/2022 1552 06/08/2022 1609       Subjective: Patient seen and examined at the bedside this morning.  Hemodynamically stable, very weak,  deconditioned.  Does not speak that much.  Lying on bed ,almost immobile  Objective: Vitals:   07/02/22 0000 07/02/22 0300 07/02/22 0800 07/02/22 1100  BP:  122/65 (!) 122/92 124/68  Pulse: (!) 101 72 87 80  Resp: (!) 22 18 (!) 22 (!) 25  Temp:  98.2 F (36.8 C) 98.2 F (36.8 C) 97.7 F (36.5 C)  TempSrc:  Axillary Axillary Oral  SpO2:  96% 95% 96%  Weight:  66.4 kg    Height:        Intake/Output Summary (Last 24 hours) at 07/02/2022 1154 Last data filed at 07/02/2022 1057 Gross per 24 hour  Intake 860.26 ml  Output 625 ml  Net 235.26 ml   Filed Weights   06/30/22 0504 07/01/22 0500 07/02/22 0300  Weight: 66.2 kg 66 kg 66.4 kg    Examination:  General exam: Very deconditioned, chronically ill looking, extremely weak HEENT: PERRL Respiratory system:  no wheezes or crackles, diminished air sounds on bases Cardiovascular system: Irregularly irregular rhythm Gastrointestinal system: Abdomen is nondistended, soft and nontender. Central nervous system: Alert and awake but not oriented Extremities: No edema, no clubbing ,no cyanosis Skin: No rashes, no ulcers,no icterus     Data Reviewed: I have personally reviewed following labs and imaging studies  CBC: Recent  Labs  Lab 06/26/22 0628 06/26/22 2209 06/30/22 0720 07/02/22 0253  WBC 8.4 10.8* 12.2* 12.6*  HGB 10.5* 10.3* 10.3* 9.9*  HCT 32.3* 32.3* 33.3* 32.6*  MCV 90.5 91.0 92.5 93.7  PLT PLATELET CLUMPS NOTED ON SMEAR, UNABLE TO ESTIMATE 150 PLATELET CLUMPS NOTED ON SMEAR, UNABLE TO ESTIMATE 277   Basic Metabolic Panel: Recent Labs  Lab 06/26/22 2209 06/28/22 0359 06/30/22 0720 07/01/22 0209 07/02/22 0253  NA 136 140 142 141 141  K 4.3 4.2 5.2* 5.0 5.2*  CL 98 101 105 101 105  CO2 _0 GLUCOSE 127* 111* 106* 130* 115*  BUN 61* 59* 77* 92* 107*  CREATININE 0.99 0.90 0.98 1.22* 1.09*  CALCIUM 8.3* 8.7* 8.6* 9.0 8.7*     Recent Results (from the past 240 hour(s))  Culture, blood (Routine X  2) w Reflex to ID Panel     Status: None   Collection Time: 06/26/22 10:07 PM   Specimen: BLOOD  Result Value Ref Range Status   Specimen Description BLOOD LEFT ANTECUBITAL  Final   Special Requests   Final    BOTTLES DRAWN AEROBIC AND ANAEROBIC Blood Culture adequate volume   Culture   Final    NO GROWTH 5 DAYS Performed at [facility], [address] [place], [place] [address]    Report Status 07/01/2022 FINAL  Final  Culture, blood (Routine X 2) w Reflex to ID Panel     Status: Abnormal   Collection Time: 06/26/22 10:16 PM   Specimen: BLOOD LEFT HAND  Result Value Ref Range Status   Specimen Description BLOOD LEFT HAND  Final   Special Requests   Final    BOTTLES DRAWN AEROBIC ONLY Blood Culture results may not be optimal due to an inadequate volume of blood received in culture bottles   Culture  Setup Time   Final    GRAM POSITIVE COCCI IN CLUSTERS AEROBIC BOTTLE ONLY CRITICAL RESULT CALLED TO, READ BACK BY AND VERIFIED WITH: PHARMD GREG ABBOTT 06/28/22_1 :105 BY TW    Culture (A)  Final    STAPHYLOCOCCUS EPIDERMIDIS THE SIGNIFICANCE OF ISOLATING THIS ORGANISM FROM A SINGLE SET OF BLOOD CULTURES WHEN MULTIPLE SETS ARE DRAWN IS UNCERTAIN. PLEASE NOTIFY THE MICROBIOLOGY DEPARTMENT WITHIN ONE WEEK IF SPECIATION AND SENSITIVITIES ARE REQUIRED. Performed at [facility], [place] [address] [place], [place] [address]    Report Status 06/29/2022 FINAL  Final  Blood Culture ID Panel (Reflexed)     Status: Abnormal   Collection Time: 06/26/22 10:16 PM  Result Value Ref Range Status   Enterococcus faecalis NOT DETECTED NOT DETECTED Final   Enterococcus Faecium NOT DETECTED NOT DETECTED Final   Listeria monocytogenes NOT DETECTED NOT DETECTED Final   Staphylococcus species DETECTED (A) NOT DETECTED Final    Comment: CRITICAL RESULT CALLED TO, READ BACK BY AND VERIFIED WITH: PHARMD GREG ABBOTT 06/28/22_2 :53 BY TW    Staphylococcus aureus (BCID) NOT DETECTED NOT DETECTED Final    Staphylococcus epidermidis DETECTED (A) NOT DETECTED Final    Comment: Methicillin (oxacillin) resistant coagulase negative staphylococcus. Possible blood culture contaminant (unless isolated from more than one blood culture draw or clinical case suggests pathogenicity). No antibiotic treatment is indicated for blood  culture contaminants. CRITICAL RESULT CALLED TO, READ BACK BY AND VERIFIED WITH: PHARMD GREG ABBOTT 06/28/22_3 :53 BY TW    Staphylococcus lugdunensis NOT DETECTED NOT DETECTED Final   Streptococcus species NOT DETECTED NOT DETECTED Final   Streptococcus agalactiae NOT  DETECTED NOT DETECTED Final   Streptococcus pneumoniae NOT DETECTED NOT DETECTED Final   Streptococcus pyogenes NOT DETECTED NOT DETECTED Final   A.calcoaceticus-baumannii NOT DETECTED NOT DETECTED Final   Bacteroides fragilis NOT DETECTED NOT DETECTED Final   Enterobacterales NOT DETECTED NOT DETECTED Final   Enterobacter cloacae complex NOT DETECTED NOT DETECTED Final   Escherichia coli NOT DETECTED NOT DETECTED Final   Klebsiella aerogenes NOT DETECTED NOT DETECTED Final   Klebsiella oxytoca NOT DETECTED NOT DETECTED Final   Klebsiella pneumoniae NOT DETECTED NOT DETECTED Final   Proteus species NOT DETECTED NOT DETECTED Final   Salmonella species NOT DETECTED NOT DETECTED Final   Serratia marcescens NOT DETECTED NOT DETECTED Final   Haemophilus influenzae NOT DETECTED NOT DETECTED Final   Neisseria meningitidis NOT DETECTED NOT DETECTED Final   Pseudomonas aeruginosa NOT DETECTED NOT DETECTED Final   Stenotrophomonas maltophilia NOT DETECTED NOT DETECTED Final   Candida albicans NOT DETECTED NOT DETECTED Final   Candida auris NOT DETECTED NOT DETECTED Final   Candida glabrata NOT DETECTED NOT DETECTED Final   Candida krusei NOT DETECTED NOT DETECTED Final   Candida parapsilosis NOT DETECTED NOT DETECTED Final   Candida tropicalis NOT DETECTED NOT DETECTED Final   Cryptococcus neoformans/gattii NOT  DETECTED NOT DETECTED Final   Methicillin resistance mecA/C DETECTED (A) NOT DETECTED Final    Comment: CRITICAL RESULT CALLED TO, READ BACK BY AND VERIFIED WITH: PHARMD GREG ABBOTT 06/28/22_0 :53 BY TW Performed at [facility], [address], [place], [place] [address]      Radiology Studies: No results found.  Scheduled Meds:  allopurinol  100 mg Oral Daily   Chlorhexidine Gluconate Cloth  6 each Topical Daily   diltiazem  30 mg Oral Q6H   donepezil  10 mg Oral QHS   feeding supplement  237 mL Oral TID BM   hydrocerin   Topical BID   leptospermum manuka honey  1 Application Topical Daily   memantine  5 mg Oral BID   metoprolol tartrate  50 mg Oral BID   multivitamin with minerals  1 tablet Oral Daily   mouth rinse  15 mL Mouth Rinse 4 times per day   pantoprazole  40 mg Oral Daily   Rivaroxaban  15 mg Oral Q supper   sodium chloride flush  3 mL Intravenous Q12H   Continuous Infusions:  sodium chloride 50 mL/hr at 07/01/22 1441   diltiazem (CARDIZEM) infusion Stopped (07/01/22 1254)     LOS: 16 days   Shelly Coss, MD [facility] P7/04/2022, 11:54 AM

## 2022-07-02 NOTE — TOC Progression Note (Signed)
Transition of Care [facility]) - Progression Note    Patient Details  Name: [name] Dyer MRN: [id number] Date of Birth: 1932-01-09  Transition of Care [facility]) CM/SW [place], [place] Phone Number: 07/02/2022, 3:54 PM  Clinical Narrative:     CSW contacted pt son [name] Dyer who confirmed he initiated fast track appeal for SNF denial. He stated that he was informed that it would take 1.5-2 days and he consented for insurance company to request records from [facility] He was provided with a contact# [phone] for [facility]. CSW called Harriett Sine; left message requesting return call.   Expected Discharge Plan: Skilled Nursing Facility Barriers to Discharge: Family Issues, SNF Authorization Denied  Expected Discharge Plan and Services Expected Discharge Plan: [place] arrangements for the past 2 months: [place], Single Family Home                                       Social Determinants of Health (SDOH) Interventions    Readmission Risk Interventions    05/31/2022    5:13 PM  Readmission Risk Prevention Plan  Transportation Screening Complete  Medication Review (RN Care Manager) Referral to Pharmacy  PCP or Specialist appointment within 3-5 days of discharge Complete  HRI or Home Care Consult Complete  SW Recovery Care/Counseling Consult Complete  [place] Complete

## 2022-07-02 NOTE — Progress Notes (Signed)
Occupational Therapy Treatment Patient Details Name: [name] Dyer MRN: [id number] DOB: 11/15/1932 Today's Date: 07/02/2022   History of present illness Pt is an 88 y/o female admitted from SNF secondary to sepsis from UTI and acute metabolic encephalopathy. PMH includes dementia, CHF, a fib, HTN, CKD, and CHF.   OT comments  Pt session limited due to pain and inability to follow commands. OT assisted with PROM of BUE and provided pt's son with education on PROM to promote relaxation and extension through both hands. Continuing to recommend SNF and OT will continue to follow acutely.    Recommendations for follow up therapy are one component of a multi-disciplinary discharge planning process, led by the attending physician.  Recommendations may be updated based on patient status, additional functional criteria and insurance authorization.    Follow Up Recommendations  Skilled nursing-short term rehab (<3 hours/day)    Assistance Recommended at Discharge Frequent or constant Supervision/Assistance  Patient can return home with the following  Two people to help with walking and/or transfers;Two people to help with bathing/dressing/bathroom;Assistance with feeding;Assist for transportation;Direct supervision/assist for medications management   Equipment Recommendations  None recommended by OT    Recommendations for Other Services      Precautions / Restrictions Precautions Precautions: Fall Precaution Comments: advanced dementia Restrictions Weight Bearing Restrictions: No       Mobility Bed Mobility               General bed mobility comments: Pt remained bedlevel this session, total A +1-2 for repositioning in bed.    Transfers                         Balance                                           ADL either performed or assessed with clinical judgement   ADL Overall ADL's : Needs assistance/impaired                                        General ADL Comments: Pt seen for BUE ROM and family education    Extremity/Trunk Assessment Upper Extremity Assessment Upper Extremity Assessment: RUE deficits/detail;LUE deficits/detail RUE Deficits / Details: Limited tolerance with ROM, stiff joints, hands relaxed in flexion RUE: Unable to fully assess due to pain RUE Sensation: decreased light touch RUE Coordination: decreased fine motor;decreased gross motor LUE Deficits / Details: Limited tolerance with ROM, stiff joints, hands relaxed in flexion LUE: Unable to fully assess due to pain LUE Sensation: decreased light touch LUE Coordination: decreased gross motor;decreased fine motor   Lower Extremity Assessment Lower Extremity Assessment: Defer to PT evaluation        Vision   Vision Assessment?: Vision impaired- to be further tested in functional context   Perception     Praxis      Cognition Arousal/Alertness: Awake/alert Behavior During Therapy: Flat affect Overall Cognitive Status: History of cognitive impairments - at baseline                                 General Comments: pt opens eyes but with minimal communication verbally, does not initiate any AROM during session and does not  follow commands        Exercises Exercises: Other exercises Other Exercises Other Exercises: PROM of BUE - Worked on stretching into extension at the elbows and digits Other Exercises: PROM of BUE - Shoulder into flexion, wrists into ext/flex x10 Other Exercises: Pt yelling out in pain with BUE in pronation, no difficulties into supination    Shoulder Instructions       General Comments VSS    Pertinent Vitals/ Pain       Pain Assessment Pain Assessment: Faces Faces Pain Scale: Hurts even more Pain Location: BUE when pronating and flexing/extending each digit Pain Descriptors / Indicators: Moaning Pain Intervention(s): Limited activity within patient's tolerance, Monitored during  session, Repositioned  Home Living                                          Prior Functioning/Environment              Frequency  Min 2X/week        Progress Toward Goals  OT Goals(current goals can now be found in the care plan section)  Progress towards OT goals: OT to reassess next treatment  Acute Rehab OT Goals Patient Stated Goal: none stated OT Goal Formulation: Patient unable to participate in goal setting Time For Goal Achievement: 07/16/22 Potential to Achieve Goals: Poor ADL Goals Pt Will Perform Eating: with min assist;with adaptive utensils Pt Will Perform Grooming: with min assist;bed level  Plan Discharge plan remains appropriate;Frequency remains appropriate    Co-evaluation                 AM-PAC OT "6 Clicks" Daily Activity     Outcome Measure   Help from another person eating meals?: Total Help from another person taking care of personal grooming?: Total Help from another person toileting, which includes using toliet, bedpan, or urinal?: Total Help from another person bathing (including washing, rinsing, drying)?: Total Help from another person to put on and taking off regular upper body clothing?: Total Help from another person to put on and taking off regular lower body clothing?: Total 6 Click Score: 6    End of Session    OT Visit Diagnosis: Other abnormalities of gait and mobility (R26.89);Muscle weakness (generalized) (M62.81);Other symptoms and signs involving cognitive function;Pain Pain - part of body: Arm;Leg   Activity Tolerance Patient limited by pain   Patient Left in bed;with call bell/phone within reach;with bed alarm set   Nurse Communication Mobility status        Time: 1528-1600 OT Time Calculation (min): 32 min  Charges: OT General Charges $OT Visit: 1 Visit OT Treatments $Therapeutic Exercise: 23-37 mins  Bich Mchaney H., OTR/L Acute Rehabilitation  Chenita Ruda Elane Normalee Sistare 07/02/2022, 7:14 PM

## 2022-07-02 NOTE — Care Management Important Message (Signed)
Important Message  Patient Details  Name: [name] Dyer MRN: [id number] Date of Birth: [birth year]   Medicare Important Message Given:  Yes     Shelda Altes 07/02/2022, 7:56 AM

## 2022-07-02 NOTE — Consult Note (Signed)
Consultation Note Date: 07/02/2022   Patient Name: [name] Dyer  DOB: May 23, 1932  MRN: [id number]  Age / Sex: 88 y.o., female  PCP: Ngetich, [name], NP Referring Physician: Shelly Coss, MD  Reason for Consultation: Establishing goals of care  HPI/Patient Profile: 88 y.o. female  with past medical history of permanent A-fib, dementia, chronic congestive heart failure, CKD stage IIIb, and recent hospitalization for ESBL UTI  admitted on 06/09/2022 with poor PO intake. Chest x-ray showed cardiomegaly, moderate right-sided pleural effusion.Patient continues with FTT and poor PO intake. Patient has been declined for rehab as she is unable to participate. PMT consulted to [facility].   Clinical Assessment and Goals of Care: I have reviewed medical records including EPIC notes, labs and imaging, received report from RN, assessed the patient and then met with sons  to discuss diagnosis prognosis, GOC, EOL wishes, disposition and options.  I introduced Palliative Medicine as specialized medical care for people living with serious illness. It focuses on providing relief from the symptoms and stress of a serious illness. The goal is to improve quality of life for both the patient and the family.  Initially met with patient's son [name] Dyer at bedside. We reviewed patient's clinical condition. [name] Dyer shares that prior to hospitalization in June patient was ambulatory and verbal but has not been as much since that time. He shares about complications since then. He shares about dc to rehab however patient unable to participate in rehab. We discuss options moving forward: review that rehab has been declined however his brother is appealing, LTC, vs hospice facility. Discussed care at [facility] facility vs hospice facility. Spoke at length about philosophy of hospice face and type of support provided. [name] Dyer seems to indicate hospice approach seems appropriate. He asks  that I speak to his brother about it.   Spoke with [name] Dyer via telephone at length. We reviewed the above. [name]'s goals of care for patient are not in line with hospice philosophy. He would like continued aggressive medical care. He would want rehospitalization following discharge if indicated. He wants all medications continued. We review concern that patient is nearing end of life. He feels that patient has not been given a chance since her previous hospitalization and delirium. We discuss that it is a poor prognostic sign when patien's in the hospital with dementia develop delirium. We discuss that she has declined functionally, nutritionally, and cognitively. We discuss trying to look at "big picture". We review her previous rehab stay and how she was unable to participate. I review notes with him from physical therapy this admission and how she has been unable to participate. We discuss that hospice does not expedite death; hospice focuses on comfort and quality of life while supporting the patient through the natural disease process. He shares that allowing the natural disease process to occur is not in line with his opinion on how to best care for patient. I share my concern with him that patient may be readmitted rather quickly with a dc to a facility without the support of hospice. He understands. [name] Dyer does share he is open to ongoing follow up from palliative team.   Primary Decision Maker HCPOA - sons both named as joint HCPOA    SUMMARY [facility] would be agreeable to hospice care however son [name] Dyer is not - he is appealing rehab and hopeful for rehab. His goals not in line with hospice care.  - PMT will follow  Code Status/Advance Care Planning:  DNR      Primary Diagnoses: Present on Admission:  Acute encephalopathy  Permanent atrial fibrillation (HCC)  Hypertension  Acute on chronic diastolic CHF (congestive heart failure) (HCC)  Alzheimer's disease  ([place])  Acute renal failure superimposed on stage 3b chronic kidney disease ([place])  Acute respiratory failure with hypoxia (HCC)  Sepsis due to gram-negative UTI (HCC)  Pressure injury of skin  Orthostatic hypotension  Acute urinary retention  Thrombocytopenia ([place])   I have reviewed the medical record, interviewed the patient and family, and examined the patient. The following aspects are pertinent.  Past Medical History:  Diagnosis Date   Alzheimer disease ([place])    Anemia    Arrhythmia    Cataract    Colon polyps    Community acquired pneumonia 01/2012   LLL   Congestive heart failure ([place])    Hyperlipidemia    Hypertension    Memory difficulty 10/01/2016   Osteoporosis    Permanent atrial fibrillation (HCC)    Pleural effusion, right 01/2012   SVT (supraventricular tachycardia) ([place]) 01/2012   UTI (lower urinary tract infection)    Social History   Socioeconomic History   Marital status: Widowed    Spouse name: Not on file   Number of children: 2   Years of education: 71   Highest education level: Not on file  Occupational History   Occupation: Retired  Tobacco Use   Smoking status: Never   Smokeless tobacco: Never  Vaping Use   Vaping Use: Never used  Substance and Sexual Activity   Alcohol use: Never   Drug use: Never   Sexual activity: Not on file  Other Topics Concern   Not on file  Social History Narrative   Diet: low sodium/ fluid      Caffeine:Green tea, cheerwine,chocolate      Married, if yes what year: [phone]      Do you live in a house, apartment, assisted living, condo, trailer, ect: House      Is it one or more stories: One      How many persons live in your home? 1      Pets: No      Highest level or education completed: High School      Current/Past profession: [employer]      Exercise: Yes                 Type and how often: Walking, strength training          Living Will: Yes    DNR: No   POA/HPOA: Yes       Functional Status:   Do you have difficulty bathing or dressing yourself? No   Do you have difficulty preparing food or eating? No   Do you have difficulty managing your medications? Yes   Do you have difficulty managing your finances? Yes   Do you have difficulty affording your medications? Yes   Social Determinants of Health   Financial Resource Strain: Not on file  Food Insecurity: Not on file  Transportation Needs: Not on file  Physical Activity: Not on file  Stress: Not on file  Social Connections: Not on file   Family History  Problem Relation Age of Onset   Heart Problems Mother    Osteoporosis Mother    Stroke Father    Heart Problems Father    Other Brother 11       COVID   Colon polyps Son  Parkinson's disease Son    Neuropathy Son    Heart disease Son    Cancer Grandchild    Scheduled Meds:  allopurinol  100 mg Oral Daily   Chlorhexidine Gluconate Cloth  6 each Topical Daily   diltiazem  30 mg Oral Q6H   donepezil  10 mg Oral QHS   feeding supplement  237 mL Oral TID BM   hydrocerin   Topical BID   leptospermum manuka honey  1 Application Topical Daily   memantine  5 mg Oral BID   metoprolol tartrate  50 mg Oral BID   multivitamin with minerals  1 tablet Oral Daily   mouth rinse  15 mL Mouth Rinse 4 times per day   pantoprazole  40 mg Oral Daily   Rivaroxaban  15 mg Oral Q supper   sodium chloride flush  3 mL Intravenous Q12H   sodium zirconium cyclosilicate  5 g Oral Once   Continuous Infusions:  diltiazem (CARDIZEM) infusion Stopped (07/01/22 1254)   PRN Meds:.acetaminophen **OR** acetaminophen, Muscle Rub, ondansetron **OR** ondansetron (ZOFRAN) IV, mouth rinse Allergies  Allergen Reactions   Ciprofloxacin Diarrhea   Sulfa Antibiotics Hives, Rash and Other (See Comments)    Severe Rash   Nitrofurantoin Rash and Other (See Comments)    Developed severe rash on both legs   Memantine Other (See Comments)    Dizziness. Pt takes this  medication at home   Succinylsulphathiazole Rash   Sulfamethoxazole Rash   Review of Systems  Unable to perform ROS: Dementia    Physical Exam Constitutional:      General: She is not in acute distress.    Appearance: She is ill-appearing.  Pulmonary:     Comments: Mild tachypnea noted Skin:    General: Skin is warm and dry.  Neurological:     Mental Status: She is alert. She is disoriented.     Vital Signs: BP 117/85   Pulse 80   Temp 97.7 F (36.5 C) (Oral)   Resp (!) 25   Ht _0  (1.549 m)   Wt 66.4 kg   SpO2 96%   BMI 27.66 kg/m  Pain Scale: 0-10   Pain Score: Asleep   SpO2: SpO2: 96 % O2 Device:SpO2: 96 % O2 Flow Rate: .O2 Flow Rate (L/min): 4 L/min  IO: Intake/output summary:  Intake/Output Summary (Last 24 hours) at 07/02/2022 1639 Last data filed at 07/02/2022 1057 Gross per 24 hour  Intake 860.26 ml  Output 625 ml  Net 235.26 ml    LBM: Last BM Date : 07/01/22 Baseline Weight: Weight: 58 kg Most recent weight: Weight: 66.4 kg     Palliative Assessment/Data: PPS 20%     *Please note that this is a verbal dictation therefore any spelling or grammatical errors are due to the "[employer]" system interpretation.   Juel Burrow, DNP, AGNP-C Palliative Medicine Team [phone] Pager: [phone]

## 2022-07-02 NOTE — Progress Notes (Signed)
Speech Language Pathology Treatment: Dysphagia  Patient Details Name: [name] Dyer MRN: [id number] DOB: June 30, 1932 Today's Date: 07/02/2022 Time: 2334-3568 SLP Time Calculation (min) (ACUTE ONLY): 15 min  Assessment / Plan / Recommendation Clinical Impression  Pt seen for dysphagia without family present this session and cried out x 1 when repositioned. Received pudding with labial rounding versus oral opening and although her propulsion/swallow initiated was delayed it was mild today compared to prior sessions. Additional time given for initial swallow and produced a spontaneous second swallow after trials. There was one delayed cough and suspect may have been from pharyngeal spill while swallow triggered. Education is ongoing with pt son (prior session) re: swallow/nutrition status in light of her progressing dementia. Recommend continue Dys 1, thin with swallow precautions giving her additional time to swallow, using suction if needed after reasonable amount of time.    HPI HPI: [name] Dyer is an 88 yo female who was admitted to the hospital with the working diagnosis of acute metabolic encephalopathy. Pt with the past medical history of atrial fibrillation, dementia, chronic heart failure, CKD stage 3b, recent hospitalization for ESBL Klebsiella urine infection (05/29/22 to 06/09/22). Patient was discharged to SNF with severe deconditioning. Post discharge patient had very poor oral intake, increase 02 requirements for 2 to 3 days and fever the day prior to rehospitalization.  CXR 6/19: "1. Cardiomegaly with vascular congestion bilateral pleural  effusions. Airspace disease at the bases could be due to atelectasis  or pneumonia  2. Hazy asymmetric opacity right thorax could be due to pleural  effusion versus asymmetric edema". Pt was discharged from speech on a Dys 3 diet. Re-ordered today due to significant pocketing.      SLP Plan  Continue with current plan of care       Recommendations for follow up therapy are one component of a multi-disciplinary discharge planning process, led by the attending physician.  Recommendations may be updated based on patient status, additional functional criteria and insurance authorization.    Recommendations  Diet recommendations: Dysphagia 1 (puree);Thin liquid Liquids provided via: Straw Medication Administration: Crushed with puree Supervision: Trained caregiver to feed patient Compensations: Minimize environmental distractions;Slow rate;Small sips/bites;Lingual sweep for clearance of pocketing;Monitor for anterior loss;Other (Comment);Follow solids with liquid (additional time to swallow) Postural Changes and/or Swallow Maneuvers: Seated upright 90 degrees                Oral Care Recommendations: Oral care BID Follow Up Recommendations: Skilled nursing-short term rehab (<3 hours/day) Assistance recommended at discharge: Frequent or constant Supervision/Assistance SLP Visit Diagnosis: Dysphagia, oral phase (R13.11) Plan: Continue with current plan of care           Houston Siren  07/02/2022, 12:21 PM

## 2022-07-03 DIAGNOSIS — G9340 Encephalopathy, unspecified: Secondary | ICD-10-CM | POA: Diagnosis not present

## 2022-07-03 DIAGNOSIS — G309 Alzheimer's disease, unspecified: Secondary | ICD-10-CM | POA: Diagnosis not present

## 2022-07-03 DIAGNOSIS — Z515 Encounter for palliative care: Secondary | ICD-10-CM

## 2022-07-03 DIAGNOSIS — R627 Adult failure to thrive: Secondary | ICD-10-CM | POA: Diagnosis not present

## 2022-07-03 DIAGNOSIS — Z66 Do not resuscitate: Secondary | ICD-10-CM

## 2022-07-03 LAB — CBC
HCT: 34.9 % — ABNORMAL LOW (ref 36.0–46.0)
Hemoglobin: 10.4 g/dL — ABNORMAL LOW (ref 12.0–15.0)
MCH: 27.9 pg (ref 26.0–34.0)
MCHC: 29.8 g/dL — ABNORMAL LOW (ref 30.0–36.0)
MCV: 93.6 fL (ref 80.0–100.0)
Platelets: 140 10*3/uL — ABNORMAL LOW (ref 150–400)
RBC: 3.73 MIL/uL — ABNORMAL LOW (ref 3.87–5.11)
RDW: 20.9 % — ABNORMAL HIGH (ref 11.5–15.5)
WBC: 10.9 10*3/uL — ABNORMAL HIGH (ref 4.0–10.5)
nRBC: 0 % (ref 0.0–0.2)

## 2022-07-03 LAB — BASIC METABOLIC PANEL
Anion gap: 9 (ref 5–15)
BUN: 94 mg/dL — ABNORMAL HIGH (ref 8–23)
CO2: 27 mmol/L (ref 22–32)
Calcium: 8.9 mg/dL (ref 8.9–10.3)
Chloride: 108 mmol/L (ref 98–111)
Creatinine, Ser: 0.93 mg/dL (ref 0.44–1.00)
GFR, Estimated: 59 mL/min — ABNORMAL LOW (ref >60)
Glucose, Bld: 114 mg/dL — ABNORMAL HIGH (ref 70–99)
Potassium: 4.8 mmol/L (ref 3.5–5.1)
Sodium: 144 mmol/L (ref 135–145)

## 2022-07-03 MED ORDER — OXYCODONE HCL 5 MG PO TABS
5.0000 mg | ORAL_TABLET | Freq: Four times a day (QID) | ORAL | Status: DC | PRN
  Administered 2022-07-04: 5 mg via ORAL
  Filled 2022-07-03: qty 1

## 2022-07-03 MED ORDER — MORPHINE SULFATE (PF) 2 MG/ML IV SOLN
0.5000 mg | Freq: Once | INTRAVENOUS | Status: AC | PRN
  Administered 2022-07-03: 0.5 mg via INTRAVENOUS
  Filled 2022-07-03: qty 1

## 2022-07-03 MED ORDER — APIXABAN 5 MG PO TABS
5.0000 mg | ORAL_TABLET | Freq: Two times a day (BID) | ORAL | Status: DC
Start: 2022-07-03 — End: 2022-07-06
  Administered 2022-07-03 – 2022-07-06 (×5): 5 mg via ORAL
  Filled 2022-07-03 (×6): qty 1

## 2022-07-03 MED ORDER — MORPHINE SULFATE (PF) 2 MG/ML IV SOLN
1.0000 mg | INTRAVENOUS | Status: DC | PRN
  Administered 2022-07-03 – 2022-07-06 (×5): 1 mg via INTRAVENOUS
  Filled 2022-07-03 (×5): qty 1

## 2022-07-03 NOTE — Progress Notes (Signed)
[facility] for switching Xarelto to Eliquis Indication: atrial fibrillation  Allergies  Allergen Reactions   Ciprofloxacin Diarrhea   Sulfa Antibiotics Hives, Rash and Other (See Comments)    Severe Rash   Nitrofurantoin Rash and Other (See Comments)    Developed severe rash on both legs   Memantine Other (See Comments)    Dizziness. Pt takes this medication at home   Succinylsulphathiazole Rash   Sulfamethoxazole Rash    Patient Measurements: Height: _0  (154.9 cm) Weight: 66.4 kg (146 lb 6.2 oz) IBW/kg (Calculated) : 47.8  Vital Signs: Temp: 97.8 F (36.6 C) (07/06 0800) Temp Source: Axillary (07/06 0800) BP: 124/77 (07/06 0800) Pulse Rate: 93 (07/06 0800)  Labs: Recent Labs    07/01/22 0209 07/02/22 0253 07/03/22 0333  HGB  --  9.9* 10.4*  HCT  --  32.6* 34.9*  PLT  --  173 140*  CREATININE 1.22* 1.09* 0.93    Estimated Creatinine Clearance: 36.4 mL/min (by C-G formula based on SCr of 0.93 mg/dL).   Medical History: Past Medical History:  Diagnosis Date   Alzheimer disease ([place])    Anemia    Arrhythmia    Cataract    Colon polyps    Community acquired pneumonia 01/2012   LLL   Congestive heart failure ([place])    Hyperlipidemia    Hypertension    Memory difficulty 10/01/2016   Osteoporosis    Permanent atrial fibrillation (HCC)    Pleural effusion, right 01/2012   SVT (supraventricular tachycardia) ([place]) 01/2012   UTI (lower urinary tract infection)     Assessment: 88 yo F on Xarelto PTA for afib.  Pt with dementia and FTT from ongoing poor oral intake.  Xarelto requires food for adequate absorption and I'm concerned she is not meeting the required intake for its efficacy.  Discussed with Dr. Tawanna Solo who agreed with switching to Eliquis which can be taken regardless of nutritional status.  Dose reduction criteria are age >65, wt <60kg, or SCr > 1.5.  Pt currently only meets one of the two required criteria.   If her weight decreases or renal function worsens a dose reduction in Eliquis would be warranted.    Goal of Therapy:  Therapeutic anticoagulation Monitor platelets by anticoagulation protocol: Yes   Plan:  Stop Xarelto (last dose 7/5 PM) Start Eliquis 102m PO BID with first dose 7/6 PM.   KHorton Chin Pharm.D., BCPS Clinical Pharmacist Clinical phone for 07/03/2022 from 7:30-3:00 is [phone].  **Pharmacist phone directory can be found on [facility] listed under [email or web address]  07/03/2022 11:31 AM

## 2022-07-03 NOTE — Progress Notes (Signed)
PROGRESS NOTE  [name] Dyer  LSL:[id number] DOB: January 15, 1932 DOA: 06/25/2022 PCP: [name], NP   Brief Narrative:  Patient is a 88 year old female with history of permanent A-fib, dementia, chronic congestive heart failure, CKD stage IIIb, recent hospitalization for ESBL UTI and was discharged to skilled nursing facility for severe deconditioning presented back with very poor oral intake, increased oxygen requirement.  She was found to be volume overloaded on presentation, tachycardic.  Chest x-ray showed cardiomegaly, moderate right-sided pleural effusion.  Hospital course remarkable for prolonged stay with failure to thrive, poor oral intake.  Goals of care discussed and patient made DNR.  PT/OT recommended SNF for discharge but her insurance declined due to her possibility of custodial status, she has not participated with therapy that much.  Family appealing, waiting for long-term care.  We have recommended hospice approach, palliative care consulted.  Family divided about goals of care.  Palliative care continues to follow.  Awaiting SNF placement    Assessment & Plan:  Principal Problem:   Acute encephalopathy Active Problems:   Acute renal failure superimposed on stage 3b chronic kidney disease (HCC)   Sepsis due to gram-negative UTI (HCC)   Acute on chronic diastolic CHF (congestive heart failure) (HCC)   Acute respiratory failure with hypoxia (HCC)   Alzheimer's disease ([place])   Hypertension   Permanent atrial fibrillation (HCC)   Acute urinary retention   Dysphagia   Orthostatic hypotension   Pressure injury of skin   Thrombocytopenia (HCC)   Acute metabolic encephalopathy/goals of care/history of dementia: Multifactorial secondary to delirium, dehydration, hypoxemia, hyponatremia, poor oral intake.  Most likely patient has progressive dementia that has worsened for last 6 months.  Not much interactive, has very poor oral intake.  We have recommended hospice  placement.  Palliative care consulted.  Palliative care discussed with family, one of the sons not agreeable  hospice approach . maintain delirium precautions.  Continue dementia medications.  AKI on CKD stage IIIb/hyponatremia/electrolyte abnormalities: Very poor oral intake.  Currently appears euvolemic.  Hyperkalemia resolved with Lokelma.  Suspected sepsis: Blood cultures have shown Staph epidermidis, staph hominis.  Urine culture showed 10,000 colonies of Enterococcus faecalis.  Antibiotics have been discontinued now.  Has persistent leukocytosis but afebrile  Acute on chronic diastolic congestive heart failure: Echo in January 2023 showed preserved ejection fraction with mild reduction in the right ventricular systolic function.  Currently appears almost euvolemic.  Lungs are clear on auscultation, no lower extremity edema.  Has some edema on bilateral upper extremities  Acute respiratory failure with hypoxia: Chest x-ray showed patchy bilateral airspace disease, right greater than left,improving on the right since prior study.,small right pleural effusion.  on 3 L/min of oxygen  Permanent A-fib: Currently rate is controlled.  Remains in A-fib.On  .Metoprolol,cardizem   She has required IV Cardizem for rate control during this hospitalization.  Xarelto switched to Eliquis for anticoagulation  Urine retention: Foley was placed during this hospitalization  Normocytic anemia: Currently hemoglobin stable in the range of 9-10.  Dysphagia: Continue aspiration precaution .speech therapist was following.  Currently on dysphagia 1 diet.  Nutritionist following  Disposition/goals of care: PT/OT recommended skilled nursing facility but her insurance declined.  Family appealing.  We recommended hospice approach but family divided on that.  Palliative care assisting.  . Pressure Injury 06/17/22 Coccyx Mid;Lower Unstageable - Full thickness tissue loss in which the base of the injury is covered by  slough (yellow, tan, gray, green or brown) and/or eschar (tan,  brown or black) in the wound bed. (Active)  06/17/22 1500  Location: Coccyx  Location Orientation: Mid;Lower  Staging: Unstageable - Full thickness tissue loss in which the base of the injury is covered by slough (yellow, tan, gray, green or brown) and/or eschar (tan, brown or black) in the wound bed.  Wound Description (Comments):   Present on Admission: Yes        Nutrition Problem: Inadequate oral intake Etiology: dysphagia, lethargy/confusion Pressure Injury 06/17/22 Coccyx Mid;Lower Unstageable - Full thickness tissue loss in which the base of the injury is covered by slough (yellow, tan, gray, green or brown) and/or eschar (tan, brown or black) in the wound bed. (Active)  06/17/22 1500  Location: Coccyx  Location Orientation: Mid;Lower  Staging: Unstageable - Full thickness tissue loss in which the base of the injury is covered by slough (yellow, tan, gray, green or brown) and/or eschar (tan, brown or black) in the wound bed.  Wound Description (Comments):   Present on Admission: Yes  Dressing Type Foam - Lift dressing to assess site every shift 06/30/22 2000    DVT prophylaxis:Place TED hose Start: 06/21/22 1218 apixaban (ELIQUIS) tablet 5 mg     Code Status: DNR  Family Communication: called and discussed with son [name] on phone on 7/6  Patient status:Inpatient  Patient is from SNF  Anticipated discharge to:SNF vs Hospice  Estimated DC date: Not sure   Consultants: Palliative care  Procedures: None  Antimicrobials:  Anti-infectives (From admission, onward)    Start     Dose/Rate Route Frequency Ordered Stop   06/04/2022 2030  meropenem (MERREM) 1 g in sodium chloride 0.9 % 100 mL IVPB  Status:  Discontinued        1 g 200 mL/hr over 30 Minutes Intravenous Every 12 hours 05/30/2022 2016 06/18/22 1927   06/15/2022 1815  piperacillin-tazobactam (ZOSYN) IVPB 3.375 g        3.375 g 100 mL/hr over 30  Minutes Intravenous  Once 06/05/2022 1813 06/05/2022 1938   06/11/2022 1600  ceFEPIme (MAXIPIME) 2 g in sodium chloride 0.9 % 100 mL IVPB  Status:  Discontinued        2 g 200 mL/hr over 30 Minutes Intravenous  Once 06/13/2022 1552 06/22/2022 1609   06/20/2022 1600  metroNIDAZOLE (FLAGYL) IVPB 500 mg  Status:  Discontinued        500 mg 100 mL/hr over 60 Minutes Intravenous  Once 06/02/2022 1552 05/30/2022 1609   06/04/2022 1600  vancomycin (VANCOCIN) IVPB 1000 mg/200 mL premix  Status:  Discontinued        1,000 mg 200 mL/hr over 60 Minutes Intravenous  Once 06/12/2022 1552 06/18/2022 1609       Subjective: Patient seen and examined at the bedside this morning.  She was being cleaned.  Not any apparent distress but appears very deconditioned, weak, confused.  Hemodynamically stable.  Continues to have poor oral intake  Objective: Vitals:   07/02/22 2100 07/02/22 2104 07/02/22 2235 07/03/22 0800  BP: 131/76   124/77  Pulse:  (!) 107 100 93  Resp:  (!) 24  (!) 21  Temp:    97.8 F (36.6 C)  TempSrc:    Axillary  SpO2:  93%  94%  Weight:      Height:        Intake/Output Summary (Last 24 hours) at 07/03/2022 1140 Last data filed at 07/03/2022 0823 Gross per 24 hour  Intake 506 ml  Output 1050 ml  Net -544 ml  Filed Weights   06/30/22 0504 07/01/22 0500 07/02/22 0300  Weight: 66.2 kg 66 kg 66.4 kg    Examination:  General exam: Very deconditioned, chronically looking, extremely weak, confused HEENT: PERRL Respiratory system:  no wheezes or crackles  Cardiovascular system: Irregularly irregular rhythm.  Gastrointestinal system: Abdomen is nondistended, soft and nontender. Central nervous system: Alert and awake but not oriented Extremities: Bilateral upper extremity edema, no edema in the lower extremities, no clubbing ,no cyanosis Skin: No rashes,no icterus     Data Reviewed: I have personally reviewed following labs and imaging studies  CBC: Recent Labs  Lab 06/26/22 2209  06/30/22 0720 07/02/22 0253 07/03/22 0333  WBC 10.8* 12.2* 12.6* 10.9*  HGB 10.3* 10.3* 9.9* 10.4*  HCT 32.3* 33.3* 32.6* 34.9*  MCV 91.0 92.5 93.7 93.6  PLT 150 PLATELET CLUMPS NOTED ON SMEAR, UNABLE TO ESTIMATE 173 093*   Basic Metabolic Panel: Recent Labs  Lab 06/28/22 0359 06/30/22 0720 07/01/22 0209 07/02/22 0253 07/03/22 0333  NA 140 142 141 141 144  K 4.2 5.2* 5.0 5.2* 4.8  CL 101 105 101 105 108  CO2 _0 GLUCOSE 111* 106* 130* 115* 114*  BUN 59* 77* 92* 107* 94*  CREATININE 0.90 0.98 1.22* 1.09* 0.93  CALCIUM 8.7* 8.6* 9.0 8.7* 8.9     Recent Results (from the past 240 hour(s))  Culture, blood (Routine X 2) w Reflex to ID Panel     Status: None   Collection Time: 06/26/22 10:07 PM   Specimen: BLOOD  Result Value Ref Range Status   Specimen Description BLOOD LEFT ANTECUBITAL  Final   Special Requests   Final    BOTTLES DRAWN AEROBIC AND ANAEROBIC Blood Culture adequate volume   Culture   Final    NO GROWTH 5 DAYS Performed at [facility], [address], [place], [place] [address]    Report Status 07/01/2022 FINAL  Final  Culture, blood (Routine X 2) w Reflex to ID Panel     Status: Abnormal   Collection Time: 06/26/22 10:16 PM   Specimen: BLOOD LEFT HAND  Result Value Ref Range Status   Specimen Description BLOOD LEFT HAND  Final   Special Requests   Final    BOTTLES DRAWN AEROBIC ONLY Blood Culture results may not be optimal due to an inadequate volume of blood received in culture bottles   Culture  Setup Time   Final    GRAM POSITIVE COCCI IN CLUSTERS AEROBIC BOTTLE ONLY CRITICAL RESULT CALLED TO, READ BACK BY AND VERIFIED WITH: PHARMD GREG ABBOTT 06/28/22_1 :65 BY TW    Culture (A)  Final    STAPHYLOCOCCUS EPIDERMIDIS THE SIGNIFICANCE OF ISOLATING THIS ORGANISM FROM A SINGLE SET OF BLOOD CULTURES WHEN MULTIPLE SETS ARE DRAWN IS UNCERTAIN. PLEASE NOTIFY THE MICROBIOLOGY DEPARTMENT WITHIN ONE WEEK IF SPECIATION AND SENSITIVITIES ARE  REQUIRED. Performed at [facility], [place] [address], [place], [place] [address]    Report Status 06/29/2022 FINAL  Final  Blood Culture ID Panel (Reflexed)     Status: Abnormal   Collection Time: 06/26/22 10:16 PM  Result Value Ref Range Status   Enterococcus faecalis NOT DETECTED NOT DETECTED Final   Enterococcus Faecium NOT DETECTED NOT DETECTED Final   Listeria monocytogenes NOT DETECTED NOT DETECTED Final   Staphylococcus species DETECTED (A) NOT DETECTED Final    Comment: CRITICAL RESULT CALLED TO, READ BACK BY AND VERIFIED WITH: PHARMD GREG ABBOTT 06/28/22_2 :53 BY TW  Staphylococcus aureus (BCID) NOT DETECTED NOT DETECTED Final   Staphylococcus epidermidis DETECTED (A) NOT DETECTED Final    Comment: Methicillin (oxacillin) resistant coagulase negative staphylococcus. Possible blood culture contaminant (unless isolated from more than one blood culture draw or clinical case suggests pathogenicity). No antibiotic treatment is indicated for blood  culture contaminants. CRITICAL RESULT CALLED TO, READ BACK BY AND VERIFIED WITH: PHARMD GREG ABBOTT 06/28/22_0 :39 BY TW    Staphylococcus lugdunensis NOT DETECTED NOT DETECTED Final   Streptococcus species NOT DETECTED NOT DETECTED Final   Streptococcus agalactiae NOT DETECTED NOT DETECTED Final   Streptococcus pneumoniae NOT DETECTED NOT DETECTED Final   Streptococcus pyogenes NOT DETECTED NOT DETECTED Final   A.calcoaceticus-baumannii NOT DETECTED NOT DETECTED Final   Bacteroides fragilis NOT DETECTED NOT DETECTED Final   Enterobacterales NOT DETECTED NOT DETECTED Final   Enterobacter cloacae complex NOT DETECTED NOT DETECTED Final   Escherichia coli NOT DETECTED NOT DETECTED Final   Klebsiella aerogenes NOT DETECTED NOT DETECTED Final   Klebsiella oxytoca NOT DETECTED NOT DETECTED Final   Klebsiella pneumoniae NOT DETECTED NOT DETECTED Final   Proteus species NOT DETECTED NOT DETECTED Final   Salmonella species NOT DETECTED NOT  DETECTED Final   Serratia marcescens NOT DETECTED NOT DETECTED Final   Haemophilus influenzae NOT DETECTED NOT DETECTED Final   Neisseria meningitidis NOT DETECTED NOT DETECTED Final   Pseudomonas aeruginosa NOT DETECTED NOT DETECTED Final   Stenotrophomonas maltophilia NOT DETECTED NOT DETECTED Final   Candida albicans NOT DETECTED NOT DETECTED Final   Candida auris NOT DETECTED NOT DETECTED Final   Candida glabrata NOT DETECTED NOT DETECTED Final   Candida krusei NOT DETECTED NOT DETECTED Final   Candida parapsilosis NOT DETECTED NOT DETECTED Final   Candida tropicalis NOT DETECTED NOT DETECTED Final   Cryptococcus neoformans/gattii NOT DETECTED NOT DETECTED Final   Methicillin resistance mecA/C DETECTED (A) NOT DETECTED Final    Comment: CRITICAL RESULT CALLED TO, READ BACK BY AND VERIFIED WITH: PHARMD GREG ABBOTT 06/28/22_1 :53 BY TW Performed at [facility], [address], [place], [place] [address]      Radiology Studies: No results found.  Scheduled Meds:  allopurinol  100 mg Oral Daily   apixaban  5 mg Oral BID   Chlorhexidine Gluconate Cloth  6 each Topical Daily   diltiazem  30 mg Oral Q6H   donepezil  10 mg Oral QHS   feeding supplement  237 mL Oral TID BM   hydrocerin   Topical BID   leptospermum manuka honey  1 Application Topical Daily   memantine  5 mg Oral BID   metoprolol tartrate  50 mg Oral BID   multivitamin with minerals  1 tablet Oral Daily   mouth rinse  15 mL Mouth Rinse 4 times per day   pantoprazole  40 mg Oral Daily   sodium chloride flush  3 mL Intravenous Q12H   Continuous Infusions:     LOS: 17 days   Shelly Coss, MD [facility] P7/05/2022, 11:40 AM

## 2022-07-03 NOTE — TOC Progression Note (Signed)
Transition of Care [facility]) - Progression Note    Patient Details  Name: [name] Dyer MRN: [id number] Date of Birth: [birth year]  Transition of Care [facility]) CM/SW [place], [place] Phone Number: 07/03/2022, 4:01 PM  Clinical Narrative:     Appeal shows as "pending" in online portal. CSW called insurance and unable to get any additional information other than that appeal is pending.   Expected Discharge Plan: Skilled Nursing Facility Barriers to Discharge: Family Issues, SNF Authorization Denied  Expected Discharge Plan and Services Expected Discharge Plan: [place] arrangements for the past 2 months: [place], Single Family Home                                       Social Determinants of Health (SDOH) Interventions    Readmission Risk Interventions    05/31/2022    5:13 PM  Readmission Risk Prevention Plan  Transportation Screening Complete  Medication Review (RN Care Manager) Referral to Pharmacy  PCP or Specialist appointment within 3-5 days of discharge Complete  HRI or Home Care Consult Complete  SW Recovery Care/Counseling Consult Complete  [place] Complete

## 2022-07-03 NOTE — Progress Notes (Addendum)
Nutrition Follow-up  DOCUMENTATION CODES:   Not applicable  INTERVENTION:   Continue current diet as ordered per SLP; son feeding pt supper each night and encourages supplements  Ensure Enlive po TID, each supplement provides 350 kcal and 20 grams of protein. MVI with minerals daily Magic cup TID with meals, each supplement provides 290 kcal and 9 grams of protein   NUTRITION DIAGNOSIS:   Inadequate oral intake related to dysphagia, lethargy/confusion as evidenced by meal completion < 50%. Ongoing.   GOAL:   Patient will meet greater than or equal to 90% of their needs Progressing with meal assistance from son  MONITOR:   PO intake, Supplement acceptance, Weight trends, I & O's  REASON FOR ASSESSMENT:   Consult Assessment of nutrition requirement/status  ASSESSMENT:   Pt with hx of atrial fibrillation, alzheimer's dementia, CHF, and CKD3 presented to ED from nursing home with fever and lethargy. Found to be septic on admission  Palliative care meeting with family, per [name] would like to continue aggressive care and is pursuing SNF.    Pt does not respond to questions.  SLP following, pt is total assist for feeding and per notes pt needs additional time for swallow and possibly suction if food not swallowed in reasonable time.   PO intake records show meal completion typically <50%  Spoke with pt's son [name]. He feeds pt dinner and reports that he usually offers a sip of liquid after each bite and that sometimes pt does not want to open her mouth to eat. He shares that he knows she does not eat much during the day and tries to get the pt as much nutrition as possible in the evening. The feeding process can take a few hours. He tries to give her the protein on her meal tray first and then 2 magic cups followed by ensure to get in as many kcal as possible. I explained that an outside facility would have the same feeding issues we have had and that he or his brother would  most likely need to continue to feed her and offer supplements to meet her needs. I also explained that has her medical conditions progress that she may eat less and require more oral nutrition supplements to meet her needs.   Medications reviewed and include: ensure enlive TID, MVI with minerals, protonix,   Labs reviewed  Diet Order:   Diet Order             DIET - DYS 1 Room service appropriate? Yes; Fluid consistency: Thin  Diet effective now                   EDUCATION NEEDS:   Not appropriate for education at this time  Skin:  Skin Assessment: Reviewed RN Assessment (scattered bruising, unstagable pressure injury to the coccyx)  Last BM:  7/4 small, type 4  Height:   Ht Readings from Last 1 Encounters:  06/17/22 5' 1" (1.549 m)    Weight:   Wt Readings from Last 1 Encounters:  07/02/22 66.4 kg    Ideal Body Weight:  47.7 kg  BMI:  Body mass index is 27.66 kg/m.  Estimated Nutritional Needs:   Kcal:  [address]23-3435 kcal/d  Protein:  70-85g/d  Fluid:  1.5-1.6L/d  Lockie Pares., RD, LDN, CNSC See AMiON for contact information

## 2022-07-03 NOTE — Progress Notes (Signed)
Daily Progress Note   Patient Name: [name] Dyer       Date: 07/03/2022 DOB: 01-May-1932  Age: 88 y.o. MRN#: [id number] Attending Physician: Shelly Coss, MD Primary Care Physician: [name], NP Admit Date: 06/03/2022  Reason for Consultation/Follow-up: Establishing goals of care  Subjective: Patient minimally responsive to me. Moans when I ask her questions. When I ask about pain she does indicate "no". No family at bedside during my assessment.   Length of Stay: 17  Current Medications: Scheduled Meds:   allopurinol  100 mg Oral Daily   apixaban  5 mg Oral BID   Chlorhexidine Gluconate Cloth  6 each Topical Daily   diltiazem  30 mg Oral Q6H   donepezil  10 mg Oral QHS   feeding supplement  237 mL Oral TID BM   hydrocerin   Topical BID   leptospermum manuka honey  1 Application Topical Daily   memantine  5 mg Oral BID   metoprolol tartrate  50 mg Oral BID   multivitamin with minerals  1 tablet Oral Daily   mouth rinse  15 mL Mouth Rinse 4 times per day   pantoprazole  40 mg Oral Daily   sodium chloride flush  3 mL Intravenous Q12H    Continuous Infusions:   PRN Meds: acetaminophen **OR** acetaminophen, Muscle Rub, ondansetron **OR** ondansetron (ZOFRAN) IV, mouth rinse  Physical Exam Constitutional:      General: She is not in acute distress.    Appearance: She is ill-appearing.     Comments: Minimally interactive  Skin:    General: Skin is warm and dry.  Neurological:     Mental Status: She is disoriented.             Vital Signs: BP 124/77 (BP Location: Left Arm)   Pulse 93   Temp 97.8 F (36.6 C) (Axillary)   Resp (!) 21   Ht 5' 1" (1.549 m)   Wt 66.4 kg   SpO2 94%   BMI 27.66 kg/m  SpO2: SpO2: 94 % O2 Device: O2 Device: Nasal Cannula O2 Flow Rate: O2  Flow Rate (L/min): 4 L/min  Intake/output summary:  Intake/Output Summary (Last 24 hours) at 07/03/2022 1553 Last data filed at 07/03/2022 2706 Gross per 24 hour  Intake 506 ml  Output 1050 ml  Net -544 ml   LBM: Last BM Date : 07/01/22 Baseline Weight: Weight: 58 kg Most recent weight: Weight: 66.4 kg       Palliative Assessment/Data: PPS 20%      Patient Active Problem List   Diagnosis Date Noted   Orthostatic hypotension 06/24/2022   Acute urinary retention 06/24/2022   Dysphagia 06/24/2022   Thrombocytopenia ([place]) 06/24/2022   Pressure injury of skin 06/18/2022   Acute encephalopathy 05/30/2022   Acute respiratory failure with hypoxia (HCC) 06/27/2022   Sepsis due to gram-negative UTI (HCC) 06/12/2022   Hypernatremia    Acute CVA (cerebrovascular accident) ([place]) 04/22/2022   CVA (cerebral vascular accident) ([place]) 04/22/2022   Urinary tract infection due to ESBL Klebsiella 04/22/2022   Acute renal failure superimposed on stage 3b chronic kidney disease ([place]) 04/22/2022   Hypertensive heart and kidney disease with chronic diastolic  congestive heart failure and stage 4 chronic kidney disease ([place]) 04/20/2022   Dyspnea on minimal exertion 01/08/2022   Atypical angina (HCC) 01/08/2022   Neuropathy 07/17/2021   Hand joint pain 12/24/2020   Chronic dermatitis 11/24/2020   Acute cystitis 05/22/2020   Chronic back pain 05/22/2020   Cystocele without uterine prolapse 05/22/2020   Pain of toe of left foot 04/26/2020   CHF (congestive heart failure) (HCC) 01/30/2020   Chronic rhinitis 11/09/2019   Facial pain syndrome 11/09/2019   Rash 11/09/2019   Temporal arteritis ([place]) 11/09/2019   Chronic anticoagulation 06/24/2018   Peripheral neuropathy 06/24/2018   Alzheimer's disease ([place]) 04/08/2018   Insomnia 04/08/2018   Osteoporosis 04/08/2018   Memory difficulty 10/01/2016   Colitis presumed infectious 06/06/2016   Acute on chronic diastolic CHF (congestive heart failure)  ([place]) 06/06/2016   Permanent atrial fibrillation ([place]) 06/06/2016   Hypertension 06/06/2016   Hyperlipidemia 06/06/2016   Diarrhea 06/04/2016   Anemia 04/27/2012    Palliative Care Assessment & Plan   HPI: 88 y.o. female  with past medical history of permanent A-fib, dementia, chronic congestive heart failure, CKD stage IIIb, and recent hospitalization for ESBL UTI  admitted on 06/21/2022 with poor PO intake. Chest x-ray showed cardiomegaly, moderate right-sided pleural effusion.Patient continues with FTT and poor PO intake. Patient has been declined for rehab as she is unable to participate. PMT consulted to [place].   Assessment: Follow up today. No family at bedside.  Patient appears about the same as yesterday.  Discussed case at length with nurse-she has been educating family on patient's condition.  We discussed ongoing concern of aspiration.  Nurse has discussed this with family as well.  Called to son [name].  We reviewed conversation with patient's other son yesterday.  All questions and concerns answered.  [name] is unsure how we move forward.  We discussed that we continue to wait for [name] appeal for rehab and that the palliative team will continue to follow along and try to assist.  [name] has our number and will call with any questions or concerns.  Recommendations/Plan: PMT will follow Son [name] would agree to hospice care however son [name] does not -he continues to await rehab appeal  Code Status: DNR  Care plan was discussed with patient's son [name] and RN  Thank you for allowing the Palliative Medicine Team to assist in the care of this patient.    *Please note that this is a verbal dictation therefore any spelling or grammatical errors are due to the "[place]" system interpretation.  Juel Burrow, DNP, [facility] Palliative Medicine Team Team Phone # [phone]  Pager [phone]

## 2022-07-04 DIAGNOSIS — I5030 Unspecified diastolic (congestive) heart failure: Secondary | ICD-10-CM

## 2022-07-04 DIAGNOSIS — I4821 Permanent atrial fibrillation: Secondary | ICD-10-CM | POA: Diagnosis not present

## 2022-07-04 DIAGNOSIS — G309 Alzheimer's disease, unspecified: Secondary | ICD-10-CM | POA: Diagnosis not present

## 2022-07-04 DIAGNOSIS — I4891 Unspecified atrial fibrillation: Secondary | ICD-10-CM

## 2022-07-04 DIAGNOSIS — G9340 Encephalopathy, unspecified: Secondary | ICD-10-CM | POA: Diagnosis not present

## 2022-07-04 DIAGNOSIS — I5033 Acute on chronic diastolic (congestive) heart failure: Secondary | ICD-10-CM | POA: Diagnosis not present

## 2022-07-04 MED ORDER — DILTIAZEM HCL-DEXTROSE 125-5 MG/125ML-% IV SOLN (PREMIX)
5.0000 mg/h | INTRAVENOUS | Status: DC
  Administered 2022-07-04: 5 mg/h via INTRAVENOUS
  Filled 2022-07-04: qty 125

## 2022-07-04 MED ORDER — DILTIAZEM HCL ER COATED BEADS 240 MG PO CP24: 240.0000 mg | ORAL_CAPSULE | Freq: Every day | ORAL | Status: DC

## 2022-07-04 NOTE — TOC Progression Note (Signed)
Transition of Care [facility]) - Progression Note    Patient Details  Name: [name] Dyer MRN: [id number] Date of Birth: 12-02-1932  Transition of Care [facility]) CM/SW [place], [place] Phone Number: 07/04/2022, 2:24 PM  Clinical Narrative:     Appeal is still pending as of 1425.   Expected Discharge Plan: Skilled Nursing Facility Barriers to Discharge: Family Issues, SNF Authorization Denied  Expected Discharge Plan and Services Expected Discharge Plan: [place] arrangements for the past 2 months: [place], Single Family Home                                       Social Determinants of Health (SDOH) Interventions    Readmission Risk Interventions    05/31/2022    5:13 PM  Readmission Risk Prevention Plan  Transportation Screening Complete  Medication Review (RN Care Manager) Referral to Pharmacy  PCP or Specialist appointment within 3-5 days of discharge Complete  HRI or Home Care Consult Complete  SW Recovery Care/Counseling Consult Complete  [place] Complete

## 2022-07-04 NOTE — Progress Notes (Addendum)
Speech Language Pathology Treatment: Dysphagia  Patient Details Name: [name] Dyer MRN: [id number] DOB: July 31, 1932 Today's Date: 07/04/2022 Time: 8811-0315 SLP Time Calculation (min) (ACUTE ONLY): 20 min  Assessment / Plan / Recommendation Clinical Impression  RN reported increased pocketing that she removed yesterday and coughing with thin liquids. Her swallowing appears to be declining somewhat as is to be expected with advanced dementia. Today she was able to extract gingerale from straw with feeder removing straw to decreased intake. Mild to sometimes moderate transit delays. She did show signs of aspiration with immediate mild cough and throat clear for 70% of trials. Controlling her volume mitigates her risk which is high given level of dementia and will only progress. Recommend she continue puree diet texture, thin liquids with straw limiting volume. RN crushed meds and mixed in gingerale ale. RN and therapist discussed having meds switched to IV if able. Sit upright, allow extra time for swallow and check oral cavity for pocketed food. No family at bedside. Continue ST for family education and swallow precautions.    HPI HPI: [name] Dyer is an 88 yo female who was admitted to the hospital with the working diagnosis of acute metabolic encephalopathy. Pt with the past medical history of atrial fibrillation, dementia, chronic heart failure, CKD stage 3b, recent hospitalization for ESBL Klebsiella urine infection (05/29/22 to 06/09/22). Patient was discharged to SNF with severe deconditioning. Post discharge patient had very poor oral intake, increase 02 requirements for 2 to 3 days and fever the day prior to rehospitalization.  CXR 6/19: "1. Cardiomegaly with vascular congestion bilateral pleural  effusions. Airspace disease at the bases could be due to atelectasis  or pneumonia  2. Hazy asymmetric opacity right thorax could be due to pleural  effusion versus asymmetric edema". Pt was  discharged from speech on a Dys 3 diet. Re-ordered today due to significant pocketing.      SLP Plan  Continue with current plan of care      Recommendations for follow up therapy are one component of a multi-disciplinary discharge planning process, led by the attending physician.  Recommendations may be updated based on patient status, additional functional criteria and insurance authorization.    Recommendations  Diet recommendations: Thin liquid;Dysphagia 1 (puree) Liquids provided via: Straw;Cup Medication Administration: Crushed with puree Supervision: Trained caregiver to feed patient Compensations: Minimize environmental distractions;Slow rate;Small sips/bites;Lingual sweep for clearance of pocketing;Monitor for anterior loss;Other (Comment);Follow solids with liquid Postural Changes and/or Swallow Maneuvers: Seated upright 90 degrees                Oral Care Recommendations: Oral care BID Follow Up Recommendations: Skilled nursing-short term rehab (<3 hours/day) Assistance recommended at discharge: Frequent or constant Supervision/Assistance SLP Visit Diagnosis: Dysphagia, oropharyngeal phase (R13.12) Plan: Continue with current plan of care           [name] Dyer  07/04/2022, 12:51 PM

## 2022-07-04 NOTE — Progress Notes (Addendum)
Son [name] Dyer at the bedside feeding his mother. He states "I have been asking for something for pain because all they would give her was tylenol. I wanted something for pain but he was under the impression that the patient could not have anything stronger than tylenol and I do not want them to give her anything to hasten her demise." He said he appreciated me giving her something and it helped with her breathing.

## 2022-07-04 NOTE — Consult Note (Addendum)
Cardiology Consultation:   Patient ID: [name] Dyer MRN: [id number]; DOB: 1932-09-19  Admit date: 06/05/2022 Date of Consult: 07/04/2022  PCP:  [name], NP   [facility] Providers Cardiologist:  [name], MD        Patient Profile:   [name] Dyer is a 88 y.o. female with a hx of advanced dementia, permanent atrial fibrillation, HFpEF, CKD who is being seen 07/04/2022 for the evaluation of atrial fibrillation at the request of Dr Shela Leff.  History of Present Illness:   [name] Dyer was recnetly admitted for ESBL UTI, and after SNF visit, readmitted for ADHF and hypoxia. Diuresed to euvolemia by hospitalist team. Intermittent AF RVR, received IV diltiazem pushes during stay. Hospital course complicated by poor oral intake and failure to thrive. Following with palliative care with hospice offered, family desires continued aggressive care although she is now DNR.  Several hours of atrial fibrillation with RVR averaging in the 120s, cardiology consulted. Patient has been intermittenly refusing to eat, including taking crushed pills in her food Patient non-communicative, unable to get history EKG with Afib RVR, iRBBB with rate related widening to RBBB Tele HR 120-130 bpm  Past Medical History:  Diagnosis Date   Alzheimer disease ([place])    Anemia    Arrhythmia    Cataract    Colon polyps    Community acquired pneumonia 01/2012   LLL   Congestive heart failure ([place])    Hyperlipidemia    Hypertension    Memory difficulty 10/01/2016   Osteoporosis    Permanent atrial fibrillation ([facility])    Pleural effusion, right 01/2012   SVT (supraventricular tachycardia) ([place]) 01/2012   UTI (lower urinary tract infection)     Past Surgical History:  Procedure Laterality Date   [place]   COLONOSCOPY     COLONOSCOPY N/A 06/10/2016   Procedure: COLONOSCOPY;  Surgeon: Manus Gunning, MD;  Location: [facility];   Service: Gastroenterology;  Laterality: N/A;   ESOPHAGOGASTRODUODENOSCOPY N/A 06/10/2016   Procedure: ESOPHAGOGASTRODUODENOSCOPY (EGD);  Surgeon: Manus Gunning, MD;  Location: [facility];  Service: Gastroenterology;  Laterality: N/A;   HEMIARTHROPLASTY HIP  2011   Left femoral neck fracture   RIGHT/LEFT HEART CATH AND CORONARY ANGIOGRAPHY N/A 01/08/2022   Procedure: RIGHT/LEFT HEART CATH AND CORONARY ANGIOGRAPHY;  Surgeon: Leonie Man, MD;  Location: [facility];  Service: Cardiovascular;  Laterality: N/A;   UPPER GASTROINTESTINAL ENDOSCOPY         Inpatient Medications: Scheduled Meds:  allopurinol  100 mg Oral Daily   apixaban  5 mg Oral BID   Chlorhexidine Gluconate Cloth  6 each Topical Daily   donepezil  10 mg Oral QHS   feeding supplement  237 mL Oral TID BM   hydrocerin   Topical BID   leptospermum manuka honey  1 Application Topical Daily   memantine  5 mg Oral BID   multivitamin with minerals  1 tablet Oral Daily   mouth rinse  15 mL Mouth Rinse 4 times per day   pantoprazole  40 mg Oral Daily   sodium chloride flush  3 mL Intravenous Q12H   Continuous Infusions:  diltiazem (CARDIZEM) infusion     PRN Meds: acetaminophen **OR** acetaminophen, morphine injection, Muscle Rub, ondansetron **OR** ondansetron (ZOFRAN) IV, mouth rinse, oxyCODONE  Allergies:    Allergies  Allergen Reactions   Ciprofloxacin Diarrhea   Sulfa Antibiotics Hives, Rash and Other (See Comments)  Severe Rash   Nitrofurantoin Rash and Other (See Comments)    Developed severe rash on both legs   Memantine Other (See Comments)    Dizziness. Pt takes this medication at home   Succinylsulphathiazole Rash   Sulfamethoxazole Rash    Social History:   Social History   Socioeconomic History   Marital status: Widowed    Spouse name: Not on file   Number of children: 2   Years of education: 15   Highest education level: Not on file  Occupational History   Occupation:  Retired  Tobacco Use   Smoking status: Never   Smokeless tobacco: Never  Vaping Use   Vaping Use: Never used  Substance and Sexual Activity   Alcohol use: Never   Drug use: Never   Sexual activity: Not on file  Other Topics Concern   Not on file  Social History Narrative   Diet: low sodium/ fluid      Caffeine:Green tea, cheerwine,chocolate      Married, if yes what year: [id number]      Do you live in a house, apartment, assisted living, condo, trailer, ect: House      Is it one or more stories: One      How many persons live in your home? 1      Pets: No      Highest level or education completed: High School      Current/Past profession: [place] [employer]      Exercise: Yes                 Type and how often: Walking, strength training          Living Will: Yes    DNR: No   POA/HPOA: Yes      Functional Status:   Do you have difficulty bathing or dressing yourself? No   Do you have difficulty preparing food or eating? No   Do you have difficulty managing your medications? Yes   Do you have difficulty managing your finances? Yes   Do you have difficulty affording your medications? Yes   Social Determinants of Health   Financial Resource Strain: Not on file  Food Insecurity: Not on file  Transportation Needs: Not on file  Physical Activity: Not on file  Stress: Not on file  Social Connections: Not on file  Intimate Partner Violence: Not on file    Family History:    Family History  Problem Relation Age of Onset   Heart Problems Mother    Osteoporosis Mother    Stroke Father    Heart Problems Father    Other Brother 63       COVID   Colon polyps Son    Parkinson's disease Son    Neuropathy Son    Heart disease Son    Cancer Grandchild      ROS:  Please see the history of present illness.   All other ROS reviewed and negative.     Physical Exam/Data:   Vitals:   07/03/22 1930 07/04/22 1009 07/04/22 2000 07/04/22 2231  BP:  104/78 (!) 124/98 109/89 114/87  Pulse: 95 97 94 (!) 106  Resp: (!) 32 (!) 27 (!) 27 (!) 25  Temp: (!) 100.4 F (38 C) 98.1 F (36.7 C) 99.9 F (37.7 C)   TempSrc: Axillary Axillary Axillary   SpO2: 91% 95% 90% 94%  Weight:      Height:        Intake/Output  Summary (Last 24 hours) at 07/04/2022 2307 Last data filed at 07/04/2022 1618 Gross per 24 hour  Intake 0 ml  Output 1000 ml  Net -1000 ml      07/02/2022    3:00 AM 07/01/2022    5:00 AM 06/30/2022    5:04 AM  Last 3 Weights  Weight (lbs) 146 lb 6.2 oz 145 lb 7.7 oz 146 lb  Weight (kg) 66.4 kg 65.99 kg 66.225 kg     Body mass index is 27.66 kg/m.  General:  Non-responsive to voice, grimaces to pain HEENT: normal Neck: no JVD Vascular: No carotid bruits; Distal pulses 2+ bilaterally Cardiac:  irregular irregular at 125 bpm Lungs:  clear to auscultation bilaterally, no wheezing, rhonchi or rales  Abd: soft, nontender, no hepatomegaly  Ext: no edema Musculoskeletal:  No deformities, BUE and BLE strength normal and equal Skin: warm and dry  Neuro:  CNs 2-12 intact, no focal abnormalities noted Psych:  Normal affect    Laboratory Data:  High Sensitivity Troponin:  No results for input(s): "TROPONINIHS" in the last 720 hours.   Chemistry Recent Labs  Lab 07/01/22 0209 07/02/22 0253 07/03/22 0333  NA 141 141 144  K 5.0 5.2* 4.8  CL 101 105 108  CO2 _0 GLUCOSE 130* 115* 114*  BUN 92* 107* 94*  CREATININE 1.22* 1.09* 0.93  CALCIUM 9.0 8.7* 8.9  GFRNONAA 43* 49* 59*  ANIONGAP _1 No results for input(s): "PROT", "ALBUMIN", "AST", "ALT", "ALKPHOS", "BILITOT" in the last 168 hours. Lipids No results for input(s): "CHOL", "TRIG", "HDL", "LABVLDL", "LDLCALC", "CHOLHDL" in the last 168 hours.  Hematology Recent Labs  Lab 06/30/22 0720 07/02/22 0253 07/03/22 0333  WBC 12.2* 12.6* 10.9*  RBC 3.60* 3.48* 3.73*  HGB 10.3* 9.9* 10.4*  HCT 33.3* 32.6* 34.9*  MCV 92.5 93.7 93.6  MCH 28.6 28.4 27.9  MCHC  30.9 30.4 29.8*  RDW 20.6* 20.9* 20.9*  PLT PLATELET CLUMPS NOTED ON SMEAR, UNABLE TO ESTIMATE 173 140*   Thyroid No results for input(s): "TSH", "FREET4" in the last 168 hours.  BNPNo results for input(s): "BNP", "PROBNP" in the last 168 hours.  DDimer No results for input(s): "DDIMER" in the last 168 hours.   Radiology/Studies:  No results found.   Assessment and Plan:   Atrial fibrillation with RVR: [name] Dyer has advanced dementia at baseline, and I suspect her not taking her home diltiazem and metoprolol is causing rapid ventricular response. It is also possible there is a provoking factor like pain or sepsis helping drive the RVR. In the short term, permissive tachycardia to the 120s-130s is acceptable. If the family were to plan for comfort care, which I think is reasonable, I would recommend turning off telemetry and allowing permissive tachycardia. If continued aggressive care is the goal, a Dobhoff may be necessary to give oral medications including rate control medications. If family is insistent on controlling heart rate immediately, okay to start cardizem gtt in the short term if blood pressure allows. Rhythm control not likely to be helpful since afib is permanent for over a decade. Restart home eliquis once enteral access regained  HFpEF: Appears euvolemic. No need for IV diuretics at this time. Would repeat BNP.     Risk Assessment/Risk Scores:        New York Heart Association (NYHA) Functional Class NYHA Class III  CHA2DS2-VASc Score = 5   This indicates a 7.2% annual risk of stroke. The patient's  score is based upon: CHF History: 1 HTN History: 1 Diabetes History: 0 Stroke History: 0 Vascular Disease History: 0 Age Score: 2 Gender Score: 1         For questions or updates, please contact [place] Please consult [email or web address] for contact info under    Signed, [place] Karion Cudd, MD  07/04/2022 11:07 PM

## 2022-07-04 NOTE — Progress Notes (Signed)
Palliative Care Progress Note, Assessment & Plan   Patient Name: [name] Dyer       Date: 07/04/2022 DOB: 11-28-1932  Age: 88 y.o. MRN#: [id number] Attending Physician: Shelly Coss, MD Primary Care Physician: [name], NP Admit Date: 06/25/2022  Reason for Consultation/Follow-up: Establishing goals of care  Subjective: Patient is sitting up in bed.  She does not acknowledge my presence.  She is not able to make her wishes known.  Her eyes are open but she does not track me fully.  [name] son [name] and his wife are at bedside.  HPI: 88 y.o. female  with past medical history of permanent A-fib, dementia, chronic congestive heart failure, CKD stage IIIb, and recent hospitalization for ESBL UTI  admitted on 06/26/2022 with poor PO intake. Chest x-ray showed cardiomegaly, moderate right-sided pleural effusion. Patient continues with FTT and poor PO intake. Patient has been declined for rehab as she is unable to participate. PMT consulted to [facility].   Summary of counseling/coordination of care: After reviewing the patient's chart and assessing the patient at bedside, I spoke with patient, her son [name], and his wife in regards to prognosis.  Patient appears miserable and uncomfortable.  Nurse and myself repositioned patient in bed.  As removed patient, she made only vocalization during my visit and she appeared to be in pain.  Discussed patient's prognosis is poor and that patient is declining significantly.  Education provided on tachypnea, tachycardia, and use of Martin to alleviate breathlessness, work of breathing, and pain.  Discussed nonverbal/physical signs of pain.  Reviewed grimacing, fidgeting, and furrowing of brow.  Therapeutic silence and active listening provided for [name]  and his wife to share their thoughts and emotions regarding patient's current health status.  [name] wife shares that they understand from previous conversations with providers that the patient is not getting better and that she has reached the "end-stage".  I shared my concerns that patient is not improving but instead declining.  [name] shares that he believes that she is repositioned and she will feel better.  I discussed that in addition to repositioning, we can use medications such as morphine and Ativan to help relieve patient suffering.  Reviewed MAR and shared that patient has already been receiving Tylenol and morphine for pain.  Discussed human suffering, failure to thrive, and human mortality.  [name] asked appropriate questions regarding hospice care, artificial hydration, and continuing of heart medications should patient elect hospice benefits.  [name] shares that his brother [name] is having significant difficulty excepting patients current health status.  Denies and [name] are dual HCPOA's.  Questions and concerns from [place] and his wife were addressed.  After visiting with the patient and family at bedside, I spoke with patient's other son [name] over the phone.  [name] has hopes that patient will recover and be able to transfer to either custodial care or SNF for rehab.  Discussed above with [name].  [name] maintains he does not want to discuss hospice options for his mother.  He wants to continue with current plan of care despite patient's progressive decline.  Questions and concerns were addressed.  Palliative medicine team will remain available to the patient and family throughout  her hospitalization.  Code Status: DNR  Prognosis: < 2 weeks  Discharge Planning: To Be Determined  Care plan was discussed with patient, patient's son [name], patient's DIL, RN Langley Gauss  Physical Exam Vitals reviewed.  Constitutional:      Appearance: She is ill-appearing.  HENT:      Head: Normocephalic and atraumatic.     Mouth/Throat:     Mouth: Mucous membranes are moist.  Cardiovascular:     Rate and Rhythm: Tachycardia present.     Pulses: Normal pulses.  Pulmonary:     Comments: tachypnea Abdominal:     Palpations: Abdomen is soft.  Musculoskeletal:     Comments: Generalized weakness  Skin:    Comments: Bilateral weeping of UEs  Neurological:     Comments: Nonverbal, not interactive, does not respond to questions             Palliative Assessment/Data: 20%    Total Time 50 minutes  Greater than 50%  of this time was spent counseling and coordinating care related to the above assessment and plan.  Thank you for allowing the Palliative Medicine Team to assist in the care of this patient.  [place] I[place]a Iha, FNP-BC Palliative Medicine Team Team Phone # [phone]

## 2022-07-04 NOTE — Progress Notes (Signed)
Overnight event  Patient with history of permanent A-fib and notified by RN that her heart rate is fluctuating between 120s to 160s.  Blood pressure 114/87.  She is on scheduled oral Cardizem and metoprolol but RN not sure if patient is actually swallowing the pills. Stat EKG done and showing A-fib with RVR, RBBB is not new. Echo done January 2023 showing EF 50 to 55%. Discussed with cardiology and started Cardizem gtt. Cardiology will consult, appreciate assistance.

## 2022-07-04 NOTE — Progress Notes (Signed)
PROGRESS NOTE  [name] Dyer  OYD:[id number] DOB: [birth year] DOA: 06/14/2022 PCP: [name], NP   Brief Narrative:  Patient is a 88 year old female with history of permanent A-fib, dementia, chronic congestive heart failure, CKD stage IIIb, recent hospitalization for ESBL UTI and was discharged to skilled nursing facility for severe deconditioning presented back with very poor oral intake, increased oxygen requirement.  She was found to be volume overloaded on presentation, tachycardic.  Chest x-ray showed cardiomegaly, moderate right-sided pleural effusion.  Hospital course remarkable for prolonged stay with failure to thrive, poor oral intake.  Goals of care discussed and patient made DNR.  PT/OT recommended SNF for discharge but her insurance declined due to her possibility of custodial status, she has not participated with therapy that much.  Family appealing, waiting for long-term care.  We have recommended hospice approach, palliative care consulted.  Family divided about goals of care and did not want to pursue hospice.  Palliative care continues to follow.  Awaiting SNF placement.  Very poor prognosis, patient is rapidly declining.    Assessment & Plan:  Principal Problem:   Acute encephalopathy Active Problems:   Acute renal failure superimposed on stage 3b chronic kidney disease (HCC)   Sepsis due to gram-negative UTI (HCC)   Acute on chronic diastolic CHF (congestive heart failure) (HCC)   Acute respiratory failure with hypoxia (HCC)   Alzheimer's disease ([place])   Hypertension   Permanent atrial fibrillation (HCC)   Acute urinary retention   Dysphagia   Orthostatic hypotension   Pressure injury of skin   Thrombocytopenia (HCC)   Acute metabolic encephalopathy/goals of care/history of dementia: Multifactorial secondary to delirium, dehydration, hypoxemia, hyponatremia, poor oral intake.  Most likely patient has progressive dementia that has worsened for last 6 months.   Not much interactive, has very poor oral intake.  We have recommended hospice placement.  Palliative care consulted.  Palliative care discussed with family, one of the sons not agreeable  hospice approach . maintain delirium precautions.  Continue dementia medications.  AKI on CKD stage IIIb/hyponatremia/electrolyte abnormalities: Very poor oral intake.  Currently appears euvolemic.  Hyperkalemia resolved with Lokelma.  Suspected sepsis: Blood cultures have shown Staph epidermidis, staph hominis.  Urine culture showed 10,000 colonies of Enterococcus faecalis.  Antibiotics have been discontinued now.  Has persistent leukocytosis but afebrile  Acute on chronic diastolic congestive heart failure: Echo in January 2023 showed preserved ejection fraction with mild reduction in the right ventricular systolic function.  Currently appears almost euvolemic.  Lungs are clear on auscultation, no lower extremity edema.  Has some edema on bilateral upper extremities  Acute respiratory failure with hypoxia: Chest x-ray showed patchy bilateral airspace disease, right greater than left,improving on the right since prior study.,small right pleural effusion.  on 3 L/min of oxygen  Permanent A-fib: Rate fluctuating.  Remains in A-fib.On  .Metoprolol,cardizem   She has required IV Cardizem for rate control during this hospitalization.  Xarelto switched to Eliquis for anticoagulation  Urine retention: Foley was placed during this hospitalization  Normocytic anemia: Currently hemoglobin stable in the range of 9-10.  Dysphagia: Continue aspiration precaution .speech therapist was following.  Currently on dysphagia 1 diet.  Poor oral intake.  Nutritionist following  Disposition/goals of care: PT/OT recommended skilled nursing facility but her insurance declined.  Family appealing.  We recommended hospice approach but family divided on that.  Palliative care assisting.  Patient is rapidly declining  . Pressure Injury  06/17/22 Coccyx Mid;Lower Unstageable - Full thickness  tissue loss in which the base of the injury is covered by slough (yellow, tan, gray, green or brown) and/or eschar (tan, brown or black) in the wound bed. (Active)  06/17/22 1500  Location: Coccyx  Location Orientation: Mid;Lower  Staging: Unstageable - Full thickness tissue loss in which the base of the injury is covered by slough (yellow, tan, gray, green or brown) and/or eschar (tan, brown or black) in the wound bed.  Wound Description (Comments):   Present on Admission: Yes        Nutrition Problem: Inadequate oral intake Etiology: dysphagia, lethargy/confusion Pressure Injury 06/17/22 Coccyx Mid;Lower Unstageable - Full thickness tissue loss in which the base of the injury is covered by slough (yellow, tan, gray, green or brown) and/or eschar (tan, brown or black) in the wound bed. (Active)  06/17/22 1500  Location: Coccyx  Location Orientation: Mid;Lower  Staging: Unstageable - Full thickness tissue loss in which the base of the injury is covered by slough (yellow, tan, gray, green or brown) and/or eschar (tan, brown or black) in the wound bed.  Wound Description (Comments):   Present on Admission: Yes  Dressing Type Foam - Lift dressing to assess site every shift 07/03/22 2000    DVT prophylaxis:Place TED hose Start: 06/21/22 1218 apixaban (ELIQUIS) tablet 5 mg     Code Status: DNR  Family Communication: called and discussed with son [name] on phone on 7/6  Patient status:Inpatient  Patient is from SNF  Anticipated discharge to:SNF vs Hospice  Estimated DC date: Not sure   Consultants: Palliative care  Procedures: None  Antimicrobials:  Anti-infectives (From admission, onward)    Start     Dose/Rate Route Frequency Ordered Stop   06/24/2022 2030  meropenem (MERREM) 1 g in sodium chloride 0.9 % 100 mL IVPB  Status:  Discontinued        1 g 200 mL/hr over 30 Minutes Intravenous Every 12 hours 06/11/2022 2016  06/18/22 1927   06/10/2022 1815  piperacillin-tazobactam (ZOSYN) IVPB 3.375 g        3.375 g 100 mL/hr over 30 Minutes Intravenous  Once 06/12/2022 1813 06/04/2022 1938   06/06/2022 1600  ceFEPIme (MAXIPIME) 2 g in sodium chloride 0.9 % 100 mL IVPB  Status:  Discontinued        2 g 200 mL/hr over 30 Minutes Intravenous  Once 06/25/2022 1552 06/18/2022 1609   05/29/2022 1600  metroNIDAZOLE (FLAGYL) IVPB 500 mg  Status:  Discontinued        500 mg 100 mL/hr over 60 Minutes Intravenous  Once 06/10/2022 1552 06/05/2022 1609   06/01/2022 1600  vancomycin (VANCOCIN) IVPB 1000 mg/200 mL premix  Status:  Discontinued        1,000 mg 200 mL/hr over 60 Minutes Intravenous  Once 06/18/2022 1552 06/04/2022 1609       Subjective: Patient seen and examined at the bedside this morning.  Lying in bed, very weak, hardly able to move.  Opens her eyes on calling her name.  EKG monitor showed A-fib and the rate of 110s.  Extremely deconditioned and looks sick  Objective: Vitals:   07/03/22 0800 07/03/22 1736 07/03/22 1930 07/04/22 1009  BP: 124/77 106/83 104/78 (!) 124/98  Pulse: 93  95 97  Resp: (!) 21  (!) 32 (!) 27  Temp: 97.8 F (36.6 C)  (!) 100.4 F (38 C) 98.1 F (36.7 C)  TempSrc: Axillary  Axillary Axillary  SpO2: 94%  91% 95%  Weight:  Height:        Intake/Output Summary (Last 24 hours) at 07/04/2022 1148 Last data filed at 07/04/2022 1102 Gross per 24 hour  Intake 120 ml  Output 1250 ml  Net -1130 ml   Filed Weights   06/30/22 0504 07/01/22 0500 07/02/22 0300  Weight: 66.2 kg 66 kg 66.4 kg    Examination:  General exam: Extremely deconditioned, chronically ill looking, weak, confused HEENT: PERRL Respiratory system: Diminished sounds in bases, no wheezes or crackles  Cardiovascular system: Irregular irregular rhythm.  Gastrointestinal system: Abdomen is nondistended, soft and nontender. Central nervous system: Not alert or oriented Extremities: Bilateral upper extremity edema, no edema in  lower extremities, no clubbing ,no cyanosis Skin: No rashes, no ulcers,no icterus      Data Reviewed: I have personally reviewed following labs and imaging studies  CBC: Recent Labs  Lab 06/30/22 0720 07/02/22 0253 07/03/22 0333  WBC 12.2* 12.6* 10.9*  HGB 10.3* 9.9* 10.4*  HCT 33.3* 32.6* 34.9*  MCV 92.5 93.7 93.6  PLT PLATELET CLUMPS NOTED ON SMEAR, UNABLE TO ESTIMATE 173 269*   Basic Metabolic Panel: Recent Labs  Lab 06/28/22 0359 06/30/22 0720 07/01/22 0209 07/02/22 0253 07/03/22 0333  NA 140 142 141 141 144  K 4.2 5.2* 5.0 5.2* 4.8  CL 101 105 101 105 108  CO2 _0 GLUCOSE 111* 106* 130* 115* 114*  BUN 59* 77* 92* 107* 94*  CREATININE 0.90 0.98 1.22* 1.09* 0.93  CALCIUM 8.7* 8.6* 9.0 8.7* 8.9     Recent Results (from the past 240 hour(s))  Culture, blood (Routine X 2) w Reflex to ID Panel     Status: None   Collection Time: 06/26/22 10:07 PM   Specimen: BLOOD  Result Value Ref Range Status   Specimen Description BLOOD LEFT ANTECUBITAL  Final   Special Requests   Final    BOTTLES DRAWN AEROBIC AND ANAEROBIC Blood Culture adequate volume   Culture   Final    NO GROWTH 5 DAYS Performed at [facility], [address] [place], [place] [address]    Report Status 07/01/2022 FINAL  Final  Culture, blood (Routine X 2) w Reflex to ID Panel     Status: Abnormal   Collection Time: 06/26/22 10:16 PM   Specimen: BLOOD LEFT HAND  Result Value Ref Range Status   Specimen Description BLOOD LEFT HAND  Final   Special Requests   Final    BOTTLES DRAWN AEROBIC ONLY Blood Culture results may not be optimal due to an inadequate volume of blood received in culture bottles   Culture  Setup Time   Final    GRAM POSITIVE COCCI IN CLUSTERS AEROBIC BOTTLE ONLY CRITICAL RESULT CALLED TO, READ BACK BY AND VERIFIED WITH: PHARMD GREG ABBOTT 06/28/22_1 :6 BY TW    Culture (A)  Final    STAPHYLOCOCCUS EPIDERMIDIS THE SIGNIFICANCE OF ISOLATING THIS ORGANISM FROM A  SINGLE SET OF BLOOD CULTURES WHEN MULTIPLE SETS ARE DRAWN IS UNCERTAIN. PLEASE NOTIFY THE MICROBIOLOGY DEPARTMENT WITHIN ONE WEEK IF SPECIATION AND SENSITIVITIES ARE REQUIRED. Performed at [facility], [place] [address] [place], [place] [address]    Report Status 06/29/2022 FINAL  Final  Blood Culture ID Panel (Reflexed)     Status: Abnormal   Collection Time: 06/26/22 10:16 PM  Result Value Ref Range Status   Enterococcus faecalis NOT DETECTED NOT DETECTED Final   Enterococcus Faecium NOT DETECTED NOT DETECTED Final   Listeria monocytogenes NOT  DETECTED NOT DETECTED Final   Staphylococcus species DETECTED (A) NOT DETECTED Final    Comment: CRITICAL RESULT CALLED TO, READ BACK BY AND VERIFIED WITH: PHARMD GREG ABBOTT 06/28/22_0 :53 BY TW    Staphylococcus aureus (BCID) NOT DETECTED NOT DETECTED Final   Staphylococcus epidermidis DETECTED (A) NOT DETECTED Final    Comment: Methicillin (oxacillin) resistant coagulase negative staphylococcus. Possible blood culture contaminant (unless isolated from more than one blood culture draw or clinical case suggests pathogenicity). No antibiotic treatment is indicated for blood  culture contaminants. CRITICAL RESULT CALLED TO, READ BACK BY AND VERIFIED WITH: PHARMD GREG ABBOTT 06/28/22_1 :77 BY TW    Staphylococcus lugdunensis NOT DETECTED NOT DETECTED Final   Streptococcus species NOT DETECTED NOT DETECTED Final   Streptococcus agalactiae NOT DETECTED NOT DETECTED Final   Streptococcus pneumoniae NOT DETECTED NOT DETECTED Final   Streptococcus pyogenes NOT DETECTED NOT DETECTED Final   A.calcoaceticus-baumannii NOT DETECTED NOT DETECTED Final   Bacteroides fragilis NOT DETECTED NOT DETECTED Final   Enterobacterales NOT DETECTED NOT DETECTED Final   Enterobacter cloacae complex NOT DETECTED NOT DETECTED Final   Escherichia coli NOT DETECTED NOT DETECTED Final   Klebsiella aerogenes NOT DETECTED NOT DETECTED Final   Klebsiella oxytoca NOT DETECTED  NOT DETECTED Final   Klebsiella pneumoniae NOT DETECTED NOT DETECTED Final   Proteus species NOT DETECTED NOT DETECTED Final   Salmonella species NOT DETECTED NOT DETECTED Final   Serratia marcescens NOT DETECTED NOT DETECTED Final   Haemophilus influenzae NOT DETECTED NOT DETECTED Final   Neisseria meningitidis NOT DETECTED NOT DETECTED Final   Pseudomonas aeruginosa NOT DETECTED NOT DETECTED Final   Stenotrophomonas maltophilia NOT DETECTED NOT DETECTED Final   Candida albicans NOT DETECTED NOT DETECTED Final   Candida auris NOT DETECTED NOT DETECTED Final   Candida glabrata NOT DETECTED NOT DETECTED Final   Candida krusei NOT DETECTED NOT DETECTED Final   Candida parapsilosis NOT DETECTED NOT DETECTED Final   Candida tropicalis NOT DETECTED NOT DETECTED Final   Cryptococcus neoformans/gattii NOT DETECTED NOT DETECTED Final   Methicillin resistance mecA/C DETECTED (A) NOT DETECTED Final    Comment: CRITICAL RESULT CALLED TO, READ BACK BY AND VERIFIED WITH: PHARMD GREG ABBOTT 06/28/22_2 :53 BY TW Performed at [facility], [address] [place], [place] [address]      Radiology Studies: No results found.  Scheduled Meds:  allopurinol  100 mg Oral Daily   apixaban  5 mg Oral BID   Chlorhexidine Gluconate Cloth  6 each Topical Daily   diltiazem  30 mg Oral Q6H   donepezil  10 mg Oral QHS   feeding supplement  237 mL Oral TID BM   hydrocerin   Topical BID   leptospermum manuka honey  1 Application Topical Daily   memantine  5 mg Oral BID   metoprolol tartrate  50 mg Oral BID   multivitamin with minerals  1 tablet Oral Daily   mouth rinse  15 mL Mouth Rinse 4 times per day   pantoprazole  40 mg Oral Daily   sodium chloride flush  3 mL Intravenous Q12H   Continuous Infusions:     LOS: 18 days   Shelly Coss, MD [facility] P7/06/2022, 11:48 AM

## 2022-07-04 NOTE — Progress Notes (Signed)
Gave patient tylenol 650 mg PO for pain. She was moaning when her arms were touched and even the touch of a sheet was painful to her. Son [name] was there and I informed him I gave tylenol.  He then asked if she had anything else or stronger for pain. Nurse told him know and contacted provided.  At 1821 informed Dr. Tawanna Solo and requested morphine IV for one time dose.   MD entered orders for morphine IV 1 mg and oxycodone 5 mg immediate release as PRN orders.  Son was grateful and 1 mg morphine IV was administered to patient at [place].

## 2022-07-05 DIAGNOSIS — I480 Paroxysmal atrial fibrillation: Secondary | ICD-10-CM | POA: Diagnosis not present

## 2022-07-05 DIAGNOSIS — Z515 Encounter for palliative care: Secondary | ICD-10-CM | POA: Diagnosis not present

## 2022-07-05 LAB — BASIC METABOLIC PANEL
Anion gap: 11 (ref 5–15)
BUN: 88 mg/dL — ABNORMAL HIGH (ref 8–23)
CO2: 29 mmol/L (ref 22–32)
Calcium: 9.1 mg/dL (ref 8.9–10.3)
Chloride: 109 mmol/L (ref 98–111)
Creatinine, Ser: 0.92 mg/dL (ref 0.44–1.00)
GFR, Estimated: 60 mL/min — ABNORMAL LOW (ref >60)
Glucose, Bld: 135 mg/dL — ABNORMAL HIGH (ref 70–99)
Potassium: 5.1 mmol/L (ref 3.5–5.1)
Sodium: 149 mmol/L — ABNORMAL HIGH (ref 135–145)

## 2022-07-05 LAB — CBC
HCT: 36.1 % (ref 36.0–46.0)
Hemoglobin: 10.8 g/dL — ABNORMAL LOW (ref 12.0–15.0)
MCH: 28.1 pg (ref 26.0–34.0)
MCHC: 29.9 g/dL — ABNORMAL LOW (ref 30.0–36.0)
MCV: 93.8 fL (ref 80.0–100.0)
Platelets: 181 10*3/uL (ref 150–400)
RBC: 3.85 MIL/uL — ABNORMAL LOW (ref 3.87–5.11)
RDW: 20.6 % — ABNORMAL HIGH (ref 11.5–15.5)
WBC: 10.7 10*3/uL — ABNORMAL HIGH (ref 4.0–10.5)
nRBC: 0 % (ref 0.0–0.2)

## 2022-07-05 LAB — BRAIN NATRIURETIC PEPTIDE: B Natriuretic Peptide: 1271.9 pg/mL — ABNORMAL HIGH (ref 0.0–100.0)

## 2022-07-05 MED ORDER — DILTIAZEM HCL ER COATED BEADS 120 MG PO CP24
120.0000 mg | ORAL_CAPSULE | Freq: Every day | ORAL | Status: DC
  Administered 2022-07-05 – 2022-07-06 (×2): 120 mg via ORAL
  Filled 2022-07-05 (×2): qty 1

## 2022-07-05 NOTE — Progress Notes (Signed)
Progress Note  Patient Name: ARBOR LEER Date of Encounter: 07/05/2022  [facility] Cardiologist: [name], MD   Subjective   Nonverbal with me  Inpatient Medications    Scheduled Meds:  allopurinol  100 mg Oral Daily   apixaban  5 mg Oral BID   Chlorhexidine Gluconate Cloth  6 each Topical Daily   donepezil  10 mg Oral QHS   feeding supplement  237 mL Oral TID BM   hydrocerin   Topical BID   leptospermum manuka honey  1 Application Topical Daily   memantine  5 mg Oral BID   multivitamin with minerals  1 tablet Oral Daily   mouth rinse  15 mL Mouth Rinse 4 times per day   pantoprazole  40 mg Oral Daily   sodium chloride flush  3 mL Intravenous Q12H   Continuous Infusions:  diltiazem (CARDIZEM) infusion 5 mg/hr (07/04/22 2325)   PRN Meds: acetaminophen **OR** acetaminophen, morphine injection, Muscle Rub, ondansetron **OR** ondansetron (ZOFRAN) IV, mouth rinse, oxyCODONE   Vital Signs    Vitals:   07/05/22 0000 07/05/22 0100 07/05/22 0400 07/05/22 0450  BP: 104/67 101/78 102/71   Pulse: (!) 103 (!) 104 (!) 104   Resp: (!) 29 (!) 27 (!) 21   Temp:   98.2 F (36.8 C)   TempSrc:   Oral   SpO2: 95% 95% 95%   Weight:    71.2 kg  Height:        Intake/Output Summary (Last 24 hours) at 07/05/2022 1025 Last data filed at 07/05/2022 0926 Gross per 24 hour  Intake 120 ml  Output 800 ml  Net -680 ml      07/05/2022    4:50 AM 07/02/2022    3:00 AM 07/01/2022    5:00 AM  Last 3 Weights  Weight (lbs) 157 lb 146 lb 6.2 oz 145 lb 7.7 oz  Weight (kg) 71.215 kg 66.4 kg 65.99 kg      Telemetry    Atrial fibrillation heart rates improved into the 90s to 100s- Personally Reviewed  ECG    A-fib with RVR- Personally Reviewed  Physical Exam   In bed, eyes open.  Did not interact with me.  Heart rate irregularly irregular normal rate.  Labs    High Sensitivity Troponin:  No results for input(s): "TROPONINIHS" in the last 720 hours.   Chemistry Recent Labs   Lab 07/02/22 0253 07/03/22 0333 07/05/22 0205  NA 141 144 149*  K 5.2* 4.8 5.1  CL 105 108 109  CO2 _0 GLUCOSE 115* 114* 135*  BUN 107* 94* 88*  CREATININE 1.09* 0.93 0.92  CALCIUM 8.7* 8.9 9.1  GFRNONAA 49* 59* 60*  ANIONGAP _1 Lipids No results for input(s): "CHOL", "TRIG", "HDL", "LABVLDL", "LDLCALC", "CHOLHDL" in the last 168 hours.  Hematology Recent Labs  Lab 07/02/22 0253 07/03/22 0333 07/05/22 0205  WBC 12.6* 10.9* 10.7*  RBC 3.48* 3.73* 3.85*  HGB 9.9* 10.4* 10.8*  HCT 32.6* 34.9* 36.1  MCV 93.7 93.6 93.8  MCH 28.4 27.9 28.1  MCHC 30.4 29.8* 29.9*  RDW 20.9* 20.9* 20.6*  PLT 173 140* 181   Thyroid No results for input(s): "TSH", "FREET4" in the last 168 hours.  BNP Recent Labs  Lab 07/05/22 0205  BNP 1,271.9*    DDimer No results for input(s): "DDIMER" in the last 168 hours.    Patient Profile     88 y.o. female with atrial fibrillation  RVR with underlying advanced dementia  Assessment & Plan    Atrial fibrillation RVR - Consult note from last night reviewed, heart rates in the 120s to 130s is acceptable given underlying condition.  Currently heart rates are improved down to 95-101. -Currently on IV diltiazem 5 mg/h. -Can move to p.o. diltiazem 120 mg once a day. -Can continue apixaban 5 mg twice daily  Palliative care note reviewed-continue current level of care.  DNR/DNI.  We will go ahead and sign off.  Please let us know if we can be of further assistance.    For questions or updates, please contact [facility] Please consult [email or web address] for contact info under        Signed, Candee Furbish, MD  07/05/2022, 10:25 AM

## 2022-07-05 NOTE — Progress Notes (Signed)
This RN was called into patient's room by patient's son regarding pt's HR fluctuating into 150-160s at times. Son verbalized concern that pt's HR sustaining in 120-130s and no one is doing anything about it. This RN attempted to administer scheduled metoprolol, crushed and mixed with Ensure; however, pt would not open mouth with a straw and without straw. Son encouraged pt to take medication, but pt still would not open mouth. Pt seemed to take two tiny sips, but did not seem to swallow; unable to check due to pt not opening mouth. Son insinuated that pt should be forced to take by mouth; however, this RN explained to pt's son that I cannot force pt to take medications. On call provider notified of pt's son's concerns. EKG and Cardizem gtt were ordered and started. Will continue to monitor.

## 2022-07-05 NOTE — Progress Notes (Signed)
Palliative Medicine Inpatient Follow Up Note   HPI: 88 y.o. female  with past medical history of permanent A-fib, dementia, chronic congestive heart failure, CKD stage IIIb, and recent hospitalization for ESBL UTI  admitted on 06/11/2022 with poor PO intake. Chest x-ray showed cardiomegaly, moderate right-sided pleural effusion. Patient continues with FTT and poor PO intake. Patient has been declined for rehab as she is unable to participate. PMT consulted to [facility].   Today's Discussion 07/05/2022  *Please note that this is a verbal dictation therefore any spelling or grammatical errors are due to the "[facility]" system interpretation.  Chart reviewed inclusive of vital signs, progress notes, laboratory results, and diagnostic images.   Aniko has received one dose of intravenous morphine in the last 24 hours.  I met with Javiana at bedside this morning.  Her breathing was even and nonlabored.  She was nonverbal but did groan whenever I would touch any part of her body.  She remains to be in atrial fibrillation although is on a diltiazem drip.  There is no family at bedside this morning.  I called patient's son [name] this morning and updated him.  His biggest concern is patient's nutritional intake which I shared is overall not good -this is very reflective of the long-term reality that Rindy is encroaching her end-of-life process.  Created space and opportunity for [name] to explore thoughts feelings and fears regarding current medical situation.  [name] was fairly quiet and reflective during our discussion.  I offered support and shared that I would be here throughout the weekend and I would continue seeing Janisse and trying to help guide he and his brother is able throughout her disease process.  Objective Assessment: Vital Signs Vitals:   07/05/22 0100 07/05/22 0400  BP: 101/78 102/71  Pulse: (!) 104 (!) 104  Resp: (!) 27 (!) 21  Temp:  98.2 F (36.8 C)  SpO2: 95% 95%     Intake/Output Summary (Last 24 hours) at 07/05/2022 0849 Last data filed at 07/05/2022 0400 Gross per 24 hour  Intake 0 ml  Output 950 ml  Net -950 ml   Last Weight  Most recent update: 07/05/2022  4:50 AM    Weight  71.2 kg (157 lb)            Gen: Frail elderly Caucasian female in moderate distress HEENT: Dry mucous membranes CV: Irregular rate and rhythm PULM: 4 L nasal cannula breathing is even and nonlabored ABD: soft/nontender EXT: Appears to be anasarcic in all extremities Neuro: Disoriented unable to elicit a reasonable response  SUMMARY OF RECOMMENDATIONS   DNAR/DNI  Continue current scope of care  Bianna's p.o. intake remains to be very poor and overall she is grimacing and moaning long-term prognosis is exceptionally limited which family is aware of  Palliative care will continue to support and guide Jacquel sons throughout this difficult process.  There is a bit of a discrepancy between sons as one seems more realistic than the other regarding what Danylah's future will entail  Billing based on MDM: High  Problems Addressed: One acute or chronic illness or injury that poses a threat to life or bodily function  Amount and/or Complexity of Data: Category 3:Discussion of management or test interpretation with external physician/other qualified health care professional/appropriate source (not separately reported)  Risks: Parenteral controlled substances ______________________________________________________________________________________ [place] Team Team Cell Phone: [phone] Please utilize secure chat with additional questions, if there is no response within 30  minutes please call the above phone number  Palliative Medicine Team providers are available by phone from 7am to 7pm daily and can be reached through the team cell phone.  Should this patient require assistance outside of these hours, please call the patient's  attending physician.

## 2022-07-05 NOTE — Progress Notes (Addendum)
PROGRESS NOTE  [name] Dyer  HAF:[id number] DOB: 1932/12/08 DOA: 06/01/2022 PCP: [name], NP   Brief Narrative:  Patient is a 88 year old female with history of permanent A-fib, dementia, chronic congestive heart failure, CKD stage IIIb, recent hospitalization for ESBL UTI and was discharged to skilled nursing facility for severe deconditioning presented back with very poor oral intake, increased oxygen requirement.  She was found to be volume overloaded on presentation, tachycardic.  Chest x-ray showed cardiomegaly, moderate right-sided pleural effusion.  Hospital course remarkable for prolonged stay with failure to thrive, poor oral intake.  Goals of care discussed and patient made DNR.  PT/OT recommended SNF for discharge but her insurance declined due to her possibility of custodial status, she has not participated with therapy that much.  Family appealing, waiting for long-term care.  We have recommended hospice approach, palliative care consulted.  Family divided about goals of care and did not want to pursue hospice.  Palliative care continues to follow.  Awaiting SNF placement.  Very poor prognosis, patient is  declining.    Assessment & Plan:  Principal Problem:   Acute encephalopathy Active Problems:   Acute renal failure superimposed on stage 3b chronic kidney disease (HCC)   Sepsis due to gram-negative UTI (HCC)   Acute on chronic diastolic CHF (congestive heart failure) (HCC)   Acute respiratory failure with hypoxia (HCC)   Alzheimer's disease ([place])   Hypertension   Permanent atrial fibrillation (HCC)   Acute urinary retention   Dysphagia   Orthostatic hypotension   Pressure injury of skin   Thrombocytopenia (HCC)   Acute metabolic encephalopathy/goals of care/history of dementia: Multifactorial secondary to delirium, dehydration, hypoxemia, hyponatremia, poor oral intake.  Most likely patient has progressive dementia that has worsened for last 6 months.  Not  much interactive, has very poor oral intake.  We have recommended hospice placement.  Palliative care consulted.  Palliative care discussed with family, one of the sons not agreeable  hospice approach . maintain delirium precautions.  Continue dementia medications.  AKI on CKD stage IIIb/hypernatremia/electrolyte abnormalities: Very poor oral intake.  Currently appears euvolemic.  Hyperkalemia resolved with Lokelma.  I will simply watch on sodium, will not restart IV fluids.  Suspected sepsis: Blood cultures have shown Staph epidermidis, staph hominis.  Likely contamination.  Urine culture showed 10,000 colonies of Enterococcus faecalis.  Antibiotics have been discontinued now.  Has mild leukocytosis but afebrile  Acute on chronic diastolic congestive heart failure: Echo in January 2023 showed preserved ejection fraction with mild reduction in the right ventricular systolic function.  Currently appears almost euvolemic.  Lungs are clear on auscultation, no lower extremity edema.  Has some edema on bilateral upper extremities.  Elevated BNP  Acute respiratory failure with hypoxia: Chest x-ray showed patchy bilateral airspace disease, right greater than left,improving on the right since prior study.,small right pleural effusion.  on 3 L/min of oxygen  Permanent A-fib: Rate fluctuating.  Remains in A-fib.  She has required IV Cardizem for rate control during this hospitalization.  Xarelto switched to Eliquis for anticoagulation.  Cardiology recommended to change to oral Cardizem.  Urine retention: Foley was placed during this hospitalization  Normocytic anemia: Currently hemoglobin stable in the range of 9-10.  Dysphagia: Continue aspiration precaution .speech therapist was following.  Currently on dysphagia 1 diet.  Poor oral intake.  Nutritionist following  Disposition/goals of care: PT/OT recommended skilled nursing facility but her insurance declined.  Family appealing.  We recommended hospice  approach but family  divided on that.  Palliative care assisting.  Patient is rapidly declining  .press  . Pressure Injury 06/17/22 Coccyx Mid;Lower Unstageable - Full thickness tissue loss in which the base of the injury is covered by slough (yellow, tan, gray, green or brown) and/or eschar (tan, brown or black) in the wound bed. (Active)  06/17/22 1500  Location: Coccyx  Location Orientation: Mid;Lower  Staging: Unstageable - Full thickness tissue loss in which the base of the injury is covered by slough (yellow, tan, gray, green or brown) and/or eschar (tan, brown or black) in the wound bed.  Wound Description (Comments):   Present on Admission: Yes        Nutrition Problem: Inadequate oral intake Etiology: dysphagia, lethargy/confusion Pressure Injury 06/17/22 Coccyx Mid;Lower Unstageable - Full thickness tissue loss in which the base of the injury is covered by slough (yellow, tan, gray, green or brown) and/or eschar (tan, brown or black) in the wound bed. (Active)  06/17/22 1500  Location: Coccyx  Location Orientation: Mid;Lower  Staging: Unstageable - Full thickness tissue loss in which the base of the injury is covered by slough (yellow, tan, gray, green or brown) and/or eschar (tan, brown or black) in the wound bed.  Wound Description (Comments):   Present on Admission: Yes  Dressing Type Foam - Lift dressing to assess site every shift 07/05/22 0915    DVT prophylaxis:Place TED hose Start: 06/21/22 1218 apixaban (ELIQUIS) tablet 5 mg     Code Status: DNR  Family Communication: called and discussed with son [name] on phone on 7/8  Patient status:Inpatient  Patient is from SNF  Anticipated discharge to:SNF vs Hospice  Estimated DC date: Not sure   Consultants: Palliative care  Procedures: None  Antimicrobials:  Anti-infectives (From admission, onward)    Start     Dose/Rate Route Frequency Ordered Stop   06/03/2022 2030  meropenem (MERREM) 1 g in sodium  chloride 0.9 % 100 mL IVPB  Status:  Discontinued        1 g 200 mL/hr over 30 Minutes Intravenous Every 12 hours 06/05/2022 2016 06/18/22 1927   06/18/2022 1815  piperacillin-tazobactam (ZOSYN) IVPB 3.375 g        3.375 g 100 mL/hr over 30 Minutes Intravenous  Once 06/09/2022 1813 06/21/2022 1938   06/03/2022 1600  ceFEPIme (MAXIPIME) 2 g in sodium chloride 0.9 % 100 mL IVPB  Status:  Discontinued        2 g 200 mL/hr over 30 Minutes Intravenous  Once 06/12/2022 1552 06/24/2022 1609   06/09/2022 1600  metroNIDAZOLE (FLAGYL) IVPB 500 mg  Status:  Discontinued        500 mg 100 mL/hr over 60 Minutes Intravenous  Once 05/29/2022 1552 06/24/2022 1609   06/02/2022 1600  vancomycin (VANCOCIN) IVPB 1000 mg/200 mL premix  Status:  Discontinued        1,000 mg 200 mL/hr over 60 Minutes Intravenous  Once 06/15/2022 1552 06/15/2022 1609       Subjective: Patient seen and examined at bedside this morning.  Hemodynamically stable during evaluation.  She is little more alert today and tried to speak, she spoke  few words but disoriented.  Looks very deconditioned, weak.  Objective: Vitals:   07/05/22 0100 07/05/22 0400 07/05/22 0450 07/05/22 1057  BP: 101/78 102/71  99/68  Pulse: (!) 104 (!) 104  97  Resp: (!) 27 (!) 21  (!) 22  Temp:  98.2 F (36.8 C)  97.6 F (36.4 C)  TempSrc:  Oral  Axillary  SpO2: 95% 95%  94%  Weight:   71.2 kg   Height:        Intake/Output Summary (Last 24 hours) at 07/05/2022 1200 Last data filed at 07/05/2022 7371 Gross per 24 hour  Intake 240 ml  Output 800 ml  Net -560 ml   Filed Weights   07/01/22 0500 07/02/22 0300 07/05/22 0450  Weight: 66 kg 66.4 kg 71.2 kg    Examination:  General exam: Chronically ill looking, weak, extremity condition, confused HEENT: PERRL Respiratory system: Diminished air sounds bilaterally, no wheezes or crackles  Cardiovascular system: Irregular irregular rhythm Gastrointestinal system: Abdomen is nondistended, soft and nontender. Central nervous  system: Not alert and oriented Extremities: No lower extremity edema, no clubbing ,no cyanosis Skin: No rashes, pressure ulcer as above,no icterus     Data Reviewed: I have personally reviewed following labs and imaging studies  CBC: Recent Labs  Lab 06/30/22 0720 07/02/22 0253 07/03/22 0333 07/05/22 0205  WBC 12.2* 12.6* 10.9* 10.7*  HGB 10.3* 9.9* 10.4* 10.8*  HCT 33.3* 32.6* 34.9* 36.1  MCV 92.5 93.7 93.6 93.8  PLT PLATELET CLUMPS NOTED ON SMEAR, UNABLE TO ESTIMATE 173 140* 062   Basic Metabolic Panel: Recent Labs  Lab 06/30/22 0720 07/01/22 0209 07/02/22 0253 07/03/22 0333 07/05/22 0205  NA 142 141 141 144 149*  K 5.2* 5.0 5.2* 4.8 5.1  CL 105 101 105 108 109  CO2 _0 GLUCOSE 106* 130* 115* 114* 135*  BUN 77* 92* 107* 94* 88*  CREATININE 0.98 1.22* 1.09* 0.93 0.92  CALCIUM 8.6* 9.0 8.7* 8.9 9.1     Recent Results (from the past 240 hour(s))  Culture, blood (Routine X 2) w Reflex to ID Panel     Status: None   Collection Time: 06/26/22 10:07 PM   Specimen: BLOOD  Result Value Ref Range Status   Specimen Description BLOOD LEFT ANTECUBITAL  Final   Special Requests   Final    BOTTLES DRAWN AEROBIC AND ANAEROBIC Blood Culture adequate volume   Culture   Final    NO GROWTH 5 DAYS Performed at [facility], [address], [place], [place] [address]    Report Status 07/01/2022 FINAL  Final  Culture, blood (Routine X 2) w Reflex to ID Panel     Status: Abnormal   Collection Time: 06/26/22 10:16 PM   Specimen: BLOOD LEFT HAND  Result Value Ref Range Status   Specimen Description BLOOD LEFT HAND  Final   Special Requests   Final    BOTTLES DRAWN AEROBIC ONLY Blood Culture results may not be optimal due to an inadequate volume of blood received in culture bottles   Culture  Setup Time   Final    GRAM POSITIVE COCCI IN CLUSTERS AEROBIC BOTTLE ONLY CRITICAL RESULT CALLED TO, READ BACK BY AND VERIFIED WITH: PHARMD GREG ABBOTT 06/28/22_1 :44 BY TW     Culture (A)  Final    STAPHYLOCOCCUS EPIDERMIDIS THE SIGNIFICANCE OF ISOLATING THIS ORGANISM FROM A SINGLE SET OF BLOOD CULTURES WHEN MULTIPLE SETS ARE DRAWN IS UNCERTAIN. PLEASE NOTIFY THE MICROBIOLOGY DEPARTMENT WITHIN ONE WEEK IF SPECIATION AND SENSITIVITIES ARE REQUIRED. Performed at [facility], [place] [address] [place], [place] [address]    Report Status 06/29/2022 FINAL  Final  Blood Culture ID Panel (Reflexed)     Status: Abnormal   Collection Time: 06/26/22 10:16 PM  Result Value Ref Range Status   Enterococcus faecalis NOT DETECTED  NOT DETECTED Final   Enterococcus Faecium NOT DETECTED NOT DETECTED Final   Listeria monocytogenes NOT DETECTED NOT DETECTED Final   Staphylococcus species DETECTED (A) NOT DETECTED Final    Comment: CRITICAL RESULT CALLED TO, READ BACK BY AND VERIFIED WITH: PHARMD GREG ABBOTT 06/28/22_0 :53 BY TW    Staphylococcus aureus (BCID) NOT DETECTED NOT DETECTED Final   Staphylococcus epidermidis DETECTED (A) NOT DETECTED Final    Comment: Methicillin (oxacillin) resistant coagulase negative staphylococcus. Possible blood culture contaminant (unless isolated from more than one blood culture draw or clinical case suggests pathogenicity). No antibiotic treatment is indicated for blood  culture contaminants. CRITICAL RESULT CALLED TO, READ BACK BY AND VERIFIED WITH: PHARMD GREG ABBOTT 06/28/22_1 :26 BY TW    Staphylococcus lugdunensis NOT DETECTED NOT DETECTED Final   Streptococcus species NOT DETECTED NOT DETECTED Final   Streptococcus agalactiae NOT DETECTED NOT DETECTED Final   Streptococcus pneumoniae NOT DETECTED NOT DETECTED Final   Streptococcus pyogenes NOT DETECTED NOT DETECTED Final   A.calcoaceticus-baumannii NOT DETECTED NOT DETECTED Final   Bacteroides fragilis NOT DETECTED NOT DETECTED Final   Enterobacterales NOT DETECTED NOT DETECTED Final   Enterobacter cloacae complex NOT DETECTED NOT DETECTED Final   Escherichia coli NOT DETECTED NOT  DETECTED Final   Klebsiella aerogenes NOT DETECTED NOT DETECTED Final   Klebsiella oxytoca NOT DETECTED NOT DETECTED Final   Klebsiella pneumoniae NOT DETECTED NOT DETECTED Final   Proteus species NOT DETECTED NOT DETECTED Final   Salmonella species NOT DETECTED NOT DETECTED Final   Serratia marcescens NOT DETECTED NOT DETECTED Final   Haemophilus influenzae NOT DETECTED NOT DETECTED Final   Neisseria meningitidis NOT DETECTED NOT DETECTED Final   Pseudomonas aeruginosa NOT DETECTED NOT DETECTED Final   Stenotrophomonas maltophilia NOT DETECTED NOT DETECTED Final   Candida albicans NOT DETECTED NOT DETECTED Final   Candida auris NOT DETECTED NOT DETECTED Final   Candida glabrata NOT DETECTED NOT DETECTED Final   Candida krusei NOT DETECTED NOT DETECTED Final   Candida parapsilosis NOT DETECTED NOT DETECTED Final   Candida tropicalis NOT DETECTED NOT DETECTED Final   Cryptococcus neoformans/gattii NOT DETECTED NOT DETECTED Final   Methicillin resistance mecA/C DETECTED (A) NOT DETECTED Final    Comment: CRITICAL RESULT CALLED TO, READ BACK BY AND VERIFIED WITH: PHARMD GREG ABBOTT 06/28/22_2 :53 BY TW Performed at [facility], [address], [place], [place] [address]      Radiology Studies: No results found.  Scheduled Meds:  allopurinol  100 mg Oral Daily   apixaban  5 mg Oral BID   Chlorhexidine Gluconate Cloth  6 each Topical Daily   diltiazem  120 mg Oral Daily   donepezil  10 mg Oral QHS   feeding supplement  237 mL Oral TID BM   hydrocerin   Topical BID   leptospermum manuka honey  1 Application Topical Daily   memantine  5 mg Oral BID   multivitamin with minerals  1 tablet Oral Daily   mouth rinse  15 mL Mouth Rinse 4 times per day   pantoprazole  40 mg Oral Daily   sodium chloride flush  3 mL Intravenous Q12H   Continuous Infusions:     LOS: 19 days   Shelly Coss, MD [facility] P7/07/2022, 12:00 PM

## 2022-07-06 ENCOUNTER — Inpatient Hospital Stay (HOSPITAL_COMMUNITY): Payer: Medicare Other

## 2022-07-06 DIAGNOSIS — Z7189 Other specified counseling: Secondary | ICD-10-CM | POA: Diagnosis not present

## 2022-07-06 DIAGNOSIS — G9340 Encephalopathy, unspecified: Secondary | ICD-10-CM | POA: Diagnosis not present

## 2022-07-06 DIAGNOSIS — Z515 Encounter for palliative care: Secondary | ICD-10-CM | POA: Diagnosis not present

## 2022-07-06 LAB — BASIC METABOLIC PANEL
Anion gap: 10 (ref 5–15)
BUN: 83 mg/dL — ABNORMAL HIGH (ref 8–23)
CO2: 27 mmol/L (ref 22–32)
Calcium: 8.7 mg/dL — ABNORMAL LOW (ref 8.9–10.3)
Chloride: 110 mmol/L (ref 98–111)
Creatinine, Ser: 0.86 mg/dL (ref 0.44–1.00)
GFR, Estimated: >60 mL/min (ref >60)
Glucose, Bld: 127 mg/dL — ABNORMAL HIGH (ref 70–99)
Potassium: 4.8 mmol/L (ref 3.5–5.1)
Sodium: 147 mmol/L — ABNORMAL HIGH (ref 135–145)

## 2022-07-06 MED ORDER — GLYCOPYRROLATE 0.2 MG/ML IJ SOLN: 0.2000 mg | INTRAMUSCULAR | Status: DC | PRN

## 2022-07-06 MED ORDER — ACETAMINOPHEN 650 MG RE SUPP: 650.0000 mg | Freq: Four times a day (QID) | RECTAL | Status: DC | PRN

## 2022-07-06 MED ORDER — GLYCOPYRROLATE 1 MG PO TABS: 1.0000 mg | ORAL_TABLET | ORAL | Status: DC | PRN

## 2022-07-06 MED ORDER — BIOTENE DRY MOUTH MT LIQD: 15.0000 mL | OROMUCOSAL | Status: DC | PRN

## 2022-07-06 MED ORDER — ACETAMINOPHEN 325 MG PO TABS: 650.0000 mg | ORAL_TABLET | Freq: Four times a day (QID) | ORAL | Status: DC | PRN

## 2022-07-06 MED ORDER — MORPHINE SULFATE (PF) 2 MG/ML IV SOLN
2.0000 mg | INTRAVENOUS | Status: DC | PRN
  Administered 2022-07-06 – 2022-07-07 (×6): 2 mg via INTRAVENOUS
  Filled 2022-07-06 (×6): qty 1

## 2022-07-06 MED ORDER — MORPHINE SULFATE (PF) 2 MG/ML IV SOLN
2.0000 mg | INTRAVENOUS | Status: DC
  Administered 2022-07-06 – 2022-07-07 (×6): 2 mg via INTRAVENOUS
  Filled 2022-07-06 (×6): qty 1

## 2022-07-06 MED ORDER — POLYVINYL ALCOHOL 1.4 % OP SOLN: 1.0000 [drp] | Freq: Four times a day (QID) | OPHTHALMIC | Status: DC | PRN

## 2022-07-06 MED ORDER — LORAZEPAM 2 MG/ML IJ SOLN
0.5000 mg | INTRAMUSCULAR | Status: DC | PRN
  Administered 2022-07-07: 1 mg via INTRAVENOUS
  Filled 2022-07-06: qty 1

## 2022-07-06 NOTE — Progress Notes (Signed)
Palliative Medicine Inpatient Follow Up Note   HPI: 88 y.o. female  with past medical history of permanent A-fib, dementia, chronic congestive heart failure, CKD stage IIIb, and recent hospitalization for ESBL UTI  admitted on 06/11/2022 with poor PO intake. Chest x-ray showed cardiomegaly, moderate right-sided pleural effusion. Patient continues with FTT and poor PO intake. Patient has been declined for rehab as she is unable to participate. PMT consulted to [place].   Today's Discussion 07/06/2022  *Please note that this is a verbal dictation therefore any spelling or grammatical errors are due to the "[employer]" system interpretation.  Chart reviewed inclusive of vital signs, progress notes, laboratory results, and diagnostic images.   Leala received two doses of IV morphine in the past 24 hours.  I met with Nedra at bedside she was noted to be quite tachycardic and tachypneic. Per nursing she has looked very poorly.  I was able to secure chat with Dr. Broadus John who called patients family and recommended patients son [name] come in and patient transition to comfort measures.   I met at bedside this afternoon with both [name] and [name]. We reviewed Lakechia's clinical decline. I shared that she appears to be at a point where she has endured undue suffering. We discussed that truly the only mode of action at this time is that of keeping Cheridan comfortable. We talked about transition to comfort measures in house and what that would entail inclusive of medications to control pain, dyspnea, agitation, nausea, itching, and hiccups.  We discussed stopping all uneccessary measures such as cardiac monitoring, blood draws, needle sticks, and frequent vital signs. Utilized reflective listening throughout our time together as [name] was clearly very upset by this news.   [name] asked if there could be anything more done and I shared that at this point it appears options have been exhausted and  we need to provide peace and comfort to [place], allowing her to pass naturally when it is her time.   [name] asked if the medications used will hasten Elzie's life, I shared we use the lowest effective dose. I did express that in some cases, patients need higher doses of medication and drips to maintain symptom control.   Emotional support provided through therapeutic listening.   Objective Assessment: Vital Signs Vitals:   07/06/22 0400 07/06/22 0623  BP: 102/87   Pulse: (!) 133   Resp: (!) 29   Temp: 100 F (37.8 C) 100 F (37.8 C)  SpO2: 93%     Intake/Output Summary (Last 24 hours) at 07/06/2022 1130 Last data filed at 07/06/2022 5643 Gross per 24 hour  Intake 240 ml  Output 1125 ml  Net -885 ml    Last Weight  Most recent update: 07/06/2022  4:11 AM    Weight  66.2 kg (146 lb)            Gen: Frail elderly Caucasian female in moderate distress HEENT: Dry mucous membranes CV: Irregular rate and rhythm PULM: 4 L nasal cannula breathing is even and nonlabored ABD: soft/nontender EXT: Appears to be anasarcic in all extremities Neuro: Somnolent  SUMMARY OF RECOMMENDATIONS   DNAR/DNI  Comfort Care  Medication per MAR --> Morphine 50m IVP Q4H and PRN  Unrestricted visitation  Spiritual support  Patient will be an in hospital death within hours to days  Ongoing Palliative symptom management  Billing based on MDM: High  Problems Addressed: One acute or chronic illness or injury that poses a threat to life  or bodily function  Amount and/or Complexity of Data: Category 3:Discussion of management or test interpretation with external physician/other qualified health care professional/appropriate source (not separately reported)  Risks: Parenteral controlled substances ______________________________________________________________________________________ [place] Team Team Cell Phone: [phone] Please utilize secure  chat with additional questions, if there is no response within 30 minutes please call the above phone number  Palliative Medicine Team providers are available by phone from 7am to 7pm daily and can be reached through the team cell phone.  Should this patient require assistance outside of these hours, please call the patient's attending physician.

## 2022-07-06 NOTE — Progress Notes (Signed)
PROGRESS NOTE  [name] Dyer  WSF:[id number] DOB: 1932-12-08 DOA: 06/10/2022 PCP: [name], NP   Brief Narrative:  Patient is a 88 year old female with history of permanent A-fib, dementia, chronic congestive heart failure, CKD stage IIIb, recent hospitalization for ESBL UTI and was discharged to skilled nursing facility for severe deconditioning presented back with very poor oral intake, increased oxygen requirement.  She was found to be volume overloaded on presentation, tachycardic.  Chest x-ray showed cardiomegaly, moderate right-sided pleural effusion.  Hospital course remarkable for prolonged stay with failure to thrive, poor oral intake.  Goals of care discussed and patient made DNR.  PT/OT recommended SNF for discharge but her insurance declined due to her possibility of custodial status, she has not participated with therapy that much.    We recommended hospice services, palliative care consulted.  Assessment & Plan:  Acute metabolic encephalopathy/goals of care/ Dementia  multifactorial secondary to delirium, dehydration, hypoxemia, hyponatremia, poor oral intake.  Most likely patient has progressive dementia that has worsened for last 6 months.  Poorly interactive, minimal oral intake -Followed by palliative care this admission -Myself as well as multiple prior attending MDs have recommended hospice and comfort focused care to patient's family  Permanent A-fib with RVR -Heart rate continues to fluctuate, significantly elevated overnight, she is also clinically volume overloaded however blood pressure remains soft -Was on Cardizem drip multiple times, changed to p.o. Cardizem by cardiology yesterday, remains on Eliquis  AKI on CKD stage IIIb/hypernatremia/electrolyte abnormalities:  -She is significantly volume overloaded with third spacing but intravascularly depleted, sodium mildly elevated, off IV fluids  Suspected sepsis: Blood cultures from different dates have grown  Staph epidermidis, staph hominis in 1 out of 2 sets.  Likely contamination.  Urine culture showed 10,000 colonies of Enterococcus faecalis.  Antibiotics discontinued by my partner few days ago, mild leukocytosis  Acute on chronic diastolic congestive heart failure: Hypoalbuminemia, third spacing - Echo in January 2023 showed preserved ejection fraction with mild reduction in the right ventricular systolic function.   -Vitals not stable for diuretics at this time  Permanent A-fib: Rate fluctuating.  Remains in A-fib.  She has required IV Cardizem for rate control during this hospitalization.  Xarelto switched to Eliquis for anticoagulation.  Cardiology recommended to change to oral Cardizem.  Unstageable large sacral decubitus wound -Continue wound care -Poor prognosis  Severe protein calorie malnutrition  Urine retention: Foley was placed during this hospitalization  Dysphagia: Continue aspiration precaution .speech therapist was following.  Currently on dysphagia 1 diet.  Poor oral intake.  Nutritionist following  Disposition/goals of care:  89/F with dementia, CHF, encephalopathy, ongoing cognitive decline, A-fib RVR, severe malnutrition, large unstageable sacral decubitus wound, hypotension with ongoing decline, vitals unstable this morning, many of my partners have recommended hospice and comfort care to patient's family this admission, I called and discussed with patient's son [name], reiterated poor prognosis recommended comfort focused care, palliative team was able to discuss with patient's family later this morning and she was transitioned to comfort care   . Pressure Injury 06/17/22 Coccyx Mid;Lower Unstageable - Full thickness tissue loss in which the base of the injury is covered by slough (yellow, tan, gray, green or brown) and/or eschar (tan, brown or black) in the wound bed. (Active)  06/17/22 1500  Location: Coccyx  Location Orientation: Mid;Lower  Staging: Unstageable -  Full thickness tissue loss in which the base of the injury is covered by slough (yellow, tan, gray, green or brown) and/or eschar (tan,  brown or black) in the wound bed.  Wound Description (Comments):   Present on Admission: Yes        Nutrition Problem: Inadequate oral intake Etiology: dysphagia, lethargy/confusion Pressure Injury 06/17/22 Coccyx Mid;Lower Unstageable - Full thickness tissue loss in which the base of the injury is covered by slough (yellow, tan, gray, green or brown) and/or eschar (tan, brown or black) in the wound bed. (Active)  06/17/22 1500  Location: Coccyx  Location Orientation: Mid;Lower  Staging: Unstageable - Full thickness tissue loss in which the base of the injury is covered by slough (yellow, tan, gray, green or brown) and/or eschar (tan, brown or black) in the wound bed.  Wound Description (Comments):   Present on Admission: Yes  Dressing Type Foam - Lift dressing to assess site every shift 07/05/22 0915    DVT prophylaxis:     Code Status: DNR  Family Communication: Called and discussed with patient's son [name]    Consultants: Palliative care  Procedures: None  Antimicrobials:  Anti-infectives (From admission, onward)    Start     Dose/Rate Route Frequency Ordered Stop   06/07/2022 2030  meropenem (MERREM) 1 g in sodium chloride 0.9 % 100 mL IVPB  Status:  Discontinued        1 g 200 mL/hr over 30 Minutes Intravenous Every 12 hours 06/01/2022 2016 06/18/22 1927   06/08/2022 1815  piperacillin-tazobactam (ZOSYN) IVPB 3.375 g        3.375 g 100 mL/hr over 30 Minutes Intravenous  Once 06/01/2022 1813 06/04/2022 1938   06/01/2022 1600  ceFEPIme (MAXIPIME) 2 g in sodium chloride 0.9 % 100 mL IVPB  Status:  Discontinued        2 g 200 mL/hr over 30 Minutes Intravenous  Once 06/11/2022 1552 06/25/2022 1609   06/06/2022 1600  metroNIDAZOLE (FLAGYL) IVPB 500 mg  Status:  Discontinued        500 mg 100 mL/hr over 60 Minutes Intravenous  Once 06/02/2022 1552  06/17/2022 1609   06/07/2022 1600  vancomycin (VANCOCIN) IVPB 1000 mg/200 mL premix  Status:  Discontinued        1,000 mg 200 mL/hr over 60 Minutes Intravenous  Once 06/18/2022 1552 06/03/2022 1609       Subjective: Heart rate in the 140-150 range overnight remains poorly responsive, mumbles occasionally blood pressure dropping  Objective: Vitals:   07/06/22 0145 07/06/22 0146 07/06/22 0400 07/06/22 0623  BP:   102/87   Pulse:  (!) 102 (!) 133   Resp: (!) 27  (!) 29   Temp:   100 F (37.8 C) 100 F (37.8 C)  TempSrc:   Oral Oral  SpO2: 95%  93%   Weight:   66.2 kg   Height:        Intake/Output Summary (Last 24 hours) at 07/06/2022 1337 Last data filed at 07/06/2022 7989 Gross per 24 hour  Intake 120 ml  Output 1125 ml  Net -1005 ml   Filed Weights   07/02/22 0300 07/05/22 0450 07/06/22 0400  Weight: 66.4 kg 71.2 kg 66.2 kg    Examination:  General exam: Elderly frail chronically ill female laying in bed somnolent, mumbles few words, significant cognitive deficits, poorly responsive HEENT: Positive JVD CVS: S1-S2, irregularly irregular rhythm Lungs: Decreased breath sounds both bases Abdomen: Soft, obese, nontender, bowel sounds present Extremities: 2+ edema with significant third spacing Skin : Unstageable sacral decubitus wound   Data Reviewed: I have personally reviewed following labs and imaging studies  CBC:  Recent Labs  Lab 06/30/22 0720 07/02/22 0253 07/03/22 0333 07/05/22 0205  WBC 12.2* 12.6* 10.9* 10.7*  HGB 10.3* 9.9* 10.4* 10.8*  HCT 33.3* 32.6* 34.9* 36.1  MCV 92.5 93.7 93.6 93.8  PLT PLATELET CLUMPS NOTED ON SMEAR, UNABLE TO ESTIMATE 173 140* 122   Basic Metabolic Panel: Recent Labs  Lab 07/01/22 0209 07/02/22 0253 07/03/22 0333 07/05/22 0205 07/06/22 0225  NA 141 141 144 149* 147*  K 5.0 5.2* 4.8 5.1 4.8  CL 101 105 108 109 110  CO2 _0 GLUCOSE 130* 115* 114* 135* 127*  BUN 92* 107* 94* 88* 83*  CREATININE 1.22* 1.09* 0.93  0.92 0.86  CALCIUM 9.0 8.7* 8.9 9.1 8.7*     Recent Results (from the past 240 hour(s))  Culture, blood (Routine X 2) w Reflex to ID Panel     Status: None   Collection Time: 06/26/22 10:07 PM   Specimen: BLOOD  Result Value Ref Range Status   Specimen Description BLOOD LEFT ANTECUBITAL  Final   Special Requests   Final    BOTTLES DRAWN AEROBIC AND ANAEROBIC Blood Culture adequate volume   Culture   Final    NO GROWTH 5 DAYS Performed at [facility], [address], [place], [place] [address]    Report Status 07/01/2022 FINAL  Final  Culture, blood (Routine X 2) w Reflex to ID Panel     Status: Abnormal   Collection Time: 06/26/22 10:16 PM   Specimen: BLOOD LEFT HAND  Result Value Ref Range Status   Specimen Description BLOOD LEFT HAND  Final   Special Requests   Final    BOTTLES DRAWN AEROBIC ONLY Blood Culture results may not be optimal due to an inadequate volume of blood received in culture bottles   Culture  Setup Time   Final    GRAM POSITIVE COCCI IN CLUSTERS AEROBIC BOTTLE ONLY CRITICAL RESULT CALLED TO, READ BACK BY AND VERIFIED WITH: PHARMD GREG ABBOTT 06/28/22_1 :48 BY TW    Culture (A)  Final    STAPHYLOCOCCUS EPIDERMIDIS THE SIGNIFICANCE OF ISOLATING THIS ORGANISM FROM A SINGLE SET OF BLOOD CULTURES WHEN MULTIPLE SETS ARE DRAWN IS UNCERTAIN. PLEASE NOTIFY THE MICROBIOLOGY DEPARTMENT WITHIN ONE WEEK IF SPECIATION AND SENSITIVITIES ARE REQUIRED. Performed at [facility], [place] [address] [place], [place] [address]    Report Status 06/29/2022 FINAL  Final  Blood Culture ID Panel (Reflexed)     Status: Abnormal   Collection Time: 06/26/22 10:16 PM  Result Value Ref Range Status   Enterococcus faecalis NOT DETECTED NOT DETECTED Final   Enterococcus Faecium NOT DETECTED NOT DETECTED Final   Listeria monocytogenes NOT DETECTED NOT DETECTED Final   Staphylococcus species DETECTED (A) NOT DETECTED Final    Comment: CRITICAL RESULT CALLED TO, READ BACK BY AND  VERIFIED WITH: PHARMD GREG ABBOTT 06/28/22_2 :53 BY TW    Staphylococcus aureus (BCID) NOT DETECTED NOT DETECTED Final   Staphylococcus epidermidis DETECTED (A) NOT DETECTED Final    Comment: Methicillin (oxacillin) resistant coagulase negative staphylococcus. Possible blood culture contaminant (unless isolated from more than one blood culture draw or clinical case suggests pathogenicity). No antibiotic treatment is indicated for blood  culture contaminants. CRITICAL RESULT CALLED TO, READ BACK BY AND VERIFIED WITH: PHARMD GREG ABBOTT 06/28/22_3 :53 BY TW    Staphylococcus lugdunensis NOT DETECTED NOT DETECTED Final   Streptococcus species NOT DETECTED NOT DETECTED Final   Streptococcus agalactiae NOT DETECTED NOT DETECTED Final  Streptococcus pneumoniae NOT DETECTED NOT DETECTED Final   Streptococcus pyogenes NOT DETECTED NOT DETECTED Final   A.calcoaceticus-baumannii NOT DETECTED NOT DETECTED Final   Bacteroides fragilis NOT DETECTED NOT DETECTED Final   Enterobacterales NOT DETECTED NOT DETECTED Final   Enterobacter cloacae complex NOT DETECTED NOT DETECTED Final   Escherichia coli NOT DETECTED NOT DETECTED Final   Klebsiella aerogenes NOT DETECTED NOT DETECTED Final   Klebsiella oxytoca NOT DETECTED NOT DETECTED Final   Klebsiella pneumoniae NOT DETECTED NOT DETECTED Final   Proteus species NOT DETECTED NOT DETECTED Final   Salmonella species NOT DETECTED NOT DETECTED Final   Serratia marcescens NOT DETECTED NOT DETECTED Final   Haemophilus influenzae NOT DETECTED NOT DETECTED Final   Neisseria meningitidis NOT DETECTED NOT DETECTED Final   Pseudomonas aeruginosa NOT DETECTED NOT DETECTED Final   Stenotrophomonas maltophilia NOT DETECTED NOT DETECTED Final   Candida albicans NOT DETECTED NOT DETECTED Final   Candida auris NOT DETECTED NOT DETECTED Final   Candida glabrata NOT DETECTED NOT DETECTED Final   Candida krusei NOT DETECTED NOT DETECTED Final   Candida parapsilosis NOT  DETECTED NOT DETECTED Final   Candida tropicalis NOT DETECTED NOT DETECTED Final   Cryptococcus neoformans/gattii NOT DETECTED NOT DETECTED Final   Methicillin resistance mecA/C DETECTED (A) NOT DETECTED Final    Comment: CRITICAL RESULT CALLED TO, READ BACK BY AND VERIFIED WITH: PHARMD GREG ABBOTT 06/28/22_0 :53 BY TW Performed at [facility], [address] [place], [place] [address]      Radiology Studies: DG CHEST PORT 1 VIEW  Result Date: 07/06/2022 CLINICAL DATA:  Hypoxia. EXAM: PORTABLE CHEST 1 VIEW COMPARISON:  06/26/2022 FINDINGS: Cardiac enlargement. There is a large right pleural effusion, which has increased in volume from previous exam. There is associated decreased aeration to the right midlung and right lower lung. Small left effusion. Mild interstitial edema identified. IMPRESSION: 1. Increase in volume of large right pleural effusion with associated decreased aeration to the right midlung and right lower lung. 2. Small left effusion. Electronically Signed   By: Kerby Moors M.D.   On: 07/06/2022 09:38    Scheduled Meds:  feeding supplement  237 mL Oral TID BM   hydrocerin   Topical BID   leptospermum manuka honey  1 Application Topical Daily    morphine injection  2 mg Intravenous Q4H   Continuous Infusions:     LOS: 20 days   Domenic Polite, MD [facility] 07/06/2022, 1:37 PM

## 2022-07-06 NOTE — Progress Notes (Signed)
Patient HR up 140s-160s sustaining in 140s, MD notified. Am cardizem and Eliquis given early. Will continue to monitor.

## 2022-07-06 NOTE — Plan of Care (Signed)
  Problem: Fluid Volume: Goal: Hemodynamic stability will improve Outcome: Progressing   Problem: Clinical Measurements: Goal: Will remain free from infection Outcome: Progressing   Problem: Clinical Measurements: Goal: Respiratory complications will improve Outcome: Progressing   Problem: Clinical Measurements: Goal: Cardiovascular complication will be avoided Outcome: Progressing

## 2022-07-06 NOTE — Progress Notes (Signed)
Patients HR goes to 140s-160s, but very briefly. Patient was also moaning, gave pain medication to see if it helps and will continue to monitor.

## 2022-07-07 ENCOUNTER — Encounter (HOSPITAL_COMMUNITY): Payer: Self-pay | Admitting: Internal Medicine

## 2022-07-07 DIAGNOSIS — G309 Alzheimer's disease, unspecified: Secondary | ICD-10-CM | POA: Diagnosis not present

## 2022-07-07 DIAGNOSIS — N179 Acute kidney failure, unspecified: Secondary | ICD-10-CM | POA: Diagnosis not present

## 2022-07-07 DIAGNOSIS — Z7189 Other specified counseling: Secondary | ICD-10-CM | POA: Diagnosis not present

## 2022-07-07 DIAGNOSIS — I4891 Unspecified atrial fibrillation: Secondary | ICD-10-CM

## 2022-07-07 DIAGNOSIS — G9340 Encephalopathy, unspecified: Secondary | ICD-10-CM | POA: Diagnosis not present

## 2022-07-07 DIAGNOSIS — I5033 Acute on chronic diastolic (congestive) heart failure: Secondary | ICD-10-CM | POA: Diagnosis not present

## 2022-07-07 DIAGNOSIS — J9601 Acute respiratory failure with hypoxia: Secondary | ICD-10-CM | POA: Diagnosis not present

## 2022-07-07 MED ORDER — MORPHINE BOLUS VIA INFUSION: 1.0000 mg | INTRAVENOUS | Status: DC | PRN

## 2022-07-07 MED ORDER — MORPHINE SULFATE (PF) 2 MG/ML IV SOLN: 2.0000 mg | INTRAVENOUS | Status: DC

## 2022-07-07 MED ORDER — MORPHINE 100MG IN NS 100ML (1MG/ML) PREMIX INFUSION
1.0000 mg/h | INTRAVENOUS | Status: DC
  Administered 2022-07-07: 1 mg/h via INTRAVENOUS
  Administered 2022-07-08: 2 mg/h via INTRAVENOUS
  Filled 2022-07-07 (×3): qty 100

## 2022-07-07 NOTE — Progress Notes (Signed)
Palliative Medicine Progress Note   Patient Name: [name] Dyer       Date: 07/07/2022 DOB: December 03, 1932  Age: 88 y.o. MRN#: [id number] Attending Physician: Domenic Polite, MD Primary Care Physician: [name], NP Admit Date: 06/18/2022  Reason for Follow-up: end of life care, symptom management  HPI/Patient Profile: 88 y.o. female  with past medical history of permanent A-fib, dementia, chronic congestive heart failure, CKD stage IIIb, and recent hospitalization for ESBL UTI  admitted on 06/05/2022 with poor PO intake. Chest x-ray showed cardiomegaly, moderate right-sided pleural effusion. Patient continues with FTT and poor PO intake. Patient has been declined for rehab as she is unable to participate. PMT consulted to [facility].   Patient was transitioned to full comfort care 7/12.   Subjective: Chart reviewed. Note events from overnight. [name] son [name] Dyer wanted to rescind comfort care as he was concerned that her medical conditions were not treated. Patient was seen by PCCM, who also recommends comfort care.   I met at bedside with Dr. Broadus John to assess patient and speak with family. Patient appears to be uncomfortable; she is grimacing and moaning. Provided education to family on non-verbal signs of pain/discomfort. Discussed the indications for opioids at end of life to reduce symptom burden. Family is ultimately agreeable to start a morphine drip.   I provided education and counseling at length on the natural trajectory at end of life. I was very frank with family that [name] Dyer is dying; they verbalize understanding and ask "how long". Discussed that it could be hours to days.   Son/[name] Dyer continues to be very concerned about patient's elevated heart rate. Discussed  that improved symptom management should help the heart rate.   Provided education and counseling on the concept of comfort care, specifically that the goal is to provide comfort and dignity while allowing the natural course to occur. Discussed that comfort care does not include interventions that will prolong the dying process. Emotional support provided throughout discussion.    Objective:  Physical Exam Vitals reviewed.  Constitutional:      General: She is in acute distress.     Appearance: She is ill-appearing.     Comments: Responds to pain moaning  Cardiovascular:     Rate and Rhythm: Tachycardia present.  Pulmonary:     Effort: Tachypnea present.  Vital Signs: BP 133/76 (BP Location: Left Arm)   Pulse (!) 126   Temp 98.3 F (36.8 C) (Oral)   Resp 20   Ht _0  (1.549 m)   Wt 66.2 kg   SpO2 95%   BMI 27.59 kg/m  SpO2: SpO2: 95 % O2 Device: O2 Device: Nasal Cannula O2 Flow Rate: O2 Flow Rate (L/min): 5 L/min    LBM: Last BM Date : 07/06/22     Palliative Assessment/Data: PPS 20%     Palliative Medicine Assessment & Plan   Assessment: Principal Problem:   Acute encephalopathy Active Problems:   Acute on chronic diastolic CHF (congestive heart failure) (HCC)   Permanent atrial fibrillation (HCC)   Hypertension   Alzheimer's disease ([place])   Acute renal failure superimposed on stage 3b chronic kidney disease (HCC)   Acute respiratory failure with hypoxia (HCC)   Sepsis due to gram-negative UTI (HCC)   Pressure injury of skin   Orthostatic hypotension   Acute urinary retention   Dysphagia   Thrombocytopenia (HCC)    Recommendations/Plan: Continue comfort focused care Start morphine drip Continue cardiac monitoring per family request PMT will continue to follow   Code Status: DNR/DNI as previously documented  Prognosis:  Hours - Days  Discharge Planning: Anticipated Hospital Death    Thank you for allowing the Palliative  Medicine Team to assist in the care of this patient.   Greater than 50%  of this time was spent counseling and coordinating care related to the above assessment and plan.  Total time: 90 minutes   Lavena Bullion, NP Palliative Medicine   Please contact Palliative Medicine Team phone at [phone] for questions and concerns.  For individual provider, see AMION.

## 2022-07-07 NOTE — Progress Notes (Signed)
Patient seen and examined, discussed with sons and daughter-in-law at bedside, after palliative care meeting and comfort care discussions she was seen by critical care overnight who also recommended comfort focused care. -Patient is resting, on as needed morphine, appears to be moaning, grimacing and uncomfortable -Palliative care assistance greatly appreciated, plan to start morphine gtt. -Dissipate hospital demise  Domenic Polite, MD

## 2022-07-07 NOTE — Consult Note (Signed)
NAME:  ALAINNA STAWICKI, MRN:  [id number], DOB:  1932/07/29, LOS: 21 ADMISSION DATE:  06/08/2022, CONSULTATION DATE:  7/10 REFERRING MD:  Maggie Font, CHIEF COMPLAINT:  Afib with RVR, hypotension   History of Present Illness:  Mrs. Meroney is an 88 year old woman with a history of Alzheimer's dementia, permanent atrial fibrillation, biventricular heart failure, CKD 3B, previous history of ESBL E. coli who presented on 06/17/2022 with fever and lethargy.  She has been living in a nursing home since discharge from the hospital in June after an admission from June 1 to June 09, 2022 for acute encephalopathy and sepsis due to UTI.  Since admission she was found to have hypervolemia and right pleural effusion due to acute on chronic HFpEF.  She has had poor oral intake and failure to thrive, which her son reports may have improved slightly in the last day or so.  She has an Enterococcus faecalis UTI and sepsis.  She has an unstageable sacral decubitus ulcer, urinary retention, dysphagia, and severe protein calorie malnutrition.  Due to progressive decline and frequent hospitalizations palliative care was consulted.  Hospice has been recommended due to progressive failure to thrive.  During the day on 7/9 the decision was made to pursue comfort care due to progressive decline and uncontrolled symptoms.  This evening her son has questions about why her cardiac medications have not been given.  PCCM was consulted for evaluation.  Her son is at bedside today.  She is unable to provide additional history.  She has been requiring morphine about every 2 hours to control her pain per her son.   Pertinent  Medical History  Dementia FTT Severe protein energy malnutrition Decubitus ulcers HFpEF Hypertension   Significant Hospital Events: Including procedures, antibiotic start and stop dates in addition to other pertinent events   6/19 admitted 7/9 transition to comfort care  Interim History / Subjective:     Objective   Blood pressure (!) 85/56, pulse (!) 144, temperature 98.3 F (36.8 C), temperature source Oral, resp. rate (!) 40, height _0  (1.549 m), weight 66.2 kg, SpO2 91 %.        Intake/Output Summary (Last 24 hours) at 07/07/2022 0245 Last data filed at 07/06/2022 2300 Gross per 24 hour  Intake 100 ml  Output 1150 ml  Net -1050 ml   Filed Weights   07/02/22 0300 07/05/22 0450 07/06/22 0400  Weight: 66.4 kg 71.2 kg 66.2 kg    Examination: General: Chronically ill-appearing woman lying in bed, continuously moaning in pain. HENT: [place]/AT Lungs: Tachypneic, shallow breaths.  No rhonchi. Cardiovascular: S1-S2, tachycardic, irregular rhythm.  Heart rate varying between 130-150s Abdomen: Tender to palpation.  Exam limited due to inability to reposition her right arm to fully performed abdominal exam due to uncontrolled pain in her crying out Extremities: Anasarca Neuro: Sleeping, only responses to painful stimulation.  No response to verbal stimulation. GU: Foley draining yellow urine Derm: pallor, no diffuse rashes  Culture data from this admission reviewed 7/9 labs reviewed-sodium 147 BUN 83 Creatinine 0.86 Recent BNP 1271.9  Resolved Hospital Problem list     Assessment & Plan:  A-fib with RVR - Limited by low blood pressure with rate control meds - Reviewed with her son that her tachycardia is not what is making her uncomfortable, and likely her tachycardia is worsened by uncontrolled pain.  In the stage of her life having tachycardia is unlikely to change her eventual outcome.  Most of the benefit of controlling her heart rate  with A-fib would be preventing long-term complications rather than changing her short-term prognosis. - No role for anticoagulation at this is  Severe protein energy malnutrition Failure to thrive Decubitus ulcer Alzheimer's dementia Dysphagia - Comfort care is appropriate at this phase - Agree with morphine as needed for pain.  May need  a higher dose if she is still requiring it every 2 hours, which her son has endorsed and her physical exam suggests.  She was examined about 2.5 hours after her most recent dose and her exam has been limited by uncontrolled pain.  Sepsis due to Enterococcus faecalis UTI Urinary retention - Unlikely to benefit from aggressive treatment at this point.  High risk for recurrent UTIs with Foley catheter placement and overall poor nutritional status and functional status  AKI - No role for additional monitoring  Acute on chronic HFpEF, biventricular heart failure - Ensure adequate pain control and control of symptoms to ensure no air hunger - Might benefit from additional diuresis, but this is unlikely to be effective with kidney injury.  Hyperglycemia - No additional monitoring  I updated her son at bedside and explained why aggressively treating her pain is the most appropriate thing to do at this stage.  She has had prolonged hospital admissions recently and overall has continued to decline.  She is actively dying.  Trying to control her heart rate will not improve her quality of life and would only be controlling numbers.  Her son understands that she is in uncontrolled pain and is okay with giving additional pain medication at this point given her uncontrolled pain.  Ongoing cardiac monitoring is unlikely to benefit her.  Recommend continuing focusing on her comfort.  Updated RN after speaking to her son and examining her.  Best Practice (right click and "Reselect all SmartList Selections" daily)   Per primary  Labs   CBC: Recent Labs  Lab 06/30/22 0720 07/02/22 0253 07/03/22 0333 07/05/22 0205  WBC 12.2* 12.6* 10.9* 10.7*  HGB 10.3* 9.9* 10.4* 10.8*  HCT 33.3* 32.6* 34.9* 36.1  MCV 92.5 93.7 93.6 93.8  PLT PLATELET CLUMPS NOTED ON SMEAR, UNABLE TO ESTIMATE 173 140* 161    Basic Metabolic Panel: Recent Labs  Lab 07/01/22 0209 07/02/22 0253 07/03/22 0333 07/05/22 0205  07/06/22 0225  NA 141 141 144 149* 147*  K 5.0 5.2* 4.8 5.1 4.8  CL 101 105 108 109 110  CO2 _0 GLUCOSE 130* 115* 114* 135* 127*  BUN 92* 107* 94* 88* 83*  CREATININE 1.22* 1.09* 0.93 0.92 0.86  CALCIUM 9.0 8.7* 8.9 9.1 8.7*   GFR: Estimated Creatinine Clearance: 39.4 mL/min (by C-G formula based on SCr of 0.86 mg/dL). Recent Labs  Lab 06/30/22 0720 07/02/22 0253 07/03/22 0333 07/05/22 0205  WBC 12.2* 12.6* 10.9* 10.7*    Liver Function Tests: No results for input(s): "AST", "ALT", "ALKPHOS", "BILITOT", "PROT", "ALBUMIN" in the last 168 hours. No results for input(s): "LIPASE", "AMYLASE" in the last 168 hours. No results for input(s): "AMMONIA" in the last 168 hours.  ABG    Component Value Date/Time   PHART 7.362 01/08/2022 1313   PCO2ART 36.4 01/08/2022 1313   PO2ART 167 (H) 01/08/2022 1313   HCO3 22.2 01/08/2022 1320   TCO2 24 01/08/2022 1320   ACIDBASEDEF 4.0 (H) 01/08/2022 1320   O2SAT 66.0 01/08/2022 1320     Coagulation Profile: No results for input(s): "INR", "PROTIME" in the last 168 hours.  Cardiac Enzymes: No results for input(s): "  CKTOTAL", "CKMB", "CKMBINDEX", "TROPONINI" in the last 168 hours.  HbA1C: Hgb A1c MFr Bld  Date/Time Value Ref Range Status  04/22/2022 09:40 AM 5.9 (H) 4.8 - 5.6 % Final    Comment:    (NOTE) Pre diabetes:          5.7%-6.4%  Diabetes:              >6.4%  Glycemic control for   <7.0% adults with diabetes     CBG: No results for input(s): "GLUCAP" in the last 168 hours.  Review of Systems:   Unable to be obtained due to mental status  Past Medical History:  She,  has a past medical history of Alzheimer disease ([place]), Anemia, Arrhythmia, Cataract, Colon polyps, Community acquired pneumonia (01/2012), Congestive heart failure ([place]), Hyperlipidemia, Hypertension, Memory difficulty (10/01/2016), Osteoporosis, Permanent atrial fibrillation ([place]), Pleural effusion, right (01/2012), SVT (supraventricular  tachycardia) ([place]) (01/2012), and UTI (lower urinary tract infection).   Surgical History:   Past Surgical History:  Procedure Laterality Date   ABDOMINAL HYSTERECTOMY  1983   APPENDECTOMY  1983   COLONOSCOPY     COLONOSCOPY N/A 06/10/2016   Procedure: COLONOSCOPY;  Surgeon: Manus Gunning, MD;  Location: [facility];  Service: Gastroenterology;  Laterality: N/A;   ESOPHAGOGASTRODUODENOSCOPY N/A 06/10/2016   Procedure: ESOPHAGOGASTRODUODENOSCOPY (EGD);  Surgeon: Manus Gunning, MD;  Location: [facility];  Service: Gastroenterology;  Laterality: N/A;   HEMIARTHROPLASTY HIP  2011   Left femoral neck fracture   RIGHT/LEFT HEART CATH AND CORONARY ANGIOGRAPHY N/A 01/08/2022   Procedure: RIGHT/LEFT HEART CATH AND CORONARY ANGIOGRAPHY;  Surgeon: Leonie Man, MD;  Location: [facility];  Service: Cardiovascular;  Laterality: N/A;   UPPER GASTROINTESTINAL ENDOSCOPY       Social History:   reports that she has never smoked. She has never used smokeless tobacco. She reports that she does not drink alcohol and does not use drugs.   Family History:  Her family history includes Cancer in her grandchild; Colon polyps in her son; Heart Problems in her father and mother; Heart disease in her son; Neuropathy in her son; Osteoporosis in her mother; Other (age of onset: 35) in her brother; Parkinson's disease in her son; Stroke in her father.   Allergies Allergies  Allergen Reactions   Ciprofloxacin Diarrhea   Sulfa Antibiotics Hives, Rash and Other (See Comments)    Severe Rash   Nitrofurantoin Rash and Other (See Comments)    Developed severe rash on both legs   Memantine Other (See Comments)    Dizziness. Pt takes this medication at home   Succinylsulphathiazole Rash   Sulfamethoxazole Rash     Home Medications  Prior to Admission medications   Medication Sig Start Date End Date Taking? Authorizing Provider  acetaminophen (TYLENOL) 500 MG tablet Take 1,000 mg by  mouth every 6 (six) hours as needed for mild pain or headache.    Yes [provider]  allopurinol (ZYLOPRIM) 100 MG tablet Take 1 tablet (100 mg total) by mouth daily. 02/10/22  Yes Ngetich, [name] C, NP  atenolol (TENORMIN) 50 MG tablet Take 1 tablet (50 mg total) by mouth daily. Patient taking differently: Take 25-50 mg by mouth See admin instructions. Take 50 mg by mouth in the morning and 25 mg at bedtime (HOLD FOR A SYSTOLIC READING OF LESS THAN 100 OR HEART RATE LESS THAN 60) 05/23/22  Yes Ngetich, [name] C, NP  cholecalciferol (VITAMIN D3) 25 MCG (1000 UT) tablet Take 2,000 Units  by mouth daily.   Yes [provider]  docusate sodium (COLACE) 100 MG capsule Take 1 capsule (100 mg total) by mouth 2 (two) times daily. 06/06/22  Yes Oswald Hillock, MD  donepezil (ARICEPT) 10 MG tablet Take 1 tablet (10 mg total) by mouth at bedtime. 07/02/21  Yes Suzzanne Cloud, NP  furosemide (LASIX) 20 MG tablet Take 1 tablet (20 mg total) by mouth daily. 06/10/22  Yes Oswald Hillock, MD  hydrochlorothiazide (HYDRODIURIL) 25 MG tablet Take 25 mg by mouth daily.   Yes [provider]  hydrocortisone (ANUSOL-HC) 25 MG suppository Place 1 suppository (25 mg total) rectally 2 (two) times daily as needed for hemorrhoids or anal itching. 06/06/22  Yes Lama, Marge Duncans, MD  ipratropium (ATROVENT) 0.06 % nasal spray CAN USE 2 SPRAYS IN EACH NOSTRIL EVERY 6 HOURS IF NEEDED TO DRY UP RUNNY N0SE Patient taking differently: Place 2 sprays into both nostrils every 6 (six) hours as needed for rhinitis. 12/04/20  Yes Kozlow, Donnamarie Poag, MD  levalbuterol Penne Lash) 0.63 MG/3ML nebulizer solution Take by nebulization See admin instructions. Per MAR: "One inhalation 2 times a day" 06/13/22 06/17/22 Yes [provider]  memantine (NAMENDA) 5 MG tablet Take 1 tablet (5 mg total) by mouth 2 (two) times daily. 07/02/21  Yes Suzzanne Cloud, NP  Menthol, Topical Analgesic, (ICY HOT BACK EX) Apply 1 application  topically See  admin instructions. Apply to both feet 2 times a day   Yes [provider]  Multiple Vitamin-Folic Acid TABS Take 1 tablet by mouth daily with breakfast.   Yes [provider]  omeprazole (PRILOSEC) 20 MG capsule Take 1 capsule (20 mg total) by mouth daily. 05/09/22  Yes Ngetich, [name] C, NP  Rivaroxaban (XARELTO) 15 MG TABS tablet TAKE 1 TABLET (15 MG TOTAL) BY MOUTH DAILY WITH SUPPER. Patient taking differently: Take 15 mg by mouth daily with supper. 01/08/22  Yes [name], MD  rosuvastatin (CRESTOR) 5 MG tablet Take 1 tablet (5 mg total) by mouth daily. Patient taking differently: Take 5 mg by mouth at bedtime. 02/10/22  Yes [name], MD  traZODone (DESYREL) 50 MG tablet Take 50 mg by mouth at bedtime as needed for sleep.   Yes [provider]  TUMS 500 MG chewable tablet Chew 3 tablets by mouth daily.   Yes [provider]  vitamin B-12 (CYANOCOBALAMIN) 1000 MCG tablet Take 1,000 mcg by mouth daily.   Yes [provider]  vitamin C (ASCORBIC ACID) 250 MG tablet Take 500 mg by mouth daily.   Yes [provider]  alendronate (FOSAMAX) 70 MG tablet TAKE 1 TABLET (70 MG TOTAL) BY MOUTH ONCE A WEEK 06/27/22   Ngetich, [name] C, NP  calcium carbonate (OS-CAL - DOSED IN MG OF ELEMENTAL CALCIUM) 1250 (500 Ca) MG tablet Take 1 tablet (1,250 mg total) by mouth in the morning. Patient not taking: Reported on 06/09/2022 01/18/22   Margie Billet, PA-C  colchicine 0.6 MG tablet Take 2 tablets by mouth x 1 dose then one tablet daily x 3 days wait 12 hours prior to resuming Allopurinol Patient not taking: Reported on 06/01/2022 04/28/22   Ngetich, [name] C, NP  divalproex (DEPAKOTE SPRINKLE) 125 MG capsule Take 2 capsules (250 mg total) by mouth 2 (two) times daily. Patient not taking: Reported on 05/31/2022 06/06/22   Oswald Hillock, MD  saccharomyces boulardii (FLORASTOR) 250 MG capsule Take 1 capsule (250 mg total) by mouth  2 (two) times daily. For 10  days. Patient not taking: Reported on 06/17/2022 05/28/22   Ngetich, [name], NP     Critical care time: York Emerick Weatherly, DO 07/07/22 3:02 AM [facility]

## 2022-07-07 NOTE — Progress Notes (Addendum)
Overnight progress note  88 year old female with dementia, encephalopathy, ongoing cognitive decline, acute on chronic CHF, permanent A-fib with RVR, suspected sepsis, dysphagia, severe malnutrition, large unstageable sacral decubitus wound, hypotension with ongoing decline, vital signs unstable this morning.  Several physicians have recommended hospice and comfort care to the patient's family this admission.  Palliative care team had a discussion with the patient's family yesterday morning and she was transitioned to full comfort care.  Medications were stopped and she was started on morphine as needed.  Notified by RN that patient's son does not want her to be comfort care.  I spoke to the patient's son [name], although he agrees with DNR/DNI status, he is concerned that the patient is not being given medications to treat her medical conditions.  He is particularly concerned about patient not receiving her heart medication Cardizem.  Also he wants to keep her on cardiac monitoring with frequent monitoring of vital signs.  I have explained to him that Cardizem cannot be given as the patient is hypotensive.  I have explained to him that patient has reached end-of-life and there is not much we can do at this point to improve her condition.  Son is interested in seeking second opinion from cardiology.  I have paged cardiology twice, awaiting callback.  Spoke to on-call physician with PCCM who will also have a discussion with the patient's son.   Addendum 07/07/2022 at 2:46 AM: Spoke to on-call cardiologist who will also speak to the patient's son.  Appreciate assistance from [facility] and cardiology.

## 2022-07-08 DIAGNOSIS — G9340 Encephalopathy, unspecified: Secondary | ICD-10-CM | POA: Diagnosis not present

## 2022-07-08 DIAGNOSIS — R1310 Dysphagia, unspecified: Secondary | ICD-10-CM

## 2022-07-08 NOTE — Progress Notes (Signed)
PROGRESS NOTE  [name] Dyer  IFO:[id number] DOB: 20-Jun-1932 DOA: 06/08/2022 PCP: [name], NP   Brief Narrative:  Patient is a 88 year old female with history of permanent A-fib, dementia, chronic congestive heart failure, CKD stage IIIb, recent hospitalization for ESBL UTI and was discharged to skilled nursing facility for severe deconditioning presented back with very poor oral intake, increased oxygen requirement.  She was found to be volume overloaded on presentation, tachycardic.  Chest x-ray showed cardiomegaly, moderate right-sided pleural effusion.  Hospital course remarkable for prolonged stay with failure to thrive, poor oral intake.  Goals of care discussed and patient made DNR.   -Subsequently had continued decline with worsening encephalopathy, A-fib RVR and hypotension, seen by palliative care, now comfort care  Assessment & Plan:  Acute metabolic encephalopathy/goals of care/ Dementia  multifactorial secondary to delirium, dehydration, hypoxemia, hyponatremia, failure to thrive, poor oral intake.  -Poorly interactive, minimal oral intake -Followed by palliative care this admission -Myself as well as multiple prior attending MDs have recommended hospice and comfort focused care to patient's family -Palliative care following, now comfort care, on morphine gtt., anticipate hospital demise  AKI on CKD stage IIIb/hypernatremia/electrolyte abnormalities:  -She is volume overloaded with third spacing but intravascularly depleted, sodium mildly elevated, off IV fluids  Suspected sepsis: Blood cultures from different dates have grown Staph epidermidis, staph hominis in 1 out of 2 sets.  Likely contamination.  Urine culture showed 10,000 colonies of Enterococcus faecalis.  Antibiotics discontinued by my partner few days ago, mild leukocytosis  Acute on chronic diastolic congestive heart failure: Hypoalbuminemia, third spacing - Echo in January 2023 showed preserved  ejection fraction with mild reduction in the right ventricular systolic function.   -Now comfort care  Permanent A-fib: Rate fluctuating.  Remains in A-fib.  She has required IV Cardizem for rate control during this hospitalization.  Xarelto switched to Eliquis for anticoagulation.  Cardiology recommended to change to oral Cardizem.  Now comfort care  Unstageable large sacral decubitus wound -Continue wound care -Poor prognosis  Severe protein calorie malnutrition  Urine retention: Foley was placed during this hospitalization  Dysphagia:   Disposition/goals of care:  89/F with dementia, CHF, encephalopathy, ongoing cognitive decline, A-fib RVR, severe malnutrition, large unstageable sacral decubitus wound, hypotension with ongoing decline, vitals unstable this morning, many of my partners have recommended hospice and comfort care to patient's family this admission, I called and discussed with patient's son [name] 7/9, reiterated poor prognosis recommended comfort focused care, palliative team was able to discuss with patient's family and she was transitioned to comfort care   . Pressure Injury 06/17/22 Coccyx Mid;Lower Unstageable - Full thickness tissue loss in which the base of the injury is covered by slough (yellow, tan, gray, green or brown) and/or eschar (tan, brown or black) in the wound bed. (Active)  06/17/22 1500  Location: Coccyx  Location Orientation: Mid;Lower  Staging: Unstageable - Full thickness tissue loss in which the base of the injury is covered by slough (yellow, tan, gray, green or brown) and/or eschar (tan, brown or black) in the wound bed.  Wound Description (Comments):   Present on Admission: Yes        Nutrition Problem: Inadequate oral intake Etiology: dysphagia, lethargy/confusion Pressure Injury 06/17/22 Coccyx Mid;Lower Unstageable - Full thickness tissue loss in which the base of the injury is covered by slough (yellow, tan, gray, green or brown)  and/or eschar (tan, brown or black) in the wound bed. (Active)  06/17/22 1500  Location: Coccyx  Location Orientation: Mid;Lower  Staging: Unstageable - Full thickness tissue loss in which the base of the injury is covered by slough (yellow, tan, gray, green or brown) and/or eschar (tan, brown or black) in the wound bed.  Wound Description (Comments):   Present on Admission: Yes  Dressing Type Foam - Lift dressing to assess site every shift 07/08/22 0900    DVT prophylaxis:     Code Status: DNR  Family Communication: Both sons at bedside    Consultants: Palliative care  Procedures: None  Antimicrobials:  Anti-infectives (From admission, onward)    Start     Dose/Rate Route Frequency Ordered Stop   06/20/2022 2030  meropenem (MERREM) 1 g in sodium chloride 0.9 % 100 mL IVPB  Status:  Discontinued        1 g 200 mL/hr over 30 Minutes Intravenous Every 12 hours 06/03/2022 2016 06/18/22 1927   06/11/2022 1815  piperacillin-tazobactam (ZOSYN) IVPB 3.375 g        3.375 g 100 mL/hr over 30 Minutes Intravenous  Once 06/01/2022 1813 06/10/2022 1938   06/01/2022 1600  ceFEPIme (MAXIPIME) 2 g in sodium chloride 0.9 % 100 mL IVPB  Status:  Discontinued        2 g 200 mL/hr over 30 Minutes Intravenous  Once 06/09/2022 1552 06/23/2022 1609   06/06/2022 1600  metroNIDAZOLE (FLAGYL) IVPB 500 mg  Status:  Discontinued        500 mg 100 mL/hr over 60 Minutes Intravenous  Once 06/25/2022 1552 06/01/2022 1609   06/05/2022 1600  vancomycin (VANCOCIN) IVPB 1000 mg/200 mL premix  Status:  Discontinued        1,000 mg 200 mL/hr over 60 Minutes Intravenous  Once 06/03/2022 1552 06/02/2022 1609       Subjective: Transferred to [address] yesterday, son was upset about not having continuous wall telemetry monitoring, now back on telemetry  Objective: Vitals:   07/08/22 0359 07/08/22 0514 07/08/22 0629 07/08/22 0900  BP: 115/65 90/61 100/61 92/61  Pulse: (!) 175 (!) 161 77 (!) 127  Resp: _0 Temp: 98.4 F (36.9  C) 99.1 F (37.3 C)  99.1 F (37.3 C)  TempSrc: Oral Oral  Axillary  SpO2: 90% (!) 79% 100% 98%  Weight:      Height:        Intake/Output Summary (Last 24 hours) at 07/08/2022 1627 Last data filed at 07/08/2022 0900 Gross per 24 hour  Intake 30 ml  Output 600 ml  Net -570 ml   Filed Weights   07/02/22 0300 07/05/22 0450 07/06/22 0400  Weight: 66.4 kg 71.2 kg 66.2 kg    Examination:  General exam: Elderly frail chronically ill female laying in bed poorly responsive, with some agonal breathing CVS: S1-S2, tachycardic Lungs: Few conducted upper airway sounds  Extremities with 2+ edema   Data Reviewed: I have personally reviewed following labs and imaging studies  CBC: Recent Labs  Lab 07/02/22 0253 07/03/22 0333 07/05/22 0205  WBC 12.6* 10.9* 10.7*  HGB 9.9* 10.4* 10.8*  HCT 32.6* 34.9* 36.1  MCV 93.7 93.6 93.8  PLT 173 140* 233   Basic Metabolic Panel: Recent Labs  Lab 07/02/22 0253 07/03/22 0333 07/05/22 0205 07/06/22 0225  NA 141 144 149* 147*  K 5.2* 4.8 5.1 4.8  CL 105 108 109 110  CO2 _1 GLUCOSE 115* 114* 135* 127*  BUN 107* 94* 88* 83*  CREATININE 1.09* 0.93 0.92 0.86  CALCIUM 8.7* 8.9  9.1 8.7*     No results found for this or any previous visit (from the past 240 hour(s)).    Radiology Studies: No results found.  Scheduled Meds:  feeding supplement  237 mL Oral TID BM   hydrocerin   Topical BID   leptospermum manuka honey  1 Application Topical Daily   Continuous Infusions:  morphine 2 mg/hr (07/08/22 0416)      LOS: 22 days   Domenic Polite, MD [facility] 07/08/2022, 4:27 PM

## 2022-07-08 NOTE — Progress Notes (Addendum)
OVERNIGHT PROGRESS REPORT  Notified by assigned RN for patient family concern over monitoring. Spoke with [name]. He is aware of degree of illness of this patient but is having some difficulty with the path of treatment as currently in place.  Verified patient is to remain as comfort measures. Family member does definitely want to return to previously assigned floor and this is verified with telephone conversation (remote location floor coverage) He is also at odds somewhat with the current track.  Conversation appears to be approximately equivalent to previous conversations and concerns.  Patient will be ordered return to previous floor with telemetry monitoring for the immediate time. Palliative care is previously consulted. Code status remains intact.  Update: 2549 Hrs   Spoke with AC no available other floors currently due to Hospital census. RN will conduct more frequent vitals in meantime.   Gershon Cull MSNA MSN [id number] Acute Care Nurse Practitioner [place]

## 2022-07-08 NOTE — Progress Notes (Signed)
This RN called 3E for report but was told that bed has been assigned to another, family member notified. Son insisted on going to another floor. CN notified and she also notified the on-call. We continue to monitor.

## 2022-07-08 NOTE — Progress Notes (Signed)
Patient arrived on the unit with son at bedside, she was not responsive but was resting comfortably with no sign of pain. She was on a morphine gtt at 2.5 ml/hr. [name] son requested for patient to be on a wall monitor so that he can see her HR and RR. This RN explained to him that we don't have wall monitor on this unit but this RN set up a continues SPO2T monitor at the bedside. [name] son complained that he doesn't want his mom on this unit because there is no wall monitor and that he want to go back to the unit where they came from. CN notified and the CN contacted the on-call. On-call order for patient to go back to 3E. We continue to monitor.

## 2022-07-08 NOTE — Progress Notes (Signed)
Palliative Medicine Progress Note   Patient Name: [name] Dyer       Date: 07/08/2022 DOB: 1932-01-30  Age: 88 y.o. MRN#: [id number] Attending Physician: Domenic Polite, MD Primary Care Physician: [name], NP Admit Date: 06/25/2022  Reason for Consultation/Follow-up: end of life care  HPI/Patient Profile: 88 y.o. female  with past medical history of permanent A-fib, dementia, chronic congestive heart failure, CKD stage IIIb, and recent hospitalization for ESBL UTI  admitted on 06/21/2022 with poor PO intake. Chest x-ray showed cardiomegaly, moderate right-sided pleural effusion. Patient continues with FTT and poor PO intake. Patient has been declined for rehab as she is unable to participate. PMT consulted to [facility].    Patient was transitioned to full comfort care 7/12.   Subjective: Chart reviewed. Note events from overnight. When patient was transferred to [facility], son [name] was upset that they do not utilize monitors in the room and requested transfer back to previous floor however no beds were available.  [name] also requested oxygen be increased due to to low oxygen saturation.   I went to see patient at bedside. She is on a NRB mask. She remains on morphine infusion. Respirations appear agonal, but she otherwise appears comfortable. Moderate respiratory secretions noted.   I again provided education on the natural trajectory at end of life. I gently stated that patient appears to be actively dying and advised not transferring at this point. [name] is agreeable for patient to remain on 6N; however he continues to struggle with the concept of comfort care. He expresses concern that morphine has hastened the process.  I again provided education on the indications for opioids to  relieve pain and suffering at end of life. [name] perseverates on patient's low oxygen saturation. I explained that oxygen saturation is expected to be low when a patient is dying, which is why it is unnecessary to frequently monitor vital signs as part of comfort care. I also explained that the NRB mask is only serving to prolong the dying process.  Emotional support provided throughout discussion.   Objective:  Physical Exam Vitals reviewed.  Constitutional:      Appearance: She is ill-appearing.  Pulmonary:     Comments: Agonal respirations Neurological:     Mental Status: She is unresponsive.  Vital Signs: BP 92/61 (BP Location: Left Arm)   Pulse (!) 127   Temp 99.1 F (37.3 C) (Axillary)   Resp 15   Ht _0  (1.549 m)   Wt 66.2 kg   SpO2 98%   BMI 27.59 kg/m  SpO2: SpO2: 98 % O2 Device: O2 Device: Partial Rebreather Mask O2 Flow Rate: O2 Flow Rate (L/min): 15 L/min    LBM: Last BM Date : 07/06/22     Palliative Assessment/Data: PPS 10%     Palliative Medicine Assessment & Plan   Assessment: Principal Problem:   Acute encephalopathy Active Problems:   Acute on chronic diastolic CHF (congestive heart failure) (HCC)   Permanent atrial fibrillation (HCC)   Hypertension   Alzheimer's disease ([place])   Acute renal failure superimposed on stage 3b chronic kidney disease ([place])   Acute respiratory failure with hypoxia (HCC)   Sepsis due to gram-negative UTI (HCC)   Pressure injury of skin   Orthostatic hypotension   Acute urinary retention   Dysphagia   Thrombocytopenia (HCC)    Recommendations/Plan: Continue comfort focused care Continue morphine infusion Cancel transfer back to 3E Patient is actively dying - prognosis of hours    Code Status: DNR/DNI as previously documented    Care plan was discussed with Dr. Broadus John and bedside RN  Thank you for allowing the Palliative Medicine Team to assist in the care of this patient.   MDM -  high   Lavena Bullion, NP   Please contact Palliative Medicine Team phone at [phone] for questions and concerns.  For individual providers, please see AMION.

## 2022-07-08 NOTE — Progress Notes (Signed)
Patient O2 sat dropped to 78%, 79%, on 5L [place], son requested NRB mask, on-call notified. On-call recommended the green tubing hight flow but son insisted that he wanted the NRB mask. This RN gave patient the NRB mask per son request and on-call made aware. Patient now sating at 100%. We continue to monitor.

## 2022-07-08 NOTE — Progress Notes (Signed)
0750- [name] HR sustaining 160s-180. And wearing a non-rebreather mask at 15 L.  Educated family and son on the use of morphine drip to manage patient HR while on comfort care. Son is adamant about keeping the morphine drip at a rate of 2 mg/hr. Son states that the morphine "is what got her in this situation and drops her respirations to 5 per minute". RN continued to educate son on the course of care for a comfort patient, son requests to leave the non-rebreather mask on the patient and to keep the morphine drip at 2 mg/hr.

## 2022-07-09 ENCOUNTER — Telehealth: Payer: Self-pay

## 2022-07-23 ENCOUNTER — Other Ambulatory Visit: Payer: Self-pay | Admitting: Neurology

## 2022-07-29 NOTE — Progress Notes (Signed)
OVERNIGHT PROGRESS REPORT  Notified by RN that patient has expired at 0005 hrs.  Patient was DNR/comfort care.  2 RN verified.  Family was present and available to RN.     Gershon Cull MSNA ACNPC-AG Acute Care Nurse Practitioner [place]

## 2022-07-29 NOTE — Progress Notes (Signed)
[name] son is asking that HR is around 70-90, what if to stop morphine IV, will she be more awake and no pain. Told him that I'm not sure about her being more awake, but she will be more in pain. Educate more about comfort care measure.

## 2022-07-29 NOTE — Death Summary Note (Signed)
DEATH SUMMARY   Patient Details  Name: [name] Dyer MRN: [id number] DOB: [birth year] [name], [name], NP Admission/Discharge Information   Admit Date:  Jun 24, 2022  Date of Death: Date of Death: 2022-07-17  Time of Death: Time of Death: 02-10-23  Length of Stay: [facility] Diagnoses: Principal Problem:   Acute encephalopathy Dementia Unstageable large sacral decubitus wound Severe protein calorie malnutrition   Acute renal failure superimposed on stage 3b chronic kidney disease (HCC)   Sepsis due to gram-negative UTI (HCC)   Acute on chronic diastolic CHF (congestive heart failure) (HCC)   Acute respiratory failure with hypoxia (HCC)   Alzheimer's disease ([place])   Hypertension   Permanent atrial fibrillation ([place])   Acute urinary retention   Dysphagia   Orthostatic hypotension   Thrombocytopenia [facility])   Hospital Course: Acute metabolic encephalopathy/goals of care/ Dementia  multifactorial secondary to delirium, dehydration, hypoxemia, hyponatremia, failure to thrive, poor oral intake.  -Poorly interactive, minimal oral intake -Followed by palliative care this admission -Myself as well as multiple prior attending MDs have recommended hospice and comfort focused care to patient's family -Palliative care following, now comfort care, started on morphine gtt., expired 2023-07-18   AKI on CKD stage IIIb/hypernatremia/electrolyte abnormalities:  -She is volume overloaded with third spacing but intravascularly depleted, sodium mildly elevated, off IV fluids   Suspected sepsis: Blood cultures from different dates have grown Staph epidermidis, staph hominis in 1 out of 2 sets.  Likely contamination.  Urine culture showed 10,000 colonies of Enterococcus faecalis.  Antibiotics discontinued by my partner few days ago, mild leukocytosis   Acute on chronic diastolic congestive heart failure: Hypoalbuminemia, third spacing - Echo in January 2023 showed preserved ejection fraction  with mild reduction in the right ventricular systolic function.   -Now comfort care   Permanent A-fib: Rate fluctuating.  Remains in A-fib.  She has required IV Cardizem for rate control during this hospitalization.  Xarelto switched to Eliquis for anticoagulation.  Cardiology recommended to change to oral Cardizem.  Now comfort care   Unstageable large sacral decubitus wound -Continue wound care -Poor prognosis   Severe protein calorie malnutrition   Urine retention: Foley was placed during this hospitalization   Dysphagia:    Disposition/goals of care:  88/F with dementia, CHF, encephalopathy, ongoing cognitive decline, A-fib RVR, severe malnutrition, large unstageable sacral decubitus wound, hypotension with ongoing decline, many of my partners had recommended hospice and comfort care to patient's family this admission, I called and discussed with patient's son [name] Dyer 7/9, reiterated poor prognosis recommended comfort focused care, palliative team was able to discuss with patient's family and she was transitioned to comfort care, expired on July 18, 2023     . Pressure Injury 06/17/22 Coccyx Mid;Lower Unstageable - Full thickness tissue loss in which the base of the injury is covered by slough (yellow, tan, gray, green or brown) and/or eschar (tan, brown or black) in the wound bed. (Active)  06/17/22 1500  Location: Coccyx  Location Orientation: Mid;Lower  Staging: Unstageable - Full thickness tissue loss in which the base of the injury is covered by slough (yellow, tan, gray, green or brown) and/or eschar (tan, brown or black) in the wound bed.  Wound Description (Comments):   Present on Admission: Yes            Nutrition Problem: Inadequate oral intake Etiology: dysphagia, lethargy/confusion     Pressure Injury 06/17/22 Coccyx Mid;Lower Unstageable - Full thickness tissue loss in which the base of the  injury is covered by slough (yellow, tan, gray, green or brown) and/or eschar (tan,  brown or black) in the wound bed. (Active)  06/17/22 1500  Location: Coccyx  Location Orientation: Mid;Lower  Staging: Unstageable - Full thickness tissue loss in which the base of the injury is covered by slough (yellow, tan, gray, green or brown) and/or eschar (tan, brown or black) in the wound bed.  Wound Description (Comments):   Present on Admission: Yes  Dressing Type Foam - Lift dressing to assess site every shift 07/08/22 0900          The results of significant diagnostics from this hospitalization (including imaging, microbiology, ancillary and laboratory) are listed below for reference.   Significant Diagnostic Studies: DG CHEST PORT 1 VIEW  Result Date: 07/06/2022 CLINICAL DATA:  Hypoxia. EXAM: PORTABLE CHEST 1 VIEW COMPARISON:  06/26/2022 FINDINGS: Cardiac enlargement. There is a large right pleural effusion, which has increased in volume from previous exam. There is associated decreased aeration to the right midlung and right lower lung. Small left effusion. Mild interstitial edema identified. IMPRESSION: 1. Increase in volume of large right pleural effusion with associated decreased aeration to the right midlung and right lower lung. 2. Small left effusion. Electronically Signed   By: Kerby Moors M.D.   On: 07/06/2022 09:38   DG CHEST PORT 1 VIEW  Result Date: 06/26/2022 CLINICAL DATA:  Community acquired pneumonia EXAM: PORTABLE CHEST 1 VIEW COMPARISON:  06/25/2022 FINDINGS: Cardiomegaly. Airspace disease in the mid and lower right lung slightly improved since prior study. Patchy airspace disease on the left, unchanged. Suspect small right pleural effusion. No acute bony abnormality. IMPRESSION: Patchy bilateral airspace disease, right greater than left, improving on the right since prior study. Small right pleural effusion. Electronically Signed   By: Rolm Baptise M.D.   On: 06/26/2022 22:02   DG CHEST PORT 1 VIEW  Result Date: 06/25/2022 CLINICAL DATA:  CHF EXAM:  PORTABLE CHEST 1 VIEW COMPARISON:  05/31/2022 FINDINGS: Cardiomegaly. Small bilateral pleural effusions. Right greater than left airspace disease is similar prior study. No acute bony abnormality. IMPRESSION: Small bilateral effusions with asymmetric airspace disease, right greater than left. Favor asymmetric edema. Findings similar to prior study. Electronically Signed   By: Rolm Baptise M.D.   On: 06/25/2022 22:35   VAS [place] UPPER EXTREMITY VENOUS DUPLEX  Result Date: 06/22/2022 UPPER VENOUS STUDY  Patient Name:  JEREMY MCLAMB  Date of Exam:   06/22/2022 Medical Rec #: [id number]          Accession #:    [id number] Date of Birth: Feb 26, 1932          Patient Gender: F Patient Age:   68 years Exam Location:  [facility] Procedure:      VAS [place] UPPER EXTREMITY VENOUS DUPLEX Referring Phys: Murray Hodgkins --------------------------------------------------------------------------------  Indications: Edema Risk Factors: Advanced dementia. Limitations: Edema, pain with compression of left arm. Comparison Study: No prior study on file Performing Technologist: Sharion Dove RVS  Examination Guidelines: A complete evaluation includes B-mode imaging, spectral Doppler, color Doppler, and power Doppler as needed of all accessible portions of each vessel. Bilateral testing is considered an integral part of a complete examination. Limited examinations for reoccurring indications may be performed as noted.  Right Findings: +----------+------------+---------+-----------+----------+-------------------+ RIGHT     CompressiblePhasicitySpontaneousProperties      Summary       +----------+------------+---------+-----------+----------+-------------------+ IJV           Full  pulsatile waveforms +----------+------------+---------+-----------+----------+-------------------+ Subclavian    Full                                  pulsatile waveforms  +----------+------------+---------+-----------+----------+-------------------+ Axillary                                            pulsatile waveforms +----------+------------+---------+-----------+----------+-------------------+ Brachial      Full                                  pulsatile waveforms +----------+------------+---------+-----------+----------+-------------------+ Radial        Full                                                      +----------+------------+---------+-----------+----------+-------------------+ Ulnar                                                 Not visualized    +----------+------------+---------+-----------+----------+-------------------+ Cephalic      Full                                                      +----------+------------+---------+-----------+----------+-------------------+ Basilic       Full                                                      +----------+------------+---------+-----------+----------+-------------------+  Left Findings: +----------+------------+---------+-----------+----------+-------------------+ LEFT      CompressiblePhasicitySpontaneousProperties      Summary       +----------+------------+---------+-----------+----------+-------------------+ IJV                                                 pulsatile waveforms +----------+------------+---------+-----------+----------+-------------------+ Subclavian                                          pulsatile waveforms +----------+------------+---------+-----------+----------+-------------------+ Axillary                                            pulsatile waveforms +----------+------------+---------+-----------+----------+-------------------+ Brachial      Full                                                      +----------+------------+---------+-----------+----------+-------------------+  Radial        Full                                                       +----------+------------+---------+-----------+----------+-------------------+ Ulnar         Full                                                      +----------+------------+---------+-----------+----------+-------------------+ Cephalic      Full                                                      +----------+------------+---------+-----------+----------+-------------------+ Basilic       Full                                                      +----------+------------+---------+-----------+----------+-------------------+  Summary:  Right: No evidence of deep vein thrombosis in the upper extremity. No evidence of superficial vein thrombosis in the upper extremity. Pulsatile waveforms suggestive of fluid overload. Significant interstitial edema noted throughout.  Left: No evidence of deep vein thrombosis in the upper extremity. No evidence of superficial vein thrombosis in the upper extremity. Pulsatile waveforms suggestive of fluid overload. Significant interstitial edema noted throughout.  *See table(s) above for measurements and observations.  Diagnosing physician: Monica Martinez MD Electronically signed by Monica Martinez MD on 06/22/2022 at 11:40:13 AM.    Final    DG Chest Portable 1 View  Result Date: 06/25/2022 CLINICAL DATA:  Shortness of breath EXAM: PORTABLE CHEST 1 VIEW COMPARISON:  04/25/2022 FINDINGS: Bilateral pleural effusions, right greater than left. Cardiomegaly with vascular congestion. Airspace disease at the bases. Asymmetric hazy opacity right thorax. No visible pneumothorax. IMPRESSION: 1. Cardiomegaly with vascular congestion bilateral pleural effusions. Airspace disease at the bases could be due to atelectasis or pneumonia 2. Hazy asymmetric opacity right thorax could be due to pleural effusion versus asymmetric edema Electronically Signed   By: Donavan Foil M.D.   On: 06/12/2022 17:51    Microbiology: No results  found for this or any previous visit (from the past 240 hour(s)).  Time spent: 35 minutes  Signed: Domenic Polite, MD

## 2022-07-29 NOTE — Telephone Encounter (Signed)
Transition Care Management Unsuccessful Follow-up Telephone Call  Date of discharge and from where:  06/09/2022  Attempts:  1st Attempt  Reason for unsuccessful TCM follow-up call:  Unable to reach patient as patient has expired on 2022/07/13

## 2022-07-29 DEATH — deceased

## 2022-08-09 ENCOUNTER — Other Ambulatory Visit: Payer: Self-pay | Admitting: Family

## 2022-10-16 ENCOUNTER — Ambulatory Visit: Payer: Medicare Other | Admitting: Family
